# Patient Record
Sex: Female | Born: 1945
Health system: Southern US, Community
[De-identification: ages and names within clinical notes are randomized; demographics above are authoritative.]

## PROBLEM LIST (undated history)

## (undated) ENCOUNTER — Emergency Department (HOSPITAL_COMMUNITY): Admission: EM | Payer: Medicare Other

## (undated) DIAGNOSIS — C50919 Malignant neoplasm of unspecified site of unspecified female breast: Secondary | ICD-10-CM

## (undated) DIAGNOSIS — K589 Irritable bowel syndrome without diarrhea: Secondary | ICD-10-CM

## (undated) DIAGNOSIS — K219 Gastro-esophageal reflux disease without esophagitis: Secondary | ICD-10-CM

## (undated) DIAGNOSIS — R251 Tremor, unspecified: Secondary | ICD-10-CM

## (undated) DIAGNOSIS — E669 Obesity, unspecified: Secondary | ICD-10-CM

## (undated) DIAGNOSIS — C801 Malignant (primary) neoplasm, unspecified: Secondary | ICD-10-CM

## (undated) DIAGNOSIS — I10 Essential (primary) hypertension: Secondary | ICD-10-CM

## (undated) DIAGNOSIS — F419 Anxiety disorder, unspecified: Secondary | ICD-10-CM

## (undated) DIAGNOSIS — C8590 Non-Hodgkin lymphoma, unspecified, unspecified site: Secondary | ICD-10-CM

## (undated) DIAGNOSIS — I700 Atherosclerosis of aorta: Secondary | ICD-10-CM

## (undated) DIAGNOSIS — I428 Other cardiomyopathies: Secondary | ICD-10-CM

## (undated) DIAGNOSIS — E7800 Pure hypercholesterolemia, unspecified: Secondary | ICD-10-CM

## (undated) DIAGNOSIS — E785 Hyperlipidemia, unspecified: Secondary | ICD-10-CM

## (undated) HISTORY — DX: Hyperlipidemia, unspecified: E78.5

## (undated) HISTORY — DX: Pure hypercholesterolemia, unspecified: E78.00

## (undated) HISTORY — DX: Anxiety disorder, unspecified: F41.9

## (undated) HISTORY — DX: Obesity, unspecified: E66.9

## (undated) HISTORY — DX: Atherosclerosis of aorta: I70.0

## (undated) HISTORY — PX: COLONOSCOPY: SHX174

## (undated) HISTORY — DX: Morbid (severe) obesity due to excess calories: E66.01

## (undated) HISTORY — PX: ANKLE FRACTURE SURGERY: SHX122

## (undated) HISTORY — DX: Irritable bowel syndrome, unspecified: K58.9

## (undated) HISTORY — DX: Essential (primary) hypertension: I10

## (undated) HISTORY — DX: Gastro-esophageal reflux disease without esophagitis: K21.9

## (undated) HISTORY — DX: Malignant (primary) neoplasm, unspecified: C80.1

## (undated) HISTORY — PX: BREAST EXCISIONAL BIOPSY: SUR124

## (undated) HISTORY — PX: ABDOMINAL HYSTERECTOMY: SHX81

## (undated) HISTORY — DX: Tremor, unspecified: R25.1

## (undated) HISTORY — DX: Other cardiomyopathies: I42.8

---

## 1996-10-23 DIAGNOSIS — C50919 Malignant neoplasm of unspecified site of unspecified female breast: Secondary | ICD-10-CM

## 1996-10-23 HISTORY — PX: BREAST LUMPECTOMY: SHX2

## 1996-10-23 HISTORY — DX: Malignant neoplasm of unspecified site of unspecified female breast: C50.919

## 1998-01-22 ENCOUNTER — Encounter: Admission: RE | Admit: 1998-01-22 | Discharge: 1998-04-22 | Payer: Self-pay | Admitting: Radiation Oncology

## 1998-04-19 ENCOUNTER — Inpatient Hospital Stay (HOSPITAL_COMMUNITY): Admission: EM | Admit: 1998-04-19 | Discharge: 1998-04-21 | Payer: Self-pay | Admitting: Oncology

## 1998-05-19 ENCOUNTER — Inpatient Hospital Stay (HOSPITAL_COMMUNITY): Admission: EM | Admit: 1998-05-19 | Discharge: 1998-05-21 | Payer: Self-pay | Admitting: Oncology

## 1998-06-04 ENCOUNTER — Encounter: Admission: RE | Admit: 1998-06-04 | Discharge: 1998-09-02 | Payer: Self-pay | Admitting: Oncology

## 1999-08-18 ENCOUNTER — Ambulatory Visit (HOSPITAL_COMMUNITY): Admission: RE | Admit: 1999-08-18 | Discharge: 1999-08-18 | Payer: Self-pay | Admitting: Oncology

## 1999-08-18 ENCOUNTER — Encounter: Payer: Self-pay | Admitting: Oncology

## 2000-01-25 ENCOUNTER — Encounter: Payer: Self-pay | Admitting: Oncology

## 2000-01-25 ENCOUNTER — Encounter: Admission: RE | Admit: 2000-01-25 | Discharge: 2000-01-25 | Payer: Self-pay | Admitting: Oncology

## 2000-02-10 ENCOUNTER — Encounter: Admission: RE | Admit: 2000-02-10 | Discharge: 2000-02-10 | Payer: Self-pay | Admitting: Oncology

## 2000-02-10 ENCOUNTER — Encounter: Payer: Self-pay | Admitting: Oncology

## 2000-02-20 ENCOUNTER — Encounter: Admission: RE | Admit: 2000-02-20 | Discharge: 2000-02-20 | Payer: Self-pay | Admitting: Oncology

## 2000-03-08 ENCOUNTER — Encounter: Payer: Self-pay | Admitting: Gastroenterology

## 2000-03-08 ENCOUNTER — Encounter: Admission: RE | Admit: 2000-03-08 | Discharge: 2000-03-08 | Payer: Self-pay | Admitting: Gastroenterology

## 2000-08-14 ENCOUNTER — Other Ambulatory Visit: Admission: RE | Admit: 2000-08-14 | Discharge: 2000-08-14 | Payer: Self-pay | Admitting: Obstetrics and Gynecology

## 2000-11-20 ENCOUNTER — Ambulatory Visit (HOSPITAL_COMMUNITY): Admission: RE | Admit: 2000-11-20 | Discharge: 2000-11-20 | Payer: Self-pay | Admitting: Family Medicine

## 2000-11-20 ENCOUNTER — Encounter: Payer: Self-pay | Admitting: Family Medicine

## 2000-12-22 ENCOUNTER — Emergency Department (HOSPITAL_COMMUNITY): Admission: EM | Admit: 2000-12-22 | Discharge: 2000-12-22 | Payer: Self-pay | Admitting: Emergency Medicine

## 2000-12-23 ENCOUNTER — Emergency Department (HOSPITAL_COMMUNITY): Admission: EM | Admit: 2000-12-23 | Discharge: 2000-12-23 | Payer: Self-pay | Admitting: Emergency Medicine

## 2001-01-29 ENCOUNTER — Encounter: Admission: RE | Admit: 2001-01-29 | Discharge: 2001-01-29 | Payer: Self-pay | Admitting: Oncology

## 2001-01-29 ENCOUNTER — Encounter: Payer: Self-pay | Admitting: Oncology

## 2001-12-05 ENCOUNTER — Encounter: Payer: Self-pay | Admitting: Orthopedic Surgery

## 2001-12-05 ENCOUNTER — Ambulatory Visit (HOSPITAL_COMMUNITY): Admission: RE | Admit: 2001-12-05 | Discharge: 2001-12-05 | Payer: Self-pay | Admitting: Orthopedic Surgery

## 2002-01-30 ENCOUNTER — Encounter: Payer: Self-pay | Admitting: Oncology

## 2002-01-30 ENCOUNTER — Encounter: Admission: RE | Admit: 2002-01-30 | Discharge: 2002-01-30 | Payer: Self-pay | Admitting: Oncology

## 2002-06-26 ENCOUNTER — Emergency Department (HOSPITAL_COMMUNITY): Admission: EM | Admit: 2002-06-26 | Discharge: 2002-06-26 | Payer: Self-pay | Admitting: Emergency Medicine

## 2002-06-26 ENCOUNTER — Encounter: Payer: Self-pay | Admitting: Emergency Medicine

## 2003-02-04 ENCOUNTER — Encounter: Payer: Self-pay | Admitting: Oncology

## 2003-02-04 ENCOUNTER — Encounter: Admission: RE | Admit: 2003-02-04 | Discharge: 2003-02-04 | Payer: Self-pay | Admitting: Oncology

## 2003-05-05 ENCOUNTER — Ambulatory Visit (HOSPITAL_COMMUNITY): Admission: RE | Admit: 2003-05-05 | Discharge: 2003-05-05 | Payer: Self-pay | Admitting: Family Medicine

## 2003-05-05 ENCOUNTER — Encounter: Payer: Self-pay | Admitting: Family Medicine

## 2004-02-05 ENCOUNTER — Encounter: Admission: RE | Admit: 2004-02-05 | Discharge: 2004-02-05 | Payer: Self-pay | Admitting: Oncology

## 2004-11-21 ENCOUNTER — Ambulatory Visit (HOSPITAL_COMMUNITY): Admission: RE | Admit: 2004-11-21 | Discharge: 2004-11-21 | Payer: Self-pay | Admitting: Family Medicine

## 2005-02-08 ENCOUNTER — Encounter: Admission: RE | Admit: 2005-02-08 | Discharge: 2005-02-08 | Payer: Self-pay | Admitting: Family Medicine

## 2005-03-30 ENCOUNTER — Ambulatory Visit: Payer: Self-pay | Admitting: Oncology

## 2005-08-20 ENCOUNTER — Emergency Department (HOSPITAL_COMMUNITY): Admission: EM | Admit: 2005-08-20 | Discharge: 2005-08-20 | Payer: Self-pay | Admitting: Emergency Medicine

## 2005-10-11 ENCOUNTER — Ambulatory Visit (HOSPITAL_COMMUNITY): Admission: RE | Admit: 2005-10-11 | Discharge: 2005-10-11 | Payer: Self-pay | Admitting: Gastroenterology

## 2006-09-18 ENCOUNTER — Encounter: Admission: RE | Admit: 2006-09-18 | Discharge: 2006-09-18 | Payer: Self-pay | Admitting: General Surgery

## 2006-10-23 DIAGNOSIS — I428 Other cardiomyopathies: Secondary | ICD-10-CM

## 2006-10-23 HISTORY — DX: Other cardiomyopathies: I42.8

## 2007-03-29 HISTORY — PX: NM MYOVIEW LTD: HXRAD82

## 2007-09-25 ENCOUNTER — Ambulatory Visit (HOSPITAL_COMMUNITY): Admission: RE | Admit: 2007-09-25 | Discharge: 2007-09-25 | Payer: Self-pay | Admitting: Family Medicine

## 2007-12-22 HISTORY — PX: LEFT HEART CATH AND CORONARY ANGIOGRAPHY: CATH118249

## 2007-12-22 HISTORY — PX: CARDIAC CATHETERIZATION: SHX172

## 2008-01-16 ENCOUNTER — Ambulatory Visit (HOSPITAL_COMMUNITY): Admission: RE | Admit: 2008-01-16 | Discharge: 2008-01-16 | Payer: Self-pay | Admitting: Cardiology

## 2008-10-06 ENCOUNTER — Ambulatory Visit (HOSPITAL_COMMUNITY): Admission: RE | Admit: 2008-10-06 | Discharge: 2008-10-06 | Payer: Self-pay | Admitting: Family Medicine

## 2009-10-08 ENCOUNTER — Ambulatory Visit (HOSPITAL_COMMUNITY): Admission: RE | Admit: 2009-10-08 | Discharge: 2009-10-08 | Payer: Self-pay | Admitting: Family Medicine

## 2009-11-21 ENCOUNTER — Emergency Department (HOSPITAL_COMMUNITY): Admission: EM | Admit: 2009-11-21 | Discharge: 2009-11-21 | Payer: Self-pay | Admitting: Family Medicine

## 2010-06-23 ENCOUNTER — Emergency Department (HOSPITAL_COMMUNITY): Admission: EM | Admit: 2010-06-23 | Discharge: 2010-06-23 | Payer: Self-pay | Admitting: Emergency Medicine

## 2010-10-10 ENCOUNTER — Ambulatory Visit (HOSPITAL_COMMUNITY)
Admission: RE | Admit: 2010-10-10 | Discharge: 2010-10-10 | Payer: Self-pay | Source: Home / Self Care | Attending: Family Medicine | Admitting: Family Medicine

## 2010-11-13 ENCOUNTER — Encounter: Payer: Self-pay | Admitting: Family Medicine

## 2010-11-24 ENCOUNTER — Other Ambulatory Visit: Payer: Self-pay | Admitting: Otolaryngology

## 2010-11-24 ENCOUNTER — Ambulatory Visit
Admission: RE | Admit: 2010-11-24 | Discharge: 2010-11-24 | Disposition: A | Discharge status: Home / Self Care | Payer: 59 | Source: Ambulatory Visit | Attending: Otolaryngology | Admitting: Otolaryngology

## 2010-11-24 ENCOUNTER — Encounter (HOSPITAL_BASED_OUTPATIENT_CLINIC_OR_DEPARTMENT_OTHER)
Admission: RE | Admit: 2010-11-24 | Discharge: 2010-11-24 | Disposition: A | Discharge status: Home / Self Care | Payer: 59 | Source: Ambulatory Visit | Attending: Otolaryngology | Admitting: Otolaryngology

## 2010-11-24 DIAGNOSIS — Z01818 Encounter for other preprocedural examination: Secondary | ICD-10-CM

## 2010-11-24 DIAGNOSIS — Z01812 Encounter for preprocedural laboratory examination: Secondary | ICD-10-CM | POA: Insufficient documentation

## 2010-11-24 LAB — BASIC METABOLIC PANEL
BUN: 17 mg/dL (ref 6–23)
CO2: 29 mEq/L (ref 19–32)
Calcium: 8.9 mg/dL (ref 8.4–10.5)
Chloride: 101 mEq/L (ref 96–112)
Creatinine, Ser: 0.98 mg/dL (ref 0.4–1.2)
GFR calc Af Amer: >60 mL/min (ref >60)
Glucose, Bld: 93 mg/dL (ref 70–99)

## 2010-11-28 ENCOUNTER — Ambulatory Visit (HOSPITAL_BASED_OUTPATIENT_CLINIC_OR_DEPARTMENT_OTHER)
Admission: RE | Admit: 2010-11-28 | Discharge: 2010-11-29 | Disposition: A | Discharge status: Home / Self Care | Payer: 59 | Source: Ambulatory Visit | Attending: Otolaryngology | Admitting: Otolaryngology

## 2010-11-28 ENCOUNTER — Other Ambulatory Visit: Payer: Self-pay | Admitting: Otolaryngology

## 2010-11-28 DIAGNOSIS — F3289 Other specified depressive episodes: Secondary | ICD-10-CM | POA: Insufficient documentation

## 2010-11-28 DIAGNOSIS — C8291 Follicular lymphoma, unspecified, lymph nodes of head, face, and neck: Secondary | ICD-10-CM | POA: Insufficient documentation

## 2010-11-28 DIAGNOSIS — E669 Obesity, unspecified: Secondary | ICD-10-CM | POA: Insufficient documentation

## 2010-11-28 DIAGNOSIS — F329 Major depressive disorder, single episode, unspecified: Secondary | ICD-10-CM | POA: Insufficient documentation

## 2010-11-28 DIAGNOSIS — K219 Gastro-esophageal reflux disease without esophagitis: Secondary | ICD-10-CM | POA: Insufficient documentation

## 2010-11-28 DIAGNOSIS — I10 Essential (primary) hypertension: Secondary | ICD-10-CM | POA: Insufficient documentation

## 2010-12-05 NOTE — Op Note (Signed)
NAMEJADAE, Crystal [name]               ACCOUNT NO.:  [id number]  MEDICAL RECORD NO.:  [id number]            PATIENT TYPE:  LOCATION:                                 FACILITY:  PHYSICIAN:  Charlesa Ehle H. Pollyann Kennedy, MD          DATE OF BIRTH:  DATE OF PROCEDURE:  11/28/2010 DATE OF DISCHARGE:                              OPERATIVE REPORT   PREOPERATIVE DIAGNOSIS:  Right parotid mass.  POSTOPERATIVE DIAGNOSIS:  Right parotid mass.  PROCEDURE:  Right superficial parotidectomy with facial nerve dissection.  SURGEON:  Zykerria Tanton H. Pollyann Kennedy, MD  ASSISTANT:  Aquilla Hacker, [facility]  REFERRING PHYSICIAN:  L. Lupe Carney, MD  General endotracheal anesthesia was used.  No complications.  BLOOD LOSS:  Minimal.  FINDINGS:  Small firm mass in the midportion of the right parotid gland superficial lobe with surrounding parotid tissue and slight inflammatory reaction.  No complications.  HISTORY:  This is a 65 year old with a recently discovered mass in the right parotid that was getting larger.  Risks, benefits, alternatives, complications to the procedure were explained to the patient, seemed to understand, and agreed to surgery.  PROCEDURE:  The patient was taken to the operating room, placed on the operating table in supine position.  Following induction of general endotracheal anesthesia, the right face was prepped and draped in a standard fashion.  A preauricular incision was outlined with marking pen with extension down around the lobule and around the mastoid tip.  The electrocautery was used to incise the skin and subcutaneous tissue.  As the flap was brought forward, the lateral branch of the greater auricular nerve was identified and preserved and reflected laterally. Superficial flap was developed anteriorly and posteriorly, the earlobe was secured back to the drapes using a silk suture.  The parotid tissue was dissected anteriorly off the upper sternocleidomastoid muscle. Dissection now  anterior to the ear canal was then accomplished.  The main trunk of the facial nerve was identified in its standard position once the digastric muscle was exposed.  The nerve was then dissected out peripherally starting with the superior division and then working towards the inferior division.  The harmonic scalpel was used to divide the parotid tissue after exposing the each individual facial nerve branch.  A complete dissection was accomplished keeping the tumor within the specimen.  The specimen was delivered and sent for pathologic evaluation.  The wound was irrigated with saline.  Hemostasis was completed with 4-0 silk ties as needed and bipolar cautery.  A battery- operated nerve stimulator was used on the main trunk in the upper and lower divisions and there was excellent mobility at a small setting. The wound was then closed in layers using 4-0 chromic on the subcuticular layer and Dermabond on the skin.  A 7-French round JP drain was left in the wound, exited through the inferior aspect of the incision and secured in place with a nylon suture.  The patient was awakened, extubated, and transferred to recovery in stable condition.     Rosealee Recinos H. Pollyann Kennedy, MD     JHR/MEDQ  D:  11/28/2010  T:  11/29/2010  Job:  [id number]  cc:   L. Lupe Carney, M.D.  Electronically Signed by Serena Colonel MD on 12/05/2010 09:36:25 PM

## 2010-12-09 ENCOUNTER — Ambulatory Visit (HOSPITAL_COMMUNITY): Admission: RE | Admit: 2010-12-09 | Payer: 59 | Source: Ambulatory Visit | Admitting: Otolaryngology

## 2010-12-22 ENCOUNTER — Other Ambulatory Visit: Payer: Self-pay | Admitting: Oncology

## 2010-12-22 ENCOUNTER — Encounter (HOSPITAL_BASED_OUTPATIENT_CLINIC_OR_DEPARTMENT_OTHER): Payer: 59 | Admitting: Oncology

## 2010-12-22 DIAGNOSIS — C8291 Follicular lymphoma, unspecified, lymph nodes of head, face, and neck: Secondary | ICD-10-CM

## 2010-12-22 DIAGNOSIS — C8581 Other specified types of non-Hodgkin lymphoma, lymph nodes of head, face, and neck: Secondary | ICD-10-CM

## 2010-12-22 DIAGNOSIS — C50919 Malignant neoplasm of unspecified site of unspecified female breast: Secondary | ICD-10-CM

## 2010-12-22 DIAGNOSIS — C801 Malignant (primary) neoplasm, unspecified: Secondary | ICD-10-CM

## 2010-12-22 DIAGNOSIS — Z170 Estrogen receptor positive status [ER+]: Secondary | ICD-10-CM

## 2010-12-22 DIAGNOSIS — C8290 Follicular lymphoma, unspecified, unspecified site: Secondary | ICD-10-CM

## 2010-12-22 HISTORY — DX: Malignant (primary) neoplasm, unspecified: C80.1

## 2010-12-22 LAB — COMPREHENSIVE METABOLIC PANEL
ALT: 15 U/L (ref 0–35)
AST: 23 U/L (ref 0–37)
BUN: 15 mg/dL (ref 6–23)
Calcium: 9 mg/dL (ref 8.4–10.5)
Chloride: 105 mEq/L (ref 96–112)
Creatinine, Ser: 1.09 mg/dL (ref 0.40–1.20)
Total Bilirubin: 0.4 mg/dL (ref 0.3–1.2)

## 2010-12-22 LAB — CBC WITH DIFFERENTIAL/PLATELET
BASO%: 0.5 % (ref 0.0–2.0)
EOS%: 2.4 % (ref 0.0–7.0)
HCT: 40.9 % (ref 34.8–46.6)
LYMPH%: 36.2 % (ref 14.0–49.7)
MCH: 27 pg (ref 25.1–34.0)
MCHC: 32.3 g/dL (ref 31.5–36.0)
MCV: 83.6 fL (ref 79.5–101.0)
MONO%: 6.8 % (ref 0.0–14.0)
NEUT%: 54.1 % (ref 38.4–76.8)
Platelets: ADEQUATE 10*3/uL (ref 145–400)
RBC: 4.89 10*6/uL (ref 3.70–5.45)
WBC: 6.2 10*3/uL (ref 3.9–10.3)
nRBC: 0 % (ref 0–0)

## 2010-12-23 LAB — BETA 2 MICROGLOBULIN, SERUM: Beta-2 Microglobulin: 1.94 mg/L — ABNORMAL HIGH (ref 1.01–1.73)

## 2011-01-03 ENCOUNTER — Encounter (HOSPITAL_COMMUNITY): Payer: Self-pay

## 2011-01-03 ENCOUNTER — Ambulatory Visit (HOSPITAL_COMMUNITY)
Admission: RE | Admit: 2011-01-03 | Discharge: 2011-01-03 | Disposition: A | Discharge status: Home / Self Care | Payer: 59 | Source: Ambulatory Visit | Attending: Oncology | Admitting: Oncology

## 2011-01-03 DIAGNOSIS — C8290 Follicular lymphoma, unspecified, unspecified site: Secondary | ICD-10-CM

## 2011-01-03 DIAGNOSIS — R220 Localized swelling, mass and lump, head: Secondary | ICD-10-CM | POA: Insufficient documentation

## 2011-01-03 DIAGNOSIS — R221 Localized swelling, mass and lump, neck: Secondary | ICD-10-CM | POA: Insufficient documentation

## 2011-01-03 DIAGNOSIS — C8588 Other specified types of non-Hodgkin lymphoma, lymph nodes of multiple sites: Secondary | ICD-10-CM | POA: Insufficient documentation

## 2011-01-03 DIAGNOSIS — Z853 Personal history of malignant neoplasm of breast: Secondary | ICD-10-CM | POA: Insufficient documentation

## 2011-01-03 HISTORY — DX: Malignant neoplasm of unspecified site of unspecified female breast: C50.919

## 2011-01-03 HISTORY — DX: Non-Hodgkin lymphoma, unspecified, unspecified site: C85.90

## 2011-01-03 MED ORDER — IOHEXOL 300 MG/ML  SOLN
125.0000 mL | Freq: Once | INTRAMUSCULAR | Status: AC | PRN
  Administered 2011-01-03: 125 mL via INTRAVENOUS

## 2011-01-05 LAB — BASIC METABOLIC PANEL
CO2: 28 mEq/L (ref 19–32)
Calcium: 9.4 mg/dL (ref 8.4–10.5)
GFR calc Af Amer: 50 mL/min — ABNORMAL LOW (ref >60)
GFR calc non Af Amer: 41 mL/min — ABNORMAL LOW (ref >60)
Glucose, Bld: 103 mg/dL — ABNORMAL HIGH (ref 70–99)
Potassium: 3.3 mEq/L — ABNORMAL LOW (ref 3.5–5.1)
Sodium: 144 mEq/L (ref 135–145)

## 2011-01-05 LAB — DIFFERENTIAL
Lymphs Abs: 2.5 10*3/uL (ref 0.7–4.0)
Monocytes Absolute: 0.5 10*3/uL (ref 0.1–1.0)
Monocytes Relative: 7 % (ref 3–12)
Neutro Abs: 3.5 10*3/uL (ref 1.7–7.7)
Neutrophils Relative %: 54 % (ref 43–77)

## 2011-01-05 LAB — CBC
HCT: 38.1 % (ref 36.0–46.0)
Hemoglobin: 12.8 g/dL (ref 12.0–15.0)
RBC: 4.46 MIL/uL (ref 3.87–5.11)

## 2011-01-05 LAB — URINALYSIS, ROUTINE W REFLEX MICROSCOPIC
Bilirubin Urine: NEGATIVE
Glucose, UA: NEGATIVE mg/dL
Hgb urine dipstick: NEGATIVE
Specific Gravity, Urine: 1.012 (ref 1.005–1.030)
pH: 7 (ref 5.0–8.0)

## 2011-01-06 ENCOUNTER — Other Ambulatory Visit: Payer: Self-pay | Admitting: Oncology

## 2011-01-06 ENCOUNTER — Encounter (HOSPITAL_BASED_OUTPATIENT_CLINIC_OR_DEPARTMENT_OTHER): Payer: 59 | Admitting: Oncology

## 2011-01-06 DIAGNOSIS — C8581 Other specified types of non-Hodgkin lymphoma, lymph nodes of head, face, and neck: Secondary | ICD-10-CM

## 2011-01-06 DIAGNOSIS — C50919 Malignant neoplasm of unspecified site of unspecified female breast: Secondary | ICD-10-CM

## 2011-01-06 DIAGNOSIS — C8590 Non-Hodgkin lymphoma, unspecified, unspecified site: Secondary | ICD-10-CM

## 2011-01-06 DIAGNOSIS — Z170 Estrogen receptor positive status [ER+]: Secondary | ICD-10-CM

## 2011-01-06 DIAGNOSIS — C8589 Other specified types of non-Hodgkin lymphoma, extranodal and solid organ sites: Secondary | ICD-10-CM

## 2011-01-11 ENCOUNTER — Encounter (HOSPITAL_COMMUNITY)
Admission: RE | Admit: 2011-01-11 | Discharge: 2011-01-11 | Disposition: A | Discharge status: Home / Self Care | Payer: 59 | Source: Ambulatory Visit | Attending: Oncology | Admitting: Oncology

## 2011-01-11 ENCOUNTER — Encounter (HOSPITAL_COMMUNITY): Payer: Self-pay

## 2011-01-11 DIAGNOSIS — C8589 Other specified types of non-Hodgkin lymphoma, extranodal and solid organ sites: Secondary | ICD-10-CM | POA: Insufficient documentation

## 2011-01-11 DIAGNOSIS — C8590 Non-Hodgkin lymphoma, unspecified, unspecified site: Secondary | ICD-10-CM

## 2011-01-11 LAB — GLUCOSE, CAPILLARY: Glucose-Capillary: 93 mg/dL (ref 70–99)

## 2011-01-11 MED ORDER — FLUDEOXYGLUCOSE F - 18 (FDG) INJECTION
17.7000 | Freq: Once | INTRAVENOUS | Status: AC | PRN
  Administered 2011-01-11: 17.7 via INTRAVENOUS

## 2011-01-20 ENCOUNTER — Encounter (HOSPITAL_BASED_OUTPATIENT_CLINIC_OR_DEPARTMENT_OTHER): Payer: 59 | Admitting: Oncology

## 2011-01-20 DIAGNOSIS — Z170 Estrogen receptor positive status [ER+]: Secondary | ICD-10-CM

## 2011-01-20 DIAGNOSIS — C8581 Other specified types of non-Hodgkin lymphoma, lymph nodes of head, face, and neck: Secondary | ICD-10-CM

## 2011-01-20 DIAGNOSIS — C50919 Malignant neoplasm of unspecified site of unspecified female breast: Secondary | ICD-10-CM

## 2011-01-20 DIAGNOSIS — C8589 Other specified types of non-Hodgkin lymphoma, extranodal and solid organ sites: Secondary | ICD-10-CM

## 2011-03-07 NOTE — Cardiovascular Report (Signed)
[name], [name]               ACCOUNT NO.:  [id number]   MEDICAL RECORD NO.:  [id number]          PATIENT TYPE:  OIB   LOCATION:  2899                         FACILITY:  [facility]   PHYSICIAN:  Thereasa Solo. Little, M.D. DATE OF BIRTH:  04-14-1946   DATE OF PROCEDURE:  01/16/2008  DATE OF DISCHARGE:                            CARDIAC CATHETERIZATION   INDICATION FOR TEST:  This is a 65 year old female who has a  cardiomyopathy with an ejection fraction of 33% by nuclear study in July  2008.  She had a history of breast cancer, treated with Adriamycin and  the assumption has been made that her EF is Adriamycin-induced.   She has been having episodes of chest pressure that has gotten  progressively worse over the last 3 days.  It occurs with any type of  mild activity and if she stops it will gradually dissipate.  She has had  breathlessness with this, has had no pain in her calves and no lower  extremity edema.   After obtaining informed consent, the patient was prepped and draped in  the usual sterile fashion, exposing the right groin.  Applying local  anesthetic with 1% Xylocaine, the Seldinger technique was employed and a  5-French introducer sheath was placed into the right femoral artery.  Left and right coronary arteriography and ventriculography in the RAO  projection and an aortic root was performed.   COMPLICATIONS:  None.   EQUIPMENT:  A 5-French __________ configuration catheters.  A guide wire  had to be used in order to position the 4-cm curve left Judkins catheter  into the ostium of the root.  The aortic root appeared to be dilated and  because of this an aortic root injection was performed.   TOTAL CONTRAST:  100 mL.   RESULTS:   HEMODYNAMIC MONITORING:  1. Central aortic pressure was 139/85.  2. Left ventricular pressure was 135/6 and there was no aortic valve      gradient at the time of pullback.   VENTRICULOGRAPHY:  Ventriculography in the RAO projection  using 25 mL of  contrast at 12 cc/sec revealed good opacification of the left ventricle.  Ventricular ectopy was noted during the ventriculogram.  The apex and  the inferior wall was mildly hypokinetic.  There was no mitral  regurgitation.  The ejection fraction was greater than 45%.   AORTIC ROOT:  Aortic root injection in the LAO cranial position showed  that aortic root to be uniformly dilated.  It was about 3.5-to-4-cm in  diameter with no evidence of a dissection flap.   CORONARY ARTERIOGRAPHY:  1. Left main.  Normal it bifurcated.  2. Circumflex.  The circumflex gave rise to two OM vessels both of      which were free of disease.  3. LAD.  The LAD extended down to the apex of the heart and as it      approached the apex of the heart it bifurcated.  There was a large      first diagonal branch.  This entire system was free of disease.  4. Right coronary artery.  The right coronary artery was a large      dominant vessel at about 4.5-mm.  It gave rise to a PDA and four      large posterolateral branches, all of which were free of disease.      The PDA actually crossed the apex of the heart.   CONCLUSION:  1. No evidence of coronary disease.  2. Mild dilatation of the aortic root.  3. Mild left ventricular dysfunction with an ejection fraction of      approximately 45-50% with mild hypokinesis of the apex and inferior      wall.   I cannot explain her symptoms based on her cardiac cath.  In addition to  this, her LV function has improved considerably probably as a result of  being on ARBs.   The aortic root dilatation is somewhat worrisome.  A CT scan of her  chest as an outpatient will be indicated, but I do not think that the  dilatation of aortic root is an acute event and I do not think it is  responsible for her chest pain.           ______________________________  Thereasa Solo. Little, M.D.     ABL/MEDQ  D:  01/16/2008  T:  01/16/2008  Job:  [id number]   cc:   Bryan Lemma. Manus Gunning, M.D.  Cath Lab

## 2011-03-10 NOTE — Op Note (Signed)
NAMEFONDA, ROCHON               ACCOUNT NO.:  [id number]   MEDICAL RECORD NO.:  [id number]          PATIENT TYPE:  AMB   LOCATION:  ENDO                         FACILITY:  [facility]   PHYSICIAN:  James L. Malon Kindle., M.D.DATE OF BIRTH:  [birth year]   DATE OF PROCEDURE:  10/11/2005  DATE OF DISCHARGE:  10/11/2005                                 OPERATIVE REPORT   PROCEDURE:  Colonoscopy.   ENDOSCOPIST:  Llana Aliment. Edwards, M.D.   MEDICATIONS:  This patient received a total of fentanyl 100 mcg and Versed  12 mg for both procedures.   SCOPE:  Pediatric adjustable colonoscope.   INDICATION:  The patient had recent mesenteric adenitis on CT scan, treated  with antibiotics; a cause was never clear.  She felt much better with the  antibiotics.  This is done to rule out any lesion of the colon such as  diverticulitis, cancer, colitis, etc, that could be contributing to this.   DESCRIPTION OF PROCEDURE:  The procedure had been explained to the patient  and consent obtained.  With the patient in the left lateral decubitus  position, the Olympus pediatric adjustable scope was inserted following a  digital rectal exam and advanced under direct visualization.  The prep was  quite good.  The patient had an extremely long tortuous colon and multiple  maneuvers including position changes in the supine and right lateral  decubitus position were required and eventually we were able to reach the  level of the ileocecal valve; the cecum could be seen in the distance; all  was normal.  The scope was withdrawn and the ascending colon, transverse,  descending and sigmoid colon were seen well and no colitis involving the  mucosal pattern, no masses, polyps, etc, no diverticular disease.  The scope  was withdrawn into the rectum and the rectum was normal.  Scope was  withdrawn.  The patient tolerated the procedure well.   ASSESSMENT:  1.  Abnormal CT scan with negative colonoscopy at this time, with no  obvious      cause for her mesenteric adenitis.  2.  No colonic neoplasia.   PLAN:  We will see her back in the office in 3 or 4 months and see how she  is doing.  I would recommend routine colon screening followup with yearly  Hemoccults and possibly a colonoscopy in 10 years.           ______________________________  Llana Aliment Malon Kindle., M.D.     Waldron Session  D:  10/11/2005  T:  10/13/2005  Job:  [id number]   cc:   Bryan Lemma. Manus Gunning, M.D.  Fax: [phone]

## 2011-03-10 NOTE — Op Note (Signed)
NAMEJESSIKAH, DICKER               ACCOUNT NO.:  [id number]   MEDICAL RECORD NO.:  [id number]          PATIENT TYPE:  AMB   LOCATION:  ENDO                         FACILITY:  [facility]   PHYSICIAN:  James L. Malon Kindle., M.D.DATE OF BIRTH:  [birth year]   DATE OF PROCEDURE:  10/11/2005  DATE OF DISCHARGE:  10/11/2005                                 OPERATIVE REPORT   PROCEDURE:  Esophagogastroduodenoscopy.   MEDICATIONS:  Cetacaine spray, fentanyl 70 mcg, Versed 7.5 mg IV.   INDICATIONS:  Esophageal reflux, persistent.   PROCEDURE:  The procedure was explained to the patient and consent was  obtained.  In the left lateral decubitus position, the Olympus endoscope was  inserted blindly in the esophagus, advanced to the stomach.  The pyloric  channel was identified and passed. The duodenum including the bulb and  second portion were seen well and were unremarkable.  The antrum and body of  the stomach were seen well and were normal.  The fundus and cardia were seen  well on retroflexed view and were normal.  The distal and proximal esophagus  were endoscopically normal.  There was free reflux, a widely patent GE  junction.  The proximal esophagus was normal.  The scope was withdrawn.  The  patient tolerated the procedure well.   ASSESSMENT:  Gastroesophageal reflux disease, 530.81.   PLAN:  Will give a reflux instruction sheet and treat with PPI of choice.  Will proceed with colonoscopy at this time.           ______________________________  Llana Aliment Malon Kindle., M.D.     Waldron Session  D:  10/11/2005  T:  10/13/2005  Job:  [id number]   cc:   Silvestre Moment  Fax: [phone]

## 2011-04-14 ENCOUNTER — Encounter (HOSPITAL_BASED_OUTPATIENT_CLINIC_OR_DEPARTMENT_OTHER): Payer: 59 | Admitting: Oncology

## 2011-04-14 DIAGNOSIS — C8581 Other specified types of non-Hodgkin lymphoma, lymph nodes of head, face, and neck: Secondary | ICD-10-CM

## 2011-04-14 DIAGNOSIS — Z853 Personal history of malignant neoplasm of breast: Secondary | ICD-10-CM

## 2011-07-17 LAB — CBC
MCHC: 33.9
MCV: 87.7
Platelets: 207
WBC: 4.2

## 2011-07-17 LAB — PROTIME-INR: Prothrombin Time: 12.8

## 2011-07-17 LAB — BASIC METABOLIC PANEL
BUN: 10
CO2: 24
Chloride: 108
Creatinine, Ser: 0.81

## 2011-08-31 ENCOUNTER — Telehealth: Payer: Self-pay | Admitting: *Deleted

## 2011-08-31 NOTE — Telephone Encounter (Signed)
Dentist says she needs cap/crown work. Does she need to see Dr. Truett Perna prior? Called back and left her VM that she is not under active treatment and last counts were OK--does not need to see Dr. Truett Perna for dental work.

## 2011-09-05 ENCOUNTER — Other Ambulatory Visit (HOSPITAL_COMMUNITY): Payer: Self-pay | Admitting: Family Medicine

## 2011-09-05 DIAGNOSIS — Z1231 Encounter for screening mammogram for malignant neoplasm of breast: Secondary | ICD-10-CM

## 2011-09-21 ENCOUNTER — Telehealth: Payer: Self-pay | Admitting: Oncology

## 2011-09-21 NOTE — Telephone Encounter (Signed)
Pt came in today to r/s 10/29 appt. Pt given new appt for 1/17.

## 2011-10-12 ENCOUNTER — Ambulatory Visit (HOSPITAL_COMMUNITY)
Admission: RE | Admit: 2011-10-12 | Discharge: 2011-10-12 | Disposition: A | Discharge status: Home / Self Care | Payer: 59 | Source: Ambulatory Visit | Attending: Family Medicine | Admitting: Family Medicine

## 2011-10-12 DIAGNOSIS — Z1231 Encounter for screening mammogram for malignant neoplasm of breast: Secondary | ICD-10-CM

## 2011-11-08 ENCOUNTER — Encounter: Payer: Self-pay | Admitting: *Deleted

## 2011-11-09 ENCOUNTER — Ambulatory Visit (HOSPITAL_BASED_OUTPATIENT_CLINIC_OR_DEPARTMENT_OTHER): Payer: 59 | Admitting: Oncology

## 2011-11-09 VITALS — BP 114/75 | HR 98 | Temp 97.4°F | Ht 63.0 in | Wt 236.2 lb

## 2011-11-09 DIAGNOSIS — C8589 Other specified types of non-Hodgkin lymphoma, extranodal and solid organ sites: Secondary | ICD-10-CM

## 2011-11-09 DIAGNOSIS — Z853 Personal history of malignant neoplasm of breast: Secondary | ICD-10-CM

## 2011-11-09 NOTE — Progress Notes (Signed)
OFFICE PROGRESS NOTE   INTERVAL HISTORY:   She missed a scheduled appointment in 2023-09-07. Her husband passed away several months ago. She reports feeling well. She continues to work. She denies fever, night sweats, and anorexia. The only palpable lymph node is a small left posterior cervical node.  Objective:  Vital signs in last 24 hours:  Blood pressure 114/75, pulse 98, temperature 97.4 F (36.3 C), temperature source Oral, height 5\' 3"  (1.6 m), weight 236 lb 3.2 oz (107.14 kg).    HEENT: Neck without mass Lymphatics: Soft mobile 1/2-1 cm left posterior cervical node. No other palpable cervical, supraclavicular, axillary, or inguinal nodes Resp: Lungs with a few end inspiratory rhonchi at the right posterior base. No respiratory distress. Cardio: Regular rate and rhythm GI: No hepatosplenomegaly Vascular: No leg edema      Medications: I have reviewed the patient's current medications.  Assessment/Plan: 1. Low-grade follicular lymphoma involving a right parotid mass, status post an excisional biopsy on 11/28/2010. a. Staging CT scans 01/03/2011 confirmed an increased number of small nodes in the neck, left axilla and pelvis without clear evidence of pathologic lymphadenopathy. b. PET scan 01/11/2011 confirmed hypermetabolic lymph nodes in the right cervical chain, left axillary nodes, periaortic, common iliac, external iliac and inguinal nodes.  There was also a possible area of involvement at the right tonsillar region. 2. Stage I right-sided breast cancer diagnosed in 1998. 3. History of congestive heart failure. 4.     Hypertension   Disposition:  She appears asymptomatic from the non-Hodgkin's lymphoma. There is no clinical evidence of disease progression. She would like to continue an observation approach. She will return for an office visit in 9 months. She will contact us in the interim for enlarging lymph nodes or new symptoms.   Lucile Shutters,  MD  11/09/2011  6:05 PM

## 2011-12-08 ENCOUNTER — Ambulatory Visit
Admission: RE | Admit: 2011-12-08 | Discharge: 2011-12-08 | Disposition: A | Discharge status: Home / Self Care | Payer: 59 | Source: Ambulatory Visit | Attending: Family Medicine | Admitting: Family Medicine

## 2011-12-08 ENCOUNTER — Other Ambulatory Visit: Payer: Self-pay | Admitting: Family Medicine

## 2011-12-08 DIAGNOSIS — R0602 Shortness of breath: Secondary | ICD-10-CM

## 2011-12-21 ENCOUNTER — Encounter: Payer: Self-pay | Admitting: Oncology

## 2011-12-21 NOTE — Progress Notes (Signed)
Put fmla forms on nurse's desk. °

## 2012-01-01 ENCOUNTER — Encounter: Payer: Self-pay | Admitting: Oncology

## 2012-01-01 NOTE — Progress Notes (Signed)
Put fmla forms in registration desk. °

## 2012-02-13 ENCOUNTER — Telehealth: Payer: Self-pay | Admitting: *Deleted

## 2012-02-13 NOTE — Telephone Encounter (Signed)
Will be losing her health insurance with [facility] in June or July. Asking if MD would see her before then instead of her October follow up?

## 2012-02-14 ENCOUNTER — Other Ambulatory Visit: Payer: Self-pay | Admitting: *Deleted

## 2012-02-15 ENCOUNTER — Telehealth: Payer: Self-pay | Admitting: Oncology

## 2012-02-15 NOTE — Telephone Encounter (Signed)
Called pt lmovm to rtn call to r/s appt in oct to [id number]

## 2012-02-15 NOTE — Telephone Encounter (Signed)
pt rtn call and r/s oct 2013 appt to 5-14

## 2012-03-05 ENCOUNTER — Ambulatory Visit (HOSPITAL_BASED_OUTPATIENT_CLINIC_OR_DEPARTMENT_OTHER): Payer: 59 | Admitting: Oncology

## 2012-03-05 ENCOUNTER — Other Ambulatory Visit: Payer: Self-pay | Admitting: *Deleted

## 2012-03-05 ENCOUNTER — Telehealth: Payer: Self-pay | Admitting: Oncology

## 2012-03-05 VITALS — BP 114/83 | HR 113 | Temp 97.1°F | Ht 63.0 in | Wt 234.5 lb

## 2012-03-05 DIAGNOSIS — C8589 Other specified types of non-Hodgkin lymphoma, extranodal and solid organ sites: Secondary | ICD-10-CM

## 2012-03-05 DIAGNOSIS — Z853 Personal history of malignant neoplasm of breast: Secondary | ICD-10-CM

## 2012-03-05 DIAGNOSIS — I10 Essential (primary) hypertension: Secondary | ICD-10-CM

## 2012-03-05 DIAGNOSIS — C8291 Follicular lymphoma, unspecified, lymph nodes of head, face, and neck: Secondary | ICD-10-CM

## 2012-03-05 NOTE — Progress Notes (Signed)
   [facility]    OFFICE PROGRESS NOTE   INTERVAL HISTORY:   She returns prior to a scheduled visit. She will be losing her insurance and wanted to come earlier. She denies fever, night sweats, and anorexia. The small lymph node at the left upper neck is unchanged. She has intermittent hot flashes. Her sister was recently diagnosed with breast cancer. She reports her sister will be seen at the genetics clinic.  Objective:  Vital signs in last 24 hours:  Blood pressure 114/83, pulse 113, temperature 97.1 F (36.2 C), temperature source Oral, height 5\' 3"  (1.6 m), weight 234 lb 8 oz (106.369 kg).    HEENT:  Neck without mass Lymphatics: No discreet cervical, supraclavicular, axillary, or inguinal nodes. Slight fullness at the left upper cervical region posterior to the submandibular gland and near the carotid pulse. I cannot palpate a discrete lymph node. Resp: Inspiratory rhonchi at the posterior base bilaterally, no respiratory distress Cardio: Regular rate and rhythm GI: No hepatosplenomegaly Vascular: No leg edema    Medications: I have reviewed the patient's current medications.  Assessment/Plan: 1. Low-grade follicular lymphoma involving a right parotid mass, status post an excisional biopsy on 11/28/2010. a. Staging CT scans 01/03/2011 confirmed an increased number of small nodes in the neck, left axilla and pelvis without clear evidence of pathologic lymphadenopathy. b. PET scan 01/11/2011 confirmed hypermetabolic lymph nodes in the right cervical chain, left axillary nodes, periaortic, common iliac, external iliac and inguinal nodes. There was also a possible area of involvement at the right tonsillar region. 2. Stage I right-sided breast cancer diagnosed in 1998. 3. History of congestive heart failure. 4. Hypertension    Disposition:  She remains asymptomatic from the non-Hodgkin's lymphoma. She will return for an office visit in 8 months. Ms. Duman  will contact us in the interim for new symptoms or palpable lymphadenopathy.   Thornton Papas, MD  03/05/2012  3:41 PM

## 2012-03-05 NOTE — Telephone Encounter (Signed)
appts made and printed for pt aom °

## 2012-07-18 DIAGNOSIS — Z23 Encounter for immunization: Secondary | ICD-10-CM | POA: Diagnosis not present

## 2012-08-08 ENCOUNTER — Ambulatory Visit: Payer: 59 | Admitting: Oncology

## 2012-08-20 ENCOUNTER — Other Ambulatory Visit: Payer: Self-pay | Admitting: Family Medicine

## 2012-08-20 DIAGNOSIS — Z1231 Encounter for screening mammogram for malignant neoplasm of breast: Secondary | ICD-10-CM

## 2012-09-05 DIAGNOSIS — Z79899 Other long term (current) drug therapy: Secondary | ICD-10-CM | POA: Diagnosis not present

## 2012-09-05 DIAGNOSIS — E7800 Pure hypercholesterolemia, unspecified: Secondary | ICD-10-CM | POA: Diagnosis not present

## 2012-09-05 DIAGNOSIS — N183 Chronic kidney disease, stage 3 unspecified: Secondary | ICD-10-CM | POA: Diagnosis not present

## 2012-09-05 DIAGNOSIS — I10 Essential (primary) hypertension: Secondary | ICD-10-CM | POA: Diagnosis not present

## 2012-09-05 DIAGNOSIS — G479 Sleep disorder, unspecified: Secondary | ICD-10-CM | POA: Diagnosis not present

## 2012-09-05 DIAGNOSIS — F411 Generalized anxiety disorder: Secondary | ICD-10-CM | POA: Diagnosis not present

## 2012-09-05 DIAGNOSIS — K219 Gastro-esophageal reflux disease without esophagitis: Secondary | ICD-10-CM | POA: Diagnosis not present

## 2012-10-21 ENCOUNTER — Ambulatory Visit
Admission: RE | Admit: 2012-10-21 | Discharge: 2012-10-21 | Disposition: A | Discharge status: Home / Self Care | Payer: Medicare Other | Source: Ambulatory Visit | Attending: Family Medicine | Admitting: Family Medicine

## 2012-10-21 DIAGNOSIS — Z1231 Encounter for screening mammogram for malignant neoplasm of breast: Secondary | ICD-10-CM | POA: Diagnosis not present

## 2012-10-23 HISTORY — PX: DOPPLER ECHOCARDIOGRAPHY: SHX263

## 2012-10-24 ENCOUNTER — Telehealth: Payer: Self-pay | Admitting: Oncology

## 2012-10-24 DIAGNOSIS — I428 Other cardiomyopathies: Secondary | ICD-10-CM | POA: Diagnosis not present

## 2012-10-24 DIAGNOSIS — I10 Essential (primary) hypertension: Secondary | ICD-10-CM | POA: Diagnosis not present

## 2012-10-24 NOTE — Telephone Encounter (Signed)
Pt came in and needed to change appt due to helping sister.Crystal KitchenMarland KitchenMarland KitchenDone

## 2012-10-25 ENCOUNTER — Other Ambulatory Visit (HOSPITAL_COMMUNITY): Payer: Self-pay | Admitting: Cardiology

## 2012-10-25 DIAGNOSIS — R0602 Shortness of breath: Secondary | ICD-10-CM

## 2012-10-25 DIAGNOSIS — R0600 Dyspnea, unspecified: Secondary | ICD-10-CM

## 2012-11-04 ENCOUNTER — Ambulatory Visit: Payer: 59 | Admitting: Oncology

## 2012-11-15 ENCOUNTER — Ambulatory Visit (HOSPITAL_BASED_OUTPATIENT_CLINIC_OR_DEPARTMENT_OTHER): Payer: Medicare Other | Admitting: Oncology

## 2012-11-15 ENCOUNTER — Telehealth: Payer: Self-pay | Admitting: Oncology

## 2012-11-15 ENCOUNTER — Ambulatory Visit (HOSPITAL_COMMUNITY)
Admission: RE | Admit: 2012-11-15 | Discharge: 2012-11-15 | Disposition: A | Discharge status: Home / Self Care | Payer: Medicare Other | Source: Ambulatory Visit | Attending: Cardiology | Admitting: Cardiology

## 2012-11-15 VITALS — BP 131/87 | HR 73 | Temp 97.5°F | Resp 20 | Ht 63.0 in | Wt 241.8 lb

## 2012-11-15 DIAGNOSIS — R0602 Shortness of breath: Secondary | ICD-10-CM | POA: Diagnosis not present

## 2012-11-15 DIAGNOSIS — C8299 Follicular lymphoma, unspecified, extranodal and solid organ sites: Secondary | ICD-10-CM | POA: Diagnosis not present

## 2012-11-15 DIAGNOSIS — I369 Nonrheumatic tricuspid valve disorder, unspecified: Secondary | ICD-10-CM | POA: Insufficient documentation

## 2012-11-15 DIAGNOSIS — I379 Nonrheumatic pulmonary valve disorder, unspecified: Secondary | ICD-10-CM | POA: Insufficient documentation

## 2012-11-15 DIAGNOSIS — R0600 Dyspnea, unspecified: Secondary | ICD-10-CM

## 2012-11-15 DIAGNOSIS — I10 Essential (primary) hypertension: Secondary | ICD-10-CM | POA: Insufficient documentation

## 2012-11-15 DIAGNOSIS — C8589 Other specified types of non-Hodgkin lymphoma, extranodal and solid organ sites: Secondary | ICD-10-CM

## 2012-11-15 NOTE — Progress Notes (Signed)
[facility]   2D echo completed 11/15/2012.   Cindy Shaneen Reeser, RDCS   

## 2012-11-15 NOTE — Progress Notes (Signed)
   [facility]    OFFICE PROGRESS NOTE   INTERVAL HISTORY:   She returns as scheduled. No palpable lymph nodes. Good appetite. She reports exertional dyspnea. She has been evaluated by cardiology and is scheduled for an echocardiogram later today. She complains of fecal urgency and intermittent right abdomen discomfort. This has been present for months. No fever. No change over the breast. A bilateral mammogram on 10/21/2012 was negative.  Objective:  Vital signs in last 24 hours:  Blood pressure 131/87, pulse 73, temperature 97.5 F (36.4 C), temperature source Oral, resp. rate 20, height 5\' 3"  (1.6 m), weight 241 lb 12.8 oz (109.68 kg).    HEENT: Oropharynx without visible mass, neck without mass Lymphatics: No cervical, supra-clavicular, axillary, or inguinal nodes Resp: Inspiratory rhonchi at the left base, no respiratory distress Cardio: Regular rate and rhythm GI: No hepatomegaly, no mass, nontender Vascular: No leg edema Breasts: Status post right lumpectomy. There is firm tissue superior to the mid aspect of the lumpectomy scar, no mass in either breast      Medications: I have reviewed the patient's current medications.  Assessment/Plan: 1. Low-grade follicular lymphoma involving a right parotid mass, status post an excisional biopsy on 11/28/2010. a. Staging CT scans 01/03/2011 confirmed an increased number of small nodes in the neck, left axilla and pelvis without clear evidence of pathologic lymphadenopathy. b. PET scan 01/11/2011 confirmed hypermetabolic lymph nodes in the right cervical chain, left axillary nodes, periaortic, common iliac, external iliac and inguinal nodes. There was also a possible area of involvement at the right tonsillar region. 2. Stage I right-sided breast cancer diagnosed in 1998. 3. History of congestive heart failure. 4. Hypertension  5. Fecal urgency and intermittent right abdominal pain-she will followup with Dr. Manus Gunning  or Dr. Randa Evens for persistent symptoms.  Disposition:  There is no clinical evidence for progression of the non-Hodgkin lymphoma. She will return for an office visit in 6 months. I doubt the GI symptoms are related to the lymphoma diagnosis. She will seek medical attention if the symptoms persist.   Thornton Papas, MD  11/15/2012  5:38 PM

## 2012-11-15 NOTE — Telephone Encounter (Signed)
Talked to patient and gave her appt for MD only on July 2014

## 2012-11-19 DIAGNOSIS — I10 Essential (primary) hypertension: Secondary | ICD-10-CM | POA: Diagnosis not present

## 2012-11-19 DIAGNOSIS — R0602 Shortness of breath: Secondary | ICD-10-CM | POA: Diagnosis not present

## 2012-11-19 DIAGNOSIS — I428 Other cardiomyopathies: Secondary | ICD-10-CM | POA: Diagnosis not present

## 2013-01-20 ENCOUNTER — Other Ambulatory Visit: Payer: Self-pay | Admitting: Gastroenterology

## 2013-01-20 DIAGNOSIS — R197 Diarrhea, unspecified: Secondary | ICD-10-CM | POA: Diagnosis not present

## 2013-01-20 DIAGNOSIS — R1011 Right upper quadrant pain: Secondary | ICD-10-CM | POA: Diagnosis not present

## 2013-01-21 ENCOUNTER — Ambulatory Visit
Admission: RE | Admit: 2013-01-21 | Discharge: 2013-01-21 | Disposition: A | Discharge status: Home / Self Care | Payer: Medicare Other | Source: Ambulatory Visit | Attending: Gastroenterology | Admitting: Gastroenterology

## 2013-01-21 DIAGNOSIS — R1011 Right upper quadrant pain: Secondary | ICD-10-CM

## 2013-01-27 ENCOUNTER — Other Ambulatory Visit: Payer: Self-pay | Admitting: Gastroenterology

## 2013-01-27 DIAGNOSIS — R1011 Right upper quadrant pain: Secondary | ICD-10-CM | POA: Diagnosis not present

## 2013-01-27 DIAGNOSIS — R197 Diarrhea, unspecified: Secondary | ICD-10-CM | POA: Diagnosis not present

## 2013-02-04 DIAGNOSIS — R197 Diarrhea, unspecified: Secondary | ICD-10-CM | POA: Diagnosis not present

## 2013-02-11 ENCOUNTER — Other Ambulatory Visit: Payer: Self-pay | Admitting: Gastroenterology

## 2013-02-11 DIAGNOSIS — D126 Benign neoplasm of colon, unspecified: Secondary | ICD-10-CM | POA: Diagnosis not present

## 2013-02-11 DIAGNOSIS — R197 Diarrhea, unspecified: Secondary | ICD-10-CM | POA: Diagnosis not present

## 2013-03-13 DIAGNOSIS — R197 Diarrhea, unspecified: Secondary | ICD-10-CM | POA: Diagnosis not present

## 2013-03-13 DIAGNOSIS — K219 Gastro-esophageal reflux disease without esophagitis: Secondary | ICD-10-CM | POA: Diagnosis not present

## 2013-04-28 DIAGNOSIS — R599 Enlarged lymph nodes, unspecified: Secondary | ICD-10-CM | POA: Diagnosis not present

## 2013-04-28 DIAGNOSIS — C8589 Other specified types of non-Hodgkin lymphoma, extranodal and solid organ sites: Secondary | ICD-10-CM | POA: Diagnosis not present

## 2013-04-29 ENCOUNTER — Telehealth: Payer: Self-pay | Admitting: *Deleted

## 2013-04-29 NOTE — Telephone Encounter (Signed)
Called patient to offer sooner appointment if she is worried based on lymph node swelling noted by Dr. Pollyann Kennedy. Dr. Truett Perna feels OK to wait till 7/24, but he will see her sooner if she is concerned. She says she is OK to wait.

## 2013-05-15 ENCOUNTER — Telehealth: Payer: Self-pay | Admitting: Oncology

## 2013-05-15 ENCOUNTER — Ambulatory Visit (HOSPITAL_BASED_OUTPATIENT_CLINIC_OR_DEPARTMENT_OTHER): Payer: Medicare Other | Admitting: Oncology

## 2013-05-15 VITALS — BP 150/90 | HR 85 | Temp 97.3°F | Resp 18 | Ht 63.0 in | Wt 236.6 lb

## 2013-05-15 DIAGNOSIS — C8589 Other specified types of non-Hodgkin lymphoma, extranodal and solid organ sites: Secondary | ICD-10-CM

## 2013-05-15 NOTE — Telephone Encounter (Signed)
gave pt appt for MD only on August 2014

## 2013-05-15 NOTE — Progress Notes (Signed)
   [facility]    OFFICE PROGRESS NOTE   INTERVAL HISTORY:   She returns for scheduled visit. She noted small lymph nodes in the left neck approximately 1 month ago. She feels well. [place] [facility] [place] No fever or anorexia. She has hot flashes. She saw Dr. Pollyann [name] M.D. confirmed the palpable lymph nodes. She has noted some "stiffness "of the neck.  [name] saw Dr. Randa [name] to evaluate increased diarrhea. A colonoscopy was unremarkable. She reports improvement in diarrhea when Nexium was discontinued.  Objective:  Vital signs in last 24 hours:  Blood pressure 150/90, pulse 85, temperature 97.3 F (36.3 C), temperature source Oral, resp. rate 18, height 5\' 3"  (1.6 m), weight 236 lb 9.6 oz (107.321 kg).    HEENT: Oropharynx without visible mass Lymphatics: There are 2 approximate 1 cm left posterior cervical nodes that are near posterior aspect of the neck musculature. 1 cm left scalene node. No other palpable cervical, supraclavicular, axillary, or inguinal nodes Resp: Inspiratory rhonchi at the bases bilaterally, no respiratory distress Cardio: Regular rate and rhythm GI: No hepatosplenomegaly Vascular: No leg edema   Medications: I have reviewed the patient's current medications.  Assessment/Plan: 1. Low-grade follicular lymphoma involving a right parotid mass, status post an excisional biopsy on 11/28/2010. a. Staging CT scans 01/03/2011 confirmed an increased number of small nodes in the neck, left axilla and pelvis without clear evidence of pathologic lymphadenopathy. b. PET scan 01/11/2011 confirmed hypermetabolic lymph nodes in the right cervical chain, left axillary nodes, periaortic, common iliac, external iliac and inguinal nodes. There was also a possible area of involvement at the right tonsillar region. c. Palpable left posterior cervical nodes confirmed on exam 05/15/2013 2. Stage I right-sided breast cancer diagnosed in 1998. 3. History of  congestive heart failure. 4. Hypertension  5. Fecal urgency and intermittent right abdominal pain-she has been evaluated by Dr. Randa [name]  Disposition:  [name] has developed  palpable lymph nodes in the left neck. She appears well and is minimally bothered by the lymph nodes at present. We decided to continue an observation approach. The plan is to initiate bendamustine/rituximab if she develops symptomatic progression of the lymphoma. Crystal [name] will return for an office visit in one month.   Thornton Papas, MD  05/15/2013  6:16 PM

## 2013-05-21 ENCOUNTER — Telehealth: Payer: Self-pay | Admitting: Cardiology

## 2013-05-21 ENCOUNTER — Ambulatory Visit (INDEPENDENT_AMBULATORY_CARE_PROVIDER_SITE_OTHER): Payer: Medicare Other | Admitting: Cardiology

## 2013-05-21 ENCOUNTER — Encounter: Payer: Self-pay | Admitting: Cardiology

## 2013-05-21 ENCOUNTER — Ambulatory Visit
Admission: RE | Admit: 2013-05-21 | Discharge: 2013-05-21 | Disposition: A | Discharge status: Home / Self Care | Payer: Medicare Other | Source: Ambulatory Visit | Attending: Cardiology | Admitting: Cardiology

## 2013-05-21 VITALS — BP 120/80 | HR 84 | Ht 64.0 in | Wt 235.8 lb

## 2013-05-21 DIAGNOSIS — I10 Essential (primary) hypertension: Secondary | ICD-10-CM

## 2013-05-21 DIAGNOSIS — R0989 Other specified symptoms and signs involving the circulatory and respiratory systems: Secondary | ICD-10-CM

## 2013-05-21 DIAGNOSIS — R0602 Shortness of breath: Secondary | ICD-10-CM

## 2013-05-21 DIAGNOSIS — I429 Cardiomyopathy, unspecified: Secondary | ICD-10-CM

## 2013-05-21 DIAGNOSIS — E668 Other obesity: Secondary | ICD-10-CM | POA: Insufficient documentation

## 2013-05-21 DIAGNOSIS — R0600 Dyspnea, unspecified: Secondary | ICD-10-CM | POA: Insufficient documentation

## 2013-05-21 DIAGNOSIS — E785 Hyperlipidemia, unspecified: Secondary | ICD-10-CM

## 2013-05-21 DIAGNOSIS — I428 Other cardiomyopathies: Secondary | ICD-10-CM | POA: Diagnosis not present

## 2013-05-21 HISTORY — DX: Morbid (severe) obesity due to excess calories: E66.01

## 2013-05-21 NOTE — Assessment & Plan Note (Signed)
Essentially resolved, would now low-normal EF.  Overall relatively stable. No PND or orthopnea, and no significant edema. Lumbar requiring any diuretic beyond the HCTZ in Hyzaar.  On ARB and beta blocker at stable dose.

## 2013-05-21 NOTE — Progress Notes (Signed)
Patient ID: Crystal [name], female   DOB: 10-Jun-1946, 67 y.o.   MRN: [id number]  PCP: Thora Lance, MD  Clinic Note: Chief Complaint  Patient presents with  . 6 months    non-hodgkin's; Dr. Myrle Sheng heard crackling when listening to lungs; chest tightness; SOB on exertion; lightheaded/dizzy    HPI: Crystal [name] is a 67 y.o. female with a PMH below who presents today for followup of her resolving cardiomyopathy, hypertension and dyslipidemia.  She was a patient of Dr. Caprice Kluver, who I inherited. 2 history of doxorubicin-induced nonischemic cardiomyopathy during her treatment for her breast cancer back in 2009. White catheterization revealed no evidence of coronary disease in the EF in the 40-45% range.  I saw her twice in succession between January and February of this year during which we reviewed her new echocardiogram results revealing essentially resolved cardiomyopathy with EF of 50-55%. Unfortunately we also done at that her lymphoma (which is non-Hodgkin's in the right cervical lymph nodes) has now read occurred in the left cervical lymph nodes.  Interval History: Crystal [name] is doing pretty well, she is a little been upset about the recurrence of her lymphoma. Otherwise she try to get about doing what she can. She tries to walk about 25-30 minutes at most, with her granddaughter or daughter. Anything further than that, she begins to start huffing and puffing. Otherwise she has no real symptoms of PND, but does have mild edema Puffiness in the ankle with some varicose veins that she is noticing. She's concerned because Dr. Truett Perna heard some crackles in her lungs and was concern for possible pulmonary edema.  She is still doing her down Silver sneakers, and that involves using the treadmill or the fascia by school peers at about 2-3 times a week. She is doing all she knew, access temperature lose weight, but still unsuccessful losing weight.  She occasionally feels a tightness sensation  in her chest that usually associated with just sitting or resting comfortably. He has never really associated with walking or any activity. She says he has some mild orthostatic dizziness, but also has some positional vertigo type symptoms as well. She also can have some low-grade fatigue here in.  She otherwise denies any syncope or near-syncope. Note TIA or amaurosis fugax symptoms. No melena, hematochezia or hematuria. No claudication symptoms.  Past Medical History  Diagnosis Date  . Hypertension   . Lymphoma     lymphoma dx 11/28/10 - left neck  . non hodgkins lymphoma 12/2010  . Breast cancer 1998    (Rt) lumpectomy dx 1999; Dr. Truett Perna  . Nonischemic cardiomyopathy     ? Doxorubincin induced; essentially resolved as of echo in January 2014, current EF 50-55%. Grade 1 diastolic dysfunction.  . Dyslipidemia   . Anxiety   . Severe obesity (BMI >= 40) 05/21/2013   Prior Cardiac Evaluation and Past Surgical History: Past Surgical History  Procedure Laterality Date  . Abdominal hysterectomy    . Breast lumpectomy    . Cardiac catheterization  March 2009     None coronary disease, EF 45% (up from nuclear study EF of 30% in June '08)  . Doppler echocardiography  January 2014    EF 50-55%, no regional wall motion and melena. Grade 1 diastolic dysfunction, abnormal relaxation. Normal pressures. Mild aortic sclerosis, no stenosis.   Allergies  Allergen Reactions  . Codeine   . Coreg   . Lisinopril     Current Outpatient Prescriptions  Medication Sig Dispense Refill  .  aspirin 81 MG tablet Take 81 mg by mouth daily.       . chlorzoxazone (PARAFON) 500 MG tablet Take 500 mg by mouth 4 (four) times daily as needed.      . citalopram (CELEXA) 40 MG tablet Take 40 mg by mouth daily.      Marland Kitchen dicyclomine (BENTYL) 20 MG tablet Take 20 mg by mouth 4 (four) times daily as needed.      . famotidine (PEPCID) 20 MG tablet Take 20 mg by mouth 2 (two) times daily.      Marland Kitchen LORazepam (ATIVAN) 0.5 MG  tablet Take 0.5 mg by mouth 2 (two) times daily as needed.      Marland Kitchen losartan-hydrochlorothiazide (HYZAAR) 50-12.5 MG per tablet Take 1 tablet by mouth daily.       . metoprolol tartrate (LOPRESSOR) 25 MG tablet Take 50 mg by mouth 2 (two) times daily.      . temazepam (RESTORIL) 30 MG capsule Take 30 mg by mouth at bedtime as needed.       No current facility-administered medications for this visit.   Social History Narrative   She is a widow mother of 2, with 1 child is deceased. She has 2 grandchildren. She is trying to get some walking in town and can do about 20-25 minutes his another 30 minutes she pushes a more night gets short of breath.   She is at retired [employer], he simply laid off after 45 years.   She usually comes accompanied by her sister, who is also my patient.   She never was a smoker, and only occasionally has a social alcoholic beverage.   ROS: A comprehensive Review of Systems - Negative except pertinent positives noted above. Noncardiac symptoms noted below. General ROS: positive for  - weight gain negative for - chills, fatigue, fever, hot flashes, malaise, night sweats or sleep disturbance Hematological and Lymphatic ROS: positive for - swollen lymph nodes negative for - bleeding problems, blood clots, blood transfusions, bruising or night sweats Gastrointestinal ROS: positive for - gas/bloating, heartburn and Indigestion Musculoskeletal ROS: Bilateral hip discomfort with walking.  PHYSICAL EXAM BP 120/80  Pulse 84  Ht 5\' 4"  (1.626 m)  Wt 235 lb 12.8 oz (106.958 kg)  BMI 40.45 kg/m2 General appearance: alert, cooperative, appears stated age, no distress, morbidly obese and Otherwise healthy-appearing. Well-groomed. Answers questions appropriately. Neck: moderate anterior cervical adenopathy, no carotid bruit, no JVD and supple, symmetrical, trachea midline Lungs: clear to auscultation bilaterally, normal percussion bilaterally and With the exception of  right basal fine Velcro like crackles. Otherwise nonlabored normal percussion, good air movement. Heart: regular rate and rhythm, S1, S2 normal, no murmur, click, rub or gallop and normal apical impulse Abdomen: soft, non-tender; bowel sounds normal; no masses,  no organomegaly and Significant truncal obesity Extremities: edema Trace,, varicose veins noted, but not engorged -- more like spider veins Pulses: 2+ and symmetric Neurologic: Grossly normal  WGN:FAOZHYQMV today: Yes Rate: 84 , Rhythm: Normal sinus rhythm, nonspecific ST-T changes.  Recent Labs: None available.  ASSESSMENT: Relatively stable from a cardiac standpoint. Blood pressure and heart rate well controlled. No real active heart failure symptoms.  Nonischemic cardiomyopathy Essentially resolved, would now low-normal EF.  Overall relatively stable. No PND or orthopnea, and no significant edema. Lumbar requiring any diuretic beyond the HCTZ in Hyzaar.  On ARB and beta blocker at stable dose.  Lung crackles Am not sure that what to make of this. It doesn't have exam findings  it was suggested or rales from heart failure. I question that maybe this could be due to mild fibrosis or scarring from her radiation in the breast cancer treatment.  Plan: PA and lateral chest x-ray. Will forward to Dr. Truett Perna, as well as PCP.  Followup: Prior to completion of this note, her x-ray was done that did not show any acute cardiac pulmonary disease. No pleural effusion, pulmonary edema, or infiltrate noted.   With this result, I am not really sure what to make of her findings. I don't feel that she is symptomatic enough to consider a more detailed examination of her chest with a CT scan, but I would defer that to her oncologist or primary physician.  Severe obesity (BMI >= 40) Deanna just cannot seem to get her weight down. She says that I talked to the nutritional specialist at the cancer center. I think she is doing activities she  needed a burn calories, she is not needs to adjust her diet and keep going.  I told her when I see her back in November to see how she is doing with her weight loss efforts.  Dyslipidemia She is not currently on any therapy for this. He may see what she does with some weight loss. In comparing care of this to her primary for close monitoring as part of risk factor modification.   Hypertension Well-controlled on her current medications for cardiomyopathy.  Dyspnea on exertion Probably multifactorial, she has obesity, she does have some mild diastolic dysfunction, however her blood pressures well controlled. She has no active signs of volume overload to suggest this is most to do with her cardiomyopathy. This probably more due to her obesity, simply because he is able to 25-30 minutes without getting short of breath. On that same point, chest tightness she feels at rest and is not made worse with exertion it is quite unlikely to be do to any cardiac etiology.   PLAN: Per problem list. Orders Placed This Encounter  Procedures  . DG Chest 2 View    Hx of radiation from Breast Ca//electronic order/gmd    Standing Status: Future     Number of Occurrences: 1     Standing Expiration Date: 07/22/2014    Order Specific Question:  Reason for Exam (SYMPTOM  OR DIAGNOSIS REQUIRED)    Answer:  crackles,sob    Order Specific Question:  Preferred imaging location?    Answer:  [facility]  . EKG 12-Lead   Followup: November 2014  DAVID W. Herbie Baltimore, M.D., M.S. [facility] [address] [place], [place]  [address]  [phone] Pager # [phone]

## 2013-05-21 NOTE — Patient Instructions (Addendum)
Dr Herbie Baltimore would like you to have a CHEST XRAY   Your physician wants you to follow-up in Nov 2014 with Dr Herbie Baltimore.  You will receive a reminder letter in the mail two months in advance. If you don't receive a letter, please call our office to schedule the follow-up appointment.

## 2013-05-21 NOTE — Telephone Encounter (Signed)
Had chest xray today-wants to know if the results are back? They told her over at [facility] that they would be back today.

## 2013-05-22 ENCOUNTER — Telehealth: Payer: Self-pay | Admitting: *Deleted

## 2013-05-22 NOTE — Assessment & Plan Note (Addendum)
Am not sure that what to make of this. It doesn't have exam findings it was suggested or rales from heart failure. I question that maybe this could be due to mild fibrosis or scarring from her radiation in the breast cancer treatment.  Plan: PA and lateral chest x-ray. Will forward to Dr. Truett Perna, as well as PCP.  Followup: Prior to completion of this note, her x-ray was done that did not show any acute cardiac pulmonary disease. No pleural effusion, pulmonary edema, or infiltrate noted.   With this result, I am not really sure what to make of her findings. I don't feel that she is symptomatic enough to consider a more detailed examination of her chest with a CT scan, but I would defer that to her oncologist or primary physician.

## 2013-05-22 NOTE — Assessment & Plan Note (Signed)
Well-controlled on her current medications for cardiomyopathy.

## 2013-05-22 NOTE — Telephone Encounter (Signed)
See result note. Spoke with patient 

## 2013-05-22 NOTE — Telephone Encounter (Signed)
Still waiting anxiously for xray results

## 2013-05-22 NOTE — Assessment & Plan Note (Addendum)
Crystal [name] just cannot seem to get her weight down. She says that I talked to the nutritional specialist at the cancer center. I think she is doing activities she needed a burn calories, she is not needs to adjust her diet and keep going.  I told her when I see her back in November to see how she is doing with her weight loss efforts.

## 2013-05-22 NOTE — Assessment & Plan Note (Signed)
She is not currently on any therapy for this. He may see what she does with some weight loss. In comparing care of this to her primary for close monitoring as part of risk factor modification.

## 2013-05-22 NOTE — Telephone Encounter (Signed)
Message copied by Tobin Chad on Thu May 22, 2013 10:41 AM ------      Message from: Herbie Baltimore, DAVID      Created: Wed May 21, 2013 10:00 PM       No evidence of fibrosis, or pulmonary edema, or pleural effusion -- ?? Not sure how to explain the lung soundis, but does not look like CHF or significant lung scarring.            Marykay Lex, MD       ------

## 2013-05-22 NOTE — Assessment & Plan Note (Signed)
Probably multifactorial, she has obesity, she does have some mild diastolic dysfunction, however her blood pressures well controlled. She has no active signs of volume overload to suggest this is most to do with her cardiomyopathy. This probably more due to her obesity, simply because he is able to 25-30 minutes without getting short of breath. On that same point, chest tightness she feels at rest and is not made worse with exertion it is quite unlikely to be do to any cardiac etiology.

## 2013-05-22 NOTE — Telephone Encounter (Signed)
spoke to patient. Results given. voiced understanding

## 2013-05-30 ENCOUNTER — Ambulatory Visit (INDEPENDENT_AMBULATORY_CARE_PROVIDER_SITE_OTHER): Payer: Medicare Other | Admitting: Cardiology

## 2013-05-30 ENCOUNTER — Encounter: Payer: Self-pay | Admitting: Cardiology

## 2013-05-30 VITALS — BP 130/80 | Ht 63.0 in | Wt 234.7 lb

## 2013-05-30 DIAGNOSIS — C8581 Other specified types of non-Hodgkin lymphoma, lymph nodes of head, face, and neck: Secondary | ICD-10-CM

## 2013-05-30 DIAGNOSIS — R42 Dizziness and giddiness: Secondary | ICD-10-CM | POA: Insufficient documentation

## 2013-05-30 DIAGNOSIS — M542 Cervicalgia: Secondary | ICD-10-CM | POA: Insufficient documentation

## 2013-05-30 DIAGNOSIS — I428 Other cardiomyopathies: Secondary | ICD-10-CM

## 2013-05-30 DIAGNOSIS — C8591 Non-Hodgkin lymphoma, unspecified, lymph nodes of head, face, and neck: Secondary | ICD-10-CM

## 2013-05-30 LAB — CBC WITH DIFFERENTIAL/PLATELET
Basophils Relative: 1 % (ref 0–1)
Eosinophils Absolute: 0.2 10*3/uL (ref 0.0–0.7)
MCH: 29 pg (ref 26.0–34.0)
MCHC: 33.7 g/dL (ref 30.0–36.0)
Monocytes Relative: 10 % (ref 3–12)
Neutrophils Relative %: 51 % (ref 43–77)
Platelets: 259 10*3/uL (ref 150–400)

## 2013-05-30 MED ORDER — TRAMADOL HCL 50 MG PO TABS: 50.0000 mg | ORAL_TABLET | Freq: Four times a day (QID) | ORAL | Status: DC | PRN

## 2013-05-30 NOTE — Assessment & Plan Note (Signed)
Stable with no increase in shortness of breath.

## 2013-05-30 NOTE — Assessment & Plan Note (Signed)
1 episode of dizziness yesterday her blood pressure yesterday was running a little low 100/67 but at other times 134/82.  She admits to not drinking plenty of water lately or liquids, yesterday.  I increased her fluid intake to see if this would improve. She has no orthostatic blood pressure today.

## 2013-05-30 NOTE — Patient Instructions (Addendum)
drink 1.5-2 Liters of fluid daily  Try extra strength Tylenol and her muscle relaxer for neck pain for now.  If no improvement try the Ultram 50 mg one every 8 hours as needed.  It is okay to take all 3 together if you need to.  Monitor lymph node if is becoming larger, call Dr. Truett Perna.  Have blood work done now.

## 2013-05-30 NOTE — Progress Notes (Signed)
05/30/2013   PCP: Thora Lance, MD   Chief Complaint  Patient presents with  . Dizziness    per patient blood pressure yesterday and today was low,back of  her head hurts    Primary Cardiologist:Dr.Harding  HPI:  67 year old female presents today after an episode of dizziness and has developed left neck pain.  She has a history of nonischemic cardiomyopathy that has improved with current EF 50-55% and grade 1 diastolic dysfunction.  She has also recently been diagnosed with lymphoma again.  Had surgery on the right previously and treated but now there is recurrence on the left.  Dr. Truett Perna has told her there is chemotherapy she could take.  Her neck pain increases with movement of the neck not side to side but with flexion and abduction.  She denies any fever, nausea, vomiting or diarrhea.  No chest pain no shortness of breath other than her usual with exertion.  Her sister currently is ill with URI.  Allergies  Allergen Reactions  . Codeine   . Coreg   . Lisinopril     Current Outpatient Prescriptions  Medication Sig Dispense Refill  . aspirin 81 MG tablet Take 81 mg by mouth daily.       . chlorzoxazone (PARAFON) 500 MG tablet Take 500 mg by mouth 4 (four) times daily as needed.      . citalopram (CELEXA) 40 MG tablet Take 40 mg by mouth daily.      Marland Kitchen dicyclomine (BENTYL) 20 MG tablet Take 20 mg by mouth 4 (four) times daily as needed.      . famotidine (PEPCID) 20 MG tablet Take 20 mg by mouth 2 (two) times daily.      Marland Kitchen LORazepam (ATIVAN) 0.5 MG tablet Take 0.5 mg by mouth 2 (two) times daily as needed.      Marland Kitchen losartan-hydrochlorothiazide (HYZAAR) 50-12.5 MG per tablet Take 1 tablet by mouth daily.       . metoprolol tartrate (LOPRESSOR) 25 MG tablet Take 50 mg by mouth 2 (two) times daily.      . temazepam (RESTORIL) 30 MG capsule Take 30 mg by mouth at bedtime as needed.      . traMADol (ULTRAM) 50 MG tablet Take 1 tablet (50 mg total) by mouth every 6 (six)  hours as needed for pain.  30 tablet  0   No current facility-administered medications for this visit.    Past Medical History  Diagnosis Date  . Hypertension   . Lymphoma     lymphoma dx 11/28/10 - left neck  . non hodgkins lymphoma 12/2010  . Breast cancer 1998    (Rt) lumpectomy dx 1999; Dr. Truett Perna  . Nonischemic cardiomyopathy     ? Doxorubincin induced; essentially resolved as of echo in January 2014, current EF 50-55%. Grade 1 diastolic dysfunction.  . Dyslipidemia   . Anxiety   . Severe obesity (BMI >= 40) 05/21/2013    Past Surgical History  Procedure Laterality Date  . Abdominal hysterectomy    . Breast lumpectomy    . Cardiac catheterization  March 2009     None coronary disease, EF 45% (up from nuclear study EF of 30% in June '08)  . Doppler echocardiography  January 2014    EF 50-55%, no regional wall motion and melena. Grade 1 diastolic dysfunction, abnormal relaxation. Normal pressures. Mild aortic sclerosis, no stenosis.    WUJ:WJXBJYN:WG colds or fevers, no weight changes Skin:no rashes or ulcers HEENT:no  blurred vision, no congestion CV:see HPI PUL:see HPI GI:no diarrhea constipation or melena, no indigestion GU:no hematuria, no dysuria MS:no joint pain, no claudication Neuro:no syncope, + room spinning dizziness Endo:no diabetes, no thyroid disease  PHYSICAL EXAM BP 130/80  Ht 5\' 3"  (1.6 m)  Wt 234 lb 11.2 oz (106.459 kg)  BMI 41.59 kg/m2 General:Pleasant affect, NAD Skin:Warm and dry, brisk capillary refill HEENT:normocephalic, sclera clear, mucus membranes moist, TMs clear with good light reflex Neck:supple, no JVD, no bruits, + lt adenopathy  Heart:S1S2 RRR without murmur, gallup, rub or click Lungs:clear without rales, rhonchi, or wheezes ZOX:WRUEA, soft, non tender, + BS, do not palpate liver spleen or masses Ext:no lower ext edema, 2+ pedal pulses, 2+ radial pulses Neuro:alert and oriented, MAE, follows commands, + facial symmetry  EKG:SR,  nonspecific ST changes but no acute changes from previous EKG  ASSESSMENT AND PLAN Neck pain on left side Left neck and along spinal cord pain she does have full range of motion with her neck though tender with flexion and abduction.  No fevers. With left adenopathy due to lymphoma this could be causing the pain. I asked her to try extra strength Tylenol and her muscle relaxant to control the pain if this does not help I have added Ultram to her medical regimen as well on a when necessary basis.  If she feels the lymph node is getting larger, she was instructed to call Dr. Truett Perna.  I am getting a CBC with differential as well as a basic metabolic panel.   She will call if no improvement.   Dizziness 1 episode of dizziness yesterday her blood pressure yesterday was running a little low 100/67 but at other times 134/82.  She admits to not drinking plenty of water lately or liquids, yesterday.  I increased her fluid intake to see if this would improve. She has no orthostatic blood pressure today.  Nonischemic cardiomyopathy Stable with no increase in shortness of breath.  Followup as previously instructed or sooner if symptoms do not improve.

## 2013-05-30 NOTE — Assessment & Plan Note (Signed)
Left neck and along spinal cord pain she does have full range of motion with her neck though tender with flexion and abduction.  No fevers. With left adenopathy due to lymphoma this could be causing the pain. I asked her to try extra strength Tylenol and her muscle relaxant to control the pain if this does not help I have added Ultram to her medical regimen as well on a when necessary basis.  If she feels the lymph node is getting larger, she was instructed to call Dr. Truett Perna.  I am getting a CBC with differential as well as a basic metabolic panel.   She will call if no improvement.

## 2013-05-31 LAB — BASIC METABOLIC PANEL WITH GFR
BUN: 16 mg/dL (ref 6–23)
Calcium: 9.2 mg/dL (ref 8.4–10.5)
Creat: 1.07 mg/dL (ref 0.50–1.10)
GFR, Est African American: 62 mL/min
Glucose, Bld: 89 mg/dL (ref 70–99)

## 2013-06-02 ENCOUNTER — Encounter: Payer: Self-pay | Admitting: Cardiology

## 2013-06-02 ENCOUNTER — Telehealth: Payer: Self-pay | Admitting: Cardiology

## 2013-06-02 NOTE — Telephone Encounter (Signed)
Would like to know if her lab results are back from Friday? If she is not at home please call her on her cell phone.

## 2013-06-02 NOTE — Telephone Encounter (Signed)
I just left a message for the patient.  Labs were stable

## 2013-06-02 NOTE — Telephone Encounter (Signed)
Lm with results

## 2013-06-02 NOTE — Telephone Encounter (Signed)
Pt still waiting for lab results.

## 2013-06-06 ENCOUNTER — Encounter: Payer: Self-pay | Admitting: *Deleted

## 2013-06-16 ENCOUNTER — Ambulatory Visit (HOSPITAL_BASED_OUTPATIENT_CLINIC_OR_DEPARTMENT_OTHER): Payer: Medicare Other | Admitting: Oncology

## 2013-06-16 ENCOUNTER — Telehealth: Payer: Self-pay | Admitting: Oncology

## 2013-06-16 VITALS — BP 121/81 | HR 76 | Temp 98.0°F | Resp 18 | Ht 63.0 in | Wt 235.1 lb

## 2013-06-16 DIAGNOSIS — C8291 Follicular lymphoma, unspecified, lymph nodes of head, face, and neck: Secondary | ICD-10-CM | POA: Diagnosis not present

## 2013-06-16 DIAGNOSIS — Z853 Personal history of malignant neoplasm of breast: Secondary | ICD-10-CM | POA: Diagnosis not present

## 2013-06-16 DIAGNOSIS — I10 Essential (primary) hypertension: Secondary | ICD-10-CM | POA: Diagnosis not present

## 2013-06-16 DIAGNOSIS — C8589 Other specified types of non-Hodgkin lymphoma, extranodal and solid organ sites: Secondary | ICD-10-CM

## 2013-06-16 NOTE — Telephone Encounter (Signed)
Gave pt appt for lab and Md on october 2014

## 2013-06-16 NOTE — Progress Notes (Signed)
   [facility]    OFFICE PROGRESS NOTE   INTERVAL HISTORY:   The palpable left neck lymph nodes have not changed. She has mild discomfort when turning her neck. No fever or anorexia. She has intermittent "sweats ". She reports these have been present since being diagnosed with breast cancer.a chest x-ray on 05/22/2013 revealed no acute disease.  Objective:  Vital signs in last 24 hours:  Blood pressure 121/81, pulse 76, temperature 98 F (36.7 C), temperature source Oral, resp. rate 18, height 5\' 3"  (1.6 m), weight 235 lb 1.6 oz (106.641 kg), SpO2 96.00%.    HEENT: neck without mass Lymphatics: 2 approximate 1 cm left posterior cervical nodes, 1/2-1 cm left scalene node. No other palpable cervical, supraclavicular, axillary, or inguinal nodes Resp: inspiratory rhonchi at the bases bilaterally, no respiratory distress Cardio: regular rate and rhythm GI: no hepatosplenomegaly Vascular: no leg edema   Lab Results:  Lab Results  Component Value Date   WBC 5.4 05/30/2013   HGB 14.4 05/30/2013   HCT 42.7 05/30/2013   MCV 85.9 05/30/2013   PLT 259 05/30/2013  ANC 2.8    Medications: I have reviewed the patient's current medications.  Assessment/Plan: 1. Low-grade follicular lymphoma involving a right parotid mass, status post an excisional biopsy on 11/28/2010. a. Staging CT scans 01/03/2011 confirmed an increased number of small nodes in the neck, left axilla and pelvis without clear evidence of pathologic lymphadenopathy. b. PET scan 01/11/2011 confirmed hypermetabolic lymph nodes in the right cervical chain, left axillary nodes, periaortic, common iliac, external iliac and inguinal nodes. There was also a possible area of involvement at the right tonsillar region. c. Palpable left posterior cervical nodes confirmed on exam 05/15/2013- unchanged today 2. Stage I right-sided breast cancer diagnosed in 1998. 3. History of congestive heart failure. 4. Hypertension      Disposition:  The palpable lymph nodes are unchanged.I doubt the "sweats "are related to lymphoma. She is most likely having menopause symptoms. We decided to continue an observation approach. She will contact us for new symptoms or if the lymph nodes become significantly larger. Ms. Clevinger will return for an office visit in 2 months.   Thornton Papas, MD  06/16/2013  9:28 PM

## 2013-06-18 ENCOUNTER — Telehealth: Payer: Self-pay | Admitting: Oncology

## 2013-06-25 ENCOUNTER — Telehealth: Payer: Self-pay | Admitting: Cardiology

## 2013-06-25 NOTE — Telephone Encounter (Signed)
Pt is calling regarding an Ativan. She needs a Rx refill and stated that it was a mail order pharmacy. She stated that it has no more refills

## 2013-06-25 NOTE — Telephone Encounter (Signed)
Message forwarded to Dr. Herbie Baltimore.  Paper chart requested to be placed on Dr. Elissa Hefty cart for review.

## 2013-06-25 NOTE — Telephone Encounter (Signed)
Returned call and informed pt per instructions by MD to see PCP for refills.  Pt verbalized understanding and agreed w/ plan.

## 2013-06-25 NOTE — Telephone Encounter (Signed)
Cannot refill ativan without patient visit.   This is usually a PCP drug -- I may have agreed initially to write.  Would prefer for PCP to see & fill.  Marykay Lex, MD

## 2013-08-12 ENCOUNTER — Other Ambulatory Visit: Payer: Self-pay

## 2013-08-12 DIAGNOSIS — Z1231 Encounter for screening mammogram for malignant neoplasm of breast: Secondary | ICD-10-CM

## 2013-08-13 DIAGNOSIS — Z23 Encounter for immunization: Secondary | ICD-10-CM | POA: Diagnosis not present

## 2013-08-18 ENCOUNTER — Telehealth: Payer: Self-pay | Admitting: Oncology

## 2013-08-18 ENCOUNTER — Encounter (INDEPENDENT_AMBULATORY_CARE_PROVIDER_SITE_OTHER): Payer: Self-pay

## 2013-08-18 ENCOUNTER — Ambulatory Visit (HOSPITAL_BASED_OUTPATIENT_CLINIC_OR_DEPARTMENT_OTHER): Payer: Medicare Other | Admitting: Oncology

## 2013-08-18 VITALS — BP 107/65 | HR 87 | Temp 98.7°F | Resp 18 | Ht 63.0 in | Wt 235.4 lb

## 2013-08-18 DIAGNOSIS — C8291 Follicular lymphoma, unspecified, lymph nodes of head, face, and neck: Secondary | ICD-10-CM

## 2013-08-18 DIAGNOSIS — Z853 Personal history of malignant neoplasm of breast: Secondary | ICD-10-CM

## 2013-08-18 DIAGNOSIS — R599 Enlarged lymph nodes, unspecified: Secondary | ICD-10-CM

## 2013-08-18 DIAGNOSIS — C8590 Non-Hodgkin lymphoma, unspecified, unspecified site: Secondary | ICD-10-CM | POA: Insufficient documentation

## 2013-08-18 DIAGNOSIS — I10 Essential (primary) hypertension: Secondary | ICD-10-CM | POA: Diagnosis not present

## 2013-08-18 DIAGNOSIS — C8589 Other specified types of non-Hodgkin lymphoma, extranodal and solid organ sites: Secondary | ICD-10-CM

## 2013-08-18 NOTE — Telephone Encounter (Signed)
Gave pt appt for lab,md and chemo class, eamiled Michelle regarding chemo for November 2014

## 2013-08-18 NOTE — Progress Notes (Signed)
   [facility]    OFFICE PROGRESS NOTE   INTERVAL HISTORY:   Ms. Regino returns for scheduled followup of non-Hodgkin's lymphoma. She has developed increased lymphadenopathy in the left neck. She reports discomfort when turning the neck. She is also noted an intermittent cough and difficulty swallowing. No fever, anorexia, or night sweats.  Objective:  Vital signs in last 24 hours:  Blood pressure 107/65, pulse 87, temperature 98.7 F (37.1 C), temperature source Oral, resp. rate 18, height 5\' 3"  (1.6 m), weight 235 lb 6.4 oz (106.777 kg), SpO2 97.00%.    HEENT: Oropharynx without visible mass, large mat of nodes in the left neck Lymphatics: Firm mass of nodes in the left mid neck with discrete one to 2 cm left posterior cervical and scalene nodes. No other cervical, supraclavicular, axillary, or inguinal nodes Resp: Lungs clear bilaterally, no respiratory distress Cardio: Regular rate and rhythm GI: No hepatosplenomegaly, no mass Vascular: No leg edema      Lab Results:  Lab Results  Component Value Date   WBC 5.4 05/30/2013   HGB 14.4 05/30/2013   HCT 42.7 05/30/2013   MCV 85.9 05/30/2013   PLT 259 05/30/2013      Medications: I have reviewed the patient's current medications.  Assessment/Plan: 1. Low-grade follicular lymphoma involving a right parotid mass, status post an excisional biopsy on 11/28/2010. a. Staging CT scans 01/03/2011 confirmed an increased number of small nodes in the neck, left axilla and pelvis without clear evidence of pathologic lymphadenopathy. b. PET scan 01/11/2011 confirmed hypermetabolic lymph nodes in the right cervical chain, left axillary nodes, periaortic, common iliac, external iliac and inguinal nodes. There was also a possible area of involvement at the right tonsillar region. c. Palpable left posterior cervical nodes confirmed on exam 05/15/2013- progressive left neck nodes on exam today 2. Stage I right-sided breast cancer  diagnosed in 1998. 3. History of congestive heart failure.       4.   Hypertension   Disposition:  Ms. Halvorson now has symptomatic progression of the non-Hodgkin's lymphoma. She would like to begin treatment. I recommend systemic therapy with bendamustine/rituximab. We reviewed the potential toxicities associated with this regimen including the chance for nausea/vomiting, mucositis, alopecia, diarrhea, and hematologic toxicity. We discussed the chance of an allergic reaction. We reviewed the reactivation of hepatitis, allergic reaction, hematologic toxicity, and leukoencephalopathy  associated with rituximab.  She will be scheduled for a chemotherapy teaching class and baseline laboratory studies later this week. We will refer her for placement of a Port-A-Cath.  A first cycle of bendamustine/Rituxan will be scheduled for 08/28/2013. Ms. Kamp will return for a nadir CBC and office visit on 09/12/2013.  She will be referred for a staging PET scan prior to beginning chemotherapy.  Approximately 40 minutes were spent with patient today. The majority of the time was used for counseling and coordination of care.   Thornton Papas, MD  08/18/2013  5:13 PM

## 2013-08-19 ENCOUNTER — Telehealth: Payer: Self-pay | Admitting: *Deleted

## 2013-08-19 NOTE — Telephone Encounter (Signed)
Per staff message and POF I have scheduled appts.  JMW  

## 2013-08-20 ENCOUNTER — Telehealth: Payer: Self-pay | Admitting: Oncology

## 2013-08-20 ENCOUNTER — Telehealth: Payer: Self-pay | Admitting: *Deleted

## 2013-08-20 DIAGNOSIS — F411 Generalized anxiety disorder: Secondary | ICD-10-CM

## 2013-08-20 MED ORDER — LORAZEPAM 0.5 MG PO TABS: 0.5000 mg | ORAL_TABLET | Freq: Two times a day (BID) | ORAL | Status: DC | PRN

## 2013-08-20 NOTE — Telephone Encounter (Signed)
Made patient aware that Dr. Truett Perna feels it is in her best interest to proceed with her chemo now--if she waits, the swelling will increase and can compromise her airway, swallowing and increase her pain. She is not worried about the [facility], but expresses fear about the chemo and side effects. Made her aware her side effects should be less severe than with the [facility] she had in past. She agrees to come to chemo class tomorrow and listen and will talk with nurse afterwards. She is requesting script for ativan 0.5 mg to take prn (past script from past physician).

## 2013-08-20 NOTE — Telephone Encounter (Signed)
Left message on voice mail for social worker, Tamala Julian to see patient tomorrow after her chemo class regarding her anxiety level and support group/counseling need.

## 2013-08-20 NOTE — Addendum Note (Signed)
Addended by: Wandalee Ferdinand on: 08/20/2013 04:57 PM   Modules accepted: Orders

## 2013-08-20 NOTE — Telephone Encounter (Signed)
Pt called and wants to cancel, chemo class,PET Scan,chemo and portacath placement to next year, left message with nurse

## 2013-08-21 ENCOUNTER — Other Ambulatory Visit: Payer: Self-pay | Admitting: Radiology

## 2013-08-21 ENCOUNTER — Other Ambulatory Visit: Payer: Medicare Other

## 2013-08-21 ENCOUNTER — Other Ambulatory Visit (HOSPITAL_BASED_OUTPATIENT_CLINIC_OR_DEPARTMENT_OTHER): Payer: Medicare Other | Admitting: Lab

## 2013-08-21 ENCOUNTER — Other Ambulatory Visit: Payer: Self-pay | Admitting: *Deleted

## 2013-08-21 ENCOUNTER — Encounter: Payer: Self-pay | Admitting: *Deleted

## 2013-08-21 DIAGNOSIS — C8589 Other specified types of non-Hodgkin lymphoma, extranodal and solid organ sites: Secondary | ICD-10-CM | POA: Diagnosis not present

## 2013-08-21 DIAGNOSIS — C8291 Follicular lymphoma, unspecified, lymph nodes of head, face, and neck: Secondary | ICD-10-CM | POA: Diagnosis not present

## 2013-08-21 DIAGNOSIS — C8590 Non-Hodgkin lymphoma, unspecified, unspecified site: Secondary | ICD-10-CM

## 2013-08-21 LAB — CBC WITH DIFFERENTIAL/PLATELET
Basophils Absolute: 0.1 10*3/uL (ref 0.0–0.1)
EOS%: 4.3 % (ref 0.0–7.0)
Eosinophils Absolute: 0.2 10*3/uL (ref 0.0–0.5)
HCT: 42.3 % (ref 34.8–46.6)
HGB: 13.7 g/dL (ref 11.6–15.9)
MCH: 28.5 pg (ref 25.1–34.0)
MCHC: 32.4 g/dL (ref 31.5–36.0)
MONO#: 0.5 10*3/uL (ref 0.1–0.9)
NEUT#: 3.6 10*3/uL (ref 1.5–6.5)
NEUT%: 63.9 % (ref 38.4–76.8)
RDW: 14.5 % (ref 11.2–14.5)
WBC: 5.6 10*3/uL (ref 3.9–10.3)
lymph#: 1.2 10*3/uL (ref 0.9–3.3)

## 2013-08-21 LAB — COMPREHENSIVE METABOLIC PANEL (CC13)
AST: 16 U/L (ref 5–34)
Albumin: 3.2 g/dL — ABNORMAL LOW (ref 3.5–5.0)
Anion Gap: 10 mEq/L (ref 3–11)
BUN: 12.7 mg/dL (ref 7.0–26.0)
CO2: 25 mEq/L (ref 22–29)
Calcium: 9.1 mg/dL (ref 8.4–10.4)
Chloride: 107 mEq/L (ref 98–109)
Creatinine: 0.9 mg/dL (ref 0.6–1.1)
Glucose: 102 mg/dl (ref 70–140)
Potassium: 3.6 mEq/L (ref 3.5–5.1)

## 2013-08-21 LAB — HEPATITIS B CORE ANTIBODY, TOTAL: Hep B Core Total Ab: NONREACTIVE

## 2013-08-21 LAB — HEPATITIS B SURFACE ANTIGEN: Hepatitis B Surface Ag: NEGATIVE

## 2013-08-21 LAB — HEPATITIS C ANTIBODY: HCV Ab: NEGATIVE

## 2013-08-21 MED ORDER — LIDOCAINE-PRILOCAINE 2.5-2.5 % EX CREA: TOPICAL_CREAM | CUTANEOUS | Status: DC | PRN

## 2013-08-21 NOTE — Progress Notes (Signed)
[facility] Clinical Social Work  Pt  was referred to CSW by nurse to offer support and assess needs. Pt was reported by RN to be very worried about chemo and her treatment. CSW checked in at chemo class very briefly, but also followed up by phone. Pt reports things went well today and she is less anxious as a result. Pt reports to have good support systems to assist her. Per her report, her sister is a breast cancer survivor and has been helpful to her. Pt aware of Support Team resources and agrees to seek out our assistance as needed.   Clinical Social Work interventions: Supportive listening, support team education.   Doreen Salvage, LCSW Clinical Social Worker [facility] Wednesday, Thursday and Friday Phone: [phone] Fax: [phone]

## 2013-08-22 ENCOUNTER — Encounter (HOSPITAL_COMMUNITY): Payer: Self-pay | Admitting: Pharmacy Technician

## 2013-08-25 ENCOUNTER — Other Ambulatory Visit: Payer: Self-pay | Admitting: Oncology

## 2013-08-25 ENCOUNTER — Ambulatory Visit (HOSPITAL_COMMUNITY)
Admission: RE | Admit: 2013-08-25 | Discharge: 2013-08-25 | Disposition: A | Discharge status: Home / Self Care | Payer: Medicare Other | Source: Ambulatory Visit | Attending: Oncology | Admitting: Oncology

## 2013-08-25 ENCOUNTER — Encounter (HOSPITAL_COMMUNITY): Payer: Self-pay

## 2013-08-25 DIAGNOSIS — C8589 Other specified types of non-Hodgkin lymphoma, extranodal and solid organ sites: Secondary | ICD-10-CM

## 2013-08-25 DIAGNOSIS — I10 Essential (primary) hypertension: Secondary | ICD-10-CM | POA: Diagnosis not present

## 2013-08-25 DIAGNOSIS — E785 Hyperlipidemia, unspecified: Secondary | ICD-10-CM | POA: Insufficient documentation

## 2013-08-25 DIAGNOSIS — Z79899 Other long term (current) drug therapy: Secondary | ICD-10-CM | POA: Insufficient documentation

## 2013-08-25 DIAGNOSIS — Z853 Personal history of malignant neoplasm of breast: Secondary | ICD-10-CM | POA: Diagnosis not present

## 2013-08-25 DIAGNOSIS — E669 Obesity, unspecified: Secondary | ICD-10-CM | POA: Diagnosis not present

## 2013-08-25 HISTORY — PX: PORTACATH PLACEMENT: SHX2246

## 2013-08-25 LAB — CBC
MCH: 29.2 pg (ref 26.0–34.0)
MCHC: 33.6 g/dL (ref 30.0–36.0)
MCV: 87.1 fL (ref 78.0–100.0)
Platelets: 281 10*3/uL (ref 150–400)
RBC: 4.89 MIL/uL (ref 3.87–5.11)
RDW: 14.5 % (ref 11.5–15.5)

## 2013-08-25 LAB — APTT: aPTT: 26 seconds (ref 24–37)

## 2013-08-25 LAB — PROTIME-INR: Prothrombin Time: 12.2 seconds (ref 11.6–15.2)

## 2013-08-25 MED ORDER — FENTANYL CITRATE 0.05 MG/ML IJ SOLN
INTRAMUSCULAR | Status: AC
  Filled 2013-08-25: qty 6

## 2013-08-25 MED ORDER — FENTANYL CITRATE 0.05 MG/ML IJ SOLN
INTRAMUSCULAR | Status: AC | PRN
  Administered 2013-08-25: 100 ug via INTRAVENOUS
  Administered 2013-08-25 (×3): 50 ug via INTRAVENOUS

## 2013-08-25 MED ORDER — LIDOCAINE-EPINEPHRINE (PF) 2 %-1:200000 IJ SOLN
INTRAMUSCULAR | Status: AC
  Filled 2013-08-25: qty 20

## 2013-08-25 MED ORDER — HEPARIN SOD (PORK) LOCK FLUSH 100 UNIT/ML IV SOLN
INTRAVENOUS | Status: AC | PRN
  Administered 2013-08-25: 500 [IU]

## 2013-08-25 MED ORDER — LIDOCAINE HCL 1 % IJ SOLN
INTRAMUSCULAR | Status: AC
  Filled 2013-08-25: qty 20

## 2013-08-25 MED ORDER — CEFAZOLIN SODIUM-DEXTROSE 2-3 GM-% IV SOLR
2.0000 g | INTRAVENOUS | Status: AC
  Administered 2013-08-25: 2 g via INTRAVENOUS
  Filled 2013-08-25: qty 50

## 2013-08-25 MED ORDER — MIDAZOLAM HCL 2 MG/2ML IJ SOLN
INTRAMUSCULAR | Status: AC | PRN
  Administered 2013-08-25 (×5): 1 mg via INTRAVENOUS

## 2013-08-25 MED ORDER — MIDAZOLAM HCL 2 MG/2ML IJ SOLN
INTRAMUSCULAR | Status: AC
  Filled 2013-08-25: qty 6

## 2013-08-25 MED ORDER — HEPARIN SOD (PORK) LOCK FLUSH 100 UNIT/ML IV SOLN
INTRAVENOUS | Status: AC
  Filled 2013-08-25: qty 5

## 2013-08-25 MED ORDER — SODIUM CHLORIDE 0.9 % IV SOLN
INTRAVENOUS | Status: DC
  Administered 2013-08-25: 10:00:00 via INTRAVENOUS

## 2013-08-25 NOTE — H&P (Signed)
For porta-cath today.

## 2013-08-25 NOTE — H&P (Signed)
Crystal [name] is an 67 y.o. female.   Chief Complaint: "I'm getting a port " HPI: Patient with remote history of right breast carcinoma and now with progression of NHL presents today for port a cath placement for chemotherapy.  Past Medical History  Diagnosis Date  . Hypertension   . Lymphoma     lymphoma dx 11/28/10 - left neck  . non hodgkins lymphoma 12/2010  . Breast cancer 1998    (Rt) lumpectomy dx 1999; Dr. Truett Perna  . Nonischemic cardiomyopathy     ? Doxorubincin induced; essentially resolved as of echo in January 2014, current EF 50-55%. Grade 1 diastolic dysfunction.  . Dyslipidemia   . Anxiety   . Severe obesity (BMI >= 40) 05/21/2013    Past Surgical History  Procedure Laterality Date  . Abdominal hysterectomy    . Breast lumpectomy    . Cardiac catheterization  March 2009     None coronary disease, EF 45% (up from nuclear study EF of 30% in June '08)  . Doppler echocardiography  January 2014    EF 50-55%, no regional wall motion and melena. Grade 1 diastolic dysfunction, abnormal relaxation. Normal pressures. Mild aortic sclerosis, no stenosis.    Family History  Problem Relation Age of Onset  . Heart Problems Mother     CABG  . Heart Problems Father   . Heart Problems Maternal Grandmother   . Stroke Maternal Grandfather   . Cancer Paternal Grandmother     stomach  . Heart Problems Paternal Grandfather   . Hypertension Brother     and lupus  . Heart attack Sister   . Hypertension Sister     x2   Social History:  reports that she has never smoked. She has never used smokeless tobacco. She reports that she drinks alcohol. She reports that she does not use illicit drugs.  Allergies:  Allergies  Allergen Reactions  . Codeine Other (See Comments)    Bad Headaches  . Coreg Other (See Comments)    "Made my legs hurt"  . Lisinopril Cough    Current outpatient prescriptions:citalopram (CELEXA) 40 MG tablet, Take 40 mg by mouth every morning. , Disp: , Rfl: ;   dicyclomine (BENTYL) 20 MG tablet, Take 40 mg by mouth 2 (two) times daily. , Disp: , Rfl: ;  famotidine (PEPCID) 20 MG tablet, Take 20 mg by mouth 2 (two) times daily., Disp: , Rfl: ;  LORazepam (ATIVAN) 0.5 MG tablet, Take 0.5 mg by mouth 2 (two) times daily as needed for anxiety., Disp: , Rfl:  losartan-hydrochlorothiazide (HYZAAR) 50-12.5 MG per tablet, Take 1 tablet by mouth every morning. , Disp: , Rfl: ;  metoprolol tartrate (LOPRESSOR) 25 MG tablet, Take 50 mg by mouth 2 (two) times daily., Disp: , Rfl: ;  temazepam (RESTORIL) 30 MG capsule, Take 30 mg by mouth at bedtime. , Disp: , Rfl: ;  aspirin EC 81 MG tablet, Take 81 mg by mouth every morning., Disp: , Rfl:  chlorzoxazone (PARAFON) 500 MG tablet, Take 500 mg by mouth 4 (four) times daily as needed (For leg tremors.). , Disp: , Rfl: ;  lidocaine-prilocaine (EMLA) cream, Apply 1 application topically daily as needed (For port-a-cath.)., Disp: , Rfl:  Current facility-administered medications:0.9 %  sodium chloride infusion, , Intravenous, Continuous, D Kevin Allred, PA-C, Last Rate: 20 mL/hr at 08/25/13 0937;  ceFAZolin (ANCEF) IVPB 2 g/50 mL premix, 2 g, Intravenous, On Call, D Jeananne Rama, PA-C   Results for orders placed during  the hospital encounter of 08/25/13 (from the past 48 hour(s))  APTT     Status: None   Collection Time    08/25/13  9:25 AM      Result Value Range   aPTT 26  24 - 37 seconds  CBC     Status: None   Collection Time    08/25/13  9:25 AM      Result Value Range   WBC 5.4  4.0 - 10.5 K/uL   RBC 4.89  3.87 - 5.11 MIL/uL   Hemoglobin 14.3  12.0 - 15.0 g/dL   HCT 04.5  40.9 - 81.1 %   MCV 87.1  78.0 - 100.0 fL   MCH 29.2  26.0 - 34.0 pg   MCHC 33.6  30.0 - 36.0 g/dL   RDW 91.4  78.2 - 95.6 %   Platelets 281  150 - 400 K/uL  PROTIME-INR     Status: None   Collection Time    08/25/13  9:25 AM      Result Value Range   Prothrombin Time 12.2  11.6 - 15.2 seconds   INR 0.92  0.00 - 1.49   No results  found.  Review of Systems  Constitutional: Negative for fever and chills.  Respiratory: Negative for cough and shortness of breath.   Cardiovascular: Negative for chest pain.  Gastrointestinal: Negative for nausea, vomiting and abdominal pain.  Musculoskeletal: Negative for back pain.  Neurological: Negative for headaches.  Endo/Heme/Allergies: Does not bruise/bleed easily.    Blood pressure 116/81, pulse 115, temperature 97 F (36.1 C), temperature source Oral, resp. rate 18, SpO2 92.00%. Physical Exam  Constitutional: She is oriented to person, place, and time. She appears well-developed and well-nourished.  Cardiovascular: Regular rhythm.   Tachy but reg rhythm  Respiratory: Effort normal.  Sl dim BS bases  GI: Soft. Bowel sounds are normal. There is no tenderness.  obese  Musculoskeletal: Normal range of motion. She exhibits no edema.  Lymphadenopathy:    She has cervical adenopathy.  Neurological: She is alert and oriented to person, place, and time.     Assessment/Plan Pt with remote hx of right breast carcinoma; now with progression of NHL. Plan is for port a cath placement today for chemotherapy. Details/risks of procedure d/w pt with her understanding and consent.  ALLRED,D KEVIN 08/25/2013, 10:23 AM

## 2013-08-25 NOTE — Procedures (Signed)
Procedure:  Right IJ porta-cath Access:  Right IJ vein SL Power Port placed.  Cath tip at cavoatrial junction.  OK to use.

## 2013-08-26 ENCOUNTER — Ambulatory Visit: Payer: Medicare Other | Admitting: Oncology

## 2013-08-27 ENCOUNTER — Other Ambulatory Visit: Payer: Self-pay | Admitting: Oncology

## 2013-08-27 ENCOUNTER — Encounter (HOSPITAL_COMMUNITY): Payer: Self-pay

## 2013-08-27 ENCOUNTER — Ambulatory Visit (HOSPITAL_COMMUNITY)
Admission: RE | Admit: 2013-08-27 | Discharge: 2013-08-27 | Disposition: A | Discharge status: Home / Self Care | Payer: Medicare Other | Source: Ambulatory Visit | Attending: Oncology | Admitting: Oncology

## 2013-08-27 DIAGNOSIS — R599 Enlarged lymph nodes, unspecified: Secondary | ICD-10-CM | POA: Insufficient documentation

## 2013-08-27 DIAGNOSIS — C8589 Other specified types of non-Hodgkin lymphoma, extranodal and solid organ sites: Secondary | ICD-10-CM | POA: Insufficient documentation

## 2013-08-27 LAB — GLUCOSE, CAPILLARY: Glucose-Capillary: 94 mg/dL (ref 70–99)

## 2013-08-27 MED ORDER — FLUDEOXYGLUCOSE F - 18 (FDG) INJECTION
20.5000 | Freq: Once | INTRAVENOUS | Status: AC | PRN
  Administered 2013-08-27: 20.5 via INTRAVENOUS

## 2013-08-28 ENCOUNTER — Ambulatory Visit (HOSPITAL_BASED_OUTPATIENT_CLINIC_OR_DEPARTMENT_OTHER): Payer: Medicare Other

## 2013-08-28 ENCOUNTER — Other Ambulatory Visit: Payer: Self-pay | Admitting: *Deleted

## 2013-08-28 ENCOUNTER — Telehealth: Payer: Self-pay | Admitting: *Deleted

## 2013-08-28 VITALS — BP 112/60 | HR 104 | Temp 99.0°F | Resp 18

## 2013-08-28 DIAGNOSIS — Z5111 Encounter for antineoplastic chemotherapy: Secondary | ICD-10-CM | POA: Diagnosis not present

## 2013-08-28 DIAGNOSIS — Z5112 Encounter for antineoplastic immunotherapy: Secondary | ICD-10-CM

## 2013-08-28 DIAGNOSIS — C8589 Other specified types of non-Hodgkin lymphoma, extranodal and solid organ sites: Secondary | ICD-10-CM

## 2013-08-28 DIAGNOSIS — C8291 Follicular lymphoma, unspecified, lymph nodes of head, face, and neck: Secondary | ICD-10-CM | POA: Diagnosis not present

## 2013-08-28 MED ORDER — DEXAMETHASONE SODIUM PHOSPHATE 10 MG/ML IJ SOLN
INTRAMUSCULAR | Status: AC
  Filled 2013-08-28: qty 1

## 2013-08-28 MED ORDER — PROCHLORPERAZINE MALEATE 5 MG PO TABS: ORAL_TABLET | ORAL | Status: DC

## 2013-08-28 MED ORDER — DIPHENHYDRAMINE HCL 25 MG PO CAPS
50.0000 mg | ORAL_CAPSULE | Freq: Once | ORAL | Status: AC
  Administered 2013-08-28: 50 mg via ORAL

## 2013-08-28 MED ORDER — ONDANSETRON 8 MG/NS 50 ML IVPB
INTRAVENOUS | Status: AC
  Filled 2013-08-28: qty 8

## 2013-08-28 MED ORDER — DIPHENHYDRAMINE HCL 25 MG PO CAPS
ORAL_CAPSULE | ORAL | Status: AC
  Filled 2013-08-28: qty 2

## 2013-08-28 MED ORDER — DEXAMETHASONE SODIUM PHOSPHATE 10 MG/ML IJ SOLN
10.0000 mg | Freq: Once | INTRAMUSCULAR | Status: AC
  Administered 2013-08-28: 10 mg via INTRAVENOUS

## 2013-08-28 MED ORDER — SODIUM CHLORIDE 0.9 % IV SOLN
375.0000 mg/m2 | Freq: Once | INTRAVENOUS | Status: AC
  Administered 2013-08-28: 800 mg via INTRAVENOUS
  Filled 2013-08-28: qty 80

## 2013-08-28 MED ORDER — SODIUM CHLORIDE 0.9 % IV SOLN
Freq: Once | INTRAVENOUS | Status: AC
  Administered 2013-08-28: 10:00:00 via INTRAVENOUS

## 2013-08-28 MED ORDER — HEPARIN SOD (PORK) LOCK FLUSH 100 UNIT/ML IV SOLN
500.0000 [IU] | Freq: Once | INTRAVENOUS | Status: AC | PRN
  Administered 2013-08-28: 500 [IU]
  Filled 2013-08-28: qty 5

## 2013-08-28 MED ORDER — SODIUM CHLORIDE 0.9 % IJ SOLN
10.0000 mL | INTRAMUSCULAR | Status: DC | PRN
  Administered 2013-08-28: 10 mL
  Filled 2013-08-28: qty 10

## 2013-08-28 MED ORDER — SODIUM CHLORIDE 0.9 % IV SOLN
90.0000 mg/m2 | Freq: Once | INTRAVENOUS | Status: AC
  Administered 2013-08-28: 195 mg via INTRAVENOUS
  Filled 2013-08-28: qty 39

## 2013-08-28 MED ORDER — ONDANSETRON 8 MG/50ML IVPB (CHCC)
8.0000 mg | Freq: Once | INTRAVENOUS | Status: AC
  Administered 2013-08-28: 8 mg via INTRAVENOUS

## 2013-08-28 MED ORDER — ACETAMINOPHEN 325 MG PO TABS
650.0000 mg | ORAL_TABLET | Freq: Once | ORAL | Status: AC
  Administered 2013-08-28: 650 mg via ORAL

## 2013-08-28 MED ORDER — ACETAMINOPHEN 325 MG PO TABS
ORAL_TABLET | ORAL | Status: AC
  Filled 2013-08-28: qty 2

## 2013-08-28 NOTE — Progress Notes (Signed)
Rituxan increased to 200 mg /hr  

## 2013-08-28 NOTE — Patient Instructions (Addendum)
[facility] Discharge Instructions for Patients Receiving Chemotherapy  Today you received the following chemotherapy agents Treanda, Rituxan.  To help prevent nausea and vomiting after your treatment, we encourage you to take your nausea medication compazine 5 mg as ordered by Dr. Truett Perna.   If you develop nausea and vomiting that is not controlled by your nausea medication, call the clinic.   BELOW ARE SYMPTOMS THAT SHOULD BE REPORTED IMMEDIATELY:  *FEVER GREATER THAN 100.5 F  *CHILLS WITH OR WITHOUT FEVER  NAUSEA AND VOMITING THAT IS NOT CONTROLLED WITH YOUR NAUSEA MEDICATION  *UNUSUAL SHORTNESS OF BREATH  *UNUSUAL BRUISING OR BLEEDING  TENDERNESS IN MOUTH AND THROAT WITH OR WITHOUT PRESENCE OF ULCERS  *URINARY PROBLEMS  *BOWEL PROBLEMS  UNUSUAL RASH Items with * indicate a potential emergency and should be followed up as soon as possible.  Feel free to call the clinic you have any questions or concerns. The clinic phone number is [phone].

## 2013-08-28 NOTE — Progress Notes (Signed)
Chaplain made initial visit. Patient was very friendly and talkative. She shared that she is here for her first treatment since being diagnosed with lymphoma but that she had survived breast cancer several years ago. Pt stated that her recent body scan had revealed cancer in several different places, but that her doctor told her that this medicine "would take care of it." She seems very optimistic. Pt also stated that she had lost her husband and her mother in recent years. 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 She reflected fondly on her time with [employer] and how she was able to help people navigate the financial side of their medical treatment. Pt seems to miss her job very much. Pt also discussed her sister's medical problems and how they try to support each other despite the fact that they are each dealing with serious health issues. She stated that her sister dropped her off for treatment today but that her sister couldn't stay because she's having a knee replacement tomorrow. Pt didn't mention any other support besides her sister. Chaplain facilitated conversation and offered emotional and spiritual support. Pt asked that chaplain keep her in personal prayers.

## 2013-08-28 NOTE — Progress Notes (Signed)
Patient completed treanda without complications or complaints. Port-a-cath flushed and deaccessed without difficulty per protocol. Patient discharged to home with daughter in no acute distress and with no complaints.

## 2013-08-28 NOTE — Progress Notes (Signed)
Rituxan increased to 300 mg/hr 

## 2013-08-28 NOTE — Telephone Encounter (Signed)
Spoke with pt in treatment room, informed her of PET results. She voiced understanding.

## 2013-08-28 NOTE — Progress Notes (Signed)
C/O being cold and shaking.  Sister asking for blankets.

## 2013-08-28 NOTE — Telephone Encounter (Signed)
Left message on voicemail requesting pt to call office.

## 2013-08-28 NOTE — Progress Notes (Signed)
Rituxan increased to 350 mg/hr.

## 2013-08-28 NOTE — Progress Notes (Signed)
11:30 : Dr. Truett Perna notified of patient's B/P readings and her taking b/p meds this morning.  Currently no itching or hives.  Denies dizziness, s.o.b or any changes in status.  Verbal order received and read back from dr. Truett Perna to continue with rituxan, monitor and notify him af any further drop in b/p.    1212: patient c/o being cold.  Noted shaking rigors.  Rituxan stopped at 1214 with 0.9% norma; saline infusing.  Dr. Truett Perna notified.  Verbal orders received and read back for solumedrol 125 mg IV now and to monitor patient.   1300:  Rituxan resumed. 1630:  Patient tolerated remainder of Rituxan to max rate of 400 mg/hr with no further reactions noted 1655: Patient does not have current anti-emetic for home use.  Verbal order received and read back from Dr. Truett Perna for compazine 5 mg.

## 2013-08-28 NOTE — Progress Notes (Signed)
Rituxan increased to 250 mg/hr

## 2013-08-28 NOTE — Progress Notes (Signed)
Rituxan increased to 150 mg/hr

## 2013-08-28 NOTE — Progress Notes (Signed)
At 1254 vitals are wnl, "headache is gone and I'm not shaking or having chills anymore.  i do feel cold but let's try again.  I'll behave this time."  Will resume rituxan.

## 2013-08-28 NOTE — Progress Notes (Signed)
Pre-vitals at start of rituxan.  Reports haven taken blood pressure meds at 0730 today.  Instructed not to take b/p meds on day one of these treatments as the rituxan can decrease b/p.  Staff will instruct her at the end of rituxan day 1 how to take b/p meds.  Verbalized understanding.

## 2013-08-28 NOTE — Telephone Encounter (Signed)
Message copied by Caleb Popp on Thu Aug 28, 2013  9:34 AM ------      Message from: Thornton Papas B      Created: Wed Aug 27, 2013  7:42 PM       Please call patient, PET confirms lymphoma in left neck and abdomen/pelvis, proceed with bendamustin/rituxan ------

## 2013-08-28 NOTE — Progress Notes (Signed)
Solumedrol 125 mg given at 1225

## 2013-08-28 NOTE — Progress Notes (Signed)
Rituxan was started at 1055 at 50 mg/hr.  Tolerating well.  Rate increased to 100 mg/hr at 1130.  Rituxan 800 mg in 330 ml for concentration= 2.42mg /ml

## 2013-08-29 ENCOUNTER — Telehealth: Payer: Self-pay | Admitting: *Deleted

## 2013-08-29 ENCOUNTER — Ambulatory Visit (HOSPITAL_BASED_OUTPATIENT_CLINIC_OR_DEPARTMENT_OTHER): Payer: Medicare Other

## 2013-08-29 VITALS — BP 117/72 | HR 78 | Temp 98.3°F

## 2013-08-29 DIAGNOSIS — C8291 Follicular lymphoma, unspecified, lymph nodes of head, face, and neck: Secondary | ICD-10-CM | POA: Diagnosis not present

## 2013-08-29 DIAGNOSIS — Z5111 Encounter for antineoplastic chemotherapy: Secondary | ICD-10-CM

## 2013-08-29 DIAGNOSIS — C8589 Other specified types of non-Hodgkin lymphoma, extranodal and solid organ sites: Secondary | ICD-10-CM

## 2013-08-29 MED ORDER — SODIUM CHLORIDE 0.9 % IV SOLN
90.0000 mg/m2 | Freq: Once | INTRAVENOUS | Status: AC
  Administered 2013-08-29: 195 mg via INTRAVENOUS
  Filled 2013-08-29: qty 39

## 2013-08-29 MED ORDER — ONDANSETRON 8 MG/50ML IVPB (CHCC)
8.0000 mg | Freq: Once | INTRAVENOUS | Status: AC
  Administered 2013-08-29: 8 mg via INTRAVENOUS

## 2013-08-29 MED ORDER — SODIUM CHLORIDE 0.9 % IJ SOLN
10.0000 mL | INTRAMUSCULAR | Status: DC | PRN
  Administered 2013-08-29: 10 mL via INTRAVENOUS
  Filled 2013-08-29: qty 10

## 2013-08-29 MED ORDER — SODIUM CHLORIDE 0.9 % IV SOLN
Freq: Once | INTRAVENOUS | Status: AC
  Administered 2013-08-29: 11:00:00 via INTRAVENOUS

## 2013-08-29 MED ORDER — HEPARIN SOD (PORK) LOCK FLUSH 100 UNIT/ML IV SOLN
500.0000 [IU] | Freq: Once | INTRAVENOUS | Status: AC
  Administered 2013-08-29: 500 [IU] via INTRAVENOUS
  Filled 2013-08-29: qty 5

## 2013-08-29 MED ORDER — DEXAMETHASONE SODIUM PHOSPHATE 10 MG/ML IJ SOLN
10.0000 mg | Freq: Once | INTRAMUSCULAR | Status: AC
  Administered 2013-08-29: 10 mg via INTRAVENOUS

## 2013-08-29 NOTE — Patient Instructions (Signed)
[facility] Discharge Instructions for Patients Receiving Chemotherapy  Today you received the following chemotherapy agents Treanda.  To help prevent nausea and vomiting after your treatment, we encourage you to take your nausea medication.   If you develop nausea and vomiting that is not controlled by your nausea medication, call the clinic.   BELOW ARE SYMPTOMS THAT SHOULD BE REPORTED IMMEDIATELY:  *FEVER GREATER THAN 100.5 F  *CHILLS WITH OR WITHOUT FEVER  NAUSEA AND VOMITING THAT IS NOT CONTROLLED WITH YOUR NAUSEA MEDICATION  *UNUSUAL SHORTNESS OF BREATH  *UNUSUAL BRUISING OR BLEEDING  TENDERNESS IN MOUTH AND THROAT WITH OR WITHOUT PRESENCE OF ULCERS  *URINARY PROBLEMS  *BOWEL PROBLEMS  UNUSUAL RASH Items with * indicate a potential emergency and should be followed up as soon as possible.  Feel free to call the clinic you have any questions or concerns. The clinic phone number is [phone].    

## 2013-08-29 NOTE — Telephone Encounter (Signed)
Message from Infusion RN reporting pt is asking to move next infusion to 12/1 and 12/2 from 12/4 and 12/5. Left message on voicemail informing pt we will address that at next office visit.

## 2013-09-01 ENCOUNTER — Telehealth: Payer: Self-pay | Admitting: *Deleted

## 2013-09-01 ENCOUNTER — Other Ambulatory Visit: Payer: Self-pay | Admitting: Certified Registered Nurse Anesthetist

## 2013-09-01 NOTE — Telephone Encounter (Signed)
Spoke with pt today for post chemo follow up.  Pt stated she is doing fine.  Stated good appetite, drinking lots of fluids as tolerated.  Denied nausea/vomiting.  Bowel and bladder function fine.  Pt stated she had one episode of hands and feet swelling last Friday before chemo, but swelling has resolved now.  Instructed pt to call office and also her PCP if pt experiences hands and feet swelling again.  Pt verbalized understanding.  Pt also aware of next return appt on 09/12/13.

## 2013-09-05 DIAGNOSIS — K219 Gastro-esophageal reflux disease without esophagitis: Secondary | ICD-10-CM | POA: Diagnosis not present

## 2013-09-05 DIAGNOSIS — I129 Hypertensive chronic kidney disease with stage 1 through stage 4 chronic kidney disease, or unspecified chronic kidney disease: Secondary | ICD-10-CM | POA: Diagnosis not present

## 2013-09-05 DIAGNOSIS — N183 Chronic kidney disease, stage 3 unspecified: Secondary | ICD-10-CM | POA: Diagnosis not present

## 2013-09-05 DIAGNOSIS — G479 Sleep disorder, unspecified: Secondary | ICD-10-CM | POA: Diagnosis not present

## 2013-09-05 DIAGNOSIS — C8589 Other specified types of non-Hodgkin lymphoma, extranodal and solid organ sites: Secondary | ICD-10-CM | POA: Diagnosis not present

## 2013-09-05 DIAGNOSIS — E7800 Pure hypercholesterolemia, unspecified: Secondary | ICD-10-CM | POA: Diagnosis not present

## 2013-09-05 DIAGNOSIS — F411 Generalized anxiety disorder: Secondary | ICD-10-CM | POA: Diagnosis not present

## 2013-09-12 ENCOUNTER — Telehealth: Payer: Self-pay | Admitting: *Deleted

## 2013-09-12 ENCOUNTER — Telehealth: Payer: Self-pay | Admitting: Oncology

## 2013-09-12 ENCOUNTER — Ambulatory Visit (HOSPITAL_BASED_OUTPATIENT_CLINIC_OR_DEPARTMENT_OTHER): Payer: Medicare Other | Admitting: Nurse Practitioner

## 2013-09-12 ENCOUNTER — Other Ambulatory Visit (HOSPITAL_BASED_OUTPATIENT_CLINIC_OR_DEPARTMENT_OTHER): Payer: Medicare Other | Admitting: Lab

## 2013-09-12 VITALS — BP 121/79 | HR 104 | Temp 97.9°F | Resp 18 | Ht 63.0 in | Wt 234.3 lb

## 2013-09-12 DIAGNOSIS — I10 Essential (primary) hypertension: Secondary | ICD-10-CM

## 2013-09-12 DIAGNOSIS — Z853 Personal history of malignant neoplasm of breast: Secondary | ICD-10-CM

## 2013-09-12 DIAGNOSIS — C8581 Other specified types of non-Hodgkin lymphoma, lymph nodes of head, face, and neck: Secondary | ICD-10-CM

## 2013-09-12 DIAGNOSIS — C8589 Other specified types of non-Hodgkin lymphoma, extranodal and solid organ sites: Secondary | ICD-10-CM

## 2013-09-12 LAB — CBC WITH DIFFERENTIAL/PLATELET
BASO%: 1 % (ref 0.0–2.0)
HCT: 43.6 % (ref 34.8–46.6)
MCH: 29.3 pg (ref 25.1–34.0)
MCHC: 33 g/dL (ref 31.5–36.0)
MONO#: 0.4 10*3/uL (ref 0.1–0.9)
RBC: 4.91 10*6/uL (ref 3.70–5.45)
RDW: 15 % — ABNORMAL HIGH (ref 11.2–14.5)
WBC: 4.1 10*3/uL (ref 3.9–10.3)
lymph#: 0.6 10*3/uL — ABNORMAL LOW (ref 0.9–3.3)

## 2013-09-12 NOTE — Telephone Encounter (Signed)
Per staff message and POF I have scheduled appts.  JMW  

## 2013-09-12 NOTE — Progress Notes (Signed)
OFFICE PROGRESS NOTE  Interval history:  Crystal [name] returns for followup of non-Hodgkin's lymphoma. She completed cycle 1 bendamustine/Rituxan beginning 08/28/2013. She denies nausea/vomiting. No mouth sores. No diarrhea or constipation. No numbness or tingling in her hands or feet. She felt "weak" for one week after the treatment. She reports developing chills during the Rituxan infusion. She was given Solu-Medrol. The chills resolved and she was able to complete the infusion. She has periodic "hot flashes" at night. Within one week of the treatment the lymph nodes at the left neck were "gone". No difficulty swallowing. No neck pain.   Objective: Blood pressure 121/79, pulse 104, temperature 97.9 F (36.6 C), temperature source Oral, resp. rate 18, height 5\' 3"  (1.6 m), weight 234 lb 4.8 oz (106.278 kg).  Oropharynx is without thrush or ulceration. Small palpable lymph node at the left lower neck. No other palpable cervical, supraclavicular or axillary lymph nodes. Lungs are clear. No wheezes or rales. Regular cardiac rhythm. Port-A-Cath site is without erythema. Abdomen soft and nontender. Obese. No organomegaly. Extremities without edema.  Lab Results: Lab Results  Component Value Date   WBC 4.1 09/12/2013   HGB 14.4 09/12/2013   HCT 43.6 09/12/2013   MCV 88.8 09/12/2013   PLT 223 09/12/2013    Chemistry:    Chemistry      Component Value Date/Time   NA 142 08/21/2013 0931   NA 142 05/30/2013 1523   K 3.6 08/21/2013 0931   K 4.6 05/30/2013 1523   CL 106 05/30/2013 1523   CO2 25 08/21/2013 0931   CO2 31 05/30/2013 1523   BUN 12.7 08/21/2013 0931   BUN 16 05/30/2013 1523   CREATININE 0.9 08/21/2013 0931   CREATININE 1.07 05/30/2013 1523   CREATININE 1.09 12/22/2010 1548      Component Value Date/Time   CALCIUM 9.1 08/21/2013 0931   CALCIUM 9.2 05/30/2013 1523   ALKPHOS 108 08/21/2013 0931   ALKPHOS 75 12/22/2010 1548   AST 16 08/21/2013 0931   AST 23 12/22/2010 1548   ALT 14 08/21/2013 0931    ALT 15 12/22/2010 1548   BILITOT 0.32 08/21/2013 0931   BILITOT 0.4 12/22/2010 1548       Studies/Results: Nm Pet Image Restag (ps) Skull Base To Thigh  08/27/2013   CLINICAL DATA:  Subsequent treatment strategy for non-Hodgkin's lymphoma.  EXAM: NUCLEAR MEDICINE PET SKULL BASE TO THIGH  FASTING BLOOD GLUCOSE:  Value:  94 mg/dl  TECHNIQUE: 56.2 mCi Z-30 FDG was injected intravenously. CT data was obtained and used for attenuation correction and anatomic localization only. (This was not acquired as a diagnostic CT examination.) Additional exam technical data entered on technologist worksheet.  COMPARISON:  01/10/2013  FINDINGS: NECK  Numerous lymph nodes in the left neck extending to the left supraclavicular region, measuring up to 23 mm short axis (series 2/ image 32), max SUV 27.7.  CHEST  No suspicious pulmonary nodules on the CT scan.  10 mm short axis left IMA node (series 2/ image 88), max SUV 11.4.  Small right retrocrural nodes measuring up to 5 mm short axis (series 2/ image 130), max SUV 7.1.  ABDOMEN/PELVIS  No abnormal hypermetabolic activity within the liver, pancreas, adrenal glands, or spleen.  Multiple prominent retroperitoneal nodes measuring up to 14 mm short axis (series 2/image 163), max SUV 13.5.  Left external iliac nodal mass measuring 3.7 cm short axis (series 2/ image 203), max SUV 24.3. Right external iliac nodes measuring up to 17 mm short  axis (series 2/image 203), max SUV 24.6.  6 mm short axis right inguinal node with preservation of the normal fatty hilum (series 2/image 235), although mildly hypermetabolic, max SUV 4.0.  SKELETON  No focal hypermetabolic activity to suggest skeletal metastasis.  IMPRESSION: Findings compatible with lymphomatous recurrence.  Extensive left cervical/supraclavicular lymphadenopathy, max SUV 27.7.  Small left IMA and right retrocrural nodes, max SUV 11.4.  Extensive retroperitoneal/pelvic lymphadenopathy, max SUV 24.6.   Electronically Signed   By:  Charline Bills M.D.   On: 08/27/2013 17:10   Ir Fluoro Guide Cv Line Right  08/25/2013   CLINICAL DATA:  Lymphoma. The patient requires a porta cath to begin chemotherapy.  EXAM: IMPLANTED PORT A CATH PLACEMENT WITH ULTRASOUND AND FLUOROSCOPIC GUIDANCE  ANESTHESIA/SEDATION: 5.0 Mg IV Versed; 250 mcg IV Fentanyl  Total Moderate Sedation Time:  35 minutes  Additional Medications: 2 g IV Ancef. As antibiotic prophylaxis, Ancef was ordered pre-procedure and administered intravenously within one hour of incision.  FLUOROSCOPY TIME:  18 seconds.  PROCEDURE: The procedure, risks, benefits, and alternatives were explained to the patient. Questions regarding the procedure were encouraged and answered. The patient understands and consents to the procedure.  The right neck and chest were prepped with chlorhexidine in a sterile fashion, and a sterile drape was applied covering the operative field. Maximum barrier sterile technique with sterile gowns and gloves were used for the procedure. Local anesthesia was provided with 1% lidocaine and lidocaine with epinephrine.  Ultrasound was used to confirm patency of the right internal jugular vein. After creating a small venotomy incision, a 21 gauge needle was advanced into the right internal jugular vein under direct, real-time ultrasound guidance. Ultrasound image documentation was performed. After securing guidewire access, an 8 Fr dilator was placed. A J-wire was kinked to measure appropriate catheter length.  A subcutaneous port pocket was then created along the upper chest wall utilizing sharp and blunt dissection. Portable cautery was utilized. The pocket was irrigated with sterile saline.  A single lumen power injectable port was chosen for placement. The 8 Fr catheter was tunneled from the port pocket site to the venotomy incision. The port was placed in the pocket. External catheter was trimmed to appropriate length based on guidewire measurement.  At the venotomy, an  8 Fr peel-away sheath was placed over a guidewire. The catheter was then placed through the sheath and the sheath removed. Final catheter positioning was confirmed and documented with a fluoroscopic spot image. The port was accessed with a needle and aspirated and flushed with heparinized saline. The needle was removed.  The venotomy and port pocket incisions were closed with subcutaneous 3-0 Monocryl and subcuticular 4-0 Vicryl. Dermabond was applied to both incisions.  Complications: None. No pneumothorax.  FINDINGS: After catheter placement, the tip lies at the cavoatrial junction. The catheter aspirates normally and is ready for immediate use.  IMPRESSION: Placement of single lumen port a cath via right internal jugular vein. The catheter tip lies at the cavoatrial junction. A power injectable port a cath was placed and is ready for immediate use.   Electronically Signed   By: Irish Lack M.D.   On: 08/25/2013 14:31   Ir US Guide Vasc Access Right  08/25/2013   CLINICAL DATA:  Lymphoma. The patient requires a porta cath to begin chemotherapy.  EXAM: IMPLANTED PORT A CATH PLACEMENT WITH ULTRASOUND AND FLUOROSCOPIC GUIDANCE  ANESTHESIA/SEDATION: 5.0 Mg IV Versed; 250 mcg IV Fentanyl  Total Moderate Sedation Time:  35 minutes  Additional Medications: 2 g IV Ancef. As antibiotic prophylaxis, Ancef was ordered pre-procedure and administered intravenously within one hour of incision.  FLUOROSCOPY TIME:  18 seconds.  PROCEDURE: The procedure, risks, benefits, and alternatives were explained to the patient. Questions regarding the procedure were encouraged and answered. The patient understands and consents to the procedure.  The right neck and chest were prepped with chlorhexidine in a sterile fashion, and a sterile drape was applied covering the operative field. Maximum barrier sterile technique with sterile gowns and gloves were used for the procedure. Local anesthesia was provided with 1% lidocaine and  lidocaine with epinephrine.  Ultrasound was used to confirm patency of the right internal jugular vein. After creating a small venotomy incision, a 21 gauge needle was advanced into the right internal jugular vein under direct, real-time ultrasound guidance. Ultrasound image documentation was performed. After securing guidewire access, an 8 Fr dilator was placed. A J-wire was kinked to measure appropriate catheter length.  A subcutaneous port pocket was then created along the upper chest wall utilizing sharp and blunt dissection. Portable cautery was utilized. The pocket was irrigated with sterile saline.  A single lumen power injectable port was chosen for placement. The 8 Fr catheter was tunneled from the port pocket site to the venotomy incision. The port was placed in the pocket. External catheter was trimmed to appropriate length based on guidewire measurement.  At the venotomy, an 8 Fr peel-away sheath was placed over a guidewire. The catheter was then placed through the sheath and the sheath removed. Final catheter positioning was confirmed and documented with a fluoroscopic spot image. The port was accessed with a needle and aspirated and flushed with heparinized saline. The needle was removed.  The venotomy and port pocket incisions were closed with subcutaneous 3-0 Monocryl and subcuticular 4-0 Vicryl. Dermabond was applied to both incisions.  Complications: None. No pneumothorax.  FINDINGS: After catheter placement, the tip lies at the cavoatrial junction. The catheter aspirates normally and is ready for immediate use.  IMPRESSION: Placement of single lumen port a cath via right internal jugular vein. The catheter tip lies at the cavoatrial junction. A power injectable port a cath was placed and is ready for immediate use.   Electronically Signed   By: Irish Lack M.D.   On: 08/25/2013 14:31    Medications: I have reviewed the patient's current medications.  Assessment/Plan:  1. Low-grade  follicular lymphoma involving a right parotid mass, status post an excisional biopsy on 11/28/2010. a. Staging CT scans 01/03/2011 confirmed an increased number of small nodes in the neck, left axilla and pelvis without clear evidence of pathologic lymphadenopathy. b. PET scan 01/11/2011 confirmed hypermetabolic lymph nodes in the right cervical chain, left axillary nodes, periaortic, common iliac, external iliac and inguinal nodes. There was also a possible area of involvement at the right tonsillar region. c. Palpable left posterior cervical nodes confirmed on exam 05/15/2013- progressive left neck nodes on exam 08/18/2013. d. Status post cycle 1 bendamustine/Rituxan beginning 08/28/2013. e. Near-complete resolution of left neck adenopathy on exam 09/12/2013. 2. Stage I right-sided breast cancer diagnosed in 1998. 3. History of congestive heart failure. 4. Hypertension. 5. Port-A-Cath placement 08/25/2013 in interventional radiology. 6. Chills during the Rituxan infusion 08/28/2013. She was given Solu-Medrol. Rituxan was resumed and completed.  Disposition-she appears stable. There has been near-complete resolution of left neck adenopathy following cycle 1 bendamustine/Rituxan. She is scheduled to return to proceed with cycle 2 on 09/25/2013. We will see her prior to  cycle 3 on 10/28/2013. We will refer her for a restaging CT evaluation after completion of 4 cycles. She will contact the office prior to her next visit with any problems.  Plan reviewed with Dr. Truett Perna.    Crystal [name] ANP/GNP-BC

## 2013-09-12 NOTE — Telephone Encounter (Signed)
11/21 POF worked Appts made Email MW to add Jan Tx Dec and Jan cal given to pt shh

## 2013-09-15 ENCOUNTER — Encounter: Payer: Self-pay | Admitting: Cardiology

## 2013-09-15 ENCOUNTER — Ambulatory Visit (INDEPENDENT_AMBULATORY_CARE_PROVIDER_SITE_OTHER): Payer: Medicare Other | Admitting: Cardiology

## 2013-09-15 VITALS — BP 130/90 | HR 111 | Ht 65.0 in | Wt 237.2 lb

## 2013-09-15 DIAGNOSIS — I428 Other cardiomyopathies: Secondary | ICD-10-CM

## 2013-09-15 DIAGNOSIS — R0609 Other forms of dyspnea: Secondary | ICD-10-CM | POA: Diagnosis not present

## 2013-09-15 DIAGNOSIS — R0989 Other specified symptoms and signs involving the circulatory and respiratory systems: Secondary | ICD-10-CM

## 2013-09-15 DIAGNOSIS — R0600 Dyspnea, unspecified: Secondary | ICD-10-CM

## 2013-09-15 DIAGNOSIS — I10 Essential (primary) hypertension: Secondary | ICD-10-CM | POA: Diagnosis not present

## 2013-09-15 NOTE — Progress Notes (Signed)
Patient ID: Crystal [name], female   DOB: 1946/06/01, 67 y.o.   MRN: [id number]  PCP: Thora Lance, MD  Clinic Note: Chief Complaint  Patient presents with  . Follow-up    4 months:  No chest pain, edema.  Occas. shortness of breath w/activity.  Occas. dizziness.  Started Chemo early November for lymphoma.    HPI: Crystal [name] is a 67 y.o. female with a PMH notable for doxorubicin-induced nonischemic cardiomyopathy while being treated for breast cancer in 2009. She was a patient of Dr. Caprice Kluver, whom I taken over as the primary cardiologist. Her initial EF is roughly 40-45%, with no coronary disease on catheterization. Followup echocardiography revealed essentially resolved cardiomyopathy with EF of 50-55%. I last saw her in July, at that time he learned about her diagnosis of non-Hodgkin's lymphoma and started chemotherapy early November. Her first episode was last week. Up till that time she has been trying to walk roughly 25-30 minutes a day. Now with her treatment for lymphoma, she is not as active as she had been. She still tries to the [employer].  Interval History: Crystal [name] is doing pretty well, she initially had some trouble with her first treatment of the chemotherapy but after some adjustments were made and pre-treatments given, she did okay. Otherwise she is trying to get about doing what she can.   She does note occasional shortness of breath if she pushes up with activity. Nothing overtly abnormal. She denies any PND, orthopnea with maybe some mild lower extremity edema. She does have a lot of fatigue after chemotherapy. She denies any chest tightness or pressure with exertion but still has intermittent sensation of tightness in her chest at rest. She denies any palpitations or rapid heart beats. No syncope or near-syncope. No TIA or RCA symptoms. No melena, hematochezia or hematuria. No claudication symptoms.  Past Medical History  Diagnosis Date  . Hypertension   .  Lymphoma     lymphoma dx 11/28/10 - left neck  . non hodgkins lymphoma 12/2010  . Breast cancer 1998    (Rt) lumpectomy dx 1999; Dr. Truett Perna  . Nonischemic cardiomyopathy     ? Doxorubincin induced; essentially resolved as of echo in January 2014, current EF 50-55%. Grade 1 diastolic dysfunction.  . Dyslipidemia   . Anxiety   . Severe obesity (BMI >= 40) 05/21/2013   Prior Cardiac Evaluation and Past Surgical History: Past Surgical History  Procedure Laterality Date  . Abdominal hysterectomy    . Breast lumpectomy    . Cardiac catheterization  March 2009     None coronary disease, EF 45% (up from nuclear study EF of 30% in June '08)  . Doppler echocardiography  January 2014    EF 50-55%, no regional wall motion and melena. Grade 1 diastolic dysfunction, abnormal relaxation. Normal pressures. Mild aortic sclerosis, no stenosis.  . Portacath placement  August 25, 2013    rt. with tip in cavoatrial junction, Dr.Yamagata    Allergies  Allergen Reactions  . Codeine Other (See Comments)    Bad Headaches  . Coreg Other (See Comments)    "Made my legs hurt"  . Lisinopril Cough    Current Outpatient Prescriptions  Medication Sig Dispense Refill  . aspirin EC 81 MG tablet Take 81 mg by mouth every morning.      . chlorzoxazone (PARAFON) 500 MG tablet Take 500 mg by mouth 4 (four) times daily as needed (For leg tremors.).       Marland Kitchen  citalopram (CELEXA) 40 MG tablet Take 40 mg by mouth every morning.       . dicyclomine (BENTYL) 20 MG tablet Take 40 mg by mouth 2 (two) times daily.       . famotidine (PEPCID) 20 MG tablet Take 20 mg by mouth 2 (two) times daily.      Marland Kitchen lidocaine-prilocaine (EMLA) cream Apply 1 application topically daily as needed (For port-a-cath.).      Marland Kitchen LORazepam (ATIVAN) 0.5 MG tablet Take 0.5 mg by mouth 2 (two) times daily as needed for anxiety.      Marland Kitchen losartan-hydrochlorothiazide (HYZAAR) 50-12.5 MG per tablet Take 1 tablet by mouth every morning.       . metoprolol  tartrate (LOPRESSOR) 25 MG tablet Take 50 mg by mouth 2 (two) times daily.      . prochlorperazine (COMPAZINE) 5 MG tablet Take 1 to 2 tablets every 6 hours as needed foe nausea and vomiting  60 tablet  2  . temazepam (RESTORIL) 30 MG capsule Take 30 mg by mouth at bedtime.        No current facility-administered medications for this visit.   Social History Narrative   She is a widow mother of 2, with 1 child is deceased. She has 2 grandchildren. She is trying to get some walking in town and can do about 20-25 minutes his another 30 minutes she pushes a more night gets short of breath.   She is at retired [employer], he simply laid off after 45 years.   She usually comes accompanied by her sister, who is also my patient.   She never was a smoker, and only occasionally has a social alcoholic beverage.   ROS: A comprehensive Review of Systems - Negative except pertinent positives noted above. Noncardiac symptoms noted below.she just notes generalized fatigue and body aches as well as mild dizziness and wooziness on her chemotherapy and day 1 postoperative days. Hematological and Lymphatic ROS: positive for - swollen lymph nodes --already down since initial round of chemotherapy negative for - bleeding problems, blood clots, blood transfusions, bruising or night sweats Gastrointestinal ROS: positive for - gas/bloating, heartburn and Indigestion Musculoskeletal ROS: Bilateral hip discomfort with walking.  PHYSICAL EXAM BP 130/90  Pulse 111  Ht 5\' 5"  (1.651 m)  Wt 237 lb 3.2 oz (107.593 kg)  BMI 39.47 kg/m2 heart rate on and the exam was 94  General appearance: alert, cooperative, appears stated age, no distress, morbidly obese and Otherwise healthy-appearing. Well-groomed. Answers questions appropriately. Neck: moderate anterior cervical adenopathy, no carotid bruit, no JVD and supple, symmetrical, trachea midline Lungs: Essentially CTAB, mild basal crackles. Otherwise nonlabored  normal percussion, good air movement. Heart: RRR, S1, S2 normal, no murmur, click, rub or gallop and normal apical impulse Abdomen: soft, non-tender; bowel sounds normal; no masses,  no organomegaly and Significant truncal obesity Extremities: edema Trace,, varicose veins noted, but not engorged -- more like spider veins Pulses: 2+ and symmetric Neurologic: Grossly normal  WUJ:[id number] today: Yes Rate: 111 , Rhythm: Sinus tachycardia, nonspecific ST-T changes.  Recent Labs: None available.  ASSESSMENT: Relatively stable from a cardiac standpoint. Blood pressure and heart rate well controlled. No real active heart failure symptoms.  Nonischemic cardiomyopathy Essentially resolved. I do not think that her lymphoma chemotherapy is likely to cause a cardiomyopathy as compared to her breast cancer chemotherapy. She is not having any heart failure symptoms and her EF was notably improved on recent echo.  Hypertension Adequately controlled on current  meds.  It is recommended that she may want to hold her ARB on her chemotherapy days to avoid hypotension.  Dyspnea on exertion Again probably multifactorial. No signs that there is any heart failure concerns. I did not go into detail about her obesity this time but to briefly mention it and the need to potentially get back into exercising once her therapy is over.   PLAN: Per problem list. Orders Placed This Encounter  Procedures  . EKG 12-Lead   Followup: November 2014  Earon Rivest W. Herbie Baltimore, M.D., M.S. [facility] [address] [place], [place]  [address]  [phone] Pager # [phone]

## 2013-09-15 NOTE — Patient Instructions (Signed)
You seem to be doing OK from a heart perspective.  BP OK.Marland Kitchen Heart rate is up a bit, but better after resting.  I don't think this Chemotherapy effects the heart.    I would hold the Losartan-HCTZ on your chemotherapy days.  Lets follow-up in 6 months -- or sooner if needed.  Marykay Lex, MD

## 2013-09-17 NOTE — Assessment & Plan Note (Signed)
Essentially resolved. I do not think that her lymphoma chemotherapy is likely to cause a cardiomyopathy as compared to her breast cancer chemotherapy. She is not having any heart failure symptoms and her EF was notably improved on recent echo.

## 2013-09-17 NOTE — Assessment & Plan Note (Signed)
Again probably multifactorial. No signs that there is any heart failure concerns. I did not go into detail about her obesity this time but to briefly mention it and the need to potentially get back into exercising once her therapy is over.

## 2013-09-17 NOTE — Assessment & Plan Note (Signed)
Adequately controlled on current meds.  It is recommended that she may want to hold her ARB on her chemotherapy days to avoid hypotension.

## 2013-09-21 ENCOUNTER — Other Ambulatory Visit: Payer: Self-pay | Admitting: Oncology

## 2013-09-25 ENCOUNTER — Ambulatory Visit (HOSPITAL_BASED_OUTPATIENT_CLINIC_OR_DEPARTMENT_OTHER): Payer: Medicare Other | Admitting: Lab

## 2013-09-25 ENCOUNTER — Ambulatory Visit (HOSPITAL_BASED_OUTPATIENT_CLINIC_OR_DEPARTMENT_OTHER): Payer: Medicare Other

## 2013-09-25 ENCOUNTER — Encounter: Payer: Self-pay | Admitting: *Deleted

## 2013-09-25 ENCOUNTER — Ambulatory Visit: Payer: Medicare Other

## 2013-09-25 VITALS — BP 98/60 | HR 81 | Temp 98.0°F | Resp 18

## 2013-09-25 DIAGNOSIS — C8589 Other specified types of non-Hodgkin lymphoma, extranodal and solid organ sites: Secondary | ICD-10-CM

## 2013-09-25 DIAGNOSIS — C8291 Follicular lymphoma, unspecified, lymph nodes of head, face, and neck: Secondary | ICD-10-CM

## 2013-09-25 DIAGNOSIS — Z5112 Encounter for antineoplastic immunotherapy: Secondary | ICD-10-CM

## 2013-09-25 DIAGNOSIS — Z5111 Encounter for antineoplastic chemotherapy: Secondary | ICD-10-CM

## 2013-09-25 LAB — CBC WITH DIFFERENTIAL/PLATELET
Basophils Absolute: 0.1 10*3/uL (ref 0.0–0.1)
Eosinophils Absolute: 0.1 10*3/uL (ref 0.0–0.5)
HCT: 40.3 % (ref 34.8–46.6)
HGB: 13.1 g/dL (ref 11.6–15.9)
MONO#: 0.5 10*3/uL (ref 0.1–0.9)
NEUT#: 2.2 10*3/uL (ref 1.5–6.5)
NEUT%: 65.8 % (ref 38.4–76.8)
WBC: 3.4 10*3/uL — ABNORMAL LOW (ref 3.9–10.3)
lymph#: 0.5 10*3/uL — ABNORMAL LOW (ref 0.9–3.3)

## 2013-09-25 MED ORDER — HEPARIN SOD (PORK) LOCK FLUSH 100 UNIT/ML IV SOLN
500.0000 [IU] | Freq: Once | INTRAVENOUS | Status: AC | PRN
  Administered 2013-09-25: 500 [IU]
  Filled 2013-09-25: qty 5

## 2013-09-25 MED ORDER — DEXAMETHASONE SODIUM PHOSPHATE 10 MG/ML IJ SOLN
INTRAMUSCULAR | Status: AC
  Filled 2013-09-25: qty 1

## 2013-09-25 MED ORDER — ONDANSETRON 8 MG/50ML IVPB (CHCC)
8.0000 mg | Freq: Once | INTRAVENOUS | Status: AC
  Administered 2013-09-25: 8 mg via INTRAVENOUS

## 2013-09-25 MED ORDER — SODIUM CHLORIDE 0.9 % IV SOLN
Freq: Once | INTRAVENOUS | Status: AC
  Administered 2013-09-25: 10:00:00 via INTRAVENOUS

## 2013-09-25 MED ORDER — SODIUM CHLORIDE 0.9 % IV SOLN
90.0000 mg/m2 | Freq: Once | INTRAVENOUS | Status: AC
  Administered 2013-09-25: 195 mg via INTRAVENOUS
  Filled 2013-09-25: qty 39

## 2013-09-25 MED ORDER — ONDANSETRON 8 MG/NS 50 ML IVPB
INTRAVENOUS | Status: AC
  Filled 2013-09-25: qty 8

## 2013-09-25 MED ORDER — DEXAMETHASONE SODIUM PHOSPHATE 10 MG/ML IJ SOLN
10.0000 mg | Freq: Once | INTRAMUSCULAR | Status: AC
  Administered 2013-09-25: 10 mg via INTRAVENOUS

## 2013-09-25 MED ORDER — ACETAMINOPHEN 325 MG PO TABS
650.0000 mg | ORAL_TABLET | Freq: Once | ORAL | Status: AC
  Administered 2013-09-25: 650 mg via ORAL

## 2013-09-25 MED ORDER — SODIUM CHLORIDE 0.9 % IV SOLN
375.0000 mg/m2 | Freq: Once | INTRAVENOUS | Status: DC
  Filled 2013-09-25: qty 80

## 2013-09-25 MED ORDER — SODIUM CHLORIDE 0.9 % IJ SOLN
10.0000 mL | INTRAMUSCULAR | Status: DC | PRN
  Administered 2013-09-25: 10 mL
  Filled 2013-09-25: qty 10

## 2013-09-25 MED ORDER — DIPHENHYDRAMINE HCL 25 MG PO CAPS
50.0000 mg | ORAL_CAPSULE | Freq: Once | ORAL | Status: AC
  Administered 2013-09-25: 50 mg via ORAL

## 2013-09-25 MED ORDER — SODIUM CHLORIDE 0.9 % IV SOLN
375.0000 mg/m2 | Freq: Once | INTRAVENOUS | Status: AC
  Administered 2013-09-25: 800 mg via INTRAVENOUS
  Filled 2013-09-25: qty 80

## 2013-09-25 NOTE — Progress Notes (Signed)
Patient c/o 8/10 lower left-mid ABD pain, aching in nature, and occurs all of a sudden. She denies gas, diarrhea, constipation, or n/v. No associated fever or feeling ill. Patient wonders if her recent PET showed anything of note. Dr. Truett Perna notified. MD does not suspect her lymphoma is causing this pain. Per MD, have patient monitor pain and report worsening or persistent pain to RN. Patient informed and she verbalizes understanding. Will ask MD to assess patient while in infusion room if pain persists during her chair appointment.   Pre-Rituxan vs noted BP 81/50 via machine then 98/60 manual. Patient AAO, other vs stable. MD aware that patient did not take BP med today prior to arrival to infusion room. Per MD OK to proceed with Rutixan, monitor closely, and report any abnormal findings to MD. Patient understands importance of voicing any complaints during infusion.   Patient tolerated complete dose of rituxan, beginning at 50 mg/hr, then increasing to 100 mg/hr, 200 mg/hr, 300 mg/hr, and 400 mg/hr at 30 minute increments. VS remained stable, no distress noted and no complaints. Infusion completed without pause. Treanda infused without complaints. Patient discharged to home in stable condition, AAO.

## 2013-09-25 NOTE — Patient Instructions (Signed)
[facility] Discharge Instructions for Patients Receiving Chemotherapy  Today you received the following chemotherapy agents Rituxan and Treanda. To help prevent nausea and vomiting after your treatment, we encourage you to take your nausea medication as prescribed.   If you develop nausea and vomiting that is not controlled by your nausea medication, call the clinic.   BELOW ARE SYMPTOMS THAT SHOULD BE REPORTED IMMEDIATELY:  *FEVER GREATER THAN 100.5 F  *CHILLS WITH OR WITHOUT FEVER  NAUSEA AND VOMITING THAT IS NOT CONTROLLED WITH YOUR NAUSEA MEDICATION  *UNUSUAL SHORTNESS OF BREATH  *UNUSUAL BRUISING OR BLEEDING  TENDERNESS IN MOUTH AND THROAT WITH OR WITHOUT PRESENCE OF ULCERS  *URINARY PROBLEMS  *BOWEL PROBLEMS  UNUSUAL RASH Items with * indicate a potential emergency and should be followed up as soon as possible.  Feel free to call the clinic you have any questions or concerns. The clinic phone number is [phone].    

## 2013-09-26 ENCOUNTER — Ambulatory Visit (HOSPITAL_BASED_OUTPATIENT_CLINIC_OR_DEPARTMENT_OTHER): Payer: Medicare Other

## 2013-09-26 VITALS — BP 114/73 | HR 92 | Temp 98.2°F | Resp 21

## 2013-09-26 DIAGNOSIS — C8589 Other specified types of non-Hodgkin lymphoma, extranodal and solid organ sites: Secondary | ICD-10-CM

## 2013-09-26 DIAGNOSIS — C8291 Follicular lymphoma, unspecified, lymph nodes of head, face, and neck: Secondary | ICD-10-CM | POA: Diagnosis not present

## 2013-09-26 DIAGNOSIS — Z5111 Encounter for antineoplastic chemotherapy: Secondary | ICD-10-CM | POA: Diagnosis not present

## 2013-09-26 MED ORDER — ONDANSETRON 8 MG/50ML IVPB (CHCC)
8.0000 mg | Freq: Once | INTRAVENOUS | Status: AC
  Administered 2013-09-26: 8 mg via INTRAVENOUS

## 2013-09-26 MED ORDER — DEXAMETHASONE SODIUM PHOSPHATE 10 MG/ML IJ SOLN
INTRAMUSCULAR | Status: AC
  Filled 2013-09-26: qty 1

## 2013-09-26 MED ORDER — ONDANSETRON 8 MG/NS 50 ML IVPB
INTRAVENOUS | Status: AC
  Filled 2013-09-26: qty 8

## 2013-09-26 MED ORDER — SODIUM CHLORIDE 0.9 % IJ SOLN
10.0000 mL | INTRAMUSCULAR | Status: DC | PRN
  Administered 2013-09-26: 10 mL
  Filled 2013-09-26: qty 10

## 2013-09-26 MED ORDER — HEPARIN SOD (PORK) LOCK FLUSH 100 UNIT/ML IV SOLN
500.0000 [IU] | Freq: Once | INTRAVENOUS | Status: AC | PRN
  Administered 2013-09-26: 500 [IU]
  Filled 2013-09-26: qty 5

## 2013-09-26 MED ORDER — SODIUM CHLORIDE 0.9 % IV SOLN
Freq: Once | INTRAVENOUS | Status: AC
  Administered 2013-09-26: 09:00:00 via INTRAVENOUS

## 2013-09-26 MED ORDER — SODIUM CHLORIDE 0.9 % IV SOLN
90.0000 mg/m2 | Freq: Once | INTRAVENOUS | Status: AC
  Administered 2013-09-26: 195 mg via INTRAVENOUS
  Filled 2013-09-26: qty 39

## 2013-09-26 MED ORDER — DEXAMETHASONE SODIUM PHOSPHATE 10 MG/ML IJ SOLN
10.0000 mg | Freq: Once | INTRAMUSCULAR | Status: AC
  Administered 2013-09-26: 10 mg via INTRAVENOUS

## 2013-09-26 NOTE — Patient Instructions (Signed)
Bendamustine Injection What is this medicine? BENDAMUSTINE (BEN da MUS teen) is a chemotherapy drug. It is used to treat chronic lymphocytic leukemia and non-Hodgkin lymphoma. This medicine may be used for other purposes; ask your health care provider or pharmacist if you have questions. COMMON BRAND NAME(S): Treanda What should I tell my health care provider before I take this medicine? They need to know if you have any of these conditions: -kidney disease -liver disease -an unusual or allergic reaction to bendamustine, mannitol, other medicines, foods, dyes, or preservatives -pregnant or trying to get pregnant -breast-feeding How should I use this medicine? This medicine is for infusion into a vein. It is given by a health care professional in a hospital or clinic setting. Talk to your pediatrician regarding the use of this medicine in children. Special care may be needed. Overdosage: If you think you have taken too much of this medicine contact a poison control center or emergency room at once. NOTE: This medicine is only for you. Do not share this medicine with others. What if I miss a dose? It is important not to miss your dose. Call your doctor or health care professional if you are unable to keep an appointment. What may interact with this medicine? Do not take this medicine with any of the following medications: -clozapine This medicine may also interact with the following medications: -atazanavir -cimetidine -ciprofloxacin -enoxacin -fluvoxamine -medicines for seizures like carbamazepine and phenobarbital -mexiletine -rifampin -tacrine -thiabendazole -zileuton This list may not describe all possible interactions. Give your health care provider a list of all the medicines, herbs, non-prescription drugs, or dietary supplements you use. Also tell them if you smoke, drink alcohol, or use illegal drugs. Some items may interact with your medicine. What should I watch for while  using this medicine? Your condition will be monitored carefully while you are receiving this medicine. This drug may make you feel generally unwell. This is not uncommon, as chemotherapy can affect healthy cells as well as cancer cells. Report any side effects. Continue your course of treatment even though you feel ill unless your doctor tells you to stop. Call your doctor or health care professional for advice if you get a fever, chills or sore throat, or other symptoms of a cold or flu. Do not treat yourself. This drug decreases your body's ability to fight infections. Try to avoid being around people who are sick. This medicine may increase your risk to bruise or bleed. Call your doctor or health care professional if you notice any unusual bleeding. Be careful brushing and flossing your teeth or using a toothpick because you may get an infection or bleed more easily. If you have any dental work done, tell your dentist you are receiving this medicine. Avoid taking products that contain aspirin, acetaminophen, ibuprofen, naproxen, or ketoprofen unless instructed by your doctor. These medicines may hide a fever. Do not become pregnant while taking this medicine. Women should inform their doctor if they wish to become pregnant or think they might be pregnant. There is a potential for serious side effects to an unborn child. Men should inform their doctors if they wish to father a child. This medicine may lower sperm counts. Talk to your health care professional or pharmacist for more information. Do not breast-feed an infant while taking this medicine. What side effects may I notice from receiving this medicine? Side effects that you should report to your doctor or health care professional as soon as possible: -allergic reactions like skin rash,   itching or hives, swelling of the face, lips, or tongue -low blood counts - this medicine may decrease the number of white blood cells, red blood cells and  platelets. You may be at increased risk for infections and bleeding. -signs of infection - fever or chills, cough, sore throat, pain or difficulty passing urine -signs of decreased platelets or bleeding - bruising, pinpoint red spots on the skin, black, tarry stools, blood in the urine -signs of decreased red blood cells - unusually weak or tired, fainting spells, lightheadedness -trouble passing urine or change in the amount of urine Side effects that usually do not require medical attention (report to your doctor or health care professional if they continue or are bothersome): -diarrhea This list may not describe all possible side effects. Call your doctor for medical advice about side effects. You may report side effects to FDA at 1-800-FDA-1088. Where should I keep my medicine? This drug is given in a hospital or clinic and will not be stored at home. NOTE: This sheet is a summary. It may not cover all possible information. If you have questions about this medicine, talk to your doctor, pharmacist, or health care provider.  2014, [employer]vier/[employer] Standard. (2012-01-05 14:15:47)  

## 2013-09-30 ENCOUNTER — Other Ambulatory Visit: Payer: Self-pay | Admitting: *Deleted

## 2013-09-30 ENCOUNTER — Telehealth: Payer: Self-pay | Admitting: *Deleted

## 2013-09-30 MED ORDER — TRAMADOL HCL 50 MG PO TABS: 50.0000 mg | ORAL_TABLET | Freq: Four times a day (QID) | ORAL | Status: DC | PRN

## 2013-09-30 NOTE — Telephone Encounter (Signed)
Per MD, notified pt that Dr. Truett Perna recommends to come in to be evaluated; appt for 10/01/13 @ 9:45.  Pt verbalized understanding and stated she can be here tomorrow.  Pt also states that she ate lunch with grand-daughter--not nauseous but still has stomach pain.  Notified pt pain med Tramadol 50 mg take 1 tablet every 6 hours as needed for pain will be called in to her pharmacy but if worse throughout the night- go to ED. Pt states pain is "5" on pain scale 0-10 "been around that most of the time".   Pt verbalized understanding of instructions.  POF complete

## 2013-09-30 NOTE — Telephone Encounter (Signed)
Pt called reports "my stomach is bothering me and I'm real weak"  Pt had treatment 12/4, 12/5; denies fever, N/V; reports having normal BM this am; drinking lots of fluids; has ate granola bar for breakfast and getting ready to eat lunch.  States she took compazine but "that didn't help and its more pain in my stomach than nausea"  Has taken her Pepcid as well.  "what can i do for my stomach"  Note to Dr. Truett Perna.

## 2013-10-01 ENCOUNTER — Ambulatory Visit (HOSPITAL_BASED_OUTPATIENT_CLINIC_OR_DEPARTMENT_OTHER): Payer: Medicare Other | Admitting: Nurse Practitioner

## 2013-10-01 ENCOUNTER — Ambulatory Visit (HOSPITAL_COMMUNITY)
Admission: RE | Admit: 2013-10-01 | Discharge: 2013-10-01 | Disposition: A | Discharge status: Home / Self Care | Payer: Medicare Other | Source: Ambulatory Visit | Attending: Nurse Practitioner | Admitting: Nurse Practitioner

## 2013-10-01 ENCOUNTER — Encounter (HOSPITAL_COMMUNITY): Payer: Self-pay

## 2013-10-01 ENCOUNTER — Telehealth: Payer: Self-pay | Admitting: Oncology

## 2013-10-01 ENCOUNTER — Ambulatory Visit (HOSPITAL_BASED_OUTPATIENT_CLINIC_OR_DEPARTMENT_OTHER): Payer: Medicare Other

## 2013-10-01 VITALS — BP 130/79 | HR 104 | Temp 96.9°F | Resp 20 | Ht 65.0 in | Wt 235.5 lb

## 2013-10-01 DIAGNOSIS — C8589 Other specified types of non-Hodgkin lymphoma, extranodal and solid organ sites: Secondary | ICD-10-CM

## 2013-10-01 DIAGNOSIS — R109 Unspecified abdominal pain: Secondary | ICD-10-CM

## 2013-10-01 DIAGNOSIS — M51379 Other intervertebral disc degeneration, lumbosacral region without mention of lumbar back pain or lower extremity pain: Secondary | ICD-10-CM | POA: Insufficient documentation

## 2013-10-01 DIAGNOSIS — I700 Atherosclerosis of aorta: Secondary | ICD-10-CM | POA: Insufficient documentation

## 2013-10-01 DIAGNOSIS — R599 Enlarged lymph nodes, unspecified: Secondary | ICD-10-CM | POA: Diagnosis not present

## 2013-10-01 DIAGNOSIS — Z79899 Other long term (current) drug therapy: Secondary | ICD-10-CM | POA: Diagnosis not present

## 2013-10-01 DIAGNOSIS — N289 Disorder of kidney and ureter, unspecified: Secondary | ICD-10-CM | POA: Diagnosis not present

## 2013-10-01 DIAGNOSIS — R1031 Right lower quadrant pain: Secondary | ICD-10-CM | POA: Insufficient documentation

## 2013-10-01 DIAGNOSIS — K7689 Other specified diseases of liver: Secondary | ICD-10-CM | POA: Diagnosis not present

## 2013-10-01 DIAGNOSIS — M5137 Other intervertebral disc degeneration, lumbosacral region: Secondary | ICD-10-CM | POA: Insufficient documentation

## 2013-10-01 DIAGNOSIS — R110 Nausea: Secondary | ICD-10-CM | POA: Diagnosis not present

## 2013-10-01 DIAGNOSIS — K449 Diaphragmatic hernia without obstruction or gangrene: Secondary | ICD-10-CM | POA: Insufficient documentation

## 2013-10-01 DIAGNOSIS — K769 Liver disease, unspecified: Secondary | ICD-10-CM | POA: Diagnosis not present

## 2013-10-01 LAB — COMPREHENSIVE METABOLIC PANEL (CC13)
ALT: 30 U/L (ref 0–55)
AST: 18 U/L (ref 5–34)
Anion Gap: 13 mEq/L — ABNORMAL HIGH (ref 3–11)
BUN: 20.5 mg/dL (ref 7.0–26.0)
CO2: 25 mEq/L (ref 22–29)
Calcium: 9 mg/dL (ref 8.4–10.4)
Chloride: 104 mEq/L (ref 98–109)
Creatinine: 1 mg/dL (ref 0.6–1.1)
Glucose: 112 mg/dl (ref 70–140)
Total Bilirubin: 0.41 mg/dL (ref 0.20–1.20)

## 2013-10-01 LAB — CBC WITH DIFFERENTIAL/PLATELET
BASO%: 0.6 % (ref 0.0–2.0)
Basophils Absolute: 0 10*3/uL (ref 0.0–0.1)
Eosinophils Absolute: 0 10*3/uL (ref 0.0–0.5)
HCT: 46.7 % — ABNORMAL HIGH (ref 34.8–46.6)
HGB: 15.5 g/dL (ref 11.6–15.9)
LYMPH%: 16.6 % (ref 14.0–49.7)
MCHC: 33.1 g/dL (ref 31.5–36.0)
MONO#: 0.4 10*3/uL (ref 0.1–0.9)
NEUT#: 2.6 10*3/uL (ref 1.5–6.5)
NEUT%: 71.4 % (ref 38.4–76.8)
Platelets: 165 10*3/uL (ref 145–400)
WBC: 3.7 10*3/uL — ABNORMAL LOW (ref 3.9–10.3)
lymph#: 0.6 10*3/uL — ABNORMAL LOW (ref 0.9–3.3)

## 2013-10-01 MED ORDER — IOHEXOL 300 MG/ML  SOLN
50.0000 mL | Freq: Once | INTRAMUSCULAR | Status: AC | PRN
  Administered 2013-10-01: 50 mL via ORAL

## 2013-10-01 MED ORDER — IOHEXOL 300 MG/ML  SOLN
100.0000 mL | Freq: Once | INTRAMUSCULAR | Status: AC | PRN
  Administered 2013-10-01: 100 mL via INTRAVENOUS

## 2013-10-01 MED ORDER — PANTOPRAZOLE SODIUM 40 MG PO TBEC: 40.0000 mg | DELAYED_RELEASE_TABLET | Freq: Every day | ORAL | Status: DC

## 2013-10-01 NOTE — Progress Notes (Addendum)
OFFICE PROGRESS NOTE  Interval history:  Crystal [name] is a 67 year old woman with non-Hodgkin's lymphoma currently on active treatment with bendamustine/Rituxan. She completed cycle 2 beginning 09/25/2013.  She is seen today in an unscheduled visit for evaluation of abdominal pain. She reports the abdominal pain began during the first or second day of cycle 2 while in the office. She describes the pain as "aching". The pain is present constantly. There are no exacerbating or relieving factors. She denies constipation. No diarrhea. Bowels moving regularly. She denies nausea/vomiting. She tried some nausea medication with no improvement in the pain. She also tried tramadol without improvement. The pain is not affected by oral intake. She denies fever. She has occasional hot flashes. She has stable mild dyspnea on exertion.    Objective: Blood pressure 130/79, pulse 104, temperature 96.9 F (36.1 C), temperature source Oral, resp. rate 20, height 5\' 5"  (1.651 m), weight 235 lb 8 oz (106.822 kg).  No thrush or ulcerations. No palpable cervical, supraclavicular or axillary lymph nodes. Lungs are clear. Heart is regular, tachycardic. Port-A-Cath site without erythema. Abdomen is soft, obese. Bowel sounds are active. No obvious mass organomegaly. Tender at the right mid abdomen. No leg edema  Lab Results: Lab Results  Component Value Date   WBC 3.7* 10/01/2013   HGB 15.5 10/01/2013   HCT 46.7* 10/01/2013   MCV 88.8 10/01/2013   PLT 165 10/01/2013    Chemistry:    Chemistry      Component Value Date/Time   NA 142 08/21/2013 0931   NA 142 05/30/2013 1523   K 3.6 08/21/2013 0931   K 4.6 05/30/2013 1523   CL 106 05/30/2013 1523   CO2 25 08/21/2013 0931   CO2 31 05/30/2013 1523   BUN 12.7 08/21/2013 0931   BUN 16 05/30/2013 1523   CREATININE 0.9 08/21/2013 0931   CREATININE 1.07 05/30/2013 1523   CREATININE 1.09 12/22/2010 1548      Component Value Date/Time   CALCIUM 9.1 08/21/2013 0931   CALCIUM 9.2  05/30/2013 1523   ALKPHOS 108 08/21/2013 0931   ALKPHOS 75 12/22/2010 1548   AST 16 08/21/2013 0931   AST 23 12/22/2010 1548   ALT 14 08/21/2013 0931   ALT 15 12/22/2010 1548   BILITOT 0.32 08/21/2013 0931   BILITOT 0.4 12/22/2010 1548       Studies/Results: No results found.  Medications: I have reviewed the patient's current medications.  Assessment/Plan:  1. Low-grade follicular lymphoma involving a right parotid mass, status post an excisional biopsy on 11/28/2010. a. Staging CT scans 01/03/2011 confirmed an increased number of small nodes in the neck, left axilla and pelvis without clear evidence of pathologic lymphadenopathy. b. PET scan 01/11/2011 confirmed hypermetabolic lymph nodes in the right cervical chain, left axillary nodes, periaortic, common iliac, external iliac and inguinal nodes. There was also a possible area of involvement at the right tonsillar region. c. Palpable left posterior cervical nodes confirmed on exam 05/15/2013- progressive left neck nodes on exam 08/18/2013. d. Status post cycle 1 bendamustine/Rituxan beginning 08/28/2013. e. Near-complete resolution of left neck adenopathy on exam 09/12/2013. f. Status post cycle 2 bendamustine/Rituxan beginning 09/25/2013. 2. Stage I right-sided breast cancer diagnosed in 1998. 3. History of congestive heart failure. 4. Hypertension. 5. Port-A-Cath placement 08/25/2013 in interventional radiology. 6. Chills during the Rituxan infusion 08/28/2013. She was given Solu-Medrol. Rituxan was resumed and completed. 7. Approximate 1 week history of abdominal pain.  Disposition-the etiology of the abdominal pain is unclear. We are  referring her for CT scan of the abdomen/pelvis to evaluate further. She will return to the office following the scan.  Crystal [name]    Addendum-the CT scan shows no explanation for the pain. She was noted to have a near complete response to treatment with resolution of previously  identified retroperitoneal and right-sided pelvic adenopathy and improvement in the size of an enlarged left iliac chain lymph node. We will change her antacid from Pepcid to Protonix 40 mg daily. If the pain persists we will refer her to gastroenterology.   Patient seen with Dr. Truett Perna.  Crystal [name]  This was a shared visit with Crystal [name]. Crystal [name] was interviewed and examined. No explanation for her pain. I doubt the pain is related to non-Hodgkin's lymphoma or the recent systemic therapy. We decided to try antacid therapy. If this does not help she will be referred to gastroenterology.  Crystal [name], M.D.

## 2013-10-01 NOTE — Telephone Encounter (Signed)
pt has already had scan and lb as indicated per 12/10 pof - appts done today. no other orders.

## 2013-10-02 ENCOUNTER — Telehealth: Payer: Self-pay | Admitting: *Deleted

## 2013-10-02 NOTE — Telephone Encounter (Signed)
Called patient to check on pain status.  Patient stated pain is getting better in her abdomen and she thinks her pain is due to eating greasy and spicy foods.  Informed her to call if pain worsens.  Patient verbalized understanding.

## 2013-10-17 ENCOUNTER — Telehealth: Payer: Self-pay | Admitting: Oncology

## 2013-10-17 ENCOUNTER — Other Ambulatory Visit: Payer: Self-pay | Admitting: Nurse Practitioner

## 2013-10-17 DIAGNOSIS — C8589 Other specified types of non-Hodgkin lymphoma, extranodal and solid organ sites: Secondary | ICD-10-CM

## 2013-10-17 NOTE — Telephone Encounter (Signed)
Talked to pt and gave her appt for lab ,md and chemo for 10/28/13

## 2013-10-19 ENCOUNTER — Other Ambulatory Visit: Payer: Self-pay | Admitting: Oncology

## 2013-10-21 ENCOUNTER — Other Ambulatory Visit: Payer: Self-pay | Admitting: Cardiology

## 2013-10-21 NOTE — Telephone Encounter (Signed)
Rx was sent to pharmacy electronically. 

## 2013-10-22 ENCOUNTER — Ambulatory Visit
Admission: RE | Admit: 2013-10-22 | Discharge: 2013-10-22 | Disposition: A | Discharge status: Home / Self Care | Payer: Medicare Other | Source: Ambulatory Visit

## 2013-10-22 DIAGNOSIS — Z1231 Encounter for screening mammogram for malignant neoplasm of breast: Secondary | ICD-10-CM

## 2013-10-27 ENCOUNTER — Other Ambulatory Visit: Payer: Self-pay | Admitting: Oncology

## 2013-10-27 DIAGNOSIS — C8589 Other specified types of non-Hodgkin lymphoma, extranodal and solid organ sites: Secondary | ICD-10-CM

## 2013-10-28 ENCOUNTER — Telehealth: Payer: Self-pay | Admitting: Oncology

## 2013-10-28 ENCOUNTER — Ambulatory Visit (HOSPITAL_BASED_OUTPATIENT_CLINIC_OR_DEPARTMENT_OTHER): Payer: Medicare Other | Admitting: Oncology

## 2013-10-28 ENCOUNTER — Telehealth: Payer: Self-pay | Admitting: *Deleted

## 2013-10-28 ENCOUNTER — Other Ambulatory Visit (HOSPITAL_BASED_OUTPATIENT_CLINIC_OR_DEPARTMENT_OTHER): Payer: Medicare Other

## 2013-10-28 ENCOUNTER — Ambulatory Visit (HOSPITAL_BASED_OUTPATIENT_CLINIC_OR_DEPARTMENT_OTHER): Payer: Medicare Other

## 2013-10-28 VITALS — BP 102/65 | HR 81 | Temp 98.1°F | Resp 18

## 2013-10-28 VITALS — BP 124/69 | HR 79 | Temp 96.8°F | Resp 18 | Ht 65.0 in | Wt 238.3 lb

## 2013-10-28 DIAGNOSIS — C8581 Other specified types of non-Hodgkin lymphoma, lymph nodes of head, face, and neck: Secondary | ICD-10-CM

## 2013-10-28 DIAGNOSIS — Z853 Personal history of malignant neoplasm of breast: Secondary | ICD-10-CM

## 2013-10-28 DIAGNOSIS — C8589 Other specified types of non-Hodgkin lymphoma, extranodal and solid organ sites: Secondary | ICD-10-CM

## 2013-10-28 DIAGNOSIS — Z5111 Encounter for antineoplastic chemotherapy: Secondary | ICD-10-CM

## 2013-10-28 DIAGNOSIS — I10 Essential (primary) hypertension: Secondary | ICD-10-CM | POA: Diagnosis not present

## 2013-10-28 LAB — CBC WITH DIFFERENTIAL/PLATELET
BASO%: 0.8 % (ref 0.0–2.0)
Basophils Absolute: 0 10*3/uL (ref 0.0–0.1)
EOS ABS: 0.1 10*3/uL (ref 0.0–0.5)
EOS%: 1.4 % (ref 0.0–7.0)
HCT: 41.1 % (ref 34.8–46.6)
HGB: 13.7 g/dL (ref 11.6–15.9)
LYMPH%: 48.6 % (ref 14.0–49.7)
MCH: 30.1 pg (ref 25.1–34.0)
MCHC: 33.4 g/dL (ref 31.5–36.0)
MCV: 90.1 fL (ref 79.5–101.0)
MONO#: 0.6 10*3/uL (ref 0.1–0.9)
MONO%: 11.5 % (ref 0.0–14.0)
NEUT%: 37.7 % — ABNORMAL LOW (ref 38.4–76.8)
NEUTROS ABS: 2 10*3/uL (ref 1.5–6.5)
PLATELETS: 228 10*3/uL (ref 145–400)
RBC: 4.57 10*6/uL (ref 3.70–5.45)
RDW: 15.7 % — ABNORMAL HIGH (ref 11.2–14.5)
WBC: 5.4 10*3/uL (ref 3.9–10.3)
lymph#: 2.6 10*3/uL (ref 0.9–3.3)

## 2013-10-28 LAB — COMPREHENSIVE METABOLIC PANEL (CC13)
ALK PHOS: 111 U/L (ref 40–150)
ALT: 26 U/L (ref 0–55)
AST: 25 U/L (ref 5–34)
Albumin: 3.2 g/dL — ABNORMAL LOW (ref 3.5–5.0)
Anion Gap: 8 mEq/L (ref 3–11)
BUN: 13.8 mg/dL (ref 7.0–26.0)
CO2: 28 mEq/L (ref 22–29)
CREATININE: 1 mg/dL (ref 0.6–1.1)
Calcium: 8.9 mg/dL (ref 8.4–10.4)
Chloride: 105 mEq/L (ref 98–109)
GLUCOSE: 99 mg/dL (ref 70–140)
Potassium: 4 mEq/L (ref 3.5–5.1)
Sodium: 142 mEq/L (ref 136–145)
Total Bilirubin: 0.34 mg/dL (ref 0.20–1.20)
Total Protein: 6.6 g/dL (ref 6.4–8.3)

## 2013-10-28 MED ORDER — SODIUM CHLORIDE 0.9 % IV SOLN
375.0000 mg/m2 | Freq: Once | INTRAVENOUS | Status: AC
  Administered 2013-10-28: 800 mg via INTRAVENOUS
  Filled 2013-10-28: qty 80

## 2013-10-28 MED ORDER — ACETAMINOPHEN 325 MG PO TABS
ORAL_TABLET | ORAL | Status: AC
  Filled 2013-10-28: qty 2

## 2013-10-28 MED ORDER — DIPHENHYDRAMINE HCL 25 MG PO CAPS
ORAL_CAPSULE | ORAL | Status: AC
  Filled 2013-10-28: qty 2

## 2013-10-28 MED ORDER — HEPARIN SOD (PORK) LOCK FLUSH 100 UNIT/ML IV SOLN
500.0000 [IU] | Freq: Once | INTRAVENOUS | Status: AC | PRN
  Administered 2013-10-28: 500 [IU]
  Filled 2013-10-28: qty 5

## 2013-10-28 MED ORDER — SODIUM CHLORIDE 0.9 % IJ SOLN
10.0000 mL | INTRAMUSCULAR | Status: DC | PRN
  Administered 2013-10-28: 10 mL
  Filled 2013-10-28: qty 10

## 2013-10-28 MED ORDER — DEXAMETHASONE SODIUM PHOSPHATE 10 MG/ML IJ SOLN
INTRAMUSCULAR | Status: AC
  Filled 2013-10-28: qty 1

## 2013-10-28 MED ORDER — ONDANSETRON 8 MG/NS 50 ML IVPB
INTRAVENOUS | Status: AC
  Filled 2013-10-28: qty 8

## 2013-10-28 MED ORDER — DEXAMETHASONE SODIUM PHOSPHATE 10 MG/ML IJ SOLN
10.0000 mg | Freq: Once | INTRAMUSCULAR | Status: AC
  Administered 2013-10-28: 10 mg via INTRAVENOUS

## 2013-10-28 MED ORDER — SODIUM CHLORIDE 0.9 % IV SOLN
90.0000 mg/m2 | Freq: Once | INTRAVENOUS | Status: AC
  Administered 2013-10-28: 195 mg via INTRAVENOUS
  Filled 2013-10-28: qty 39

## 2013-10-28 MED ORDER — DIPHENHYDRAMINE HCL 25 MG PO CAPS
50.0000 mg | ORAL_CAPSULE | Freq: Once | ORAL | Status: AC
  Administered 2013-10-28: 50 mg via ORAL

## 2013-10-28 MED ORDER — ONDANSETRON 8 MG/50ML IVPB (CHCC)
8.0000 mg | Freq: Once | INTRAVENOUS | Status: AC
  Administered 2013-10-28: 8 mg via INTRAVENOUS

## 2013-10-28 MED ORDER — ACETAMINOPHEN 325 MG PO TABS
650.0000 mg | ORAL_TABLET | Freq: Once | ORAL | Status: AC
  Administered 2013-10-28: 650 mg via ORAL

## 2013-10-28 MED ORDER — SODIUM CHLORIDE 0.9 % IV SOLN
Freq: Once | INTRAVENOUS | Status: AC
  Administered 2013-10-28: 10:00:00 via INTRAVENOUS

## 2013-10-28 NOTE — Telephone Encounter (Signed)
appts made per 1/6 POF Email to MW to add tx AVS and CAL given shh

## 2013-10-28 NOTE — Telephone Encounter (Signed)
Per staff message and POF I have scheduled appts.  JMW  

## 2013-10-28 NOTE — Patient Instructions (Signed)
[facility] Discharge Instructions for Patients Receiving Chemotherapy  Today you received the following chemotherapy agents Rituxan/Treanda To help prevent nausea and vomiting after your treatment, we encourage you to take your nausea medication as prescribed.  If you develop nausea and vomiting that is not controlled by your nausea medication, call the clinic.   BELOW ARE SYMPTOMS THAT SHOULD BE REPORTED IMMEDIATELY:  *FEVER GREATER THAN 100.5 F  *CHILLS WITH OR WITHOUT FEVER  NAUSEA AND VOMITING THAT IS NOT CONTROLLED WITH YOUR NAUSEA MEDICATION  *UNUSUAL SHORTNESS OF BREATH  *UNUSUAL BRUISING OR BLEEDING  TENDERNESS IN MOUTH AND THROAT WITH OR WITHOUT PRESENCE OF ULCERS  *URINARY PROBLEMS  *BOWEL PROBLEMS  UNUSUAL RASH Items with * indicate a potential emergency and should be followed up as soon as possible.  Feel free to call the clinic you have any questions or concerns. The clinic phone number is [phone].

## 2013-10-28 NOTE — Progress Notes (Signed)
      OFFICE PROGRESS NOTE   INTERVAL HISTORY:   Returns for scheduled followup of lymphoma. She reports resolution of the abdominal pain since starting Protonix. She feels well. Good appetite. No fever. She has hot flashes. Mild intermittent cough.  Objective:  Vital signs in last 24 hours:  Blood pressure 124/69, pulse 79, temperature 96.8 F (36 C), temperature source Oral, resp. rate 18, height 5\' 5"  (1.651 m), weight 238 lb 4.8 oz (108.092 kg).    HEENT: No thrush or ulcers Lymphatics: No cervical or supraclavicular nodes Resp: End inspiratory rhonchi at the lower posterior chest bilaterally, no respiratory distress Cardio: Regular rate and rhythm GI: No hepatosplenomegaly, nontender, no mass Vascular: No leg edema  Portacath/PICC-without erythema  Lab Results:  Lab Results  Component Value Date   WBC 5.4 10/28/2013   HGB 13.7 10/28/2013   HCT 41.1 10/28/2013   MCV 90.1 10/28/2013   PLT 228 10/28/2013   NEUTROABS 2.0 10/28/2013      Medications: I have reviewed the patient's current medications.  Assessment/Plan: 1. Low-grade follicular lymphoma involving a right parotid mass, status post an excisional biopsy on 11/28/2010. a. Staging CT scans 01/03/2011 confirmed an increased number of small nodes in the neck, left axilla and pelvis without clear evidence of pathologic lymphadenopathy. b. PET scan 01/11/2011 confirmed hypermetabolic lymph nodes in the right cervical chain, left axillary nodes, periaortic, common iliac, external iliac and inguinal nodes. There was also a possible area of involvement at the right tonsillar region. c. Palpable left posterior cervical nodes confirmed on exam 05/15/2013- progressive left neck nodes on exam 08/18/2013. d. Status post cycle 1 bendamustine/Rituxan beginning 08/28/2013. e. Near-complete resolution of left neck adenopathy on exam 09/12/2013. f. Status post cycle 2 bendamustine/Rituxan beginning 09/25/2013. g. CT abdomen/pelvis  10/01/2013-near-complete response to therapy with isolated borderline enlarged left iliac node measuring 1.37 m. Previously identified right peritoneal right pelvic sidewall adenopathy is resolved. 2. Stage I right-sided breast cancer diagnosed in 1998. 3. History of congestive heart failure. 4. Hypertension. 5. Port-A-Cath placement 08/25/2013 in interventional radiology. 6. Chills during the Rituxan infusion 08/28/2013. She was given Solu-Medrol. Rituxan was resumed and completed. 7. Abdominal pain following cycle 2 bendamustine/Rituxan-no explanation for the pain on a CT 10/01/2013, resolved after starting Protonix   Disposition:  Crystal [name] appears well today. The abdominal pain she experienced after cycle 2 bendamustine/rituximab has resolved. The plan is to proceed with cycle 3 chemotherapy today. She will return for an office visit and cycle 4 in one month.   [name], MD  10/28/2013  3:27 PM

## 2013-10-29 ENCOUNTER — Ambulatory Visit (HOSPITAL_BASED_OUTPATIENT_CLINIC_OR_DEPARTMENT_OTHER): Payer: Medicare Other

## 2013-10-29 VITALS — BP 111/75 | HR 95 | Temp 98.0°F | Resp 18

## 2013-10-29 DIAGNOSIS — C8589 Other specified types of non-Hodgkin lymphoma, extranodal and solid organ sites: Secondary | ICD-10-CM

## 2013-10-29 DIAGNOSIS — Z5112 Encounter for antineoplastic immunotherapy: Secondary | ICD-10-CM | POA: Diagnosis not present

## 2013-10-29 DIAGNOSIS — C8291 Follicular lymphoma, unspecified, lymph nodes of head, face, and neck: Secondary | ICD-10-CM | POA: Diagnosis not present

## 2013-10-29 MED ORDER — DEXAMETHASONE SODIUM PHOSPHATE 10 MG/ML IJ SOLN
INTRAMUSCULAR | Status: AC
  Filled 2013-10-29: qty 1

## 2013-10-29 MED ORDER — SODIUM CHLORIDE 0.9 % IV SOLN
90.0000 mg/m2 | Freq: Once | INTRAVENOUS | Status: DC
  Filled 2013-10-29: qty 39

## 2013-10-29 MED ORDER — ONDANSETRON 8 MG/NS 50 ML IVPB
INTRAVENOUS | Status: AC
  Filled 2013-10-29: qty 8

## 2013-10-29 MED ORDER — HEPARIN SOD (PORK) LOCK FLUSH 100 UNIT/ML IV SOLN
500.0000 [IU] | Freq: Once | INTRAVENOUS | Status: AC | PRN
  Administered 2013-10-29: 500 [IU]
  Filled 2013-10-29: qty 5

## 2013-10-29 MED ORDER — SODIUM CHLORIDE 0.9 % IJ SOLN
10.0000 mL | INTRAMUSCULAR | Status: DC | PRN
  Administered 2013-10-29: 10 mL
  Filled 2013-10-29: qty 10

## 2013-10-29 MED ORDER — DEXAMETHASONE SODIUM PHOSPHATE 10 MG/ML IJ SOLN
10.0000 mg | Freq: Once | INTRAMUSCULAR | Status: AC
  Administered 2013-10-29: 10 mg via INTRAVENOUS

## 2013-10-29 MED ORDER — ONDANSETRON 8 MG/50ML IVPB (CHCC)
8.0000 mg | Freq: Once | INTRAVENOUS | Status: AC
  Administered 2013-10-29: 8 mg via INTRAVENOUS

## 2013-10-29 MED ORDER — SODIUM CHLORIDE 0.9 % IV SOLN
90.0000 mg/m2 | Freq: Once | INTRAVENOUS | Status: AC
  Administered 2013-10-29: 198 mg via INTRAVENOUS
  Filled 2013-10-29: qty 2.2

## 2013-10-29 MED ORDER — SODIUM CHLORIDE 0.9 % IV SOLN
Freq: Once | INTRAVENOUS | Status: AC
  Administered 2013-10-29: 10:00:00 via INTRAVENOUS

## 2013-10-29 NOTE — Patient Instructions (Signed)
[facility] Discharge Instructions for Patients Receiving Chemotherapy  Today you received the following chemotherapy agent Treanda.  To help prevent nausea and vomiting after your treatment, we encourage you to take your nausea medication.   If you develop nausea and vomiting that is not controlled by your nausea medication, call the clinic.   BELOW ARE SYMPTOMS THAT SHOULD BE REPORTED IMMEDIATELY:  *FEVER GREATER THAN 100.5 F  *CHILLS WITH OR WITHOUT FEVER  NAUSEA AND VOMITING THAT IS NOT CONTROLLED WITH YOUR NAUSEA MEDICATION  *UNUSUAL SHORTNESS OF BREATH  *UNUSUAL BRUISING OR BLEEDING  TENDERNESS IN MOUTH AND THROAT WITH OR WITHOUT PRESENCE OF ULCERS  *URINARY PROBLEMS  *BOWEL PROBLEMS  UNUSUAL RASH Items with * indicate a potential emergency and should be followed up as soon as possible.  Feel free to call the clinic you have any questions or concerns. The clinic phone number is [phone].

## 2013-11-23 ENCOUNTER — Other Ambulatory Visit: Payer: Self-pay | Admitting: Oncology

## 2013-11-25 ENCOUNTER — Ambulatory Visit (HOSPITAL_BASED_OUTPATIENT_CLINIC_OR_DEPARTMENT_OTHER): Payer: Medicare Other

## 2013-11-25 ENCOUNTER — Ambulatory Visit (HOSPITAL_BASED_OUTPATIENT_CLINIC_OR_DEPARTMENT_OTHER): Payer: Medicare Other | Admitting: Nurse Practitioner

## 2013-11-25 ENCOUNTER — Encounter: Payer: Self-pay | Admitting: Specialist

## 2013-11-25 ENCOUNTER — Other Ambulatory Visit (HOSPITAL_BASED_OUTPATIENT_CLINIC_OR_DEPARTMENT_OTHER): Payer: Medicare Other

## 2013-11-25 ENCOUNTER — Telehealth: Payer: Self-pay | Admitting: Oncology

## 2013-11-25 VITALS — BP 138/89 | HR 83 | Temp 97.6°F | Resp 18 | Ht 65.0 in | Wt 237.1 lb

## 2013-11-25 VITALS — BP 102/68 | HR 93 | Temp 98.4°F | Resp 18

## 2013-11-25 DIAGNOSIS — I509 Heart failure, unspecified: Secondary | ICD-10-CM

## 2013-11-25 DIAGNOSIS — Z5111 Encounter for antineoplastic chemotherapy: Secondary | ICD-10-CM

## 2013-11-25 DIAGNOSIS — C8589 Other specified types of non-Hodgkin lymphoma, extranodal and solid organ sites: Secondary | ICD-10-CM

## 2013-11-25 DIAGNOSIS — Z5112 Encounter for antineoplastic immunotherapy: Secondary | ICD-10-CM

## 2013-11-25 DIAGNOSIS — Z853 Personal history of malignant neoplasm of breast: Secondary | ICD-10-CM

## 2013-11-25 DIAGNOSIS — C8298 Follicular lymphoma, unspecified, lymph nodes of multiple sites: Secondary | ICD-10-CM

## 2013-11-25 DIAGNOSIS — I10 Essential (primary) hypertension: Secondary | ICD-10-CM

## 2013-11-25 LAB — COMPREHENSIVE METABOLIC PANEL (CC13)
ALK PHOS: 118 U/L (ref 40–150)
ALT: 38 U/L (ref 0–55)
ANION GAP: 8 meq/L (ref 3–11)
AST: 31 U/L (ref 5–34)
Albumin: 3.2 g/dL — ABNORMAL LOW (ref 3.5–5.0)
BILIRUBIN TOTAL: 0.3 mg/dL (ref 0.20–1.20)
BUN: 18.5 mg/dL (ref 7.0–26.0)
CO2: 28 meq/L (ref 22–29)
Calcium: 9.1 mg/dL (ref 8.4–10.4)
Chloride: 108 mEq/L (ref 98–109)
Creatinine: 1 mg/dL (ref 0.6–1.1)
Glucose: 99 mg/dl (ref 70–140)
Potassium: 3.6 mEq/L (ref 3.5–5.1)
Sodium: 144 mEq/L (ref 136–145)
Total Protein: 6.3 g/dL — ABNORMAL LOW (ref 6.4–8.3)

## 2013-11-25 LAB — CBC WITH DIFFERENTIAL/PLATELET
BASO%: 1.5 % (ref 0.0–2.0)
Basophils Absolute: 0 10*3/uL (ref 0.0–0.1)
EOS%: 3.3 % (ref 0.0–7.0)
Eosinophils Absolute: 0.1 10*3/uL (ref 0.0–0.5)
HCT: 41.3 % (ref 34.8–46.6)
HGB: 13.7 g/dL (ref 11.6–15.9)
LYMPH%: 20.1 % (ref 14.0–49.7)
MCH: 29.7 pg (ref 25.1–34.0)
MCHC: 33.2 g/dL (ref 31.5–36.0)
MCV: 89.6 fL (ref 79.5–101.0)
MONO#: 0.5 10*3/uL (ref 0.1–0.9)
MONO%: 16.7 % — AB (ref 0.0–14.0)
NEUT#: 1.6 10*3/uL (ref 1.5–6.5)
NEUT%: 58.4 % (ref 38.4–76.8)
NRBC: 0 % (ref 0–0)
PLATELETS: 158 10*3/uL (ref 145–400)
RBC: 4.61 10*6/uL (ref 3.70–5.45)
RDW: 15.4 % — AB (ref 11.2–14.5)
WBC: 2.7 10*3/uL — ABNORMAL LOW (ref 3.9–10.3)
lymph#: 0.5 10*3/uL — ABNORMAL LOW (ref 0.9–3.3)

## 2013-11-25 MED ORDER — ACETAMINOPHEN 325 MG PO TABS
650.0000 mg | ORAL_TABLET | Freq: Once | ORAL | Status: AC
  Administered 2013-11-25: 650 mg via ORAL

## 2013-11-25 MED ORDER — DIPHENHYDRAMINE HCL 25 MG PO CAPS
ORAL_CAPSULE | ORAL | Status: AC
Start: 2013-11-25 — End: 2013-11-25
  Filled 2013-11-25: qty 2

## 2013-11-25 MED ORDER — LORAZEPAM 0.5 MG PO TABS: ORAL_TABLET | ORAL | Status: DC

## 2013-11-25 MED ORDER — ACETAMINOPHEN 325 MG PO TABS
ORAL_TABLET | ORAL | Status: AC
Start: 2013-11-25 — End: 2013-11-25
  Filled 2013-11-25: qty 2

## 2013-11-25 MED ORDER — ONDANSETRON 8 MG/NS 50 ML IVPB
INTRAVENOUS | Status: AC
Start: 2013-11-25 — End: 2013-11-25
  Filled 2013-11-25: qty 8

## 2013-11-25 MED ORDER — DEXAMETHASONE SODIUM PHOSPHATE 10 MG/ML IJ SOLN
INTRAMUSCULAR | Status: AC
Start: 2013-11-25 — End: 2013-11-25
  Filled 2013-11-25: qty 1

## 2013-11-25 MED ORDER — DEXAMETHASONE SODIUM PHOSPHATE 10 MG/ML IJ SOLN
10.0000 mg | Freq: Once | INTRAMUSCULAR | Status: AC
  Administered 2013-11-25: 10 mg via INTRAVENOUS

## 2013-11-25 MED ORDER — SODIUM CHLORIDE 0.9 % IV SOLN
Freq: Once | INTRAVENOUS | Status: AC
  Administered 2013-11-25: 10:00:00 via INTRAVENOUS

## 2013-11-25 MED ORDER — SODIUM CHLORIDE 0.9 % IV SOLN
375.0000 mg/m2 | Freq: Once | INTRAVENOUS | Status: AC
  Administered 2013-11-25: 800 mg via INTRAVENOUS
  Filled 2013-11-25: qty 80

## 2013-11-25 MED ORDER — SODIUM CHLORIDE 0.9 % IV SOLN
90.0000 mg/m2 | Freq: Once | INTRAVENOUS | Status: AC
  Administered 2013-11-25: 198 mg via INTRAVENOUS
  Filled 2013-11-25: qty 2.2

## 2013-11-25 MED ORDER — ONDANSETRON 8 MG/50ML IVPB (CHCC)
8.0000 mg | Freq: Once | INTRAVENOUS | Status: AC
  Administered 2013-11-25: 8 mg via INTRAVENOUS

## 2013-11-25 MED ORDER — DIPHENHYDRAMINE HCL 25 MG PO CAPS
50.0000 mg | ORAL_CAPSULE | Freq: Once | ORAL | Status: AC
  Administered 2013-11-25: 50 mg via ORAL

## 2013-11-25 MED ORDER — SODIUM CHLORIDE 0.9 % IJ SOLN
10.0000 mL | INTRAMUSCULAR | Status: DC | PRN
  Administered 2013-11-25: 10 mL
  Filled 2013-11-25: qty 10

## 2013-11-25 MED ORDER — HEPARIN SOD (PORK) LOCK FLUSH 100 UNIT/ML IV SOLN
500.0000 [IU] | Freq: Once | INTRAVENOUS | Status: AC | PRN
  Administered 2013-11-25: 500 [IU]
  Filled 2013-11-25: qty 5

## 2013-11-25 NOTE — Telephone Encounter (Signed)
gv an printed appt sched and avs forpt for Feb and March...sed added tx.

## 2013-11-25 NOTE — Patient Instructions (Signed)
[facility] Discharge Instructions for Patients Receiving Chemotherapy  Today you received the following chemotherapy agents RITUXAN and TREANDA  To help prevent nausea and vomiting after your treatment, we encourage you to take your nausea medication as prescribed.   If you develop nausea and vomiting that is not controlled by your nausea medication, call the clinic.   BELOW ARE SYMPTOMS THAT SHOULD BE REPORTED IMMEDIATELY:  *FEVER GREATER THAN 100.5 F  *CHILLS WITH OR WITHOUT FEVER  NAUSEA AND VOMITING THAT IS NOT CONTROLLED WITH YOUR NAUSEA MEDICATION  *UNUSUAL SHORTNESS OF BREATH  *UNUSUAL BRUISING OR BLEEDING  TENDERNESS IN MOUTH AND THROAT WITH OR WITHOUT PRESENCE OF ULCERS  *URINARY PROBLEMS  *BOWEL PROBLEMS  UNUSUAL RASH Items with * indicate a potential emergency and should be followed up as soon as possible.  Feel free to call the clinic you have any questions or concerns. The clinic phone number is [phone].

## 2013-11-25 NOTE — Progress Notes (Signed)
Met patient in lobby.  Provided information on support programs available including chaplaincy. Patient talked about her medical history and its impact on her life. Crystal [name], [facility], PhD

## 2013-11-25 NOTE — Progress Notes (Signed)
OFFICE PROGRESS NOTE  Interval history:  Crystal [name] returns for followup of lymphoma. She completed cycle 3 bendamustine/Rituxan beginning 10/28/2013. She denies nausea/vomiting. No mouth sores. No diarrhea or constipation. No further abdominal pain. She denies any fevers or sweats. She continues to have a good appetite.   Objective: Filed Vitals:   11/25/13 0906  BP: 138/89  Pulse: 83  Temp: 97.6 F (36.4 C)  Resp: 18   Oropharynx is without thrush or ulceration. No palpable cervical or supraclavicular lymph nodes. Faint rales right lung base. Lungs otherwise clear. Regular cardiac rhythm. Port-A-Cath site without erythema. Abdomen soft, obese. No obvious organomegaly. No leg edema.   Lab Results: Lab Results  Component Value Date   WBC 2.7* 11/25/2013   HGB 13.7 11/25/2013   HCT 41.3 11/25/2013   MCV 89.6 11/25/2013   PLT 158 11/25/2013   NEUTROABS 1.6 11/25/2013    Chemistry:    Chemistry      Component Value Date/Time   NA 142 10/28/2013 0814   NA 142 05/30/2013 1523   K 4.0 10/28/2013 0814   K 4.6 05/30/2013 1523   CL 106 05/30/2013 1523   CO2 28 10/28/2013 0814   CO2 31 05/30/2013 1523   BUN 13.8 10/28/2013 0814   BUN 16 05/30/2013 1523   CREATININE 1.0 10/28/2013 0814   CREATININE 1.07 05/30/2013 1523   CREATININE 1.09 12/22/2010 1548      Component Value Date/Time   CALCIUM 8.9 10/28/2013 0814   CALCIUM 9.2 05/30/2013 1523   ALKPHOS 111 10/28/2013 0814   ALKPHOS 75 12/22/2010 1548   AST 25 10/28/2013 0814   AST 23 12/22/2010 1548   ALT 26 10/28/2013 0814   ALT 15 12/22/2010 1548   BILITOT 0.34 10/28/2013 0814   BILITOT 0.4 12/22/2010 1548       Studies/Results: No results found.  Medications: I have reviewed the patient's current medications.  Assessment/Plan: 1. Low-grade follicular lymphoma involving a right parotid mass, status post an excisional biopsy on 11/28/2010. a. Staging CT scans 01/03/2011 confirmed an increased number of small nodes in the neck, left axilla and pelvis without clear  evidence of pathologic lymphadenopathy. b. PET scan 01/11/2011 confirmed hypermetabolic lymph nodes in the right cervical chain, left axillary nodes, periaortic, common iliac, external iliac and inguinal nodes. There was also a possible area of involvement at the right tonsillar region. c. Palpable left posterior cervical nodes confirmed on exam 05/15/2013- progressive left neck nodes on exam 08/18/2013. d. Status post cycle 1 bendamustine/Rituxan beginning 08/28/2013. e. Near-complete resolution of left neck adenopathy on exam 09/12/2013. f. Status post cycle 2 bendamustine/Rituxan beginning 09/25/2013. g. CT abdomen/pelvis 10/01/2013-near-complete response to therapy with isolated borderline enlarged left iliac node measuring 1.37 m. Previously identified right peritoneal right pelvic sidewall adenopathy is resolved. h. Cycle 3 bendamustine/Rituxan beginning 10/28/2013. 2. Stage I right-sided breast cancer diagnosed in 1998. 3. History of congestive heart failure. 4. Hypertension. 5. Port-A-Cath placement 08/25/2013 in interventional radiology. 6. Chills during the Rituxan infusion 08/28/2013. She was given Solu-Medrol. Rituxan was resumed and completed. 7. Abdominal pain following cycle 2 bendamustine/Rituxan-no explanation for the pain on a CT 10/01/2013, resolved after starting Protonix.  Dispositon-she appears stable. She has completed 3 cycles of bendamustine/Rituxan. She is tolerating treatment well. Plan to proceed with cycle 4 today as scheduled. We discussed with her that her white count is mildly low. She understands to contact the office with any signs of infection.  She will return for a followup visit and cycle 5  of bendamustine/Rituxan in 4 weeks.  Plan reviewed with Dr. Benay Spice.   Ned Card ANP/GNP-BC

## 2013-11-26 ENCOUNTER — Other Ambulatory Visit: Payer: Self-pay | Admitting: Nurse Practitioner

## 2013-11-26 ENCOUNTER — Ambulatory Visit (HOSPITAL_BASED_OUTPATIENT_CLINIC_OR_DEPARTMENT_OTHER): Payer: Medicare Other

## 2013-11-26 VITALS — BP 104/65 | HR 88 | Temp 97.8°F | Resp 20

## 2013-11-26 DIAGNOSIS — Z5111 Encounter for antineoplastic chemotherapy: Secondary | ICD-10-CM | POA: Diagnosis not present

## 2013-11-26 DIAGNOSIS — C8298 Follicular lymphoma, unspecified, lymph nodes of multiple sites: Secondary | ICD-10-CM | POA: Diagnosis not present

## 2013-11-26 DIAGNOSIS — C8589 Other specified types of non-Hodgkin lymphoma, extranodal and solid organ sites: Secondary | ICD-10-CM

## 2013-11-26 MED ORDER — SODIUM CHLORIDE 0.9 % IJ SOLN
10.0000 mL | INTRAMUSCULAR | Status: DC | PRN
  Administered 2013-11-26: 10 mL
  Filled 2013-11-26: qty 10

## 2013-11-26 MED ORDER — HEPARIN SOD (PORK) LOCK FLUSH 100 UNIT/ML IV SOLN
500.0000 [IU] | Freq: Once | INTRAVENOUS | Status: AC | PRN
  Administered 2013-11-26: 500 [IU]
  Filled 2013-11-26: qty 5

## 2013-11-26 MED ORDER — DEXAMETHASONE SODIUM PHOSPHATE 10 MG/ML IJ SOLN
INTRAMUSCULAR | Status: AC
  Filled 2013-11-26: qty 1

## 2013-11-26 MED ORDER — SODIUM CHLORIDE 0.9 % IV SOLN: 90.0000 mg/m2 | Freq: Once | INTRAVENOUS | Status: DC

## 2013-11-26 MED ORDER — DEXAMETHASONE SODIUM PHOSPHATE 10 MG/ML IJ SOLN
10.0000 mg | Freq: Once | INTRAMUSCULAR | Status: AC
  Administered 2013-11-26: 10 mg via INTRAVENOUS

## 2013-11-26 MED ORDER — SODIUM CHLORIDE 0.9 % IV SOLN
90.0000 mg/m2 | Freq: Once | INTRAVENOUS | Status: AC
  Administered 2013-11-26: 198 mg via INTRAVENOUS
  Filled 2013-11-26: qty 2.2

## 2013-11-26 MED ORDER — ONDANSETRON 8 MG/50ML IVPB (CHCC)
8.0000 mg | Freq: Once | INTRAVENOUS | Status: AC
  Administered 2013-11-26: 8 mg via INTRAVENOUS

## 2013-11-26 MED ORDER — ONDANSETRON 8 MG/NS 50 ML IVPB
INTRAVENOUS | Status: AC
  Filled 2013-11-26: qty 8

## 2013-11-26 MED ORDER — SODIUM CHLORIDE 0.9 % IV SOLN
Freq: Once | INTRAVENOUS | Status: AC
  Administered 2013-11-26: 13:00:00 via INTRAVENOUS

## 2013-11-26 NOTE — Patient Instructions (Signed)
[facility] Discharge Instructions for Patients Receiving Chemotherapy  Today you received the following chemotherapy agents Treanda.  To help prevent nausea and vomiting after your treatment, we encourage you to take your nausea medication.   If you develop nausea and vomiting that is not controlled by your nausea medication, call the clinic.   BELOW ARE SYMPTOMS THAT SHOULD BE REPORTED IMMEDIATELY:  *FEVER GREATER THAN 100.5 F  *CHILLS WITH OR WITHOUT FEVER  NAUSEA AND VOMITING THAT IS NOT CONTROLLED WITH YOUR NAUSEA MEDICATION  *UNUSUAL SHORTNESS OF BREATH  *UNUSUAL BRUISING OR BLEEDING  TENDERNESS IN MOUTH AND THROAT WITH OR WITHOUT PRESENCE OF ULCERS  *URINARY PROBLEMS  *BOWEL PROBLEMS  UNUSUAL RASH Items with * indicate a potential emergency and should be followed up as soon as possible.  Feel free to call the clinic you have any questions or concerns. The clinic phone number is [phone].

## 2013-11-28 ENCOUNTER — Encounter: Payer: Self-pay | Admitting: Neurology

## 2013-12-02 ENCOUNTER — Ambulatory Visit: Payer: Medicare Other | Admitting: Neurology

## 2013-12-23 ENCOUNTER — Other Ambulatory Visit: Payer: Self-pay | Admitting: *Deleted

## 2013-12-23 ENCOUNTER — Other Ambulatory Visit: Payer: Self-pay | Admitting: Oncology

## 2013-12-23 ENCOUNTER — Ambulatory Visit (HOSPITAL_BASED_OUTPATIENT_CLINIC_OR_DEPARTMENT_OTHER): Payer: Medicare Other

## 2013-12-23 ENCOUNTER — Ambulatory Visit (HOSPITAL_COMMUNITY)
Admission: RE | Admit: 2013-12-23 | Discharge: 2013-12-23 | Disposition: A | Discharge status: Home / Self Care | Payer: Medicare Other | Source: Ambulatory Visit | Attending: Oncology | Admitting: Oncology

## 2013-12-23 ENCOUNTER — Other Ambulatory Visit (HOSPITAL_BASED_OUTPATIENT_CLINIC_OR_DEPARTMENT_OTHER): Payer: Medicare Other

## 2013-12-23 ENCOUNTER — Encounter (INDEPENDENT_AMBULATORY_CARE_PROVIDER_SITE_OTHER): Payer: Self-pay

## 2013-12-23 ENCOUNTER — Encounter (HOSPITAL_COMMUNITY): Payer: Self-pay

## 2013-12-23 ENCOUNTER — Telehealth: Payer: Self-pay | Admitting: Oncology

## 2013-12-23 ENCOUNTER — Other Ambulatory Visit: Payer: Self-pay

## 2013-12-23 ENCOUNTER — Ambulatory Visit (HOSPITAL_BASED_OUTPATIENT_CLINIC_OR_DEPARTMENT_OTHER): Payer: Medicare Other | Admitting: Oncology

## 2013-12-23 ENCOUNTER — Telehealth: Payer: Self-pay | Admitting: Cardiology

## 2013-12-23 VITALS — BP 128/82 | HR 120 | Resp 20

## 2013-12-23 VITALS — BP 130/84 | HR 100 | Temp 97.3°F | Resp 19 | Ht 65.0 in | Wt 237.9 lb

## 2013-12-23 DIAGNOSIS — C8588 Other specified types of non-Hodgkin lymphoma, lymph nodes of multiple sites: Secondary | ICD-10-CM

## 2013-12-23 DIAGNOSIS — R000 Tachycardia, unspecified: Secondary | ICD-10-CM

## 2013-12-23 DIAGNOSIS — J984 Other disorders of lung: Secondary | ICD-10-CM | POA: Diagnosis not present

## 2013-12-23 DIAGNOSIS — Z853 Personal history of malignant neoplasm of breast: Secondary | ICD-10-CM | POA: Insufficient documentation

## 2013-12-23 DIAGNOSIS — I2699 Other pulmonary embolism without acute cor pulmonale: Secondary | ICD-10-CM | POA: Insufficient documentation

## 2013-12-23 DIAGNOSIS — C8589 Other specified types of non-Hodgkin lymphoma, extranodal and solid organ sites: Secondary | ICD-10-CM | POA: Diagnosis not present

## 2013-12-23 DIAGNOSIS — M47814 Spondylosis without myelopathy or radiculopathy, thoracic region: Secondary | ICD-10-CM | POA: Diagnosis not present

## 2013-12-23 DIAGNOSIS — J9819 Other pulmonary collapse: Secondary | ICD-10-CM | POA: Insufficient documentation

## 2013-12-23 DIAGNOSIS — I509 Heart failure, unspecified: Secondary | ICD-10-CM

## 2013-12-23 LAB — CBC WITH DIFFERENTIAL/PLATELET
BASO%: 1.5 % (ref 0.0–2.0)
Basophils Absolute: 0.1 10*3/uL (ref 0.0–0.1)
EOS ABS: 0.1 10*3/uL (ref 0.0–0.5)
EOS%: 2.3 % (ref 0.0–7.0)
HEMATOCRIT: 41.7 % (ref 34.8–46.6)
HGB: 13.9 g/dL (ref 11.6–15.9)
LYMPH%: 41.5 % (ref 14.0–49.7)
MCH: 30 pg (ref 25.1–34.0)
MCHC: 33.3 g/dL (ref 31.5–36.0)
MCV: 90.1 fL (ref 79.5–101.0)
MONO#: 0.5 10*3/uL (ref 0.1–0.9)
MONO%: 14.6 % — AB (ref 0.0–14.0)
NEUT%: 40.1 % (ref 38.4–76.8)
NEUTROS ABS: 1.4 10*3/uL — AB (ref 1.5–6.5)
Platelets: 178 10*3/uL (ref 145–400)
RBC: 4.63 10*6/uL (ref 3.70–5.45)
RDW: 15.3 % — ABNORMAL HIGH (ref 11.2–14.5)
WBC: 3.4 10*3/uL — AB (ref 3.9–10.3)
lymph#: 1.4 10*3/uL (ref 0.9–3.3)

## 2013-12-23 LAB — COMPREHENSIVE METABOLIC PANEL (CC13)
ALK PHOS: 136 U/L (ref 40–150)
ALT: 33 U/L (ref 0–55)
ANION GAP: 10 meq/L (ref 3–11)
AST: 30 U/L (ref 5–34)
Albumin: 3.3 g/dL — ABNORMAL LOW (ref 3.5–5.0)
BILIRUBIN TOTAL: 0.37 mg/dL (ref 0.20–1.20)
BUN: 18.3 mg/dL (ref 7.0–26.0)
CO2: 28 mEq/L (ref 22–29)
Calcium: 9.5 mg/dL (ref 8.4–10.4)
Chloride: 108 mEq/L (ref 98–109)
Creatinine: 1 mg/dL (ref 0.6–1.1)
Glucose: 103 mg/dl (ref 70–140)
Potassium: 3.3 mEq/L — ABNORMAL LOW (ref 3.5–5.1)
SODIUM: 146 meq/L — AB (ref 136–145)
Total Protein: 6.8 g/dL (ref 6.4–8.3)

## 2013-12-23 MED ORDER — SODIUM CHLORIDE 0.9 % IV SOLN
Freq: Once | INTRAVENOUS | Status: AC
  Administered 2013-12-23: 11:00:00 via INTRAVENOUS

## 2013-12-23 MED ORDER — SODIUM CHLORIDE 0.9 % IJ SOLN
10.0000 mL | Freq: Once | INTRAMUSCULAR | Status: AC
  Administered 2013-12-23: 10 mL via INTRAVENOUS
  Filled 2013-12-23: qty 10

## 2013-12-23 MED ORDER — RIVAROXABAN (XARELTO) VTE STARTER PACK (15 & 20 MG): ORAL_TABLET | ORAL | Status: DC

## 2013-12-23 MED ORDER — ENOXAPARIN SODIUM 100 MG/ML ~~LOC~~ SOLN
100.0000 mg | Freq: Once | SUBCUTANEOUS | Status: AC
  Administered 2013-12-23: 100 mg via SUBCUTANEOUS
  Filled 2013-12-23: qty 1

## 2013-12-23 MED ORDER — METOPROLOL TARTRATE 50 MG PO TABS
50.0000 mg | ORAL_TABLET | Freq: Once | ORAL | Status: AC
  Administered 2013-12-23: 50 mg via ORAL
  Filled 2013-12-23: qty 1

## 2013-12-23 MED ORDER — HEPARIN SOD (PORK) LOCK FLUSH 100 UNIT/ML IV SOLN
500.0000 [IU] | Freq: Once | INTRAVENOUS | Status: AC
  Administered 2013-12-23: 500 [IU] via INTRAVENOUS
  Filled 2013-12-23: qty 5

## 2013-12-23 MED ORDER — IOHEXOL 350 MG/ML SOLN
100.0000 mL | Freq: Once | INTRAVENOUS | Status: AC | PRN
  Administered 2013-12-23: 100 mL via INTRAVENOUS

## 2013-12-23 NOTE — Progress Notes (Signed)
Dr. Benay Spice discussed case with Dr. Ellyn Hack, cardiologist. Orders received. 1300: Metoprolol given. Pt worked in for [place]. 1320: Heart rate remains at 120. Pt reports feeling of "fluttering" has resolved. Sent to radiology accompanied by family member.

## 2013-12-23 NOTE — Patient Instructions (Signed)
Enoxaparin injection What is this medicine? ENOXAPARIN (ee nox a PA rin) is used after knee, hip, or abdominal surgeries to prevent blood clotting. It is also used to treat existing blood clots in the lungs or in the veins. This medicine may be used for other purposes; ask your health care provider or pharmacist if you have questions. COMMON BRAND NAME(S): Lovenox What should I tell my health care provider before I take this medicine? They need to know if you have any of these conditions: -bleeding disorders, hemorrhage, or hemophilia -infection of the heart or heart valves -kidney or liver disease -previous stroke -prosthetic heart valve -recent surgery or delivery of a baby -ulcer in the stomach or intestine, diverticulitis, or other bowel disease -an unusual or allergic reaction to enoxaparin, heparin, pork or pork products, other medicines, foods, dyes, or preservatives -pregnant or trying to get pregnant -breast-feeding How should I use this medicine? This medicine is for injection under the skin. It is usually given by a health-care professional. You or a family member may be trained on how to give the injections. If you are to give yourself injections, make sure you understand how to use the syringe, measure the dose if necessary, and give the injection. To avoid bruising, do not rub the site where this medicine has been injected. Do not take your medicine more often than directed. Do not stop taking except on the advice of your doctor or health care professional. Make sure you receive a puncture-resistant container to dispose of the needles and syringes once you have finished with them. Do not reuse these items. Return the container to your doctor or health care professional for proper disposal. Talk to your pediatrician regarding the use of this medicine in children. Special care may be needed. Overdosage: If you think you have taken too much of this medicine contact a poison control  center or emergency room at once. NOTE: This medicine is only for you. Do not share this medicine with others. What if I miss a dose? If you miss a dose, take it as soon as you can. If it is almost time for your next dose, take only that dose. Do not take double or extra doses. What may interact with this medicine? Do not take this medicine with any of the following medications: -aspirin and aspirin-like medicines -heparin -mifepristone -palifermin -warfarin  This medicine may also interact with the following medications: -cilostazol -clopidogrel -dipyridamole -NSAIDs, medicines for pain and inflammation, like ibuprofen or naproxen -sulfinpyrazone -ticlopidine This list may not describe all possible interactions. Give your health care provider a list of all the medicines, herbs, non-prescription drugs, or dietary supplements you use. Also tell them if you smoke, drink alcohol, or use illegal drugs. Some items may interact with your medicine. What should I watch for while using this medicine? Visit your doctor or health care professional for regular checks on your progress. Your condition will be monitored carefully while you are receiving this medicine. Notify your doctor or health care professional and seek emergency treatment if you develop breathing problems; changes in vision; chest pain; severe, sudden headache; pain, swelling, warmth in the leg; trouble speaking; sudden numbness or weakness of the face, arm, or leg. These can be signs that your condition has gotten worse. If you are going to have surgery, tell your doctor or health care professional that you are taking this medicine. Do not stop taking this medicine without first talking to your doctor. Be sure to refill your prescription  before you run out of medicine. Avoid sports and activities that might cause injury while you are using this medicine. Severe falls or injuries can cause unseen bleeding. Be careful when using sharp  tools or knives. Consider using an Copy. Take special care brushing or flossing your teeth. Report any injuries, bruising, or red spots on the skin to your doctor or health care professional. What side effects may I notice from receiving this medicine? Side effects that you should report to your doctor or health care professional as soon as possible: -allergic reactions like skin rash, itching or hives, swelling of the face, lips, or tongue -feeling faint or lightheaded, falls -signs and symptoms of bleeding such as bloody or black, tarry stools; red or dark-brown urine; spitting up blood or brown material that looks like coffee grounds; red spots on the skin; unusual bruising or bleeding from the eye, gums, or nose  Side effects that usually do not require medical attention (report to your doctor or health care professional if they continue or are bothersome): -pain, redness, or irritation at site where injected This list may not describe all possible side effects. Call your doctor for medical advice about side effects. You may report side effects to FDA at 1-800-FDA-1088. Where should I keep my medicine? Keep out of the reach of children. Store at room temperature between 15 and 30 degrees C (59 and 86 degrees F). Do not freeze. If your injections have been specially prepared, you may need to store them in the refrigerator. Ask your pharmacist. Throw away any unused medicine after the expiration date. NOTE: This sheet is a summary. It may not cover all possible information. If you have questions about this medicine, talk to your doctor, pharmacist, or health care provider.  2014, Elsevier/Gold Standard. (2013-02-04 16:13:24) Pulmonary Embolus A pulmonary (lung) embolus (PE) is a blood clot that has traveled from another place in the body to the lung. Most clots come from deep veins in the legs or pelvis. PE is a dangerous and potentially life-threatening condition that can be treated if  identified. CAUSES Blood clots form in a vein for different reasons. Usually several things cause blood clots. They include:  The flow of blood slows down.  The inside of the vein is damaged in some way.  The person has a condition that makes the blood clot more easily. These conditions may include:  Older age (especially over 29 years old).  Having a history of blood clots.  Having major or lengthy surgery. Hip surgery is particularly high-risk.  Breaking a hip or leg.  Sitting or lying still for a long time.  Cancer or cancer treatment.  Having a long, thin tube (catheter) placed inside a vein during a medical procedure.  Being overweight (obese).  Pregnancy and childbirth.  Medicines with estrogen.  Smoking.  Other circulation or heart problems. SYMPTOMS  The symptoms of a PE usually start suddenly and include:  Shortness of breath.  Coughing.  Coughing up blood or blood-tinged mucus (phlegm).  Chest pain. Pain is often worse with deep breaths.  Rapid heartbeat. DIAGNOSIS  If a PE is suspected, your caregiver will take a medical history and carry out a physical exam. Your caregiver will check for the risk factors listed above. Tests that also may be required include:  Blood tests, including studies of the clotting properties of your blood.  Imaging tests. Ultrasound, CT, MRI, and other tests can all be used to see if you have clots in  your legs or lungs. If you have a clot in your legs and have breathing or chest problems, your caregiver may conclude that you have a clot in your lungs. Further lung tests may not be needed.  Electrocardiography can look for heart strain from blood clots in the lungs. PREVENTION   Exercise the legs regularly. Take a brisk 30 minute walk every day.  Maintain a weight that is appropriate for your height.  Avoid sitting or lying in bed for long periods of time without moving your legs.  Women, particularly those over the age  of 67, should consider the risks and benefits of taking estrogen medicines, including birth control pills.  Do not smoke, especially if you take estrogen medicines.  Long-distance travel can increase your risk. You should exercise your legs by walking or pumping the muscles every hour.  In hospital prevention:  Your caregiver will assess your need for preventive PE care (prophylaxis) when you are admitted to the hospital. If you are having surgery, your surgeon will assess you the day of or day after surgery.  Prevention may include medical and nonmedical measures. TREATMENT   The most common treatment for a PE is blood thinning (anticoagulant) medicine, which reduces the blood's tendency to clot. Anticoagulants can stop new blood clots from forming and old ones from growing. They cannot dissolve existing clots. Your body does this by itself over time. Anticoagulants can be given by mouth, by intravenous (IV) access, or by injection. Your caregiver will determine the best program for you.  Less commonly, clot-dissolving drugs (thrombolytics) are used to dissolve a PE. They carry a high risk of bleeding, so they are used mainly in severe cases.  Very rarely, a blood clot in the leg needs to be removed surgically.  If you are unable to take anticoagulants, your caregiver may arrange for you to have a filter placed in a main vein in your abdomen. This filter prevents clots from traveling to your lungs. HOME CARE INSTRUCTIONS   Take all medicines prescribed by your caregiver. Follow the directions carefully.  Warfarin. Most people will continue taking warfarin after hospital discharge. Your caregiver will advise you on the length of treatment (usually 3 6 months, sometimes lifelong).  Too much and too little warfarin are both dangerous. Too much warfarin increases the risk of bleeding. Too little warfarin continues to allow the risk for blood clots. While taking warfarin, you will need to have  regular blood tests to measure your blood clotting time. These blood tests usually include both the prothrombin time (PT) and International Normalized Ratio (INR) tests. The PT and INR results allow your caregiver to adjust your dose of warfarin. The dose can change for many reasons. It is critically important that you take warfarin exactly as prescribed, and that you have your PT and INR levels drawn exactly as directed.  Many foods, especially foods high in vitamin K can interfere with warfarin and affect the PT and INR results. Foods high in vitamin K include spinach, kale, broccoli, cabbage, collard and turnip greens, brussels sprouts, peas, cauliflower, seaweed, and parsley as well as beef and pork liver, green tea, and soybean oil. You should eat a consistent amount of foods high in vitamin K. Avoid major changes in your diet, or notify your caregiver before changing your diet. Arrange a visit with a dietitian to answer your questions.  Many medicines can interfere with warfarin and affect the PT and INR results. You must tell your caregiver about any  and all medicines you take, this includes all vitamins and supplements. Be especially cautious with aspirin and anti-inflammatory medicines. Ask your caregiver before taking these. Do not take or discontinue any prescribed or over-the-counter medicine except on the advice of your caregiver or pharmacist.  Warfarin can have side effects, such as excessive bruising or bleeding. You will need to hold pressure over cuts for longer than usual.  Alcohol can change the body's ability to handle warfarin. It is best to avoid alcoholic drinks or consume only very small amounts while taking warfarin. Notify your caregiver if you change your alcohol intake.  Notify your dentist or other caregivers before procedures.  Avoid contact sports.  Wear a medical alert bracelet or carry a medical alert card.  Ask your caregiver how soon you can go back to normal  activities. Not being active can lead to new clots. Ask for a list of what you should and should not do.  Compression stockings. These are tight elastic stockings that apply pressure to the lower legs. This can help keep the blood in the legs from clotting. You may need to wear compressions stockings at home to help prevent clots.  Smoking. If you smoke, quit. Ask your caregiver for help with quitting smoking.  Learn as much as you can about PE. Educating yourself can help prevent PE from reoccurring. SEEK MEDICAL CARE IF:   You notice a rapid heartbeat.  You feel weaker or more tired than usual.  You feel faint.  You notice increased bruising.  Your symptoms are not getting better in the time expected.  You are having side effects of medicine. SEEK IMMEDIATE MEDICAL CARE IF:   You have chest pain.  You have trouble breathing.  You have new or increased swelling or pain in one leg.  You cough up blood.  You notice blood in vomit, in a bowel movement, or in urine.  You have an oral temperature above 102 F (38.9 C), not controlled by medicine. You may have another PE. A blood clot in the lungs is a medical emergency. Call your local emergency services (911 in [place] to get to the nearest hospital or clinic. Do not drive yourself. MAKE SURE YOU:   Understand these instructions.  Will watch your condition.  Will get help right away if you are not doing well or get worse. Document Released: 10/06/2000 Document Revised: 04/09/2012 Document Reviewed: 04/12/2009 [facility] Patient Information 2014 [place], [place].

## 2013-12-23 NOTE — Addendum Note (Signed)
Addended by: [name] B on: 12/23/2013 04:03 PM   Modules accepted: Orders

## 2013-12-23 NOTE — Addendum Note (Signed)
Addended by: Brien Few on: 12/23/2013 04:18 PM   Modules accepted: Orders

## 2013-12-23 NOTE — Progress Notes (Addendum)
[place]    OFFICE PROGRESS NOTE   INTERVAL HISTORY:   Crystal [name] returns for scheduled followup of non-Hodgkin's lymphoma. She completed cycle 4 of bendamustine/rituximab on 11/25/2013. She tolerated the chemotherapy well. No palpable lymph nodes in the neck. No specific complaint. She has chronic exertional dyspnea.  Objective:  Vital signs in last 24 hours:  Blood pressure 130/84, pulse 100, temperature 97.3 F (36.3 C), temperature source Oral, resp. rate 19, height 5\' 5"  (1.651 m), weight 237 lb 14.4 oz (107.911 kg), SpO2 100.00%.    HEENT: No thrush or ulcers, the mucous membranes are dry Lymphatics: No cervical, supraclavicular, or axillary nodes Resp: Lungs clear bilaterally Cardio: Regular rate and rhythm, tachycardia GI: No hepatosplenomegaly, nontender Vascular: No leg edema  Portacath/PICC-without erythema  Lab Results:  Lab Results  Component Value Date   WBC 3.4* 12/23/2013   HGB 13.9 12/23/2013   HCT 41.7 12/23/2013   MCV 90.1 12/23/2013   PLT 178 12/23/2013   NEUTROABS 1.4* 12/23/2013      Medications: I have reviewed the patient's current medications.  Assessment/Plan: 1. Low-grade follicular lymphoma involving a right parotid mass, status post an excisional biopsy on 11/28/2010. a. Staging CT scans 01/03/2011 confirmed an increased number of small nodes in the neck, left axilla and pelvis without clear evidence of pathologic lymphadenopathy. b. PET scan 01/11/2011 confirmed hypermetabolic lymph nodes in the right cervical chain, left axillary nodes, periaortic, common iliac, external iliac and inguinal nodes. There was also a possible area of involvement at the right tonsillar region. c. Palpable left posterior cervical nodes confirmed on exam 05/15/2013- progressive left neck nodes on exam 08/18/2013. d. Status post cycle 1 bendamustine/Rituxan beginning 08/28/2013. e. Near-complete resolution of left neck adenopathy on exam  09/12/2013. f. Status post cycle 2 bendamustine/Rituxan beginning 09/25/2013. g. CT abdomen/pelvis 10/01/2013-near-complete response to therapy with isolated borderline enlarged left iliac node measuring 1.37 m. Previously identified right peritoneal right pelvic sidewall adenopathy is resolved. h. Cycle 3 bendamustine/Rituxan beginning 10/28/2013. i. Cycle 4 bendamustine/Rituxan beginning to 3 2015 2. Stage I right-sided breast cancer diagnosed in 1998. 3. History of congestive heart failure. 4. Hypertension. 5. Port-A-Cath placement 08/25/2013 in interventional radiology. 6. Chills during the Rituxan infusion 08/28/2013. She was given Solu-Medrol. Rituxan was resumed and completed. 7. Abdominal pain following cycle 2 bendamustine/Rituxan-no explanation for the pain on a CT 10/01/2013, resolved after starting Protonix. 8. Tachycardia-etiology unclear, we will check an EKG today 9. Mild neutropenia-she will return for a nadir CBC. She knows to contact us for a fever or signs of infection   Disposition:  Crystal [name] remains in clinical remission from non-Hodgkin's lymphoma. The plan is to proceed with cycle 5 bendamustine/rituximab today. We will check an EKG and repeat her vital signs in the chemotherapy infusion room prior to administering treatment. We will consider a cardiology referral if she has persistent tachycardia.  She will be scheduled for an office visit and cycle 6 bendamustine/rituximab in 4 weeks.   [name], MD  12/23/2013  9:20 AM   She was referred for a CT of the chest this afternoon. This confirmed a right sided pulmonary embolism. She will begin xarelto anticoagulation. The tachycardia has improved and her clinical status appears stable for management of the pulmonary embolism as an outpatient. Crystal [name] will return as scheduled for cycle 5 bendamustine/rituximab on 12/24/2013. We will check Dopplers of the legs and the right upper extremity. There is no apparent  source for venous thromboembolism on her  exam today.  Julieanne Manson, M.D.

## 2013-12-23 NOTE — Addendum Note (Signed)
Addended by: Carolynne Edouard B on: 12/23/2013 12:57 PM   Modules accepted: Orders

## 2013-12-23 NOTE — Telephone Encounter (Signed)
Please page Dr Benay Spice when you have time in regards to Crystal [name]. [phone]

## 2013-12-23 NOTE — Progress Notes (Signed)
0957- Patient's PAC accessed, EKG performed. HR-120 radially. No complaints of chest pain at this time.  1147- Patient's IVFs completed, patient was found crying and noticeably upset, stated "I don't know why I'm crying". Patient instructed to take a few mins to relax and see if HR will decrease. HR still ranging from 120-125.  1222- per Dr. Benay Spice, no treatment today

## 2013-12-24 ENCOUNTER — Other Ambulatory Visit: Payer: Self-pay | Admitting: Oncology

## 2013-12-24 ENCOUNTER — Ambulatory Visit (HOSPITAL_BASED_OUTPATIENT_CLINIC_OR_DEPARTMENT_OTHER): Payer: Medicare Other

## 2013-12-24 ENCOUNTER — Telehealth: Payer: Self-pay | Admitting: *Deleted

## 2013-12-24 ENCOUNTER — Telehealth: Payer: Self-pay | Admitting: Oncology

## 2013-12-24 VITALS — BP 105/68 | HR 96 | Temp 98.2°F | Resp 16

## 2013-12-24 DIAGNOSIS — C8589 Other specified types of non-Hodgkin lymphoma, extranodal and solid organ sites: Secondary | ICD-10-CM

## 2013-12-24 DIAGNOSIS — Z5111 Encounter for antineoplastic chemotherapy: Secondary | ICD-10-CM | POA: Diagnosis not present

## 2013-12-24 DIAGNOSIS — Z5112 Encounter for antineoplastic immunotherapy: Secondary | ICD-10-CM

## 2013-12-24 MED ORDER — ACETAMINOPHEN 325 MG PO TABS
650.0000 mg | ORAL_TABLET | Freq: Once | ORAL | Status: AC
  Administered 2013-12-24: 650 mg via ORAL

## 2013-12-24 MED ORDER — SODIUM CHLORIDE 0.9 % IV SOLN
Freq: Once | INTRAVENOUS | Status: AC
  Administered 2013-12-24: 11:00:00 via INTRAVENOUS

## 2013-12-24 MED ORDER — BENDAMUSTINE HCL CHEMO INJECTION 180 MG/2ML
90.0000 mg/m2 | Freq: Once | INTRAVENOUS | Status: AC
  Administered 2013-12-24: 198 mg via INTRAVENOUS
  Filled 2013-12-24: qty 2.2

## 2013-12-24 MED ORDER — ACETAMINOPHEN 325 MG PO TABS
ORAL_TABLET | ORAL | Status: AC
  Filled 2013-12-24: qty 2

## 2013-12-24 MED ORDER — DEXAMETHASONE SODIUM PHOSPHATE 10 MG/ML IJ SOLN
10.0000 mg | Freq: Once | INTRAMUSCULAR | Status: AC
  Administered 2013-12-24: 10 mg via INTRAVENOUS

## 2013-12-24 MED ORDER — SODIUM CHLORIDE 0.9 % IV SOLN
375.0000 mg/m2 | Freq: Once | INTRAVENOUS | Status: AC
  Administered 2013-12-24: 800 mg via INTRAVENOUS
  Filled 2013-12-24: qty 80

## 2013-12-24 MED ORDER — ONDANSETRON 8 MG/50ML IVPB (CHCC)
8.0000 mg | Freq: Once | INTRAVENOUS | Status: AC
  Administered 2013-12-24: 8 mg via INTRAVENOUS

## 2013-12-24 MED ORDER — DIPHENHYDRAMINE HCL 25 MG PO CAPS
50.0000 mg | ORAL_CAPSULE | Freq: Once | ORAL | Status: AC
  Administered 2013-12-24: 50 mg via ORAL
  Filled 2013-12-24: qty 2

## 2013-12-24 NOTE — Telephone Encounter (Signed)
Per staff message and POF I have scheduled appts.  JMW  

## 2013-12-24 NOTE — Patient Instructions (Signed)
[facility] Discharge Instructions for Patients Receiving Chemotherapy  Today you received the following chemotherapy agents: Rituxan and Treanda.  To help prevent nausea and vomiting after your treatment, we encourage you to take your nausea medication.   If you develop nausea and vomiting that is not controlled by your nausea medication, call the clinic.   BELOW ARE SYMPTOMS THAT SHOULD BE REPORTED IMMEDIATELY:  *FEVER GREATER THAN 100.5 F  *CHILLS WITH OR WITHOUT FEVER  NAUSEA AND VOMITING THAT IS NOT CONTROLLED WITH YOUR NAUSEA MEDICATION  *UNUSUAL SHORTNESS OF BREATH  *UNUSUAL BRUISING OR BLEEDING  TENDERNESS IN MOUTH AND THROAT WITH OR WITHOUT PRESENCE OF ULCERS  *URINARY PROBLEMS  *BOWEL PROBLEMS  UNUSUAL RASH Items with * indicate a potential emergency and should be followed up as soon as possible.  Feel free to call the clinic you have any questions or concerns. The clinic phone number is [phone].

## 2013-12-25 ENCOUNTER — Ambulatory Visit (HOSPITAL_COMMUNITY)
Admission: RE | Admit: 2013-12-25 | Discharge: 2013-12-25 | Disposition: A | Discharge status: Home / Self Care | Payer: Medicare Other | Source: Ambulatory Visit | Attending: Oncology | Admitting: Oncology

## 2013-12-25 ENCOUNTER — Ambulatory Visit (HOSPITAL_BASED_OUTPATIENT_CLINIC_OR_DEPARTMENT_OTHER): Payer: Medicare Other

## 2013-12-25 ENCOUNTER — Telehealth: Payer: Self-pay | Admitting: *Deleted

## 2013-12-25 VITALS — BP 113/67 | HR 92 | Temp 97.6°F | Resp 20

## 2013-12-25 DIAGNOSIS — C8589 Other specified types of non-Hodgkin lymphoma, extranodal and solid organ sites: Secondary | ICD-10-CM

## 2013-12-25 DIAGNOSIS — I2699 Other pulmonary embolism without acute cor pulmonale: Secondary | ICD-10-CM

## 2013-12-25 DIAGNOSIS — Z5111 Encounter for antineoplastic chemotherapy: Secondary | ICD-10-CM | POA: Diagnosis not present

## 2013-12-25 DIAGNOSIS — M79609 Pain in unspecified limb: Secondary | ICD-10-CM

## 2013-12-25 MED ORDER — SODIUM CHLORIDE 0.9 % IJ SOLN
10.0000 mL | INTRAMUSCULAR | Status: DC | PRN
  Administered 2013-12-25: 10 mL
  Filled 2013-12-25: qty 10

## 2013-12-25 MED ORDER — DEXAMETHASONE SODIUM PHOSPHATE 10 MG/ML IJ SOLN
INTRAMUSCULAR | Status: AC
  Filled 2013-12-25: qty 1

## 2013-12-25 MED ORDER — ONDANSETRON 8 MG/50ML IVPB (CHCC)
8.0000 mg | Freq: Once | INTRAVENOUS | Status: AC
  Administered 2013-12-25: 8 mg via INTRAVENOUS

## 2013-12-25 MED ORDER — SODIUM CHLORIDE 0.9 % IV SOLN
Freq: Once | INTRAVENOUS | Status: AC
  Administered 2013-12-25: 10:00:00 via INTRAVENOUS

## 2013-12-25 MED ORDER — HEPARIN SOD (PORK) LOCK FLUSH 100 UNIT/ML IV SOLN
500.0000 [IU] | Freq: Once | INTRAVENOUS | Status: AC | PRN
  Administered 2013-12-25: 500 [IU]
  Filled 2013-12-25: qty 5

## 2013-12-25 MED ORDER — ONDANSETRON 8 MG/NS 50 ML IVPB
INTRAVENOUS | Status: AC
  Filled 2013-12-25: qty 8

## 2013-12-25 MED ORDER — SODIUM CHLORIDE 0.9 % IV SOLN
90.0000 mg/m2 | Freq: Once | INTRAVENOUS | Status: AC
  Administered 2013-12-25: 198 mg via INTRAVENOUS
  Filled 2013-12-25: qty 2.2

## 2013-12-25 MED ORDER — DEXAMETHASONE SODIUM PHOSPHATE 10 MG/ML IJ SOLN
10.0000 mg | Freq: Once | INTRAMUSCULAR | Status: AC
  Administered 2013-12-25: 10 mg via INTRAVENOUS

## 2013-12-25 NOTE — Progress Notes (Signed)
Upon assessment, patient c/o bilateral calf and leg soreness. She attributes this to increased ambulating (i.e. Parking farther away in parking lot), but patient does have known clot in other location. Ned Card, NP notified. Pt has scheduled venous doppler this afternoon and Dr. Benay Spice is aware. Pt has started medication for this. OK to proceed with chemotherapy.

## 2013-12-25 NOTE — Telephone Encounter (Signed)
Call from [facility] in vascular lab reporting pt is negative for DVT in BLE and R arm. Dr. Benay Spice made aware.

## 2013-12-25 NOTE — Progress Notes (Signed)
*  Preliminary Results* Bilateral lower extremity venous duplex completed. Bilateral lower extremities are negative for deep vein thrombosis. There is no evidence of Baker's cyst bilaterally.   *Preliminary Results* Right upper extremity venous duplex completed. Right upper extremity is negative for deep and superficial vein thrombosis.  12/25/2013 12:28 PM  Maudry Mayhew, RVT, RDCS, RDMS

## 2013-12-25 NOTE — Patient Instructions (Signed)
[facility] Discharge Instructions for Patients Receiving Chemotherapy  Today you received the following chemotherapy agent: Treanda   To help prevent nausea and vomiting after your treatment, we encourage you to take your nausea medication as prescribed.    If you develop nausea and vomiting that is not controlled by your nausea medication, call the clinic.   BELOW ARE SYMPTOMS THAT SHOULD BE REPORTED IMMEDIATELY:  *FEVER GREATER THAN 100.5 F  *CHILLS WITH OR WITHOUT FEVER  NAUSEA AND VOMITING THAT IS NOT CONTROLLED WITH YOUR NAUSEA MEDICATION  *UNUSUAL SHORTNESS OF BREATH  *UNUSUAL BRUISING OR BLEEDING  TENDERNESS IN MOUTH AND THROAT WITH OR WITHOUT PRESENCE OF ULCERS  *URINARY PROBLEMS  *BOWEL PROBLEMS  UNUSUAL RASH Items with * indicate a potential emergency and should be followed up as soon as possible.  Feel free to call the clinic you have any questions or concerns. The clinic phone number is [phone].

## 2014-01-05 ENCOUNTER — Telehealth: Payer: Self-pay | Admitting: Oncology

## 2014-01-05 ENCOUNTER — Other Ambulatory Visit (HOSPITAL_BASED_OUTPATIENT_CLINIC_OR_DEPARTMENT_OTHER): Payer: Medicare Other

## 2014-01-05 DIAGNOSIS — C8589 Other specified types of non-Hodgkin lymphoma, extranodal and solid organ sites: Secondary | ICD-10-CM

## 2014-01-05 DIAGNOSIS — C8588 Other specified types of non-Hodgkin lymphoma, lymph nodes of multiple sites: Secondary | ICD-10-CM

## 2014-01-05 DIAGNOSIS — I2699 Other pulmonary embolism without acute cor pulmonale: Secondary | ICD-10-CM

## 2014-01-05 LAB — CBC WITH DIFFERENTIAL/PLATELET
BASO%: 1 % (ref 0.0–2.0)
BASOS ABS: 0 10*3/uL (ref 0.0–0.1)
EOS%: 3.3 % (ref 0.0–7.0)
Eosinophils Absolute: 0.1 10*3/uL (ref 0.0–0.5)
HCT: 39.4 % (ref 34.8–46.6)
HEMOGLOBIN: 13.2 g/dL (ref 11.6–15.9)
LYMPH#: 0.3 10*3/uL — AB (ref 0.9–3.3)
LYMPH%: 13.9 % — ABNORMAL LOW (ref 14.0–49.7)
MCH: 30.2 pg (ref 25.1–34.0)
MCHC: 33.5 g/dL (ref 31.5–36.0)
MCV: 90.2 fL (ref 79.5–101.0)
MONO#: 0.4 10*3/uL (ref 0.1–0.9)
MONO%: 19.1 % — ABNORMAL HIGH (ref 0.0–14.0)
NEUT#: 1.3 10*3/uL — ABNORMAL LOW (ref 1.5–6.5)
NEUT%: 62.7 % (ref 38.4–76.8)
Platelets: 204 10*3/uL (ref 145–400)
RBC: 4.37 10*6/uL (ref 3.70–5.45)
RDW: 15.3 % — ABNORMAL HIGH (ref 11.2–14.5)
WBC: 2.1 10*3/uL — AB (ref 3.9–10.3)
nRBC: 0 % (ref 0–0)

## 2014-01-05 NOTE — Telephone Encounter (Signed)
Gave pt appt for nutrition appt for tomorrow

## 2014-01-06 ENCOUNTER — Ambulatory Visit: Payer: Medicare Other | Admitting: Nutrition

## 2014-01-06 NOTE — Progress Notes (Signed)
68 year old female diagnosed with non-Hodgkin's lymphoma.  She is a patient of Dr. Benay Spice.  She will complete treatment.  The end of this month.  Past medical history includes CHF, rest, cancer, hypertension, cardiomyopathy, and dyslipidemia.  Medications include Protonix, Compazine.  Labs include sodium 146, potassium 3.3, albumin 3.3 on March 3.  Height: 65 inches. Weight: 237.9 pounds. Usual body weight: 235 pounds. BMI: 39.59.   Patient requesting information on healthy diet.  She would like to lose weight.  She is exercising 5 days a week about 30 minutes at a time.  Her granddaughter is her "gym buddy".  Patient reports she generally consume something sweet for breakfast such as a donut or cinnamon bun.  She sometimes will consume oatmeal.  She is snacking on fruit midmorning.  She typically eats a salad or a sandwich at lunch time.  She often skips dinner and snacks throughout the evening.  Nutrition diagnosis: Obesity related to excessive energy intake as evidenced by grade 2 Obesity, with BMI 39.59.  Intervention: Patient was educated on a healthy, plant-based diet to include lean proteins.  Patient was encouraged to consume 3 meals daily with an appropriate mixture of macronutrients.  Provided specific examples of healthy breakfast patient can substitute for donuts or cinnamon bun.  Encouraged patient to plan meals and shop for groceries according to her menu.  Educated patient on the importance of documenting food intake in a Journal.  Provided patient with fact sheets.   Questions were answered.  Teach back method used.  Monitoring, evaluation, goals: Patient will modify diet to include regular meals and snacks.  Following a plant-based diet to result in slow, safe weight loss.  Next visit: Tuesday, March 31, during chemotherapy.

## 2014-01-18 ENCOUNTER — Other Ambulatory Visit: Payer: Self-pay | Admitting: Oncology

## 2014-01-20 ENCOUNTER — Ambulatory Visit (HOSPITAL_BASED_OUTPATIENT_CLINIC_OR_DEPARTMENT_OTHER): Payer: Medicare Other | Admitting: Nurse Practitioner

## 2014-01-20 ENCOUNTER — Telehealth: Payer: Self-pay | Admitting: *Deleted

## 2014-01-20 ENCOUNTER — Telehealth: Payer: Self-pay | Admitting: Oncology

## 2014-01-20 ENCOUNTER — Ambulatory Visit: Payer: Medicare Other

## 2014-01-20 ENCOUNTER — Other Ambulatory Visit (HOSPITAL_BASED_OUTPATIENT_CLINIC_OR_DEPARTMENT_OTHER): Payer: Medicare Other

## 2014-01-20 ENCOUNTER — Encounter: Payer: Medicare Other | Admitting: Nutrition

## 2014-01-20 VITALS — BP 132/74 | HR 96 | Temp 97.9°F | Resp 19 | Ht 65.0 in | Wt 238.1 lb

## 2014-01-20 DIAGNOSIS — I509 Heart failure, unspecified: Secondary | ICD-10-CM | POA: Diagnosis not present

## 2014-01-20 DIAGNOSIS — R000 Tachycardia, unspecified: Secondary | ICD-10-CM

## 2014-01-20 DIAGNOSIS — I2699 Other pulmonary embolism without acute cor pulmonale: Secondary | ICD-10-CM

## 2014-01-20 DIAGNOSIS — I10 Essential (primary) hypertension: Secondary | ICD-10-CM

## 2014-01-20 DIAGNOSIS — C8298 Follicular lymphoma, unspecified, lymph nodes of multiple sites: Secondary | ICD-10-CM

## 2014-01-20 DIAGNOSIS — D709 Neutropenia, unspecified: Secondary | ICD-10-CM | POA: Diagnosis not present

## 2014-01-20 DIAGNOSIS — C8589 Other specified types of non-Hodgkin lymphoma, extranodal and solid organ sites: Secondary | ICD-10-CM

## 2014-01-20 LAB — CBC WITH DIFFERENTIAL/PLATELET
BASO%: 1 % (ref 0.0–2.0)
BASOS ABS: 0 10*3/uL (ref 0.0–0.1)
EOS ABS: 0.1 10*3/uL (ref 0.0–0.5)
EOS%: 3.8 % (ref 0.0–7.0)
HCT: 39.5 % (ref 34.8–46.6)
HGB: 13.3 g/dL (ref 11.6–15.9)
LYMPH%: 29.2 % (ref 14.0–49.7)
MCH: 30.3 pg (ref 25.1–34.0)
MCHC: 33.7 g/dL (ref 31.5–36.0)
MCV: 90 fL (ref 79.5–101.0)
MONO#: 0.3 10*3/uL (ref 0.1–0.9)
MONO%: 12.9 % (ref 0.0–14.0)
NEUT%: 53.1 % (ref 38.4–76.8)
NEUTROS ABS: 1.1 10*3/uL — AB (ref 1.5–6.5)
PLATELETS: 155 10*3/uL (ref 145–400)
RBC: 4.39 10*6/uL (ref 3.70–5.45)
RDW: 15.4 % — ABNORMAL HIGH (ref 11.2–14.5)
WBC: 2.1 10*3/uL — ABNORMAL LOW (ref 3.9–10.3)
lymph#: 0.6 10*3/uL — ABNORMAL LOW (ref 0.9–3.3)

## 2014-01-20 MED ORDER — RIVAROXABAN 20 MG PO TABS: 20.0000 mg | ORAL_TABLET | Freq: Every day | ORAL | Status: DC

## 2014-01-20 NOTE — Telephone Encounter (Signed)
gv and printed appts ched and avs for pt for April adn May.....MW added tx.

## 2014-01-20 NOTE — Telephone Encounter (Signed)
Per staff message and POF I have scheduled appts.  JMW  

## 2014-01-20 NOTE — Progress Notes (Addendum)
[place] OFFICE PROGRESS NOTE   Diagnosis:  Non-Hodgkin's lymphoma.  INTERVAL HISTORY:   Crystal [name] returns as scheduled. She completed cycle 5 of bendamustine/Rituxan beginning 12/24/2013. She denies nausea/vomiting. No mouth sores. No diarrhea. No numbness or tingling in her hands or feet. She has stable mild dyspnea on exertion. Recent mild cough. No fever. She continues Xarelto. No bleeding.  Objective:  Vital signs in last 24 hours:  Blood pressure 132/74, pulse 96, temperature 97.9 F (36.6 C), temperature source Oral, resp. rate 19, height 5\' 5"  (1.651 m), weight 238 lb 1.6 oz (108.001 kg), SpO2 98.00%.    HEENT: No thrush or ulcerations. Mucous membranes appear dry. Lymphatics: No palpable cervical or supraclavicular lymph nodes. Resp: Faint rales right lung base. No respiratory distress. Cardio: Regular cardiac rhythm. Rate within normal limits. GI: Abdomen soft and nontender. No organomegaly. Vascular: No leg edema.   Portacath/PICC-without erythema.  Lab Results:  Lab Results  Component Value Date   WBC 2.1* 01/20/2014   HGB 13.3 01/20/2014   HCT 39.5 01/20/2014   MCV 90.0 01/20/2014   PLT 155 01/20/2014   NEUTROABS 1.1* 01/20/2014      Imaging:  No results found.  Medications: I have reviewed the patient's current medications.  Assessment/Plan: 1. Low-grade follicular lymphoma involving a right parotid mass, status post an excisional biopsy on 11/28/2010. a. Staging CT scans 01/03/2011 confirmed an increased number of small nodes in the neck, left axilla and pelvis without clear evidence of pathologic lymphadenopathy. b. PET scan 01/11/2011 confirmed hypermetabolic lymph nodes in the right cervical chain, left axillary nodes, periaortic, common iliac, external iliac and inguinal nodes. There was also a possible area of involvement at the right tonsillar region. c. Palpable left posterior cervical nodes confirmed on exam 05/15/2013-  progressive left neck nodes on exam 08/18/2013. d. Status post cycle 1 bendamustine/Rituxan beginning 08/28/2013. e. Near-complete resolution of left neck adenopathy on exam 09/12/2013. f. Status post cycle 2 bendamustine/Rituxan beginning 09/25/2013. g. CT abdomen/pelvis 10/01/2013-near-complete response to therapy with isolated borderline enlarged left iliac node measuring 1.37 m. Previously identified right peritoneal right pelvic sidewall adenopathy is resolved. h. Cycle 3 bendamustine/Rituxan beginning 10/28/2013. i. Cycle 4 bendamustine/Rituxan beginning 11/25/2013. j. Cycle 5 of bendamustine/Rituxan beginning 12/24/2013. 2. Stage I right-sided breast cancer diagnosed in 1998. 3. History of congestive heart failure. 4. Hypertension. 5. Port-A-Cath placement 08/25/2013 in interventional radiology. 6. Chills during the Rituxan infusion 08/28/2013. She was given Solu-Medrol. Rituxan was resumed and completed. 7. Abdominal pain following cycle 2 bendamustine/Rituxan-no explanation for the pain on a CT 10/01/2013, resolved after starting Protonix. 8. Tachycardia 12/23/2013. Chest CT showed a pulmonary embolus. 9. Chest CT 12/23/2013. Small nonocclusive right lower lobe pulmonary embolus. Minimal thrombus burden. No other emboli demonstrated. Xarelto initiated.right upper extremity and bilateral lower extremity Dopplers negative on 12/25/2013.   Disposition: She appears stable. She has completed 5 cycles of bendamustine/Rituxan.   She is mildly neutropenic on labs today. We will hold cycle 6 and reschedule for one week (01/27/2014).   She will return for a followup visit on 03/03/2014. She will contact the office in the interim with any problems.   Patient seen with Dr. Benay Spice.    Crystal [name] ANP/GNP-BC   01/20/2014  9:51 AM  This was a shared visit with Crystal [name].  The final cycle of bendamustine/rituximab will be held for one week secondary to neutropenia. She continues  xarelto for treatment of the pulmonary embolism. I recommend 3 months of anticoagulation therapy. Review  of the 12/23/2013 CT and previous CTs reveals there was a questionable abnormality in the right pulmonary artery embolism site on a previous study.  Crystal [name], M.D.

## 2014-01-21 ENCOUNTER — Ambulatory Visit: Payer: Medicare Other

## 2014-01-26 ENCOUNTER — Other Ambulatory Visit: Payer: Self-pay | Admitting: Oncology

## 2014-01-26 ENCOUNTER — Other Ambulatory Visit: Payer: Self-pay

## 2014-01-26 DIAGNOSIS — C8589 Other specified types of non-Hodgkin lymphoma, extranodal and solid organ sites: Secondary | ICD-10-CM

## 2014-01-27 ENCOUNTER — Other Ambulatory Visit (HOSPITAL_BASED_OUTPATIENT_CLINIC_OR_DEPARTMENT_OTHER): Payer: Medicare Other

## 2014-01-27 ENCOUNTER — Ambulatory Visit (HOSPITAL_BASED_OUTPATIENT_CLINIC_OR_DEPARTMENT_OTHER): Payer: Medicare Other

## 2014-01-27 ENCOUNTER — Ambulatory Visit: Payer: Medicare Other | Admitting: Nutrition

## 2014-01-27 VITALS — BP 121/63 | HR 91 | Temp 97.4°F | Resp 18

## 2014-01-27 DIAGNOSIS — C8298 Follicular lymphoma, unspecified, lymph nodes of multiple sites: Secondary | ICD-10-CM | POA: Diagnosis not present

## 2014-01-27 DIAGNOSIS — C8589 Other specified types of non-Hodgkin lymphoma, extranodal and solid organ sites: Secondary | ICD-10-CM

## 2014-01-27 DIAGNOSIS — Z5111 Encounter for antineoplastic chemotherapy: Secondary | ICD-10-CM

## 2014-01-27 LAB — MAGNESIUM (CC13): MAGNESIUM: 1.9 mg/dL (ref 1.5–2.5)

## 2014-01-27 LAB — COMPREHENSIVE METABOLIC PANEL (CC13)
ALT: 24 U/L (ref 0–55)
ANION GAP: 10 meq/L (ref 3–11)
AST: 27 U/L (ref 5–34)
Albumin: 3.2 g/dL — ABNORMAL LOW (ref 3.5–5.0)
Alkaline Phosphatase: 111 U/L (ref 40–150)
BILIRUBIN TOTAL: 0.3 mg/dL (ref 0.20–1.20)
BUN: 18.4 mg/dL (ref 7.0–26.0)
CO2: 27 meq/L (ref 22–29)
CREATININE: 1 mg/dL (ref 0.6–1.1)
Calcium: 9.2 mg/dL (ref 8.4–10.4)
Chloride: 110 mEq/L — ABNORMAL HIGH (ref 98–109)
Glucose: 132 mg/dl (ref 70–140)
Potassium: 4.1 mEq/L (ref 3.5–5.1)
SODIUM: 147 meq/L — AB (ref 136–145)
TOTAL PROTEIN: 6.4 g/dL (ref 6.4–8.3)

## 2014-01-27 LAB — CBC WITH DIFFERENTIAL/PLATELET
BASO%: 0.8 % (ref 0.0–2.0)
Basophils Absolute: 0 10*3/uL (ref 0.0–0.1)
EOS ABS: 0.1 10*3/uL (ref 0.0–0.5)
EOS%: 3.3 % (ref 0.0–7.0)
HEMATOCRIT: 39.2 % (ref 34.8–46.6)
HGB: 13 g/dL (ref 11.6–15.9)
LYMPH%: 30.1 % (ref 14.0–49.7)
MCH: 30 pg (ref 25.1–34.0)
MCHC: 33.1 g/dL (ref 31.5–36.0)
MCV: 90.9 fL (ref 79.5–101.0)
MONO#: 0.3 10*3/uL (ref 0.1–0.9)
MONO%: 13.1 % (ref 0.0–14.0)
NEUT#: 1.4 10*3/uL — ABNORMAL LOW (ref 1.5–6.5)
NEUT%: 52.7 % (ref 38.4–76.8)
Platelets: 173 10*3/uL (ref 145–400)
RBC: 4.31 10*6/uL (ref 3.70–5.45)
RDW: 15.6 % — ABNORMAL HIGH (ref 11.2–14.5)
WBC: 2.6 10*3/uL — ABNORMAL LOW (ref 3.9–10.3)
lymph#: 0.8 10*3/uL — ABNORMAL LOW (ref 0.9–3.3)

## 2014-01-27 MED ORDER — SODIUM CHLORIDE 0.9 % IV SOLN
Freq: Once | INTRAVENOUS | Status: AC
  Administered 2014-01-27: 10:00:00 via INTRAVENOUS

## 2014-01-27 MED ORDER — ONDANSETRON 8 MG/NS 50 ML IVPB
INTRAVENOUS | Status: AC
  Filled 2014-01-27: qty 8

## 2014-01-27 MED ORDER — DIPHENHYDRAMINE HCL 25 MG PO CAPS
50.0000 mg | ORAL_CAPSULE | Freq: Once | ORAL | Status: AC
  Administered 2014-01-27: 50 mg via ORAL

## 2014-01-27 MED ORDER — ACETAMINOPHEN 325 MG PO TABS
ORAL_TABLET | ORAL | Status: AC
  Filled 2014-01-27: qty 2

## 2014-01-27 MED ORDER — DEXAMETHASONE SODIUM PHOSPHATE 10 MG/ML IJ SOLN
INTRAMUSCULAR | Status: AC
  Filled 2014-01-27: qty 1

## 2014-01-27 MED ORDER — SODIUM CHLORIDE 0.9 % IJ SOLN
10.0000 mL | INTRAMUSCULAR | Status: DC | PRN
  Administered 2014-01-27: 10 mL
  Filled 2014-01-27: qty 10

## 2014-01-27 MED ORDER — SODIUM CHLORIDE 0.9 % IV SOLN
375.0000 mg/m2 | Freq: Once | INTRAVENOUS | Status: AC
  Administered 2014-01-27: 800 mg via INTRAVENOUS
  Filled 2014-01-27: qty 80

## 2014-01-27 MED ORDER — SODIUM CHLORIDE 0.9 % IV SOLN
90.0000 mg/m2 | Freq: Once | INTRAVENOUS | Status: AC
  Administered 2014-01-27: 198 mg via INTRAVENOUS
  Filled 2014-01-27: qty 2.2

## 2014-01-27 MED ORDER — DEXAMETHASONE SODIUM PHOSPHATE 10 MG/ML IJ SOLN
10.0000 mg | Freq: Once | INTRAMUSCULAR | Status: AC
  Administered 2014-01-27: 10 mg via INTRAVENOUS

## 2014-01-27 MED ORDER — DIPHENHYDRAMINE HCL 25 MG PO CAPS
ORAL_CAPSULE | ORAL | Status: AC
  Filled 2014-01-27: qty 2

## 2014-01-27 MED ORDER — ACETAMINOPHEN 325 MG PO TABS
650.0000 mg | ORAL_TABLET | Freq: Once | ORAL | Status: AC
  Administered 2014-01-27: 650 mg via ORAL

## 2014-01-27 MED ORDER — ONDANSETRON 8 MG/50ML IVPB (CHCC)
8.0000 mg | Freq: Once | INTRAVENOUS | Status: AC
  Administered 2014-01-27: 8 mg via INTRAVENOUS

## 2014-01-27 MED ORDER — HEPARIN SOD (PORK) LOCK FLUSH 100 UNIT/ML IV SOLN
500.0000 [IU] | Freq: Once | INTRAVENOUS | Status: AC | PRN
  Administered 2014-01-27: 500 [IU]
  Filled 2014-01-27: qty 5

## 2014-01-27 NOTE — Progress Notes (Signed)
Brief followup completed with patient in the chemotherapy room.  Patient reports she is feeling well.  She has a good appetite.  Patient denies nutritional issues at this time.  Nutrition diagnosis: Obesity, continues.  Intervention: Patient educated to continue healthy diet with increased activity.  Monitoring, evaluation, goals: Patient will modify diet to include regular meals and snacks following a plant-based diet to promote slow, safe weight loss.  Next visit: No followup scheduled.

## 2014-01-27 NOTE — Progress Notes (Signed)
Ok to treat with Rituxan and Treanda with ANC 1.4 per Dr Benay Spice

## 2014-01-27 NOTE — Patient Instructions (Signed)
[facility] Discharge Instructions for Patients Receiving Chemotherapy  Today you received the following chemotherapy agents Rituxan and Treanda.  To help prevent nausea and vomiting after your treatment, we encourage you to take your nausea medication.   If you develop nausea and vomiting that is not controlled by your nausea medication, call the clinic.   BELOW ARE SYMPTOMS THAT SHOULD BE REPORTED IMMEDIATELY:  *FEVER GREATER THAN 100.5 F  *CHILLS WITH OR WITHOUT FEVER  NAUSEA AND VOMITING THAT IS NOT CONTROLLED WITH YOUR NAUSEA MEDICATION  *UNUSUAL SHORTNESS OF BREATH  *UNUSUAL BRUISING OR BLEEDING  TENDERNESS IN MOUTH AND THROAT WITH OR WITHOUT PRESENCE OF ULCERS  *URINARY PROBLEMS  *BOWEL PROBLEMS  UNUSUAL RASH Items with * indicate a potential emergency and should be followed up as soon as possible.  Feel free to call the clinic you have any questions or concerns. The clinic phone number is [phone].

## 2014-01-28 ENCOUNTER — Ambulatory Visit (HOSPITAL_BASED_OUTPATIENT_CLINIC_OR_DEPARTMENT_OTHER): Payer: Medicare Other

## 2014-01-28 ENCOUNTER — Other Ambulatory Visit: Payer: Self-pay

## 2014-01-28 VITALS — BP 144/85 | HR 109 | Temp 97.2°F

## 2014-01-28 DIAGNOSIS — C8589 Other specified types of non-Hodgkin lymphoma, extranodal and solid organ sites: Secondary | ICD-10-CM | POA: Diagnosis not present

## 2014-01-28 DIAGNOSIS — Z5111 Encounter for antineoplastic chemotherapy: Secondary | ICD-10-CM

## 2014-01-28 MED ORDER — DEXAMETHASONE SODIUM PHOSPHATE 10 MG/ML IJ SOLN
INTRAMUSCULAR | Status: AC
  Filled 2014-01-28: qty 1

## 2014-01-28 MED ORDER — SODIUM CHLORIDE 0.9 % IV SOLN
90.0000 mg/m2 | Freq: Once | INTRAVENOUS | Status: AC
  Administered 2014-01-28: 198 mg via INTRAVENOUS
  Filled 2014-01-28: qty 2.2

## 2014-01-28 MED ORDER — ONDANSETRON 8 MG/NS 50 ML IVPB
INTRAVENOUS | Status: AC
  Filled 2014-01-28: qty 8

## 2014-01-28 MED ORDER — ONDANSETRON 8 MG/50ML IVPB (CHCC)
8.0000 mg | Freq: Once | INTRAVENOUS | Status: AC
  Administered 2014-01-28: 8 mg via INTRAVENOUS

## 2014-01-28 MED ORDER — SODIUM CHLORIDE 0.9 % IV SOLN
Freq: Once | INTRAVENOUS | Status: AC
  Administered 2014-01-28: 14:00:00 via INTRAVENOUS

## 2014-01-28 MED ORDER — HEPARIN SOD (PORK) LOCK FLUSH 100 UNIT/ML IV SOLN
500.0000 [IU] | Freq: Once | INTRAVENOUS | Status: AC | PRN
  Administered 2014-01-28: 500 [IU]
  Filled 2014-01-28: qty 5

## 2014-01-28 MED ORDER — SODIUM CHLORIDE 0.9 % IJ SOLN
10.0000 mL | INTRAMUSCULAR | Status: DC | PRN
  Administered 2014-01-28: 10 mL
  Filled 2014-01-28: qty 10

## 2014-01-28 MED ORDER — DEXAMETHASONE SODIUM PHOSPHATE 10 MG/ML IJ SOLN
10.0000 mg | Freq: Once | INTRAMUSCULAR | Status: AC
  Administered 2014-01-28: 10 mg via INTRAVENOUS

## 2014-01-28 NOTE — Patient Instructions (Signed)
[facility] Discharge Instructions for Patients Receiving Chemotherapy  Today you received the following chemotherapy agents treanda.   To help prevent nausea and vomiting after your treatment, we encourage you to take your nausea medication as directed.    If you develop nausea and vomiting that is not controlled by your nausea medication, call the clinic.   BELOW ARE SYMPTOMS THAT SHOULD BE REPORTED IMMEDIATELY:  *FEVER GREATER THAN 100.5 F  *CHILLS WITH OR WITHOUT FEVER  NAUSEA AND VOMITING THAT IS NOT CONTROLLED WITH YOUR NAUSEA MEDICATION  *UNUSUAL SHORTNESS OF BREATH  *UNUSUAL BRUISING OR BLEEDING  TENDERNESS IN MOUTH AND THROAT WITH OR WITHOUT PRESENCE OF ULCERS  *URINARY PROBLEMS  *BOWEL PROBLEMS  UNUSUAL RASH Items with * indicate a potential emergency and should be followed up as soon as possible.  Feel free to call the clinic you have any questions or concerns. The clinic phone number is [phone].

## 2014-02-10 ENCOUNTER — Other Ambulatory Visit (HOSPITAL_BASED_OUTPATIENT_CLINIC_OR_DEPARTMENT_OTHER): Payer: Medicare Other

## 2014-02-10 DIAGNOSIS — C8298 Follicular lymphoma, unspecified, lymph nodes of multiple sites: Secondary | ICD-10-CM | POA: Diagnosis not present

## 2014-02-10 DIAGNOSIS — C8589 Other specified types of non-Hodgkin lymphoma, extranodal and solid organ sites: Secondary | ICD-10-CM

## 2014-02-10 LAB — CBC WITH DIFFERENTIAL/PLATELET
BASO%: 0.8 % (ref 0.0–2.0)
BASOS ABS: 0 10*3/uL (ref 0.0–0.1)
EOS ABS: 0 10*3/uL (ref 0.0–0.5)
EOS%: 1.6 % (ref 0.0–7.0)
HCT: 38.7 % (ref 34.8–46.6)
HGB: 12.8 g/dL (ref 11.6–15.9)
LYMPH#: 0.9 10*3/uL (ref 0.9–3.3)
LYMPH%: 36.7 % (ref 14.0–49.7)
MCH: 30 pg (ref 25.1–34.0)
MCHC: 33.1 g/dL (ref 31.5–36.0)
MCV: 90.6 fL (ref 79.5–101.0)
MONO#: 0.5 10*3/uL (ref 0.1–0.9)
MONO%: 20.6 % — ABNORMAL HIGH (ref 0.0–14.0)
NEUT%: 40.3 % (ref 38.4–76.8)
NEUTROS ABS: 1 10*3/uL — AB (ref 1.5–6.5)
Platelets: 161 10*3/uL (ref 145–400)
RBC: 4.27 10*6/uL (ref 3.70–5.45)
RDW: 15.7 % — AB (ref 11.2–14.5)
WBC: 2.5 10*3/uL — ABNORMAL LOW (ref 3.9–10.3)

## 2014-02-11 ENCOUNTER — Ambulatory Visit (INDEPENDENT_AMBULATORY_CARE_PROVIDER_SITE_OTHER): Payer: Medicare Other | Admitting: Neurology

## 2014-02-11 ENCOUNTER — Encounter: Payer: Self-pay | Admitting: Neurology

## 2014-02-11 VITALS — BP 104/68 | HR 70 | Ht 62.5 in | Wt 236.0 lb

## 2014-02-11 DIAGNOSIS — R251 Tremor, unspecified: Secondary | ICD-10-CM | POA: Insufficient documentation

## 2014-02-11 DIAGNOSIS — R259 Unspecified abnormal involuntary movements: Secondary | ICD-10-CM

## 2014-02-11 NOTE — Progress Notes (Addendum)
Reason for visit: Tremor  Crystal [name] is a 68 y.o. female  History of present illness:  Crystal [name] is a 68 year old white female with a history of problems with leg tremors that have been present since 1989. The patient indicates that over time, the issue has not significantly worsened. The patient reports an unusual history of problems with feeling tremor in the legs and throughout the body when she stands still. The patient cannot remain standing for very long. The patient indicates that she can prolong the period time which is able to remain standing back moving and walking. The patient however, cannot remain standing for indefinite periods of time. The patient will have to sit down frequently and rest. As soon as she sits down, she feels better. Associated with the tremulousness is profuse sweating, and the patient also reports onset of urinary urgency, and sensation of needing to have a bowel movement, occasionally with diarrhea. The patient indicates that this has been a problem for her since 1989. The patient had breast cancer in 1999, and then subsequently she developed lymphoma. The patient has undergone chemotherapy for both of these issues. The patient denies headache, and she denies any blackouts or falls. The patient reports no focal numbness or weakness of the extremities. At times, the patient indicates that she may appear flushed. The patient is sent to this office for further evaluation.  Past Medical History  Diagnosis Date  . Hypertension   . Nonischemic cardiomyopathy     ? Doxorubincin induced; essentially resolved as of echo in January 2014, current EF 50-55%. Grade 1 diastolic dysfunction.  . Dyslipidemia   . Anxiety   . Severe obesity (BMI >= 40) 05/21/2013  . Lymphoma     lymphoma dx 11/28/10 - left neck  . non hodgkins lymphoma 12/2010  . Breast cancer 1998    (Rt) lumpectomy dx 1999; Dr. Benay Spice  . Obesity     Past Surgical History  Procedure Laterality Date    . Abdominal hysterectomy    . Breast lumpectomy    . Cardiac catheterization  March 2009     None coronary disease, EF 45% (up from nuclear study EF of 30% in June '08)  . Doppler echocardiography  January 2014    EF 50-55%, no regional wall motion and melena. Grade 1 diastolic dysfunction, abnormal relaxation. Normal pressures. Mild aortic sclerosis, no stenosis.  . Portacath placement  August 25, 2013    rt. with tip in cavoatrial junction, Dr.Yamagata   . Colonoscopy      Family History  Problem Relation Age of Onset  . Heart Problems Mother     CABG  . Heart Problems Father   . Heart Problems Maternal Grandmother   . Stroke Maternal Grandfather   . Cancer Paternal Grandmother     stomach  . Heart Problems Paternal Grandfather   . Hypertension Brother     and lupus  . Heart attack Sister   . Hypertension Sister     x2    Social history:  reports that she has never smoked. She has never used smokeless tobacco. She reports that she drinks alcohol. She reports that she does not use illicit drugs.  Medications:  Current Outpatient Prescriptions on File Prior to Visit  Medication Sig Dispense Refill  . aspirin EC 81 MG tablet Take 81 mg by mouth every morning.      . dicyclomine (BENTYL) 20 MG tablet Take 40 mg by mouth 2 (two) times daily.       Marland Kitchen  diphenhydramine-acetaminophen (TYLENOL PM) 25-500 MG TABS Take 1 tablet by mouth at bedtime as needed.      . lidocaine-prilocaine (EMLA) cream Apply 1 application topically daily as needed (For port-a-cath.).      Marland Kitchen losartan-hydrochlorothiazide (HYZAAR) 50-12.5 MG per tablet Take 1 tablet by mouth every morning.       . metoprolol (LOPRESSOR) 50 MG tablet TAKE 1 TABLET TWICE DAILY  180 tablet  3  . pantoprazole (PROTONIX) 40 MG tablet TAKE 1 TABLET BY MOUTH ONCE DAILY  30 tablet  1  . prochlorperazine (COMPAZINE) 5 MG tablet Take 1 to 2 tablets every 6 hours as needed foe nausea and vomiting  60 tablet  2  . Rivaroxaban  (XARELTO) 20 MG TABS tablet Take 1 tablet (20 mg total) by mouth daily with supper.  30 tablet  2   No current facility-administered medications on file prior to visit.      Allergies  Allergen Reactions  . Codeine Other (See Comments)    Bad Headaches  . Coreg Other (See Comments)    "Made my legs hurt"  . Lisinopril Cough    ROS:  Out of a complete 14 system review of symptoms, the patient complains only of the following symptoms, and all other reviewed systems are negative.  Shortness of breath, cough, snoring Easy bruising Weakness  Blood pressure 104/68, pulse 70, height 5' 2.5" (1.588 m), weight 236 lb (107.049 kg).  Blood pressure, standing, left arm is 118/82. Heart rate is 116. With sitting, left arm, blood pressure is 128/82. Heart rate is 100.  Physical Exam  General: The patient is alert and cooperative at the time of the examination. The patient is moderately obese.  Eyes: Pupils are equal, round, and reactive to light. Discs are flat bilaterally.  Neck: The neck is supple, no carotid bruits are noted.  Respiratory: The respiratory examination is clear.  Cardiovascular: The cardiovascular examination reveals a regular rate and rhythm, no obvious murmurs or rubs are noted.  Skin: Extremities are without significant edema.  Neurologic Exam  Mental status: The patient is alert and oriented x 3 at the time of the examination. The patient has apparent normal recent and remote memory, with an apparently normal attention span and concentration ability.  Cranial nerves: Facial symmetry is present. There is good sensation of the face to pinprick and soft touch bilaterally. The strength of the facial muscles and the muscles to head turning and shoulder shrug are normal bilaterally. Speech is well enunciated, no aphasia or dysarthria is noted. Extraocular movements are full. Visual fields are full. The tongue is midline, and the patient has symmetric elevation of the  soft palate. No obvious hearing deficits are noted.  Motor: The motor testing reveals 5 over 5 strength of all 4 extremities. Good symmetric motor tone is noted throughout.  Sensory: Sensory testing is intact to pinprick, soft touch, vibration sensation, and position sense on all 4 extremities. No evidence of extinction is noted.  Coordination: Cerebellar testing reveals good finger-nose-finger and heel-to-shin bilaterally.  Gait and station: Gait is normal. Tandem gait is normal. Romberg is negative. No drift is seen. With standing, the patient developed an orthostatic tremor with the legs, tremors this with the arms, and diaphoresis.  Reflexes: Deep tendon reflexes are symmetric and normal bilaterally. Toes are downgoing bilaterally.   Assessment/Plan:  1. Leg tremors  2. History of lymphoma  3. History breast cancer  The patient appears to have an autonomic dysfunction syndrome associated with tachycardia,  orthostatic tremor, sweats, urinary and bowel urgency. The patient will be sent for MRI the brain to exclude the possibility of a hypothalamic hamartoma. The patient may have a autonomic ganglion neuropathy disorder, possibly autoimmune. The nicotinic acetylcholine receptor antibodies have been known to elicit an autonomic dysfunction syndrome. The patient will need to be evaluated for a connective tissue disorder such as Sjogren syndrome, lupus, celiac disease, or possible amyloidosis. If the MRI is unremarkable, further blood work will need to be done. The patient will followup in 4-6 months. Autonomic nervous system dysfunction may be a paraneoplastic syndrome as well, and anti-Hu and LEMS antibodies may produce this syndrome.  Addendum: Upon review of the literature, the patient may have a chronic form of POTS. Diaphoresis and tremor is reported with this disorder, and this would present with a very chronic and stable autonomic dysfunction syndrome. A pheochromocytoma or a carcinoid  tumor syndrome would be less likely.  Jill Alexanders MD 02/11/2014 7:10 PM  [facility] [address] [place], [place] [address]  Phone [phone] Fax [phone]

## 2014-02-15 ENCOUNTER — Ambulatory Visit
Admission: RE | Admit: 2014-02-15 | Discharge: 2014-02-15 | Disposition: A | Discharge status: Home / Self Care | Payer: Medicare Other | Source: Ambulatory Visit | Attending: Neurology | Admitting: Neurology

## 2014-02-15 DIAGNOSIS — R251 Tremor, unspecified: Secondary | ICD-10-CM

## 2014-02-15 DIAGNOSIS — R279 Unspecified lack of coordination: Secondary | ICD-10-CM

## 2014-02-15 DIAGNOSIS — R259 Unspecified abnormal involuntary movements: Secondary | ICD-10-CM | POA: Diagnosis not present

## 2014-02-15 MED ORDER — GADOBENATE DIMEGLUMINE 529 MG/ML IV SOLN
20.0000 mL | Freq: Once | INTRAVENOUS | Status: AC | PRN
  Administered 2014-02-15: 20 mL via INTRAVENOUS

## 2014-02-16 ENCOUNTER — Other Ambulatory Visit (INDEPENDENT_AMBULATORY_CARE_PROVIDER_SITE_OTHER): Payer: Self-pay

## 2014-02-16 ENCOUNTER — Telehealth: Payer: Self-pay | Admitting: *Deleted

## 2014-02-16 DIAGNOSIS — G909 Disorder of the autonomic nervous system, unspecified: Secondary | ICD-10-CM | POA: Diagnosis not present

## 2014-02-16 DIAGNOSIS — Z0289 Encounter for other administrative examinations: Secondary | ICD-10-CM

## 2014-02-16 DIAGNOSIS — G901 Familial dysautonomia [Riley-Day]: Secondary | ICD-10-CM

## 2014-02-16 NOTE — Telephone Encounter (Signed)
Pt calling requesting MRI results. Please advise °

## 2014-02-16 NOTE — Telephone Encounter (Signed)
I called the patient. By my reading, the MRI the brain shows very minimal white matter changes, primarily in the left brain. The patient does have a history of hypertension and dyslipidemia. I think that her primary diagnosis would be chronic POTS, but I will check further blood work looking for other issues that may result in dysautonomia. The patient will come in for blood work. The future, and other medication such as Florinef may be helpful.

## 2014-02-23 ENCOUNTER — Telehealth: Payer: Self-pay | Admitting: *Deleted

## 2014-02-23 NOTE — Telephone Encounter (Signed)
Patient calling to get blood work results.

## 2014-02-24 NOTE — Telephone Encounter (Signed)
I called patient. The blood work so far with was normal with exception that the chromogranulin a level was slightly elevated. This was in low titer. We may check a 24 urine study for 5HIAA levels and metanephrines. First, I would like to try a low dose of Florinef to see if this alleviates her symptoms. If she is amenable to this, she is to contact our office. I believe that the patient has a chronic form of POTS.

## 2014-03-03 ENCOUNTER — Telehealth: Payer: Self-pay | Admitting: Neurology

## 2014-03-03 ENCOUNTER — Other Ambulatory Visit (HOSPITAL_BASED_OUTPATIENT_CLINIC_OR_DEPARTMENT_OTHER): Payer: Medicare Other

## 2014-03-03 ENCOUNTER — Ambulatory Visit (HOSPITAL_BASED_OUTPATIENT_CLINIC_OR_DEPARTMENT_OTHER): Payer: Medicare Other | Admitting: Oncology

## 2014-03-03 VITALS — BP 126/80 | HR 115 | Temp 97.8°F | Resp 18 | Ht 62.5 in | Wt 234.9 lb

## 2014-03-03 DIAGNOSIS — I2699 Other pulmonary embolism without acute cor pulmonale: Secondary | ICD-10-CM

## 2014-03-03 DIAGNOSIS — C8298 Follicular lymphoma, unspecified, lymph nodes of multiple sites: Secondary | ICD-10-CM | POA: Diagnosis not present

## 2014-03-03 DIAGNOSIS — Z452 Encounter for adjustment and management of vascular access device: Secondary | ICD-10-CM

## 2014-03-03 DIAGNOSIS — C8589 Other specified types of non-Hodgkin lymphoma, extranodal and solid organ sites: Secondary | ICD-10-CM

## 2014-03-03 DIAGNOSIS — Z853 Personal history of malignant neoplasm of breast: Secondary | ICD-10-CM | POA: Diagnosis not present

## 2014-03-03 DIAGNOSIS — Z7901 Long term (current) use of anticoagulants: Secondary | ICD-10-CM | POA: Diagnosis not present

## 2014-03-03 LAB — IFE AND PE, SERUM
Albumin SerPl Elph-Mcnc: 3.3 g/dL (ref 3.2–5.6)
Albumin/Glob SerPl: 1.3 (ref 0.7–2.0)
Alpha 1: 0.2 g/dL (ref 0.1–0.4)
Alpha2 Glob SerPl Elph-Mcnc: 0.8 g/dL (ref 0.4–1.2)
B-Globulin SerPl Elph-Mcnc: 1 g/dL (ref 0.6–1.3)
Gamma Glob SerPl Elph-Mcnc: 0.6 g/dL (ref 0.5–1.6)
Globulin, Total: 2.7 g/dL (ref 2.0–4.5)
IGM (IMMUNOGLOBULIN M), SRM: 71 mg/dL (ref 40–230)
IgA/Immunoglobulin A, Serum: 127 mg/dL (ref 91–414)
IgG (Immunoglobin G), Serum: 856 mg/dL (ref 700–1600)
Total Protein: 6 g/dL (ref 6.0–8.5)

## 2014-03-03 LAB — CBC WITH DIFFERENTIAL/PLATELET
BASO%: 0.6 % (ref 0.0–2.0)
Basophils Absolute: 0 10*3/uL (ref 0.0–0.1)
EOS%: 0.7 % (ref 0.0–7.0)
Eosinophils Absolute: 0 10*3/uL (ref 0.0–0.5)
HCT: 37.9 % (ref 34.8–46.6)
HGB: 12.7 g/dL (ref 11.6–15.9)
LYMPH%: 47.9 % (ref 14.0–49.7)
MCH: 30.8 pg (ref 25.1–34.0)
MCHC: 33.5 g/dL (ref 31.5–36.0)
MCV: 91.9 fL (ref 79.5–101.0)
MONO#: 0.5 10*3/uL (ref 0.1–0.9)
MONO%: 16.2 % — AB (ref 0.0–14.0)
NEUT#: 1.2 10*3/uL — ABNORMAL LOW (ref 1.5–6.5)
NEUT%: 34.6 % — ABNORMAL LOW (ref 38.4–76.8)
PLATELETS: 181 10*3/uL (ref 145–400)
RBC: 4.12 10*6/uL (ref 3.70–5.45)
RDW: 16 % — ABNORMAL HIGH (ref 11.2–14.5)
WBC: 3.3 10*3/uL — AB (ref 3.9–10.3)
lymph#: 1.6 10*3/uL (ref 0.9–3.3)

## 2014-03-03 LAB — ANA W/REFLEX: Anti Nuclear Antibody(ANA): NEGATIVE

## 2014-03-03 LAB — SEDIMENTATION RATE: Sed Rate: 16 mm/hr (ref 0–40)

## 2014-03-03 LAB — RHEUMATOID FACTOR: Rhuematoid fact SerPl-aCnc: 11.9 IU/mL (ref 0.0–13.9)

## 2014-03-03 LAB — VGCC ANTIBODY: VGCC Antibody: NEGATIVE

## 2014-03-03 LAB — PARANEOPLASTIC PROFILE 1
Neuronal Nuclear (Hu) Antibody (IB): <1:10 {titer}
Purkinje Cell (Yo) Autoantobodies- IFA: <1:10 {titer}

## 2014-03-03 LAB — LYME, TOTAL AB TEST/REFLEX: Lyme IgG/IgM Ab: <0.91 {ISR} (ref 0.00–0.90)

## 2014-03-03 LAB — ANGIOTENSIN CONVERTING ENZYME: Angio Convert Enzyme: 61 U/L (ref 14–82)

## 2014-03-03 LAB — GLIADIN IGG/IGA AB PROF, EIA
ANTIGLIADIN ABS, IGA: 3 U (ref 0–19)
Gliadin IgG: 2 units (ref 0–19)

## 2014-03-03 LAB — SJOGREN'S SYNDROME ANTIBODS(SSA + SSB)
ENA SSA (RO) Ab: <0.2 AI (ref 0.0–0.9)
ENA SSB (LA) Ab: <0.2 AI (ref 0.0–0.9)

## 2014-03-03 LAB — CHROMOGRANIN A: Chromogranin A: 8 nmol/L — ABNORMAL HIGH (ref 0–5)

## 2014-03-03 MED ORDER — LORAZEPAM 0.5 MG PO TABS: 0.5000 mg | ORAL_TABLET | Freq: Two times a day (BID) | ORAL | Status: DC | PRN

## 2014-03-03 MED ORDER — SODIUM CHLORIDE 0.9 % IJ SOLN
10.0000 mL | INTRAMUSCULAR | Status: DC | PRN
  Administered 2014-03-03: 10 mL via INTRAVENOUS
  Filled 2014-03-03: qty 10

## 2014-03-03 MED ORDER — RIVAROXABAN 20 MG PO TABS: 20.0000 mg | ORAL_TABLET | Freq: Every day | ORAL | Status: DC

## 2014-03-03 MED ORDER — HEPARIN SOD (PORK) LOCK FLUSH 100 UNIT/ML IV SOLN
500.0000 [IU] | Freq: Once | INTRAVENOUS | Status: AC
  Administered 2014-03-03: 500 [IU] via INTRAVENOUS
  Filled 2014-03-03: qty 5

## 2014-03-03 NOTE — Patient Instructions (Signed)

## 2014-03-03 NOTE — Progress Notes (Signed)
  [place] OFFICE PROGRESS NOTE   Diagnosis: Non-Hodgkin's lymphoma  INTERVAL HISTORY:   Crystal [name] completed another cycle of bendamustine/Rituxan 01/27/2014. She feels well. Stable exertional dyspnea. No palpable lymph nodes. She continues anticoagulation.  Objective:  Vital signs in last 24 hours:  Blood pressure 126/80, pulse 115, temperature 97.8 F (36.6 C), temperature source Oral, resp. rate 18, height 5' 2.5" (1.588 m), weight 234 lb 14.4 oz (106.55 kg).    HEENT: No thrush or ulcers, neck without mass Lymphatics: No cervical, supra-clavicular, axillary, or inguinal nodes Resp: Lungs with inspiratory rhonchi at the right greater than left base, no respiratory distress Cardio: Tachycardia, regular rate and rhythm GI: No hepatosplenomegaly Vascular: No leg edema  Portacath/PICC-without erythema  Lab Results:  Lab Results  Component Value Date   WBC 3.3* 03/03/2014   HGB 12.7 03/03/2014   HCT 37.9 03/03/2014   MCV 91.9 03/03/2014   PLT 181 03/03/2014   NEUTROABS 1.2* 03/03/2014    Medications: I have reviewed the patient's current medications.  Assessment/Plan: 1. Low-grade follicular lymphoma involving a right parotid mass, status post an excisional biopsy on 11/28/2010. a. Staging CT scans 01/03/2011 confirmed an increased number of small nodes in the neck, left axilla and pelvis without clear evidence of pathologic lymphadenopathy. b. PET scan 01/11/2011 confirmed hypermetabolic lymph nodes in the right cervical chain, left axillary nodes, periaortic, common iliac, external iliac and inguinal nodes. There was also a possible area of involvement at the right tonsillar region. c. Palpable left posterior cervical nodes confirmed on exam 05/15/2013- progressive left neck nodes on exam 08/18/2013. d. Status post cycle 1 bendamustine/Rituxan beginning 08/28/2013. e. Near-complete resolution of left neck adenopathy on exam 09/12/2013. f. Status post cycle 2  bendamustine/Rituxan beginning 09/25/2013. g. CT abdomen/pelvis 10/01/2013-near-complete response to therapy with isolated borderline enlarged left iliac node measuring 1.37 m. Previously identified right peritoneal right pelvic sidewall adenopathy is resolved. h. Cycle 3 bendamustine/Rituxan beginning 10/28/2013. i. Cycle 4 bendamustine/Rituxan beginning 11/25/2013. j. Cycle 5 of bendamustine/Rituxan beginning 12/24/2013. k. Cycle 6 bendamustine/rituximab 01/27/2014 2. Stage I right-sided breast cancer diagnosed in 1998. 3. History of congestive heart failure. 4. Hypertension. 5. Port-A-Cath placement 08/25/2013 in interventional radiology. 6. Chills during the Rituxan infusion 08/28/2013. She was given Solu-Medrol. Rituxan was resumed and completed. 7. Abdominal pain following cycle 2 bendamustine/Rituxan-no explanation for the pain on a CT 10/01/2013, resolved after starting Protonix. 8. Tachycardia 12/23/2013. Chest CT showed a pulmonary embolus. 9. Chest CT 12/23/2013. Small nonocclusive right lower lobe pulmonary embolus. Minimal thrombus burden. No other emboli demonstrated. Xarelto initiated.right upper extremity and bilateral lower extremity Dopplers negative on 12/25/2013.    Disposition:  Crystal [name] has completed 6 cycles of bendamustine/Rituxan. She is in clinical remission from non-Hodgkin's lymphoma. The plan is to complete one more month of xarelto anticoagulation. We will then arrange for removal of the Port-A-Cath.  Crystal [name] will return for an office visit in one month.  Ladell Pier, MD  03/03/2014  4:12 PM

## 2014-03-03 NOTE — Telephone Encounter (Signed)
All of the blood work results are written, only abnormality is a very minimal elevation in the chromogranulin A level.

## 2014-03-12 ENCOUNTER — Encounter: Payer: Self-pay | Admitting: Cardiology

## 2014-03-12 ENCOUNTER — Ambulatory Visit (INDEPENDENT_AMBULATORY_CARE_PROVIDER_SITE_OTHER): Payer: Medicare Other | Admitting: Cardiology

## 2014-03-12 VITALS — BP 106/68 | HR 108 | Ht 63.0 in | Wt 230.9 lb

## 2014-03-12 DIAGNOSIS — E785 Hyperlipidemia, unspecified: Secondary | ICD-10-CM | POA: Diagnosis not present

## 2014-03-12 DIAGNOSIS — R0609 Other forms of dyspnea: Secondary | ICD-10-CM | POA: Diagnosis not present

## 2014-03-12 DIAGNOSIS — I10 Essential (primary) hypertension: Secondary | ICD-10-CM | POA: Diagnosis not present

## 2014-03-12 DIAGNOSIS — R0989 Other specified symptoms and signs involving the circulatory and respiratory systems: Secondary | ICD-10-CM

## 2014-03-12 DIAGNOSIS — I2699 Other pulmonary embolism without acute cor pulmonale: Secondary | ICD-10-CM

## 2014-03-12 DIAGNOSIS — R0600 Dyspnea, unspecified: Secondary | ICD-10-CM

## 2014-03-12 DIAGNOSIS — I428 Other cardiomyopathies: Secondary | ICD-10-CM | POA: Diagnosis not present

## 2014-03-12 NOTE — Patient Instructions (Signed)
Your physician has requested that you have an echocardiogram. Echocardiography is a painless test that uses sound waves to create images of your heart. It provides your doctor with information about the size and shape of your heart and how well your heart's chambers and valves are working. This procedure takes approximately one hour. There are no restrictions for this procedure.  Your physician has requested that you have a lexiscan myoview. For further information please visit [email or web address]. Please follow instruction sheet, as given.  Your physician wants you to follow-up in 6-8 weeks with Dr Ellyn Hack - go test results.  You will receive a reminder letter in the mail two months in advance. If you don't receive a letter, please call our office to schedule the follow-up appointment.

## 2014-03-14 DIAGNOSIS — R05 Cough: Secondary | ICD-10-CM | POA: Diagnosis not present

## 2014-03-14 DIAGNOSIS — R059 Cough, unspecified: Secondary | ICD-10-CM | POA: Diagnosis not present

## 2014-03-16 DIAGNOSIS — I2699 Other pulmonary embolism without acute cor pulmonale: Secondary | ICD-10-CM | POA: Insufficient documentation

## 2014-03-16 NOTE — Assessment & Plan Note (Signed)
Defer to PCP. Not currently on therapy.

## 2014-03-16 NOTE — Assessment & Plan Note (Signed)
When last checked her EF was essentially back to normal, she's been on chemotherapy again. A repeat echocardiogram will reassess to see if there's been any residual damage.

## 2014-03-16 NOTE — Assessment & Plan Note (Signed)
Decided that he must not be that significant digital admitted 3 months of Xarelto. Hopefully a 2-D echocardiogram will show [place] RV size and pressures.

## 2014-03-16 NOTE — Assessment & Plan Note (Addendum)
Probably multifactorial, ensure that the PE is playing a part. I am concerned that could be some possible cardiac etiology though because she's been having worsening symptoms of dyspnea. Plan: 2-D echocardiogram to reassess EF and pulmonary pressures; likely LexiScan Myoview to assess for coronary ischemia -- with her hip pain, I don't think she could walk on treadmill

## 2014-03-16 NOTE — Progress Notes (Signed)
Patient ID: Crystal [name], female   DOB: 05-Aug-1946, 68 y.o.   MRN: [id number]  PCP: Simona Huh, MD  Clinic Note: Chief Complaint  Patient presents with  . 6 MONTH VISIT    HAD COUGH FOR WEEK NO CONGESTION, NO CHEST PAIN , SOB ,NO EDEMA, completed chemo - develop PE IN April 2015- started  Gwenyth Bouillon, leg tremors   HPI: Crystal [name] is a 68 y.o. female with a PMH notable for doxorubicin-induced nonischemic cardiomyopathy while being treated for breast cancer in 2009. She was a patient of Dr. Aldona Bar, whom I taken over as the primary cardiologist. Her initial EF is roughly 40-45%, with no coronary disease on catheterization. Followup echocardiography revealed essentially resolved cardiomyopathy with EF of 50-55%. She has now completed chemotherapy for Non-Hodgkin's Lymphoma (began in November).    Interval History: Since I last saw her. Her chemotherapy only to develop PE in April. She is currently on Xarelto which the plan is for total of 3 months. Besides that, she has been sick for last week or 2 with a very bad cold and congestion. She been using [employer] but not all that helpful. Since her PE, she still notes that she gets a little short of breath walking around the house it has little swelling at the end of the day when sitting up a lot. Before she got sick to she's been doing pretty well.  When not feeling sick, she does note occasional shortness of breath if she pushes up with activity. Nothing overtly abnormal. She denies any PND, orthopnea with maybe some mild lower extremity edema. She seemed to be getting over the fatigue from her chemotherapy but is still not back to her normal self.  She denies any chest tightness or pressure with exertion but still has intermittent sensation of tightness in her chest at rest. She denies any palpitations or rapid heart beats. No syncope or near-syncope. No TIA/amaurosis fugax as symptoms. No melena, hematochezia or hematuria. No  claudication symptoms.  Past Medical History  Diagnosis Date  . Hypertension   . Nonischemic cardiomyopathy     ? Doxorubincin induced; essentially resolved as of echo in January 2014, current EF 50-55%. Grade 1 diastolic dysfunction.  . Dyslipidemia   . Anxiety   . Severe obesity (BMI >= 40) 05/21/2013  . Lymphoma     lymphoma dx 11/28/10 - left neck  . non hodgkins lymphoma 12/2010  . Breast cancer 1998    (Rt) lumpectomy dx 1999; Dr. Benay Spice  . Obesity    Prior Cardiac Evaluation and Past Surgical History: Past Surgical History  Procedure Laterality Date  . Abdominal hysterectomy    . Breast lumpectomy    . Cardiac catheterization  March 2009     None coronary disease, EF 45% (up from nuclear study EF of 30% in June '08)  . Doppler echocardiography  January 2014    EF 50-55%, no regional wall motion and melena. Grade 1 diastolic dysfunction, abnormal relaxation. Normal pressures. Mild aortic sclerosis, no stenosis.  . Portacath placement  August 25, 2013    rt. with tip in cavoatrial junction, Dr.Yamagata   . Colonoscopy     Allergies  Allergen Reactions  . Codeine Other (See Comments)    Bad Headaches  . Coreg Other (See Comments)    "Made my legs hurt"  . Lisinopril Cough    Current Outpatient Prescriptions  Medication Sig Dispense Refill  . aspirin EC 81 MG tablet Take 81 mg  by mouth every morning.      . benzonatate (TESSALON) 200 MG capsule       . chlorzoxazone (PARAFON) 500 MG tablet Take 500 mg by mouth 4 (four) times daily as needed.      Marland Kitchen Dextromethorphan-Guaifenesin 20-400 MG TABS Take by mouth as needed.      . dicyclomine (BENTYL) 20 MG tablet Take 40 mg by mouth 2 (two) times daily.       . diphenhydramine-acetaminophen (TYLENOL PM) 25-500 MG TABS Take 1 tablet by mouth at bedtime as needed.      . Ibuprofen-Diphenhydramine Cit (ADVIL PM) 200-38 MG TABS Take 2 tablets by mouth at bedtime.      . lidocaine-prilocaine (EMLA) cream Apply 1 application  topically daily as needed (For port-a-cath.).      Marland Kitchen LORazepam (ATIVAN) 0.5 MG tablet Take 1 tablet (0.5 mg total) by mouth 2 (two) times daily as needed.  60 tablet  0  . losartan-hydrochlorothiazide (HYZAAR) 50-12.5 MG per tablet Take 1 tablet by mouth every morning.       . metoprolol (LOPRESSOR) 50 MG tablet TAKE 1 TABLET TWICE DAILY  180 tablet  3  . pantoprazole (PROTONIX) 40 MG tablet TAKE 1 TABLET BY MOUTH ONCE DAILY  30 tablet  1  . rivaroxaban (XARELTO) 20 MG TABS tablet Take 1 tablet (20 mg total) by mouth daily with supper.  30 tablet  0   No current facility-administered medications for this visit.   Social History Narrative   She is a widow mother of 2, with 1 child is deceased. She has 2 grandchildren. She is trying to get some walking in town and can do about 20-25 minutes his another 30 minutes she pushes a more night gets short of breath.   She is at retired [employer], he simply laid off after 45 years.   She usually comes accompanied by her sister, who is also my patient.   She never was a smoker, and only occasionally has a social alcoholic beverage.   ROS: A comprehensive Review of Systems - Negative except pertinent positives noted above. Noncardiac symptoms noted below.she just notes generalized fatigue and body aches as well as mild dizziness and wooziness on her chemotherapy and day 1 postoperative days. Hematological and Lymphatic ROS: positive for - swollen lymph nodes --already down since initial round of chemotherapy negative for - bleeding problems, blood clots, blood transfusions, bruising or night sweats Gastrointestinal ROS: positive for - gas/bloating, heartburn and Indigestion Musculoskeletal ROS: Bilateral hip discomfort with walking.  PHYSICAL EXAM BP 106/68  Pulse 108  Ht 5\' 3"  (1.6 m)  Wt 230 lb 14.4 oz (104.736 kg)  BMI 40.91 kg/m2 heart rate on and the exam was 94  General appearance: alert, cooperative, appears stated age, no distress,  morbidly obese and Otherwise healthy-appearing. Well-groomed. Answers questions appropriately. Neck: moderate anterior cervical adenopathy, no carotid bruit, no JVD and supple, symmetrical, trachea midline Lungs: Essentially CTAB, mild basal crackles. Otherwise nonlabored normal percussion, good air movement. Heart: RRR, S1, S2 normal, no murmur, click, rub or gallop and normal apical impulse Abdomen: soft, non-tender; bowel sounds normal; no masses,  no organomegaly and Significant truncal obesity Extremities: edema Trace,, varicose veins noted, but not engorged -- more like spider veins Pulses: 2+ and symmetric Neurologic: Grossly normal  AYT:KZSWFUXNA today: Yes Rate: 108 , Rhythm: Sinus tachycardia, nonspecific ST-T changes.  Recent Labs: None available.  ASSESSMENT: I may concern with her exertional dyspnea and fatigue. This can  walk around the house is probably more than she normally had before. Says she has had chemotherapy and will check another echocardiogram but also check a stress test as well to confirm no active cardiac etiology.  Dyspnea on exertion Probably multifactorial, ensure that the PE is playing a part. I am concerned that could be some possible cardiac etiology though because she's been having worsening symptoms of dyspnea. Plan: 2-D echocardiogram to reassess EF and pulmonary pressures; likely LexiScan Myoview to assess for coronary ischemia -- with her hip pain, I don't think she could walk on treadmill  Pulmonary embolus Decided that he must not be that significant digital admitted 3 months of Xarelto. Hopefully a 2-D echocardiogram will show [place] RV size and pressures.  Nonischemic cardiomyopathy When last checked her EF was essentially back to normal, she's been on chemotherapy again. A repeat echocardiogram will reassess to see if there's been any residual damage.  Severe obesity (BMI >= 40) 11 Detrol getting her weight down. Hopefully now that her treatments  are over she can get back to exercising. Her hip really has been bothering her. Hopefully be noted to convince her that she is okay to exercise, a stress test will show his any sign of coronary ischemia. This should give her reassurance to get back to exercising.  Dyslipidemia Defer to PCP. Not currently on therapy.   PLAN: Per problem list. Orders Placed This Encounter  Procedures  . Myocardial Perfusion Imaging    Standing Status: Future     Number of Occurrences:      Standing Expiration Date: 03/12/2015    Order Specific Question:  Where should this test be performed    Answer:  [facility]    Order Specific Question:  Type of stress    Answer:  Lexiscan    Order Specific Question:  Patient weight in lbs    Answer:  230  . EKG 12-Lead  . 2D Echocardiogram without contrast    Standing Status: Future     Number of Occurrences:      Standing Expiration Date: 03/12/2015    Order Specific Question:  Type of Echo    Answer:  Complete    Order Specific Question:  Where should this test be performed    Answer:  [facility]    Order Specific Question:  Reason for exam-Echo    Answer:  Cardiomyopathy-Unspecified  425.9   Followup: 6-8 weeks  Leonie Man, M.D., M.S. Interventional Cardiologist   Pager # [phone] 03/16/2014

## 2014-03-16 NOTE — Assessment & Plan Note (Signed)
11 Detrol getting her weight down. Hopefully now that her treatments are over she can get back to exercising. Her hip really has been bothering her. Hopefully be noted to convince her that she is okay to exercise, a stress test will show his any sign of coronary ischemia. This should give her reassurance to get back to exercising.

## 2014-03-23 DIAGNOSIS — B9789 Other viral agents as the cause of diseases classified elsewhere: Secondary | ICD-10-CM | POA: Diagnosis not present

## 2014-03-23 DIAGNOSIS — R059 Cough, unspecified: Secondary | ICD-10-CM | POA: Diagnosis not present

## 2014-03-23 DIAGNOSIS — R05 Cough: Secondary | ICD-10-CM | POA: Diagnosis not present

## 2014-03-25 ENCOUNTER — Ambulatory Visit (HOSPITAL_BASED_OUTPATIENT_CLINIC_OR_DEPARTMENT_OTHER): Payer: Medicare Other | Admitting: Nurse Practitioner

## 2014-03-25 ENCOUNTER — Other Ambulatory Visit (HOSPITAL_BASED_OUTPATIENT_CLINIC_OR_DEPARTMENT_OTHER): Payer: Medicare Other

## 2014-03-25 ENCOUNTER — Telehealth: Payer: Self-pay | Admitting: Oncology

## 2014-03-25 ENCOUNTER — Telehealth (HOSPITAL_COMMUNITY): Payer: Self-pay

## 2014-03-25 VITALS — BP 120/73 | HR 93 | Temp 97.4°F | Resp 20 | Ht 63.0 in | Wt 229.6 lb

## 2014-03-25 DIAGNOSIS — C8589 Other specified types of non-Hodgkin lymphoma, extranodal and solid organ sites: Secondary | ICD-10-CM

## 2014-03-25 LAB — CBC WITH DIFFERENTIAL/PLATELET
BASO%: 0.2 % (ref 0.0–2.0)
Basophils Absolute: 0 10*3/uL (ref 0.0–0.1)
EOS%: 0 % (ref 0.0–7.0)
Eosinophils Absolute: 0 10*3/uL (ref 0.0–0.5)
HCT: 34.7 % — ABNORMAL LOW (ref 34.8–46.6)
HGB: 11.4 g/dL — ABNORMAL LOW (ref 11.6–15.9)
LYMPH%: 13 % — AB (ref 14.0–49.7)
MCH: 30.6 pg (ref 25.1–34.0)
MCHC: 32.9 g/dL (ref 31.5–36.0)
MCV: 93.3 fL (ref 79.5–101.0)
MONO#: 0.4 10*3/uL (ref 0.1–0.9)
MONO%: 7.2 % (ref 0.0–14.0)
NEUT#: 4.1 10*3/uL (ref 1.5–6.5)
NEUT%: 79.6 % — ABNORMAL HIGH (ref 38.4–76.8)
PLATELETS: 327 10*3/uL (ref 145–400)
RBC: 3.72 10*6/uL (ref 3.70–5.45)
RDW: 15.1 % — ABNORMAL HIGH (ref 11.2–14.5)
WBC: 5.2 10*3/uL (ref 3.9–10.3)
lymph#: 0.7 10*3/uL — ABNORMAL LOW (ref 0.9–3.3)
nRBC: 0 % (ref 0–0)

## 2014-03-25 MED ORDER — HYDROCODONE-HOMATROPINE 5-1.5 MG/5ML PO SYRP: 5.0000 mL | ORAL_SOLUTION | Freq: Four times a day (QID) | ORAL | Status: DC | PRN

## 2014-03-25 MED ORDER — PANTOPRAZOLE SODIUM 40 MG PO TBEC: 40.0000 mg | DELAYED_RELEASE_TABLET | Freq: Every day | ORAL | Status: DC

## 2014-03-25 NOTE — Telephone Encounter (Signed)
gv pt appt schedule for sept and app w/IR for port removal 6/16 @ 2:30pm to arrive @ 12:30pm. s/w Tiffany in IR. pt given appt d/t/location/instructions and is aware of arrival time.

## 2014-03-25 NOTE — Progress Notes (Signed)
Crystal [name] OFFICE PROGRESS NOTE   Diagnosis:  Non-Hodgkin's lymphoma.  INTERVAL HISTORY:   Crystal [name] returns as scheduled. She reports a recent diagnosis of "bronchitis". She has completed a Z-Pak and is currently taking prednisone. She has a persistent cough. She initially had a low-grade fever. She notes periodic sweats as well. She has stable dyspnea on exertion. She reports cardiac evaluation is in progress. She has a good appetite. She denies pain.  Objective:  Vital signs in last 24 hours:  Blood pressure 120/73, pulse 93, temperature 97.4 F (36.3 C), temperature source Oral, resp. rate 20, height 5\' 3"  (1.6 m), weight 229 lb 9.6 oz (104.146 kg), SpO2 100.00%.    HEENT: No thrush or ulcerations. Lymphatics: No palpable cervical, supraclavicular, axillary or inguinal lymph nodes. Resp: Rales at the right lung base. No respiratory distress. Cardio: Regular cardiac rhythm. GI: Soft and nontender. No organomegaly. No mass. Vascular: No leg edema.  Port-A-Cath site without erythema.   Lab Results:  Lab Results  Component Value Date   WBC 5.2 03/25/2014   HGB 11.4* 03/25/2014   HCT 34.7* 03/25/2014   MCV 93.3 03/25/2014   PLT 327 03/25/2014   NEUTROABS 4.1 03/25/2014    Imaging:  No results found.  Medications: I have reviewed the patient's current medications.  Assessment/Plan: 1. Low-grade follicular lymphoma involving a right parotid mass, status post an excisional biopsy on 11/28/2010. a. Staging CT scans 01/03/2011 confirmed an increased number of small nodes in the neck, left axilla and pelvis without clear evidence of pathologic lymphadenopathy. b. PET scan 01/11/2011 confirmed hypermetabolic lymph nodes in the right cervical chain, left axillary nodes, periaortic, common iliac, external iliac and inguinal nodes. There was also a possible area of involvement at the right tonsillar region. c. Palpable left posterior cervical nodes confirmed on exam  05/15/2013- progressive left neck nodes on exam 08/18/2013. d. Status post cycle 1 bendamustine/Rituxan beginning 08/28/2013. e. Near-complete resolution of left neck adenopathy on exam 09/12/2013. f. Status post cycle 2 bendamustine/Rituxan beginning 09/25/2013. g. CT abdomen/pelvis 10/01/2013-near-complete response to therapy with isolated borderline enlarged left iliac node measuring 1.37 m. Previously identified right peritoneal right pelvic sidewall adenopathy is resolved. h. Cycle 3 bendamustine/Rituxan beginning 10/28/2013. i. Cycle 4 bendamustine/Rituxan beginning 11/25/2013. j. Cycle 5 of bendamustine/Rituxan beginning 12/24/2013. k. Cycle 6 bendamustine/rituximab 01/27/2014. 2. Stage I right-sided breast cancer diagnosed in 1998. 3. History of congestive heart failure. 4. Hypertension. 5. Port-A-Cath placement 08/25/2013 in interventional radiology. 6. Chills during the Rituxan infusion 08/28/2013. She was given Solu-Medrol. Rituxan was resumed and completed. 7. Abdominal pain following cycle 2 bendamustine/Rituxan-no explanation for the pain on a CT 10/01/2013, resolved after starting Protonix. 8. Tachycardia 12/23/2013. Chest CT showed a pulmonary embolus. 9. Chest CT 12/23/2013. Small nonocclusive right lower lobe pulmonary embolus. Minimal thrombus burden. No other emboli demonstrated. Xarelto initiated. Right upper extremity and bilateral lower extremity Dopplers negative on 12/25/2013. 10. "Bronchitis". She is currently completing a course of prednisone.   Disposition: She remains in clinical remission from non-Hodgkin's lymphoma.  She will finish the current Xarelto prescription (6 days) to complete 3 months of anticoagulation and then discontinue.  We are referring her for Port-A-Cath removal the week of 04/06/2014.  She will return for a followup visit in 3 months. She will contact the office prior to that visit with any problems.    Plan reviewed with Dr.  Benay Spice.  Owens [place]ark ANP/GNP-BC   03/25/2014  9:39 AM

## 2014-03-26 ENCOUNTER — Telehealth (HOSPITAL_COMMUNITY): Payer: Self-pay

## 2014-03-31 ENCOUNTER — Encounter (HOSPITAL_COMMUNITY): Payer: Medicare Other

## 2014-03-31 ENCOUNTER — Ambulatory Visit (HOSPITAL_COMMUNITY): Payer: Medicare Other

## 2014-04-04 DIAGNOSIS — J069 Acute upper respiratory infection, unspecified: Secondary | ICD-10-CM | POA: Diagnosis not present

## 2014-04-07 ENCOUNTER — Other Ambulatory Visit (HOSPITAL_COMMUNITY): Payer: Medicare Other

## 2014-04-07 ENCOUNTER — Ambulatory Visit (HOSPITAL_COMMUNITY): Payer: Medicare Other

## 2014-04-10 ENCOUNTER — Telehealth: Payer: Self-pay | Admitting: Medical Oncology

## 2014-04-10 ENCOUNTER — Telehealth: Payer: Self-pay | Admitting: Oncology

## 2014-04-10 NOTE — Telephone Encounter (Signed)
per pof to call pt to give flush appt-cld & spoke w.pt and gave time & date-pt understood

## 2014-04-10 NOTE — Telephone Encounter (Signed)
Persistent coughing . She was seen in urgent clinic and by Dr Marisue Humble last week for same. During our phone conversation she coughed non stop even when she was trying to talk . She started crying and is worried that her lymphoma is back. She asked to see Dr Benay Spice next week.  I instructed her to contact Dr Marisue Humble or urgent clinic now to f/u her symptom and told her I will notify dr Benay Spice and his nurse of her concerns. I also told her to call back with an update after she sees Dr Marisue Humble.  Onc Tx request sent for port flush.

## 2014-04-13 DIAGNOSIS — J209 Acute bronchitis, unspecified: Secondary | ICD-10-CM | POA: Diagnosis not present

## 2014-04-14 ENCOUNTER — Encounter (HOSPITAL_COMMUNITY): Payer: Medicare Other

## 2014-04-14 ENCOUNTER — Telehealth: Payer: Self-pay | Admitting: *Deleted

## 2014-04-14 ENCOUNTER — Ambulatory Visit (HOSPITAL_COMMUNITY): Payer: Medicare Other

## 2014-04-14 NOTE — Telephone Encounter (Signed)
Patient update; spoke with pt--she saw Dr. Alroy Dust 04/13/14 and dx her with acute bronchitis; taking Doxycycline twice a day.  Pt states "this is the 3rd antibiotic I've been on since May"  Pt reports she is feeling better after starting med 04/13/14 and verbalized understanding to call office if problems.  Pt expressed appreciation for call.

## 2014-04-15 ENCOUNTER — Other Ambulatory Visit (HOSPITAL_COMMUNITY): Payer: Medicare Other

## 2014-04-15 ENCOUNTER — Ambulatory Visit (HOSPITAL_COMMUNITY): Payer: Medicare Other

## 2014-04-16 ENCOUNTER — Telehealth: Payer: Self-pay | Admitting: *Deleted

## 2014-04-16 NOTE — Telephone Encounter (Signed)
Close encounter 

## 2014-04-20 ENCOUNTER — Other Ambulatory Visit: Payer: Self-pay | Admitting: Radiology

## 2014-04-20 ENCOUNTER — Encounter (HOSPITAL_COMMUNITY): Payer: Self-pay | Admitting: Pharmacy Technician

## 2014-04-22 ENCOUNTER — Encounter (HOSPITAL_COMMUNITY): Payer: Self-pay

## 2014-04-22 ENCOUNTER — Ambulatory Visit (HOSPITAL_COMMUNITY)
Admission: RE | Admit: 2014-04-22 | Discharge: 2014-04-22 | Disposition: A | Discharge status: Home / Self Care | Payer: Medicare Other | Source: Ambulatory Visit | Attending: Nurse Practitioner | Admitting: Nurse Practitioner

## 2014-04-22 ENCOUNTER — Encounter: Payer: Self-pay | Admitting: *Deleted

## 2014-04-22 ENCOUNTER — Ambulatory Visit (HOSPITAL_COMMUNITY)
Admission: RE | Admit: 2014-04-22 | Discharge: 2014-04-22 | Disposition: A | Discharge status: Home / Self Care | Payer: Medicare Other | Source: Ambulatory Visit | Attending: Oncology | Admitting: Oncology

## 2014-04-22 DIAGNOSIS — Z853 Personal history of malignant neoplasm of breast: Secondary | ICD-10-CM | POA: Insufficient documentation

## 2014-04-22 DIAGNOSIS — I428 Other cardiomyopathies: Secondary | ICD-10-CM | POA: Diagnosis not present

## 2014-04-22 DIAGNOSIS — Z79899 Other long term (current) drug therapy: Secondary | ICD-10-CM | POA: Insufficient documentation

## 2014-04-22 DIAGNOSIS — F411 Generalized anxiety disorder: Secondary | ICD-10-CM | POA: Diagnosis not present

## 2014-04-22 DIAGNOSIS — E785 Hyperlipidemia, unspecified: Secondary | ICD-10-CM | POA: Diagnosis not present

## 2014-04-22 DIAGNOSIS — C8589 Other specified types of non-Hodgkin lymphoma, extranodal and solid organ sites: Secondary | ICD-10-CM | POA: Insufficient documentation

## 2014-04-22 DIAGNOSIS — I10 Essential (primary) hypertension: Secondary | ICD-10-CM | POA: Insufficient documentation

## 2014-04-22 DIAGNOSIS — Z7982 Long term (current) use of aspirin: Secondary | ICD-10-CM | POA: Insufficient documentation

## 2014-04-22 DIAGNOSIS — Z452 Encounter for adjustment and management of vascular access device: Secondary | ICD-10-CM | POA: Insufficient documentation

## 2014-04-22 LAB — PROTIME-INR
INR: 0.98 (ref 0.00–1.49)
Prothrombin Time: 13 seconds (ref 11.6–15.2)

## 2014-04-22 LAB — CBC
HEMATOCRIT: 37.9 % (ref 36.0–46.0)
HEMOGLOBIN: 12.7 g/dL (ref 12.0–15.0)
MCH: 30.4 pg (ref 26.0–34.0)
MCHC: 33.5 g/dL (ref 30.0–36.0)
MCV: 90.7 fL (ref 78.0–100.0)
Platelets: 228 10*3/uL (ref 150–400)
RBC: 4.18 MIL/uL (ref 3.87–5.11)
RDW: 14.9 % (ref 11.5–15.5)
WBC: 4.2 10*3/uL (ref 4.0–10.5)

## 2014-04-22 MED ORDER — SODIUM CHLORIDE 0.9 % IV SOLN
INTRAVENOUS | Status: DC
  Administered 2014-04-22: 12:00:00 via INTRAVENOUS

## 2014-04-22 MED ORDER — LIDOCAINE HCL 1 % IJ SOLN
INTRAMUSCULAR | Status: AC
  Filled 2014-04-22: qty 20

## 2014-04-22 MED ORDER — CEFAZOLIN SODIUM-DEXTROSE 2-3 GM-% IV SOLR
INTRAVENOUS | Status: AC
  Filled 2014-04-22: qty 50

## 2014-04-22 MED ORDER — MIDAZOLAM HCL 2 MG/2ML IJ SOLN
INTRAMUSCULAR | Status: AC | PRN
  Administered 2014-04-22 (×3): 1 mg via INTRAVENOUS

## 2014-04-22 MED ORDER — CEFAZOLIN SODIUM-DEXTROSE 2-3 GM-% IV SOLR
2.0000 g | Freq: Once | INTRAVENOUS | Status: AC
Start: 2014-04-22 — End: 2014-04-22
  Administered 2014-04-22: 2 g via INTRAVENOUS

## 2014-04-22 MED ORDER — FENTANYL CITRATE 0.05 MG/ML IJ SOLN
INTRAMUSCULAR | Status: AC
  Filled 2014-04-22: qty 6

## 2014-04-22 MED ORDER — FENTANYL CITRATE 0.05 MG/ML IJ SOLN
INTRAMUSCULAR | Status: AC | PRN
  Administered 2014-04-22 (×3): 50 ug via INTRAVENOUS

## 2014-04-22 MED ORDER — MIDAZOLAM HCL 2 MG/2ML IJ SOLN
INTRAMUSCULAR | Status: AC
  Filled 2014-04-22: qty 6

## 2014-04-22 NOTE — H&P (Signed)
Crystal [name] is an 68 y.o. female.   Chief Complaint: "I'm going to get my port a cath taken out" HPI: Patient with history of NHL, currently in clinical remission, presents today for port a cath removal.  Past Medical History  Diagnosis Date  . Hypertension   . Nonischemic cardiomyopathy     ? Doxorubincin induced; essentially resolved as of echo in January 2014, current EF 50-55%. Grade 1 diastolic dysfunction.  . Dyslipidemia   . Anxiety   . Severe obesity (BMI >= 40) 05/21/2013  . Lymphoma     lymphoma dx 11/28/10 - left neck  . non hodgkins lymphoma 12/2010  . Breast cancer 1998    (Rt) lumpectomy dx 1999; Dr. Benay Spice  . Obesity     Past Surgical History  Procedure Laterality Date  . Abdominal hysterectomy    . Breast lumpectomy    . Cardiac catheterization  March 2009     None coronary disease, EF 45% (up from nuclear study EF of 30% in June '08)  . Doppler echocardiography  January 2014    EF 50-55%, no regional wall motion and melena. Grade 1 diastolic dysfunction, abnormal relaxation. Normal pressures. Mild aortic sclerosis, no stenosis.  . Portacath placement  August 25, 2013    rt. with tip in cavoatrial junction, Dr.Yamagata   . Colonoscopy      Family History  Problem Relation Age of Onset  . Heart Problems Mother     CABG  . Heart Problems Father   . Heart Problems Maternal Grandmother   . Stroke Maternal Grandfather   . Cancer Paternal Grandmother     stomach  . Heart Problems Paternal Grandfather   . Hypertension Brother     and lupus  . Heart attack Sister   . Hypertension Sister     x2   Social History:  reports that she has never smoked. She has never used smokeless tobacco. She reports that she drinks alcohol. She reports that she does not use illicit drugs.  Allergies:  Allergies  Allergen Reactions  . Codeine Other (See Comments)    Bad Headaches  . Coreg Other (See Comments)    "Made my legs hurt"  . Lisinopril Cough    Current  outpatient prescriptions:aspirin EC 81 MG tablet, Take 81 mg by mouth every morning., Disp: , Rfl: ;  chlorzoxazone (PARAFON) 500 MG tablet, Take 500 mg by mouth 4 (four) times daily as needed for muscle spasms., Disp: , Rfl: ;  dicyclomine (BENTYL) 20 MG tablet, Take 40 mg by mouth 2 (two) times daily. , Disp: , Rfl: ;  Ibuprofen-Diphenhydramine Cit (ADVIL PM) 200-38 MG TABS, Take 2 tablets by mouth at bedtime., Disp: , Rfl:  LORazepam (ATIVAN) 0.5 MG tablet, Take 0.5 mg by mouth 2 (two) times daily as needed for anxiety., Disp: , Rfl: ;  losartan-hydrochlorothiazide (HYZAAR) 50-12.5 MG per tablet, Take 1 tablet by mouth every morning. , Disp: , Rfl: ;  metoprolol (LOPRESSOR) 50 MG tablet, Take 50 mg by mouth 2 (two) times daily., Disp: , Rfl: ;  pantoprazole (PROTONIX) 40 MG tablet, Take 40 mg by mouth daily., Disp: , Rfl:  HYDROcodone-homatropine (HYCODAN) 5-1.5 MG/5ML syrup, Take 5 mLs by mouth every 6 (six) hours as needed for cough., Disp: 120 mL, Rfl: 0 Current facility-administered medications:0.9 %  sodium chloride infusion, , Intravenous, Continuous, Hedy Jacob, PA-C, Last Rate: 50 mL/hr at 04/22/14 1200;  ceFAZolin (ANCEF) IVPB 2 g/50 mL premix, 2 g, Intravenous, Once, [employer]  Lorri Frederick, PA-C   Results for orders placed during the hospital encounter of 04/22/14 (from the past 48 hour(s))  CBC     Status: None   Collection Time    04/22/14 11:50 AM      Result Value Ref Range   WBC 4.2  4.0 - 10.5 K/uL   RBC 4.18  3.87 - 5.11 MIL/uL   Hemoglobin 12.7  12.0 - 15.0 g/dL   HCT 37.9  36.0 - 46.0 %   MCV 90.7  78.0 - 100.0 fL   MCH 30.4  26.0 - 34.0 pg   MCHC 33.5  30.0 - 36.0 g/dL   RDW 14.9  11.5 - 15.5 %   Platelets 228  150 - 400 K/uL  PROTIME-INR     Status: None   Collection Time    04/22/14 11:50 AM      Result Value Ref Range   Prothrombin Time 13.0  11.6 - 15.2 seconds   INR 0.98  0.00 - 1.49   No results found.  Review of Systems  Constitutional: Negative for fever and  chills.  Respiratory: Negative for shortness of breath.        Occ cough  Cardiovascular: Negative for chest pain.  Gastrointestinal: Negative for nausea, vomiting and abdominal pain.  Musculoskeletal: Negative for back pain.  Neurological: Negative for headaches.  Endo/Heme/Allergies: Does not bruise/bleed easily.  Psychiatric/Behavioral: The patient is nervous/anxious.     Blood pressure 113/72, pulse 98, temperature 98 F (36.7 C), temperature source Oral, resp. rate 18, SpO2 94.00%. Physical Exam  Constitutional: She is oriented to person, place, and time. She appears well-developed and well-nourished.  Cardiovascular: Normal rate and regular rhythm.   Respiratory: Effort normal.  Dim BS bases, few rales rt base  GI: Soft.  Obese; mild gen tenderness  Musculoskeletal: Normal range of motion. She exhibits no edema.  Neurological: She is alert and oriented to person, place, and time.     Assessment/Plan Patient with history of NHL, currently in clinical remission, presents today for port a cath removal. Details/risks of procedure d/w pt with her understanding and consent.   Crystal [name],D KEVIN 04/22/2014, 12:54 PM

## 2014-04-22 NOTE — Discharge Instructions (Signed)
Incision Care An incision is when a surgeon cuts into your body tissues. After surgery, the incision needs to be cared for properly to prevent infection.  HOME CARE INSTRUCTIONS   Take all medicine as directed by your caregiver. Only take over-the-counter or prescription medicines for pain, discomfort, or fever as directed by your caregiver.  Do not remove your bandage (dressing) or get your incision wet until your surgeon gives you permission. In the event that your dressing becomes wet, dirty, or starts to smell, change the dressing and call your surgeon for instructions as soon as possible.  Take showers. Do not take tub baths, swim, or do anything that may soak the wound until it is healed.  Resume your normal diet and activities as directed or allowed.  Avoid lifting any weight until you are instructed otherwise.  Use anti-itch antihistamine medicine as directed by your caregiver. The wound may itch when it is healing. Do not pick or scratch at the wound.  Follow up with your caregiver for stitch (suture) or staple removal as directed.  Drink enough fluids to keep your urine clear or pale yellow. SEEK MEDICAL CARE IF:   You have redness, swelling, or increasing pain in the wound that is not controlled with medicine.  You have drainage, blood, or pus coming from the wound that lasts longer than 1 day.  You develop muscle aches, chills, or a general ill feeling.  You notice a bad smell coming from the wound or dressing.  Your wound edges separate after the sutures, staples, or skin adhesive strips have been removed.  You develop persistent nausea or vomiting. SEEK IMMEDIATE MEDICAL CARE IF:   You have a fever.  You develop a rash.  You develop dizzy episodes or faint while standing.  You have difficulty breathing.  You develop any reaction or side effects to medicine given. MAKE SURE YOU:   Understand these instructions.  Will watch your condition.  Will get help  right away if you are not doing well or get worse. Document Released: 04/28/2005 Document Revised: 01/01/2012 Document Reviewed: 02/12/2011 [facility] Patient Information 2015 [place], [place]. This information is not intended to replace advice given to you by your health care provider. Make sure you discuss any questions you have with your health care provider. Conscious Sedation Sedation is the use of medicines to promote relaxation and relieve discomfort and anxiety. Conscious sedation is a type of sedation. Under conscious sedation you are less alert than normal but are still able to respond to instructions or stimulation. Conscious sedation is used during short medical and dental procedures. It is milder than deep sedation or general anesthesia and allows you to return to your regular activities sooner.  LET [facility] CARE PROVIDER KNOW ABOUT:   Any allergies you have.  All medicines you are taking, including vitamins, herbs, eye drops, creams, and over-the-counter medicines.  Use of steroids (by mouth or creams).  Previous problems you or members of your family have had with the use of anesthetics.  Any blood disorders you have.  Previous surgeries you have had.  Medical conditions you have.  Possibility of pregnancy, if this applies.  Use of cigarettes, alcohol, or illegal drugs. RISKS AND COMPLICATIONS Generally, this is a safe procedure. However, as with any procedure, problems can occur. Possible problems include:  Oversedation.  Trouble breathing on your own. You may need to have a breathing tube until you are awake and breathing on your own.  Allergic reaction to any of  the medicines used for the procedure. °BEFORE THE PROCEDURE °· You may have blood tests done. These tests can help show how well your kidneys and liver are working. They can also show how well your blood clots. °· A physical exam will be done.   °· Only take medicines as directed by your health care provider.  You may need to stop taking medicines (such as blood thinners, aspirin, or nonsteroidal anti-inflammatory drugs) before the procedure.   °· Do not eat or drink at least 6 hours before the procedure or as directed by your health care provider. °· Arrange for a responsible adult, family member, or friend to take you home after the procedure. He or she should stay with you for at least 24 hours after the procedure, until the medicine has worn off. °PROCEDURE  °· An intravenous (IV) catheter will be inserted into one of your veins. Medicine will be able to flow directly into your body through this catheter. You may be given medicine through this tube to help prevent pain and help you relax. °· The medical or dental procedure will be done. °AFTER THE PROCEDURE °· You will stay in a recovery area until the medicine has worn off. Your blood pressure and pulse will be checked.   °·  Depending on the procedure you had, you may be allowed to go home when you can tolerate liquids and your pain is under control. °Document Released: 07/04/2001 Document Revised: 10/14/2013 Document Reviewed: 06/16/2013 °[employer]® Patient Information ©2015 [employer]. This information is not intended to replace advice given to you by your health care provider. Make sure you discuss any questions you have with your health care provider. ° °

## 2014-04-22 NOTE — Telephone Encounter (Signed)
Encounter complete. 

## 2014-04-22 NOTE — Procedures (Signed)
Successful removal of the right chest port.  No immediate complication.

## 2014-04-27 ENCOUNTER — Other Ambulatory Visit: Payer: Self-pay | Admitting: Nurse Practitioner

## 2014-04-27 DIAGNOSIS — R3915 Urgency of urination: Secondary | ICD-10-CM | POA: Diagnosis not present

## 2014-04-30 ENCOUNTER — Ambulatory Visit (HOSPITAL_COMMUNITY): Payer: Medicare Other

## 2014-04-30 NOTE — Telephone Encounter (Signed)
Encounter complete. 

## 2014-05-01 ENCOUNTER — Ambulatory Visit (HOSPITAL_COMMUNITY)
Admission: RE | Admit: 2014-05-01 | Discharge: 2014-05-01 | Disposition: A | Discharge status: Home / Self Care | Payer: Medicare Other | Source: Ambulatory Visit | Attending: Cardiovascular Disease | Admitting: Cardiovascular Disease

## 2014-05-01 ENCOUNTER — Ambulatory Visit (HOSPITAL_BASED_OUTPATIENT_CLINIC_OR_DEPARTMENT_OTHER)
Admission: RE | Admit: 2014-05-01 | Discharge: 2014-05-01 | Disposition: A | Discharge status: Home / Self Care | Payer: Medicare Other | Source: Ambulatory Visit | Attending: Cardiovascular Disease | Admitting: Cardiovascular Disease

## 2014-05-01 DIAGNOSIS — R0600 Dyspnea, unspecified: Secondary | ICD-10-CM

## 2014-05-01 DIAGNOSIS — R0989 Other specified symptoms and signs involving the circulatory and respiratory systems: Secondary | ICD-10-CM | POA: Insufficient documentation

## 2014-05-01 DIAGNOSIS — I519 Heart disease, unspecified: Secondary | ICD-10-CM

## 2014-05-01 DIAGNOSIS — I10 Essential (primary) hypertension: Secondary | ICD-10-CM | POA: Diagnosis not present

## 2014-05-01 DIAGNOSIS — E785 Hyperlipidemia, unspecified: Secondary | ICD-10-CM

## 2014-05-01 DIAGNOSIS — R0609 Other forms of dyspnea: Secondary | ICD-10-CM

## 2014-05-01 DIAGNOSIS — I2699 Other pulmonary embolism without acute cor pulmonale: Secondary | ICD-10-CM | POA: Diagnosis not present

## 2014-05-01 DIAGNOSIS — I428 Other cardiomyopathies: Secondary | ICD-10-CM

## 2014-05-01 HISTORY — PX: NM MYOVIEW LTD: HXRAD82

## 2014-05-01 HISTORY — PX: TRANSTHORACIC ECHOCARDIOGRAM: SHX275

## 2014-05-01 MED ORDER — REGADENOSON 0.4 MG/5ML IV SOLN
0.4000 mg | Freq: Once | INTRAVENOUS | Status: AC
  Administered 2014-05-01: 0.4 mg via INTRAVENOUS

## 2014-05-01 MED ORDER — TECHNETIUM TC 99M SESTAMIBI GENERIC - CARDIOLITE
30.1000 | Freq: Once | INTRAVENOUS | Status: AC | PRN
  Administered 2014-05-01: 30.1 via INTRAVENOUS

## 2014-05-01 MED ORDER — AMINOPHYLLINE 25 MG/ML IV SOLN
150.0000 mg | Freq: Once | INTRAVENOUS | Status: AC
  Administered 2014-05-01: 150 mg via INTRAVENOUS

## 2014-05-01 MED ORDER — TECHNETIUM TC 99M SESTAMIBI GENERIC - CARDIOLITE
10.5000 | Freq: Once | INTRAVENOUS | Status: AC | PRN
  Administered 2014-05-01: 11 via INTRAVENOUS

## 2014-05-01 NOTE — Procedures (Addendum)
[facility] [address] [place] [address] [phone]  Cardiology Nuclear Med Study  Crystal [name] is a 68 y.o. female     MRN : [id number]     DOB: June 13, 1946  Procedure Date: 05/01/2014  Nuclear Med Background Indication for Stress Test:  Evaluation for Ischemia History:  MI-2005;Non ischemic cardiomyopathy;non-hodgkins lymphoma;Last NUC MPI on 03/29/2007-scar;-inferior, iferiolateral apical region periinfarct;EF=33%;ECHO 10/2012-EF=50-55% Cardiac Risk Factors: Family History - CAD, Hypertension, Lipids, Obesity and PE w/tachycardia  Symptoms:  Dizziness, DOE, Fatigue, Light-Headedness and Palpitations   Nuclear Pre-Procedure Caffeine/Decaff Intake:  7:00pm NPO After: 5:00am   IV Site: R Forearm  IV 0.9% NS with Angio Cath:  22g  Chest Size (in):  n/a IV Started by: Rolene Course, RN  Height: 5\' 3"  (1.6 m)  Cup Size: D;Pt is s/p right lumpectomy with lympectomy;no sticks right side.  BMI:  Body mass index is 40.75 kg/(m^2). Weight:  230 lb (104.327 kg)   Tech Comments:  NO sticks right arm.     Nuclear Med Study 1 or 2 day study: 1 day  Stress Test Type:  Anchor  Order Authorizing Provider:  Glenetta Hew, MD   Resting Radionuclide: Technetium 70m Sestamibi  Resting Radionuclide Dose: 10.5 mCi   Stress Radionuclide:  Technetium 40m Sestamibi  Stress Radionuclide Dose: 30.1 mCi           Stress Protocol Rest HR: 78 Stress HR: 95  Rest BP: 122/75 Stress BP:127/77  Exercise Time (min): n/a METS: n/a          Dose of Adenosine (mg):  n/a Dose of Lexiscan: 0.4 mg  Dose of Atropine (mg): n/a Dose of Dobutamine: n/a mcg/kg/min (at max HR)  Stress Test Technologist: Mellody Memos, CCT Nuclear Technologist: Imagene Riches, CNMT   Rest Procedure:  Myocardial perfusion imaging was performed at rest 45 minutes following the intravenous administration of Technetium 61m Sestamibi. Stress Procedure:  The patient  received IV Lexiscan 0.4 mg over 15-seconds.  Technetium 62m Sestamibi IV injected at 30-seconds.  Patient experienced, shortness of breath, drop in blood pressure, nausea and was administered 150 mg of Aminophylline IV. There were no significant changes with Lexiscan.  Quantitative spect images were obtained after a 45 minute delay.  Transient Ischemic Dilatation (Normal <1.22):  0.87  QGS EDV:  66 ml QGS ESV:  30 ml LV Ejection Fraction: 55%        Rest ECG: NSR - Normal EKG  Stress ECG: No significant change from baseline ECG  QPS Raw Data Images:  Normal; no motion artifact; normal heart/lung ratio. Stress Images:  Normal homogeneous uptake in all areas of the myocardium. Rest Images:  There is decreased uptake in the apex. Subtraction (SDS):  No evidence of ischemia.  Impression Exercise Capacity:  Lexiscan with no exercise. BP Response:  Hypotensive blood pressure response. Clinical Symptoms:  No significant symptoms noted. ECG Impression:  No significant ST segment change suggestive of ischemia. Comparison with Prior Nuclear Study: No significant change from previous study  Overall Impression:  Low risk stress nuclear study Apical thinning wothout ischemia.  LV Wall Motion:  NL LV Function; NL Wall Motion   Lorretta Harp, MD  05/01/2014 6:26 PM

## 2014-05-01 NOTE — Progress Notes (Signed)
2D Echocardiogram Complete.  05/01/2014   [place], [place]

## 2014-05-04 ENCOUNTER — Ambulatory Visit (INDEPENDENT_AMBULATORY_CARE_PROVIDER_SITE_OTHER): Payer: Medicare Other | Admitting: Cardiology

## 2014-05-04 ENCOUNTER — Encounter: Payer: Self-pay | Admitting: Cardiology

## 2014-05-04 VITALS — BP 118/68 | HR 85 | Ht 64.0 in | Wt 227.7 lb

## 2014-05-04 DIAGNOSIS — I428 Other cardiomyopathies: Secondary | ICD-10-CM

## 2014-05-04 DIAGNOSIS — R0989 Other specified symptoms and signs involving the circulatory and respiratory systems: Secondary | ICD-10-CM

## 2014-05-04 DIAGNOSIS — R0600 Dyspnea, unspecified: Secondary | ICD-10-CM

## 2014-05-04 DIAGNOSIS — E785 Hyperlipidemia, unspecified: Secondary | ICD-10-CM | POA: Diagnosis not present

## 2014-05-04 DIAGNOSIS — R0609 Other forms of dyspnea: Secondary | ICD-10-CM

## 2014-05-04 NOTE — Patient Instructions (Signed)
No change in current medication  Your physician wants you to follow-up in 12 month Dr Ellyn Hack.  You will receive a reminder letter in the mail two months in advance. If you don't receive a letter, please call our office to schedule the follow-up appointment.

## 2014-05-05 NOTE — Progress Notes (Signed)
Quick Note:  Stress Test looked good!! No sign of significant Heart Artery Disease. Pump function is normal.  Good news!!.  Leonie Man, MD  ______

## 2014-05-09 ENCOUNTER — Encounter: Payer: Self-pay | Admitting: Cardiology

## 2014-05-09 NOTE — Assessment & Plan Note (Signed)
Stable low normal EF with no significant valvular lesions.

## 2014-05-09 NOTE — Assessment & Plan Note (Addendum)
Again this was definitely multifactorial but probably at our last visit was mostly related to her operation for tract infection. Since her symptoms have improved, I am less impressed with this being a possible cardiac etiology, especially in the setting of a normal echocardiogram and Myoview.

## 2014-05-09 NOTE — Assessment & Plan Note (Signed)
She has started to lose weight very slowly. Her weight in May was 230 pounds. For now as she is had a relatively normal cardiac evaluation, she'll feel more comfortable giving back into exercise.

## 2014-05-09 NOTE — Progress Notes (Signed)
PCP: Simona Huh, MD  Clinic Note: Chief Complaint  Patient presents with  . Follow-up    test results    HPI: Crystal [name] is a 68 y.o. female with a Cardiovascular Problem List below who presents today for followup of recent studies. I saw her in May for six-month followup. She has a history of chemotherapy-induced nonischemic cardiomyopathy in the past with a reduced ejection fraction. She is now yet again undergoing treatment for cancer. Apparently she just finished her chemotherapy roughly the last visit she had with me.  When I saw her, she was noting exertional dyspnea and fatigue, although this is in the setting of what seemed like a peripheral vitrectomy infection. Just to clarify this he has not had any worsening of her EF we did a 2-D echocardiogram was relatively stable if not improved as noted below. We also checked a left skin Myoview to confirm that there was no evidence of ischemia. This did not show any ischemia or infarction.  Interval History: She presents today having cleared her upper respiratory tract illness. No more coughing or wheezing or low-grade fevers.  She was diagnosed by a neurologist with POTS syndrome that worse causing her leg tremors. She was put on a medication for that but never really started because she was not convinced it was in help. Her exertional dyspnea has definitely improved. No PND, orthopnea with mild intermittent edema.  Cardiovascular ROS: no chest pain or dyspnea on exertion positive for - Mild edema and shortness of breath she really pushes it. Still battling to get back to her level of activity from prior to cancer therapy. negative for - irregular heartbeat, loss of consciousness, murmur, orthopnea, palpitations, paroxysmal nocturnal dyspnea, rapid heart rate, shortness of breath or TIA/amaurosis fugax, syncope/near syncope, melena, hematochezia, hematuria, epistaxis; claudication:  Past Medical History  Diagnosis Date  .  Essential hypertension   . Nonischemic cardiomyopathy 2008    ? Doxorubincin induced; essentially resolved as of echo in January 2014, current EF 50-55%. Grade 1 diastolic dysfunction.  . Dyslipidemia, goal LDL below 130   . Anxiety   . Severe obesity (BMI >= 40) 05/21/2013    Improve to BMI of 39 by July 2015  . Lymphoma     lymphoma dx 11/28/10 - left neck  . non hodgkins lymphoma 12/2010  . Breast cancer 1998    (Rt) lumpectomy dx 1999; Dr. Benay Spice  . Obesity     Prior Cardiac Evaluation and Past Surgical History: Past Surgical History  Procedure Laterality Date  . Abdominal hysterectomy    . Breast lumpectomy    . Cardiac catheterization  March 2009     None coronary disease, EF 45% (up from nuclear study EF of 30% in June '08)  . Doppler echocardiography  January 2014    EF 50-55%, no regional wall motion and melena. Grade 1 diastolic dysfunction, abnormal relaxation. Normal pressures. Mild aortic sclerosis, no stenosis.  . Portacath placement  August 25, 2013    rt. with tip in cavoatrial junction, Dr.Yamagata   . Colonoscopy    . Nm myoview ltd  03/29/2007    EF 33%,NEGATIVE ISCHEMIA, prob need cath  . Transthoracic echocardiogram  05/01/2014    Normal LV size with low normal function. EF of 50-55% and Gr 1 DD; aortic sclerosis without stenosis. MAC and thickening/calcification of the anterior leaflet - no notable AI / AS or MR/MS  . Nm myoview ltd  05/01/2014    Lexiscan: EF 55%. Normal  wall motion; no ischemia or infarction; apical thinning    MEDICATIONS AND ALLERGIES REVIEWED IN EPIC No Change in Social and Family History  ROS: A comprehensive Review of Systems - Negative except symptoms noted in history of present illness  Wt Readings from Last 3 Encounters:  05/04/14 227 lb 11.2 oz (103.284 kg)  05/01/14 230 lb (104.327 kg)  03/25/14 229 lb 9.6 oz (104.146 kg)   PHYSICAL EXAM BP 118/68  Pulse 85  Ht 5\' 4"  (1.626 m)  Wt 227 lb 11.2 oz (103.284 kg)  BMI  39.07 kg/m2 General appearance: alert, cooperative, appears stated age, no distress, morbidly obese and Otherwise healthy-appearing. Well-groomed. Answers questions appropriately.  Neck: moderate anterior cervical adenopathy, no carotid bruit, no JVD and supple, symmetrical, trachea midline  Lungs: CTAB, non-labored normal percussion, good air movement.  Heart: RRR, S1, S2 normal, no murmur, click, rub or gallop and normal apical impulse  Abdomen: soft, non-tender; bowel sounds normal; no masses, no organomegaly and Significant truncal obesity  Extremities: edema Trace,, varicose veins noted, but not engorged -- more like spider veins  Pulses: 2+ and symmetric  Neurologic: Grossly normal   Adult ECG Report  Rate: 85 ;  Rhythm: normal sinus rhythm (borderline short PR interval), Non-specific ST-T wave changes -- stable EKG.  Recent Labs since May:   03/25/2014: CBC - W5 0.2, H./H. 11.4/34.7,Plt 327 -- 4.2; 12.7/37.9; 228  ASSESSMENT / PLAN: Notable improvement of symptoms after clearing her operation for tract infection. Echocardiogram looks relatively stable. There is suggestion of possibly mildly reduced EF compared to the last echo. I think this is simply in the eyes of the beholder as it is not very appreciable.  Nonischemic cardiomyopathy Stable low normal EF with no significant valvular lesions.  Dyspnea on exertion Again this was definitely multifactorial but probably at our last visit was mostly related to her operation for tract infection. Since her symptoms have improved, I am less impressed with this being a possible cardiac etiology, especially in the setting of a normal echocardiogram and Myoview.  Dyslipidemia, goal LDL below 130 Not currently on therapy. Monitored by PCP. Major risk factors and her weight, but nonischemic Myoview would mean that her target LDL is less than 130  Severe obesity (BMI >= 40) She has started to lose weight very slowly. Her weight in May was 230  pounds. For now as she is had a relatively normal cardiac evaluation, she'll feel more comfortable giving back into exercise.    Orders Placed This Encounter  Procedures  . EKG 12-Lead   No orders of the defined types were placed in this encounter.    Followup: 12 months    HARDING,DAVID W, M.D., M.S. Interventional Cardiologist   Pager # [phone]

## 2014-05-09 NOTE — Assessment & Plan Note (Signed)
Not currently on therapy. Monitored by PCP. Major risk factors and her weight, but nonischemic Myoview would mean that her target LDL is less than 130

## 2014-05-20 ENCOUNTER — Telehealth: Payer: Self-pay | Admitting: Cardiology

## 2014-05-20 NOTE — Telephone Encounter (Signed)
Spoke with pt, she has developed a dry cough. She has been on the losartan for quit sometime and is unsure if related to the med or of related to the previous problems she has had with her throat. Explained to pt it is unlikely related to the losartan. She was encouraged to give dr Benay Spice a call, she has a follow up with him in sept. Encouraged pt to call their office, if they feel not related to the chemo she has had in the past to let us know. Pt agreed with this plan.

## 2014-05-20 NOTE — Telephone Encounter (Signed)
Pt wants to know if she can change her blood pressure medicine? It is making her have a terrible cough.

## 2014-05-21 ENCOUNTER — Telehealth: Payer: Self-pay | Admitting: *Deleted

## 2014-05-21 NOTE — Telephone Encounter (Signed)
Received message from pt stating "I've developed a dry cough; is this to be expected?"  Attempted to call pt; no answer; left message to call office.

## 2014-05-22 ENCOUNTER — Telehealth: Payer: Self-pay | Admitting: *Deleted

## 2014-05-22 ENCOUNTER — Ambulatory Visit (HOSPITAL_COMMUNITY)
Admission: RE | Admit: 2014-05-22 | Discharge: 2014-05-22 | Disposition: A | Discharge status: Home / Self Care | Payer: Medicare Other | Source: Ambulatory Visit | Attending: Oncology | Admitting: Oncology

## 2014-05-22 DIAGNOSIS — R059 Cough, unspecified: Secondary | ICD-10-CM | POA: Insufficient documentation

## 2014-05-22 DIAGNOSIS — Z87898 Personal history of other specified conditions: Secondary | ICD-10-CM | POA: Insufficient documentation

## 2014-05-22 DIAGNOSIS — R05 Cough: Secondary | ICD-10-CM | POA: Insufficient documentation

## 2014-05-22 DIAGNOSIS — C8589 Other specified types of non-Hodgkin lymphoma, extranodal and solid organ sites: Secondary | ICD-10-CM

## 2014-05-22 NOTE — Telephone Encounter (Signed)
Reports development of dry, persistent cough and asking if it could be related to her Treanda or Rituxan? Denies any fever or dyspnea out of her baseline. Dr. Benay Spice is doubtful of this, but suggests CXR and if negative can refer her to pulmonary physician. She agrees to get CXR today.

## 2014-05-22 NOTE — Telephone Encounter (Signed)
Notified that CXR was normal. Suggested she try over the weekend taking an OTC allergy med like Claritin or Sudafed to see if this helps. Will make MD aware of report on Monday.

## 2014-05-25 ENCOUNTER — Telehealth: Payer: Self-pay | Admitting: *Deleted

## 2014-05-25 NOTE — Telephone Encounter (Signed)
Dr. Benay Spice reviewed chest XRay report. Recommends pt see PCP if cough persists. Pt voiced understanding. Stated she tried OTC cold and sinus tabs which seems to help. She understands to F/U here as scheduled.

## 2014-06-05 ENCOUNTER — Telehealth: Payer: Self-pay | Admitting: Cardiology

## 2014-06-05 MED ORDER — METOPROLOL TARTRATE 50 MG PO TABS: 50.0000 mg | ORAL_TABLET | Freq: Two times a day (BID) | ORAL | Status: DC

## 2014-06-05 NOTE — Telephone Encounter (Signed)
Pt called in stating that she is out of her Metoprolol 50mg  and like it refilled. She also stated that they are sent through mail order. Please call  Thanks

## 2014-06-05 NOTE — Telephone Encounter (Signed)
Patient Rx sent into [facility]. Patient states her PCP sent the Rx into mail order but it has not arrived yet, so she wanted [place] to send in 1 month to [facility]. Informed patient one month was sent in but if PCP is filling the 90 day supply he should also fill 30 day supply. Patient voiced understanding.

## 2014-06-30 ENCOUNTER — Telehealth: Payer: Self-pay | Admitting: Oncology

## 2014-06-30 ENCOUNTER — Other Ambulatory Visit (HOSPITAL_BASED_OUTPATIENT_CLINIC_OR_DEPARTMENT_OTHER): Payer: Medicare Other

## 2014-06-30 ENCOUNTER — Ambulatory Visit (HOSPITAL_BASED_OUTPATIENT_CLINIC_OR_DEPARTMENT_OTHER): Payer: Medicare Other | Admitting: Oncology

## 2014-06-30 ENCOUNTER — Other Ambulatory Visit: Payer: Self-pay | Admitting: Oncology

## 2014-06-30 VITALS — BP 115/87 | HR 110 | Temp 98.1°F | Resp 20 | Ht 64.0 in | Wt 230.3 lb

## 2014-06-30 DIAGNOSIS — I2699 Other pulmonary embolism without acute cor pulmonale: Secondary | ICD-10-CM

## 2014-06-30 DIAGNOSIS — C8588 Other specified types of non-Hodgkin lymphoma, lymph nodes of multiple sites: Secondary | ICD-10-CM

## 2014-06-30 DIAGNOSIS — Z23 Encounter for immunization: Secondary | ICD-10-CM

## 2014-06-30 DIAGNOSIS — C8589 Other specified types of non-Hodgkin lymphoma, extranodal and solid organ sites: Secondary | ICD-10-CM

## 2014-06-30 DIAGNOSIS — I10 Essential (primary) hypertension: Secondary | ICD-10-CM

## 2014-06-30 LAB — CBC WITH DIFFERENTIAL/PLATELET
BASO%: 0.6 % (ref 0.0–2.0)
BASOS ABS: 0 10*3/uL (ref 0.0–0.1)
EOS%: 0 % (ref 0.0–7.0)
Eosinophils Absolute: 0 10*3/uL (ref 0.0–0.5)
HEMATOCRIT: 40.3 % (ref 34.8–46.6)
HEMOGLOBIN: 13.5 g/dL (ref 11.6–15.9)
LYMPH#: 1.2 10*3/uL (ref 0.9–3.3)
LYMPH%: 35.2 % (ref 14.0–49.7)
MCH: 30.3 pg (ref 25.1–34.0)
MCHC: 33.5 g/dL (ref 31.5–36.0)
MCV: 90.6 fL (ref 79.5–101.0)
MONO#: 0.3 10*3/uL (ref 0.1–0.9)
MONO%: 9.6 % (ref 0.0–14.0)
NEUT#: 1.9 10*3/uL (ref 1.5–6.5)
NEUT%: 54.6 % (ref 38.4–76.8)
Platelets: 230 10*3/uL (ref 145–400)
RBC: 4.45 10*6/uL (ref 3.70–5.45)
RDW: 14.2 % (ref 11.2–14.5)
WBC: 3.4 10*3/uL — ABNORMAL LOW (ref 3.9–10.3)

## 2014-06-30 MED ORDER — INFLUENZA VAC SPLIT QUAD 0.5 ML IM SUSY
0.5000 mL | PREFILLED_SYRINGE | Freq: Once | INTRAMUSCULAR | Status: AC
  Administered 2014-06-30: 0.5 mL via INTRAMUSCULAR
  Filled 2014-06-30: qty 0.5

## 2014-06-30 MED ORDER — PANTOPRAZOLE SODIUM 40 MG PO TBEC: 40.0000 mg | DELAYED_RELEASE_TABLET | Freq: Every day | ORAL | Status: DC

## 2014-06-30 NOTE — Telephone Encounter (Signed)
gv pt appt schedule for jan 2016 °

## 2014-06-30 NOTE — Progress Notes (Signed)
  [facility] OFFICE PROGRESS NOTE   Diagnosis: Non-Hodgkin's lymphoma  INTERVAL HISTORY:   Crystal [name] returns as scheduled. She feels well. She had bronchitis a few months ago and this resolved. No palpable lymph nodes. No fever or anorexia. She has hot flashes.  Objective:  Vital signs in last 24 hours:  Blood pressure 115/87, pulse 110, temperature 98.1 F (36.7 C), temperature source Oral, resp. rate 20, height 5\' 4"  (1.626 m), weight 230 lb 4.8 oz (104.463 kg), SpO2 98.00%.    HEENT: Neck without mass Lymphatics: No cervical, supra-clavicular, axillary, or inguinal nodes Resp: Lungs with end inspiratory rhonchi at the bases bilaterally, no respiratory distress Cardio: Regular rate and rhythm, tachycardia GI: No hepatosplenomegaly, nontender, no mass Vascular: No leg edema Breast: 1/2 cm firm nodular area at the central aspect of the right lumpectomy scar (she reports this is a chronic finding)    Lab Results:  Lab Results  Component Value Date   WBC 3.4* 06/30/2014   HGB 13.5 06/30/2014   HCT 40.3 06/30/2014   MCV 90.6 06/30/2014   PLT 230 06/30/2014   NEUTROABS 1.9 06/30/2014      Medications: I have reviewed the patient's current medications.  Assessment/Plan: 1. Low-grade follicular lymphoma involving a right parotid mass, status post an excisional biopsy on 11/28/2010. a. Staging CT scans 01/03/2011 confirmed an increased number of small nodes in the neck, left axilla and pelvis without clear evidence of pathologic lymphadenopathy. b. PET scan 01/11/2011 confirmed hypermetabolic lymph nodes in the right cervical chain, left axillary nodes, periaortic, common iliac, external iliac and inguinal nodes. There was also a possible area of involvement at the right tonsillar region. c. Palpable left posterior cervical nodes confirmed on exam 05/15/2013- progressive left neck nodes on exam 08/18/2013. d. Status post cycle 1 bendamustine/Rituxan beginning  08/28/2013. e. Near-complete resolution of left neck adenopathy on exam 09/12/2013. f. Status post cycle 2 bendamustine/Rituxan beginning 09/25/2013. g. CT abdomen/pelvis 10/01/2013-near-complete response to therapy with isolated borderline enlarged left iliac node measuring 1.37 m. Previously identified right peritoneal right pelvic sidewall adenopathy is resolved. h. Cycle 3 bendamustine/Rituxan beginning 10/28/2013. i. Cycle 4 bendamustine/Rituxan beginning 11/25/2013. j. Cycle 5 of bendamustine/Rituxan beginning 12/24/2013. k. Cycle 6 bendamustine/rituximab 01/27/2014. 2. Stage I right-sided breast cancer diagnosed in 1998. 3. History of congestive heart failure. 4. Hypertension. 5. Port-A-Cath placement 08/25/2013 in interventional radiology. Removed 04/22/2014. 6. Chills during the Rituxan infusion 08/28/2013. She was given Solu-Medrol. Rituxan was resumed and completed. 7. Abdominal pain following cycle 2 bendamustine/Rituxan-no explanation for the pain on a CT 10/01/2013, resolved after starting Protonix. 8. Tachycardia 12/23/2013. Chest CT showed a pulmonary embolus. She completed 3 months of anticoagulation. 9. Chest CT 12/23/2013. Small nonocclusive right lower lobe pulmonary embolus. Minimal thrombus burden. No other emboli demonstrated. Xarelto initiated. Right upper extremity and bilateral lower extremity Dopplers negative on 12/25/2013.   Disposition:  Crystal [name] remains in clinical remission from non-Hodgkin's lymphoma. She will return for an office visit in 4 months. She received an influenza vaccine today. She will see Dr. Marisue Humble in October. She will be sure she is up-to-date on the pneumococcal vaccine.  [name], MD  06/30/2014  3:52 PM

## 2014-07-27 ENCOUNTER — Other Ambulatory Visit: Payer: Self-pay | Admitting: Oncology

## 2014-07-27 DIAGNOSIS — C8599 Non-Hodgkin lymphoma, unspecified, extranodal and solid organ sites: Secondary | ICD-10-CM

## 2014-07-28 ENCOUNTER — Other Ambulatory Visit: Payer: Self-pay | Admitting: Oncology

## 2014-07-29 ENCOUNTER — Telehealth: Payer: Self-pay | Admitting: Oncology

## 2014-07-29 NOTE — Telephone Encounter (Signed)
Lvm advising appt chg from 10/27/14 to 11/19/14 @ 3.45pm due to md on pal. also mailed revised appt calendar.

## 2014-08-07 ENCOUNTER — Other Ambulatory Visit: Payer: Self-pay

## 2014-08-18 ENCOUNTER — Ambulatory Visit: Payer: Medicare Other | Admitting: Neurology

## 2014-09-10 DIAGNOSIS — Z23 Encounter for immunization: Secondary | ICD-10-CM | POA: Diagnosis not present

## 2014-09-10 DIAGNOSIS — K589 Irritable bowel syndrome without diarrhea: Secondary | ICD-10-CM | POA: Diagnosis not present

## 2014-09-10 DIAGNOSIS — N183 Chronic kidney disease, stage 3 (moderate): Secondary | ICD-10-CM | POA: Diagnosis not present

## 2014-09-10 DIAGNOSIS — C8590 Non-Hodgkin lymphoma, unspecified, unspecified site: Secondary | ICD-10-CM | POA: Diagnosis not present

## 2014-09-10 DIAGNOSIS — I129 Hypertensive chronic kidney disease with stage 1 through stage 4 chronic kidney disease, or unspecified chronic kidney disease: Secondary | ICD-10-CM | POA: Diagnosis not present

## 2014-09-10 DIAGNOSIS — E780 Pure hypercholesterolemia: Secondary | ICD-10-CM | POA: Diagnosis not present

## 2014-09-10 DIAGNOSIS — G4700 Insomnia, unspecified: Secondary | ICD-10-CM | POA: Diagnosis not present

## 2014-09-10 DIAGNOSIS — K219 Gastro-esophageal reflux disease without esophagitis: Secondary | ICD-10-CM | POA: Diagnosis not present

## 2014-09-10 DIAGNOSIS — F411 Generalized anxiety disorder: Secondary | ICD-10-CM | POA: Diagnosis not present

## 2014-09-11 ENCOUNTER — Encounter: Payer: Self-pay | Admitting: Cardiology

## 2014-09-24 ENCOUNTER — Other Ambulatory Visit: Payer: Self-pay

## 2014-09-24 DIAGNOSIS — Z1231 Encounter for screening mammogram for malignant neoplasm of breast: Secondary | ICD-10-CM

## 2014-10-04 DIAGNOSIS — R05 Cough: Secondary | ICD-10-CM | POA: Diagnosis not present

## 2014-10-04 DIAGNOSIS — J988 Other specified respiratory disorders: Secondary | ICD-10-CM | POA: Diagnosis not present

## 2014-10-12 DIAGNOSIS — B349 Viral infection, unspecified: Secondary | ICD-10-CM | POA: Diagnosis not present

## 2014-10-22 ENCOUNTER — Other Ambulatory Visit: Payer: Self-pay | Admitting: Cardiology

## 2014-10-22 NOTE — Telephone Encounter (Signed)
E sent to [facility]

## 2014-10-26 ENCOUNTER — Ambulatory Visit
Admission: RE | Admit: 2014-10-26 | Discharge: 2014-10-26 | Disposition: A | Discharge status: Home / Self Care | Payer: Medicare Other | Source: Ambulatory Visit

## 2014-10-26 ENCOUNTER — Ambulatory Visit: Payer: Medicare Other

## 2014-10-26 ENCOUNTER — Other Ambulatory Visit: Payer: Self-pay | Admitting: Family Medicine

## 2014-10-26 DIAGNOSIS — N63 Unspecified lump in unspecified breast: Secondary | ICD-10-CM

## 2014-10-26 DIAGNOSIS — Z1231 Encounter for screening mammogram for malignant neoplasm of breast: Secondary | ICD-10-CM

## 2014-10-27 ENCOUNTER — Ambulatory Visit: Payer: Medicare Other | Admitting: Oncology

## 2014-10-28 ENCOUNTER — Ambulatory Visit
Admission: RE | Admit: 2014-10-28 | Discharge: 2014-10-28 | Disposition: A | Discharge status: Home / Self Care | Payer: Medicare Other | Source: Ambulatory Visit | Attending: Family Medicine | Admitting: Family Medicine

## 2014-10-28 DIAGNOSIS — N63 Unspecified lump in unspecified breast: Secondary | ICD-10-CM

## 2014-10-28 DIAGNOSIS — N6489 Other specified disorders of breast: Secondary | ICD-10-CM | POA: Diagnosis not present

## 2014-11-19 ENCOUNTER — Telehealth: Payer: Self-pay | Admitting: Oncology

## 2014-11-19 ENCOUNTER — Ambulatory Visit (HOSPITAL_BASED_OUTPATIENT_CLINIC_OR_DEPARTMENT_OTHER): Payer: Medicare Other | Admitting: Oncology

## 2014-11-19 VITALS — BP 100/60 | HR 124 | Temp 98.0°F | Resp 18 | Ht 64.0 in | Wt 226.2 lb

## 2014-11-19 DIAGNOSIS — C8298 Follicular lymphoma, unspecified, lymph nodes of multiple sites: Secondary | ICD-10-CM

## 2014-11-19 DIAGNOSIS — C8590 Non-Hodgkin lymphoma, unspecified, unspecified site: Secondary | ICD-10-CM

## 2014-11-19 DIAGNOSIS — I10 Essential (primary) hypertension: Secondary | ICD-10-CM | POA: Diagnosis not present

## 2014-11-19 DIAGNOSIS — I2699 Other pulmonary embolism without acute cor pulmonale: Secondary | ICD-10-CM

## 2014-11-19 NOTE — Telephone Encounter (Signed)
Gave avs & calendar for July °

## 2014-11-19 NOTE — Progress Notes (Signed)
  [facility] OFFICE PROGRESS NOTE   Diagnosis: Non-Hodgkin's lymphoma  INTERVAL HISTORY:   Crystal [name] returns as scheduled. She reports feeling well. No palpable lymph nodes. She has hot flashes. She has noted a firm area superior to the right lumpectomy scar. This was evaluated at the [facility] Center and was consistent with fat necrosis.  Objective:  Vital signs in last 24 hours:  Blood pressure 100/60, pulse 124, temperature 98 F (36.7 C), temperature source Oral, resp. rate 18, height 5\' 4"  (1.626 m), weight 226 lb 3.2 oz (102.604 kg).    HEENT: Neck without mass Lymphatics: No cervical, supra-clavicular, axillary, or inguinal nodes Resp: Lungs clear bilaterally Cardio: Regular rate and rhythm GI: No hepatosplenomegaly, nontender, no mass Vascular: No leg edema Breast: Superior to the right lumpectomy scar there is a linear area of firmness. This feels like a suture granuloma.     Medications: I have reviewed the patient's current medications.  Assessment/Plan: 1. Low-grade follicular lymphoma involving a right parotid mass, status post an excisional biopsy on 11/28/2010. 1. Staging CT scans 01/03/2011 confirmed an increased number of small nodes in the neck, left axilla and pelvis without clear evidence of pathologic lymphadenopathy. 2. PET scan 01/11/2011 confirmed hypermetabolic lymph nodes in the right cervical chain, left axillary nodes, periaortic, common iliac, external iliac and inguinal nodes. There was also a possible area of involvement at the right tonsillar region. 3. Palpable left posterior cervical nodes confirmed on exam 05/15/2013- progressive left neck nodes on exam 08/18/2013. 4. Status post cycle 1 bendamustine/Rituxan beginning 08/28/2013. 5. Near-complete resolution of left neck adenopathy on exam 09/12/2013. 6. Status post cycle 2 bendamustine/Rituxan beginning 09/25/2013. 7. CT abdomen/pelvis 10/01/2013-near-complete response to therapy  with isolated borderline enlarged left iliac node measuring 1.37 m. Previously identified right peritoneal right pelvic sidewall adenopathy is resolved. 8. Cycle 3 bendamustine/Rituxan beginning 10/28/2013. 9. Cycle 4 bendamustine/Rituxan beginning 11/25/2013. 10. Cycle 5 of bendamustine/Rituxan beginning 12/24/2013. 11. Cycle 6 bendamustine/rituximab 01/27/2014. 2. Stage I right-sided breast cancer diagnosed in 1998. 3. History of congestive heart failure. 4. Hypertension. 5. Port-A-Cath placement 08/25/2013 in interventional radiology. Removed 04/22/2014. 6. Chills during the Rituxan infusion 08/28/2013. She was given Solu-Medrol. Rituxan was resumed and completed. 7. Abdominal pain following cycle 2 bendamustine/Rituxan-no explanation for the pain on a CT 10/01/2013, resolved after starting Protonix. 8. Tachycardia 12/23/2013. Chest CT showed a pulmonary embolus. She completed 3 months of anticoagulation. 9. Chest CT 12/23/2013. Small nonocclusive right lower lobe pulmonary embolus. Minimal thrombus burden. No other emboli demonstrated. Xarelto initiated. Right upper extremity and bilateral lower extremity Dopplers negative on 12/25/2013.   Disposition:  Crystal [name] remains in clinical remission from non-Hodgkins lymphoma. She will return for an office visit in 6 months. The area of firmness at the right lumpectomy scar appears to be a benign finding.  [name], MD  11/19/2014  4:45 PM

## 2014-11-20 ENCOUNTER — Encounter: Payer: Self-pay | Admitting: Cardiology

## 2014-11-20 ENCOUNTER — Ambulatory Visit (INDEPENDENT_AMBULATORY_CARE_PROVIDER_SITE_OTHER): Payer: Medicare Other | Admitting: Cardiology

## 2014-11-20 VITALS — BP 92/64 | HR 106 | Ht 65.0 in | Wt 230.2 lb

## 2014-11-20 DIAGNOSIS — I10 Essential (primary) hypertension: Secondary | ICD-10-CM

## 2014-11-20 DIAGNOSIS — R000 Tachycardia, unspecified: Secondary | ICD-10-CM | POA: Diagnosis not present

## 2014-11-20 DIAGNOSIS — Z86711 Personal history of pulmonary embolism: Secondary | ICD-10-CM

## 2014-11-20 LAB — CBC
HCT: 37.5 % (ref 36.0–46.0)
Hemoglobin: 12.7 g/dL (ref 12.0–15.0)
MCH: 29.5 pg (ref 26.0–34.0)
MCHC: 33.9 g/dL (ref 30.0–36.0)
MCV: 87.2 fL (ref 78.0–100.0)
MPV: 9.7 fL (ref 8.6–12.4)
PLATELETS: 229 10*3/uL (ref 150–400)
RBC: 4.3 MIL/uL (ref 3.87–5.11)
RDW: 15 % (ref 11.5–15.5)
WBC: 3.7 10*3/uL — ABNORMAL LOW (ref 4.0–10.5)

## 2014-11-20 NOTE — Progress Notes (Signed)
11/20/2014 Crystal [name]   [birth year]  [id number]  Primary Physician Simona Huh, MD Primary Cardiologist: Dr. Ellyn Hack  HPI:  The patient is a 69 year old female, followed by Dr. Ellyn Hack, with a history of chemotherapy-induced nonischemic cardiomyopathy in the past with a reduced ejection fraction. She initially had breast cancer then later developed non-Hodgkins lymphoma. This is being treated by Dr. Benay Spice. Her cardiomyopathy was felt to be doxorubicin induced. Ejection fraction in 2009 was as low as 33%. She underwent a left heart catheterization by Dr. Rex Kras during that time which revealed normal coronary arteries. Since then, she has had normalization of LV function. Last 2-D echo was in July 2015 revealing an ejection fraction of 50-55%. Other past medical history is significant for dyslipidemia and obesity. In March 2015, she was also diagnosed with a small nonocclusive right lower lobe pulmonary embolism. There was felt to be minimal thrombus burden  and no other emboli was demonstrated. She was thus initiated on Xarelto. Right upper extremity and bilateral lower extremity Dopplers were both negative during that time. It should be noted that she was asymptomatic when this recurred. A CT of the chest was ordered because EKG demonstrated sinus tachycardia. She denies any chest pain or dyspnea when first diagnosed.  She no longer takes Xarelto. Her last office visit with Dr. Ellyn Hack was in July 2015 and she was felt to be stable from a cardiac standpoint.  She presents to clinic today for follow-up after being advised by Dr. Benay Spice to be seen for reassessment. She was seen in his office yesterday for routine evaluation and was noted to be tachycardic with a heart rate of 124 bpm. She also reports palpitations over the last several days. She denies weakness, no fatigue, no chest discomfort, fevers, chills, nausea, vomiting, diarrhea. She does report occasional mild dyspnea and dizziness  but denies syncope/near-syncope. She reports normal PO intake. She denies any melena/hematochezia. Her medications have remained the same. She denies any recent increases in meds or new medications.   Current Outpatient Prescriptions  Medication Sig Dispense Refill  . aspirin EC 81 MG tablet Take 81 mg by mouth every morning.    . dicyclomine (BENTYL) 20 MG tablet Take 40 mg by mouth 2 (two) times daily.     . Ibuprofen-Diphenhydramine Cit (ADVIL PM) 200-38 MG TABS Take 2 tablets by mouth at bedtime.    Marland Kitchen LORazepam (ATIVAN) 0.5 MG tablet TAKE 1 TABLET BY MOUTH TWICE DAILY AS NEEDED 60 tablet 2  . losartan-hydrochlorothiazide (HYZAAR) 50-12.5 MG per tablet Take 1 tablet by mouth every morning.     . metoprolol (LOPRESSOR) 50 MG tablet TAKE 1 TABLET TWICE DAILY 180 tablet 3  . pantoprazole (PROTONIX) 40 MG tablet Take 1 tablet (40 mg total) by mouth daily. 30 tablet 2  . PROAIR HFA 108 (90 BASE) MCG/ACT inhaler   0   No current facility-administered medications for this visit.    Allergies  Allergen Reactions  . Codeine Other (See Comments)    Bad Headaches  . Coreg Other (See Comments)    "Made my legs hurt"  . Lisinopril Cough    History   Social History  . Marital Status: Widowed    Spouse Name: N/A    Number of Children: 3  . Years of Education: hs   Occupational History  . Retired    Social History Main Topics  . Smoking status: Never Smoker   . Smokeless tobacco: Never Used  . Alcohol Use: Yes  Comment: social   . Drug Use: No  . Sexual Activity: Not on file   Other Topics Concern  . Not on file   Social History Narrative   She is a widow mother of 2, with 1 child is deceased. She has 2 grandchildren. She is trying to get some walking in town and can do about 20-25 minutes his another 30 minutes she pushes a more night gets short of breath.   She is at retired [employer], he simply laid off after 45 years.   She usually comes accompanied by her  sister, who is also my patient.   She never was a smoker, and only occasionally has a social alcoholic beverage.     Review of Systems: General: negative for chills, fever, night sweats or weight changes.  Cardiovascular: negative for chest pain, dyspnea on exertion, edema, orthopnea, palpitations, paroxysmal nocturnal dyspnea or shortness of breath Dermatological: negative for rash Respiratory: negative for cough or wheezing Urologic: negative for hematuria Abdominal: negative for nausea, vomiting, diarrhea, bright red blood per rectum, melena, or hematemesis Neurologic: negative for visual changes, syncope, or dizziness All other systems reviewed and are otherwise negative except as noted above.    Blood pressure 92/64, pulse 106, height 5\' 5"  (1.651 m), weight 230 lb 3.2 oz (104.418 kg).  General appearance: alert, cooperative and no distress Neck: no carotid bruit and no JVD Lungs: clear to auscultation bilaterally Heart: tachy rate, regular rhythm Extremities: no LEE Pulses: 2+ and symmetric Skin: warm and dry Neurologic: Grossly normal  EKG: has been ordered. This demonstrates sinus tachycardia with occasional premature ventricular complexes. Heart rate 106 bpm  ASSESSMENT AND PLAN:   1. Sinus tachycardia: Patient has been tachycardic over the last several days and was noted to have a heart rate of 124 yesterday. EKG today demonstrates sinus tachycardia with ventricular rate of 106 bpm. Blood pressure is borderline low at 104/70 sitting and 92/64 standing. She is mildly symptomatic with occasional intermittent dizziness and palpitations but denies syncope/near-syncope. Given she has a history of PE that was diagnosed after an EKG revealed sinus tachycardia in the absence of symptoms as well as the fact that she is no longer on Xarelto and has a history of malignancy, we will check a d-dimer for risk assessment. If elevated, we will proceed with a CT of the chest to rule out  recurrent PE. Also recommended that we check a CBC to rule out anemia as well as a CMP to assess electrolytes and renal function to ensure that she is not dehydrated. Will also check a TSH. Since her blood pressure is stable, I recommended that we continue with Metroprolol for rate control. We'll also continue with her Hyzaar for now. She has been advised to monitor her blood pressure closely at home. If she develops any hypotension she has been instructed to notify our office. If hypotension, we will discontinue her Hyzaar but will try to continue Metroprolol for rate control. She has been instructed to follow-up next week with our office pharmacist for repeat blood pressure check to see if blood pressure medications need to be reduced/ discontinued.    Saavi Mceachron, BRITTAINYPA-C 11/20/2014 3:27 PM

## 2014-11-20 NOTE — Patient Instructions (Signed)
Your physician recommends that you return for lab work in: [facility].   Your physician recommends that you schedule a follow-up appointment with [facility] for blood pressure check.

## 2014-11-21 LAB — COMPREHENSIVE METABOLIC PANEL
ALK PHOS: 90 U/L (ref 39–117)
ALT: 11 U/L (ref 0–35)
AST: 15 U/L (ref 0–37)
Albumin: 3.6 g/dL (ref 3.5–5.2)
BUN: 23 mg/dL (ref 6–23)
CHLORIDE: 107 meq/L (ref 96–112)
CO2: 25 mEq/L (ref 19–32)
CREATININE: 1.44 mg/dL — AB (ref 0.50–1.10)
Calcium: 8.4 mg/dL (ref 8.4–10.5)
GLUCOSE: 90 mg/dL (ref 70–99)
Potassium: 3.6 mEq/L (ref 3.5–5.3)
SODIUM: 143 meq/L (ref 135–145)
Total Bilirubin: 0.3 mg/dL (ref 0.2–1.2)
Total Protein: 5.9 g/dL — ABNORMAL LOW (ref 6.0–8.3)

## 2014-11-21 LAB — TSH: TSH: 2.473 u[IU]/mL (ref 0.350–4.500)

## 2014-11-21 LAB — D-DIMER, QUANTITATIVE (NOT AT ARMC): D-Dimer, Quant: 0.38 ug/mL-FEU (ref 0.00–0.48)

## 2014-11-23 ENCOUNTER — Telehealth: Payer: Self-pay | Admitting: Cardiology

## 2014-11-23 ENCOUNTER — Other Ambulatory Visit: Payer: Self-pay | Admitting: Oncology

## 2014-11-23 DIAGNOSIS — C8599 Non-Hodgkin lymphoma, unspecified, extranodal and solid organ sites: Secondary | ICD-10-CM

## 2014-11-23 DIAGNOSIS — Z79899 Other long term (current) drug therapy: Secondary | ICD-10-CM

## 2014-11-23 NOTE — Telephone Encounter (Signed)
t would like lab results from Friday please.

## 2014-11-23 NOTE — Telephone Encounter (Signed)
Spoke with patient and provided her with preliminary lab results. Will route to B. Rosita Fire, [place] for final result

## 2014-11-26 NOTE — Telephone Encounter (Signed)
D-dimer normal. No clot. TSH normal. Scr elevated at 1.44. She may be dehydrated which may also explain why she has been tachycardic. Advised to hold HCTZ for 3 days. Monitor weight, check for leg edema, and monitor BP. Recommend repeat BMP in 1 week.  Advise to stay well hydrated.

## 2014-11-26 NOTE — Telephone Encounter (Signed)
Patient is on Hyzaar.   Home BPs 127/78  HR 93 119/73  HR 105 120/78  HR 92 106/71  HR 93 112/77  HR 94 104/73  HR 94 112/77 *101/67 *100/65 HR 84 *111/68 HR 99  Last three from 11/26/14

## 2014-11-26 NOTE — Telephone Encounter (Signed)
Per B. Rosita Fire, [place] - patient to HOLD hyzaar for 3 days. Patient will have lab work on Monday Feb 15th (BMET) Communicated this to patient. She voiced understanding

## 2014-11-27 ENCOUNTER — Ambulatory Visit: Payer: Medicare Other | Admitting: Pharmacist Clinician (PhC)/ Clinical Pharmacy Specialist

## 2014-11-30 DIAGNOSIS — Z79899 Other long term (current) drug therapy: Secondary | ICD-10-CM | POA: Diagnosis not present

## 2014-11-30 LAB — BASIC METABOLIC PANEL
BUN: 17 mg/dL (ref 6–23)
CHLORIDE: 106 meq/L (ref 96–112)
CO2: 24 mEq/L (ref 19–32)
Calcium: 9.5 mg/dL (ref 8.4–10.5)
Creat: 0.91 mg/dL (ref 0.50–1.10)
Glucose, Bld: 107 mg/dL — ABNORMAL HIGH (ref 70–99)
POTASSIUM: 3.9 meq/L (ref 3.5–5.3)
SODIUM: 140 meq/L (ref 135–145)

## 2014-12-01 ENCOUNTER — Other Ambulatory Visit: Payer: Self-pay | Admitting: *Deleted

## 2014-12-01 DIAGNOSIS — Z79899 Other long term (current) drug therapy: Secondary | ICD-10-CM

## 2014-12-01 DIAGNOSIS — K091 Developmental (nonodontogenic) cysts of oral region: Secondary | ICD-10-CM

## 2014-12-08 DIAGNOSIS — Z79899 Other long term (current) drug therapy: Secondary | ICD-10-CM | POA: Diagnosis not present

## 2014-12-09 ENCOUNTER — Telehealth: Payer: Self-pay | Admitting: Cardiology

## 2014-12-09 LAB — BASIC METABOLIC PANEL
BUN: 17 mg/dL (ref 6–23)
CALCIUM: 8.4 mg/dL (ref 8.4–10.5)
CO2: 24 meq/L (ref 19–32)
Chloride: 105 mEq/L (ref 96–112)
Creat: 1.07 mg/dL (ref 0.50–1.10)
GLUCOSE: 102 mg/dL — AB (ref 70–99)
Potassium: 3.5 mEq/L (ref 3.5–5.3)
SODIUM: 142 meq/L (ref 135–145)

## 2014-12-09 NOTE — Telephone Encounter (Signed)
Crystal [name] is calling to say when calling her with her lab results please call her on her cell . Thanks

## 2014-12-09 NOTE — Telephone Encounter (Signed)
[place] , [place]

## 2014-12-10 ENCOUNTER — Telehealth: Payer: Self-pay | Admitting: Cardiology

## 2014-12-10 NOTE — Telephone Encounter (Signed)
Pt called in stating that she had some labs done on 2/16 and she would like to know if her results were in yet. Please f/u with pt  Thanks

## 2014-12-10 NOTE — Telephone Encounter (Signed)
Results reported to patient. She voiced understanding.

## 2014-12-16 ENCOUNTER — Telehealth: Payer: Self-pay | Admitting: *Deleted

## 2014-12-16 ENCOUNTER — Encounter: Payer: Self-pay | Admitting: Neurology

## 2014-12-16 NOTE — Telephone Encounter (Signed)
I will write a letter for her, okay to have the vaccination.

## 2014-12-18 ENCOUNTER — Telehealth: Payer: Self-pay

## 2014-12-18 NOTE — Telephone Encounter (Signed)
Pt walked into [facility] requesting a letter stating it was OK for her to receive MMR vaccine. S/w Dr Benay Spice and generated a letter that pt can receive MMR. Letter faxed to [employer]. Pt told it was OK to get her MMR vaccine.

## 2015-04-04 DIAGNOSIS — R21 Rash and other nonspecific skin eruption: Secondary | ICD-10-CM | POA: Diagnosis not present

## 2015-04-19 ENCOUNTER — Other Ambulatory Visit: Payer: Self-pay

## 2015-05-11 ENCOUNTER — Ambulatory Visit (HOSPITAL_BASED_OUTPATIENT_CLINIC_OR_DEPARTMENT_OTHER): Payer: Medicare Other | Admitting: Oncology

## 2015-05-11 ENCOUNTER — Telehealth: Payer: Self-pay | Admitting: Oncology

## 2015-05-11 VITALS — BP 127/87 | HR 104 | Temp 98.2°F | Resp 18 | Ht 65.0 in | Wt 229.9 lb

## 2015-05-11 DIAGNOSIS — C8298 Follicular lymphoma, unspecified, lymph nodes of multiple sites: Secondary | ICD-10-CM

## 2015-05-11 DIAGNOSIS — Z86711 Personal history of pulmonary embolism: Secondary | ICD-10-CM | POA: Diagnosis not present

## 2015-05-11 DIAGNOSIS — I2699 Other pulmonary embolism without acute cor pulmonale: Secondary | ICD-10-CM

## 2015-05-11 DIAGNOSIS — I10 Essential (primary) hypertension: Secondary | ICD-10-CM | POA: Diagnosis not present

## 2015-05-11 DIAGNOSIS — C8590 Non-Hodgkin lymphoma, unspecified, unspecified site: Secondary | ICD-10-CM

## 2015-05-11 DIAGNOSIS — I428 Other cardiomyopathies: Secondary | ICD-10-CM

## 2015-05-11 DIAGNOSIS — Z23 Encounter for immunization: Secondary | ICD-10-CM

## 2015-05-11 DIAGNOSIS — Z853 Personal history of malignant neoplasm of breast: Secondary | ICD-10-CM | POA: Diagnosis not present

## 2015-05-11 MED ORDER — PNEUMOCOCCAL VAC POLYVALENT 25 MCG/0.5ML IJ INJ
0.5000 mL | INJECTION | Freq: Once | INTRAMUSCULAR | Status: AC
  Administered 2015-05-11: 0.5 mL via INTRAMUSCULAR
  Filled 2015-05-11: qty 0.5

## 2015-05-11 MED ORDER — PNEUMOCOCCAL 13-VAL CONJ VACC IM SUSP: 0.5000 mL | Freq: Once | INTRAMUSCULAR | Status: DC

## 2015-05-11 NOTE — Progress Notes (Signed)
  [facility] OFFICE PROGRESS NOTE   Diagnosis: Non-Hodgkin's lymphoma  INTERVAL HISTORY:   Crystal [name] returns as scheduled. She feels well. No fever or anorexia. No palpable lymph nodes. She has returned to work.  Objective:  Vital signs in last 24 hours:  Blood pressure 127/87, pulse 104, temperature 98.2 F (36.8 C), temperature source Oral, resp. rate 18, height '5\' 5"'$  (1.651 m), weight 229 lb 14.4 oz (104.282 kg), SpO2 100 %.    HEENT: Neck without mass Lymphatics: No cervical, supra-clavicular, axillary, or inguinal nodes Resp: Lungs with coarse inspiratory rhonchi at the posterior bases on the right greater than left, no respiratory distress Cardio: Regular rate and rhythm GI: No hepatosplenomegaly, no mass Vascular: No leg edema   Lab Results:  Lab Results  Component Value Date   WBC 3.7* 11/20/2014   HGB 12.7 11/20/2014   HCT 37.5 11/20/2014   MCV 87.2 11/20/2014   PLT 229 11/20/2014   NEUTROABS 1.9 06/30/2014     Medications: I have reviewed the patient's current medications.  Assessment/Plan: 1. Low-grade follicular lymphoma involving a right parotid mass, status post an excisional biopsy on 11/28/2010. 1. Staging CT scans 01/03/2011 confirmed an increased number of small nodes in the neck, left axilla and pelvis without clear evidence of pathologic lymphadenopathy. 2. PET scan 01/11/2011 confirmed hypermetabolic lymph nodes in the right cervical chain, left axillary nodes, periaortic, common iliac, external iliac and inguinal nodes. There was also a possible area of involvement at the right tonsillar region. 3. Palpable left posterior cervical nodes confirmed on exam 05/15/2013- progressive left neck nodes on exam 08/18/2013. 4. Status post cycle 1 bendamustine/Rituxan beginning 08/28/2013. 5. Near-complete resolution of left neck adenopathy on exam 09/12/2013. 6. Status post cycle 2 bendamustine/Rituxan beginning 09/25/2013. 7. CT  abdomen/pelvis 10/01/2013-near-complete response to therapy with isolated borderline enlarged left iliac node measuring 1.37 m. Previously identified right peritoneal right pelvic sidewall adenopathy is resolved. 8. Cycle 3 bendamustine/Rituxan beginning 10/28/2013. 9. Cycle 4 bendamustine/Rituxan beginning 11/25/2013. 10. Cycle 5 of bendamustine/Rituxan beginning 12/24/2013. 11. Cycle 6 bendamustine/rituximab 01/27/2014. 2. Stage I right-sided breast cancer diagnosed in 1998. 3. History of congestive heart failure. 4. Hypertension. 5. Port-A-Cath placement 08/25/2013 in interventional radiology. Removed 04/22/2014. 6. Chills during the Rituxan infusion 08/28/2013. She was given Solu-Medrol. Rituxan was resumed and completed. 7. Abdominal pain following cycle 2 bendamustine/Rituxan-no explanation for the pain on a CT 10/01/2013, resolved after starting Protonix. 8. Tachycardia 12/23/2013. Chest CT showed a pulmonary embolus. She completed 3 months of anticoagulation. 9. Chest CT 12/23/2013. Small nonocclusive right lower lobe pulmonary embolus. Minimal thrombus burden. No other emboli demonstrated. Xarelto initiated. Right upper extremity and bilateral lower extremity Dopplers negative on 12/25/2013.   Disposition:  Crystal [name] remains in clinical remission from non-Hodgkin's lymphoma. She has received the 13 valent pneumococcal vaccine in the past. She will receive the 23 valent pneumococcal vaccine today.  Crystal [name] will return for an office visit in 6 months.  Crystal Coder, MD  05/11/2015  3:34 PM

## 2015-05-11 NOTE — Telephone Encounter (Signed)
per pof to sch appt-gave [pt copy of avs

## 2015-05-18 IMAGING — CT CT ABD-PELV W/ CM
2 of 5 series · 15 of 46 positions shown, 17 images · IV contrast (OMNIPAQUE)
Comparison: PET-CT-08/27/2013

CLINICAL DATA: History of non-Hodgkin's lymphoma, now with 1 week
of the right lower quadrant abdominal pain and nausea, ongoing chemo

EXAM:
CT ABDOMEN AND PELVIS WITH CONTRAST
TECHNIQUE: Multidetector CT imaging of the abdomen and pelvis was performed
using the standard protocol following bolus administration of
intravenous contrast.
CONTRAST:  100mL OMNIPAQUE IOHEXOL 300 MG/ML  SOLN

[Series 2: rtn a/p with · axial · 0.79mm/px · z∈[-434,-18]mm · 12 of 95 slices shown, 14 images]
[im 6/95  soft-tissue]
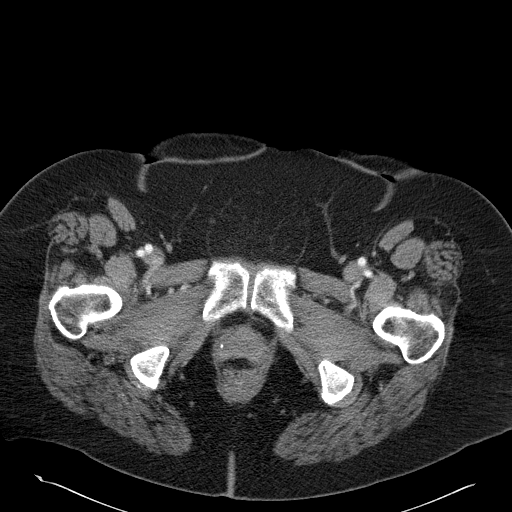
[im 6/95  bone]
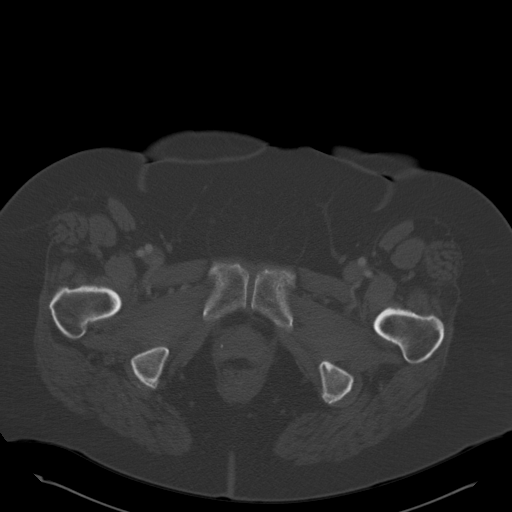
[im 16/95  soft-tissue]
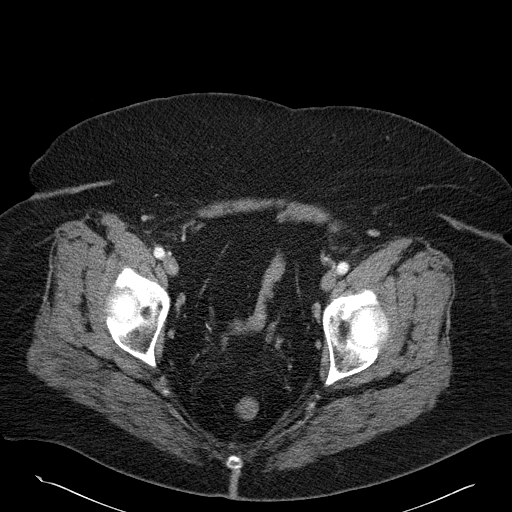
[im 21/95  soft-tissue]
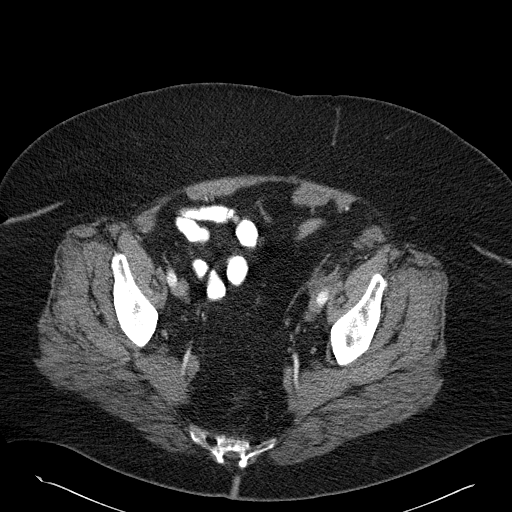
[im 27/95  soft-tissue]
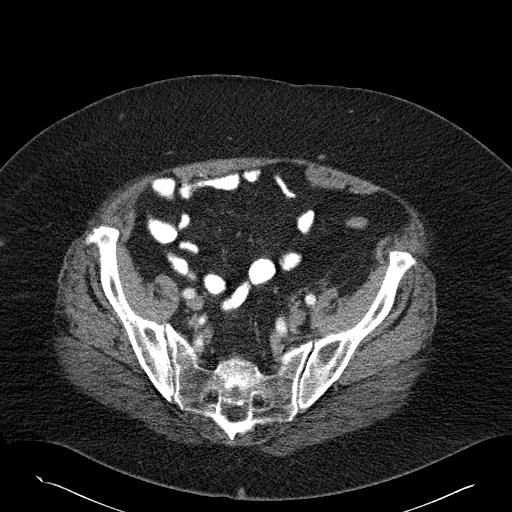
[im 37/95  soft-tissue]
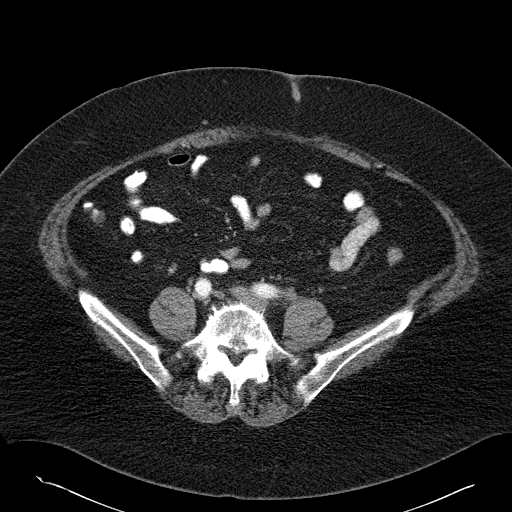
[im 42/95  soft-tissue]
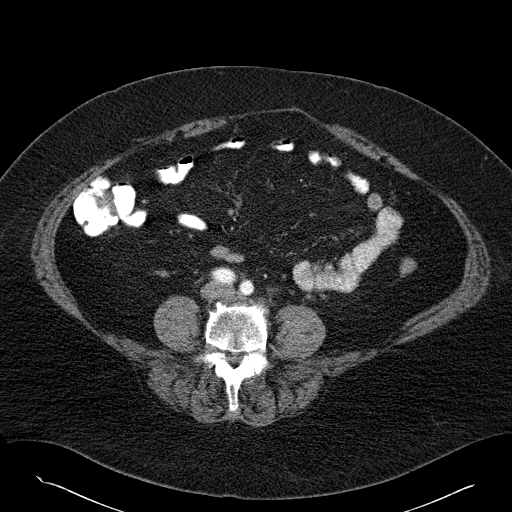
[im 53/95  soft-tissue]
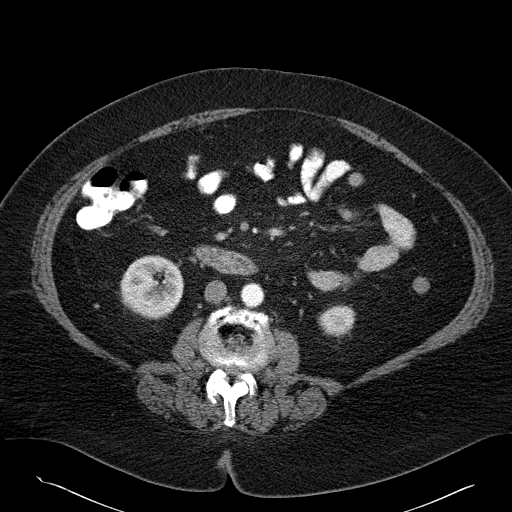
[im 58/95  soft-tissue]
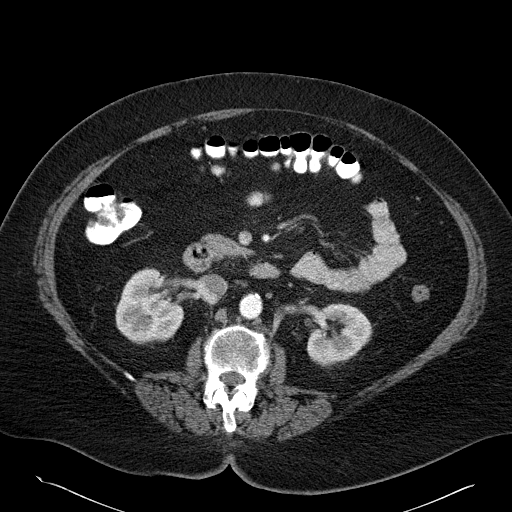
[im 68/95  soft-tissue]
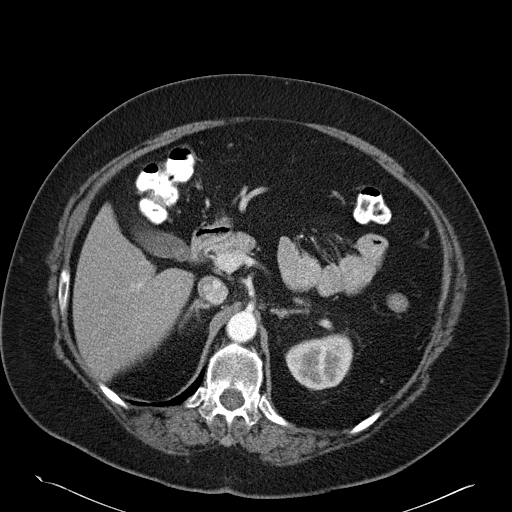
[im 68/95  bone]
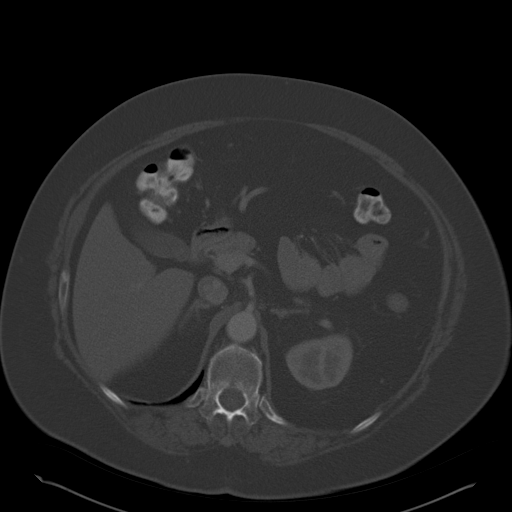
[im 74/95  soft-tissue]
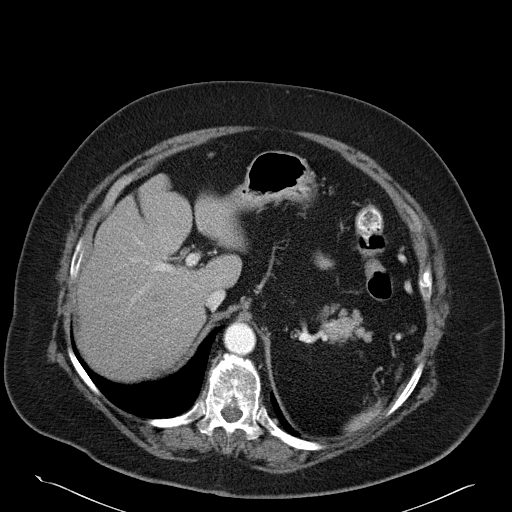
[im 79/95  soft-tissue]
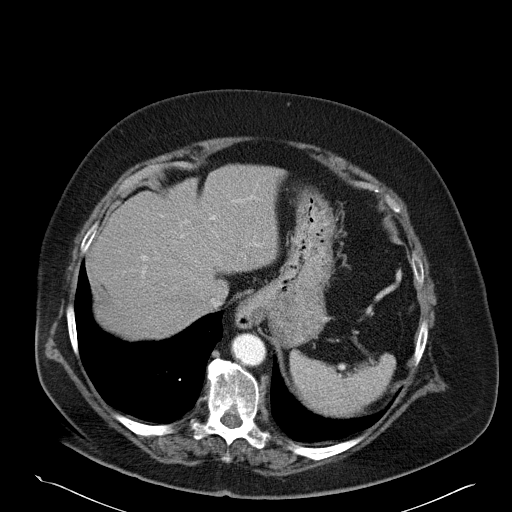
[im 89/95  soft-tissue]
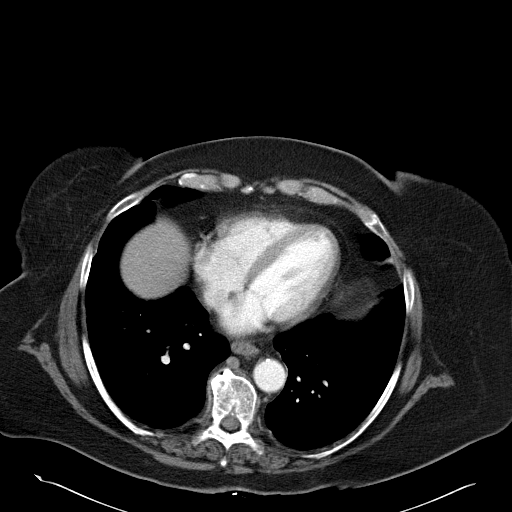

[Series 602: <mpr thick range> · coronal · 0.92mm/px · 3 of 107 slices shown]
[im 36/107  soft-tissue]
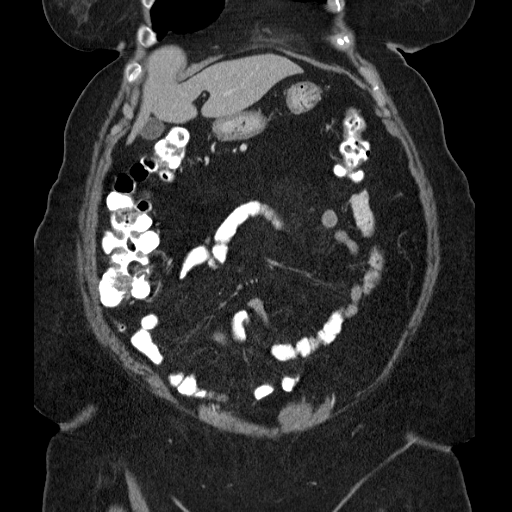
[im 48/107  soft-tissue]
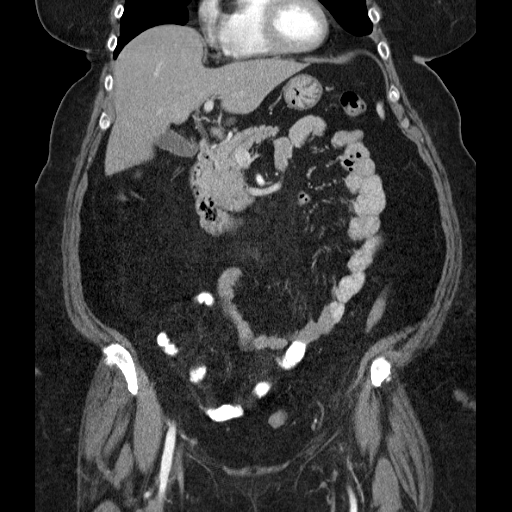
[im 59/107  soft-tissue]
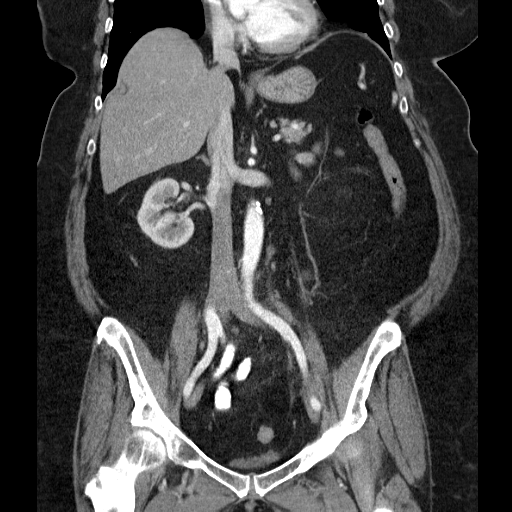

[15 of 46 positions shown; findings below may reference images not displayed]

FINDINGS: The previously noted retroperitoneal and pelvic lymphadenopathy has
nearly resolved in the interval.

Residual scattered shotty retroperitoneal lymph nodes are
individually not enlarged by size criteria with index left-sided
periaortic lymph node now measuring 0.5 cm in diameter (image 47,
series 2), previously, 1.4 cm.

- Index left common iliac chain lymph node now measures
approximately 0.6 cm in diameter (image 55, series 2), previously, 2
cm.

- Index right external iliac chain lymph node now measures
approximately 0.8 cm in diameter (image 73, series 2, previously,
1.7 cm.

- Index left external iliac chain lymph node remains borderline
enlarged measuring approximately 1.3 cm in diameter (image 73,
series 2, previously, 3.7 cm. Additionally, there is a very minimal
amount of stranding within in the left pelvic sidewall adjacent to
the this previously noted dominant nodal conglomeration of this does
appear improved since the prior examination.

---------------------------------------------------------------------------------

Normal hepatic contour. Unchanged calcification within the left lobe
of the liver (image 16, series 2), possibly the sequela of prior
granulomatous infection. No discrete hepatic lesions. Normal
appearance of the gallbladder given degree distention. No intra or
extrahepatic biliary duct dilatation. No ascites.

There is symmetric enhancement and excretion of the bilateral
kidneys. Left-sided hypo attenuating renal lesions the largest
lesion within the interpolar aspect of the left kidney measuring
approximately 0.9 cm in diameter (image 18, series [DATE] spot likely
characterize of favored to represent a renal cyst. No discrete
right-sided renal lesions. No definite renal stones on this
postcontrast examination. There is a very minimal amount of grossly
symmetric, likely body habitus related perinephric stranding. Normal
appearance of the bilateral adrenal glands, pancreas and spleen.

Hiatal hernia. Ingestion enteric contrast extends to the level of
the splenic flexure of the colon. The descending and sigmoid colon
are decompressed but otherwise normal. Normal appearance of the
terminal ileum. No evidence of enteric obstruction. Normal
appearance of the appendix. No pneumoperitoneum, pneumatosis or
portal venous gas.

Scattered minimal atherosclerotic plaque within a normal caliber
abdominal aorta. The major branch vessels of the abdominal aorta
appear patent on this non CTA examination.

Limited visualization the lower thorax demonstrates minimal
subsegmental atelectasis within the imaged caudal aspect of the
right middle lobe and lingula. No focal airspace opacities. No
pleural effusion. Normal heart size. No pericardial effusion.

No acute or aggressive osseus abnormalities. There is multilevel
mild to moderate lumbar spine DDD. Stigmata of DISH within the
caudal aspect of the thoracic spine. Regional soft tissues appear
normal.
IMPRESSION: 1. No explanation for patient's right lower quadrant abdominal pain.
Specifically, no evidence of enteric or urinary obstruction. Normal
appearance of the appendix. No evidence of typhlitis.
2. Overall findings compatible with near complete response to
therapy with isolated borderline enlarged left iliac chain lymph
node measuring 1.3 cm in diameter (previously, 3.7 cm). Additional
previously identified retroperitoneal and right-sided pelvic
adenopathy has resolved in the interval
3. Hiatal hernia.

## 2015-06-01 ENCOUNTER — Telehealth: Payer: Self-pay | Admitting: *Deleted

## 2015-06-01 NOTE — Telephone Encounter (Signed)
VM message received from patient @ 1028 am regarding a rash pt has on her face and arms and legs.  Returned call to patient. She states she developed a rash after using a new face cream from [employer].  She went to a walk-in clinic and was prescribed some prednisone with relief of itching.  She has completed the prednisone and is itching again. She takes benadryl @ night with some relief.  Advised pt to consult with her PCP or see a dermatologist. She is not receiving active chemo at this time and sees Dr. Benay Spice only every 6 months at this time.  She voiced understanding. She stated she had made an appointment with dermatologist but cancelled it. Advised her to re-schedule that appointment.

## 2015-07-02 ENCOUNTER — Ambulatory Visit: Payer: Medicare Other | Admitting: Cardiology

## 2015-07-02 ENCOUNTER — Encounter: Payer: Self-pay | Admitting: Cardiology

## 2015-07-02 ENCOUNTER — Ambulatory Visit (INDEPENDENT_AMBULATORY_CARE_PROVIDER_SITE_OTHER): Payer: Medicare Other | Admitting: Cardiology

## 2015-07-02 VITALS — BP 120/78 | HR 91 | Ht 64.0 in | Wt 229.8 lb

## 2015-07-02 DIAGNOSIS — I429 Cardiomyopathy, unspecified: Secondary | ICD-10-CM | POA: Diagnosis not present

## 2015-07-02 DIAGNOSIS — R42 Dizziness and giddiness: Secondary | ICD-10-CM

## 2015-07-02 DIAGNOSIS — I10 Essential (primary) hypertension: Secondary | ICD-10-CM | POA: Diagnosis not present

## 2015-07-02 DIAGNOSIS — I2699 Other pulmonary embolism without acute cor pulmonale: Secondary | ICD-10-CM

## 2015-07-02 DIAGNOSIS — R0609 Other forms of dyspnea: Secondary | ICD-10-CM | POA: Diagnosis not present

## 2015-07-02 DIAGNOSIS — R0600 Dyspnea, unspecified: Secondary | ICD-10-CM

## 2015-07-02 DIAGNOSIS — E785 Hyperlipidemia, unspecified: Secondary | ICD-10-CM | POA: Diagnosis not present

## 2015-07-02 DIAGNOSIS — I428 Other cardiomyopathies: Secondary | ICD-10-CM

## 2015-07-02 NOTE — Patient Instructions (Signed)
NO CHANGES  AT PRESENT TIME  TRY TO EXERCISE A LITTLE MORE.   Your physician wants you to follow-up in [place]. You will receive a reminder letter in the mail two months in advance. If you don't receive a letter, please call our office to schedule the follow-up appointment.

## 2015-07-02 NOTE — Progress Notes (Signed)
PCP: Simona Huh, MD  Clinic Note: Chief Complaint  Patient presents with  . Annual Exam    Occas. SOB, mild ankle edema at the end of the day, occas. lightheadedness.  No complaints of chest pain.  . Cardiomyopathy    HPI: Crystal [name] is a 69 y.o. female with a PMH below who presents today for delayed alternating MD/PA 6 month f/u for Cardiomyopathy (likely related to chemotherapy).  Crystal [name] was last seen by me on July 2015 & Lyda Jester, [place] in January  Recent Hospitalizations: none  Studies Reviewed:   Echo 04/2014:  Left ventricle: The cavity size was normal. Wall thickness was normal. Systolic function was low normal. The estimated ejection fraction was in the range of 50% to 55%. Doppler parameters are onsistent with abnormal left ventricular relaxation (grade 1diastolic dysfunction). - Aortic valve: Sclerosis without stenosis. There was no regurgitation. - Mitral valve: Thickening of the anterior leaflet - this could be consistent with chest beam radiation. There was trivial regurgitation. - Left atrium: LA Volume/BSA= 14.1 ml/m2. The atrium was normal insize. - Atrial septum: No defect or patent foramen ovale was identified. - Inferior vena cava: The vessel was normal in size. The respirophasic diameter changes were in the normal range (= 50%), consistent with normal central venous pressure.  Impressions:  - Compared to echo in 10/2012, there may be a mild decrease in global systolic function to 32-99%. Strain imaging would be helpful, but was not performed.  Interval History: Crystal [name] presents today doing relatively well overall from a Cardiac standpoint.  Her most notable complaint is that she still is short of breath when doing pretty much any activity. She is upset that she has not been able to lose weight -- mostly b/c inability to exercise. She notes mild swelling in the legs at the end of the day, but nothing significant. No PND or  orthopnea.  Occasional positional dizziness with occasional episodes where she feels her heart rate gets up fast her period but not sustain rapid rhythm were irregular heartbeat episode.   No chest pain or pressurewith rest or exertion. No weakness, syncope/near syncope, or TIA/amaurosis fugax symptoms. No claudication.   She has a membership to [employer], but this has not yet gotten herself motivated to go. She feels tired a lot because she just is unable to sleep. She has gone back to work since retirement. 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  Past Medical History  Diagnosis Date  . Essential hypertension   . Nonischemic cardiomyopathy 2008    ? Doxorubincin induced; essentially resolved as of echo in January 2014, current EF 50-55%. Grade 1 diastolic dysfunction.  . Dyslipidemia, goal LDL below 130   . Anxiety   . Severe obesity (BMI >= 40) 05/21/2013    Improve to BMI of 39 by July 2015  . Lymphoma     lymphoma dx 11/28/10 - left neck  . non hodgkins lymphoma 12/2010  . Breast cancer 1998    (Rt) lumpectomy dx 1999; Dr. Benay Spice  . Obesity     Past Surgical History  Procedure Laterality Date  . Abdominal hysterectomy    . Breast lumpectomy    . Cardiac catheterization  March 2009     None coronary disease, EF 45% (up from nuclear study EF of 30% in June '08)  . Doppler echocardiography  January 2014  EF 50-55%, no regional wall motion and melena. Grade 1 diastolic dysfunction, abnormal relaxation. Normal pressures. Mild aortic sclerosis, no stenosis.  . Portacath placement  August 25, 2013    rt. with tip in cavoatrial junction, Dr.Yamagata   . Colonoscopy    . Nm myoview ltd  03/29/2007    EF 33%,NEGATIVE ISCHEMIA, prob need cath  . Transthoracic echocardiogram  05/01/2014    Normal LV size with low normal function. EF of 50-55% and Gr 1 DD; aortic sclerosis without stenosis. MAC and  thickening/calcification of the anterior leaflet - no notable AI / AS or MR/MS  . Nm myoview ltd  05/01/2014    Lexiscan: EF 55%. Normal wall motion; no ischemia or infarction; apical thinning    ROS: A comprehensive was performed. Review of Systems  Constitutional: Positive for malaise/fatigue.  HENT: Negative for nosebleeds.   Cardiovascular: Positive for leg swelling. Negative for claudication.  Gastrointestinal: Negative for blood in stool and melena.  Genitourinary: Negative for hematuria.  Musculoskeletal: Positive for myalgias and joint pain (Knees give out on her sometimes.).  Neurological: Positive for dizziness (See history of present illness).  Endo/Heme/Allergies: Negative.   Psychiatric/Behavioral: The patient has insomnia.        She seems to be somewhat dysthymic  All other systems reviewed and are negative.   Prior to Admission medications   Medication Sig Start Date End Date Taking? Authorizing Provider  aspirin EC 81 MG tablet Take 81 mg by mouth every morning.   Yes Historical Provider, MD  LORazepam (ATIVAN) 0.5 MG tablet TAKE 1 TABLET BY MOUTH TWICE DAILY AS NEEDED 07/27/14  Yes Ladell Pier, MD  losartan-hydrochlorothiazide (HYZAAR) 50-12.5 MG per tablet Take 1 tablet by mouth every morning.    Yes Historical Provider, MD  metoprolol (LOPRESSOR) 50 MG tablet TAKE 1 TABLET TWICE DAILY 10/22/14  Yes Leonie Man, MD   Allergies  Allergen Reactions  . Codeine Other (See Comments)    Bad Headaches  . Coreg Other (See Comments)    "Made my legs hurt"  . Lisinopril Cough     Social History   Social History  . Marital Status: Widowed    Spouse Name: N/A  . Number of Children: 3  . Years of Education: hs   Occupational History  . Retired    Social History Main Topics  . Smoking status: Never Smoker   . Smokeless tobacco: Never Used  . Alcohol Use: Yes     Comment: social   . Drug Use: No  . Sexual Activity: Not Asked   Other Topics Concern    . None   Social History Narrative   She is a widow mother of 2, with 1 child is deceased. She has 2 grandchildren. She is trying to get some walking in town and can do about 20-25 minutes his another 30 minutes she pushes a more night gets short of breath.   She is at retired [employer], he simply laid off after 45 years.   She usually comes accompanied by her sister, who is also my patient.   She never was a smoker, and only occasionally has a social alcoholic beverage.   Family History  Problem Relation Age of Onset  . Heart Problems Mother     CABG  . Heart Problems Father   . Heart Problems Maternal Grandmother   . Stroke Maternal Grandfather   . Cancer Paternal Grandmother     stomach  . Heart Problems Paternal  Grandfather   . Hypertension Brother     and lupus  . Heart attack Sister   . Hypertension Sister     x2     Wt Readings from Last 3 Encounters:  07/02/15 229 lb 12.8 oz (104.237 kg)  05/11/15 229 lb 14.4 oz (104.282 kg)  11/20/14 230 lb 3.2 oz (104.418 kg)    PHYSICAL EXAM BP 120/78 mmHg  Pulse 91  Ht '5\' 4"'$  (1.626 m)  Wt 229 lb 12.8 oz (104.237 kg)  BMI 39.43 kg/m2 General appearance: alert, cooperative, appears stated age, no distress, moderate-severely obese and Otherwise healthy-appearing. Well-groomed. Answers questions appropriately.  HEENT: [place]/AT, EOMI, MMM, anicteric sclera Neck: moderate anterior cervical adenopathy, no carotid bruit, no JVD and supple, symmetrical, trachea midline  Lungs: CTAB, non-labored normal percussion, good air movement.  Heart: RRR, S1, S2 normal, no murmur, click, rub or gallop and normal apical impulse  Abdomen: soft, non-tender; bowel sounds normal; no masses, no organomegaly and Significant truncal obesity  Extremities: edema Trace,, varicose veins noted, but not engorged -- more like spider veins  Pulses: 2+ and symmetric  Neurologic: Grossly normal   Adult ECG Report  Rate: 91 ;  Rhythm: normal  sinus rhythm; normal axis, intervals, durations. Nonspecific ST-T wave abnormalities.  Narrative Interpretation: a notable change from last study was that rate was slower.   Other studies Reviewed: Additional studies/ records that were reviewed today include:  Recent Labs:  No recent labs     ASSESSMENT / PLAN: Problem List Items Addressed This Visit    Dizziness   Relevant Orders   EKG 12-Lead   Dyslipidemia, goal LDL below 130 - Primary (Chronic)    Monitored by PCP not currently on medications. No recent labs.      Relevant Orders   EKG 12-Lead   Dyspnea on exertion (Chronic)    Multifactorial but mostly related to deconditioning and weight. She's had negative Myoview and relative normal echocardiogram.      Relevant Orders   EKG 12-Lead   Essential hypertension (Chronic)    Well controlled.      Relevant Orders   EKG 12-Lead   Nonischemic cardiomyopathy (Chronic)    Essentially stabilized with low normal EF by echocardiogram. She probably has some diastolic dysfunction, denies any PND or orthopnea. I don't see any cardiac etiology for her dyspnea. I think most of this is related to deconditioning and lack of exercise. She remains on ARB and beta blocker.  With her heart rate in the 90s, I don't think that her fatigue her dyspnea is related to beta blocker effect.      Relevant Orders   EKG 12-Lead   Pulmonary embolus    This was evaluated, and treated by her hematology oncologist.      Severe obesity (BMI >= 40) (Chronic)    I think most of her exertional dyspnea is related to deconditioning and obesity. She needs to gradually picked up her exercise level she is thinking she cannot get out and start exercising. *I instructed her to begin slowly with a gradual increase in level of activity.         Current medicines are reviewed at length with the patient today. (+/- concerns) n/a The following changes have been made: n/a Studies Ordered:   Orders Placed  This Encounter  Procedures  . EKG 12-Lead    ROV - 1 yr  Leonie Man, M.D., M.S. Interventional Cardiologist   Pager # [phone]

## 2015-07-04 ENCOUNTER — Encounter: Payer: Self-pay | Admitting: Cardiology

## 2015-07-04 NOTE — Assessment & Plan Note (Signed)
Essentially stabilized with low normal EF by echocardiogram. She probably has some diastolic dysfunction, denies any PND or orthopnea. I don't see any cardiac etiology for her dyspnea. I think most of this is related to deconditioning and lack of exercise. She remains on ARB and beta blocker.  With her heart rate in the 90s, I don't think that her fatigue her dyspnea is related to beta blocker effect.

## 2015-07-04 NOTE — Assessment & Plan Note (Signed)
This was evaluated, and treated by her hematology oncologist.

## 2015-07-04 NOTE — Assessment & Plan Note (Signed)
Multifactorial but mostly related to deconditioning and weight. She's had negative Myoview and relative normal echocardiogram.

## 2015-07-04 NOTE — Assessment & Plan Note (Signed)
Monitored by PCP not currently on medications. No recent labs.

## 2015-07-04 NOTE — Assessment & Plan Note (Signed)
Well controlled 

## 2015-07-04 NOTE — Assessment & Plan Note (Signed)
I think most of her exertional dyspnea is related to deconditioning and obesity. She needs to gradually picked up her exercise level she is thinking she cannot get out and start exercising. *I instructed her to begin slowly with a gradual increase in level of activity.

## 2015-07-08 ENCOUNTER — Encounter: Payer: Self-pay | Admitting: Cardiology

## 2015-07-26 ENCOUNTER — Other Ambulatory Visit: Payer: Self-pay | Admitting: Cardiology

## 2015-07-26 NOTE — Telephone Encounter (Signed)
Rx(s) sent to pharmacy electronically.  

## 2015-07-28 ENCOUNTER — Telehealth: Payer: Self-pay | Admitting: Cardiology

## 2015-07-28 DIAGNOSIS — I872 Venous insufficiency (chronic) (peripheral): Secondary | ICD-10-CM | POA: Diagnosis not present

## 2015-07-28 DIAGNOSIS — D225 Melanocytic nevi of trunk: Secondary | ICD-10-CM | POA: Diagnosis not present

## 2015-07-28 DIAGNOSIS — L259 Unspecified contact dermatitis, unspecified cause: Secondary | ICD-10-CM | POA: Diagnosis not present

## 2015-07-28 NOTE — Telephone Encounter (Signed)
Pt called in stating that she dropped off a form for her to receive a handicapped placard and she wanted to know if Dr. Ellyn Hack had filled out the paperwork yet. Please f/u with her  Thanks

## 2015-07-28 NOTE — Telephone Encounter (Signed)
Spoke with pt, plaque will be mailed to pts confirmed home address.

## 2015-07-28 NOTE — Telephone Encounter (Signed)
Left message for pt to call, handicap plaque is complete. ? Pt picking up?

## 2015-09-27 ENCOUNTER — Other Ambulatory Visit: Payer: Self-pay

## 2015-09-27 DIAGNOSIS — Z1231 Encounter for screening mammogram for malignant neoplasm of breast: Secondary | ICD-10-CM

## 2015-10-05 DIAGNOSIS — C8590 Non-Hodgkin lymphoma, unspecified, unspecified site: Secondary | ICD-10-CM | POA: Diagnosis not present

## 2015-10-05 DIAGNOSIS — K219 Gastro-esophageal reflux disease without esophagitis: Secondary | ICD-10-CM | POA: Diagnosis not present

## 2015-10-05 DIAGNOSIS — F411 Generalized anxiety disorder: Secondary | ICD-10-CM | POA: Diagnosis not present

## 2015-10-05 DIAGNOSIS — G4700 Insomnia, unspecified: Secondary | ICD-10-CM | POA: Diagnosis not present

## 2015-10-05 DIAGNOSIS — Z6841 Body Mass Index (BMI) 40.0 and over, adult: Secondary | ICD-10-CM | POA: Diagnosis not present

## 2015-10-05 DIAGNOSIS — N183 Chronic kidney disease, stage 3 (moderate): Secondary | ICD-10-CM | POA: Diagnosis not present

## 2015-10-05 DIAGNOSIS — E7800 Pure hypercholesterolemia, unspecified: Secondary | ICD-10-CM | POA: Diagnosis not present

## 2015-10-05 DIAGNOSIS — I129 Hypertensive chronic kidney disease with stage 1 through stage 4 chronic kidney disease, or unspecified chronic kidney disease: Secondary | ICD-10-CM | POA: Diagnosis not present

## 2015-10-05 DIAGNOSIS — Z1389 Encounter for screening for other disorder: Secondary | ICD-10-CM | POA: Diagnosis not present

## 2015-10-05 DIAGNOSIS — K589 Irritable bowel syndrome without diarrhea: Secondary | ICD-10-CM | POA: Diagnosis not present

## 2015-10-31 ENCOUNTER — Telehealth: Payer: Self-pay | Admitting: Oncology

## 2015-10-31 NOTE — Telephone Encounter (Signed)
DUE TO CALL MOVED 1/19 F/U TO 1/31. LEFT MESSAGE FOR PATIENT.

## 2015-11-01 ENCOUNTER — Ambulatory Visit: Payer: Medicare Other

## 2015-11-09 ENCOUNTER — Ambulatory Visit: Payer: Medicare Other

## 2015-11-11 ENCOUNTER — Ambulatory Visit: Payer: Medicare Other | Admitting: Oncology

## 2015-11-22 ENCOUNTER — Ambulatory Visit
Admission: RE | Admit: 2015-11-22 | Discharge: 2015-11-22 | Disposition: A | Discharge status: Home / Self Care | Payer: Medicare Other | Source: Ambulatory Visit

## 2015-11-22 DIAGNOSIS — Z1231 Encounter for screening mammogram for malignant neoplasm of breast: Secondary | ICD-10-CM | POA: Diagnosis not present

## 2015-11-23 ENCOUNTER — Telehealth: Payer: Self-pay | Admitting: Oncology

## 2015-11-23 ENCOUNTER — Ambulatory Visit (HOSPITAL_BASED_OUTPATIENT_CLINIC_OR_DEPARTMENT_OTHER): Payer: Medicare Other | Admitting: Oncology

## 2015-11-23 VITALS — BP 121/76 | HR 86 | Temp 97.7°F | Resp 18 | Ht 64.0 in | Wt 226.2 lb

## 2015-11-23 DIAGNOSIS — I10 Essential (primary) hypertension: Secondary | ICD-10-CM

## 2015-11-23 DIAGNOSIS — C8599 Non-Hodgkin lymphoma, unspecified, extranodal and solid organ sites: Secondary | ICD-10-CM

## 2015-11-23 DIAGNOSIS — C8298 Follicular lymphoma, unspecified, lymph nodes of multiple sites: Secondary | ICD-10-CM

## 2015-11-23 MED ORDER — LORAZEPAM 0.5 MG PO TABS: 0.5000 mg | ORAL_TABLET | Freq: Two times a day (BID) | ORAL | Status: DC | PRN

## 2015-11-23 NOTE — Progress Notes (Signed)
  [facility] OFFICE PROGRESS NOTE   Diagnosis: Non-Hodgkin's lymphoma  INTERVAL HISTORY:   Crystal [name] returns as scheduled. She feels well. She has returned to work. No complaint. She had a bilateral mammogram 11/22/2015. This was a negative study.  Objective:  Vital signs in last 24 hours:  Blood pressure 121/76, pulse 86, temperature 97.7 F (36.5 C), temperature source Oral, resp. rate 18, height '5\' 4"'$  (1.626 m), weight 226 lb 3.2 oz (102.604 kg), SpO2 96 %.    HEENT: Neck without mass Lymphatics: No cervical, supraclavicular, axillary, or inguinal nodes Resp: Lungs clear bilaterally Cardio: Regular rate and rhythm GI: No hepatosplenomegaly, no mass Vascular: No leg edema Breasts: Status post right lumpectomy. Firm nodular scar tissue at the lateral superior aspect of the lumpectomy scar. No mass in either breast.     Imaging:  Mm Digital Screening Bilateral  11/22/2015  CLINICAL DATA:  Screening. EXAM: DIGITAL SCREENING BILATERAL MAMMOGRAM WITH CAD COMPARISON:  Previous exam(s). ACR Breast Density Category b: There are scattered areas of fibroglandular density. FINDINGS: There are no findings suspicious for malignancy. Images were processed with CAD. IMPRESSION: No mammographic evidence of malignancy. A result letter of this screening mammogram will be mailed directly to the patient. RECOMMENDATION: Screening mammogram in one year. (Code:SM-B-01Y) BI-RADS CATEGORY  1: Negative. Electronically Signed   By: Altamese Cabal M.D.   On: 11/22/2015 16:16    Medications: I have reviewed the patient's current medications.  Assessment/Plan: 1. Low-grade follicular lymphoma involving a right parotid mass, status post an excisional biopsy on 11/28/2010. 1. Staging CT scans 01/03/2011 confirmed an increased number of small nodes in the neck, left axilla and pelvis without clear evidence of pathologic lymphadenopathy. 2. PET scan 01/11/2011 confirmed hypermetabolic lymph  nodes in the right cervical chain, left axillary nodes, periaortic, common iliac, external iliac and inguinal nodes. There was also a possible area of involvement at the right tonsillar region. 3. Palpable left posterior cervical nodes confirmed on exam 05/15/2013- progressive left neck nodes on exam 08/18/2013. 4. Status post cycle 1 bendamustine/Rituxan beginning 08/28/2013. 5. Near-complete resolution of left neck adenopathy on exam 09/12/2013. 6. Status post cycle 2 bendamustine/Rituxan beginning 09/25/2013. 7. CT abdomen/pelvis 10/01/2013-near-complete response to therapy with isolated borderline enlarged left iliac node measuring 1.37 m. Previously identified right peritoneal right pelvic sidewall adenopathy is resolved. 8. Cycle 3 bendamustine/Rituxan beginning 10/28/2013. 9. Cycle 4 bendamustine/Rituxan beginning 11/25/2013. 10. Cycle 5 of bendamustine/Rituxan beginning 12/24/2013. 11. Cycle 6 bendamustine/rituximab 01/27/2014. 2. Stage I right-sided breast cancer diagnosed in 1998. 3. History of congestive heart failure. 4. Hypertension. 5. Port-A-Cath placement 08/25/2013 in interventional radiology. Removed 04/22/2014. 6. Chills during the Rituxan infusion 08/28/2013. She was given Solu-Medrol. Rituxan was resumed and completed. 7. Abdominal pain following cycle 2 bendamustine/Rituxan-no explanation for the pain on a CT 10/01/2013, resolved after starting Protonix. 8. Tachycardia 12/23/2013. Chest CT showed a pulmonary embolus. She completed 3 months of anticoagulation. 9. Chest CT 12/23/2013. Small nonocclusive right lower lobe pulmonary embolus. Minimal thrombus burden. No other emboli demonstrated. Xarelto initiated. Right upper extremity and bilateral lower extremity Dopplers negative on 12/25/2013.    Disposition: Ms. Bullis remains in clinical remission from non-Hodgkin's lymphoma. She will return for an office visit in 8 months.   [name], MD  11/23/2015  11:42  AM

## 2015-11-23 NOTE — Telephone Encounter (Signed)
Pt confirmed MD visit per 01/31 POF, gave pt AVS and Calendar... KJ

## 2015-11-25 MED FILL — LORazepam 0.5 MG TABS: 0.5 | 30 days supply | Qty: 60 | Fill #0

## 2016-01-17 MED FILL — LORazepam 0.5 MG TABS: 0.5 | 30 days supply | Qty: 60 | Fill #1

## 2016-02-09 DIAGNOSIS — T7840XA Allergy, unspecified, initial encounter: Secondary | ICD-10-CM | POA: Diagnosis not present

## 2016-02-15 MED FILL — CLOBETASOL 0.05% CREAM: 0.05 | 25 days supply | Qty: 60 | Fill #1

## 2016-02-21 ENCOUNTER — Telehealth: Payer: Self-pay | Admitting: *Deleted

## 2016-02-21 NOTE — Telephone Encounter (Signed)
Call from pt reporting a rash to BLE. Went to walk-in clinic at [facility] office and was given Prednisone. Rash is no longer itchy but has not gone away. Pt reports she saw dermatology prior to that and was given a cream. Pt wonders if rash is related to her lymphoma. Discussed with Dr. Benay Spice: Doubt it is related but we can see pt in the office to evaluate rash. Returned call to pt, she declined appt for now. Stated she will use cream given by dermatology for now. Will call if she wants to come in.

## 2016-03-06 MED FILL — CLOBETASOL 0.05% CREAM: 0.05 | 25 days supply | Qty: 60 | Fill #2

## 2016-07-03 ENCOUNTER — Ambulatory Visit: Payer: Medicare Other | Admitting: Cardiology

## 2016-07-17 ENCOUNTER — Telehealth: Payer: Self-pay | Admitting: Oncology

## 2016-07-17 ENCOUNTER — Ambulatory Visit (HOSPITAL_BASED_OUTPATIENT_CLINIC_OR_DEPARTMENT_OTHER): Payer: Medicare Other | Admitting: Oncology

## 2016-07-17 VITALS — BP 111/63 | HR 92 | Temp 98.0°F | Resp 17 | Ht 64.0 in | Wt 219.8 lb

## 2016-07-17 DIAGNOSIS — Z853 Personal history of malignant neoplasm of breast: Secondary | ICD-10-CM | POA: Diagnosis not present

## 2016-07-17 DIAGNOSIS — Z86711 Personal history of pulmonary embolism: Secondary | ICD-10-CM | POA: Diagnosis not present

## 2016-07-17 DIAGNOSIS — C8298 Follicular lymphoma, unspecified, lymph nodes of multiple sites: Secondary | ICD-10-CM

## 2016-07-17 DIAGNOSIS — N63 Unspecified lump in breast: Secondary | ICD-10-CM | POA: Diagnosis not present

## 2016-07-17 DIAGNOSIS — I10 Essential (primary) hypertension: Secondary | ICD-10-CM | POA: Diagnosis not present

## 2016-07-17 DIAGNOSIS — Z23 Encounter for immunization: Secondary | ICD-10-CM | POA: Diagnosis not present

## 2016-07-17 MED ORDER — INFLUENZA VAC SPLIT QUAD 0.5 ML IM SUSY
0.5000 mL | PREFILLED_SYRINGE | Freq: Once | INTRAMUSCULAR | Status: AC
Start: 2016-07-17 — End: 2016-07-17
  Administered 2016-07-17: 0.5 mL via INTRAMUSCULAR
  Filled 2016-07-17: qty 0.5

## 2016-07-17 NOTE — Telephone Encounter (Signed)
Gave patient avs report and appointments for September 2018.

## 2016-07-17 NOTE — Progress Notes (Signed)
  [facility] OFFICE PROGRESS NOTE   Diagnosis: Non-Hodgkin's lymphoma  INTERVAL HISTORY:   Ms. Urbanski returns as scheduled. She feels well. Good appetite. She has lost weight since returning to work. She has hot flashes. She continues to have a firm nodular area at the right lumpectomy scar. This has not changed.  Objective:  Vital signs in last 24 hours:  Blood pressure 111/63, pulse 92, temperature 98 F (36.7 C), temperature source Oral, resp. rate 17, height '5\' 4"'$  (1.626 m), weight 219 lb 12.8 oz (99.7 kg), SpO2 99 %.    HEENT: Neck without mass Lymphatics: No cervical, supraclavicular, axillary, or inguinal nodes Resp: Coarse rhonchi at the posterior base bilaterally, no respiratory distress Cardio: Regular rate and rhythm GI: No hepatosplenomegaly, no mass Vascular: No leg edema Breasts: Firm 1 cm area of circumscribed tissue superior to the lumpectomy scar. No mass in the right breast.   La Medications: I have reviewed the patient's current medications.  Assessment/Plan: 1. Low-grade follicular lymphoma involving a right parotid mass, status post an excisional biopsy on 11/28/2010. 1. Staging CT scans 01/03/2011 confirmed an increased number of small nodes in the neck, left axilla and pelvis without clear evidence of pathologic lymphadenopathy. 2. PET scan 01/11/2011 confirmed hypermetabolic lymph nodes in the right cervical chain, left axillary nodes, periaortic, common iliac, external iliac and inguinal nodes. There was also a possible area of involvement at the right tonsillar region. 3. Palpable left posterior cervical nodes confirmed on exam 05/15/2013- progressive left neck nodes on exam 08/18/2013. 4. Status post cycle 1 bendamustine/Rituxan beginning 08/28/2013. 5. Near-complete resolution of left neck adenopathy on exam 09/12/2013. 6. Status post cycle 2 bendamustine/Rituxan beginning 09/25/2013. 7. CT abdomen/pelvis 10/01/2013-near-complete  response to therapy with isolated borderline enlarged left iliac node measuring 1.37 m. Previously identified right peritoneal right pelvic sidewall adenopathy is resolved. 8. Cycle 3 bendamustine/Rituxan beginning 10/28/2013. 9. Cycle 4 bendamustine/Rituxan beginning 11/25/2013. 10. Cycle 5 of bendamustine/Rituxan beginning 12/24/2013. 11. Cycle 6 bendamustine/rituximab 01/27/2014. 2. Stage I right-sided breast cancer diagnosed in 1998. 3. History of congestive heart failure. 4. Hypertension. 5. Port-A-Cath placement 08/25/2013 in interventional radiology. Removed 04/22/2014. 6. Chills during the Rituxan infusion 08/28/2013. She was given Solu-Medrol. Rituxan was resumed and completed. 7. Abdominal pain following cycle 2 bendamustine/Rituxan-no explanation for the pain on a CT 10/01/2013, resolved after starting Protonix. 8. Tachycardia 12/23/2013. Chest CT showed a pulmonary embolus. She completed 3 months of anticoagulation. 9. Chest CT 12/23/2013. Small nonocclusive right lower lobe pulmonary embolus. Minimal thrombus burden. No other emboli demonstrated. Xarelto initiated. Right upper extremity and bilateral lower extremity Dopplers negative on 12/25/2013.    Disposition:  Ms. Wallis remains in clinical remission from non-Hodgkin's lymphoma. The firm nodular area at the right breast is likely related to scarring, suture granuloma?Marland Kitchen She will contact us if this area changes.  Ms. Pester will return for an office visit in one year. She received an influenza vaccine today.  [name], MD  07/17/2016  12:00 PM

## 2016-07-24 ENCOUNTER — Encounter: Payer: Self-pay | Admitting: Cardiology

## 2016-07-24 ENCOUNTER — Ambulatory Visit (INDEPENDENT_AMBULATORY_CARE_PROVIDER_SITE_OTHER): Payer: Medicare Other | Admitting: Cardiology

## 2016-07-24 VITALS — BP 102/64 | HR 87 | Ht 64.0 in | Wt 220.8 lb

## 2016-07-24 DIAGNOSIS — R0609 Other forms of dyspnea: Secondary | ICD-10-CM

## 2016-07-24 DIAGNOSIS — I428 Other cardiomyopathies: Secondary | ICD-10-CM

## 2016-07-24 DIAGNOSIS — I10 Essential (primary) hypertension: Secondary | ICD-10-CM | POA: Diagnosis not present

## 2016-07-24 DIAGNOSIS — E785 Hyperlipidemia, unspecified: Secondary | ICD-10-CM

## 2016-07-24 DIAGNOSIS — R0600 Dyspnea, unspecified: Secondary | ICD-10-CM

## 2016-07-24 DIAGNOSIS — E668 Other obesity: Secondary | ICD-10-CM | POA: Diagnosis not present

## 2016-07-24 NOTE — Patient Instructions (Signed)
IF YOU HAVE SOME DIZZINESS , YOU MAY TAKE LOSARTAN DOSE AT LUNCH TIME- AS DISCUSSED WITH DR HARDING.  NO OTHER CHANGES WITH CURRENT MEDICATION   Your physician wants you to follow-up in:12 MONTH WITH DR HARDING. You will receive a reminder letter in the mail two months in advance. If you don't receive a letter, please call our office to schedule the follow-up appointment.   If you need a refill on your cardiac medications before your next appointment, please call your pharmacy.

## 2016-07-24 NOTE — Assessment & Plan Note (Signed)
The factorial. Mostly related to deconditioning and obesity. Has had a negative cardiac evaluation was relatively normal EF by echo.

## 2016-07-24 NOTE — Assessment & Plan Note (Signed)
Again her exertional dyspnea is probably related more to obesity and deconditioning. She has lost a little bit of weight, but had gained weight back. We discussed importance of continuing to stay active.

## 2016-07-24 NOTE — Progress Notes (Signed)
PCP: Simona Huh, MD  Clinic Note: Chief Complaint  Patient presents with  . Follow-up    h/o Cardiomyopathy.    HPI: Crystal [name] is a 70 y.o. female with a PMH below who presents today for delayed alternating MD/PA 6 month f/u for Cardiomyopathy (likely related to chemotherapy).  Crystal [name] was last seen by me in Sept 2016.    Recent Hospitalizations: none  Studies Reviewed: none   Interval History: Crystal [name] presents today doing pretty well from a Cardiac standpoint.  Her most notable complaint is that she still is short of breath when doing pretty much any activity Beyond walking around a regular pace -- she'll note feeling a little short of breath if she has to walk a long way escorted patient.. She is upset that she has not been able to lose weight -- mostly b/c inability to exercise. No PND or orthopnea or even edema - WALKING more @ work.  Occasional positional dizziness with occasional episodes where she feels her heart rate gets up fast her period but not sustain rapid rhythm were irregular heartbeat episode.  No chest pain or pressure with rest or exertion. No weakness, syncope/near syncope, or TIA/amaurosis fugax symptoms. No claudication.   She has a membership to [employer], but this has not yet gotten herself motivated to go since being back to work. She feels tired a lot because she just is unable to sleep throughout the night - wakes up too early - but now able to fall back to sleep. She has gone back to work since retirement. 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  Cardiovascular ROS: positive for - dyspnea on exertion negative for - chest pain, edema, irregular heartbeat, loss of consciousness, murmur, orthopnea, palpitations, paroxysmal nocturnal dyspnea, rapid heart rate or TIA/Amaurosis Fugax   Past Medical History:  Diagnosis Date  . Anxiety   . Breast cancer ([place]) 1998   (Rt) lumpectomy dx  1999; Dr. Benay Spice  . Dyslipidemia, goal LDL below 130   . Essential hypertension   . Lymphoma ([place])    lymphoma dx 11/28/10 - left neck  . non hodgkins lymphoma 12/2010  . Nonischemic cardiomyopathy ([place]) 2008   ? Doxorubincin induced; essentially resolved as of echo in January 2014, current EF 50-55%. Grade 1 diastolic dysfunction.  . Obesity   . Severe obesity (BMI >= 40) ([place]) 05/21/2013   Improve to BMI of 39 by July 2015    Past Surgical History:  Procedure Laterality Date  . ABDOMINAL HYSTERECTOMY    . BREAST LUMPECTOMY    . CARDIAC CATHETERIZATION  March 2009    None coronary disease, EF 45% (up from nuclear study EF of 30% in June '08)  . COLONOSCOPY    . DOPPLER ECHOCARDIOGRAPHY  January 2014   EF 50-55%, no regional wall motion and melena. Grade 1 diastolic dysfunction, abnormal relaxation. Normal pressures. Mild aortic sclerosis, no stenosis.  Marland Kitchen NM MYOVIEW LTD  03/29/2007   EF 33%,NEGATIVE ISCHEMIA, prob need cath  . NM MYOVIEW LTD  05/01/2014   Lexiscan: EF 55%. Normal wall motion; no ischemia or infarction; apical thinning  . PORTACATH PLACEMENT  August 25, 2013   rt. with tip in cavoatrial junction, Dr.Yamagata   . TRANSTHORACIC ECHOCARDIOGRAM  05/01/2014   Normal LV size with low normal function. EF of 50-55% and Gr 1 DD; aortic sclerosis without stenosis. MAC and thickening/calcification of the anterior leaflet - no  notable AI / AS or MR/MS    ROS: A comprehensive was performed. Review of Systems  Constitutional: Negative for malaise/fatigue (doing better).  HENT: Negative for nosebleeds.   Cardiovascular: Positive for leg swelling. Negative for claudication.  Gastrointestinal: Negative for blood in stool and melena.  Genitourinary: Negative for hematuria.  Musculoskeletal: Negative for joint pain (Knees give out on her sometimes.) and myalgias.  Skin:       Rash on feet doing much better  Neurological: Positive for dizziness (See history of present illness --  Has ~ POTS ).  Endo/Heme/Allergies: Negative.   Psychiatric/Behavioral: The patient has insomnia (wakes up too early).        She seems to be somewhat dysthymic  All other systems reviewed and are negative.   Prior to Admission medications   Medication Sig Start Date End Date Taking? Authorizing Provider  aspirin EC 81 MG tablet Take 81 mg by mouth every morning.   Yes Historical Provider, MD  LORazepam (ATIVAN) 0.5 MG tablet Take 1 tablet (0.5 mg total) by mouth 2 (two) times daily as needed. 11/23/15  Yes Ladell Pier, MD  losartan-hydrochlorothiazide (HYZAAR) 50-12.5 MG per tablet Take 1 tablet by mouth every morning.    Yes Historical Provider, MD  metoprolol (LOPRESSOR) 50 MG tablet Take 1 tablet (50 mg total) by mouth 2 (two) times daily. 07/26/15  Yes Leonie Man, MD    Allergies  Allergen Reactions  . Codeine Other (See Comments)    Bad Headaches  . Coreg Other (See Comments)    "Made my legs hurt"  . Lisinopril Cough    Social History   Social History  . Marital status: Widowed    Spouse name: N/A  . Number of children: 3  . Years of education: hs   Occupational History  . Retired    Social History Main Topics  . Smoking status: Never Smoker  . Smokeless tobacco: Never Used  . Alcohol use Yes     Comment: social   . Drug use: No  . Sexual activity: Not Asked   Other Topics Concern  . None   Social History Narrative   She is a widow mother of 2, with 1 child is deceased. She has 2 grandchildren. She is trying to get some walking in town and can do about 20-25 minutes his another 30 minutes she pushes a more night gets short of breath.   She is at retired [employer], he simply laid off after 45 years.   She usually comes accompanied by her sister, who is also my patient.   She never was a smoker, and only occasionally has a social alcoholic beverage.    Family History  Problem Relation Age of Onset  . Heart Problems Mother     CABG  .  Heart Problems Father   . Heart Problems Maternal Grandmother   . Stroke Maternal Grandfather   . Cancer Paternal Grandmother     stomach  . Heart Problems Paternal Grandfather   . Hypertension Brother     and lupus  . Heart attack Sister   . Hypertension Sister     x2    Wt Readings from Last 3 Encounters:  07/24/16 100.2 kg (220 lb 12.8 oz)  07/17/16 99.7 kg (219 lb 12.8 oz)  11/23/15 102.6 kg (226 lb 3.2 oz)    PHYSICAL EXAM BP 102/64   Pulse 87   Ht '5\' 4"'$  (1.626 m)   Wt 100.2 kg (  220 lb 12.8 oz)   SpO2 94%   BMI 37.90 kg/m  General appearance: alert, cooperative, appears stated age, no distress, moderate-severely obese and Otherwise healthy-appearing. Well-groomed. Answers questions appropriately.  HEENT: [place]/AT, EOMI, MMM, anicteric sclera Neck: moderate anterior cervical adenopathy, no carotid bruit, no JVD and supple, symmetrical, trachea midline  Lungs: CTAB, non-labored normal percussion, good air movement.  Heart: RRR, S1, S2 normal, no murmur, click, rub or gallop and normal apical impulse  Abdomen: soft, non-tender; bowel sounds normal; no masses, no organomegaly and Significant truncal obesity  Extremities: edema Trace,, varicose veins noted, but not engorged -- more like spider veins  Pulses: 2+ and symmetric  Neurologic: Grossly normal   Adult ECG Report n/a  Other studies Reviewed: Additional studies/ records that were reviewed today include:  Recent Labs:  No recent labs - by pcp in Dec No results found for: CHOL, HDL, LDLCALC, LDLDIRECT, TRIG, CHOLHDL   ASSESSMENT / PLAN: Problem List Items Addressed This Visit    Nonischemic cardiomyopathy ([place]) (Chronic)    EF essentially back to low normal by echocardiogram. She may still have some diastolic dysfunction related dyspnea, but really overall relatively stable. She is on low-dose ARB and beta blocker. I don't have a lot of blood pressure room to titrate up either medication. Appears  euvolemic Not requiring any diuretic.      Moderate obesity (Chronic)    Again her exertional dyspnea is probably related more to obesity and deconditioning. She has lost a little bit of weight, but had gained weight back. We discussed importance of continuing to stay active.      Essential hypertension - Primary (Chronic)    Borderline hypotensive on current regimen. She has having some positional dizziness. I recommended that if this is occurring, she should take her ARB/HCTZ combination later on in the morning or in the afternoon after lunch.      Dyspnea on exertion (Chronic)    The factorial. Mostly related to deconditioning and obesity. Has had a negative cardiac evaluation was relatively normal EF by echo.      Dyslipidemia, goal LDL below 130 (Chronic)    Labs monitored by PCP. None available.       Other Visit Diagnoses   None.     Current medicines are reviewed at length with the patient today. (+/- concerns) n/a The following changes have been made: n/a IF YOU HAVE SOME DIZZINESS , YOU MAY TAKE LOSARTAN DOSE AT LUNCH TIME- AS DISCUSSED WITH DR Treyden Hakim.  NO OTHER CHANGES WITH CURRENT MEDICATION   Your physician wants you to follow-up in:12 MONTH WITH DR Yameli Delamater.   Studies Ordered:   No orders of the defined types were placed in this encounter.    Glenetta Hew, M.D., M.S. Interventional Cardiologist   Pager # [phone]

## 2016-07-24 NOTE — Assessment & Plan Note (Signed)
Borderline hypotensive on current regimen. She has having some positional dizziness. I recommended that if this is occurring, she should take her ARB/HCTZ combination later on in the morning or in the afternoon after lunch.

## 2016-07-24 NOTE — Assessment & Plan Note (Signed)
Labs monitored by PCP. None available.

## 2016-07-24 NOTE — Assessment & Plan Note (Signed)
EF essentially back to low normal by echocardiogram. She may still have some diastolic dysfunction related dyspnea, but really overall relatively stable. She is on low-dose ARB and beta blocker. I don't have a lot of blood pressure room to titrate up either medication. Appears euvolemic Not requiring any diuretic.

## 2016-08-01 DIAGNOSIS — B349 Viral infection, unspecified: Secondary | ICD-10-CM | POA: Diagnosis not present

## 2016-08-01 DIAGNOSIS — R05 Cough: Secondary | ICD-10-CM | POA: Diagnosis not present

## 2016-09-04 ENCOUNTER — Other Ambulatory Visit: Payer: Self-pay | Admitting: Cardiology

## 2016-10-03 DIAGNOSIS — F411 Generalized anxiety disorder: Secondary | ICD-10-CM | POA: Diagnosis not present

## 2016-10-03 DIAGNOSIS — K589 Irritable bowel syndrome without diarrhea: Secondary | ICD-10-CM | POA: Diagnosis not present

## 2016-10-03 DIAGNOSIS — I129 Hypertensive chronic kidney disease with stage 1 through stage 4 chronic kidney disease, or unspecified chronic kidney disease: Secondary | ICD-10-CM | POA: Diagnosis not present

## 2016-10-03 DIAGNOSIS — E7800 Pure hypercholesterolemia, unspecified: Secondary | ICD-10-CM | POA: Diagnosis not present

## 2016-10-03 DIAGNOSIS — I700 Atherosclerosis of aorta: Secondary | ICD-10-CM | POA: Diagnosis not present

## 2016-10-03 DIAGNOSIS — G4700 Insomnia, unspecified: Secondary | ICD-10-CM | POA: Diagnosis not present

## 2016-10-03 DIAGNOSIS — C8590 Non-Hodgkin lymphoma, unspecified, unspecified site: Secondary | ICD-10-CM | POA: Diagnosis not present

## 2016-10-03 DIAGNOSIS — Z1389 Encounter for screening for other disorder: Secondary | ICD-10-CM | POA: Diagnosis not present

## 2016-10-03 DIAGNOSIS — K219 Gastro-esophageal reflux disease without esophagitis: Secondary | ICD-10-CM | POA: Diagnosis not present

## 2016-10-03 DIAGNOSIS — N183 Chronic kidney disease, stage 3 (moderate): Secondary | ICD-10-CM | POA: Diagnosis not present

## 2016-10-18 ENCOUNTER — Other Ambulatory Visit: Payer: Self-pay | Admitting: Family Medicine

## 2016-10-18 DIAGNOSIS — Z1231 Encounter for screening mammogram for malignant neoplasm of breast: Secondary | ICD-10-CM

## 2016-10-19 ENCOUNTER — Telehealth: Payer: Self-pay | Admitting: *Deleted

## 2016-10-19 DIAGNOSIS — C8599 Non-Hodgkin lymphoma, unspecified, extranodal and solid organ sites: Secondary | ICD-10-CM

## 2016-10-19 MED ORDER — LORAZEPAM 0.5 MG PO TABS: 0.5000 mg | ORAL_TABLET | Freq: Two times a day (BID) | ORAL | 0 refills | Status: DC | PRN

## 2016-10-19 MED FILL — LORazepam 0.5 MG TABS: 0.5 | 30 days supply | Qty: 60 | Fill #0

## 2016-10-19 NOTE — Telephone Encounter (Signed)
Call from pt requesting refill on Lorazepam. Discussed with Drue Second, NP: Order received and called to pharmacy.

## 2016-11-19 DIAGNOSIS — J069 Acute upper respiratory infection, unspecified: Secondary | ICD-10-CM | POA: Diagnosis not present

## 2016-11-19 DIAGNOSIS — R05 Cough: Secondary | ICD-10-CM | POA: Diagnosis not present

## 2016-11-22 ENCOUNTER — Ambulatory Visit
Admission: RE | Admit: 2016-11-22 | Discharge: 2016-11-22 | Disposition: A | Discharge status: Home / Self Care | Payer: Medicare Other | Source: Ambulatory Visit | Attending: Family Medicine | Admitting: Family Medicine

## 2016-11-22 DIAGNOSIS — Z1231 Encounter for screening mammogram for malignant neoplasm of breast: Secondary | ICD-10-CM

## 2016-12-09 DIAGNOSIS — S6392XA Sprain of unspecified part of left wrist and hand, initial encounter: Secondary | ICD-10-CM | POA: Diagnosis not present

## 2017-01-09 DIAGNOSIS — H524 Presbyopia: Secondary | ICD-10-CM | POA: Diagnosis not present

## 2017-01-09 DIAGNOSIS — H52223 Regular astigmatism, bilateral: Secondary | ICD-10-CM | POA: Diagnosis not present

## 2017-01-09 DIAGNOSIS — H5203 Hypermetropia, bilateral: Secondary | ICD-10-CM | POA: Diagnosis not present

## 2017-06-29 ENCOUNTER — Other Ambulatory Visit: Payer: Self-pay | Admitting: Cardiology

## 2017-07-16 ENCOUNTER — Other Ambulatory Visit: Payer: Self-pay

## 2017-07-16 ENCOUNTER — Ambulatory Visit (HOSPITAL_BASED_OUTPATIENT_CLINIC_OR_DEPARTMENT_OTHER): Payer: 59

## 2017-07-16 ENCOUNTER — Ambulatory Visit: Payer: Medicare Other

## 2017-07-16 ENCOUNTER — Ambulatory Visit (HOSPITAL_BASED_OUTPATIENT_CLINIC_OR_DEPARTMENT_OTHER): Payer: 59 | Admitting: Oncology

## 2017-07-16 ENCOUNTER — Telehealth: Payer: Self-pay | Admitting: Oncology

## 2017-07-16 VITALS — BP 113/98 | HR 93 | Temp 97.7°F | Resp 17 | Ht 64.0 in | Wt 220.0 lb

## 2017-07-16 DIAGNOSIS — R5381 Other malaise: Secondary | ICD-10-CM | POA: Diagnosis not present

## 2017-07-16 DIAGNOSIS — I10 Essential (primary) hypertension: Secondary | ICD-10-CM | POA: Diagnosis not present

## 2017-07-16 DIAGNOSIS — Z853 Personal history of malignant neoplasm of breast: Secondary | ICD-10-CM

## 2017-07-16 DIAGNOSIS — R0609 Other forms of dyspnea: Principal | ICD-10-CM

## 2017-07-16 DIAGNOSIS — G4700 Insomnia, unspecified: Secondary | ICD-10-CM | POA: Diagnosis not present

## 2017-07-16 DIAGNOSIS — C8298 Follicular lymphoma, unspecified, lymph nodes of multiple sites: Secondary | ICD-10-CM | POA: Diagnosis not present

## 2017-07-16 DIAGNOSIS — N951 Menopausal and female climacteric states: Secondary | ICD-10-CM

## 2017-07-16 DIAGNOSIS — R300 Dysuria: Secondary | ICD-10-CM

## 2017-07-16 DIAGNOSIS — R0600 Dyspnea, unspecified: Secondary | ICD-10-CM

## 2017-07-16 LAB — URINALYSIS, MICROSCOPIC - CHCC
Bilirubin (Urine): NEGATIVE
Blood: NEGATIVE
GLUCOSE UR CHCC: NEGATIVE mg/dL
Ketones: NEGATIVE mg/dL
Leukocyte Esterase: NEGATIVE
NITRITE: NEGATIVE
RBC / HPF: NEGATIVE (ref 0–2)
Specific Gravity, Urine: 1.02 (ref 1.003–1.035)
UROBILINOGEN UR: 0.2 mg/dL (ref 0.2–1)
pH: 6 (ref 4.6–8.0)

## 2017-07-16 NOTE — Progress Notes (Signed)
  [facility] OFFICE PROGRESS NOTE   Diagnosis: Non-Hodgkin's lymphoma  INTERVAL HISTORY:   Crystal [name] returns as scheduled. She reports malaise. She feels that she may have a urinary tract infection at present. No fever. She has hot flashes in the evening. She reports insomnia. Good appetite. She is working. No change at the firm area near the right lumpectomy scar.  Objective:  Vital signs in last 24 hours:  Blood pressure (!) 113/98, pulse 93, temperature 97.7 F (36.5 C), temperature source Oral, resp. rate 17, height 5\' 4"  (1.626 m), weight 220 lb (99.8 kg), SpO2 100 %.    HEENT: Neck without mass Lymphatics: No cervical, supraclavicular, axillary, or inguinal nodes Resp: Lungs with rhonchi at the posterior bases bilaterally, no respiratory distress Cardio: Regular rate and rhythm GI: No hepatosplenomegaly, nontender Vascular: No leg edema Breasts: There is a superficial firm area superior to the right lumpectomy scar with surrounding firm induration. No mass in either breast. A bilateral mammogram was negative on 11/22/2016.  Medications: I have reviewed the patient's current medications.  Assessment/Plan: 1. Low-grade follicular lymphoma involving a right parotid mass, status post an excisional biopsy on 11/28/2010. 1. Staging CT scans 01/03/2011 confirmed an increased number of small nodes in the neck, left axilla and pelvis without clear evidence of pathologic lymphadenopathy. 2. PET scan 01/11/2011 confirmed hypermetabolic lymph nodes in the right cervical chain, left axillary nodes, periaortic, common iliac, external iliac and inguinal nodes. There was also a possible area of involvement at the right tonsillar region. 3. Palpable left posterior cervical nodes confirmed on exam 05/15/2013- progressive left neck nodes on exam 08/18/2013. 4. Status post cycle 1 bendamustine/Rituxan beginning 08/28/2013. 5. Near-complete resolution of left neck adenopathy on  exam 09/12/2013. 6. Status post cycle 2 bendamustine/Rituxan beginning 09/25/2013. 7. CT abdomen/pelvis 10/01/2013-near-complete response to therapy with isolated borderline enlarged left iliac node measuring 1.37 m. Previously identified right peritoneal right pelvic sidewall adenopathy is resolved. 8. Cycle 3 bendamustine/Rituxan beginning 10/28/2013. 9. Cycle 4 bendamustine/Rituxan beginning 11/25/2013. 10. Cycle 5 of bendamustine/Rituxan beginning 12/24/2013. 11. Cycle 6 bendamustine/rituximab 01/27/2014. 2. Stage I right-sided breast cancer diagnosed in 1998. 3. History of congestive heart failure. 4. Hypertension. 5. Port-A-Cath placement 08/25/2013 in interventional radiology. Removed 04/22/2014. 6. Chills during the Rituxan infusion 08/28/2013. She was given Solu-Medrol. Rituxan was resumed and completed. 7. Abdominal pain following cycle 2 bendamustine/Rituxan-no explanation for the pain on a CT 10/01/2013, resolved after starting Protonix. 8. Tachycardia 12/23/2013. Chest CT showed a pulmonary embolus. She completed 3 months of anticoagulation. 9. Chest CT 12/23/2013. Small nonocclusive right lower lobe pulmonary embolus. Minimal thrombus burden. No other emboli demonstrated. Xarelto initiated. Right upper extremity and bilateral lower extremity Dopplers negative on 12/25/2013.    Disposition:  [name] remains in clinical remission from non-Hodgkin's lymphoma. She will return for an office visit in one year. She will schedule a yearly mammogram. The firm area superior to the right lumpectomy scar likely reflects a suture granuloma and/or fat necrosis. She will have a urinalysis and urine culture today. 15 minutes were spent with the patient today. The majority of the time was used for counseling and coordination of care.  Donneta Romberg, MD  07/16/2017  1:54 PM

## 2017-07-16 NOTE — Telephone Encounter (Signed)
Gave avs and calendar for September 2019 °

## 2017-07-17 ENCOUNTER — Telehealth: Payer: Self-pay | Admitting: *Deleted

## 2017-07-17 LAB — URINE CULTURE

## 2017-07-17 NOTE — Telephone Encounter (Signed)
Call from pt requesting UA result. Reviewed with Dr. Benay Spice: Informed her it was negative for infection. She voiced understanding.

## 2017-08-03 ENCOUNTER — Ambulatory Visit: Payer: 59 | Admitting: Cardiology

## 2017-08-18 ENCOUNTER — Other Ambulatory Visit: Payer: Self-pay | Admitting: Nurse Practitioner

## 2017-09-03 ENCOUNTER — Other Ambulatory Visit: Payer: Self-pay | Admitting: Cardiology

## 2017-09-04 NOTE — Telephone Encounter (Signed)
Rx(s) sent to pharmacy electronically. MD OV 09/06/17

## 2017-09-06 ENCOUNTER — Encounter: Payer: Self-pay | Admitting: Cardiology

## 2017-09-06 ENCOUNTER — Ambulatory Visit (INDEPENDENT_AMBULATORY_CARE_PROVIDER_SITE_OTHER): Payer: 59 | Admitting: Cardiology

## 2017-09-06 VITALS — BP 108/78 | HR 89 | Ht 64.0 in | Wt 222.0 lb

## 2017-09-06 DIAGNOSIS — I428 Other cardiomyopathies: Secondary | ICD-10-CM

## 2017-09-06 DIAGNOSIS — I10 Essential (primary) hypertension: Secondary | ICD-10-CM | POA: Diagnosis not present

## 2017-09-06 DIAGNOSIS — E785 Hyperlipidemia, unspecified: Secondary | ICD-10-CM

## 2017-09-06 DIAGNOSIS — R0600 Dyspnea, unspecified: Secondary | ICD-10-CM

## 2017-09-06 DIAGNOSIS — E668 Other obesity: Secondary | ICD-10-CM | POA: Diagnosis not present

## 2017-09-06 DIAGNOSIS — R0609 Other forms of dyspnea: Secondary | ICD-10-CM

## 2017-09-06 NOTE — Progress Notes (Addendum)
PCP: Gaynelle Arabian, MD  Clinic Note: Chief Complaint  Patient presents with  . Follow-up    Annual follow-up history of cardiomyopathy -, resolved  . Hypertension    HPI: Crystal [name] is a 71 y.o. female with a PMH below who presents today for annual follow-up for cardiomyopathy thought to be related to chemotherapy.Marland Kitchen  Crystal [name] was last seen in October 2017 -overall doing well from cardiac standpoint.  Recent Hospitalizations: none  Studies Personally Reviewed - (if available, images/films reviewed: From Epic Chart or Care Everywhere)  none  Interval History: Crystal [name] presents today for annual follow-up feeling quite well without any major complaints.  She is still working at [employer] and walks quite a bit at work.  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 She never did fulfill-going to the [facility] when she went back to work.  She is trying to take advantage of having a reason to walk to get exercise.   No PND, orthopnea or edema.  No palpitations, lightheadedness, dizziness, weakness or syncope/near syncope. No TIA/amaurosis fugax symptoms. No claudication.  ROS: A comprehensive was performed. Review of Systems  Constitutional: Negative for malaise/fatigue.  HENT: Negative for nosebleeds.   Respiratory: Negative for cough and shortness of breath.   Gastrointestinal: Negative for abdominal pain, blood in stool, constipation and melena.  Genitourinary: Negative for hematuria.  Musculoskeletal: Positive for joint pain (Arthritis). Negative for falls.  Neurological: Positive for dizziness (Some postural dizziness).  Psychiatric/Behavioral: The patient has insomnia.   All other systems reviewed and are negative.   I have reviewed and (if needed) personally updated the patient's problem list, medications,  allergies, past medical and surgical history, social and family history.   Past Medical History:  Diagnosis Date  . Anxiety   . Breast cancer ([place]) 1998   (Rt) lumpectomy dx 1999; Dr. Benay Spice  . Dyslipidemia, goal LDL below 130   . Essential hypertension   . Lymphoma ([place])    lymphoma dx 11/28/10 - left neck  . non hodgkins lymphoma 12/2010  . Nonischemic cardiomyopathy ([place]) 2008   ? Doxorubincin induced; essentially resolved as of echo in January 2014, current EF 50-55%. Grade 1 diastolic dysfunction.  . Obesity   . Severe obesity (BMI >= 40) ([place]) 05/21/2013   Improve to BMI of 39 by July 2015    Past Surgical History:  Procedure Laterality Date  . ABDOMINAL HYSTERECTOMY    . BREAST LUMPECTOMY    . CARDIAC CATHETERIZATION  March 2009    None coronary disease, EF 45% (up from nuclear study EF of 30% in June '08)  . COLONOSCOPY    . DOPPLER ECHOCARDIOGRAPHY  January 2014   EF 50-55%, no regional wall motion and melena. Grade 1 diastolic dysfunction, abnormal relaxation. Normal pressures. Mild aortic sclerosis, no stenosis.  Marland Kitchen NM MYOVIEW LTD  03/29/2007   EF 33%,NEGATIVE ISCHEMIA, prob need cath  . NM MYOVIEW LTD  05/01/2014   Lexiscan: EF 55%. Normal wall motion; no ischemia or infarction; apical thinning  . PORTACATH PLACEMENT  August 25, 2013   rt. with tip in cavoatrial junction, Dr.Yamagata   . TRANSTHORACIC ECHOCARDIOGRAM  05/01/2014   Normal LV size with low normal function. EF of 50-55% and Gr 1 DD; aortic sclerosis without stenosis. MAC and thickening/calcification of the anterior leaflet - no  notable AI / AS or MR/MS    Current Meds  Medication Sig  . aspirin EC 81 MG tablet Take 81 mg by mouth every morning.  Marland Kitchen LORazepam (ATIVAN) 0.5 MG tablet Take 1 tablet (0.5 mg total) by mouth 2 (two) times daily as needed.  Marland Kitchen losartan-hydrochlorothiazide (HYZAAR) 50-12.5 MG per tablet Take 1 tablet by mouth every morning.   . metoprolol tartrate (LOPRESSOR) 50 MG tablet TAKE  1 TABLET TWICE DAILY    Allergies  Allergen Reactions  . Codeine Other (See Comments)    Bad Headaches  . Coreg Other (See Comments)    "Made my legs hurt"  . Lisinopril Cough    Social History   Socioeconomic History  . Marital status: Widowed    Spouse name: None  . Number of children: 3  . Years of education: hs  . Highest education level: None  Social Needs  . Financial resource strain: None  . Food insecurity - worry: None  . Food insecurity - inability: None  . Transportation needs - medical: None  . Transportation needs - non-medical: None  Occupational History  . Occupation: Retired  Tobacco Use  . Smoking status: Never Smoker  . Smokeless tobacco: Never Used  Substance and Sexual Activity  . Alcohol use: Yes    Comment: social   . Drug use: No  . Sexual activity: None  Other Topics Concern  . None  Social History Narrative   She is a widow mother of 2, with 1 child is deceased. She has 2 grandchildren. She is trying to get some walking in town and can do about 20-25 minutes his another 30 minutes she pushes a more night gets short of breath.   She is at retired [employer], he simply laid off after 45 years.   She usually comes accompanied by her sister, who is also my patient.   She never was a smoker, and only occasionally has a social alcoholic beverage.    family history includes Cancer in her paternal grandmother; Heart Problems in her father, maternal grandmother, mother, and paternal grandfather; Heart attack in her sister; Hypertension in her brother and sister; Stroke in her maternal grandfather.  Wt Readings from Last 3 Encounters:  09/06/17 222 lb (100.7 kg)  07/16/17 220 lb (99.8 kg)  07/24/16 220 lb 12.8 oz (100.2 kg)    PHYSICAL EXAM BP 108/78   Pulse 89   Ht _0  (1.626 m)   Wt 222 lb (100.7 kg)   BMI 38.11 kg/m  Physical Exam  Constitutional: She is oriented to person, place, and time. She appears well-developed and  well-nourished. No distress.  Moderate-borderline morbidly obese.  Well-groomed  Cardiovascular: Normal rate, regular rhythm, normal heart sounds and intact distal pulses.  Occasional extrasystoles are present. PMI is not displaced (Unable to palpate). Exam reveals no gallop and no friction rub.  No murmur heard. Pulmonary/Chest: Effort normal and breath sounds normal. No respiratory distress. She has no wheezes. She has no rales.  Abdominal: Soft. Bowel sounds are normal. She exhibits no distension. There is no tenderness. There is no rebound.  Musculoskeletal: Normal range of motion. She exhibits edema (trace).  Neurological: She is alert and oriented to person, place, and time.  Skin: Skin is warm and dry.  Varicose veins noted, but not engorged -- more like spider veins   Psychiatric: She has a normal mood and affect. Her behavior is normal. Judgment and thought content normal.  Nursing note and vitals  reviewed.   edema Trace,, varicose veins noted, but not engorged -- more like spider veins   Adult ECG Report  Rate: 89;  Rhythm: normal sinus rhythm and Left axis deviation (-38 degrees).  LVH.  Otherwise normal axis and durations and intervals.  ;   Narrative Interpretation: Stable EKG   Other studies Reviewed: Additional studies/ records that were reviewed today include:  Recent Labs:  n/a    ASSESSMENT / PLAN: Problem List Items Addressed This Visit    Dyslipidemia, goal LDL below 130 (Chronic)    Labs monitored by PCP.  She is not on any medications.Marland Kitchen      Dyspnea on exertion (Chronic)    Multifactorial.  Probably related to deconditioning and obesity.  Has had a negative cardiac evaluation, and is doing better now.  Most recent echo showed resolved cardiomyopathy      Relevant Orders   EKG 12-Lead (Completed)   Essential hypertension (Chronic)    Stable on losartan-HCTZ plus beta-blocker.      Moderate obesity (Chronic)    Likely playing a role in her dyspnea. The  patient understands the need to lose weight with diet and exercise. We have discussed specific strategies for this.      Nonischemic cardiomyopathy ([place]) - Primary (Chronic)    Essentially resolved.  Most recent echocardiogram showed EF is back to normal.  She still has some diastolic dysfunction, but I suspect her baseline dyspnea may be more related to deconditioning.  No longer on diuretic.  She is on stable dose of ARB-HCTZ as well as Lopressor.      Relevant Orders   EKG 12-Lead (Completed)      Current medicines are reviewed at length with the patient today. (+/- concerns) n/a The following changes have been made: n/a  Patient Instructions  NO CHANGES WITH CURRENT MEDICATIONS   Your physician wants you to follow-up in [place]. You will receive a reminder letter in the mail two months in advance. If you don't receive a letter, please call our office to schedule the follow-up appointment.   If you need a refill on your cardiac medications before your next appointment, please call your pharmacy.    Studies Ordered:   Orders Placed This Encounter  Procedures  . EKG 12-Lead      Glenetta Hew, M.D., M.S. Interventional Cardiologist   Pager # [phone] Phone # [phone] [address] [place], [place] [address]

## 2017-09-06 NOTE — Patient Instructions (Signed)
NO CHANGES WITH CURRENT MEDICATIONS   Your physician wants you to follow-up in [place]. You will receive a reminder letter in the mail two months in advance. If you don't receive a letter, please call our office to schedule the follow-up appointment.   If you need a refill on your cardiac medications before your next appointment, please call your pharmacy.

## 2017-09-07 ENCOUNTER — Ambulatory Visit: Payer: 59 | Admitting: Cardiology

## 2017-09-08 ENCOUNTER — Encounter: Payer: Self-pay | Admitting: Cardiology

## 2017-09-09 ENCOUNTER — Encounter: Payer: Self-pay | Admitting: Cardiology

## 2017-09-09 DIAGNOSIS — R600 Localized edema: Secondary | ICD-10-CM | POA: Insufficient documentation

## 2017-09-09 NOTE — Assessment & Plan Note (Addendum)
Essentially resolved.  Most recent echocardiogram showed EF is back to normal.  She still has some diastolic dysfunction, but I suspect her baseline dyspnea may be more related to deconditioning.  No longer on diuretic.  She is on stable dose of ARB-HCTZ as well as Lopressor.

## 2017-09-09 NOTE — Assessment & Plan Note (Signed)
Multifactorial.  Probably related to deconditioning and obesity.  Has had a negative cardiac evaluation, and is doing better now.  Most recent echo showed resolved cardiomyopathy

## 2017-09-09 NOTE — Assessment & Plan Note (Addendum)
Stable on losartan-HCTZ plus beta-blocker.

## 2017-09-09 NOTE — Assessment & Plan Note (Signed)
Likely playing a role in her dyspnea. The patient understands the need to lose weight with diet and exercise. We have discussed specific strategies for this.

## 2017-09-09 NOTE — Assessment & Plan Note (Signed)
Remains on anticoagulation with Xarelto.  Managed by hematology oncology

## 2017-09-09 NOTE — Assessment & Plan Note (Addendum)
Labs monitored by PCP.  She is not on any medications.Marland Kitchen

## 2017-09-09 NOTE — Assessment & Plan Note (Signed)
She is really taken mostly 2 tabs a day Lasix as opposed to 1.  We will change her prescription to 40 mg tablets.

## 2017-10-04 DIAGNOSIS — G4700 Insomnia, unspecified: Secondary | ICD-10-CM | POA: Diagnosis not present

## 2017-10-04 DIAGNOSIS — K589 Irritable bowel syndrome without diarrhea: Secondary | ICD-10-CM | POA: Diagnosis not present

## 2017-10-04 DIAGNOSIS — R7301 Impaired fasting glucose: Secondary | ICD-10-CM | POA: Diagnosis not present

## 2017-10-04 DIAGNOSIS — Z8572 Personal history of non-Hodgkin lymphomas: Secondary | ICD-10-CM | POA: Diagnosis not present

## 2017-10-04 DIAGNOSIS — N183 Chronic kidney disease, stage 3 (moderate): Secondary | ICD-10-CM | POA: Diagnosis not present

## 2017-10-04 DIAGNOSIS — K219 Gastro-esophageal reflux disease without esophagitis: Secondary | ICD-10-CM | POA: Diagnosis not present

## 2017-10-04 DIAGNOSIS — I700 Atherosclerosis of aorta: Secondary | ICD-10-CM | POA: Diagnosis not present

## 2017-10-04 DIAGNOSIS — F411 Generalized anxiety disorder: Secondary | ICD-10-CM | POA: Diagnosis not present

## 2017-10-04 DIAGNOSIS — I129 Hypertensive chronic kidney disease with stage 1 through stage 4 chronic kidney disease, or unspecified chronic kidney disease: Secondary | ICD-10-CM | POA: Diagnosis not present

## 2017-10-04 DIAGNOSIS — E7800 Pure hypercholesterolemia, unspecified: Secondary | ICD-10-CM | POA: Diagnosis not present

## 2017-10-24 ENCOUNTER — Other Ambulatory Visit: Payer: Self-pay | Admitting: Family Medicine

## 2017-10-24 DIAGNOSIS — Z1231 Encounter for screening mammogram for malignant neoplasm of breast: Secondary | ICD-10-CM

## 2017-11-05 ENCOUNTER — Other Ambulatory Visit: Payer: Self-pay | Admitting: Cardiology

## 2017-11-06 NOTE — Telephone Encounter (Signed)
REFILL 

## 2017-11-23 ENCOUNTER — Ambulatory Visit
Admission: RE | Admit: 2017-11-23 | Discharge: 2017-11-23 | Disposition: A | Discharge status: Home / Self Care | Payer: Medicare Other | Source: Ambulatory Visit | Attending: Family Medicine | Admitting: Family Medicine

## 2017-11-23 DIAGNOSIS — Z1231 Encounter for screening mammogram for malignant neoplasm of breast: Secondary | ICD-10-CM

## 2018-02-01 ENCOUNTER — Other Ambulatory Visit: Payer: Self-pay | Admitting: Nurse Practitioner

## 2018-02-11 ENCOUNTER — Telehealth: Payer: Self-pay | Admitting: Cardiology

## 2018-02-11 NOTE — Telephone Encounter (Signed)
Returned call to patient, patient reports having episode of CP yesterday.   Reports she had been to church and was at her daughters about to have lunch when she became clammy, diaphoretic, nauseous, and was having a severe pain in her chest.  States she was unsure if it was gas pain or heartburn but it concerned her.   States episode lasted about 15 mins and then passed.   She was sitting, not related to exertion.   Reports SOB during episode as well.  Denies pain or symptoms today at current.     Appt made for tomorrow 4/23 with Dr. Ellyn Hack at 140 pm.   Patient aware and verbalized understanding.   Advised if symptoms return or worsen to proceed to ER for evaluation.   Patient agreed.

## 2018-02-11 NOTE — Telephone Encounter (Signed)
New Message  Pt c/o of Chest Pain: STAT if CP now or developed within 24 hours  1. Are you having CP right now? no  2. Are you experiencing any other symptoms (ex. SOB, nausea, vomiting, sweating)? Clammy, sweating, nausea, and pain under breast bone  3. How long have you been experiencing CP? Happened yesterday then it passed  4. Is your CP continuous or coming and going? Coming and going  5. Have you taken Nitroglycerin? no ?

## 2018-02-12 ENCOUNTER — Encounter: Payer: Self-pay | Admitting: Cardiology

## 2018-02-12 ENCOUNTER — Ambulatory Visit (INDEPENDENT_AMBULATORY_CARE_PROVIDER_SITE_OTHER): Payer: Medicare Other | Admitting: Cardiology

## 2018-02-12 VITALS — BP 120/78 | HR 83 | Ht 65.0 in | Wt 221.8 lb

## 2018-02-12 DIAGNOSIS — R0609 Other forms of dyspnea: Secondary | ICD-10-CM

## 2018-02-12 DIAGNOSIS — I428 Other cardiomyopathies: Secondary | ICD-10-CM

## 2018-02-12 DIAGNOSIS — R0600 Dyspnea, unspecified: Secondary | ICD-10-CM

## 2018-02-12 DIAGNOSIS — I10 Essential (primary) hypertension: Secondary | ICD-10-CM

## 2018-02-12 DIAGNOSIS — R1011 Right upper quadrant pain: Secondary | ICD-10-CM

## 2018-02-12 DIAGNOSIS — E785 Hyperlipidemia, unspecified: Secondary | ICD-10-CM

## 2018-02-12 DIAGNOSIS — R079 Chest pain, unspecified: Secondary | ICD-10-CM | POA: Diagnosis not present

## 2018-02-12 NOTE — Patient Instructions (Addendum)
MEDICATION INSTRUCTION NO CHANGE  WITH CURRENT MEDICATION    TEST  WILL SCHEDULE  A RIGHT UPPER QUADRANT ABDOMEN ULTRASOUND   SCHEDULE AT [place] [address] Your physician has requested that you have en exercise stress myoview. For further information please visit [email or web address]. Please follow instruction sheet, as given.     Your physician recommends that you schedule a follow-up appointment in [place].  If you need a refill on your cardiac medications before your next appointment, please call your pharmacy.

## 2018-02-12 NOTE — Progress Notes (Signed)
PCP: Crystal Arabian, MD  Clinic Note: Chief Complaint  Patient presents with  . Follow-up  . Shortness of Breath    Exertional  . Chest Pain    Up underneath the ribs    HPI: Crystal [name] is a 72 y.o. female with a PMH below who presents today for annual follow-up for cardiomyopathy thought to be related to chemotherapy.Crystal [name]  Crystal [name] was last seen in November 2018 -overall doing well from cardiac standpoint.  Recent Hospitalizations: none  Studies Personally Reviewed - (if available, images/films reviewed: From Epic Chart or Care Everywhere)  none  Interval History: Crystal [name] presents today with several complaints most notable of which is significant exertional dyspnea with minimal exertion along with intermittent palpitations.  She used to be at a walk around the track and do routine activities, but now can barely walk around the track one time.  She had an episode though on Easter Sunday after lunch where she started feeling sharp stinging pain up underneath the rib cage radiating around both sides there was associate with nausea and clamminess dizziness.  It sort of radiated around to the back.  His overall lasted about 30 minutes.  This is when she noted some palpitations. She has been having some nausea and GERD symptoms since then and has been having to take Tums and Mylanta with minimal benefit.  She says that she has the GERD type symptoms both at rest and with exertion.  She definitely notes more discomfort in the right upper quadrant and left upper quadrant.  Up until this weekend she had been going to the [facility], and was doing exercises as well as walking on the track.  On the track.  She really denies any PND orthopnea with trace edema. She felt nauseated and dizzy during that episode on Sunday but did not really have true near syncope or syncope symptoms. No TIA/amaurosis fugax symptoms. No claudication.  ROS: A comprehensive was performed.Pertinent symptoms noted  in HPI Review of Systems  Constitutional: Positive for malaise/fatigue.  HENT: Negative for nosebleeds.   Respiratory: Positive for shortness of breath (Per HPI). Negative for cough.   Gastrointestinal: Positive for heartburn (Per HPI). Negative for abdominal pain, blood in stool, constipation and melena.  Genitourinary: Negative for hematuria.  Musculoskeletal: Positive for joint pain (Arthritis). Negative for falls.  Neurological: Positive for dizziness (Some postural dizziness).  Psychiatric/Behavioral: The patient has insomnia.   All other systems reviewed and are negative.   I have reviewed and (if needed) personally updated the patient's problem list, medications, allergies, past medical and surgical history, social and family history.   Past Medical History:  Diagnosis Date  . Anxiety   . Breast cancer ([place]) 1998   (Rt) lumpectomy dx 1999; Dr. Benay Spice  . Dyslipidemia, goal LDL below 130   . Essential hypertension   . Lymphoma ([place])    lymphoma dx 11/28/10 - left neck  . non hodgkins lymphoma 12/2010  . Nonischemic cardiomyopathy ([place]) 2008   ? Doxorubincin induced; essentially resolved as of echo in January 2014, current EF 50-55%. Grade 1 diastolic dysfunction.  . Obesity   . Severe obesity (BMI >= 40) ([place]) 05/21/2013   Improve to BMI of 39 by July 2015    Past Surgical History:  Procedure Laterality Date  . ABDOMINAL HYSTERECTOMY    . BREAST EXCISIONAL BIOPSY Left   . BREAST EXCISIONAL BIOPSY Right   . BREAST LUMPECTOMY Right 1998  . CARDIAC CATHETERIZATION  March 2009  None coronary disease, EF 45% (up from nuclear study EF of 30% in June '08)  . COLONOSCOPY    . DOPPLER ECHOCARDIOGRAPHY  January 2014   EF 50-55%, no regional wall motion and melena. Grade 1 diastolic dysfunction, abnormal relaxation. Normal pressures. Mild aortic sclerosis, no stenosis.  Crystal [name] NM MYOVIEW LTD  03/29/2007   EF 33%,NEGATIVE ISCHEMIA, prob need cath  . NM MYOVIEW LTD  05/01/2014    Lexiscan: EF 55%. Normal wall motion; no ischemia or infarction; apical thinning  . PORTACATH PLACEMENT  August 25, 2013   rt. with tip in cavoatrial junction, Dr.Yamagata   . TRANSTHORACIC ECHOCARDIOGRAM  05/01/2014   Normal LV size with low normal function. EF of 50-55% and Gr 1 DD; aortic sclerosis without stenosis. MAC and thickening/calcification of the anterior leaflet - no notable AI / AS or MR/MS    Current Meds  Medication Sig  . aspirin EC 81 MG tablet Take 81 mg by mouth every morning.  Crystal [name] LORazepam (ATIVAN) 0.5 MG tablet Take 1 tablet (0.5 mg total) by mouth 2 (two) times daily as needed.  Crystal [name] losartan-hydrochlorothiazide (HYZAAR) 50-12.5 MG per tablet Take 1 tablet by mouth every morning.   . metoprolol tartrate (LOPRESSOR) 50 MG tablet TAKE 1 TABLET TWICE DAILY    Allergies  Allergen Reactions  . Codeine Other (See Comments)    Bad Headaches  . Coreg Other (See Comments)    "Made my legs hurt"  . Lisinopril Cough    Social History   Tobacco Use  . Smoking status: Never Smoker  . Smokeless tobacco: Never Used  Substance Use Topics  . Alcohol use: Yes    Comment: social   . Drug use: No   Social History   Social History Narrative   She is a widow mother of 2, with 1 child is deceased. She has 2 grandchildren. She is trying to get some walking in town and can do about 20-25 minutes his another 30 minutes she pushes a more night gets short of breath.   She is at retired [employer], he simply laid off after 45 years.   She usually comes accompanied by her sister, who is also my patient.   She never was a smoker, and only occasionally has a social alcoholic beverage.     family history includes Cancer in her paternal grandmother; Heart Problems in her father, maternal grandmother, mother, and paternal grandfather; Heart attack in her sister; Hypertension in her brother and sister; Stroke in her maternal grandfather.  Wt Readings from Last 3 Encounters:    02/12/18 221 lb 12.8 oz (100.6 kg)  09/06/17 222 lb (100.7 kg)  07/16/17 220 lb (99.8 kg)    PHYSICAL EXAM BP 120/78   Pulse 83   Ht 5' 5"  (1.651 m)   Wt 221 lb 12.8 oz (100.6 kg)   BMI 36.91 kg/m   Physical Exam  Constitutional: She is oriented to person, place, and time. She appears well-developed and well-nourished. No distress.  Moderate-borderline morbidly obese.  Well-groomed  Cardiovascular: Normal rate, regular rhythm, normal heart sounds and intact distal pulses.  Occasional extrasystoles are present. PMI is not displaced (Unable to palpate). Exam reveals no gallop and no friction rub.  No murmur heard. Pulmonary/Chest: Effort normal and breath sounds normal. No respiratory distress. She has no wheezes. She has no rales.  Abdominal: Soft. Bowel sounds are normal. She exhibits no distension. There is no tenderness. There is no rebound.  Musculoskeletal: Normal range of  motion. She exhibits edema (trace).  Neurological: She is alert and oriented to person, place, and time.  Skin: Skin is warm and dry.  Varicose veins noted, but not engorged -- more like spider veins   Psychiatric: She has a normal mood and affect. Her behavior is normal. Judgment and thought content normal.  Nursing note and vitals reviewed.   Adult ECG Report  Rate: 83;  Rhythm: normal sinus rhythm and Normal axis, intervals and durations.  Cannot exclude septal infarct, age undetermined.; no longer consistent with LVH  Narrative Interpretation: Stable EKG   Other studies Reviewed: Additional studies/ records that were reviewed today include:  Recent Labs:  n/a    ASSESSMENT / PLAN: Problem List Items Addressed This Visit    Right upper quadrant pain    She is notingAlmost more right upper quadrant pain with tenderness to palpation which makes me concern for possible gallbladder disease.  We will continue the cardiology evaluation, but will also check right upper quadrant ultrasound to exclude  gallbladder disease.      Relevant Orders   US ABDOMEN LIMITED RUQ   Nonischemic cardiomyopathy ([place]) (Chronic)    History of nonischemic cardiomyopathy that was presumably resolved with follow-up echo showing EF back to baseline, however now she is noticing exertional dyspnea which could simply be related to deconditioning and diastolic dysfunction, however this seems to be relatively new onset, with her having come along way with doing baseline exercise. She is on losartan and metoprolol tartrate and seems euvolemic on exam. With her episode of epigastric chest discomfort this past weekend and now having exertional dyspnea, need to exclude recurrence of cardiomyopathy as well as potential ischemia. Plan: Myoview stress test with potential echocardiogram depending on what the EF looks like.      Relevant Orders   EKG 12-Lead (Completed)   MYOCARDIAL PERFUSION IMAGING   Essential hypertension (Chronic)    Stable blood pressures on current medications.  No change for now.      Dyspnea on exertion (Chronic)    She has had this in the past, but seems to be much worse now.  Her history of cardiomyopathy we need to exclude recurrence and need to exclude ischemia with a Myoview.  This will also give [place] a sense of the EF.  We could check an echocardiogram depending what this looks like.      Relevant Orders   EKG 12-Lead (Completed)   MYOCARDIAL PERFUSION IMAGING   US ABDOMEN LIMITED RUQ   Dyslipidemia, goal LDL below 130 (Chronic)    Not on statin.  Labs monitored by PCP.  Depending on what the stress test shows, may need to be more aggressive.      Chest pain with moderate risk for cardiac etiology - Primary    The nausea and shakiness and jitteriness as well as dyspnea associated with her what sounds like epigastric discomfort is concerning for possible RCA Ischemia.  More because of the exertional dyspnea, I think it is prudent to exclude ischemia. Plan: Check Myoview stress test.  I  would like to see her exercise tolerance, but suspect that she may need to switch to [place]..      Relevant Orders   EKG 12-Lead (Completed)   MYOCARDIAL PERFUSION IMAGING   US ABDOMEN LIMITED RUQ      I spent a total of 40 minutes on chart review and direct patient care. >  50% of the time was spent in direct patient consultation discussing her symptoms and  plan of action.  Current medicines are reviewed at length with the patient today. (+/- concerns) n/a The following changes have been made: n/a  Patient Instructions  MEDICATION INSTRUCTION NO CHANGE  WITH CURRENT MEDICATION    TEST  WILL SCHEDULE  A RIGHT UPPER QUADRANT ABDOMEN ULTRASOUND   SCHEDULE AT [place] [address] Your physician has requested that you have en exercise stress myoview. For further information please visit [email or web address]. Please follow instruction sheet, as given.     Your physician recommends that you schedule a follow-up appointment in [place].  If you need a refill on your cardiac medications before your next appointment, please call your pharmacy.      Studies Ordered:   Orders Placed This Encounter  Procedures  . US ABDOMEN LIMITED RUQ  . MYOCARDIAL PERFUSION IMAGING  . EKG 12-Lead      Glenetta Hew, M.D., M.S. Interventional Cardiologist   Pager # [phone] Phone # [phone] [address] [place], [place] [address]

## 2018-02-14 ENCOUNTER — Encounter: Payer: Self-pay | Admitting: Cardiology

## 2018-02-14 DIAGNOSIS — R079 Chest pain, unspecified: Secondary | ICD-10-CM | POA: Insufficient documentation

## 2018-02-14 NOTE — Assessment & Plan Note (Signed)
The nausea and shakiness and jitteriness as well as dyspnea associated with her what sounds like epigastric discomfort is concerning for possible RCA Ischemia.  More because of the exertional dyspnea, I think it is prudent to exclude ischemia. Plan: Check Myoview stress test.  I would like to see her exercise tolerance, but suspect that she may need to switch to [place].Marland Kitchen

## 2018-02-14 NOTE — Assessment & Plan Note (Signed)
Not on statin.  Labs monitored by PCP.  Depending on what the stress test shows, may need to be more aggressive.

## 2018-02-14 NOTE — Assessment & Plan Note (Signed)
Stable blood pressures on current medications.  No change for now.

## 2018-02-14 NOTE — Assessment & Plan Note (Signed)
She is notingAlmost more right upper quadrant pain with tenderness to palpation which makes me concern for possible gallbladder disease.  We will continue the cardiology evaluation, but will also check right upper quadrant ultrasound to exclude gallbladder disease.

## 2018-02-14 NOTE — Assessment & Plan Note (Addendum)
History of nonischemic cardiomyopathy that was presumably resolved with follow-up echo showing EF back to baseline, however now she is noticing exertional dyspnea which could simply be related to deconditioning and diastolic dysfunction, however this seems to be relatively new onset, with her having come along way with doing baseline exercise. She is on losartan and metoprolol tartrate and seems euvolemic on exam. With her episode of epigastric chest discomfort this past weekend and now having exertional dyspnea, need to exclude recurrence of cardiomyopathy as well as potential ischemia. Plan: Myoview stress test with potential echocardiogram depending on what the EF looks like.

## 2018-02-14 NOTE — Assessment & Plan Note (Addendum)
She has had this in the past, but seems to be much worse now.  Her history of cardiomyopathy we need to exclude recurrence and need to exclude ischemia with a Myoview.  This will also give [place] a sense of the EF.  We could check an echocardiogram depending what this looks like.

## 2018-02-20 ENCOUNTER — Encounter (HOSPITAL_COMMUNITY): Payer: Medicare Other

## 2018-02-20 ENCOUNTER — Other Ambulatory Visit: Payer: Medicare Other

## 2018-03-21 ENCOUNTER — Ambulatory Visit: Payer: Medicare Other | Admitting: Cardiology

## 2018-06-21 ENCOUNTER — Telehealth: Payer: Self-pay

## 2018-06-21 NOTE — Telephone Encounter (Signed)
Printed patient a copy of her current calender. Per 8/30 walk in

## 2018-06-30 ENCOUNTER — Emergency Department (HOSPITAL_COMMUNITY)
Admission: EM | Admit: 2018-06-30 | Discharge: 2018-06-30 | Disposition: A | Discharge status: Home / Self Care | Payer: Medicare Other | Attending: Emergency Medicine | Admitting: Emergency Medicine

## 2018-06-30 ENCOUNTER — Encounter (HOSPITAL_COMMUNITY): Payer: Self-pay | Admitting: Emergency Medicine

## 2018-06-30 ENCOUNTER — Emergency Department (HOSPITAL_COMMUNITY): Payer: Medicare Other

## 2018-06-30 ENCOUNTER — Other Ambulatory Visit: Payer: Self-pay

## 2018-06-30 DIAGNOSIS — R109 Unspecified abdominal pain: Secondary | ICD-10-CM | POA: Diagnosis not present

## 2018-06-30 DIAGNOSIS — Z79899 Other long term (current) drug therapy: Secondary | ICD-10-CM | POA: Diagnosis not present

## 2018-06-30 DIAGNOSIS — K449 Diaphragmatic hernia without obstruction or gangrene: Secondary | ICD-10-CM | POA: Diagnosis not present

## 2018-06-30 DIAGNOSIS — Z7982 Long term (current) use of aspirin: Secondary | ICD-10-CM | POA: Insufficient documentation

## 2018-06-30 DIAGNOSIS — I10 Essential (primary) hypertension: Secondary | ICD-10-CM | POA: Insufficient documentation

## 2018-06-30 DIAGNOSIS — Z853 Personal history of malignant neoplasm of breast: Secondary | ICD-10-CM | POA: Insufficient documentation

## 2018-06-30 DIAGNOSIS — Z8572 Personal history of non-Hodgkin lymphomas: Secondary | ICD-10-CM | POA: Insufficient documentation

## 2018-06-30 DIAGNOSIS — R1012 Left upper quadrant pain: Secondary | ICD-10-CM | POA: Diagnosis not present

## 2018-06-30 DIAGNOSIS — R110 Nausea: Secondary | ICD-10-CM | POA: Diagnosis not present

## 2018-06-30 LAB — URINALYSIS, ROUTINE W REFLEX MICROSCOPIC
Bacteria, UA: NONE SEEN
Bilirubin Urine: NEGATIVE
GLUCOSE, UA: NEGATIVE mg/dL
Ketones, ur: NEGATIVE mg/dL
NITRITE: NEGATIVE
PH: 6 (ref 5.0–8.0)
Protein, ur: NEGATIVE mg/dL
SPECIFIC GRAVITY, URINE: 1.018 (ref 1.005–1.030)

## 2018-06-30 LAB — CBC
HCT: 43.1 % (ref 36.0–46.0)
Hemoglobin: 14.1 g/dL (ref 12.0–15.0)
MCH: 29.3 pg (ref 26.0–34.0)
MCHC: 32.7 g/dL (ref 30.0–36.0)
MCV: 89.6 fL (ref 78.0–100.0)
Platelets: 233 10*3/uL (ref 150–400)
RBC: 4.81 MIL/uL (ref 3.87–5.11)
RDW: 14.2 % (ref 11.5–15.5)
WBC: 4.3 10*3/uL (ref 4.0–10.5)

## 2018-06-30 LAB — COMPREHENSIVE METABOLIC PANEL
ALBUMIN: 3.6 g/dL (ref 3.5–5.0)
ALT: 17 U/L (ref 0–44)
AST: 24 U/L (ref 15–41)
Alkaline Phosphatase: 79 U/L (ref 38–126)
Anion gap: 9 (ref 5–15)
BILIRUBIN TOTAL: 0.6 mg/dL (ref 0.3–1.2)
BUN: 17 mg/dL (ref 8–23)
CO2: 26 mmol/L (ref 22–32)
Calcium: 9.1 mg/dL (ref 8.9–10.3)
Chloride: 105 mmol/L (ref 98–111)
Creatinine, Ser: 1.14 mg/dL — ABNORMAL HIGH (ref 0.44–1.00)
GFR calc Af Amer: 54 mL/min — ABNORMAL LOW (ref >60)
GFR calc non Af Amer: 47 mL/min — ABNORMAL LOW (ref >60)
GLUCOSE: 113 mg/dL — AB (ref 70–99)
POTASSIUM: 3.5 mmol/L (ref 3.5–5.1)
SODIUM: 140 mmol/L (ref 135–145)
TOTAL PROTEIN: 7.6 g/dL (ref 6.5–8.1)

## 2018-06-30 LAB — I-STAT TROPONIN, ED: Troponin i, poc: 0.02 ng/mL (ref 0.00–0.08)

## 2018-06-30 LAB — LIPASE, BLOOD: LIPASE: 44 U/L (ref 11–51)

## 2018-06-30 MED ORDER — SODIUM CHLORIDE 0.9 % IV BOLUS
500.0000 mL | Freq: Once | INTRAVENOUS | Status: AC
  Administered 2018-06-30: 500 mL via INTRAVENOUS

## 2018-06-30 MED ORDER — KETOROLAC TROMETHAMINE 30 MG/ML IJ SOLN
30.0000 mg | Freq: Once | INTRAMUSCULAR | Status: AC
  Administered 2018-06-30: 30 mg via INTRAVENOUS
  Filled 2018-06-30: qty 1

## 2018-06-30 MED ORDER — PANTOPRAZOLE SODIUM 20 MG PO TBEC: 20.0000 mg | DELAYED_RELEASE_TABLET | Freq: Two times a day (BID) | ORAL | 0 refills | Status: DC

## 2018-06-30 MED ORDER — ONDANSETRON HCL 4 MG/2ML IJ SOLN
4.0000 mg | Freq: Once | INTRAMUSCULAR | Status: AC
  Administered 2018-06-30: 4 mg via INTRAVENOUS
  Filled 2018-06-30: qty 2

## 2018-06-30 NOTE — ED Triage Notes (Signed)
Pt with LUQ pain x 3 days. Denies N/V/D. Has not had any meds for pain.

## 2018-06-30 NOTE — ED Provider Notes (Signed)
[facility] DEPT Provider Note   CSN: [id number] Arrival date & time: 06/30/18  2725     History   Chief Complaint Chief Complaint  Patient presents with  . Abdominal Pain    LUQ    HPI Crystal [name] is a 72 y.o. female.  HPI   Presents with left sided pain, began more severely on Tuesday (reports had some stomach pain last week which improved).  Feels like a sharp pain, tried to sit up, walk, helped it to feel better.  Radiates into back.  Nausea, no vomiting. No diarrhea, feel constipated but is still going.  No dysuria, no hematuria.  No fevers.  Shortness of breath with walking which is chronic, sees Dr. Ellyn Hack, and sees nonhodgkins lymphoma dr. Benay Spice.     Past Medical History:  Diagnosis Date  . Anxiety   . Breast cancer ([place]) 1998   (Rt) lumpectomy dx 1999; Dr. Benay Spice  . Dyslipidemia, goal LDL below 130   . Essential hypertension   . Lymphoma ([place])    lymphoma dx 11/28/10 - left neck  . non hodgkins lymphoma 12/2010  . Nonischemic cardiomyopathy ([place]) 2008   ? Doxorubincin induced; essentially resolved as of echo in January 2014, current EF 50-55%. Grade 1 diastolic dysfunction.  . Obesity   . Severe obesity (BMI >= 40) ([place]) 05/21/2013   Improve to BMI of 39 by July 2015    Patient Active Problem List   Diagnosis Date Noted  . Chest pain with moderate risk for cardiac etiology 02/14/2018  . Right upper quadrant pain 02/12/2018  . Tremor 02/11/2014  . Other malignant lymphomas, unspecified site, extranodal and solid organ sites 08/18/2013  . Neck pain on left side 05/30/2013  . Dizziness 05/30/2013  . Moderate obesity 05/21/2013  . Lung crackles 05/21/2013  . Dyspnea on exertion 05/21/2013  . Nonischemic cardiomyopathy ([place])   . Essential hypertension   . Dyslipidemia, goal LDL below 130     Past Surgical History:  Procedure Laterality Date  . ABDOMINAL HYSTERECTOMY    . BREAST EXCISIONAL BIOPSY Left   . BREAST  EXCISIONAL BIOPSY Right   . BREAST LUMPECTOMY Right 1998  . CARDIAC CATHETERIZATION  March 2009    None coronary disease, EF 45% (up from nuclear study EF of 30% in June '08)  . COLONOSCOPY    . DOPPLER ECHOCARDIOGRAPHY  January 2014   EF 50-55%, no regional wall motion and melena. Grade 1 diastolic dysfunction, abnormal relaxation. Normal pressures. Mild aortic sclerosis, no stenosis.  Marland Kitchen NM MYOVIEW LTD  03/29/2007   EF 33%,NEGATIVE ISCHEMIA, prob need cath  . NM MYOVIEW LTD  05/01/2014   Lexiscan: EF 55%. Normal wall motion; no ischemia or infarction; apical thinning  . PORTACATH PLACEMENT  August 25, 2013   rt. with tip in cavoatrial junction, Dr.Yamagata   . TRANSTHORACIC ECHOCARDIOGRAM  05/01/2014   Normal LV size with low normal function. EF of 50-55% and Gr 1 DD; aortic sclerosis without stenosis. MAC and thickening/calcification of the anterior leaflet - no notable AI / AS or MR/MS     OB History   None      Home Medications    Prior to Admission medications   Medication Sig Start Date End Date Taking? Authorizing Provider  aspirin EC 81 MG tablet Take 81 mg by mouth every morning.   Yes [provider]  ibuprofen (ADVIL,MOTRIN) 200 MG tablet Take 800 mg by mouth every 6 (six) hours as needed for  mild pain.   Yes [provider]  LORazepam (ATIVAN) 0.5 MG tablet Take 1 tablet (0.5 mg total) by mouth 2 (two) times daily as needed. 10/19/16  Yes Susanne Borders, NP  losartan-hydrochlorothiazide (HYZAAR) 50-12.5 MG per tablet Take 1 tablet by mouth every morning.    Yes [provider]  metoprolol tartrate (LOPRESSOR) 50 MG tablet TAKE 1 TABLET TWICE DAILY 11/06/17  Yes Leonie Man, MD  pantoprazole (PROTONIX) 20 MG tablet Take 1 tablet (20 mg total) by mouth 2 (two) times daily for 14 days. 06/30/18 07/14/18  Gareth Morgan, MD    Family History Family History  Problem Relation Age of Onset  . Heart Problems Mother        CABG  . Heart  Problems Father   . Heart Problems Maternal Grandmother   . Stroke Maternal Grandfather   . Cancer Paternal Grandmother        stomach  . Heart Problems Paternal Grandfather   . Hypertension Brother        and lupus  . Heart attack Sister   . Hypertension Sister        x2    Social History Social History   Tobacco Use  . Smoking status: Never Smoker  . Smokeless tobacco: Never Used  Substance Use Topics  . Alcohol use: Yes    Comment: social   . Drug use: No     Allergies   Codeine; Coreg; and Lisinopril   Review of Systems Review of Systems  Constitutional: Negative for fever.  HENT: Negative for sore throat.   Eyes: Negative for visual disturbance.  Respiratory: Negative for cough and shortness of breath.   Cardiovascular: Negative for chest pain.  Gastrointestinal: Positive for abdominal pain and nausea. Negative for constipation, diarrhea and vomiting.  Genitourinary: Positive for flank pain. Negative for difficulty urinating.  Musculoskeletal: Negative for back pain and neck pain.  Skin: Negative for rash.  Neurological: Negative for syncope and headaches.     Physical Exam Updated Vital Signs BP (!) 135/93   Pulse 87   Temp 98.6 F (37 C) (Oral)   Resp 17   SpO2 95%   Physical Exam  Constitutional: She is oriented to person, place, and time. She appears well-developed and well-nourished. No distress.  HENT:  Head: Normocephalic and atraumatic.  Eyes: Conjunctivae and EOM are normal.  Neck: Normal range of motion.  Cardiovascular: Normal rate, regular rhythm, normal heart sounds and intact distal pulses. Exam reveals no gallop and no friction rub.  No murmur heard. Pulmonary/Chest: Effort normal and breath sounds normal. No respiratory distress. She has no wheezes. She has no rales.  Abdominal: Soft. She exhibits no distension. There is no tenderness. There is CVA tenderness (left). There is no guarding.  Musculoskeletal: She exhibits no edema or  tenderness.  Neurological: She is alert and oriented to person, place, and time.  Skin: Skin is warm and dry. No rash noted. She is not diaphoretic. No erythema.  Nursing note and vitals reviewed.    ED Treatments / Results  Labs (all labs ordered are listed, but only abnormal results are displayed) Labs Reviewed  COMPREHENSIVE METABOLIC PANEL - Abnormal; Notable for the following components:      Result Value   Glucose, Bld 113 (*)    Creatinine, Ser 1.14 (*)    GFR calc non Af Amer 47 (*)    GFR calc Af Amer 54 (*)    All other components within normal limits  URINALYSIS, ROUTINE W REFLEX MICROSCOPIC - Abnormal; Notable for the following components:   Hgb urine dipstick SMALL (*)    Leukocytes, UA TRACE (*)    All other components within normal limits  LIPASE, BLOOD  CBC  I-STAT TROPONIN, ED    EKG EKG Interpretation  Date/Time:  _69  year old female with history above presents with concern for left upper quadrant and left flank pain.  Urinalysis shows no sign of UTI.  Labs show no significant change.  Patient tachycardic on arrival, with EKG showing sinus rhythm without acute changes.  Have low suspicion the pain described represents pulmonary embolus given normal oxygen saturation, no tachypnea, no hypoxia,  and it provement of heart rate as patient relaxed in the emergency department. Troponin negative. Lipase WNL.  CT stone study was completed which showed no acute findings.  Discussed possibility of gastritis or ulcer disease. Pt feels improved in the ED. Placed on Protonix. Patient discharged in stable condition with understanding of reasons to return.  Final Clinical Impressions(s) / ED Diagnoses   Final diagnoses:  Left sided abdominal pain    ED Discharge Orders         Ordered    pantoprazole (PROTONIX) 20 MG tablet  2  times daily     06/30/18 1221           Gareth Morgan, MD 06/30/18 2120

## 2018-07-01 MED FILL — PANTOPRAZOLE SOD DR 20 MG T: 20 | 14 days supply | Qty: 28 | Fill #0

## 2018-07-15 DIAGNOSIS — Z23 Encounter for immunization: Secondary | ICD-10-CM | POA: Diagnosis not present

## 2018-07-15 DIAGNOSIS — R1032 Left lower quadrant pain: Secondary | ICD-10-CM | POA: Diagnosis not present

## 2018-07-16 ENCOUNTER — Other Ambulatory Visit: Payer: 59

## 2018-07-16 ENCOUNTER — Ambulatory Visit: Payer: 59 | Admitting: Oncology

## 2018-07-29 ENCOUNTER — Inpatient Hospital Stay: Payer: Medicare Other | Attending: Oncology

## 2018-07-29 ENCOUNTER — Inpatient Hospital Stay (HOSPITAL_BASED_OUTPATIENT_CLINIC_OR_DEPARTMENT_OTHER): Payer: Medicare Other | Admitting: Oncology

## 2018-07-29 VITALS — BP 111/69 | HR 100 | Temp 98.1°F | Resp 18 | Ht 65.0 in | Wt 223.1 lb

## 2018-07-29 DIAGNOSIS — Z86711 Personal history of pulmonary embolism: Secondary | ICD-10-CM

## 2018-07-29 DIAGNOSIS — C8298 Follicular lymphoma, unspecified, lymph nodes of multiple sites: Secondary | ICD-10-CM | POA: Diagnosis not present

## 2018-07-29 DIAGNOSIS — Z853 Personal history of malignant neoplasm of breast: Secondary | ICD-10-CM | POA: Diagnosis not present

## 2018-07-29 DIAGNOSIS — C8599 Non-Hodgkin lymphoma, unspecified, extranodal and solid organ sites: Secondary | ICD-10-CM

## 2018-07-29 DIAGNOSIS — I10 Essential (primary) hypertension: Secondary | ICD-10-CM

## 2018-07-29 NOTE — Progress Notes (Signed)
Crystal [name]   Diagnosis: Non-Hodgkin's lymphoma  INTERVAL HISTORY:   Crystal [name] returns as scheduled.  well.  No fever or night sweats.  She was seen in the emergency room 2019 with left abdominal pain.  A CT renal stone study acute finding.  The pain resolved.  Objective:  Vital signs in last 24 hours:  Blood pressure 111/69, pulse 100, temperature 98.1 F (36.7 C), temperature source Oral, resp. rate 18, height 5\' 5"  (1.651 m), weight 223 lb 1.6 oz (101.2 kg), SpO2 98 %.    HEENT: Neck without mass Lymphatics: No cervical, supraclavicular, axillary, or inguinal nodes Resp: Inspiratory rhonchi at the posterior base bilaterally, no respiratory distress Cardio: Regular rate and rhythm GI: No mass, no hepatosplenomegaly Vascular: No leg edema  Lab Results:  Lab Results  Component Value Date   WBC 4.3 06/30/2018   HGB 14.1 06/30/2018   HCT 43.1 06/30/2018   MCV 89.6 06/30/2018   PLT 233 06/30/2018   NEUTROABS 1.9 06/30/2014    CMP  Lab Results  Component Value Date   NA 140 06/30/2018   K 3.5 06/30/2018   CL 105 06/30/2018   CO2 26 06/30/2018   GLUCOSE 113 (H) 06/30/2018   BUN 17 06/30/2018   CREATININE 1.14 (H) 06/30/2018   CALCIUM 9.1 06/30/2018   PROT 7.6 06/30/2018   ALBUMIN 3.6 06/30/2018   AST 24 06/30/2018   ALT 17 06/30/2018   ALKPHOS 79 06/30/2018   BILITOT 0.6 06/30/2018   GFRNONAA 47 (L) 06/30/2018   GFRAA 54 (L) 06/30/2018     Medications: I have reviewed the patient's current medications.   Assessment/Plan: 1. Low-grade follicular lymphoma involving a right parotid mass, status post an excisional biopsy on 11/28/2010. 1. Staging CT scans 01/03/2011 confirmed an increased number of small nodes in the neck, left axilla and pelvis without clear evidence of pathologic lymphadenopathy. 2. PET scan 01/11/2011 confirmed hypermetabolic lymph nodes in the right cervical chain, left axillary nodes, periaortic, common  iliac, external iliac and inguinal nodes. There was also a possible area of involvement at the right tonsillar region. 3. Palpable left posterior cervical nodes confirmed on exam 05/15/2013- progressive left neck nodes on exam 08/18/2013. 4. Status post cycle 1 bendamustine/Rituxan beginning 08/28/2013. 5. Near-complete resolution of left neck adenopathy on exam 09/12/2013. 6. Status post cycle 2 bendamustine/Rituxan beginning 09/25/2013. 7. CT abdomen/pelvis 10/01/2013-near-complete response to therapy with isolated borderline enlarged left iliac node measuring 1.37 m. Previously identified right peritoneal right pelvic sidewall adenopathy is resolved. 8. Cycle 3 bendamustine/Rituxan beginning 10/28/2013. 9. Cycle 4 bendamustine/Rituxan beginning 11/25/2013. 10. Cycle 5 of bendamustine/Rituxan beginning 12/24/2013. 11. Cycle 6 bendamustine/rituximab 01/27/2014. 2. Stage I right-sided breast cancer diagnosed in 1998. 3. History of congestive heart failure. 4. Hypertension. 5. Port-A-Cath placement 08/25/2013 in interventional radiology. Removed 04/22/2014. 6. Chills during the Rituxan infusion 08/28/2013. She was given Solu-Medrol. Rituxan was resumed and completed. 7. Abdominal pain following cycle 2 bendamustine/Rituxan-no explanation for the pain on a CT 10/01/2013, resolved after starting Protonix. 8. Tachycardia 12/23/2013. Chest CT showed a pulmonary embolus. She completed 3 months of anticoagulation. 9. Chest CT 12/23/2013. Small nonocclusive right lower lobe pulmonary embolus. Minimal thrombus burden. No other emboli demonstrated. Xarelto initiated. Right upper extremity and bilateral lower extremity Dopplers negative on 12/25/2013.      Disposition: Crystal [name] is in clinical remission from non-Hodgkin's lymphoma.  She would like to continue follow-up at the Cancer center.  She will return for an office visit  in 1 year.  15 minutes were spent with the patient today.  The majority of  the time used for counseling and coordination of care.  [name], MD  07/29/2018  3:19 PM

## 2018-07-30 ENCOUNTER — Other Ambulatory Visit: Payer: Self-pay | Admitting: Oncology

## 2018-07-30 DIAGNOSIS — C8599 Non-Hodgkin lymphoma, unspecified, extranodal and solid organ sites: Secondary | ICD-10-CM

## 2018-07-31 ENCOUNTER — Other Ambulatory Visit: Payer: Self-pay | Admitting: Nurse Practitioner

## 2018-07-31 MED FILL — LORazepam 0.5 MG TABS: 0.5 | 30 days supply | Qty: 60 | Fill #0

## 2018-09-16 ENCOUNTER — Encounter: Payer: Self-pay | Admitting: Cardiology

## 2018-09-16 ENCOUNTER — Ambulatory Visit (INDEPENDENT_AMBULATORY_CARE_PROVIDER_SITE_OTHER): Payer: Medicare Other | Admitting: Cardiology

## 2018-09-16 VITALS — BP 128/85 | HR 90 | Ht 65.0 in | Wt 223.0 lb

## 2018-09-16 DIAGNOSIS — R0600 Dyspnea, unspecified: Secondary | ICD-10-CM

## 2018-09-16 DIAGNOSIS — R0609 Other forms of dyspnea: Secondary | ICD-10-CM | POA: Diagnosis not present

## 2018-09-16 DIAGNOSIS — E785 Hyperlipidemia, unspecified: Secondary | ICD-10-CM | POA: Diagnosis not present

## 2018-09-16 DIAGNOSIS — I428 Other cardiomyopathies: Secondary | ICD-10-CM | POA: Diagnosis not present

## 2018-09-16 DIAGNOSIS — E668 Other obesity: Secondary | ICD-10-CM | POA: Diagnosis not present

## 2018-09-16 NOTE — Assessment & Plan Note (Signed)
Monitored by PCP.  Has not been checked since last year.  LDL at that time was 129.  Probably would benefit from actually considering treatment with statin, would probably start with rosuvastatin to avoid side effects).  Due to have labs checked in a month.

## 2018-09-16 NOTE — Assessment & Plan Note (Addendum)
Thought to be related to doxorubicin.  However EF seems to have improved back to relatively normal EF by most recent echocardiogram.  I do think her exertional dyspnea now is probably related to deconditioning as well as some diastolic dysfunction. She declined stress testing, partly because of financial issues but also because she feels it is related to her deconditioning and obesity. Without PND, orthopnea or edema, would not necessarily need follow-up echo.Marland Kitchen

## 2018-09-16 NOTE — Progress Notes (Signed)
PCP: Gaynelle Arabian, MD  Clinic Note: Chief Complaint  Patient presents with  . Follow-up    Only notes shortness of breath.  No more palpitations or chest pain    HPI: Crystal [name] is a 72 y.o. female with a PMH below who presents today for 6 month follow-up for chemotherapy related NICM. On Chemo for Non-Hodgkins Lymphoma  Crystal [name] was last seen on February 11, 2018 - DOE, palpitations.  She used to be at a walk around the track and do routine activities, but now can barely walk around the track one time.  She had an episode though on Easter Sunday after lunch where she started feeling sharp stinging pain up underneath the rib cage radiating around both sides there was associate with nausea and clamminess dizziness.  It sort of radiated around to the back.  His overall lasted about 30 minutes.  This is when she noted some palpitations.  Recent Hospitalizations:   06/30/2018 - ER - L sided Abdominal pain; CT w/o notable findings; somewhat similar to Easter episode, but no N/V.  Studies Personally Reviewed - (if available, images/films reviewed: From Epic Chart or Care Everywhere)  Did not do Myoview ST  Interval History: Crystal [name] presents today really only noticing that she has exertional dyspnea that she thinks is because of deconditioning.  She really cannot do much exercise at all because of her knees.  And as such as not been overly active.  She is no longer noticing any palpitations.  She also thinks that the episode of discomfort she had in her epigastric/chest area back in the spring was probably more related to gallbladder issues.  She thinks that this may been the same being the cause of discomfort back in September.  Has not had any further episodes. We talked about potentially evaluating with a CPX to exclude ischemia, but she tells that this is not necessary. Mild puffy swelling but no significant edema.  No PND orthopnea.  No chest tightness pressure with rest or  exertion.  No claudication. No longer having the palpitations.  No rapid irregular heartbeats.  No syncope/near syncope or TIA/amaurosis fugax.  No claudication  ROS: A comprehensive was performed.Pertinent symptoms noted in HPI Review of Systems  Constitutional: Positive for malaise/fatigue.  HENT: Negative for nosebleeds.   Respiratory: Positive for shortness of breath (Per HPI). Negative for cough.   Gastrointestinal: Positive for heartburn. Negative for abdominal pain (No more since hospitalization), blood in stool, constipation and melena.  Genitourinary: Negative for hematuria.  Musculoskeletal: Positive for joint pain (Arthritis--both knees). Negative for falls.  Neurological: Positive for dizziness (Some postural dizziness).  Psychiatric/Behavioral: The patient has insomnia.   All other systems reviewed and are negative.   I have reviewed and (if needed) personally updated the patient's problem list, medications, allergies, past medical and surgical history, social and family history.   Past Medical History:  Diagnosis Date  . Anxiety   . Breast cancer ([place]) 1998   (Rt) lumpectomy dx 1999; Dr. Benay Spice  . Dyslipidemia, goal LDL below 130   . Essential hypertension   . Lymphoma ([place])    lymphoma dx 11/28/10 - left neck  . non hodgkins lymphoma 12/2010  . Nonischemic cardiomyopathy ([place]) 2008   ? Doxorubincin induced; essentially resolved as of echo in January 2014, current EF 50-55%. Grade 1 diastolic dysfunction.  . Obesity   . Severe obesity (BMI >= 40) ([place]) 05/21/2013   Improve to BMI of 39 by July 2015  Past Surgical History:  Procedure Laterality Date  . ABDOMINAL HYSTERECTOMY    . BREAST EXCISIONAL BIOPSY Left   . BREAST EXCISIONAL BIOPSY Right   . BREAST LUMPECTOMY Right 1998  . CARDIAC CATHETERIZATION  March 2009    None coronary disease, EF 45% (up from nuclear study EF of 30% in June '08)  . COLONOSCOPY    . DOPPLER ECHOCARDIOGRAPHY  January 2014   EF  50-55%, no regional wall motion and melena. Grade 1 diastolic dysfunction, abnormal relaxation. Normal pressures. Mild aortic sclerosis, no stenosis.  Marland Kitchen NM MYOVIEW LTD  03/29/2007   EF 33%,NEGATIVE ISCHEMIA, prob need cath  . NM MYOVIEW LTD  05/01/2014   Lexiscan: EF 55%. Normal wall motion; no ischemia or infarction; apical thinning  . PORTACATH PLACEMENT  August 25, 2013   rt. with tip in cavoatrial junction, Dr.Yamagata   . TRANSTHORACIC ECHOCARDIOGRAM  05/01/2014   Normal LV size with low normal function. EF of 50-55% and Gr 1 DD; aortic sclerosis without stenosis. MAC and thickening/calcification of the anterior leaflet - no notable AI / AS or MR/MS    Current Meds  Medication Sig  . aspirin EC 81 MG tablet Take 81 mg by mouth every morning.  Marland Kitchen LORazepam (ATIVAN) 0.5 MG tablet TAKE 1 TABLET BY MOUTH 2 TIMES DAILY AS NEEDED  . losartan-hydrochlorothiazide (HYZAAR) 50-12.5 MG per tablet Take 1 tablet by mouth every morning.   . metoprolol tartrate (LOPRESSOR) 50 MG tablet TAKE 1 TABLET TWICE DAILY    Allergies  Allergen Reactions  . Codeine Other (See Comments)    Bad Headaches  . Coreg Other (See Comments)    "Made my legs hurt"  . Lisinopril Cough    Social History   Tobacco Use  . Smoking status: Never Smoker  . Smokeless tobacco: Never Used  Substance Use Topics  . Alcohol use: Yes    Comment: social   . Drug use: No   Social History   Social History Narrative   She is a widow mother of 2, with 1 child is deceased. She has 2 grandchildren. She is trying to get some walking in town and can do about 20-25 minutes his another 30 minutes she pushes a more night gets short of breath.   She is at retired [employer], he simply laid off after 45 years.   She usually comes accompanied by her sister, who is also my patient.   She never was a smoker, and only occasionally has a social alcoholic beverage.     family history includes Cancer in her paternal  grandmother; Heart Problems in her father, maternal grandmother, mother, and paternal grandfather; Heart attack in her sister; Hypertension in her brother and sister; Stroke in her maternal grandfather.  Wt Readings from Last 3 Encounters:  09/16/18 223 lb (101.2 kg)  07/29/18 223 lb 1.6 oz (101.2 kg)  02/12/18 221 lb 12.8 oz (100.6 kg)    PHYSICAL EXAM BP 128/85   Pulse 90   Ht _0  (1.651 m)   Wt 223 lb (101.2 kg)   SpO2 95%   BMI 37.11 kg/m  Physical Exam  Constitutional: She is oriented to person, place, and time. She appears well-developed and well-nourished. No distress.  Moderate-borderline morbidly obese.  Well-groomed  HENT:  Head: Normocephalic and atraumatic.  Neck: Normal range of motion. Neck supple. No hepatojugular reflux and no JVD present. Carotid bruit is not present.  Cardiovascular: Normal rate, regular rhythm, S1 normal, S2 normal and  intact distal pulses.  Occasional extrasystoles are present. PMI is not displaced (Unable to palpate). Exam reveals distant heart sounds. Exam reveals no gallop and no friction rub.  No murmur heard. Pulmonary/Chest: Effort normal and breath sounds normal. No respiratory distress. She has no wheezes. She has no rales.  Abdominal: Soft. Bowel sounds are normal. She exhibits no distension. There is no tenderness. There is no rebound.  No HSM  Musculoskeletal: Normal range of motion. She exhibits no edema (trace).  Neurological: She is alert and oriented to person, place, and time.  Skin:  Varicose veins noted, but not engorged -- more like spider veins   Psychiatric: She has a normal mood and affect. Her behavior is normal. Judgment and thought content normal.  Nursing note and vitals reviewed.   Adult ECG Report Not checked  Other studies Reviewed: Additional studies/ records that were reviewed today include:  Recent Labs:  n/a    ASSESSMENT / PLAN: Problem List Items Addressed This Visit    Dyslipidemia, goal LDL below  130 (Chronic)    Monitored by PCP.  Has not been checked since last year.  LDL at that time was 129.  Probably would benefit from actually considering treatment with statin, would probably start with rosuvastatin to avoid side effects).  Due to have labs checked in a month.      Dyspnea on exertion (Chronic)    Probably related to obesity, deconditioning and may be to assess some extent of diastolic dysfunction. Plan: Continue to recommend trying exercise.  If symptoms were to get worse, can consider stress testing including either CPX or true ischemic evaluation with Myoview stress test      Moderate obesity (Chronic)    Needs to adjust her diet and increase her exercise level.      Nonischemic cardiomyopathy ([facility]) - Primary (Chronic)    Thought to be related to doxorubicin.  However EF seems to have improved back to relatively normal EF by most recent echocardiogram.  I do think her exertional dyspnea now is probably related to deconditioning as well as some diastolic dysfunction. She declined stress testing, partly because of financial issues but also because she feels it is related to her deconditioning and obesity. Without PND, orthopnea or edema, would not necessarily need follow-up echo..         I spent a total of 20 minutes on chart review and direct patient care. >  50% of the time was spent in direct patient consultation discussing her symptoms and plan of action.  Current medicines are reviewed at length with the patient today. (+/- concerns) n/a The following changes have been made: n/a  Patient Instructions  Medication Instructions:   not needed  If you need a refill on your cardiac medications before your next appointment, please call your pharmacy.   Lab work: Not needed If you have labs (blood work) drawn today and your tests are completely normal, you will receive your results only by: Marland Kitchen MyChart Message (if you have MyChart) OR . A paper copy in the mail If  you have any lab test that is abnormal or we need to change your treatment, we will call you to review the results.  Testing/Procedures: not needed  Follow-Up: At [facility], you and your health needs are our priority.  As part of our continuing mission to provide you with exceptional heart care, we have created designated Provider Care Teams.  These Care Teams include your primary Cardiologist (physician) and Advanced Practice  Providers (APPs -  Physician Assistants and Nurse Practitioners) who all work together to provide you with the care you need, when you need it. You will need a follow up appointment in 5 months April 2020.  Please call our office 2 months in advance to schedule this appointment.  You may see Glenetta Hew, MD  or one of the following Advanced Practice Providers on your designated Care Team:   Rosaria Ferries, PA-C . Jory Sims, DNP, ANP  Any Other Special Instructions Will Be Listed Below (If Applicable).    Studies Ordered:   No orders of the defined types were placed in this encounter.     Glenetta Hew, M.D., M.S. Interventional Cardiologist   Pager # [phone] Phone # [phone] [address] [place], [place] [address]

## 2018-09-16 NOTE — Patient Instructions (Signed)
Medication Instructions:   not needed  If you need a refill on your cardiac medications before your next appointment, please call your pharmacy.   Lab work: Not needed If you have labs (blood work) drawn today and your tests are completely normal, you will receive your results only by: Marland Kitchen MyChart Message (if you have MyChart) OR . A paper copy in the mail If you have any lab test that is abnormal or we need to change your treatment, we will call you to review the results.  Testing/Procedures: not needed  Follow-Up: At [facility], you and your health needs are our priority.  As part of our continuing mission to provide you with exceptional heart care, we have created designated Provider Care Teams.  These Care Teams include your primary Cardiologist (physician) and Advanced Practice Providers (APPs -  Physician Assistants and Nurse Practitioners) who all work together to provide you with the care you need, when you need it. You will need a follow up appointment in 5 months April 2020.  Please call our office 2 months in advance to schedule this appointment.  You may see Glenetta Hew, MD  or one of the following Advanced Practice Providers on your designated Care Team:   Rosaria Ferries, PA-C . Jory Sims, DNP, ANP  Any Other Special Instructions Will Be Listed Below (If Applicable).

## 2018-09-16 NOTE — Assessment & Plan Note (Signed)
Needs to adjust her diet and increase her exercise level.

## 2018-09-16 NOTE — Assessment & Plan Note (Signed)
Probably related to obesity, deconditioning and may be to assess some extent of diastolic dysfunction. Plan: Continue to recommend trying exercise.  If symptoms were to get worse, can consider stress testing including either CPX or true ischemic evaluation with Myoview stress test

## 2018-09-24 ENCOUNTER — Other Ambulatory Visit: Payer: Self-pay | Admitting: Family Medicine

## 2018-09-24 DIAGNOSIS — Z1231 Encounter for screening mammogram for malignant neoplasm of breast: Secondary | ICD-10-CM

## 2018-09-25 MED FILL — LORazepam 0.5 MG TABS: 0.5 | 30 days supply | Qty: 60 | Fill #1

## 2018-09-30 DIAGNOSIS — F411 Generalized anxiety disorder: Secondary | ICD-10-CM | POA: Diagnosis not present

## 2018-09-30 DIAGNOSIS — K589 Irritable bowel syndrome without diarrhea: Secondary | ICD-10-CM | POA: Diagnosis not present

## 2018-09-30 DIAGNOSIS — I700 Atherosclerosis of aorta: Secondary | ICD-10-CM | POA: Diagnosis not present

## 2018-09-30 DIAGNOSIS — I129 Hypertensive chronic kidney disease with stage 1 through stage 4 chronic kidney disease, or unspecified chronic kidney disease: Secondary | ICD-10-CM | POA: Diagnosis not present

## 2018-09-30 DIAGNOSIS — E7800 Pure hypercholesterolemia, unspecified: Secondary | ICD-10-CM | POA: Diagnosis not present

## 2018-09-30 DIAGNOSIS — Z8572 Personal history of non-Hodgkin lymphomas: Secondary | ICD-10-CM | POA: Diagnosis not present

## 2018-09-30 DIAGNOSIS — R7301 Impaired fasting glucose: Secondary | ICD-10-CM | POA: Diagnosis not present

## 2018-09-30 DIAGNOSIS — Z1389 Encounter for screening for other disorder: Secondary | ICD-10-CM | POA: Diagnosis not present

## 2018-09-30 DIAGNOSIS — N183 Chronic kidney disease, stage 3 (moderate): Secondary | ICD-10-CM | POA: Diagnosis not present

## 2018-09-30 DIAGNOSIS — K219 Gastro-esophageal reflux disease without esophagitis: Secondary | ICD-10-CM | POA: Diagnosis not present

## 2018-09-30 DIAGNOSIS — G4700 Insomnia, unspecified: Secondary | ICD-10-CM | POA: Diagnosis not present

## 2018-11-25 ENCOUNTER — Ambulatory Visit
Admission: RE | Admit: 2018-11-25 | Discharge: 2018-11-25 | Disposition: A | Discharge status: Home / Self Care | Payer: Medicare Other | Source: Ambulatory Visit | Attending: Family Medicine | Admitting: Family Medicine

## 2018-11-25 DIAGNOSIS — Z1231 Encounter for screening mammogram for malignant neoplasm of breast: Secondary | ICD-10-CM | POA: Diagnosis not present

## 2019-01-16 ENCOUNTER — Other Ambulatory Visit: Payer: Self-pay | Admitting: Oncology

## 2019-01-16 DIAGNOSIS — C8599 Non-Hodgkin lymphoma, unspecified, extranodal and solid organ sites: Secondary | ICD-10-CM

## 2019-01-17 MED FILL — LORazepam 0.5 MG TABS: 0.5 | 30 days supply | Qty: 60 | Fill #0

## 2019-02-21 ENCOUNTER — Telehealth: Payer: Self-pay | Admitting: Cardiology

## 2019-02-21 NOTE — Telephone Encounter (Signed)
Pt states she comes in once a year.  Refused to schedule appt.

## 2019-05-06 MED FILL — LORazepam 0.5 MG TABS: 0.5 | 30 days supply | Qty: 60 | Fill #1

## 2019-07-29 ENCOUNTER — Other Ambulatory Visit: Payer: Medicare Other

## 2019-07-29 ENCOUNTER — Ambulatory Visit: Payer: Medicare Other | Admitting: Oncology

## 2019-08-13 DIAGNOSIS — Z23 Encounter for immunization: Secondary | ICD-10-CM | POA: Diagnosis not present

## 2019-08-28 ENCOUNTER — Telehealth: Payer: Self-pay | Admitting: Oncology

## 2019-08-28 NOTE — Telephone Encounter (Signed)
Returned patient's phone call regarding rescheduling an appointment, per patient's request cancelled 10/06 appointment has been rescheduled to 11/12.

## 2019-09-01 ENCOUNTER — Ambulatory Visit: Payer: Medicare Other | Admitting: Cardiology

## 2019-09-04 ENCOUNTER — Inpatient Hospital Stay: Payer: Medicare Other

## 2019-09-04 ENCOUNTER — Telehealth: Payer: Self-pay | Admitting: Oncology

## 2019-09-04 ENCOUNTER — Other Ambulatory Visit: Payer: Self-pay

## 2019-09-04 ENCOUNTER — Inpatient Hospital Stay: Payer: Medicare Other | Attending: Oncology | Admitting: Oncology

## 2019-09-04 VITALS — BP 105/74 | HR 83 | Temp 98.2°F | Resp 17 | Ht 65.0 in | Wt 224.3 lb

## 2019-09-04 DIAGNOSIS — I10 Essential (primary) hypertension: Secondary | ICD-10-CM | POA: Diagnosis not present

## 2019-09-04 DIAGNOSIS — Z853 Personal history of malignant neoplasm of breast: Secondary | ICD-10-CM | POA: Diagnosis not present

## 2019-09-04 DIAGNOSIS — N951 Menopausal and female climacteric states: Secondary | ICD-10-CM | POA: Diagnosis not present

## 2019-09-04 DIAGNOSIS — L905 Scar conditions and fibrosis of skin: Secondary | ICD-10-CM | POA: Insufficient documentation

## 2019-09-04 DIAGNOSIS — C8298 Follicular lymphoma, unspecified, lymph nodes of multiple sites: Secondary | ICD-10-CM | POA: Diagnosis not present

## 2019-09-04 DIAGNOSIS — C8599 Non-Hodgkin lymphoma, unspecified, extranodal and solid organ sites: Secondary | ICD-10-CM

## 2019-09-04 MED ORDER — LORAZEPAM 0.5 MG PO TABS: 0.5000 mg | ORAL_TABLET | Freq: Two times a day (BID) | ORAL | 1 refills | Status: DC

## 2019-09-04 MED FILL — LORazepam 0.5 MG TABS: 0.5 | 30 days supply | Qty: 60 | Fill #0

## 2019-09-04 NOTE — Progress Notes (Signed)
[facility] OFFICE PROGRESS NOTE   Diagnosis: Breast cancer, non-Hodgkin's lymphoma  INTERVAL HISTORY:   Crystal [name] returns as scheduled.  She feels well.  Good appetite.  She continues to have hot flashes.  She has intermittent discomfort at the firm area superior to the right lumpectomy scar.  Objective:  Vital signs in last 24 hours:  Blood pressure 105/74, pulse 83, temperature 98.2 F (36.8 C), temperature source Temporal, resp. rate 17, height 5\' 5"  (1.651 m), weight 224 lb 4.8 oz (101.7 kg), SpO2 97 %.    HEENT: Neck without mass Lymphatics: No cervical, supraclavicular, axillary, or inguinal nodes GI: No hepatosplenomegaly, nontender, no mass Vascular: No leg edema Breast: No mass in either breast, there is a firm mobile subcutaneous area superior to the medial aspect of the right lumpectomy scar    Lab Results:  Lab Results  Component Value Date   WBC 4.3 06/30/2018   HGB 14.1 06/30/2018   HCT 43.1 06/30/2018   MCV 89.6 06/30/2018   PLT 233 06/30/2018   NEUTROABS 1.9 06/30/2014    CMP  Lab Results  Component Value Date   NA 140 06/30/2018   K 3.5 06/30/2018   CL 105 06/30/2018   CO2 26 06/30/2018   GLUCOSE 113 (H) 06/30/2018   BUN 17 06/30/2018   CREATININE 1.14 (H) 06/30/2018   CALCIUM 9.1 06/30/2018   PROT 7.6 06/30/2018   ALBUMIN 3.6 06/30/2018   AST 24 06/30/2018   ALT 17 06/30/2018   ALKPHOS 79 06/30/2018   BILITOT 0.6 06/30/2018   GFRNONAA 47 (L) 06/30/2018   GFRAA 54 (L) 06/30/2018    Medications: I have reviewed the patient's current medications.   Assessment/Plan: 1. Low-grade follicular lymphoma involving a right parotid mass, status post an excisional biopsy on 11/28/2010. 1. Staging CT scans 01/03/2011 confirmed an increased number of small nodes in the neck, left axilla and pelvis without clear evidence of pathologic lymphadenopathy. 2. PET scan 01/11/2011 confirmed hypermetabolic lymph nodes in the right cervical  chain, left axillary nodes, periaortic, common iliac, external iliac and inguinal nodes. There was also a possible area of involvement at the right tonsillar region. 3. Palpable left posterior cervical nodes confirmed on exam 05/15/2013- progressive left neck nodes on exam 08/18/2013. 4. Status post cycle 1 bendamustine/Rituxan beginning 08/28/2013. 5. Near-complete resolution of left neck adenopathy on exam 09/12/2013. 6. Status post cycle 2 bendamustine/Rituxan beginning 09/25/2013. 7. CT abdomen/pelvis 10/01/2013-near-complete response to therapy with isolated borderline enlarged left iliac node measuring 1.37 m. Previously identified right peritoneal right pelvic sidewall adenopathy is resolved. 8. Cycle 3 bendamustine/Rituxan beginning 10/28/2013. 9. Cycle 4 bendamustine/Rituxan beginning 11/25/2013. 10. Cycle 5 of bendamustine/Rituxan beginning 12/24/2013. 11. Cycle 6 bendamustine/rituximab 01/27/2014. 2. Stage I right-sided breast cancer diagnosed in 1998. 3. History of congestive heart failure. 4. Hypertension. 5. Port-A-Cath placement 08/25/2013 in interventional radiology. Removed 04/22/2014. 6. Chills during the Rituxan infusion 08/28/2013. She was given Solu-Medrol. Rituxan was resumed and completed. 7. Abdominal pain following cycle 2 bendamustine/Rituxan-no explanation for the pain on a CT 10/01/2013, resolved after starting Protonix. 8. Tachycardia 12/23/2013. Chest CT showed a pulmonary embolus. She completed 3 months of anticoagulation. 9. Chest CT 12/23/2013. Small nonocclusive right lower lobe pulmonary embolus. Minimal thrombus burden. No other emboli demonstrated. Xarelto initiated. Right upper extremity and bilateral lower extremity Dopplers negative on 12/25/2013.  Disposition: Crystal [name] is in clinical remission from non-Hodgkin's lymphoma and breast cancer.  She continues yearly mammography.  She will return for office visit in  1 year.  She will contact us in the interim  for new symptoms.  The area superior to the right lumpectomy scar is unlikely a suture granuloma or area of fat necrosis.  She would like to consider having this area removed.  I made a referral to Dr. Donne Hazel.  [name], MD  09/04/2019  9:43 AM

## 2019-09-04 NOTE — Telephone Encounter (Signed)
Scheduled per los. Gave calendar

## 2019-09-11 ENCOUNTER — Ambulatory Visit: Payer: Medicare Other | Admitting: Cardiology

## 2019-09-29 ENCOUNTER — Telehealth: Payer: Self-pay | Admitting: *Deleted

## 2019-09-29 ENCOUNTER — Ambulatory Visit: Payer: Medicare Other | Admitting: Cardiology

## 2019-09-29 NOTE — Telephone Encounter (Signed)
Left message for patient -   Switching  In office to virtual 09/29/19   patient may cal back to reschedule if needed.

## 2019-09-30 ENCOUNTER — Encounter: Payer: Self-pay | Admitting: Cardiology

## 2019-09-30 ENCOUNTER — Telehealth (INDEPENDENT_AMBULATORY_CARE_PROVIDER_SITE_OTHER): Payer: Medicare Other | Admitting: Cardiology

## 2019-09-30 ENCOUNTER — Telehealth: Payer: Self-pay | Admitting: *Deleted

## 2019-09-30 VITALS — Ht 65.0 in | Wt 224.0 lb

## 2019-09-30 DIAGNOSIS — I10 Essential (primary) hypertension: Secondary | ICD-10-CM

## 2019-09-30 DIAGNOSIS — R0609 Other forms of dyspnea: Secondary | ICD-10-CM

## 2019-09-30 DIAGNOSIS — I428 Other cardiomyopathies: Secondary | ICD-10-CM

## 2019-09-30 DIAGNOSIS — Z6837 Body mass index (BMI) 37.0-37.9, adult: Secondary | ICD-10-CM

## 2019-09-30 DIAGNOSIS — R0600 Dyspnea, unspecified: Secondary | ICD-10-CM | POA: Diagnosis not present

## 2019-09-30 DIAGNOSIS — E785 Hyperlipidemia, unspecified: Secondary | ICD-10-CM | POA: Diagnosis not present

## 2019-09-30 DIAGNOSIS — E668 Other obesity: Secondary | ICD-10-CM

## 2019-09-30 NOTE — Assessment & Plan Note (Signed)
Likely related to chemotherapy.  EF seems to be back to baseline now.  She has some deconditioning related exertional dyspnea, but no real CHF symptoms.  He is on ARB and beta-blocker stable doses.  Seems euvolemic with no PND, orthopnea or edema.  May consider follow-up echocardiogram if she were to have some the symptoms, but also to evaluate her aortic sclerosis murmur.

## 2019-09-30 NOTE — Assessment & Plan Note (Signed)
The patient understands the need to lose weight with diet and exercise. We have discussed specific strategies for this.  

## 2019-09-30 NOTE — Assessment & Plan Note (Signed)
Labs are being monitored by PCP.  Due to be followed up soon.  Have been relatively well controlled.  She is not currently on treatment.  Low threshold to consider low-dose therapy if her LDL does go up.  Would recommend low-dose rosuvastatin that may be 5 to 10 mg daily.

## 2019-09-30 NOTE — Assessment & Plan Note (Signed)
Well-controlled on current meds.  No change 

## 2019-09-30 NOTE — Telephone Encounter (Signed)
SPOKE TO PATIENT. INSTRUCTION WAS GIVEN FROM TODAY'S VIRTUAL VISIT 09/30/19.  AVS SUMMARY IS MAILED.   PATIENT VERBALIZED UNDERSTANDING.

## 2019-09-30 NOTE — Patient Instructions (Addendum)
  Medication Instructions:  None *If you need a refill on your cardiac medications before your next appointment, please call your pharmacy*  Lab Work:  Anticipate that your PCP will be having her labs drawn soon    Testing/Procedures: None for now  Follow-Up: At [facility], you and your health needs are our priority.  As part of our continuing mission to provide you with exceptional heart care, we have created designated Provider Care Teams.  These Care Teams include your primary Cardiologist (physician) and Advanced Practice Providers (APPs -  Physician Assistants and Nurse Practitioners) who all work together to provide you with the care you need, when you need it.  Your next appointment:   7 month(s)  The format for your next appointment:   In Person  Provider:   Glenetta Hew, MD   Other Instructions None

## 2019-09-30 NOTE — Progress Notes (Signed)
Virtual Visit via Telephone Note   This visit type was conducted due to national recommendations for restrictions regarding the COVID-19 Pandemic (e.g. social distancing) in an effort to limit this patient's exposure and mitigate transmission in our community.  Due to her co-morbid illnesses, this patient is at least at moderate risk for complications without adequate follow up.  This format is felt to be most appropriate for this patient at this time.  The patient did not have access to video technology/had technical difficulties with video requiring transitioning to audio format only (telephone).  All issues noted in this document were discussed and addressed.  No physical exam could be performed with this format.  Please refer to the patient's chart for her  consent to telehealth for [facility]   Patient has given verbal permission to conduct this visit via virtual appointment and to bill insurance 09/30/2019 11:41 AM     Evaluation Performed:  Follow-up visit  Date:  09/30/2019   ID:  JUNIPER COBEY, DOB 08-16-1946, MRN [id number]  Patient Location: Home Provider Location: Other:  Hospital Office  PCP:  Gaynelle Arabian, MD  Cardiologist:  Glenetta Hew, MD Electrophysiologist:  None   Chief Complaint:  Chief Complaint  Patient presents with   Follow-up   Cardiomyopathy    Nonischemic    History of Present Illness:    Crystal [name] is a 73 y.o. female with PMH notable for nonischemic cardiomyopathy following chemotherapy for non-Hodgkin's lymphoma, who presents via audio/video conferencing for a telehealth visit today.  KASSIDEE NARCISO was last seen on September 16, 2018.  She noted some exertional dyspnea related to deconditioning.  Limited activity because of knee pain.  No significant palpitations.  Epigastric right side chest discomfort thought to be related to gallbladder disease.  She was not interested in evaluating CPX.  Mild puffy edema but no PND  orthopnea.  Hospitalizations:   none  Prior CV studies:   The following studies were reviewed today:  none:  Inerval History   CHELBI HERBER is doing well.  Notes that she really has no real change in DOE - a little, but not worried. Since retiring, edema has essentially resolved - b/c less sitting.  Only gets DOE walking around [place], shopping etc - she does this more for exercise b/c not able to go to [facility] now.   Cardiovascular ROS: positive for - dyspnea on exertion and no change negative for - chest pain, edema, irregular heartbeat, orthopnea, palpitations, paroxysmal nocturnal dyspnea, rapid heart rate, shortness of breath or syncope, near syncope; TIA/amaurosis fugax, claudication  ROS:  Please see the history of present illness.    The patient does not have symptoms concerning for COVID-19 infection (fever, chills, cough, or new shortness of breath).   Review of Systems  Constitutional: Negative for malaise/fatigue.  HENT: Negative for congestion and nosebleeds.   Respiratory: Positive for cough (chronic - irritation ) and shortness of breath (with exertion only). Negative for wheezing.   Cardiovascular: Negative for palpitations and claudication.  Gastrointestinal: Negative for blood in stool and melena.  Genitourinary: Negative for hematuria.  Musculoskeletal: Negative for falls.  Neurological: Negative for dizziness, loss of consciousness and weakness.  Endo/Heme/Allergies: Negative for environmental allergies.  Psychiatric/Behavioral: The patient has insomnia (hard to go back to sleep if awakened.).   All other systems reviewed and are negative.   The patient is practicing social distancing & wearing mask.   Past Medical History:  Diagnosis Date   Anxiety  Breast cancer ([place]) 1998   (Rt) lumpectomy dx 1999; Dr. Benay Spice   Dyslipidemia, goal LDL below 130    Essential hypertension    Lymphoma ([place])    lymphoma dx 11/28/10 - left neck   non hodgkins  lymphoma 12/2010   Nonischemic cardiomyopathy ([place]) 2008   ? Doxorubincin induced; essentially resolved as of echo in January 2014, current EF 50-55%. Grade 1 diastolic dysfunction.   Obesity    Severe obesity (BMI >= 40) ([place]) 05/21/2013   Improve to BMI of 39 by July 2015    Past Surgical History:  Procedure Laterality Date   ABDOMINAL HYSTERECTOMY     BREAST EXCISIONAL BIOPSY Left    BREAST EXCISIONAL BIOPSY Right    BREAST LUMPECTOMY Right 1998   COLONOSCOPY     LEFT HEART CATH AND CORONARY ANGIOGRAPHY  March 2009    None coronary disease, EF 45% (up from nuclear study EF of 30% in June '08)   NM MYOVIEW LTD  03/29/2007   EF 33%,NEGATIVE ISCHEMIA, prob need cath   NM MYOVIEW LTD  05/01/2014   Lexiscan: EF 55%. Normal wall motion; no ischemia or infarction; apical thinning   PORTACATH PLACEMENT  August 25, 2013   rt. with tip in cavoatrial junction, Dr.Yamagata    TRANSTHORACIC ECHOCARDIOGRAM  05/01/2014   Normal LV size with low normal function. EF of 50-55% and Gr 1 DD; aortic sclerosis without stenosis. MAC and thickening/calcification of the anterior leaflet - no notable AI / AS or MR/MS     Current Meds  Medication Sig   aspirin EC 81 MG tablet Take 81 mg by mouth every morning.   LORazepam (ATIVAN) 0.5 MG tablet Take 1 tablet (0.5 mg total) by mouth 2 (two) times daily. If needed   losartan-hydrochlorothiazide (HYZAAR) 50-12.5 MG per tablet Take 1 tablet by mouth every morning.    metoprolol tartrate (LOPRESSOR) 50 MG tablet TAKE 1 TABLET TWICE DAILY     Allergies:   Codeine, Coreg, and Lisinopril   Social History   Tobacco Use   Smoking status: Never Smoker   Smokeless tobacco: Never Used  Substance Use Topics   Alcohol use: Yes    Comment: social    Drug use: No   -- New Great Grandmother -- now ~  101 yr old in Jan.  Family Hx: The patient's family history includes Cancer in her paternal grandmother; Heart Problems in her father,  maternal grandmother, mother, and paternal grandfather; Heart attack in her sister; Hypertension in her brother and sister; Stroke in her maternal grandfather.   Labs/Other Tests and Data Reviewed:    EKG:  No ECG reviewed.  Recent Labs: No results found for requested labs within last 8760 hours.   Recent Lipid Panel  September 30, 2018: TC 182, TG 133, HDL 40, LDL 116.  A1c 5.9.  Cr 1.09, K+ 3.6. No results found for: CHOL, TRIG, HDL, CHOLHDL, LDLCALC, LDLDIRECT  Wt Readings from Last 3 Encounters:  09/30/19 224 lb (101.6 kg)  09/04/19 224 lb 4.8 oz (101.7 kg)  09/16/18 223 lb (101.2 kg)     Objective:     BP 105/74 (BP Location: Left Arm, Patient Position: Sitting); Pulse 83; Temp 98.2 F (36.8 C) (Temporal) Resp 17 Vital Signs:  Ht _0  (1.651 m)    Wt 224 lb (101.6 kg)    BMI 37.28 kg/m   Pleasant female in no acute distress. A&O x 3.  Normal Mood & Affect. Non-labored respirations  ASSESSMENT &  PLAN:    Problem List Items Addressed This Visit    Moderate obesity (Chronic)    The patient understands the need to lose weight with diet and exercise. We have discussed specific strategies for this.      Nonischemic cardiomyopathy ([place]) - Primary (Chronic)    Likely related to chemotherapy.  EF seems to be back to baseline now.  She has some deconditioning related exertional dyspnea, but no real CHF symptoms.  He is on ARB and beta-blocker stable doses.  Seems euvolemic with no PND, orthopnea or edema.  May consider follow-up echocardiogram if she were to have some the symptoms, but also to evaluate her aortic sclerosis murmur.      Essential hypertension (Chronic)    Well-controlled on current meds.  No change      Dyslipidemia, goal LDL below 130 (Chronic)    Labs are being monitored by PCP.  Due to be followed up soon.  Have been relatively well controlled.  She is not currently on treatment.  Low threshold to consider low-dose therapy if her LDL does go up.  Would  recommend low-dose rosuvastatin that may be 5 to 10 mg daily.      Dyspnea on exertion (Chronic)    Probably related to deconditioning and obesity.  No real change.        Nonischemic cardiomyopathy, morbid obesity, dyspnea exertion, dyslipidemia.  COVID-19 Education: The signs and symptoms of COVID-19 were discussed with the patient and how to seek care for testing (follow up with PCP or arrange E-visit).   The importance of social distancing was discussed today.  Time:   Today, I have spent 18 minutes with the patient with telehealth technology discussing the above problems.     Medication Adjustments/Labs and Tests Ordered: Current medicines are reviewed at length with the patient today.  Concerns regarding medicines are outlined above.   Patient Instructions   Medication Instructions:  None *If you need a refill on your cardiac medications before your next appointment, please call your pharmacy*  Lab Work:  Anticipate that your PCP will be having her labs drawn soon    Testing/Procedures: None for now  Follow-Up: At [facility], you and your health needs are our priority.  As part of our continuing mission to provide you with exceptional heart care, we have created designated Provider Care Teams.  These Care Teams include your primary Cardiologist (physician) and Advanced Practice Providers (APPs -  Physician Assistants and Nurse Practitioners) who all work together to provide you with the care you need, when you need it.  Your next appointment:   7 month(s)  The format for your next appointment:   In Person  Provider:   Glenetta Hew, MD   Other Instructions None      Signed, Glenetta Hew, MD  09/30/2019 11:41 AM    [place]

## 2019-09-30 NOTE — Assessment & Plan Note (Signed)
Probably related to deconditioning and obesity.  No real change.

## 2019-10-16 ENCOUNTER — Telehealth: Payer: Self-pay | Admitting: *Deleted

## 2019-10-16 NOTE — Telephone Encounter (Signed)
Called to f/u on status of referral to [facility] regarding firm area at lumpectomy scar. She reports the did reach out, but she decided not to purse this. Concerned she may get lymphedema.

## 2019-10-27 ENCOUNTER — Other Ambulatory Visit: Payer: Self-pay | Admitting: Family Medicine

## 2019-10-27 DIAGNOSIS — Z1231 Encounter for screening mammogram for malignant neoplasm of breast: Secondary | ICD-10-CM

## 2019-12-05 ENCOUNTER — Ambulatory Visit: Payer: Medicare Other

## 2020-01-12 ENCOUNTER — Ambulatory Visit: Payer: Medicare Other

## 2020-01-19 ENCOUNTER — Telehealth: Payer: Self-pay | Admitting: Cardiology

## 2020-01-19 DIAGNOSIS — R0600 Dyspnea, unspecified: Secondary | ICD-10-CM

## 2020-01-19 DIAGNOSIS — R0609 Other forms of dyspnea: Secondary | ICD-10-CM

## 2020-01-19 NOTE — Telephone Encounter (Signed)
New message  Patient is calling in to inquire about some test that Dr. Ellyn Hack wanted her to have. No orders in patient's chart. Patient is wanting to speak with Ivin Booty about this. Please give patient a call back.

## 2020-01-19 NOTE — Telephone Encounter (Signed)
Patient states that she was told at her telemed visit in December there was some test that needed to be ran- I advised of note that said they may need to repeat ECHO is symptoms do not get any better.  Patient states that she has been a little more SOB, no chest pains- but would like to get the ECHO completed. I advised I would let Dr.Harding know, and if he agreed I would order and have schedulers call to schedule.  Patient verbalized understanding, thankful for call.

## 2020-01-19 NOTE — Telephone Encounter (Signed)
I agree - lets go ahead with Echo.  Glenetta Hew, MD

## 2020-01-20 NOTE — Telephone Encounter (Signed)
Called no answer ,left message on answer machine. Dr Ellyn Hack  Will schedule  her to have echo done now. The scheduler will contact patient.  order placed. Any questing may call back

## 2020-01-27 MED FILL — LORazepam 0.5 MG TABS: 0.5 | 30 days supply | Qty: 60 | Fill #1

## 2020-02-03 ENCOUNTER — Ambulatory Visit (HOSPITAL_COMMUNITY): Payer: Medicare Other | Attending: Internal Medicine

## 2020-02-03 ENCOUNTER — Other Ambulatory Visit: Payer: Self-pay

## 2020-02-03 DIAGNOSIS — R0600 Dyspnea, unspecified: Secondary | ICD-10-CM | POA: Diagnosis not present

## 2020-02-03 DIAGNOSIS — R0609 Other forms of dyspnea: Secondary | ICD-10-CM

## 2020-02-04 ENCOUNTER — Telehealth: Payer: Self-pay | Admitting: Cardiology

## 2020-02-04 NOTE — Telephone Encounter (Signed)
Contacted patient- advised I did not have results yet, will send to MD to advise and will call back once I have those.

## 2020-02-04 NOTE — Telephone Encounter (Signed)
Patient was calling for the results of the echo she had dome yesterday. Please call her to discuss results once they are ready.

## 2020-02-04 NOTE — Telephone Encounter (Signed)
I mean really this just came into my inbox this morning.  Echo results shows about the same ejection fraction read as 45 to 50% previously read is 50 to 55%.  So probably averages about 50%.  No obvious wall motion abnormalities.   Unfortunate, unable assess pulmonary arterial pressures, but right atrial pressures look normal.   Overall looks relatively stable.   Glenetta Hew, MD

## 2020-02-04 NOTE — Telephone Encounter (Signed)
Called patient, gave results.  Patient thankful for call back.

## 2020-02-09 ENCOUNTER — Other Ambulatory Visit (HOSPITAL_COMMUNITY): Payer: Medicare Other

## 2020-02-09 ENCOUNTER — Ambulatory Visit
Admission: RE | Admit: 2020-02-09 | Discharge: 2020-02-09 | Disposition: A | Discharge status: Home / Self Care | Payer: Medicare Other | Source: Ambulatory Visit | Attending: Family Medicine | Admitting: Family Medicine

## 2020-02-09 ENCOUNTER — Other Ambulatory Visit: Payer: Self-pay

## 2020-02-09 DIAGNOSIS — Z1231 Encounter for screening mammogram for malignant neoplasm of breast: Secondary | ICD-10-CM | POA: Diagnosis not present

## 2020-02-11 ENCOUNTER — Ambulatory Visit: Payer: Medicare Other

## 2020-02-17 ENCOUNTER — Other Ambulatory Visit (HOSPITAL_COMMUNITY): Payer: Self-pay | Admitting: Cardiology

## 2020-02-17 ENCOUNTER — Other Ambulatory Visit: Payer: Self-pay

## 2020-02-17 ENCOUNTER — Ambulatory Visit (HOSPITAL_COMMUNITY): Payer: Medicare Other | Attending: Internal Medicine

## 2020-02-17 DIAGNOSIS — R0600 Dyspnea, unspecified: Secondary | ICD-10-CM

## 2020-02-17 DIAGNOSIS — R0609 Other forms of dyspnea: Secondary | ICD-10-CM

## 2020-02-18 ENCOUNTER — Telehealth: Payer: Self-pay | Admitting: Cardiology

## 2020-02-18 NOTE — Telephone Encounter (Signed)
Noted that patient is calling to give cell phone number to be called at for latest echo results. Number is [phone].

## 2020-02-18 NOTE — Telephone Encounter (Signed)
Aware will call mobile #  once result are available

## 2020-02-18 NOTE — Telephone Encounter (Signed)
° °  Pt is looking for Ivin Booty, she wanted to gave her best phone number to call incase echo result is available. She said to call her mobile phone first  Please advise

## 2020-03-02 DIAGNOSIS — H25813 Combined forms of age-related cataract, bilateral: Secondary | ICD-10-CM | POA: Diagnosis not present

## 2020-04-12 ENCOUNTER — Encounter: Payer: Self-pay | Admitting: Oncology

## 2020-04-20 ENCOUNTER — Other Ambulatory Visit: Payer: Self-pay | Admitting: Oncology

## 2020-04-20 DIAGNOSIS — C8599 Non-Hodgkin lymphoma, unspecified, extranodal and solid organ sites: Secondary | ICD-10-CM

## 2020-04-21 ENCOUNTER — Other Ambulatory Visit: Payer: Self-pay | Admitting: Oncology

## 2020-04-21 DIAGNOSIS — C8599 Non-Hodgkin lymphoma, unspecified, extranodal and solid organ sites: Secondary | ICD-10-CM

## 2020-04-21 MED FILL — LORazepam 0.5 MG TABS: 0.5 | 30 days supply | Qty: 60 | Fill #0

## 2020-04-30 ENCOUNTER — Telehealth: Payer: Self-pay | Admitting: Cardiology

## 2020-04-30 ENCOUNTER — Other Ambulatory Visit: Payer: Self-pay | Admitting: Surgery

## 2020-04-30 NOTE — Telephone Encounter (Signed)
Routed to MD to make aware

## 2020-04-30 NOTE — Telephone Encounter (Signed)
Patient is calling to make Dr. Ellyn Hack aware that she is going to have her gallbladder taken out, date to be decided.

## 2020-05-02 NOTE — Telephone Encounter (Signed)
Sounds good.  I think she should be fine.  Glenetta Hew, MD

## 2020-05-25 ENCOUNTER — Telehealth: Payer: Self-pay | Admitting: Cardiology

## 2020-05-25 NOTE — Telephone Encounter (Signed)
Spoke with patient, she is having gallbladder surgery in 2 weeks, she goes back for her follow up in September. She will call to schedule 7-8 mos follow up with Dr. Ellyn Hack afterwards.

## 2020-05-31 NOTE — Patient Instructions (Addendum)
DUE TO COVID-19 ONLY ONE VISITOR IS ALLOWED TO COME WITH YOU AND STAY IN THE WAITING ROOM ONLY DURING PRE OP AND PROCEDURE DAY OF SURGERY. THE 1 VISITOR  MAY VISIT WITH YOU AFTER SURGERY IN YOUR PRIVATE ROOM DURING VISITING HOURS ONLY!  YOU NEED TO HAVE A COVID 19 TEST ON: 06/05/20 @ 10:55 am, THIS TEST MUST BE DONE BEFORE SURGERY,  COVID TESTING SITE [address] [place] [address], IT IS ON THE RIGHT GOING OUT [address] APPROXIMATELY  2 MINUTES PAST [employer] ON THE RIGHT. ONCE YOUR COVID TEST IS COMPLETED,  PLEASE BEGIN THE QUARANTINE INSTRUCTIONS AS OUTLINED IN YOUR HANDOUT.                Crystal [name]    Your procedure is scheduled on: 06/09/20   Report to [facility] Main  Entrance   Report to admitting at: 11:30 AM     Call this number if you have problems the morning of surgery [phone]    Remember:   NO SOLID FOOD AFTER MIDNIGHT THE NIGHT PRIOR TO SURGERY. NOTHING BY MOUTH EXCEPT CLEAR LIQUIDS UNTIL: 10:30 am . PLEASE FINISH ENSURE DRINK PER SURGEON ORDER  WHICH NEEDS TO BE COMPLETED AT: 10:30 am .   CLEAR LIQUID DIET   Foods Allowed                                                                     Foods Excluded  Coffee and tea, regular and decaf                             liquids that you cannot  Plain Jell-O any favor except red or purple                                           see through such as: Fruit ices (not with fruit pulp)                                     milk, soups, orange juice  Iced Popsicles                                    All solid food Carbonated beverages, regular and diet                                    Cranberry, grape and apple juices Sports drinks like Gatorade Lightly seasoned clear broth or consume(fat free) Sugar, honey syrup  Sample Menu Breakfast                                Lunch  Supper Cranberry juice                    Beef broth                             Chicken broth Jell-O                                     Grape juice                           Apple juice Coffee or tea                        Jell-O                                      Popsicle                                                Coffee or tea                        Coffee or tea  _____________________________________________________________________    BRUSH YOUR TEETH MORNING OF SURGERY AND RINSE YOUR MOUTH OUT, NO CHEWING GUM CANDY OR MINTS.     Take these medicines the morning of surgery with A SIP OF WATER: metoprolol.Lorazepam as needed.                                You may not have any metal on your body including hair pins and              piercings  Do not wear jewelry, make-up, lotions, powders or perfumes, deodorant             Do not wear nail polish on your fingernails.  Do not shave  48 hours prior to surgery.    Do not bring valuables to the hospital. [place].  Contacts, dentures or bridgework may not be worn into surgery.  Leave suitcase in the car. After surgery it may be brought to your room.     Patients discharged the day of surgery will not be allowed to drive home. IF YOU ARE HAVING SURGERY AND GOING HOME THE SAME DAY, YOU MUST HAVE AN ADULT TO DRIVE YOU HOME AND BE WITH YOU FOR 24 HOURS. YOU MAY GO HOME BY TAXI OR UBER OR ORTHERWISE, BUT AN ADULT MUST ACCOMPANY YOU HOME AND STAY WITH YOU FOR 24 HOURS.  Name and phone number of your driver:  Special Instructions: N/A              Please read over the following fact sheets you were given: _____________________________________________________________________      [facility] - Preparing for Surgery Before surgery, you can play an important role.  Because skin is not sterile, your skin needs to be as free of germs as possible.  You can reduce the number of germs on your skin by washing with CHG (chlorahexidine gluconate) soap before surgery.   CHG is an antiseptic cleaner which kills germs and bonds with the skin to continue killing germs even after washing. Please DO NOT use if you have an allergy to CHG or antibacterial soaps.  If your skin becomes reddened/irritated stop using the CHG and inform your nurse when you arrive at Short Stay. Do not shave (including legs and underarms) for at least 48 hours prior to the first CHG shower.  You may shave your face/neck. Please follow these instructions carefully:  1.  Shower with CHG Soap the night before surgery and the  morning of Surgery.  2.  If you choose to wash your hair, wash your hair first as usual with your  normal  shampoo.  3.  After you shampoo, rinse your hair and body thoroughly to remove the  shampoo.                           4.  Use CHG as you would any other liquid soap.  You can apply chg directly  to the skin and wash                       Gently with a scrungie or clean washcloth.  5.  Apply the CHG Soap to your body ONLY FROM THE NECK DOWN.   Do not use on face/ open                           Wound or open sores. Avoid contact with eyes, ears mouth and genitals (private parts).                       Wash face,  Genitals (private parts) with your normal soap.             6.  Wash thoroughly, paying special attention to the area where your surgery  will be performed.  7.  Thoroughly rinse your body with warm water from the neck down.  8.  DO NOT shower/wash with your normal soap after using and rinsing off  the CHG Soap.                9.  Pat yourself dry with a clean towel.            10.  Wear clean pajamas.            11.  Place clean sheets on your bed the night of your first shower and do not  sleep with pets. Day of Surgery : Do not apply any lotions/deodorants the morning of surgery.  Please wear clean clothes to the hospital/surgery center.  FAILURE TO FOLLOW THESE INSTRUCTIONS MAY RESULT IN THE CANCELLATION OF YOUR SURGERY PATIENT  SIGNATURE_________________________________  NURSE SIGNATURE__________________________________  ________________________________________________________________________

## 2020-06-01 ENCOUNTER — Encounter (HOSPITAL_COMMUNITY): Payer: Self-pay

## 2020-06-01 ENCOUNTER — Other Ambulatory Visit: Payer: Self-pay

## 2020-06-01 ENCOUNTER — Encounter (HOSPITAL_COMMUNITY)
Admission: RE | Admit: 2020-06-01 | Discharge: 2020-06-01 | Disposition: A | Discharge status: Home / Self Care | Payer: Medicare Other | Source: Ambulatory Visit | Attending: Surgery | Admitting: Surgery

## 2020-06-01 ENCOUNTER — Telehealth: Payer: Self-pay | Admitting: Cardiology

## 2020-06-01 DIAGNOSIS — I10 Essential (primary) hypertension: Secondary | ICD-10-CM | POA: Insufficient documentation

## 2020-06-01 DIAGNOSIS — Z01818 Encounter for other preprocedural examination: Secondary | ICD-10-CM | POA: Diagnosis not present

## 2020-06-01 DIAGNOSIS — Z7982 Long term (current) use of aspirin: Secondary | ICD-10-CM | POA: Insufficient documentation

## 2020-06-01 DIAGNOSIS — Z7901 Long term (current) use of anticoagulants: Secondary | ICD-10-CM | POA: Diagnosis not present

## 2020-06-01 DIAGNOSIS — Z79899 Other long term (current) drug therapy: Secondary | ICD-10-CM | POA: Diagnosis not present

## 2020-06-01 DIAGNOSIS — I255 Ischemic cardiomyopathy: Secondary | ICD-10-CM | POA: Diagnosis not present

## 2020-06-01 DIAGNOSIS — F419 Anxiety disorder, unspecified: Secondary | ICD-10-CM | POA: Diagnosis not present

## 2020-06-01 DIAGNOSIS — K8080 Other cholelithiasis without obstruction: Secondary | ICD-10-CM | POA: Diagnosis not present

## 2020-06-01 LAB — BASIC METABOLIC PANEL
Anion gap: 7 (ref 5–15)
BUN: 15 mg/dL (ref 8–23)
CO2: 27 mmol/L (ref 22–32)
Calcium: 8.4 mg/dL — ABNORMAL LOW (ref 8.9–10.3)
Chloride: 105 mmol/L (ref 98–111)
Creatinine, Ser: 1.27 mg/dL — ABNORMAL HIGH (ref 0.44–1.00)
GFR calc Af Amer: 48 mL/min — ABNORMAL LOW (ref >60)
GFR calc non Af Amer: 41 mL/min — ABNORMAL LOW (ref >60)
Glucose, Bld: 100 mg/dL — ABNORMAL HIGH (ref 70–99)
Potassium: 4.4 mmol/L (ref 3.5–5.1)
Sodium: 139 mmol/L (ref 135–145)

## 2020-06-01 LAB — CBC
HCT: 41.8 % (ref 36.0–46.0)
Hemoglobin: 13.5 g/dL (ref 12.0–15.0)
MCH: 30.1 pg (ref 26.0–34.0)
MCHC: 32.3 g/dL (ref 30.0–36.0)
MCV: 93.3 fL (ref 80.0–100.0)
Platelets: 214 10*3/uL (ref 150–400)
RBC: 4.48 MIL/uL (ref 3.87–5.11)
RDW: 14.6 % (ref 11.5–15.5)
WBC: 4.3 10*3/uL (ref 4.0–10.5)
nRBC: 0 % (ref 0.0–0.2)

## 2020-06-01 LAB — GLUCOSE, CAPILLARY: Glucose-Capillary: 90 mg/dL (ref 70–99)

## 2020-06-01 NOTE — Telephone Encounter (Signed)
I was unable to locate a clearance note in Epic  Will send to MD to advise how long she should hold ASA? Surgery is 06/09/20

## 2020-06-01 NOTE — Telephone Encounter (Signed)
LVM for patient to return call to get follow up scheduled with Ellyn Hack from recall list

## 2020-06-01 NOTE — Telephone Encounter (Signed)
Typically be hold aspirin 5 to 7 days preop.  Depends if this can be open cholecystectomy or closed.  If open, would hold for 7 days, if closed/laparoscopic, hold 5 days.  Glenetta Hew, MD

## 2020-06-01 NOTE — Progress Notes (Addendum)
COVID Vaccine Completed: NO Date COVID Vaccine completed: COVID vaccine manufacturer: Pfizer    [employer]   PCP - Dr. Gaynelle Arabian Cardiologist - Dr. Glenetta Hew. The pt. Is ok for surgery as per note on 04/30/20: Epic.  Chest x-ray -  EKG -  Stress Test -  ECHO -  Cardiac Cath -   Sleep Study -  CPAP -   Fasting Blood Sugar -  Checks Blood Sugar _____ times a day  Blood Thinner Instructions: Aspirin Instructions: RN instructed pt. To call MD. For instructions on aspirin 81 mg. Last Dose:  Anesthesia review: Hx: HTN,Cardiomyopathy.  Patient denies shortness of breath, fever, cough and chest pain at PAT appointment   Patient verbalized understanding of instructions that were given to them at the PAT appointment. Patient was also instructed that they will need to review over the PAT instructions again at home before surgery.

## 2020-06-01 NOTE — Telephone Encounter (Signed)
Follow up    Pt said Dr. Ellyn Hack gave her clearance to get her galbladder procedure done. She would like to know how long she can hold her aspirin before procedure

## 2020-06-02 NOTE — Anesthesia Preprocedure Evaluation (Addendum)
Anesthesia Evaluation  Patient identified by MRN, date of birth, ID band Patient awake    Reviewed: Allergy & Precautions, NPO status , Patient's Chart, lab work & pertinent test results, reviewed documented beta blocker date and time   Airway Mallampati: II  TM Distance: >3 FB Neck ROM: Full    Dental no notable dental hx.    Pulmonary neg pulmonary ROS,    Pulmonary exam normal breath sounds clear to auscultation       Cardiovascular hypertension, Pt. on medications and Pt. on home beta blockers negative cardio ROS Normal cardiovascular exam Rhythm:Regular Rate:Normal     Neuro/Psych Anxiety negative neurological ROS  negative psych ROS   GI/Hepatic negative GI ROS, Neg liver ROS,   Endo/Other  negative endocrine ROS  Renal/GU negative Renal ROS  negative genitourinary   Musculoskeletal negative musculoskeletal ROS (+)   Abdominal (+) + obese,   Peds negative pediatric ROS (+)  Hematology negative hematology ROS (+)   Anesthesia Other Findings   Reproductive/Obstetrics negative OB ROS                            Anesthesia Physical Anesthesia Plan  ASA: II  Anesthesia Plan: General   Post-op Pain Management:    Induction: Intravenous  PONV Risk Score and Plan: 3 and Ondansetron, Dexamethasone, Midazolam and Treatment may vary due to age or medical condition  Airway Management Planned: Oral ETT  Additional Equipment:   Intra-op Plan:   Post-operative Plan: Extubation in OR  Informed Consent: I have reviewed the patients History and Physical, chart, labs and discussed the procedure including the risks, benefits and alternatives for the proposed anesthesia with the patient or authorized representative who has indicated his/her understanding and acceptance.     Dental advisory given  Plan Discussed with: CRNA  Anesthesia Plan Comments: (See PAT note 06/01/2020, Konrad Felix, PA-C)       Anesthesia Quick Evaluation

## 2020-06-02 NOTE — Progress Notes (Signed)
Anesthesia Chart Review   Case: [id number] Date/Time: 06/09/20 1315   Procedure: LAPAROSCOPIC CHOLECYSTECTOMY (N/A )   Anesthesia type: General   Pre-op diagnosis: SYMPTOMATIC GALLSTONES   Location: [facility] ROOM 01 / [facility]   Surgeons: Coralie Keens, MD      DISCUSSION:74 y.o. never smoker with h/o HTN, nonischemic cardiomyopathy (EF 50-55% on Echo 01/2020), symptomatic gallstones scheduled for above procedure 06/09/2020 with Dr. Coralie Keens.    Last seen by cardiology 09/30/2019.  Per OV note, nonischemic cardiomyopathy likely related to chemotherapy, EF now back to baseline.  Pt with dyspnea related to deconditioning, no CHF sx.  Per Dr. Ellyn Hack pt should be fine to proceed with cholecystectomy, can hold ASA 7 days if cholecystectomy is open or 5 days is closed/laparoscopic.    Anticipate pt can proceed with planned procedure barring acute status change.   VS: BP 114/64   Pulse 88   Temp 36.7 C (Oral)   Resp 18   Ht _0  (1.651 m)   Wt 102.1 kg   SpO2 96%   BMI 37.46 kg/m   PROVIDERS: Gaynelle Arabian, MD is PCP   Glenetta Hew, MD is Cardiologist  LABS: Labs reviewed: Acceptable for surgery. (all labs ordered are listed, but only abnormal results are displayed)  Labs Reviewed  BASIC METABOLIC PANEL - Abnormal; Notable for the following components:      Result Value   Glucose, Bld 100 (*)    Creatinine, Ser 1.27 (*)    Calcium 8.4 (*)    GFR calc non Af Amer 41 (*)    GFR calc Af Amer 48 (*)    All other components within normal limits  CBC  GLUCOSE, CAPILLARY     IMAGES:   EKG: 06/01/2020 Rate 85 bpm  Normal sinus rhythm Left axis deviation Possible Anterior infarct , age undetermined Borderline LVH by voltage Non-specific intra-ventricular conduction delay Abnormal ECG suspect reversal of V2 & V3 due to abnormal R wave progression. No significant change since last tracing  CV: Echo 02/17/2020 FINDINGS  Left Ventricle: Left ventricular ejection  fraction, by estimation, is 50  to 55%. The left ventricle has low normal function. Definity contrast  agent was given IV to delineate the left ventricular endocardial borders.   Myocardial Perfusion 05/01/2014 Overall Impression:  Low risk stress nuclear study Apical thinning wothout ischemia.  Past Medical History:  Diagnosis Date  . Anxiety   . Breast cancer ([place]) 1998   (Rt) lumpectomy dx 1999; Dr. Benay Spice  . Dyslipidemia, goal LDL below 130   . Essential hypertension   . Lymphoma ([place])    lymphoma dx 11/28/10 - left neck  . non hodgkins lymphoma 12/2010  . Nonischemic cardiomyopathy ([place]) 2008   ? Doxorubincin induced; essentially resolved as of echo in January 2014, current EF 50-55%. Grade 1 diastolic dysfunction.  . Obesity   . Severe obesity (BMI >= 40) ([place]) 05/21/2013   Improve to BMI of 39 by July 2015    Past Surgical History:  Procedure Laterality Date  . ABDOMINAL HYSTERECTOMY    . ANKLE FRACTURE SURGERY Left   . BREAST EXCISIONAL BIOPSY Left   . BREAST EXCISIONAL BIOPSY Right   . BREAST LUMPECTOMY Right 1998  . COLONOSCOPY    . LEFT HEART CATH AND CORONARY ANGIOGRAPHY  March 2009    None coronary disease, EF 45% (up from nuclear study EF of 30% in June '08)  . NM MYOVIEW LTD  03/29/2007   EF 33%,NEGATIVE ISCHEMIA, prob need  cath  . NM MYOVIEW LTD  05/01/2014   Lexiscan: EF 55%. Normal wall motion; no ischemia or infarction; apical thinning  . PORTACATH PLACEMENT  August 25, 2013   rt. with tip in cavoatrial junction, Dr.Yamagata   . TRANSTHORACIC ECHOCARDIOGRAM  05/01/2014   Normal LV size with low normal function. EF of 50-55% and Gr 1 DD; aortic sclerosis without stenosis. MAC and thickening/calcification of the anterior leaflet - no notable AI / AS or MR/MS    MEDICATIONS: . acetaminophen (TYLENOL) 500 MG tablet  . aspirin EC 81 MG tablet  . LORazepam (ATIVAN) 0.5 MG tablet  . losartan-hydrochlorothiazide (HYZAAR) 50-12.5 MG per tablet  . metoprolol  tartrate (LOPRESSOR) 50 MG tablet   No current facility-administered medications for this encounter.     Konrad Felix, PA-C [facility]Surgical Testing [phone]

## 2020-06-02 NOTE — Telephone Encounter (Signed)
Patient aware of MD advice concerning ASA. E-faxed via Epic to surgeon and [facility] pre-op nurse per patient's request

## 2020-06-05 ENCOUNTER — Other Ambulatory Visit (HOSPITAL_COMMUNITY)
Admission: RE | Admit: 2020-06-05 | Discharge: 2020-06-05 | Disposition: A | Discharge status: Home / Self Care | Payer: Medicare Other | Source: Ambulatory Visit | Attending: Surgery | Admitting: Surgery

## 2020-06-05 DIAGNOSIS — Z01812 Encounter for preprocedural laboratory examination: Secondary | ICD-10-CM | POA: Insufficient documentation

## 2020-06-05 DIAGNOSIS — Z20822 Contact with and (suspected) exposure to covid-19: Secondary | ICD-10-CM | POA: Insufficient documentation

## 2020-06-05 LAB — SARS CORONAVIRUS 2 (TAT 6-24 HRS): SARS Coronavirus 2: NEGATIVE

## 2020-06-08 MED ORDER — DEXTROSE 5 % IV SOLN
3.0000 g | INTRAVENOUS | Status: AC
  Administered 2020-06-09: 3 g via INTRAVENOUS
  Filled 2020-06-08: qty 3

## 2020-06-08 NOTE — H&P (Signed)
Crystal [name]  Location: [facility] Surgery Patient #: [id number] DOB: 1946/09/18 Widowed / Language: [name] / Race: White Female   History of Present Illness The patient is a 74 year old female who presents for evaluation of gall stones.  Chief complaint: Symptomatic gallstones  This is a 74 year old female referred for evaluation for symptomatic gallstones. She had an attack of right upper quadrant abdominal pain. She thought it was a kidney stone attacks that she presented to urologist. This was on 6/22. She had a CT scan showing a stone in the neck of the gallbladder was some edema. She was told to go to the emergency department. As there was a long wait at the emergency department she left. Since then, she has had only mild intermittent discomfort. She has no nausea or vomiting. She denies fevers. She has a history of previous chemotherapy for lymphoma and has a decreased cardiac ejection fraction on recent echo cardiogram. Currently she is having some mild loose bowel movements. Again, her pain currently is minimal.   Past Surgical History Breast Biopsy  Bilateral. multiple Breast Mass; Local Excision  Right. Foot Surgery  Right. Hysterectomy (due to cancer) - Complete   Diagnostic Studies History ) Colonoscopy  5-10 years ago Mammogram  within last year  Allergies  Codeine/Codeine Derivatives  Allergies Reconciled   Medication History Losartan Potassium-HCTZ (50-12.5MG  Tablet, Oral) Active. Metoprolol Tartrate (50MG  Tablet, Oral) Active. LORazepam (0.5MG  Tablet, Oral) Active. Medications Reconciled  Social History Alcohol use  Moderate alcohol use. Caffeine use  Coffee. No drug use  Tobacco use  Never smoker.  Family History Breast Cancer  Sister. Colon Cancer  Brother. Heart Disease  Father, Mother. Hypertension  Mother, Sister, Pandora Leiter. Kidney Disease  Mother.  Pregnancy / Birth History Age at menarche  3 years. Age of  menopause  91-55 Gravida  3 Maternal age  70-25 Para  3  Other Problems Breast Cancer  Gastroesophageal Reflux Disease  Heart murmur  High blood pressure     Review of Systems General Present- Fatigue and Fever. Not Present- Appetite Loss, Chills, Night Sweats, Weight Gain and Weight Loss. Skin Not Present- Change in Wart/Mole, Dryness, Hives, Jaundice, New Lesions, Non-Healing Wounds, Rash and Ulcer. HEENT Not Present- Earache, Hearing Loss, Hoarseness, Nose Bleed, Oral Ulcers, Ringing in the Ears, Seasonal Allergies, Sinus Pain, Sore Throat, Visual Disturbances, Wears glasses/contact lenses and Yellow Eyes. Respiratory Not Present- Bloody sputum, Chronic Cough, Difficulty Breathing, Snoring and Wheezing. Breast Not Present- Breast Mass, Breast Pain, Nipple Discharge and Skin Changes. Cardiovascular Present- Chest Pain and Shortness of Breath. Not Present- Difficulty Breathing Lying Down, Leg Cramps, Palpitations, Rapid Heart Rate and Swelling of Extremities. Gastrointestinal Present- Abdominal Pain, Bloating, Change in Bowel Habits, Indigestion, Nausea and Vomiting. Not Present- Bloody Stool, Chronic diarrhea, Constipation, Difficulty Swallowing, Excessive gas, Gets full quickly at meals, Hemorrhoids and Rectal Pain. Female Genitourinary Present- Frequency and Nocturia. Not Present- Painful Urination, Pelvic Pain and Urgency. Musculoskeletal Present- Back Pain. Not Present- Joint Pain, Joint Stiffness, Muscle Pain, Muscle Weakness and Swelling of Extremities. Neurological Present- Headaches. Not Present- Decreased Memory, Fainting, Numbness, Seizures, Tingling, Tremor, Trouble walking and Weakness. Psychiatric Not Present- Anxiety, Bipolar, Change in Sleep Pattern, Depression, Fearful and Frequent crying. Endocrine Present- Hot flashes. Not Present- Cold Intolerance, Excessive Hunger, Hair Changes, Heat Intolerance and New Diabetes. Hematology Not Present- Blood Thinners, Easy  Bruising, Excessive bleeding, Gland problems, HIV and Persistent Infections.  Vitals  Weight: 223.2 lb Height: 65in Body Surface Area: 2.07 m Body  Mass Index: 37.14 kg/m  Temp.: 97.86F (Tympanic)  Pulse: 113 (Regular)  BP: 132/80(Sitting, Left Arm, Standard)   Physical Exam The physical exam findings are as follows: Note: On exam, she appears well and comfortable   Her abdomen is obese. She has minimal tenderness in the right quadrant with minimal guarding. There are no hernias. There is no hepatomegaly.    Assessment & Plan  SYMPTOMATIC CHOLELITHIASIS (K80.20)  Impression: I reviewed her notes in Epic. I also reviewed her CAT scan of the abdomen and pelvis showing the gallstone in the gallbladder neck. I discussed the diagnosis is a patient and her daughter. Clinically, she is improved size suspect this stone has moved. I recommend proceeding with a laparoscopic cholecystectomy. I discussed procedure with her in detail and gave her literature regarding. We discussed the risks of surgery which includes but is not limited to bleeding, infection, injury to surrounding structures, bile leak, the need to convert to an open procedure, cardiopulmonary issues, etc. She will need clearance from her cardiologist secondary to her decreased ejection fraction prior to undergoing general anesthesia. I will go ahead and place her on some oral antibiotics as she may have some very mild cholecystitis. Between now and the time of surgery if she develops worsening abdominal pain I told her she would need to go straight to the emergency department for further evaluation. She had her daughter agree with plans. I also recommended she stay on a strict low-fat diet.

## 2020-06-09 ENCOUNTER — Encounter (HOSPITAL_COMMUNITY)
Admission: RE | Disposition: A | Discharge status: Home / Self Care | Payer: Self-pay | Source: Home / Self Care | Attending: Surgery

## 2020-06-09 ENCOUNTER — Ambulatory Visit (HOSPITAL_COMMUNITY): Payer: Medicare Other | Admitting: Anesthesiology

## 2020-06-09 ENCOUNTER — Ambulatory Visit (HOSPITAL_COMMUNITY): Payer: Medicare Other | Admitting: Physician Assistant

## 2020-06-09 ENCOUNTER — Encounter (HOSPITAL_COMMUNITY): Payer: Self-pay | Admitting: Surgery

## 2020-06-09 ENCOUNTER — Observation Stay (HOSPITAL_COMMUNITY)
Admission: RE | Admit: 2020-06-09 | Discharge: 2020-06-10 | Disposition: A | Discharge status: Home / Self Care | Payer: Medicare Other | Attending: Surgery | Admitting: Surgery

## 2020-06-09 DIAGNOSIS — I428 Other cardiomyopathies: Secondary | ICD-10-CM | POA: Diagnosis not present

## 2020-06-09 DIAGNOSIS — E785 Hyperlipidemia, unspecified: Secondary | ICD-10-CM | POA: Diagnosis not present

## 2020-06-09 DIAGNOSIS — K8020 Calculus of gallbladder without cholecystitis without obstruction: Secondary | ICD-10-CM | POA: Diagnosis present

## 2020-06-09 DIAGNOSIS — K8010 Calculus of gallbladder with chronic cholecystitis without obstruction: Secondary | ICD-10-CM | POA: Diagnosis not present

## 2020-06-09 DIAGNOSIS — Z9049 Acquired absence of other specified parts of digestive tract: Secondary | ICD-10-CM

## 2020-06-09 DIAGNOSIS — E669 Obesity, unspecified: Secondary | ICD-10-CM | POA: Diagnosis not present

## 2020-06-09 DIAGNOSIS — Z6837 Body mass index (BMI) 37.0-37.9, adult: Secondary | ICD-10-CM | POA: Diagnosis not present

## 2020-06-09 DIAGNOSIS — I10 Essential (primary) hypertension: Secondary | ICD-10-CM | POA: Insufficient documentation

## 2020-06-09 HISTORY — PX: CHOLECYSTECTOMY: SHX55

## 2020-06-09 SURGERY — LAPAROSCOPIC CHOLECYSTECTOMY
Anesthesia: General

## 2020-06-09 MED ORDER — FENTANYL CITRATE (PF) 100 MCG/2ML IJ SOLN
INTRAMUSCULAR | Status: AC
  Filled 2020-06-09: qty 2

## 2020-06-09 MED ORDER — SUGAMMADEX SODIUM 200 MG/2ML IV SOLN
INTRAVENOUS | Status: DC | PRN
  Administered 2020-06-09: 300 mg via INTRAVENOUS

## 2020-06-09 MED ORDER — EPHEDRINE 5 MG/ML INJ
INTRAVENOUS | Status: AC
  Filled 2020-06-09: qty 10

## 2020-06-09 MED ORDER — ENSURE PRE-SURGERY PO LIQD: 296.0000 mL | Freq: Once | ORAL | Status: DC

## 2020-06-09 MED ORDER — CHLORHEXIDINE GLUCONATE CLOTH 2 % EX PADS: 6.0000 | MEDICATED_PAD | Freq: Once | CUTANEOUS | Status: DC

## 2020-06-09 MED ORDER — PANTOPRAZOLE SODIUM 40 MG IV SOLR
40.0000 mg | Freq: Every day | INTRAVENOUS | Status: DC
  Administered 2020-06-09: 40 mg via INTRAVENOUS
  Filled 2020-06-09: qty 40

## 2020-06-09 MED ORDER — DIPHENHYDRAMINE HCL 12.5 MG/5ML PO ELIX
12.5000 mg | ORAL_SOLUTION | Freq: Four times a day (QID) | ORAL | Status: DC | PRN
  Filled 2020-06-09: qty 5

## 2020-06-09 MED ORDER — ACETAMINOPHEN 500 MG PO TABS
1000.0000 mg | ORAL_TABLET | ORAL | Status: AC
  Administered 2020-06-09: 1000 mg via ORAL
  Filled 2020-06-09: qty 2

## 2020-06-09 MED ORDER — BUPIVACAINE HCL (PF) 0.5 % IJ SOLN
INTRAMUSCULAR | Status: AC
  Filled 2020-06-09: qty 30

## 2020-06-09 MED ORDER — TRAMADOL HCL 50 MG PO TABS: 50.0000 mg | ORAL_TABLET | Freq: Four times a day (QID) | ORAL | 0 refills | Status: DC | PRN

## 2020-06-09 MED ORDER — HYDROMORPHONE HCL 1 MG/ML IJ SOLN
0.2500 mg | INTRAMUSCULAR | Status: DC | PRN
  Administered 2020-06-09: 0.5 mg via INTRAVENOUS

## 2020-06-09 MED ORDER — SODIUM CHLORIDE 0.9 % IV SOLN: INTRAVENOUS | Status: DC

## 2020-06-09 MED ORDER — BUPIVACAINE HCL (PF) 0.5 % IJ SOLN
INTRAMUSCULAR | Status: DC | PRN
  Administered 2020-06-09: 22 mL

## 2020-06-09 MED ORDER — OXYCODONE HCL 5 MG PO TABS: 5.0000 mg | ORAL_TABLET | Freq: Once | ORAL | Status: DC | PRN

## 2020-06-09 MED ORDER — ALBUMIN HUMAN 5 % IV SOLN
12.5000 g | Freq: Once | INTRAVENOUS | Status: AC
  Administered 2020-06-09: 12.5 g via INTRAVENOUS

## 2020-06-09 MED ORDER — AMISULPRIDE (ANTIEMETIC) 5 MG/2ML IV SOLN
10.0000 mg | Freq: Once | INTRAVENOUS | Status: AC | PRN
  Administered 2020-06-09: 10 mg via INTRAVENOUS

## 2020-06-09 MED ORDER — SIMETHICONE 80 MG PO CHEW: 40.0000 mg | CHEWABLE_TABLET | Freq: Four times a day (QID) | ORAL | Status: DC | PRN

## 2020-06-09 MED ORDER — HYDRALAZINE HCL 20 MG/ML IJ SOLN: 10.0000 mg | INTRAMUSCULAR | Status: DC | PRN

## 2020-06-09 MED ORDER — ACETAMINOPHEN 325 MG PO TABS: 650.0000 mg | ORAL_TABLET | Freq: Four times a day (QID) | ORAL | Status: DC | PRN

## 2020-06-09 MED ORDER — PROPOFOL 10 MG/ML IV BOLUS
INTRAVENOUS | Status: DC | PRN
  Administered 2020-06-09: 90 mg via INTRAVENOUS

## 2020-06-09 MED ORDER — PROMETHAZINE HCL 25 MG/ML IJ SOLN: 6.2500 mg | INTRAMUSCULAR | Status: DC | PRN

## 2020-06-09 MED ORDER — ENOXAPARIN SODIUM 40 MG/0.4ML ~~LOC~~ SOLN
40.0000 mg | SUBCUTANEOUS | Status: DC
  Administered 2020-06-10: 40 mg via SUBCUTANEOUS
  Filled 2020-06-09: qty 0.4

## 2020-06-09 MED ORDER — GABAPENTIN 300 MG PO CAPS
300.0000 mg | ORAL_CAPSULE | ORAL | Status: AC
  Administered 2020-06-09: 300 mg via ORAL
  Filled 2020-06-09: qty 1

## 2020-06-09 MED ORDER — ACETAMINOPHEN 650 MG RE SUPP
650.0000 mg | Freq: Four times a day (QID) | RECTAL | Status: DC | PRN
  Filled 2020-06-09: qty 1

## 2020-06-09 MED ORDER — AMISULPRIDE (ANTIEMETIC) 5 MG/2ML IV SOLN
INTRAVENOUS | Status: AC
  Filled 2020-06-09: qty 4

## 2020-06-09 MED ORDER — OXYCODONE HCL 5 MG PO TABS
5.0000 mg | ORAL_TABLET | ORAL | Status: DC | PRN
  Administered 2020-06-09: 10 mg via ORAL
  Filled 2020-06-09: qty 2

## 2020-06-09 MED ORDER — LIDOCAINE 2% (20 MG/ML) 5 ML SYRINGE
INTRAMUSCULAR | Status: DC | PRN
  Administered 2020-06-09: 80 mg via INTRAVENOUS

## 2020-06-09 MED ORDER — 0.9 % SODIUM CHLORIDE (POUR BTL) OPTIME
TOPICAL | Status: DC | PRN
  Administered 2020-06-09: 1000 mL

## 2020-06-09 MED ORDER — HYDROMORPHONE HCL 1 MG/ML IJ SOLN
INTRAMUSCULAR | Status: AC
  Filled 2020-06-09: qty 1

## 2020-06-09 MED ORDER — CHLORHEXIDINE GLUCONATE 0.12 % MT SOLN
15.0000 mL | Freq: Once | OROMUCOSAL | Status: AC
  Administered 2020-06-09: 15 mL via OROMUCOSAL

## 2020-06-09 MED ORDER — ONDANSETRON HCL 4 MG/2ML IJ SOLN: 4.0000 mg | Freq: Four times a day (QID) | INTRAMUSCULAR | Status: DC | PRN

## 2020-06-09 MED ORDER — MORPHINE SULFATE (PF) 4 MG/ML IV SOLN
2.0000 mg | INTRAVENOUS | Status: DC | PRN
  Administered 2020-06-09: 2 mg via INTRAVENOUS
  Filled 2020-06-09: qty 1

## 2020-06-09 MED ORDER — EPHEDRINE SULFATE-NACL 50-0.9 MG/10ML-% IV SOSY
PREFILLED_SYRINGE | INTRAVENOUS | Status: DC | PRN
  Administered 2020-06-09 (×3): 10 mg via INTRAVENOUS

## 2020-06-09 MED ORDER — METHOCARBAMOL 500 MG PO TABS: 500.0000 mg | ORAL_TABLET | Freq: Four times a day (QID) | ORAL | Status: DC | PRN

## 2020-06-09 MED ORDER — ALBUMIN HUMAN 5 % IV SOLN
INTRAVENOUS | Status: AC
  Filled 2020-06-09: qty 250

## 2020-06-09 MED ORDER — FENTANYL CITRATE (PF) 100 MCG/2ML IJ SOLN
INTRAMUSCULAR | Status: DC | PRN
  Administered 2020-06-09: 100 ug via INTRAVENOUS
  Administered 2020-06-09 (×2): 50 ug via INTRAVENOUS

## 2020-06-09 MED ORDER — DIPHENHYDRAMINE HCL 50 MG/ML IJ SOLN: 12.5000 mg | Freq: Four times a day (QID) | INTRAMUSCULAR | Status: DC | PRN

## 2020-06-09 MED ORDER — POLYETHYLENE GLYCOL 3350 17 G PO PACK: 17.0000 g | PACK | Freq: Every day | ORAL | Status: DC | PRN

## 2020-06-09 MED ORDER — SODIUM CHLORIDE 0.9 % IR SOLN
Status: DC | PRN
  Administered 2020-06-09: 1000 mL

## 2020-06-09 MED ORDER — ONDANSETRON 4 MG PO TBDP: 4.0000 mg | ORAL_TABLET | Freq: Four times a day (QID) | ORAL | Status: DC | PRN

## 2020-06-09 MED ORDER — ESMOLOL HCL 100 MG/10ML IV SOLN
INTRAVENOUS | Status: AC
  Filled 2020-06-09: qty 10

## 2020-06-09 MED ORDER — ONDANSETRON HCL 4 MG/2ML IJ SOLN
INTRAMUSCULAR | Status: DC | PRN
  Administered 2020-06-09: 4 mg via INTRAVENOUS

## 2020-06-09 MED ORDER — AMISULPRIDE (ANTIEMETIC) 5 MG/2ML IV SOLN: 10.0000 mg | Freq: Once | INTRAVENOUS | Status: DC

## 2020-06-09 MED ORDER — LACTATED RINGERS IV SOLN
INTRAVENOUS | Status: DC
  Administered 2020-06-09: 1000 mL via INTRAVENOUS

## 2020-06-09 MED ORDER — PHENYLEPHRINE 40 MCG/ML (10ML) SYRINGE FOR IV PUSH (FOR BLOOD PRESSURE SUPPORT)
PREFILLED_SYRINGE | INTRAVENOUS | Status: AC
  Filled 2020-06-09: qty 10

## 2020-06-09 MED ORDER — ORAL CARE MOUTH RINSE: 15.0000 mL | Freq: Once | OROMUCOSAL | Status: AC

## 2020-06-09 MED ORDER — DEXAMETHASONE SODIUM PHOSPHATE 10 MG/ML IJ SOLN
INTRAMUSCULAR | Status: DC | PRN
  Administered 2020-06-09: 4 mg via INTRAVENOUS

## 2020-06-09 MED ORDER — DOCUSATE SODIUM 100 MG PO CAPS
100.0000 mg | ORAL_CAPSULE | Freq: Two times a day (BID) | ORAL | Status: DC
  Administered 2020-06-09: 100 mg via ORAL
  Filled 2020-06-09: qty 1

## 2020-06-09 MED ORDER — OXYCODONE HCL 5 MG/5ML PO SOLN: 5.0000 mg | Freq: Once | ORAL | Status: DC | PRN

## 2020-06-09 MED ORDER — ROCURONIUM BROMIDE 10 MG/ML (PF) SYRINGE
PREFILLED_SYRINGE | INTRAVENOUS | Status: DC | PRN
  Administered 2020-06-09: 50 mg via INTRAVENOUS

## 2020-06-09 MED ORDER — PHENYLEPHRINE 40 MCG/ML (10ML) SYRINGE FOR IV PUSH (FOR BLOOD PRESSURE SUPPORT)
PREFILLED_SYRINGE | INTRAVENOUS | Status: DC | PRN
  Administered 2020-06-09: 80 ug via INTRAVENOUS

## 2020-06-09 SURGICAL SUPPLY — 33 items
ADH SKN CLS APL DERMABOND .7 (GAUZE/BANDAGES/DRESSINGS) ×1
APL PRP STRL LF DISP 70% ISPRP (MISCELLANEOUS) ×1
APPLIER CLIP 5 13 M/L LIGAMAX5 (MISCELLANEOUS) ×2
APR CLP MED LRG 5 ANG JAW (MISCELLANEOUS) ×1
BAG SPEC RTRVL LRG 6X4 10 (ENDOMECHANICALS) ×1
CABLE HIGH FREQUENCY MONO STRZ (ELECTRODE) ×2 IMPLANT
CHLORAPREP W/TINT 26 (MISCELLANEOUS) ×2 IMPLANT
CLIP APPLIE 5 13 M/L LIGAMAX5 (MISCELLANEOUS) ×1 IMPLANT
COVER MAYO STAND STRL (DRAPES) IMPLANT
COVER WAND RF STERILE (DRAPES) IMPLANT
DECANTER SPIKE VIAL GLASS SM (MISCELLANEOUS) ×2 IMPLANT
DERMABOND ADVANCED (GAUZE/BANDAGES/DRESSINGS) ×1
DERMABOND ADVANCED .7 DNX12 (GAUZE/BANDAGES/DRESSINGS) ×1 IMPLANT
DRAPE C-ARM 42X120 X-RAY (DRAPES) IMPLANT
ELECT REM PT RETURN 15FT ADLT (MISCELLANEOUS) ×2 IMPLANT
GOWN STRL REUS W/TWL XL LVL3 (GOWN DISPOSABLE) ×4 IMPLANT
HEMOSTAT SURGICEL 4X8 (HEMOSTASIS) IMPLANT
KIT BASIN OR (CUSTOM PROCEDURE TRAY) ×2 IMPLANT
KIT TURNOVER KIT A (KITS) IMPLANT
PENCIL SMOKE EVACUATOR (MISCELLANEOUS) IMPLANT
POUCH SPECIMEN RETRIEVAL 10MM (ENDOMECHANICALS) ×2 IMPLANT
SCISSORS LAP 5X35 DISP (ENDOMECHANICALS) ×2 IMPLANT
SET CHOLANGIOGRAPH MIX (MISCELLANEOUS) IMPLANT
SET IRRIG TUBING LAPAROSCOPIC (IRRIGATION / IRRIGATOR) ×2 IMPLANT
SET TUBE SMOKE EVAC HIGH FLOW (TUBING) ×2 IMPLANT
SLEEVE XCEL OPT CAN 5 100 (ENDOMECHANICALS) ×4 IMPLANT
SUT MNCRL AB 4-0 PS2 18 (SUTURE) ×2 IMPLANT
SUT VICRYL 0 UR6 27IN ABS (SUTURE) ×2 IMPLANT
TOWEL OR 17X26 10 PK STRL BLUE (TOWEL DISPOSABLE) ×2 IMPLANT
TOWEL OR NON WOVEN STRL DISP B (DISPOSABLE) ×2 IMPLANT
TRAY LAPAROSCOPIC (CUSTOM PROCEDURE TRAY) ×2 IMPLANT
TROCAR BLADELESS OPT 5 100 (ENDOMECHANICALS) ×2 IMPLANT
TROCAR XCEL BLUNT TIP 100MML (ENDOMECHANICALS) ×2 IMPLANT

## 2020-06-09 NOTE — Anesthesia Procedure Notes (Signed)
Procedure Name: Intubation Date/Time: 06/09/2020 1:08 PM Performed by: Lavina Hamman, CRNA Pre-anesthesia Checklist: Patient identified, Emergency Drugs available, Suction available, Patient being monitored and Timeout performed Patient Re-evaluated:Patient Re-evaluated prior to induction Oxygen Delivery Method: Circle system utilized Preoxygenation: Pre-oxygenation with 100% oxygen Induction Type: IV induction Ventilation: Mask ventilation without difficulty Laryngoscope Size: Mac and 3 Grade View: Grade I Tube type: Oral Tube size: 7.5 mm Number of attempts: 1 Airway Equipment and Method: Stylet Placement Confirmation: ETT inserted through vocal cords under direct vision,  positive ETCO2,  CO2 detector and breath sounds checked- equal and bilateral Secured at: 22 cm Tube secured with: Tape Dental Injury: Teeth and Oropharynx as per pre-operative assessment  Comments: ATOI

## 2020-06-09 NOTE — Discharge Instructions (Signed)
[facility] ______[facility] °LAPAROSCOPIC SURGERY: POST OP INSTRUCTIONS °Always review your discharge instruction sheet given to you by the facility where your surgery was performed. °IF YOU HAVE DISABILITY OR FAMILY LEAVE FORMS, YOU MUST BRING THEM TO THE OFFICE FOR PROCESSING.   °DO NOT GIVE THEM TO YOUR DOCTOR. ° °1. A prescription for pain medication may be given to you upon discharge.  Take your pain medication as prescribed, if needed.  If narcotic pain medicine is not needed, then you may take acetaminophen (Tylenol) or ibuprofen (Advil) as needed. °2. Take your usually prescribed medications unless otherwise directed. °3. If you need a refill on your pain medication, please contact your pharmacy.  They will contact our office to request authorization. Prescriptions will not be filled after 5pm or on week-ends. °4. You should follow a light diet the first few days after arrival home, such as soup and crackers, etc.  Be sure to include lots of fluids daily. °5. Most patients will experience some swelling and bruising in the area of the incisions.  Ice packs will help.  Swelling and bruising can take several days to resolve.  °6. It is common to experience some constipation if taking pain medication after surgery.  Increasing fluid intake and taking a stool softener (such as Colace) will usually help or prevent this problem from occurring.  A mild laxative (Milk of Magnesia or Miralax) should be taken according to package instructions if there are no bowel movements after 48 hours. °7. Unless discharge instructions indicate otherwise, you may remove your bandages 24-48 hours after surgery, and you may shower at that time.  You may have steri-strips (small skin tapes) in place directly over the incision.  These strips should be left on the skin for 7-10 days.  If your surgeon used skin glue on the incision, you may shower in 24 hours.  The glue will flake off over the next 2-3 weeks.  Any sutures or  staples will be removed at the office during your follow-up visit. °8. ACTIVITIES:  You may resume regular (light) daily activities beginning the next day--such as daily self-care, walking, climbing stairs--gradually increasing activities as tolerated.  You may have sexual intercourse when it is comfortable.  Refrain from any heavy lifting or straining until approved by your doctor. °a. You may drive when you are no longer taking prescription pain medication, you can comfortably wear a seatbelt, and you can safely maneuver your car and apply brakes. °b. RETURN TO WORK:  __________________________________________________________ °9. You should see your doctor in the office for a follow-up appointment approximately 2-3 weeks after your surgery.  Make sure that you call for this appointment within a day or two after you arrive home to insure a convenient appointment time. °10. OTHER INSTRUCTIONS:OK TO SHOWER STARTING TOMORROW °11. ICE PACK, TYLENOL, AND IBUPROFEN ALSO FOR PAIN °12. NO LIFTING MORE THAN 15 TO 20 POUNDS FOR 2 WEEKS __________________________________________________________________________________________________________________________ __________________________________________________________________________________________________________________________ °WHEN TO CALL YOUR DOCTOR: °1. Fever over 101.0 °2. Inability to urinate °3. Continued bleeding from incision. °4. Increased pain, redness, or drainage from the incision. °5. Increasing abdominal pain ° °The clinic staff is available to answer your questions during regular business hours.  Please don’t hesitate to call and ask to speak to one of the nurses for clinical concerns.  If you have a medical emergency, go to the nearest emergency room or call 911.  A surgeon from [facility] is always on call at the hospital. °[address],   [address], [place], [place]  [address] ? [address] [address], [place], [place]   [address] °[phone] ?  [phone] ? FAX [phone] °Web site: [email or web address] °

## 2020-06-09 NOTE — Progress Notes (Signed)
PACU note:  Patient recovered in PACU after Laparoscopic Cholecystectomy. Patient originally scheduled to go home. Due to complicated post-op managment, Dr. Ninfa Linden in agreement for patient to stay overnight.   - Patient experienced pain at surgical site, administered 0.5mg  Dilaudid. Patient became sleepy/ unable to maintain oxygen saturation and needed supplemental O2/ apneic episodes noted. BP stable, pain controlled.   - Patient became nauseous and vomited. Notified anesthesia (Dr. [name] Heck). Orders received for Barhemsys 10mg  IV. Patient felt relief from nausea.  - Patient continued to feel poorly and BP dropped to 80s/50s. Notified Dr. [name] Heck and orders received for 271ml Albumin. No signs of bleeding.   - Patient became tearful and did not feel comfortable going home today. Unable to wean from oxygen. After speaking with Dr. Ninfa Linden, received Admit orders for patient to stay overnight.   Report given to Terri, RN and patient transported to [place].  Floreen Comber, RN

## 2020-06-09 NOTE — Anesthesia Postprocedure Evaluation (Signed)
Anesthesia Post Note  Patient: Crystal [name]  Procedure(s) Performed: LAPAROSCOPIC CHOLECYSTECTOMY (N/A )     Patient location during evaluation: PACU Anesthesia Type: General Level of consciousness: awake and alert Pain management: pain level controlled Vital Signs Assessment: post-procedure vital signs reviewed and stable Respiratory status: spontaneous breathing, nonlabored ventilation and respiratory function stable Cardiovascular status: blood pressure returned to baseline and stable Postop Assessment: no apparent nausea or vomiting Anesthetic complications: no   No complications documented.  Last Vitals:  Vitals:   06/09/20 1445 06/09/20 1500  BP: 98/65 118/69  Pulse: 80 81  Resp: 16 16  Temp: (!) 36.4 C   SpO2: 96% 93%    Last Pain:  Vitals:   06/09/20 1500  TempSrc:   PainSc: 0-No pain                 Lynda Rainwater

## 2020-06-09 NOTE — Op Note (Signed)

## 2020-06-09 NOTE — Transfer of Care (Signed)
Immediate Anesthesia Transfer of Care Note  Patient: Crystal [name]  Procedure(s) Performed: Procedure(s): LAPAROSCOPIC CHOLECYSTECTOMY (N/A)  Patient Location: PACU  Anesthesia Type:General  Level of Consciousness:  sedated, patient cooperative and responds to stimulation  Airway & Oxygen Therapy:Patient Spontanous Breathing and Patient connected to face mask oxgen  Post-op Assessment:  Report given to PACU RN and Post -op Vital signs reviewed and stable  Post vital signs:  Reviewed and stable  Last Vitals:  Vitals:   06/09/20 1141 06/09/20 1341  BP: 139/83 (P) 125/86  Pulse: 79 97  Resp: 16 15  Temp: 36.9 C (P) 36.6 C  SpO2: 43% 60%    Complications: No apparent anesthesia complications

## 2020-06-09 NOTE — Interval H&P Note (Signed)
History and Physical Interval Note: no change in H and P  06/09/2020 11:57 AM  Crystal [name]  has presented today for surgery, with the diagnosis of SYMPTOMATIC GALLSTONES.  The various methods of treatment have been discussed with the patient and family. After consideration of risks, benefits and other options for treatment, the patient has consented to  Procedure(s): LAPAROSCOPIC CHOLECYSTECTOMY (N/A) as a surgical intervention.  The patient's history has been reviewed, patient examined, no change in status, stable for surgery.  I have reviewed the patient's chart and labs.  Questions were answered to the patient's satisfaction.     Coralie Keens

## 2020-06-10 ENCOUNTER — Other Ambulatory Visit: Payer: Self-pay

## 2020-06-10 ENCOUNTER — Encounter (HOSPITAL_COMMUNITY): Payer: Self-pay | Admitting: Surgery

## 2020-06-10 DIAGNOSIS — Z6837 Body mass index (BMI) 37.0-37.9, adult: Secondary | ICD-10-CM | POA: Diagnosis not present

## 2020-06-10 DIAGNOSIS — I10 Essential (primary) hypertension: Secondary | ICD-10-CM | POA: Diagnosis not present

## 2020-06-10 DIAGNOSIS — K8010 Calculus of gallbladder with chronic cholecystitis without obstruction: Secondary | ICD-10-CM | POA: Diagnosis not present

## 2020-06-10 DIAGNOSIS — E669 Obesity, unspecified: Secondary | ICD-10-CM | POA: Diagnosis not present

## 2020-06-10 LAB — SURGICAL PATHOLOGY

## 2020-06-10 NOTE — Discharge Summary (Signed)
Physician Discharge Summary  Patient ID: Crystal [name] MRN: [id number] DOB/AGE: Apr 20, 1946 74 y.o.  Admit date: 06/09/2020 Discharge date: 06/10/2020  Admission Diagnoses:  Discharge Diagnoses:  Active Problems:   Symptomatic cholelithiasis   S/P laparoscopic cholecystectomy   Discharged Condition: good  Hospital Course: uneventful post op recovery.  Discharged home POD#1  Consults: None  Significant Diagnostic Studies:   Treatments: surgery: lap chole  Discharge Exam: Blood pressure 108/63, pulse 99, temperature 98.5 F (36.9 C), resp. rate 18, height 5\' 5"  (1.651 m), weight 102.1 kg, SpO2 100 %. General appearance: alert, cooperative and no distress Resp: clear to auscultation bilaterally Cardio: regular rate and rhythm, S1, S2 normal, no murmur, click, rub or gallop Incision/Wound:abdomen soft, minimally tender  Disposition: Discharge disposition: 01-Home or Self Care       Discharge Instructions    Diet - low sodium heart healthy   Complete by: As directed    Increase activity slowly   Complete by: As directed      Allergies as of 06/10/2020      Reactions   Codeine Other (See Comments)   Bad Headaches   Coreg Other (See Comments)   "Made my legs hurt"   Lisinopril Cough      Medication List    TAKE these medications   acetaminophen 500 MG tablet Commonly known as: TYLENOL Take 500 mg by mouth every 6 (six) hours as needed for moderate pain.   aspirin EC 81 MG tablet Take 81 mg by mouth every morning.   LORazepam 0.5 MG tablet Commonly known as: ATIVAN TAKE 1 TABLET BY MOUTH TWICE DAILY AS NEEDED FOR ANXIETY What changed:   reasons to take this  additional instructions   losartan-hydrochlorothiazide 50-12.5 MG tablet Commonly known as: HYZAAR Take 1 tablet by mouth every morning.   metoprolol tartrate 50 MG tablet Commonly known as: LOPRESSOR TAKE 1 TABLET TWICE DAILY   traMADol 50 MG tablet Commonly known as: Ultram Take 1 tablet  (50 mg total) by mouth every 6 (six) hours as needed for moderate pain or severe pain.       Follow-up Information    Coralie Keens, MD In 3 weeks.   Specialty: General Surgery Contact information: [address] [address] [phone]               Signed: Coralie Keens 06/10/2020, 8:54 AM

## 2020-06-10 NOTE — Progress Notes (Signed)
Patient ID: Crystal [name], female   DOB: 1946-07-06, 74 y.o.   MRN: [id number]   Feels well today Abdomen soft Looks great  Plan: Discharge home

## 2020-07-02 ENCOUNTER — Other Ambulatory Visit: Payer: Self-pay | Admitting: Surgery

## 2020-07-09 MED FILL — LORazepam 0.5 MG TABS: 0.5 | 30 days supply | Qty: 60 | Fill #1

## 2020-08-03 ENCOUNTER — Telehealth: Payer: Self-pay | Admitting: Oncology

## 2020-08-03 NOTE — Telephone Encounter (Signed)
Rescheduled appointment per provider PAL schedule. Called patient, no answer. Left message for patient with appointment date and time.

## 2020-08-18 ENCOUNTER — Encounter (HOSPITAL_BASED_OUTPATIENT_CLINIC_OR_DEPARTMENT_OTHER): Payer: Self-pay | Admitting: Surgery

## 2020-08-18 ENCOUNTER — Other Ambulatory Visit: Payer: Self-pay

## 2020-08-18 NOTE — Progress Notes (Signed)
   08/18/20 1541  OBSTRUCTIVE SLEEP APNEA  Have you ever been diagnosed with sleep apnea through a sleep study? No  Do you snore loudly (loud enough to be heard through closed doors)?  1  Do you often feel tired, fatigued, or sleepy during the daytime (such as falling asleep during driving or talking to someone)? 0  Has anyone observed you stop breathing during your sleep? 0  Do you have, or are you being treated for high blood pressure? 1  BMI more than 35 kg/m2? 1  Age > 78 (1-yes) 1  Female Gender (Yes=1) 0  Obstructive Sleep Apnea Score 4  Score 5 or greater  Results sent to PCP

## 2020-08-19 ENCOUNTER — Encounter (HOSPITAL_BASED_OUTPATIENT_CLINIC_OR_DEPARTMENT_OTHER)
Admission: RE | Admit: 2020-08-19 | Discharge: 2020-08-19 | Disposition: A | Discharge status: Home / Self Care | Payer: Medicare Other | Source: Ambulatory Visit | Attending: Surgery | Admitting: Surgery

## 2020-08-19 ENCOUNTER — Encounter: Payer: Self-pay | Admitting: Cardiology

## 2020-08-19 ENCOUNTER — Ambulatory Visit (INDEPENDENT_AMBULATORY_CARE_PROVIDER_SITE_OTHER): Payer: Medicare Other | Admitting: Cardiology

## 2020-08-19 DIAGNOSIS — E668 Other obesity: Secondary | ICD-10-CM | POA: Diagnosis not present

## 2020-08-19 DIAGNOSIS — I428 Other cardiomyopathies: Secondary | ICD-10-CM

## 2020-08-19 DIAGNOSIS — Z01812 Encounter for preprocedural laboratory examination: Secondary | ICD-10-CM | POA: Diagnosis not present

## 2020-08-19 DIAGNOSIS — Z01810 Encounter for preprocedural cardiovascular examination: Secondary | ICD-10-CM

## 2020-08-19 DIAGNOSIS — R0609 Other forms of dyspnea: Secondary | ICD-10-CM

## 2020-08-19 DIAGNOSIS — E785 Hyperlipidemia, unspecified: Secondary | ICD-10-CM | POA: Diagnosis not present

## 2020-08-19 DIAGNOSIS — R0600 Dyspnea, unspecified: Secondary | ICD-10-CM | POA: Diagnosis not present

## 2020-08-19 DIAGNOSIS — I10 Essential (primary) hypertension: Secondary | ICD-10-CM

## 2020-08-19 LAB — BASIC METABOLIC PANEL
Anion gap: 10 (ref 5–15)
BUN: 17 mg/dL (ref 8–23)
CO2: 28 mmol/L (ref 22–32)
Calcium: 8.9 mg/dL (ref 8.9–10.3)
Chloride: 104 mmol/L (ref 98–111)
Creatinine, Ser: 1.23 mg/dL — ABNORMAL HIGH (ref 0.44–1.00)
GFR, Estimated: 46 mL/min — ABNORMAL LOW (ref >60)
Glucose, Bld: 98 mg/dL (ref 70–99)
Potassium: 3.7 mmol/L (ref 3.5–5.1)
Sodium: 142 mmol/L (ref 135–145)

## 2020-08-19 MED ORDER — ENSURE PRE-SURGERY PO LIQD: 296.0000 mL | Freq: Once | ORAL | Status: DC

## 2020-08-19 NOTE — Progress Notes (Signed)

## 2020-08-19 NOTE — Patient Instructions (Signed)
Medication Instructions:  No changes *If you need a refill on your cardiac medications before your next appointment, please call your pharmacy*   Lab Work: Not needed If you have labs (blood work) drawn today and your tests are completely normal, you will receive your results only by: Marland Kitchen MyChart Message (if you have MyChart) OR . A paper copy in the mail If you have any lab test that is abnormal or we need to change your treatment, we will call you to review the results.   Testing/Procedures: Not needed   Follow-Up: At [facility], you and your health needs are our priority.  As part of our continuing mission to provide you with exceptional heart care, we have created designated Provider Care Teams.  These Care Teams include your primary Cardiologist (physician) and Advanced Practice Providers (APPs -  Physician Assistants and Nurse Practitioners) who all work together to provide you with the care you need, when you need it.  We recommend signing up for the patient portal called "MyChart".  Sign up information is provided on this After Visit Summary.  MyChart is used to connect with patients for Virtual Visits (Telemedicine).  Patients are able to view lab/test results, encounter notes, upcoming appointments, etc.  Non-urgent messages can be sent to your provider as well.   To learn more about what you can do with MyChart, go to [email or web address].    Your next appointment:   12 month(s)  The format for your next appointment:   In Person  Provider:   Glenetta Hew, MD   Other Instructions  you are cleared for breast surgery ,  Continue to take  Metoprolol , the morning of surgery do not take Losartan/hctz. Stop taking Aspirin 5 days before surgery.

## 2020-08-19 NOTE — Progress Notes (Signed)
Primary Care Provider: Gaynelle Arabian, MD Cardiologist: Glenetta Hew, MD Electrophysiologist: None  Clinic Note: Chief Complaint  Patient presents with  . Follow-up    Doing well  . Pre-op Exam    Upcoming breast surgery    HPI:    Crystal [name] is a 74 y.o. female with a PMH notable for non-Hodgkin's Lymphoma complicated by NONISCHEMIC CARDIOMYOPATHY (subsequently resolved) who presents today for follow-up of early annual follow-up as a preop visit..  Problem List Items Addressed This Visit    Moderate obesity (Chronic)    The patient understands the need to lose weight with diet and exercise. We have discussed specific strategies for this.      Nonischemic cardiomyopathy (HCC) (Chronic)    Most recent echo back to 50 to 55%.  No regional wall motion abnormalities.  No active heart failure symptoms.  Crystal [name] is on stable dose of beta-blocker and combo losartan-HCTZ.  Relatively euvolemic.  Perioperatively, would probably hold the losartan-HCT dose the morning of surgery but would continue beta-blocker.      Relevant Medications   metoprolol tartrate (LOPRESSOR) 50 MG tablet   Essential hypertension (Chronic)    Well-controlled on current meds.  No change  With the potential for her being on medications associated with nausea to treat breast cancer, I did recommend that if Crystal [name] were to have poor p.o. intake both food and water, would be susceptible to hold the losartan-HCTZ intermittently.      Relevant Medications   metoprolol tartrate (LOPRESSOR) 50 MG tablet   Dyslipidemia, goal LDL below 100 (Chronic)    Last labs were from last December.  LDL is 110.  Crystal [name] is not on any medications.  Relatively acceptable based on her existing level of risk.  With her upcoming breast cancer treatments, would hold off on considering initiation of statin.      Relevant Medications   metoprolol tartrate (LOPRESSOR) 50 MG tablet   Dyspnea on exertion (Chronic)    Pretty much  stable.  No major changes.  Likely related to combination of deconditioning and obesity.  EF is now is back to normal with maybe some mild diastolic dysfunction.  Recommend continued exercise and weight loss.      Preop cardiovascular exam    Patient was pending breast surgery.  No active cardiac symptoms.-Either angina or heart failure.  Crystal [name] is nondiabetic with normal renal function.  Breast surgery is low risk with surgery.  Crystal [name] would be considered relatively low risk for low risk surgery.  No further cardiac evaluation required.  Okay to hold aspirin 5 days preop.  Continue beta-blocker but hold losartan-HCTZ the morning of surgery.        MIREL HUNDAL was last seen on December 8 via telemedicine.  No real change in exertional dyspnea.  Just a little bit short of breath.  None recent.  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  Crystal [name] does get exertional dyspnea when Crystal [name] walks around [place] shopping for a while.  Crystal [name] actually does go to [employer] or other stores to get exercise walking because Crystal [name] was unable to get back to the [facility] due to COVID-19. -> Troubled by cough with some mild additional dyspnea.  Otherwise doing well.  2D echo ordered  Recent Hospitalizations:   8/18-19/2021: Symptomatic cholelithiasis- s/p laparoscopic cholecystectomy.  Discharge POD 1.  Reviewed  CV studies:    The following studies were reviewed today: (if available, images/films reviewed: From  Epic Chart or Care Everywhere) . Limited TTE 02/17/2020: (This was to limited study with Definity contrast to complete study initially done on 02/03/2020) EF 50 to 55%.  Low normal EF with normal RWMA.  No aortic stenosis.  No mitral stenosis or significant regurgitation.  Normal RV size and function, with normal suggested PA pressures  Interval History:   Crystal [name] is here today somewhat for breast lumpectomy.  Crystal [name] does have a cholecystectomy as well.   Crystal [name] held aspirin for that surgery that went well.  Crystal [name] pretty much has stable mild exertional dyspnea.  No change.Marland Kitchen  No chest pain depression pressure exertion.  Mild nonpitting edema.  CV Review of Symptoms (Summary): positive for - dyspnea on exertion, edema and Relatively stable negative for - chest pain, irregular heartbeat, orthopnea, palpitations, paroxysmal nocturnal dyspnea, rapid heart rate, shortness of breath or Lightheadedness or dizziness, syncope/near syncope, TIA/amaurosis fugax, claudication  The patient does not have symptoms concerning for COVID-19 infection (fever, chills, cough, or new shortness of breath).   REVIEWED OF SYSTEMS   Review of Systems  Constitutional: Negative for malaise/fatigue and weight loss.  HENT: Negative for congestion.   Respiratory: Negative for cough and shortness of breath.   Cardiovascular: Negative for claudication.  Gastrointestinal: Negative for blood in stool and melena.       Her bowels are still somewhat normalizing after cholecystectomy.  Genitourinary: Negative for hematuria.  Musculoskeletal: Positive for back pain and joint pain. Negative for falls.  Neurological: Negative for dizziness and headaches.  Psychiatric/Behavioral: Negative.     I have reviewed and (if needed) personally updated the patient's problem list, medications, allergies, past medical and surgical history, social and family history.   PAST MEDICAL HISTORY   Past Medical History:  Diagnosis Date  . Anxiety   . Breast cancer ([place]) 1998   (Rt) lumpectomy dx 1999; Dr. Benay Spice  . Dyslipidemia, goal LDL below 130   . Essential hypertension   . Lymphoma ([place])    lymphoma dx 11/28/10 - left neck  . non hodgkins lymphoma 12/2010  . Nonischemic cardiomyopathy ([place]) 2008   ? Doxorubincin induced; essentially resolved as of echo in January 2014, current EF 50-55%. Grade 1 diastolic dysfunction.  . Obesity   . Severe obesity (BMI >= 40) ([place]) 05/21/2013   Improve to  BMI of 39 by July 2015    PAST SURGICAL HISTORY   Past Surgical History:  Procedure Laterality Date  . ABDOMINAL HYSTERECTOMY    . ANKLE FRACTURE SURGERY Left   . BREAST EXCISIONAL BIOPSY Left   . BREAST EXCISIONAL BIOPSY Right   . BREAST LUMPECTOMY Right 1998  . BREAST LUMPECTOMY Right 08/25/2020   Procedure: RIGHT BREAST LUMPECTOMY;  Surgeon: Coralie Keens, MD;  Location: [facility];  Service: General;  Laterality: Right;  . CHOLECYSTECTOMY N/A 06/09/2020   Procedure: LAPAROSCOPIC CHOLECYSTECTOMY;  Surgeon: Coralie Keens, MD;  Location: [facility];  Service: General;  Laterality: N/A;  . COLONOSCOPY    . LEFT HEART CATH AND CORONARY ANGIOGRAPHY  March 2009    None coronary disease, EF 45% (up from nuclear study EF of 30% in June '08)  . NM MYOVIEW LTD  03/29/2007   EF 33%,NEGATIVE ISCHEMIA, prob need cath  . NM MYOVIEW LTD  05/01/2014   Lexiscan: EF 55%. Normal wall motion; no ischemia or infarction; apical thinning  . PORTACATH PLACEMENT  August 25, 2013   rt. with tip in cavoatrial junction, Dr.Yamagata   . TRANSTHORACIC ECHOCARDIOGRAM  05/01/2014   Normal LV size with low normal function. EF of 50-55% and Gr 1 DD; aortic sclerosis without stenosis. MAC and thickening/calcification of the anterior leaflet - no notable AI / AS or MR/MS    Immunization History  Administered Date(s) Administered  . Influenza Split 07/10/2011, 07/13/2018  . Influenza,inj,Quad PF,6+ Mos 06/30/2014, 07/17/2016  . Influenza-Unspecified 07/24/2019  . Pneumococcal Conjugate-13 09/08/2011  . Pneumococcal Polysaccharide-23 05/11/2015    MEDICATIONS/ALLERGIES   Current Meds  Medication Sig  . acetaminophen (TYLENOL) 500 MG tablet Take 500 mg by mouth every 6 (six) hours as needed for moderate pain.  Marland Kitchen aspirin EC 81 MG tablet Take 81 mg by mouth every morning.  Marland Kitchen LORazepam (ATIVAN) 0.5 MG tablet Take 0.5 mg by mouth as directed.  Marland Kitchen losartan-hydrochlorothiazide (HYZAAR) 50-12.5  MG per tablet Take 1 tablet by mouth every morning.   . metoprolol tartrate (LOPRESSOR) 50 MG tablet Take 50 mg by mouth 2 (two) times daily.    Allergies  Allergen Reactions  . Codeine Other (See Comments)    Bad Headaches  . Coreg Other (See Comments)    "Made my legs hurt"  . Lisinopril Cough    SOCIAL HISTORY/FAMILY HISTORY   Reviewed in Epic:  Pertinent findings: No new changes  OBJCTIVE -PE, EKG, labs   Wt Readings from Last 3 Encounters:  08/25/20 219 lb 12.8 oz (99.7 kg)  08/19/20 219 lb 6.4 oz (99.5 kg)  06/09/20 225 lb (102.1 kg)    Physical Exam: BP 120/74   Pulse 82   Ht _0  (1.651 m)   Wt 219 lb 6.4 oz (99.5 kg)   SpO2 97%   BMI 36.51 kg/m  Physical Exam Vitals reviewed.  Constitutional:      General: Crystal [name] is not in acute distress.    Appearance: Normal appearance. Crystal [name] is obese. Crystal [name] is not ill-appearing or toxic-appearing.     Comments: Mildly obese.  Well-groomed.  HENT:     Head: Normocephalic and atraumatic.  Neck:     Vascular: No carotid bruit, hepatojugular reflux or JVD.  Cardiovascular:     Rate and Rhythm: Normal rate and regular rhythm. Occasional extrasystoles are present.    Chest Wall: PMI is not displaced.     Pulses: Intact distal pulses.     Heart sounds: Normal heart sounds. No murmur heard.  No friction rub. No gallop.   Pulmonary:     Effort: Pulmonary effort is normal. No respiratory distress.     Breath sounds: Normal breath sounds.  Chest:     Chest wall: No tenderness.  Musculoskeletal:        General: Swelling (Mild puffiness/nonpitting edema.) present. Normal range of motion.     Cervical back: Normal range of motion and neck supple.  Skin:    Comments: Mild bilateral LE spider veins  Neurological:     General: No focal deficit present.     Mental Status: Crystal [name] is alert and oriented to person, place, and time. Mental status is at baseline.  Psychiatric:        Mood and Affect: Mood normal.        Behavior: Behavior  normal.        Thought Content: Thought content normal.        Judgment: Judgment normal.     Adult ECG Report Not checked  Recent Labs: 10/23/2019: TC 172, TG 115, HDL 41, LDL 110.  A1c 5.8.-Due to be rechecked by PCP-appointment scheduled in January. No results found  for: CHOL, HDL, LDLCALC, LDLDIRECT, TRIG, CHOLHDL Lab Results  Component Value Date   CREATININE 1.23 (H) 08/19/2020   BUN 17 08/19/2020   NA 142 08/19/2020   K 3.7 08/19/2020   CL 104 08/19/2020   CO2 28 08/19/2020   Lab Results  Component Value Date   TSH 2.473 11/20/2014    ASSESSMENT/PLAN    Problem List Items Addressed This Visit    Moderate obesity (Chronic)    The patient understands the need to lose weight with diet and exercise. We have discussed specific strategies for this.      Nonischemic cardiomyopathy (HCC) (Chronic)    Most recent echo back to 50 to 55%.  No regional wall motion abnormalities.  No active heart failure symptoms.  Crystal [name] is on stable dose of beta-blocker and combo losartan-HCTZ.  Relatively euvolemic.  Perioperatively, would probably hold the losartan-HCT dose the morning of surgery but would continue beta-blocker.      Relevant Medications   metoprolol tartrate (LOPRESSOR) 50 MG tablet   Essential hypertension (Chronic)    Well-controlled on current meds.  No change  With the potential for her being on medications associated with nausea to treat breast cancer, I did recommend that if Crystal [name] were to have poor p.o. intake both food and water, would be susceptible to hold the losartan-HCTZ intermittently.      Relevant Medications   metoprolol tartrate (LOPRESSOR) 50 MG tablet   Dyslipidemia, goal LDL below 100 (Chronic)    Last labs were from last December.  LDL is 110.  Crystal [name] is not on any medications.  Relatively acceptable based on her existing level of risk.  With her upcoming breast cancer treatments, would hold off on considering initiation of statin.      Relevant  Medications   metoprolol tartrate (LOPRESSOR) 50 MG tablet   Dyspnea on exertion (Chronic)    Pretty much stable.  No major changes.  Likely related to combination of deconditioning and obesity.  EF is now is back to normal with maybe some mild diastolic dysfunction.  Recommend continued exercise and weight loss.      Preop cardiovascular exam    Patient was pending breast surgery.  No active cardiac symptoms.-Either angina or heart failure.  Crystal [name] is nondiabetic with normal renal function.  Breast surgery is low risk with surgery.  Crystal [name] would be considered relatively low risk for low risk surgery.  No further cardiac evaluation required.  Okay to hold aspirin 5 days preop.  Continue beta-blocker but hold losartan-HCTZ the morning of surgery.         COVID-19 Education: The signs and symptoms of COVID-19 were discussed with the patient and how to seek care for testing (follow up with PCP or arrange E-visit).   The importance of social distancing and COVID-19 vaccination was discussed today.  The patient is practicing social distancing & Masking.   I spent a total of 19 minutes with the patient spent in direct patient consultation.  Additional time spent with chart review  / charting (studies, outside notes, etc): 12  -> It has been over 2 years since I actually saw her in person.  Several clinic notes and hospitalizations reviewed. Total Time: 31 min   Current medicines are reviewed at length with the patient today.  (+/- concerns) n/a  This visit occurred during the SARS-CoV-2 public health emergency.  Safety protocols were in place, including screening questions prior to the visit, additional usage of staff PPE, and extensive cleaning of  exam room while observing appropriate contact time as indicated for disinfecting solutions.  Notice: This dictation was prepared with Dragon dictation along with smaller phrase technology. Any transcriptional errors that result from this process  are unintentional and may not be corrected upon review.  Patient Instructions / Medication Changes & Studies & Tests Ordered   Patient Instructions  Medication Instructions:  No changes *If you need a refill on your cardiac medications before your next appointment, please call your pharmacy*   Lab Work: Not needed If you have labs (blood work) drawn today and your tests are completely normal, you will receive your results only by: Marland Kitchen MyChart Message (if you have MyChart) OR . A paper copy in the mail If you have any lab test that is abnormal or we need to change your treatment, we will call you to review the results.   Testing/Procedures: Not needed   Follow-Up: At [facility], you and your health needs are our priority.  As part of our continuing mission to provide you with exceptional heart care, we have created designated Provider Care Teams.  These Care Teams include your primary Cardiologist (physician) and Advanced Practice Providers (APPs -  Physician Assistants and Nurse Practitioners) who all work together to provide you with the care you need, when you need it.  We recommend signing up for the patient portal called "MyChart".  Sign up information is provided on this After Visit Summary.  MyChart is used to connect with patients for Virtual Visits (Telemedicine).  Patients are able to view lab/test results, encounter notes, upcoming appointments, etc.  Non-urgent messages can be sent to your provider as well.   To learn more about what you can do with MyChart, go to [email or web address].    Your next appointment:   12 month(s)  The format for your next appointment:   In Person  Provider:   Glenetta Hew, MD   Other Instructions  you are cleared for breast surgery ,  Continue to take  Metoprolol , the morning of surgery do not take Losartan/hctz. Stop taking Aspirin 5 days before surgery.    Studies Ordered:   No orders of the defined types were placed in  this encounter.    Glenetta Hew, M.D., M.S. Interventional Cardiologist   Pager # [phone] Phone # [phone] [address] [place], [place] [address]   Thank you for choosing [facility] at [facility]!!

## 2020-08-21 ENCOUNTER — Other Ambulatory Visit (HOSPITAL_COMMUNITY)
Admission: RE | Admit: 2020-08-21 | Discharge: 2020-08-21 | Disposition: A | Discharge status: Home / Self Care | Payer: Medicare Other | Source: Ambulatory Visit | Attending: Surgery | Admitting: Surgery

## 2020-08-21 DIAGNOSIS — Z20822 Contact with and (suspected) exposure to covid-19: Secondary | ICD-10-CM | POA: Insufficient documentation

## 2020-08-21 DIAGNOSIS — Z01812 Encounter for preprocedural laboratory examination: Secondary | ICD-10-CM | POA: Insufficient documentation

## 2020-08-22 LAB — SARS CORONAVIRUS 2 (TAT 6-24 HRS): SARS Coronavirus 2: NEGATIVE

## 2020-08-24 NOTE — H&P (Signed)
   Crystal [name]  Location: [facility] Patient #: [id number] DOB: 05/11/1946 Widowed / Language: Crystal [name] / Race: White Female   History of Present Illness  The patient is a 74 year old female presenting for a post-operative visit. She is here for her first postoperative visit status post laparoscopic cholecystectomy for cholecystitis. She reports she is doing well. She is eating well and moving her bowels well. She has a history of right breast cancer and is felt a small mass superficially along her scar the 12 position of the right breast. It is been there for more than 6 months. She had a mammogram which was unremarkable. She is concerned about the mass given her history. She had radiation to the breast as well. She also has a history of lymphoma.   Allergies Malachi Bonds, CMA; Codeine/Codeine Derivatives   Medication History Malachi Bonds, CMA; Losartan Potassium-HCTZ (50-12.5MG  Tablet, Oral) Active. Metoprolol Tartrate (50MG  Tablet, Oral) Active. LORazepam (0.5MG  Tablet, Oral) Active. Medications Reconciled  Vitals (Chemira [name] CMA; 07/02/2020 8:50 AM Weight: 220.8 lb Height: 65in Body Surface Area: 2.06 m Body Mass Index: 36.74 kg/m  Temp.: 97.79F  Pulse: 88 (Regular)  BP: 112/70(Sitting, Left Arm, Standard)       Physical Exam  The physical exam findings are as follows: Note: She appears well and exam today  Her abdomen is soft and nontender. Her incisions are well-healed.  On examination of her right breast, there is a hard firm mass very superficial along the scar at the lumpectomy site. There are no skin changes. There is no axillary adenopathy   The final pathology of her gallbladder showed chronic cholecystitis with gallstones    Assessment & Plan   BREAST MASS, RIGHT (N63.10)  Impression: From her gallbladder surgery, she is doing well. I'm concerned about the mass along the scar of her breast. This is  approximately less than 1 cm in size. Given her history of prior breast cancer in the right breast, I would recommend a lumpectomy of this superficial mass for complete dislodgment evaluation for malignancy. I would also recommend this given her history of lymphomas well. She is eager to have the area on the right breast excised. Again, I reviewed her mammograms from April of this year. I discussed proceeding with a right breast lumpectomy. I discussed the risk in detail. She understands and wished to proceed with surgery

## 2020-08-25 ENCOUNTER — Ambulatory Visit (HOSPITAL_BASED_OUTPATIENT_CLINIC_OR_DEPARTMENT_OTHER): Payer: Medicare Other | Admitting: Certified Registered Nurse Anesthetist

## 2020-08-25 ENCOUNTER — Encounter (HOSPITAL_BASED_OUTPATIENT_CLINIC_OR_DEPARTMENT_OTHER)
Admission: RE | Disposition: A | Discharge status: Home / Self Care | Payer: Self-pay | Source: Home / Self Care | Attending: Surgery

## 2020-08-25 ENCOUNTER — Other Ambulatory Visit: Payer: Self-pay

## 2020-08-25 ENCOUNTER — Ambulatory Visit (HOSPITAL_BASED_OUTPATIENT_CLINIC_OR_DEPARTMENT_OTHER)
Admission: RE | Admit: 2020-08-25 | Discharge: 2020-08-25 | Disposition: A | Discharge status: Home / Self Care | Payer: Medicare Other | Attending: Surgery | Admitting: Surgery

## 2020-08-25 ENCOUNTER — Encounter (HOSPITAL_BASED_OUTPATIENT_CLINIC_OR_DEPARTMENT_OTHER): Payer: Self-pay | Admitting: Surgery

## 2020-08-25 DIAGNOSIS — N6489 Other specified disorders of breast: Secondary | ICD-10-CM | POA: Diagnosis not present

## 2020-08-25 DIAGNOSIS — R921 Mammographic calcification found on diagnostic imaging of breast: Secondary | ICD-10-CM | POA: Diagnosis not present

## 2020-08-25 DIAGNOSIS — Z853 Personal history of malignant neoplasm of breast: Secondary | ICD-10-CM | POA: Diagnosis not present

## 2020-08-25 DIAGNOSIS — E785 Hyperlipidemia, unspecified: Secondary | ICD-10-CM | POA: Diagnosis not present

## 2020-08-25 DIAGNOSIS — I428 Other cardiomyopathies: Secondary | ICD-10-CM | POA: Diagnosis not present

## 2020-08-25 DIAGNOSIS — N6031 Fibrosclerosis of right breast: Secondary | ICD-10-CM | POA: Insufficient documentation

## 2020-08-25 DIAGNOSIS — I10 Essential (primary) hypertension: Secondary | ICD-10-CM | POA: Diagnosis not present

## 2020-08-25 DIAGNOSIS — N6310 Unspecified lump in the right breast, unspecified quadrant: Secondary | ICD-10-CM | POA: Insufficient documentation

## 2020-08-25 HISTORY — PX: BREAST LUMPECTOMY: SHX2

## 2020-08-25 SURGERY — BREAST LUMPECTOMY
Anesthesia: General | Site: Breast | Laterality: Right

## 2020-08-25 MED ORDER — PHENYLEPHRINE HCL-NACL 10-0.9 MG/250ML-% IV SOLN
INTRAVENOUS | Status: AC
  Filled 2020-08-25: qty 750

## 2020-08-25 MED ORDER — GABAPENTIN 100 MG PO CAPS
100.0000 mg | ORAL_CAPSULE | ORAL | Status: AC
  Administered 2020-08-25: 100 mg via ORAL

## 2020-08-25 MED ORDER — MIDAZOLAM HCL 2 MG/2ML IJ SOLN
INTRAMUSCULAR | Status: AC
  Filled 2020-08-25: qty 2

## 2020-08-25 MED ORDER — CEFAZOLIN SODIUM-DEXTROSE 2-4 GM/100ML-% IV SOLN
INTRAVENOUS | Status: AC
  Filled 2020-08-25: qty 100

## 2020-08-25 MED ORDER — LIDOCAINE HCL (CARDIAC) PF 100 MG/5ML IV SOSY
PREFILLED_SYRINGE | INTRAVENOUS | Status: DC | PRN
  Administered 2020-08-25: 50 mg via INTRAVENOUS

## 2020-08-25 MED ORDER — PROPOFOL 10 MG/ML IV BOLUS
INTRAVENOUS | Status: AC
  Filled 2020-08-25: qty 20

## 2020-08-25 MED ORDER — ACETAMINOPHEN 500 MG PO TABS
ORAL_TABLET | ORAL | Status: AC
  Filled 2020-08-25: qty 2

## 2020-08-25 MED ORDER — GABAPENTIN 100 MG PO CAPS
ORAL_CAPSULE | ORAL | Status: AC
  Filled 2020-08-25: qty 1

## 2020-08-25 MED ORDER — ONDANSETRON HCL 4 MG/2ML IJ SOLN
INTRAMUSCULAR | Status: DC | PRN
  Administered 2020-08-25: 4 mg via INTRAVENOUS

## 2020-08-25 MED ORDER — DEXAMETHASONE SODIUM PHOSPHATE 10 MG/ML IJ SOLN
INTRAMUSCULAR | Status: AC
  Filled 2020-08-25: qty 1

## 2020-08-25 MED ORDER — ONDANSETRON HCL 4 MG/2ML IJ SOLN
INTRAMUSCULAR | Status: AC
  Filled 2020-08-25: qty 2

## 2020-08-25 MED ORDER — EPHEDRINE SULFATE 50 MG/ML IJ SOLN
INTRAMUSCULAR | Status: DC | PRN
  Administered 2020-08-25 (×2): 10 mg via INTRAVENOUS

## 2020-08-25 MED ORDER — CHLORHEXIDINE GLUCONATE CLOTH 2 % EX PADS: 6.0000 | MEDICATED_PAD | Freq: Once | CUTANEOUS | Status: DC

## 2020-08-25 MED ORDER — MEPERIDINE HCL 25 MG/ML IJ SOLN: 6.2500 mg | INTRAMUSCULAR | Status: DC | PRN

## 2020-08-25 MED ORDER — LACTATED RINGERS IV SOLN: INTRAVENOUS | Status: DC

## 2020-08-25 MED ORDER — DEXAMETHASONE SODIUM PHOSPHATE 4 MG/ML IJ SOLN
INTRAMUSCULAR | Status: DC | PRN
  Administered 2020-08-25: 5 mg via INTRAVENOUS

## 2020-08-25 MED ORDER — PHENYLEPHRINE 40 MCG/ML (10ML) SYRINGE FOR IV PUSH (FOR BLOOD PRESSURE SUPPORT)
PREFILLED_SYRINGE | INTRAVENOUS | Status: AC
  Filled 2020-08-25: qty 10

## 2020-08-25 MED ORDER — MIDAZOLAM HCL 5 MG/5ML IJ SOLN
INTRAMUSCULAR | Status: DC | PRN
  Administered 2020-08-25: .5 mg via INTRAVENOUS

## 2020-08-25 MED ORDER — ACETAMINOPHEN 500 MG PO TABS
1000.0000 mg | ORAL_TABLET | ORAL | Status: AC
  Administered 2020-08-25: 1000 mg via ORAL

## 2020-08-25 MED ORDER — ONDANSETRON HCL 4 MG/2ML IJ SOLN: 4.0000 mg | Freq: Once | INTRAMUSCULAR | Status: DC | PRN

## 2020-08-25 MED ORDER — PHENYLEPHRINE HCL (PRESSORS) 10 MG/ML IV SOLN
INTRAVENOUS | Status: DC | PRN
  Administered 2020-08-25: 120 ug via INTRAVENOUS
  Administered 2020-08-25: 80 ug via INTRAVENOUS
  Administered 2020-08-25 (×2): 120 ug via INTRAVENOUS

## 2020-08-25 MED ORDER — TRAMADOL HCL 50 MG PO TABS
50.0000 mg | ORAL_TABLET | Freq: Four times a day (QID) | ORAL | 0 refills | Status: DC | PRN
Start: 2020-08-25 — End: 2020-09-07

## 2020-08-25 MED ORDER — BUPIVACAINE HCL (PF) 0.5 % IJ SOLN
INTRAMUSCULAR | Status: DC | PRN
  Administered 2020-08-25: 10 mL

## 2020-08-25 MED ORDER — FENTANYL CITRATE (PF) 100 MCG/2ML IJ SOLN
INTRAMUSCULAR | Status: DC | PRN
  Administered 2020-08-25: 50 ug via INTRAVENOUS

## 2020-08-25 MED ORDER — PROPOFOL 10 MG/ML IV BOLUS
INTRAVENOUS | Status: DC | PRN
  Administered 2020-08-25: 120 mg via INTRAVENOUS

## 2020-08-25 MED ORDER — LIDOCAINE 2% (20 MG/ML) 5 ML SYRINGE
INTRAMUSCULAR | Status: AC
  Filled 2020-08-25: qty 5

## 2020-08-25 MED ORDER — HYDROMORPHONE HCL 1 MG/ML IJ SOLN: 0.2500 mg | INTRAMUSCULAR | Status: DC | PRN

## 2020-08-25 MED ORDER — CEFAZOLIN SODIUM-DEXTROSE 2-4 GM/100ML-% IV SOLN
2.0000 g | INTRAVENOUS | Status: AC
  Administered 2020-08-25: 2 g via INTRAVENOUS

## 2020-08-25 MED ORDER — FENTANYL CITRATE (PF) 100 MCG/2ML IJ SOLN
INTRAMUSCULAR | Status: AC
  Filled 2020-08-25: qty 2

## 2020-08-25 SURGICAL SUPPLY — 37 items
ADH SKN CLS APL DERMABOND .7 (GAUZE/BANDAGES/DRESSINGS) ×1
APL PRP STRL LF DISP 70% ISPRP (MISCELLANEOUS) ×1
BLADE SURG 15 STRL LF DISP TIS (BLADE) ×1 IMPLANT
BLADE SURG 15 STRL SS (BLADE) ×2
CANISTER SUCT 1200ML W/VALVE (MISCELLANEOUS) IMPLANT
CHLORAPREP W/TINT 26 (MISCELLANEOUS) ×2 IMPLANT
COVER BACK TABLE 60X90IN (DRAPES) ×2 IMPLANT
COVER MAYO STAND STRL (DRAPES) ×2 IMPLANT
DERMABOND ADVANCED (GAUZE/BANDAGES/DRESSINGS) ×1
DERMABOND ADVANCED .7 DNX12 (GAUZE/BANDAGES/DRESSINGS) ×1 IMPLANT
DRAPE LAPAROTOMY 100X72 PEDS (DRAPES) ×2 IMPLANT
DRAPE UTILITY XL STRL (DRAPES) ×2 IMPLANT
ELECT REM PT RETURN 9FT ADLT (ELECTROSURGICAL) ×2
ELECTRODE REM PT RTRN 9FT ADLT (ELECTROSURGICAL) ×1 IMPLANT
GAUZE SPONGE 4X4 12PLY STRL LF (GAUZE/BANDAGES/DRESSINGS) ×2 IMPLANT
GLOVE BIO SURGEON STRL SZ 6.5 (GLOVE) ×1 IMPLANT
GLOVE BIOGEL PI IND STRL 6.5 (GLOVE) IMPLANT
GLOVE BIOGEL PI INDICATOR 6.5 (GLOVE) ×1
GLOVE SURG SIGNA 7.5 PF LTX (GLOVE) ×2 IMPLANT
GOWN STRL REUS W/ TWL LRG LVL3 (GOWN DISPOSABLE) ×1 IMPLANT
GOWN STRL REUS W/ TWL XL LVL3 (GOWN DISPOSABLE) ×1 IMPLANT
GOWN STRL REUS W/TWL LRG LVL3 (GOWN DISPOSABLE) ×2
GOWN STRL REUS W/TWL XL LVL3 (GOWN DISPOSABLE) ×2
NDL HYPO 25X1 1.5 SAFETY (NEEDLE) ×1 IMPLANT
NEEDLE HYPO 25X1 1.5 SAFETY (NEEDLE) ×2 IMPLANT
NS IRRIG 1000ML POUR BTL (IV SOLUTION) ×2 IMPLANT
PACK BASIN DAY SURGERY FS (CUSTOM PROCEDURE TRAY) ×2 IMPLANT
PENCIL SMOKE EVACUATOR (MISCELLANEOUS) ×2 IMPLANT
SPONGE LAP 4X18 RFD (DISPOSABLE) ×2 IMPLANT
SUT MNCRL AB 4-0 PS2 18 (SUTURE) ×2 IMPLANT
SUT SILK 2 0 SH (SUTURE) ×2 IMPLANT
SUT VIC AB 3-0 SH 27 (SUTURE) ×2
SUT VIC AB 3-0 SH 27X BRD (SUTURE) ×1 IMPLANT
SYR CONTROL 10ML LL (SYRINGE) ×2 IMPLANT
TOWEL GREEN STERILE FF (TOWEL DISPOSABLE) ×2 IMPLANT
TUBE CONNECTING 20X1/4 (TUBING) IMPLANT
YANKAUER SUCT BULB TIP NO VENT (SUCTIONS) IMPLANT

## 2020-08-25 NOTE — Transfer of Care (Signed)
Immediate Anesthesia Transfer of Care Note  Patient: Crystal [name]  Procedure(s) Performed: RIGHT BREAST LUMPECTOMY (Right Breast)  Patient Location: PACU  Anesthesia Type:General  Level of Consciousness: awake, alert  and oriented  Airway & Oxygen Therapy: Patient Spontanous Breathing and Patient connected to nasal cannula oxygen  Post-op Assessment: Report given to RN and Post -op Vital signs reviewed and stable  Post vital signs: Reviewed and stable  Last Vitals:  Vitals Value Taken Time  BP 95/68 08/25/20 1101  Temp    Pulse 99 08/25/20 1102  Resp 17 08/25/20 1102  SpO2 92 % 08/25/20 1102  Vitals shown include unvalidated device data.  Last Pain:  Vitals:   08/25/20 0840  PainSc: 0-No pain         Complications: No complications documented.

## 2020-08-25 NOTE — Op Note (Signed)
RIGHT BREAST LUMPECTOMY  Procedure Note  Crystal [name] 08/25/2020   Pre-op Diagnosis: RIGHT BREAST MASS     Post-op Diagnosis: same  Procedure(s): RIGHT BREAST LUMPECTOMY  Surgeon(s): Coralie Keens, MD  Anesthesia: General  Staff:  Circulator: Izora Ribas, RN Scrub Person: Carson Myrtle, RN  Estimated Blood Loss: Minimal               Specimens: sent to path  Indications: This is a 74 year old female with a previous history of right breast cancer status post lumpectomy around 20 years ago.  She has a tender, palpable small nodule just superior to the old incision of the right breast.  The cause of her discomfort and previous history decision was made to remove this area  Findings: The mass appeared consistent with a possible previously placed surgical clip and fat necrosis  Procedure: The patient was brought to the operating room and identifies correct patient.  She is placed upon the operating table and anesthesia was induced.  Her right breast were prepped and draped in usual sterile fashion.  I anesthetized skin around the palpable mass in the old scar with Marcaine and then made elliptical incision with a scalpel.  I then dissected down to the breast tissue with electrocautery.  The surrounding tissue around the palpable mass was very calcified and it became apparent that this appeared to be an old surgical clip that was coming up through the depth of the incision and poking into the skin.  I excised this with the cautery.  The tissue was then sent to pathology for evaluation.  I achieved hemostasis with cautery.  I anesthetized incision further with Marcaine.  I then closed the subcutaneous tissue with interrupted 3-0 Vicryl sutures and closed the skin with a running 4-0 Monocryl.  Dermabond was then applied.  Patient tolerated the procedure well.  All the counts were correct at the end of the procedure.  The patient was then extubated in the operating room and taken in  stable addition to the recovery room.          Coralie Keens   Date: 08/25/2020  Time: 10:59 AM

## 2020-08-25 NOTE — Discharge Instructions (Signed)
Ok to shower starting tomorrow  Ice pack, tylenol, and ibuprofen also for pain  No vigorous activity for one week     Post Anesthesia Home Care Instructions  Activity: Get plenty of rest for the remainder of the day. A responsible individual must stay with you for 24 hours following the procedure.  For the next 24 hours, DO NOT: -Drive a car -Paediatric nurse -Drink alcoholic beverages -Take any medication unless instructed by your physician -Make any legal decisions or sign important papers.  Meals: Start with liquid foods such as gelatin or soup. Progress to regular foods as tolerated. Avoid greasy, spicy, heavy foods. If nausea and/or vomiting occur, drink only clear liquids until the nausea and/or vomiting subsides. Call your physician if vomiting continues.  Special Instructions/Symptoms: Your throat may feel dry or sore from the anesthesia or the breathing tube placed in your throat during surgery. If this causes discomfort, gargle with warm salt water. The discomfort should disappear within 24 hours.  If you had a scopolamine patch placed behind your ear for the management of post- operative nausea and/or vomiting:  1. The medication in the patch is effective for 72 hours, after which it should be removed.  Wrap patch in a tissue and discard in the trash. Wash hands thoroughly with soap and water. 2. You may remove the patch earlier than 72 hours if you experience unpleasant side effects which may include dry mouth, dizziness or visual disturbances. 3. Avoid touching the patch. Wash your hands with soap and water after contact with the patch.      Call your surgeon if you experience:   1.  Fever over 101.0. 2.  Inability to urinate. 3.  Nausea and/or vomiting. 4.  Extreme swelling or bruising at the surgical site. 5.  Continued bleeding from the incision. 6.  Increased pain, redness or drainage from the incision. 7.  Problems related to your pain medication. 8.  Any  problems and/or concerns

## 2020-08-25 NOTE — Anesthesia Preprocedure Evaluation (Signed)
Anesthesia Evaluation  Patient identified by MRN, date of birth, ID band Patient awake    Reviewed: Allergy & Precautions, NPO status , Patient's Chart, lab work & pertinent test results  Airway Mallampati: I  TM Distance: >3 FB Neck ROM: Full    Dental   Pulmonary    Pulmonary exam normal        Cardiovascular hypertension, Pt. on medications Normal cardiovascular exam     Neuro/Psych Anxiety    GI/Hepatic   Endo/Other    Renal/GU      Musculoskeletal   Abdominal   Peds  Hematology   Anesthesia Other Findings   Reproductive/Obstetrics                             Anesthesia Physical Anesthesia Plan  ASA: II  Anesthesia Plan: General   Post-op Pain Management:    Induction: Intravenous  PONV Risk Score and Plan: 3 and Midazolam, Ondansetron and Treatment may vary due to age or medical condition  Airway Management Planned: LMA  Additional Equipment:   Intra-op Plan:   Post-operative Plan: Extubation in OR  Informed Consent: I have reviewed the patients History and Physical, chart, labs and discussed the procedure including the risks, benefits and alternatives for the proposed anesthesia with the patient or authorized representative who has indicated his/her understanding and acceptance.       Plan Discussed with: CRNA and Surgeon  Anesthesia Plan Comments:         Anesthesia Quick Evaluation

## 2020-08-25 NOTE — Interval H&P Note (Signed)
History and Physical Interval Note:no change in H and P  08/25/2020 9:23 AM  Crystal [name]  has presented today for surgery, with the diagnosis of RIGHT BREAST MASS.  The various methods of treatment have been discussed with the patient and family. After consideration of risks, benefits and other options for treatment, the patient has consented to  Procedure(s): RIGHT BREAST LUMPECTOMY (Right) as a surgical intervention.  The patient's history has been reviewed, patient examined, no change in status, stable for surgery.  I have reviewed the patient's chart and labs.  Questions were answered to the patient's satisfaction.     Coralie Keens

## 2020-08-25 NOTE — Anesthesia Postprocedure Evaluation (Signed)
Anesthesia Post Note  Patient: Crystal [name]  Procedure(s) Performed: RIGHT BREAST LUMPECTOMY (Right Breast)     Patient location during evaluation: PACU Anesthesia Type: General Level of consciousness: awake and alert Pain management: pain level controlled Vital Signs Assessment: post-procedure vital signs reviewed and stable Respiratory status: spontaneous breathing, nonlabored ventilation, respiratory function stable and patient connected to nasal cannula oxygen Cardiovascular status: blood pressure returned to baseline and stable Postop Assessment: no apparent nausea or vomiting Anesthetic complications: no   No complications documented.  Last Vitals:  Vitals:   08/25/20 1140 08/25/20 1150  BP:  115/72  Pulse: 80 87  Resp: (!) 22 18  Temp:  36.7 C  SpO2: 97% 95%    Last Pain:  Vitals:   08/25/20 1150  PainSc: 0-No pain                 Narcissa Melder DAVID

## 2020-08-25 NOTE — Anesthesia Procedure Notes (Signed)
Procedure Name: LMA Insertion Date/Time: 08/25/2020 10:29 AM Performed by: Bufford Spikes, CRNA Pre-anesthesia Checklist: Patient identified, Emergency Drugs available, Suction available and Patient being monitored Patient Re-evaluated:Patient Re-evaluated prior to induction Oxygen Delivery Method: Circle system utilized Preoxygenation: Pre-oxygenation with 100% oxygen Induction Type: IV induction Ventilation: Mask ventilation without difficulty LMA: LMA inserted LMA Size: 4.0 Number of attempts: 1 Airway Equipment and Method: Bite block Placement Confirmation: positive ETCO2 Tube secured with: Tape Dental Injury: Teeth and Oropharynx as per pre-operative assessment

## 2020-08-26 ENCOUNTER — Encounter (HOSPITAL_BASED_OUTPATIENT_CLINIC_OR_DEPARTMENT_OTHER): Payer: Self-pay | Admitting: Surgery

## 2020-08-26 LAB — SURGICAL PATHOLOGY

## 2020-08-29 ENCOUNTER — Encounter: Payer: Self-pay | Admitting: Cardiology

## 2020-08-29 DIAGNOSIS — Z01810 Encounter for preprocedural cardiovascular examination: Secondary | ICD-10-CM | POA: Insufficient documentation

## 2020-08-29 DIAGNOSIS — Z452 Encounter for adjustment and management of vascular access device: Secondary | ICD-10-CM | POA: Insufficient documentation

## 2020-08-29 NOTE — Assessment & Plan Note (Signed)
Most recent echo back to 50 to 55%.  No regional wall motion abnormalities.  No active heart failure symptoms.  She is on stable dose of beta-blocker and combo losartan-HCTZ.  Relatively euvolemic.  Perioperatively, would probably hold the losartan-HCT dose the morning of surgery but would continue beta-blocker.

## 2020-08-29 NOTE — Assessment & Plan Note (Signed)
Last labs were from last December.  LDL is 110.  She is not on any medications.  Relatively acceptable based on her existing level of risk.  With her upcoming breast cancer treatments, would hold off on considering initiation of statin.

## 2020-08-29 NOTE — Assessment & Plan Note (Signed)
Patient was pending breast surgery.  No active cardiac symptoms.-Either angina or heart failure.  She is nondiabetic with normal renal function.  Breast surgery is low risk with surgery.  She would be considered relatively low risk for low risk surgery.  No further cardiac evaluation required.  Okay to hold aspirin 5 days preop.  Continue beta-blocker but hold losartan-HCTZ the morning of surgery.

## 2020-08-29 NOTE — Assessment & Plan Note (Addendum)
Well-controlled on current meds.  No change  With the potential for her being on medications associated with nausea to treat breast cancer, I did recommend that if she were to have poor p.o. intake both food and water, would be susceptible to hold the losartan-HCTZ intermittently.

## 2020-08-29 NOTE — Assessment & Plan Note (Signed)
The patient understands the need to lose weight with diet and exercise. We have discussed specific strategies for this.  

## 2020-08-29 NOTE — Assessment & Plan Note (Signed)
Pretty much stable.  No major changes.  Likely related to combination of deconditioning and obesity.  EF is now is back to normal with maybe some mild diastolic dysfunction.  Recommend continued exercise and weight loss.

## 2020-09-03 ENCOUNTER — Ambulatory Visit: Payer: Medicare Other | Admitting: Oncology

## 2020-09-07 ENCOUNTER — Inpatient Hospital Stay: Payer: Medicare Other | Attending: Oncology | Admitting: Oncology

## 2020-09-07 ENCOUNTER — Other Ambulatory Visit: Payer: Self-pay | Admitting: *Deleted

## 2020-09-07 ENCOUNTER — Other Ambulatory Visit: Payer: Self-pay | Admitting: Oncology

## 2020-09-07 ENCOUNTER — Other Ambulatory Visit: Payer: Self-pay

## 2020-09-07 VITALS — BP 98/68 | HR 96 | Temp 97.2°F | Resp 16 | Ht 65.0 in | Wt 221.5 lb

## 2020-09-07 DIAGNOSIS — I10 Essential (primary) hypertension: Secondary | ICD-10-CM | POA: Diagnosis not present

## 2020-09-07 DIAGNOSIS — C8599 Non-Hodgkin lymphoma, unspecified, extranodal and solid organ sites: Secondary | ICD-10-CM

## 2020-09-07 DIAGNOSIS — Z853 Personal history of malignant neoplasm of breast: Secondary | ICD-10-CM | POA: Insufficient documentation

## 2020-09-07 DIAGNOSIS — C8298 Follicular lymphoma, unspecified, lymph nodes of multiple sites: Secondary | ICD-10-CM | POA: Insufficient documentation

## 2020-09-07 MED ORDER — LORAZEPAM 0.5 MG PO TABS: 0.5000 mg | ORAL_TABLET | Freq: Every day | ORAL | 1 refills | Status: DC | PRN

## 2020-09-07 MED FILL — LORazepam 0.5 MG TABS: 0.5 | 30 days supply | Qty: 30 | Fill #0

## 2020-09-07 NOTE — Progress Notes (Signed)
[place] OFFICE PROGRESS NOTE   Diagnosis: Lymphoma, breast cancer  INTERVAL HISTORY:   [name] returns as scheduled.  No palpable lymph nodes.  She underwent removal of a surgical clip/fat necrosis at the right breast on 08/25/2020.  The pathology returned as benign breast tissue with fibrosis.  She underwent a cholecystectomy on 06/09/2020.  She reports irregular bowel habits following this procedure.  Objective:  Vital signs in last 24 hours:  Blood pressure 98/68, pulse 96, temperature (!) 97.2 F (36.2 C), temperature source Tympanic, resp. rate 16, height 5\' 5"  (1.651 m), weight 221 lb 8 oz (100.5 kg), SpO2 95 %.    HEENT: Neck without mass Lymphatics: No cervical, supraclavicular, axillary, or inguinal nodes Resp: End inspiratory coarse rhonchi at the posterior base bilaterally, no respiratory distress Cardio: Regular rate and rhythm GI: No mass, nontender, no hepatosplenomegaly Vascular: No leg edema  Skin: Healing incision at the right upper breast    Lab Results:  Lab Results  Component Value Date   WBC 4.3 06/01/2020   HGB 13.5 06/01/2020   HCT 41.8 06/01/2020   MCV 93.3 06/01/2020   PLT 214 06/01/2020   NEUTROABS 1.9 06/30/2014    CMP  Lab Results  Component Value Date   NA 142 08/19/2020   K 3.7 08/19/2020   CL 104 08/19/2020   CO2 28 08/19/2020   GLUCOSE 98 08/19/2020   BUN 17 08/19/2020   CREATININE 1.23 (H) 08/19/2020   CALCIUM 8.9 08/19/2020   PROT 7.6 06/30/2018   ALBUMIN 3.6 06/30/2018   AST 24 06/30/2018   ALT 17 06/30/2018   ALKPHOS 79 06/30/2018   BILITOT 0.6 06/30/2018   GFRNONAA 46 (L) 08/19/2020   GFRAA 48 (L) 06/01/2020     Medications: I have reviewed the patient's current medications.   Assessment/Plan: 1. Low-grade follicular lymphoma involving a right parotid mass, status post an excisional biopsy on 11/28/2010. 1. Staging CT scans 01/03/2011 confirmed an increased number of small nodes in the neck,  left axilla and pelvis without clear evidence of pathologic lymphadenopathy. 2. PET scan 01/11/2011 confirmed hypermetabolic lymph nodes in the right cervical chain, left axillary nodes, periaortic, common iliac, external iliac and inguinal nodes. There was also a possible area of involvement at the right tonsillar region. 3. Palpable left posterior cervical nodes confirmed on exam 05/15/2013- progressive left neck nodes on exam 08/18/2013. 4. Status post cycle 1 bendamustine/Rituxan beginning 08/28/2013. 5. Near-complete resolution of left neck adenopathy on exam 09/12/2013. 6. Status post cycle 2 bendamustine/Rituxan beginning 09/25/2013. 7. CT abdomen/pelvis 10/01/2013-near-complete response to therapy with isolated borderline enlarged left iliac node measuring 1.37 m. Previously identified right peritoneal right pelvic sidewall adenopathy is resolved. 8. Cycle 3 bendamustine/Rituxan beginning 10/28/2013. 9. Cycle 4 bendamustine/Rituxan beginning 11/25/2013. 10. Cycle 5 of bendamustine/Rituxan beginning 12/24/2013. 11. Cycle 6 bendamustine/rituximab 01/27/2014. 2. Stage I right-sided breast cancer diagnosed in 1998. 3. History of congestive heart failure. 4. Hypertension. 5. Port-A-Cath placement 08/25/2013 in interventional radiology. Removed 04/22/2014. 6. Chills during the Rituxan infusion 08/28/2013. She was given Solu-Medrol. Rituxan was resumed and completed. 7. Abdominal pain following cycle 2 bendamustine/Rituxan-no explanation for the pain on a CT 10/01/2013, resolved after starting Protonix. 8. Tachycardia 12/23/2013. Chest CT showed a pulmonary embolus. She completed 3 months of anticoagulation. 9. Chest CT 12/23/2013. Small nonocclusive right lower lobe pulmonary embolus. Minimal thrombus burden. No other emboli demonstrated. Xarelto initiated. Right upper extremity and bilateral lower extremity Dopplers negative on 12/25/2013.    Disposition: Crystal [name]  is in remission from  lymphoma and breast cancer.  She will continue yearly mammography.  She will return for an office visit in 1 year.  She declines the COVID-19 vaccine.  [name], MD  09/07/2020  12:16 PM

## 2020-09-08 ENCOUNTER — Telehealth: Payer: Self-pay | Admitting: Oncology

## 2020-09-08 NOTE — Telephone Encounter (Signed)
Scheduled appointment per 11/16 los. Called patient, no answer. Left message for patient with appointment date and time.

## 2020-09-14 DIAGNOSIS — R8271 Bacteriuria: Secondary | ICD-10-CM | POA: Diagnosis not present

## 2020-09-14 DIAGNOSIS — R300 Dysuria: Secondary | ICD-10-CM | POA: Diagnosis not present

## 2020-10-14 MED FILL — LORazepam 0.5 MG TABS: 0.5 | 30 days supply | Qty: 30 | Fill #0

## 2020-11-18 DIAGNOSIS — F39 Unspecified mood [affective] disorder: Secondary | ICD-10-CM | POA: Diagnosis not present

## 2020-11-18 DIAGNOSIS — F411 Generalized anxiety disorder: Secondary | ICD-10-CM | POA: Diagnosis not present

## 2020-11-18 DIAGNOSIS — G4700 Insomnia, unspecified: Secondary | ICD-10-CM | POA: Diagnosis not present

## 2020-11-18 DIAGNOSIS — I129 Hypertensive chronic kidney disease with stage 1 through stage 4 chronic kidney disease, or unspecified chronic kidney disease: Secondary | ICD-10-CM | POA: Diagnosis not present

## 2020-11-18 DIAGNOSIS — K589 Irritable bowel syndrome without diarrhea: Secondary | ICD-10-CM | POA: Diagnosis not present

## 2020-11-18 DIAGNOSIS — Z8572 Personal history of non-Hodgkin lymphomas: Secondary | ICD-10-CM | POA: Diagnosis not present

## 2020-11-18 DIAGNOSIS — I700 Atherosclerosis of aorta: Secondary | ICD-10-CM | POA: Diagnosis not present

## 2020-11-18 DIAGNOSIS — Z0000 Encounter for general adult medical examination without abnormal findings: Secondary | ICD-10-CM | POA: Diagnosis not present

## 2020-11-18 DIAGNOSIS — R7301 Impaired fasting glucose: Secondary | ICD-10-CM | POA: Diagnosis not present

## 2020-11-18 DIAGNOSIS — N183 Chronic kidney disease, stage 3 unspecified: Secondary | ICD-10-CM | POA: Diagnosis not present

## 2020-11-18 DIAGNOSIS — E7800 Pure hypercholesterolemia, unspecified: Secondary | ICD-10-CM | POA: Diagnosis not present

## 2020-11-18 DIAGNOSIS — Z1389 Encounter for screening for other disorder: Secondary | ICD-10-CM | POA: Diagnosis not present

## 2021-01-05 ENCOUNTER — Encounter: Payer: Self-pay | Admitting: Cardiology

## 2021-01-05 NOTE — Telephone Encounter (Signed)
error 

## 2021-01-25 ENCOUNTER — Other Ambulatory Visit (HOSPITAL_COMMUNITY): Payer: Self-pay

## 2021-01-25 MED FILL — Lorazepam Tab 0.5 MG: ORAL | 30 days supply | Qty: 30 | Fill #0 | Status: AC

## 2021-07-06 ENCOUNTER — Inpatient Hospital Stay: Payer: Medicare Other | Admitting: Oncology

## 2021-08-07 DIAGNOSIS — J069 Acute upper respiratory infection, unspecified: Secondary | ICD-10-CM | POA: Diagnosis not present

## 2021-08-07 DIAGNOSIS — J208 Acute bronchitis due to other specified organisms: Secondary | ICD-10-CM | POA: Diagnosis not present

## 2021-08-29 ENCOUNTER — Ambulatory Visit (INDEPENDENT_AMBULATORY_CARE_PROVIDER_SITE_OTHER): Payer: Medicare Other | Admitting: Cardiology

## 2021-08-29 ENCOUNTER — Ambulatory Visit: Payer: Medicare Other | Admitting: Cardiology

## 2021-08-29 ENCOUNTER — Other Ambulatory Visit: Payer: Self-pay

## 2021-08-29 VITALS — BP 115/75 | HR 100 | Ht 65.0 in | Wt 214.2 lb

## 2021-08-29 DIAGNOSIS — R0609 Other forms of dyspnea: Secondary | ICD-10-CM

## 2021-08-29 DIAGNOSIS — R000 Tachycardia, unspecified: Secondary | ICD-10-CM | POA: Insufficient documentation

## 2021-08-29 DIAGNOSIS — E668 Other obesity: Secondary | ICD-10-CM | POA: Diagnosis not present

## 2021-08-29 DIAGNOSIS — E785 Hyperlipidemia, unspecified: Secondary | ICD-10-CM | POA: Diagnosis not present

## 2021-08-29 DIAGNOSIS — I10 Essential (primary) hypertension: Secondary | ICD-10-CM | POA: Diagnosis not present

## 2021-08-29 DIAGNOSIS — I428 Other cardiomyopathies: Secondary | ICD-10-CM

## 2021-08-29 DIAGNOSIS — R7301 Impaired fasting glucose: Secondary | ICD-10-CM

## 2021-08-29 NOTE — Patient Instructions (Signed)
Medication Instructions:  NO CHANGES *If you need a refill on your cardiac medications before your next appointment, please call your pharmacy*   Lab Work:  [place] If you have labs (blood work) drawn today and your tests are completely normal, you will receive your results only by: [place] (if you have MyChart) OR A paper copy in the mail If you have any lab test that is abnormal or we need to change your treatment, we will call you to review the results.   Testing/Procedures: NOT NEEDED   Follow-Up: At [facility], you and your health needs are our priority.  As part of our continuing mission to provide you with exceptional heart care, we have created designated Provider Care Teams.  These Care Teams include your primary Cardiologist (physician) and Advanced Practice Providers (APPs -  Physician Assistants and Nurse Practitioners) who all work together to provide you with the care you need, when you need it.  We recommend signing up for the patient portal called "MyChart".  Sign up information is provided on this After Visit Summary.  MyChart is used to connect with patients for Virtual Visits (Telemedicine).  Patients are able to view lab/test results, encounter notes, upcoming appointments, etc.  Non-urgent messages can be sent to your provider as well.   To learn more about what you can do with MyChart, go to [email or web address].    Your next appointment:   12 month(s)  The format for your next appointment:   In Person  Provider:   Glenetta Hew, MD

## 2021-08-29 NOTE — Progress Notes (Signed)
Primary Care Provider: Gaynelle Arabian, MD Cardiologist: Glenetta Hew, MD Electrophysiologist: None  Clinic Note: Chief Complaint  Patient presents with   Follow-up    Annual.  Doing well.     ===================================  ASSESSMENT/PLAN   Problem List Items Addressed This Visit       Cardiology Problems   Nonischemic cardiomyopathy ([place]) (Chronic)    Pretty much fully resolved with most recent echo showing EF of 50 to 55% and no R WMA.  No real active heart failure symptoms.  I think her exertional dyspnea is because of deconditioning and obesity.  She is on stable dose of beta-blocker, but no longer on the ARB-HCTZ, combination with well-controlled blood pressure.  Euvolemic on exam..      Relevant Orders   Hemoglobin A1c (Completed)   Essential hypertension - Primary (Chronic)    Blood pressure looks good.  She held her losartan and HCTZ perioperatively and has not been restarted.  I do think we have room to increase beta-blocker to 75 mg twice daily for tachycardia purposes.      Relevant Orders   EKG 12-Lead (Completed)   Lipid panel (Completed)   Comprehensive metabolic panel (Completed)   Hemoglobin A1c (Completed)   Dyslipidemia, goal LDL below 100 (Chronic)    Labs in January showed LDL 108.  Labs drawn today show LDL 122.  Not on statin.  Defer to PCP, however I suspect that she may require some type of therapy.      Relevant Orders   Lipid panel (Completed)   Comprehensive metabolic panel (Completed)   Hemoglobin A1c (Completed)     Other   Moderate obesity (Chronic)    Slowly losing weight, doing better.  Needs to continue to slowly drop weight by increasing exercise level and watching diet.  Hopes this will help her lipids as well.      Relevant Orders   EKG 12-Lead (Completed)   Lipid panel (Completed)   Comprehensive metabolic panel (Completed)   Hemoglobin A1c (Completed)   Dyspnea on exertion (Chronic)    Stable, multifactorial.   She has picked up her exercise level.  Hopefully this will help with weight loss and increasing conditioning.      Tachycardia   Relevant Orders   EKG 12-Lead (Completed)   Lipid panel (Completed)   Comprehensive metabolic panel (Completed)   Hemoglobin A1c (Completed)   Other Visit Diagnoses     Impaired fasting glucose       Relevant Orders   Hemoglobin A1c (Completed)       ===================================  HPI:    Crystal [name] is a 75 y.o. female with a PMH Notable for h/o Hodgkin's Lymphoma complicated by NONISCHEMIC CARDIOMYOPATHY (now resolved) who presents today for annual f/u.  Crystal [name] was last seen on 08/19/2020 as an early annual preop visit for lumpectomy and cholecystectomy.  She was pretty stable overall with mild exertional dyspnea-no change from baseline.  No chest pain or pressure..  Recent Hospitalizations:  Right Breast Lumpectomy 08/25/2020  Reviewed  CV studies:    The following studies were reviewed today: (if available, images/films reviewed: From Epic Chart or Care Everywhere) none:  Interval History:   Crystal [name] returns for annual follow-up doing relatively well.  She still says her bowels are, of getting used to having had her cholecystectomy.  She still lots of loose stools as slowly losing some weight.  She is also walking as [place] possible [place] the mall.  She  does not really like walking on it is over hot so she did not do much walking in summer.  She has stable exertional dyspnea without chest pain or pressure.  No PND, orthopnea or edema.  Does not really notice any irregular heartbeats or palpitations but has noted her heart rate going up a little unusual. Over the last few months she has had recurrent bouts of bronchitis but tested negative for bronchitis.  She started to feel little better with it now, but is still not quite back to her baseline.  Cardiovascular ROS: positive for - dyspnea on exertion, edema, and rapid  heart rate negative for - chest pain, irregular heartbeat, orthopnea, palpitations, paroxysmal nocturnal dyspnea, shortness of breath, or lightheadedness or dizziness, syncope/near syncope or TIA/amaurosis fugax, claudication    REVIEWED OF SYSTEMS   Review of Systems  Constitutional:  Positive for weight loss. Negative for malaise/fatigue.  HENT:  Negative for congestion and nosebleeds.   Respiratory:  Positive for cough and shortness of breath. Negative for sputum production.        Recurrent bouts of bronchitis.  Cardiovascular:  Positive for leg swelling (Stable).  Gastrointestinal:  Negative for blood in stool and melena.  Genitourinary:  Negative for hematuria.  Musculoskeletal:  Positive for back pain and joint pain. Negative for myalgias.  Neurological:  Negative for dizziness, weakness and headaches.  Psychiatric/Behavioral:  Negative for memory loss. The patient is not nervous/anxious and does not have insomnia.    I have reviewed and (if needed) personally updated the patient's problem list, medications, allergies, past medical and surgical history, social and family history.   PAST MEDICAL HISTORY   Past Medical History:  Diagnosis Date   Anxiety    Breast cancer ([place]) 1998   (Rt) lumpectomy dx 1999; Dr. Benay Spice   Dyslipidemia, goal LDL below 130    Essential hypertension    Lymphoma ([place])    lymphoma dx 11/28/10 - left neck   non hodgkins lymphoma 12/2010   Nonischemic cardiomyopathy ([place]) 2008   ? Doxorubincin induced; essentially resolved as of echo in January 2014, current EF 50-55%. Grade 1 diastolic dysfunction.   Obesity    Severe obesity (BMI >= 40) ([place]) 05/21/2013   Improve to BMI of 39 by July 2015    PAST SURGICAL HISTORY   Past Surgical History:  Procedure Laterality Date   ABDOMINAL HYSTERECTOMY     ANKLE FRACTURE SURGERY Left    BREAST EXCISIONAL BIOPSY Left    BREAST EXCISIONAL BIOPSY Right    BREAST LUMPECTOMY Right 1998   BREAST LUMPECTOMY  Right 08/25/2020   Procedure: RIGHT BREAST LUMPECTOMY;  Surgeon: Coralie Keens, MD;  Location: [facility];  Service: General;  Laterality: Right;   CHOLECYSTECTOMY N/A 06/09/2020   Procedure: LAPAROSCOPIC CHOLECYSTECTOMY;  Surgeon: Coralie Keens, MD;  Location: [facility];  Service: General;  Laterality: N/A;   COLONOSCOPY     LEFT HEART CATH AND CORONARY ANGIOGRAPHY  March 2009    None coronary disease, EF 45% (up from nuclear study EF of 30% in June '08)   NM MYOVIEW LTD  03/29/2007   EF 33%,NEGATIVE ISCHEMIA, prob need cath   NM MYOVIEW LTD  05/01/2014   Lexiscan: EF 55%. Normal wall motion; no ischemia or infarction; apical thinning   PORTACATH PLACEMENT  August 25, 2013   rt. with tip in cavoatrial junction, Dr.Yamagata    TRANSTHORACIC ECHOCARDIOGRAM  05/01/2014   Normal LV size with low normal function. EF of 50-55% and Gr  1 DD; aortic sclerosis without stenosis. MAC and thickening/calcification of the anterior leaflet - no notable AI / AS or MR/MS    Immunization History  Administered Date(s) Administered   Influenza Split 07/10/2011, 07/13/2018   Influenza,inj,Quad PF,6+ Mos 06/30/2014, 07/17/2016   Influenza-Unspecified 07/24/2019   Pneumococcal Conjugate-13 09/08/2011   Pneumococcal Polysaccharide-23 05/11/2015    MEDICATIONS/ALLERGIES   Current Meds  Medication Sig   acetaminophen (TYLENOL) 500 MG tablet Take 500 mg by mouth every 6 (six) hours as needed for moderate pain.   aspirin EC 81 MG tablet Take 81 mg by mouth every morning.   metoprolol tartrate (LOPRESSOR) 50 MG tablet Take 50 mg by mouth 2 (two) times daily.   promethazine-dextromethorphan (PROMETHAZINE-DM) 6.25-15 MG/5ML syrup Take 5 mLs by mouth every 6 (six) hours as needed.   [DISCONTINUED] aspirin 81 MG chewable tablet 1 tablet   [DISCONTINUED] docusate sodium (COLACE) 100 MG capsule Take 100 mg by mouth daily.   [DISCONTINUED] losartan-hydrochlorothiazide (HYZAAR) 50-12.5 MG per  tablet Take 1 tablet by mouth every morning.     Allergies  Allergen Reactions   Codeine Other (See Comments)    Bad Headaches   Coreg Other (See Comments)    "Made my legs hurt"   Lisinopril Cough    SOCIAL HISTORY/FAMILY HISTORY   Reviewed in Epic:  Pertinent findings:  Social History   Tobacco Use   Smoking status: Never   Smokeless tobacco: Never  Vaping Use   Vaping Use: Never used  Substance Use Topics   Alcohol use: Yes    Comment: social    Drug use: No   Social History   Social History Narrative   She is a widow mother of 2, with 1 child is deceased. She has 2 grandchildren. She is trying to get some walking in town and can do about 20-25 minutes his another 30 minutes she pushes a more night gets short of breath.   She is at retired [employer], he simply laid off after 45 years.   She usually comes accompanied by her sister, who is also my patient.   She never was a smoker, and only occasionally has a social alcoholic beverage.    OBJCTIVE -PE, EKG, labs   Wt Readings from Last 3 Encounters:  08/29/21 214 lb 3.2 oz (97.2 kg)  09/07/20 221 lb 8 oz (100.5 kg)  08/25/20 219 lb 12.8 oz (99.7 kg)    Physical Exam: BP 115/75   Pulse 100   Ht _0  (1.651 m)   Wt 214 lb 3.2 oz (97.2 kg)   SpO2 95%   BMI 35.64 kg/m  Physical Exam Vitals reviewed.  Constitutional:      General: She is not in acute distress.    Appearance: Normal appearance. She is obese. She is not ill-appearing or toxic-appearing.  HENT:     Head: Normocephalic and atraumatic.  Neck:     Vascular: No carotid bruit or JVD.  Cardiovascular:     Rate and Rhythm: Regular rhythm. Tachycardia present. No extrasystoles are present.    Chest Wall: PMI is not displaced.     Pulses: Decreased pulses (Palpable pulses).     Heart sounds: S1 normal and S2 normal. Heart sounds are distant. No murmur heard.   No friction rub. No gallop.  Pulmonary:     Effort: Pulmonary effort is  normal. No respiratory distress.     Breath sounds: Normal breath sounds.  Chest:     Chest wall: No  tenderness.  Abdominal:     General: Abdomen is flat. Bowel sounds are normal.     Palpations: Abdomen is soft. There is no mass (No HSM or bruit).  Musculoskeletal:        General: Swelling (Mild puffy swelling.) present. Normal range of motion.     Cervical back: Normal range of motion and neck supple.  Skin:    General: Skin is warm and dry.     Comments: Bilateral lower extremities spider veins.  Neurological:     General: No focal deficit present.     Mental Status: She is alert and oriented to person, place, and time.     Gait: Gait normal.  Psychiatric:        Mood and Affect: Mood normal.        Behavior: Behavior normal.        Thought Content: Thought content normal.        Judgment: Judgment normal.    Adult ECG Report  Rate: 100 ;  Rhythm: normal sinus rhythm and left axis deviation, incomplete RBBB. ; Borderline LVH with nonspecific ST and T wave changes.  Narrative Interpretation: Stable  Recent Labs:   11/18/2020: TC 170, TG 129, HDL 39 LDL 108.  A1c 5.8.  Hgb 14.5. Cr 1: 6, K+ 3.7. -> Labs ordered today Lab Results  Component Value Date   CHOL 183 08/30/2021   HDL 38 (L) 08/30/2021   LDLCALC 122 (H) 08/30/2021   TRIG 130 08/30/2021   CHOLHDL 4.8 (H) 08/30/2021   Lab Results  Component Value Date   CREATININE 1.29 (H) 08/30/2021   BUN 20 08/30/2021   NA 140 08/30/2021   K 3.5 08/30/2021   CL 103 08/30/2021   CO2 22 08/30/2021   CBC Latest Ref Rng & Units 06/01/2020 06/30/2018 11/20/2014  WBC 4.0 - 10.5 K/uL 4.3 4.3 3.7(L)  Hemoglobin 12.0 - 15.0 g/dL 13.5 14.1 12.7  Hematocrit 36.0 - 46.0 % 41.8 43.1 37.5  Platelets 150 - 400 K/uL 214 233 229    Lab Results  Component Value Date   HGBA1C 6.0 (H) 08/30/2021   Lab Results  Component Value Date   TSH 2.473 11/20/2014    ==================================================  COVID-19 Education: The  signs and symptoms of COVID-19 were discussed with the patient and how to seek care for testing (follow up with PCP or arrange E-visit).    I spent a total of 26 minutes with the patient spent in direct patient consultation.  Additional time spent with chart review  / charting (studies, outside notes, etc): 15 min Total Time: 41 min  Current medicines are reviewed at length with the patient today.  (+/- concerns) none  This visit occurred during the SARS-CoV-2 public health emergency.  Safety protocols were in place, including screening questions prior to the visit, additional usage of staff PPE, and extensive cleaning of exam room while observing appropriate contact time as indicated for disinfecting solutions.  Notice: This dictation was prepared with Dragon dictation along with smart phrase technology. Any transcriptional errors that result from this process are unintentional and may not be corrected upon review.  Patient Instructions / Medication Changes & Studies & Tests Ordered   Patient Instructions  Medication Instructions:  NO CHANGES *If you need a refill on your cardiac medications before your next appointment, please call your pharmacy*   Lab Work:  [place] If you have labs (blood work) drawn today and your tests are completely normal, you will receive  your results only by: [place] (if you have MyChart) OR A paper copy in the mail If you have any lab test that is abnormal or we need to change your treatment, we will call you to review the results.   Testing/Procedures: NOT NEEDED   Follow-Up: At [facility], you and your health needs are our priority.  As part of our continuing mission to provide you with exceptional heart care, we have created designated Provider Care Teams.  These Care Teams include your primary Cardiologist (physician) and Advanced Practice Providers (APPs -  Physician Assistants and Nurse Practitioners) who all work together to  provide you with the care you need, when you need it.  We recommend signing up for the patient portal called "MyChart".  Sign up information is provided on this After Visit Summary.  MyChart is used to connect with patients for Virtual Visits (Telemedicine).  Patients are able to view lab/test results, encounter notes, upcoming appointments, etc.  Non-urgent messages can be sent to your provider as well.   To learn more about what you can do with MyChart, go to [email or web address].    Your next appointment:   12 month(s)  The format for your next appointment:   In Person  Provider:   Glenetta Hew, MD          Studies Ordered:   Orders Placed This Encounter  Procedures   Lipid panel   Comprehensive metabolic panel   Hemoglobin A1c   EKG 12-Lead     Glenetta Hew, M.D., M.S. Interventional Cardiologist   Pager # [phone] Phone # [phone] [address] [place], [place] [address]   Thank you for choosing Heartcare at [facility]!!

## 2021-08-30 DIAGNOSIS — I10 Essential (primary) hypertension: Secondary | ICD-10-CM | POA: Diagnosis not present

## 2021-08-30 DIAGNOSIS — R7301 Impaired fasting glucose: Secondary | ICD-10-CM | POA: Diagnosis not present

## 2021-08-30 DIAGNOSIS — E668 Other obesity: Secondary | ICD-10-CM | POA: Diagnosis not present

## 2021-08-30 DIAGNOSIS — R000 Tachycardia, unspecified: Secondary | ICD-10-CM | POA: Diagnosis not present

## 2021-08-30 DIAGNOSIS — I428 Other cardiomyopathies: Secondary | ICD-10-CM | POA: Diagnosis not present

## 2021-08-30 DIAGNOSIS — E785 Hyperlipidemia, unspecified: Secondary | ICD-10-CM | POA: Diagnosis not present

## 2021-08-30 LAB — COMPREHENSIVE METABOLIC PANEL
ALT: 11 IU/L (ref 0–32)
AST: 18 IU/L (ref 0–40)
Albumin/Globulin Ratio: 1.3 (ref 1.2–2.2)
Albumin: 3.9 g/dL (ref 3.7–4.7)
Alkaline Phosphatase: 88 IU/L (ref 44–121)
BUN/Creatinine Ratio: 16 (ref 12–28)
BUN: 20 mg/dL (ref 8–27)
Bilirubin Total: 0.4 mg/dL (ref 0.0–1.2)
CO2: 22 mmol/L (ref 20–29)
Calcium: 9.5 mg/dL (ref 8.7–10.3)
Chloride: 103 mmol/L (ref 96–106)
Creatinine, Ser: 1.29 mg/dL — ABNORMAL HIGH (ref 0.57–1.00)
Globulin, Total: 3.1 g/dL (ref 1.5–4.5)
Glucose: 97 mg/dL (ref 70–99)
Potassium: 3.5 mmol/L (ref 3.5–5.2)
Sodium: 140 mmol/L (ref 134–144)
Total Protein: 7 g/dL (ref 6.0–8.5)
eGFR: 43 mL/min/{1.73_m2} — ABNORMAL LOW (ref >59)

## 2021-08-30 LAB — LIPID PANEL
Chol/HDL Ratio: 4.8 ratio — ABNORMAL HIGH (ref 0.0–4.4)
Cholesterol, Total: 183 mg/dL (ref 100–199)
HDL: 38 mg/dL — ABNORMAL LOW (ref >39)
LDL Chol Calc (NIH): 122 mg/dL — ABNORMAL HIGH (ref 0–99)
Triglycerides: 130 mg/dL (ref 0–149)
VLDL Cholesterol Cal: 23 mg/dL (ref 5–40)

## 2021-08-30 LAB — HEMOGLOBIN A1C
Est. average glucose Bld gHb Est-mCnc: 126 mg/dL
Hgb A1c MFr Bld: 6 % — ABNORMAL HIGH (ref 4.8–5.6)

## 2021-08-31 ENCOUNTER — Telehealth: Payer: Self-pay | Admitting: Cardiology

## 2021-08-31 NOTE — Telephone Encounter (Signed)
Pt is reaching out for lab work results...Marland Kitchen please advise.

## 2021-08-31 NOTE — Telephone Encounter (Signed)
Returned patient call and informed pt. Lab results have not yet been reviewed. Will give pt. A call when they have been reviewed.

## 2021-09-01 NOTE — Telephone Encounter (Signed)
Patient would like to speak to [facility].

## 2021-09-01 NOTE — Telephone Encounter (Signed)
Spoke to patient . Preliminary report given. Patient aware will receive call once final result are received.

## 2021-09-08 ENCOUNTER — Ambulatory Visit: Payer: Medicare Other | Admitting: Nurse Practitioner

## 2021-09-13 NOTE — Telephone Encounter (Signed)
Pt is returning call for results

## 2021-09-18 ENCOUNTER — Encounter: Payer: Self-pay | Admitting: Cardiology

## 2021-09-18 NOTE — Assessment & Plan Note (Signed)
Blood pressure looks good.  She held her losartan and HCTZ perioperatively and has not been restarted.  I do think we have room to increase beta-blocker to 75 mg twice daily for tachycardia purposes.

## 2021-09-18 NOTE — Assessment & Plan Note (Signed)
Slowly losing weight, doing better.  Needs to continue to slowly drop weight by increasing exercise level and watching diet.  Hopes this will help her lipids as well.

## 2021-09-18 NOTE — Assessment & Plan Note (Signed)
Stable, multifactorial.  She has picked up her exercise level.  Hopefully this will help with weight loss and increasing conditioning.

## 2021-09-18 NOTE — Assessment & Plan Note (Signed)
Pretty much fully resolved with most recent echo showing EF of 50 to 55% and no R WMA.  No real active heart failure symptoms.  I think her exertional dyspnea is because of deconditioning and obesity.  She is on stable dose of beta-blocker, but no longer on the ARB-HCTZ, combination with well-controlled blood pressure.  Euvolemic on exam..

## 2021-09-18 NOTE — Assessment & Plan Note (Signed)
Labs in January showed LDL 108.  Labs drawn today show LDL 122.  Not on statin.  Defer to PCP, however I suspect that she may require some type of therapy.

## 2021-09-19 ENCOUNTER — Encounter: Payer: Self-pay | Admitting: *Deleted

## 2021-11-21 DIAGNOSIS — N183 Chronic kidney disease, stage 3 unspecified: Secondary | ICD-10-CM | POA: Diagnosis not present

## 2021-11-21 DIAGNOSIS — F39 Unspecified mood [affective] disorder: Secondary | ICD-10-CM | POA: Diagnosis not present

## 2021-11-21 DIAGNOSIS — G4700 Insomnia, unspecified: Secondary | ICD-10-CM | POA: Diagnosis not present

## 2021-11-21 DIAGNOSIS — Z8572 Personal history of non-Hodgkin lymphomas: Secondary | ICD-10-CM | POA: Diagnosis not present

## 2021-11-21 DIAGNOSIS — I700 Atherosclerosis of aorta: Secondary | ICD-10-CM | POA: Diagnosis not present

## 2021-11-21 DIAGNOSIS — R7303 Prediabetes: Secondary | ICD-10-CM | POA: Diagnosis not present

## 2021-11-21 DIAGNOSIS — Z0000 Encounter for general adult medical examination without abnormal findings: Secondary | ICD-10-CM | POA: Diagnosis not present

## 2021-11-21 DIAGNOSIS — Z1389 Encounter for screening for other disorder: Secondary | ICD-10-CM | POA: Diagnosis not present

## 2021-11-21 DIAGNOSIS — I129 Hypertensive chronic kidney disease with stage 1 through stage 4 chronic kidney disease, or unspecified chronic kidney disease: Secondary | ICD-10-CM | POA: Diagnosis not present

## 2021-11-21 DIAGNOSIS — K589 Irritable bowel syndrome without diarrhea: Secondary | ICD-10-CM | POA: Diagnosis not present

## 2021-11-21 DIAGNOSIS — E7800 Pure hypercholesterolemia, unspecified: Secondary | ICD-10-CM | POA: Diagnosis not present

## 2022-05-29 DIAGNOSIS — R251 Tremor, unspecified: Secondary | ICD-10-CM | POA: Diagnosis not present

## 2022-05-29 DIAGNOSIS — H9190 Unspecified hearing loss, unspecified ear: Secondary | ICD-10-CM | POA: Diagnosis not present

## 2022-05-31 ENCOUNTER — Other Ambulatory Visit: Payer: Self-pay | Admitting: Family Medicine

## 2022-05-31 DIAGNOSIS — Z1231 Encounter for screening mammogram for malignant neoplasm of breast: Secondary | ICD-10-CM

## 2022-06-03 DIAGNOSIS — H6123 Impacted cerumen, bilateral: Secondary | ICD-10-CM | POA: Diagnosis not present

## 2022-06-13 ENCOUNTER — Ambulatory Visit: Payer: Self-pay

## 2022-06-27 ENCOUNTER — Ambulatory Visit
Admission: RE | Admit: 2022-06-27 | Discharge: 2022-06-27 | Disposition: A | Discharge status: Home / Self Care | Payer: No Typology Code available for payment source | Source: Ambulatory Visit | Attending: Family Medicine | Admitting: Family Medicine

## 2022-06-27 DIAGNOSIS — Z1231 Encounter for screening mammogram for malignant neoplasm of breast: Secondary | ICD-10-CM

## 2022-06-29 ENCOUNTER — Institutional Professional Consult (permissible substitution): Payer: Medicare Other | Admitting: Diagnostic Neuroimaging

## 2022-08-01 ENCOUNTER — Encounter: Payer: Self-pay | Admitting: *Deleted

## 2022-08-02 ENCOUNTER — Encounter: Payer: Self-pay | Admitting: Diagnostic Neuroimaging

## 2022-08-02 ENCOUNTER — Ambulatory Visit (INDEPENDENT_AMBULATORY_CARE_PROVIDER_SITE_OTHER): Payer: No Typology Code available for payment source | Admitting: Diagnostic Neuroimaging

## 2022-08-02 ENCOUNTER — Telehealth: Payer: Self-pay | Admitting: Diagnostic Neuroimaging

## 2022-08-02 VITALS — BP 119/80 | HR 81 | Ht 65.0 in | Wt 220.1 lb

## 2022-08-02 DIAGNOSIS — R269 Unspecified abnormalities of gait and mobility: Secondary | ICD-10-CM | POA: Diagnosis not present

## 2022-08-02 DIAGNOSIS — G8929 Other chronic pain: Secondary | ICD-10-CM

## 2022-08-02 DIAGNOSIS — M5442 Lumbago with sciatica, left side: Secondary | ICD-10-CM | POA: Diagnosis not present

## 2022-08-02 DIAGNOSIS — M5441 Lumbago with sciatica, right side: Secondary | ICD-10-CM | POA: Diagnosis not present

## 2022-08-02 NOTE — Progress Notes (Signed)
[facility]  PATIENT: Crystal [name] DOB: [birth year]  REFERRING CLINICIAN: Gaynelle Arabian, MD HISTORY FROM: patient REASON FOR VISIT: new consult   HISTORICAL  CHIEF COMPLAINT:  Chief Complaint  Patient presents with   New Patient (Initial Visit)    Pt reports feeling good. She has questions about if her tremors are going to get better or worse. She reports that she can't up for a long period of time. Room 6 with sister    HISTORY OF PRESENT ILLNESS:   UPDATE (08/02/22, VRP): Since last visit, patient continues to have intermittent shaking sensation in legs when she stands up for a few minutes at a time.  If she is walking moving symptoms are resolved.  Has some chronic low back pain issues which radiate to the legs.  Some tremor in arms when she stands up as well.  PRIOR HPI (Dr. Jannifer Franklin, 02/11/14): "Ms. Sloma is a 76 year old white female with a history of problems with leg tremors that have been present since 1989. The patient indicates that over time, the issue has not significantly worsened. The patient reports an unusual history of problems with feeling tremor in the legs and throughout the body when she stands still. The patient cannot remain standing for very long. The patient indicates that she can prolong the period time which is able to remain standing back moving and walking. The patient however, cannot remain standing for indefinite periods of time. The patient will have to sit down frequently and rest. As soon as she sits down, she feels better. Associated with the tremulousness is profuse sweating, and the patient also reports onset of urinary urgency, and sensation of needing to have a bowel movement, occasionally with diarrhea. The patient indicates that this has been a problem for her since 1989. The patient had breast cancer in 1999, and then subsequently she developed lymphoma. The patient has undergone chemotherapy for both of these issues. The patient  denies headache, and she denies any blackouts or falls. The patient reports no focal numbness or weakness of the extremities. At times, the patient indicates that she may appear flushed. The patient is sent to this office for further evaluation."   REVIEW OF SYSTEMS: Full 14 system review of systems performed and negative with exception of: as per HPI.  ALLERGIES: Allergies  Allergen Reactions   Simvastatin     Leg cramps   Zoloft [Sertraline] Nausea Only   Codeine Other (See Comments)    Bad Headaches   Coreg Other (See Comments)    "Made my legs hurt"   Lisinopril Cough   Tizanidine Hcl Rash    hypotension    HOME MEDICATIONS: Outpatient Medications Prior to Visit  Medication Sig Dispense Refill   aspirin EC 81 MG tablet Take 81 mg by mouth every morning.     ibuprofen (ADVIL) 100 MG tablet Take 100 mg by mouth every 6 (six) hours as needed for fever.     losartan-hydrochlorothiazide (HYZAAR) 50-12.5 MG tablet Take 1 tablet by mouth daily.     metoprolol tartrate (LOPRESSOR) 50 MG tablet Take 50 mg by mouth 2 (two) times daily.     promethazine-dextromethorphan (PROMETHAZINE-DM) 6.25-15 MG/5ML syrup Take 5 mLs by mouth every 6 (six) hours as needed.     acetaminophen (TYLENOL) 500 MG tablet Take 500 mg by mouth every 6 (six) hours as needed for moderate pain. (Patient not taking: Reported on 08/02/2022)     No facility-administered medications prior to visit.  PAST MEDICAL HISTORY: Past Medical History:  Diagnosis Date   Anxiety    Atherosclerosis of aorta ([place])    Breast cancer ([place]) 1998   (Rt) lumpectomy dx 1999; Dr. Benay Spice   Dyslipidemia, goal LDL below 130    Essential hypertension    GERD (gastroesophageal reflux disease)    Hypercholesterolemia    IBS (irritable bowel syndrome)    Lymphoma ([place])    lymphoma dx 11/28/10 - left neck   non hodgkins lymphoma 12/2010   right aprotid gland   Nonischemic cardiomyopathy ([place]) 2008   ? Doxorubincin induced;  essentially resolved as of echo in January 2014, current EF 50-55%. Grade 1 diastolic dysfunction.   Obesity    Severe obesity (BMI >= 40) ([place]) 05/21/2013   Improve to BMI of 39 by July 2015   Tremor     PAST SURGICAL HISTORY: Past Surgical History:  Procedure Laterality Date   ABDOMINAL HYSTERECTOMY     BSO   ANKLE FRACTURE SURGERY Left    BREAST EXCISIONAL BIOPSY Left    BREAST EXCISIONAL BIOPSY Right    BREAST LUMPECTOMY Right 1998   BREAST LUMPECTOMY Right 08/25/2020   Procedure: RIGHT BREAST LUMPECTOMY;  Surgeon: Coralie Keens, MD;  Location: [facility];  Service: General;  Laterality: Right;   CHOLECYSTECTOMY N/A 06/09/2020   Procedure: LAPAROSCOPIC CHOLECYSTECTOMY;  Surgeon: Coralie Keens, MD;  Location: [facility];  Service: General;  Laterality: N/A;   COLONOSCOPY     LEFT HEART CATH AND CORONARY ANGIOGRAPHY  12/2007   None coronary disease, EF 45% (up from nuclear study EF of 30% in June '08)   NM MYOVIEW LTD  03/29/2007   EF 33%,NEGATIVE ISCHEMIA, prob need cath   NM MYOVIEW LTD  05/01/2014   Lexiscan: EF 55%. Normal wall motion; no ischemia or infarction; apical thinning   PORTACATH PLACEMENT  08/25/2013   rt. with tip in cavoatrial junction, Dr.Yamagata    TRANSTHORACIC ECHOCARDIOGRAM  05/01/2014   Normal LV size with low normal function. EF of 50-55% and Gr 1 DD; aortic sclerosis without stenosis. MAC and thickening/calcification of the anterior leaflet - no notable AI / AS or MR/MS    FAMILY HISTORY: Family History  Problem Relation Age of Onset   Heart Problems Mother        CABG   Chronic Renal Failure Mother    Heart Problems Father    Hypertension Sister    Cancer Sister        bladder   Migraines Sister    Heart attack Sister    Hypertension Sister        x2   Lupus Brother    Hypertension Brother        and lupus   Heart Problems Maternal Grandmother    Stroke Maternal Grandfather    Cancer Paternal Grandmother         stomach   Heart Problems Paternal Grandfather     SOCIAL HISTORY: Social History   Socioeconomic History   Marital status: Widowed    Spouse name: Not on file   Number of children: 3   Years of education: hs   Highest education level: Not on file  Occupational History   Occupation: Retired  Tobacco Use   Smoking status: Never   Smokeless tobacco: Never  Vaping Use   Vaping Use: Never used  Substance and Sexual Activity   Alcohol use: Yes    Comment: social    Drug use: No   Sexual activity:  Not on file  Other Topics Concern   Not on file  Social History Narrative   She is a widow mother of 2, with 1 child is deceased. She has 2 grandchildren. She is trying to get some walking in town and can do about 20-25 minutes his another 30 minutes she pushes a more night gets short of breath.   She is at retired [employer], he simply laid off after 45 years.   She usually comes accompanied by her sister, who is also my patient.   She never was a smoker, and only occasionally has a social alcoholic beverage.   Social Determinants of Health   Financial Resource Strain: Not on file  Food Insecurity: Not on file  Transportation Needs: Not on file  Physical Activity: Not on file  Stress: Not on file  Social Connections: Not on file  Intimate Partner Violence: Not on file     PHYSICAL EXAM  GENERAL EXAM/CONSTITUTIONAL: Vitals:  Vitals:   08/02/22 1033  BP: 119/80  Pulse: 81  Weight: 220 lb 2 oz (99.8 kg)  Height: _0  (1.651 m)   Body mass index is 36.63 kg/m. Wt Readings from Last 3 Encounters:  08/02/22 220 lb 2 oz (99.8 kg)  08/29/21 214 lb 3.2 oz (97.2 kg)  09/07/20 221 lb 8 oz (100.5 kg)   Patient is in no distress; well developed, nourished and groomed; neck is supple  CARDIOVASCULAR: Examination of carotid arteries is normal; no carotid bruits Regular rate and rhythm, no murmurs Examination of peripheral vascular system by observation and palpation  is normal  EYES: Ophthalmoscopic exam of optic discs and posterior segments is normal; no papilledema or hemorrhages No results found.  MUSCULOSKELETAL: Gait, strength, tone, movements noted in Neurologic exam below  NEUROLOGIC: MENTAL STATUS:      No data to display         awake, alert, oriented to person, place and time recent and remote memory intact normal attention and concentration language fluent, comprehension intact, naming intact fund of knowledge appropriate  CRANIAL NERVE:  2nd - no papilledema on fundoscopic exam 2nd, 3rd, 4th, 6th - pupils equal and reactive to light, visual fields full to confrontation, extraocular muscles intact, no nystagmus 5th - facial sensation symmetric 7th - facial strength symmetric 8th - hearing intact 9th - palate elevates symmetrically, uvula midline 11th - shoulder shrug symmetric 12th - tongue protrusion midline  MOTOR:  normal bulk and tone, full strength in the BUE, BLE  SENSORY:  normal and symmetric to light touch, temperature, vibration  COORDINATION:  finger-nose-finger, fine finger movements normal  REFLEXES:  deep tendon reflexes TRACE and symmetric  GAIT/STATION:  WIDE BASED STATION; UNSTEADY IN EXAM ROOM; DIFF STANDING STILL (LEGS AND ARMS SLIGHTLY BOB UP AND DOWN; WHEN WALKING DOWN HALLWAY, SMOOTH STRIDE AND NARROW GAIT)     DIAGNOSTIC DATA (LABS, IMAGING, TESTING) - I reviewed patient records, labs, notes, testing and imaging myself where available.  Lab Results  Component Value Date   WBC 4.3 06/01/2020   HGB 13.5 06/01/2020   HCT 41.8 06/01/2020   MCV 93.3 06/01/2020   PLT 214 06/01/2020      Component Value Date/Time   NA 140 08/30/2021 0843   NA 147 (H) 01/27/2014 0923   K 3.5 08/30/2021 0843   K 4.1 01/27/2014 0923   CL 103 08/30/2021 0843   CO2 22 08/30/2021 0843   CO2 27 01/27/2014 0923   GLUCOSE 97 08/30/2021 0843  GLUCOSE 98 08/19/2020 1130   GLUCOSE 132 01/27/2014 0923   BUN  20 08/30/2021 0843   BUN 18.4 01/27/2014 0923   CREATININE 1.29 (H) 08/30/2021 0843   CREATININE 1.07 12/08/2014 0817   CREATININE 1.0 01/27/2014 0923   CALCIUM 9.5 08/30/2021 0843   CALCIUM 9.2 01/27/2014 0923   PROT 7.0 08/30/2021 0843   PROT 6.4 01/27/2014 0923   ALBUMIN 3.9 08/30/2021 0843   ALBUMIN 3.2 (L) 01/27/2014 0923   AST 18 08/30/2021 0843   AST 27 01/27/2014 0923   ALT 11 08/30/2021 0843   ALT 24 01/27/2014 0923   ALKPHOS 88 08/30/2021 0843   ALKPHOS 111 01/27/2014 0923   BILITOT 0.4 08/30/2021 0843   BILITOT 0.30 01/27/2014 0923   GFRNONAA 46 (L) 08/19/2020 1130   GFRNONAA 53 (L) 05/30/2013 1523   GFRAA 48 (L) 06/01/2020 0928   GFRAA 62 05/30/2013 1523   Lab Results  Component Value Date   CHOL 183 08/30/2021   HDL 38 (L) 08/30/2021   LDLCALC 122 (H) 08/30/2021   TRIG 130 08/30/2021   CHOLHDL 4.8 (H) 08/30/2021   Lab Results  Component Value Date   HGBA1C 6.0 (H) 08/30/2021   No results found for: "VITAMINB12" Lab Results  Component Value Date   TSH 2.473 11/20/2014    02/15/14 MRI brain -Slightly abnormal MRI scan of brain showing mild changes of  chronic microvascular ischemia. No structural lesion, tumor  or infarcts  are noted. No enhancing lesions are noted.     ASSESSMENT AND PLAN  76 y.o. year old female here with:   Dx:  1. Gait difficulty   2. Chronic bilateral low back pain with bilateral sciatica       PLAN:  ORTHOSTATIC TREMOR / GAIT INSTABILITY / BACK PAIN (radiating to legs) --> since ~1989 - check MRI lumbar spine (rule out spinal stenosis) - check labs (NMJ dz, myopathy, neuropathy eval) - use cane /walker; refer to PT evaluation - could try gabapentin or clonazepam in future  Orders Placed This Encounter  Procedures   MR LUMBAR SPINE WO CONTRAST   Vitamin B12   AChR Abs with Reflex to MuSK   CK   Aldolase   Ambulatory referral to Physical Therapy   Return in about 6 months (around 02/01/2023).    [name], MD 80/12/4915, 91:50 AM Certified in Neurology, Neurophysiology and Neuroimaging  [facility] [address], [place], [place] [address] [phone]

## 2022-08-02 NOTE — Telephone Encounter (Signed)
Referral for Physical Therapy sent through Epic to [facility].

## 2022-08-03 ENCOUNTER — Other Ambulatory Visit: Payer: No Typology Code available for payment source

## 2022-08-03 DIAGNOSIS — Z0289 Encounter for other administrative examinations: Secondary | ICD-10-CM

## 2022-08-07 ENCOUNTER — Other Ambulatory Visit (INDEPENDENT_AMBULATORY_CARE_PROVIDER_SITE_OTHER): Payer: Self-pay

## 2022-08-07 DIAGNOSIS — R269 Unspecified abnormalities of gait and mobility: Secondary | ICD-10-CM | POA: Diagnosis not present

## 2022-08-07 DIAGNOSIS — M5442 Lumbago with sciatica, left side: Secondary | ICD-10-CM | POA: Diagnosis not present

## 2022-08-07 DIAGNOSIS — G8929 Other chronic pain: Secondary | ICD-10-CM | POA: Diagnosis not present

## 2022-08-07 DIAGNOSIS — Z0289 Encounter for other administrative examinations: Secondary | ICD-10-CM

## 2022-08-07 DIAGNOSIS — M5441 Lumbago with sciatica, right side: Secondary | ICD-10-CM | POA: Diagnosis not present

## 2022-08-08 ENCOUNTER — Telehealth: Payer: Self-pay | Admitting: Diagnostic Neuroimaging

## 2022-08-08 ENCOUNTER — Ambulatory Visit: Payer: No Typology Code available for payment source | Attending: Diagnostic Neuroimaging | Admitting: Physical Therapy

## 2022-08-08 DIAGNOSIS — M5441 Lumbago with sciatica, right side: Secondary | ICD-10-CM | POA: Diagnosis not present

## 2022-08-08 DIAGNOSIS — M5442 Lumbago with sciatica, left side: Secondary | ICD-10-CM | POA: Diagnosis not present

## 2022-08-08 DIAGNOSIS — Z9181 History of falling: Secondary | ICD-10-CM | POA: Insufficient documentation

## 2022-08-08 DIAGNOSIS — G8929 Other chronic pain: Secondary | ICD-10-CM | POA: Insufficient documentation

## 2022-08-08 DIAGNOSIS — R269 Unspecified abnormalities of gait and mobility: Secondary | ICD-10-CM | POA: Insufficient documentation

## 2022-08-08 DIAGNOSIS — M6281 Muscle weakness (generalized): Secondary | ICD-10-CM | POA: Diagnosis not present

## 2022-08-08 DIAGNOSIS — R2681 Unsteadiness on feet: Secondary | ICD-10-CM | POA: Insufficient documentation

## 2022-08-08 NOTE — Therapy (Signed)
OUTPATIENT PHYSICAL THERAPY NEURO EVALUATION   Patient Name: Crystal [name] MRN: [id number] DOB:08/18/1946, 76 y.o., female Today's Date: 08/08/2022   PCP: Gaynelle Arabian, MD REFERRING PROVIDER: [name], MD    PT End of Session - 08/08/22 [phone]     Visit Number 1    Number of Visits 13   Plus eval   Date for PT Re-Evaluation 09/26/22    Authorization Type [employer] [place]    PT Start Time [phone]    PT Stop Time 8299    PT Time Calculation (min) 39 min    Activity Tolerance Patient tolerated treatment well    Behavior During Therapy [facility] for tasks assessed/performed             Past Medical History:  Diagnosis Date   Anxiety    Atherosclerosis of aorta ([place])    Breast cancer ([place]) 1998   (Rt) lumpectomy dx 1999; Dr. Benay Spice   Dyslipidemia, goal LDL below 130    Essential hypertension    GERD (gastroesophageal reflux disease)    Hypercholesterolemia    IBS (irritable bowel syndrome)    Lymphoma ([place])    lymphoma dx 11/28/10 - left neck   non hodgkins lymphoma 12/2010   right aprotid gland   Nonischemic cardiomyopathy ([place]) 2008   ? Doxorubincin induced; essentially resolved as of echo in January 2014, current EF 50-55%. Grade 1 diastolic dysfunction.   Obesity    Severe obesity (BMI >= 40) ([place]) 05/21/2013   Improve to BMI of 39 by July 2015   Tremor    Past Surgical History:  Procedure Laterality Date   ABDOMINAL HYSTERECTOMY     BSO   ANKLE FRACTURE SURGERY Left    BREAST EXCISIONAL BIOPSY Left    BREAST EXCISIONAL BIOPSY Right    BREAST LUMPECTOMY Right 1998   BREAST LUMPECTOMY Right 08/25/2020   Procedure: RIGHT BREAST LUMPECTOMY;  Surgeon: Coralie Keens, MD;  Location: [facility];  Service: General;  Laterality: Right;   CHOLECYSTECTOMY N/A 06/09/2020   Procedure: LAPAROSCOPIC CHOLECYSTECTOMY;  Surgeon: Coralie Keens, MD;  [facility];  Service: General;  Laterality: N/A;   COLONOSCOPY     LEFT HEART CATH AND  CORONARY ANGIOGRAPHY  12/2007   None coronary disease, EF 45% (up from nuclear study EF of 30% in June '08)   NM MYOVIEW LTD  03/29/2007   EF 33%,NEGATIVE ISCHEMIA, prob need cath   NM MYOVIEW LTD  05/01/2014   Lexiscan: EF 55%. Normal wall motion; no ischemia or infarction; apical thinning   PORTACATH PLACEMENT  08/25/2013   rt. with tip in cavoatrial junction, Dr.Yamagata    TRANSTHORACIC ECHOCARDIOGRAM  05/01/2014   Normal LV size with low normal function. EF of 50-55% and Gr 1 DD; aortic sclerosis without stenosis. MAC and thickening/calcification of the anterior leaflet - no notable AI / AS or MR/MS   Patient Active Problem List   Diagnosis Date Noted   Tachycardia 08/29/2021   Preop cardiovascular exam 08/29/2020   Symptomatic cholelithiasis 06/09/2020   S/P laparoscopic cholecystectomy 06/09/2020   Chest pain with moderate risk for cardiac etiology 02/14/2018   Right upper quadrant pain 02/12/2018   Tremor 02/11/2014   Non Hodgkin's lymphoma ([place]) 08/18/2013   Neck pain on left side 05/30/2013   Dizziness 05/30/2013   Moderate obesity 05/21/2013   Lung crackles 05/21/2013   Dyspnea on exertion 05/21/2013   Nonischemic cardiomyopathy ([place])    Essential hypertension    Dyslipidemia, goal LDL  below 100     ONSET DATE: 08/02/2022   REFERRING DIAG: R26.9 (ICD-10-CM) - Gait difficulty M54.42,M54.41,G89.29 (ICD-10-CM) - Chronic bilateral low back pain with bilateral sciatica   THERAPY DIAG:  Muscle weakness (generalized)  Unsteadiness on feet  History of falling  Rationale for Evaluation and Treatment Rehabilitation  SUBJECTIVE:                                                                                                                                                                                              SUBJECTIVE STATEMENT: Pt reports she started having leg tremors in 1989, stated she was and still is unable to stand still because her legs quiver and she  feels as though she will lose her balance. Pt reported she had her first major fall a few weeks ago coming out of kitchen door (she tripped over step) and ever since, her low back hurts tremendously when standing. Currently is sitting for all ADLs. Pt reports she can walk longer if holding on to something, like a buggy at the grocery store. Pt reports she likes to go visit people in SNFs and walking down the long hallway is about her max walking distance.   Pt accompanied by: self  PERTINENT HISTORY: Gait diff; orthostatic tremor; back pain; leg weakness. Hodgkin's Lymphoma complicated by nonischemic cardiomyopathy  PAIN:  Are you having pain? No and pt denies pain at rest but states her low back can get up to 8/10 with walking  PRECAUTIONS: Fall  WEIGHT BEARING RESTRICTIONS No  FALLS: Has patient fallen in last 6 months? Yes. Number of falls 1  LIVING ENVIRONMENT: Lives with: lives alone Lives in: House/apartment Stairs: Yes: External: 1 steps; none Has following equipment at home: Single point cane, Walker - 4 wheeled, Shower bench, and pt reports her 4 wheeled walker is in the attic  PLOF: Independent  PATIENT GOALS "my legs, seeing if I can control the tremors"   OBJECTIVE:   DIAGNOSTIC FINDINGS: MRI scheduled for 10/30   COGNITION: Overall cognitive status: Within functional limits for tasks assessed   SENSATION: WFL   POSTURE: rounded shoulders, forward head, and increased thoracic kyphosis   LOWER EXTREMITY MMT:  Tested in seated position   MMT Right Eval Left Eval  Hip flexion 4 4  Hip extension    Hip abduction 4 4  Hip adduction 4- 4-  Hip internal rotation    Hip external rotation    Knee flexion 4- 4  Knee extension 3+ 4  Ankle dorsiflexion 4 4  Ankle plantarflexion    Ankle inversion    Ankle eversion    (  Blank rows = not tested)  BED MOBILITY:  Independent per pt  TRANSFERS: Assistive device utilized: None  Sit to stand: SBA heavy reliance  on BUEs, wide BOS, difficulty w/immediate standing balance  Stand to sit: SBA due to poor eccentric control  GAIT: Gait pattern: step through pattern, decreased arm swing- Right, decreased arm swing- Left, decreased stride length, decreased hip/knee flexion- Right, decreased hip/knee flexion- Left, decreased ankle dorsiflexion- Right, decreased ankle dorsiflexion- Left, decreased trunk rotation, and wide BOS Distance walked: Various short clinic distances  Assistive device utilized: None Level of assistance: SBA and CGA Comments: Pt ambulates w/very guarded behavior (BUEs in abduction, wide BOS, rigidity in spine) and noted instability w/turns   FUNCTIONAL TESTs:   [facility] PT Assessment - 08/08/22 1000       Transfers   Five time sit to stand comments  25.68s w/BUE support   Difficulty w/immediate standing balance     Ambulation/Gait   Gait velocity 32.8' over 12.59s = 2.61 ft/s without AD             TODAY'S TREATMENT:  Next Session    PATIENT EDUCATION: Education details: POC, eval findings, trying to find her rollator at home  Person educated: Patient Education method: Customer service manager Education comprehension: verbalized understanding   HOME EXERCISE PROGRAM: To be established     GOALS: Goals reviewed with patient? Yes  SHORT TERM GOALS: Target date: 08/29/2022  Pt will be independent with initial HEP for improved strength, balance, transfers and gait.  Baseline: Goal status: INITIAL  2.  Berg to be performed and STG/LTG written Baseline:  Goal status: INITIAL  3.  Pt will improve gait velocity to at least 3.38f/s w/LRAD for improved gait efficiency   Baseline: 2.61 ft/s without AD Goal status: INITIAL  4.  Pt will improve 5 x STS to less than or equal to 20 seconds w/BUE support to demonstrate improved functional strength and transfer efficiency.   Baseline:  Goal status: INITIAL  5.  MCTSIB to be performed and LTG written  Baseline:   Goal status: INITIAL   LONG TERM GOALS: Target date: 09/19/2022  Pt will be independent with final HEP for improved strength, balance, transfers and gait.  Baseline:  Goal status: INITIAL  2.  Pt will improve gait velocity to at least 3.4 ft/s w/LRAD for improved gait efficiency   Baseline: 2.62fs without AD Goal status: INITIAL  3.  Berg goal  Baseline:  Goal status: INITIAL  4.  MCTSIB goal Baseline:  Goal status: INITIAL  5.  Pt will ambulate 250' on level and unlevel surfaces w/LRAD and < 5/10 low back pain for improved functional mobility and independence  Baseline:  Goal status: INITIAL   ASSESSMENT:  CLINICAL IMPRESSION: Patient is a [age]ear old female referred to Neuro O[facility] for gait abnormality and low back pain . Pt's PMH is significant for: Hodgkin's Lymphoma complicated by nonischemic cardiomyopathy.  The following deficits were present during the exam: decreased activity tolerance, decreased balance, decreased strength and fear-avoidance behvaior. Based on fall history, 5x STS and gait speed, pt is an incr risk for falls. Pt would benefit from skilled PT to address these impairments and functional limitations to maximize functional mobility independence.     OBJECTIVE IMPAIRMENTS Abnormal gait, decreased activity tolerance, decreased balance, decreased endurance, decreased knowledge of condition, decreased mobility, difficulty walking, decreased strength, increased muscle spasms, and pain.   ACTIVITY LIMITATIONS carrying, lifting, bending, standing, squatting, stairs, transfers, reach over head, locomotion  level, and caring for others  PARTICIPATION LIMITATIONS: meal prep, cleaning, laundry, shopping, community activity, yard work, and church  PERSONAL FACTORS Age, Fitness, Past/current experiences, Time since onset of injury/illness/exacerbation, and 1 comorbidity: lack of support at home  are also affecting patient's functional outcome.   REHAB POTENTIAL:  Good  CLINICAL DECISION MAKING: Stable/uncomplicated  EVALUATION COMPLEXITY: Low  PLAN: PT FREQUENCY: 2x/week  PT DURATION: 6 weeks  PLANNED INTERVENTIONS: Therapeutic exercises, Therapeutic activity, Neuromuscular re-education, Balance training, Gait training, Patient/Family education, Self Care, Joint mobilization, Stair training, Vestibular training, Canalith repositioning, DME instructions, Aquatic Therapy, Dry Needling, Manual therapy, and Re-evaluation  PLAN FOR NEXT SESSION: Merrilee Jansky, MCTSIB, initial HEP, trial rollator    Adyn Hoes E Needham Biggins, PT, DPT 08/08/2022, 12:27 PM

## 2022-08-08 NOTE — Telephone Encounter (Signed)
[employer] auth: [id number] exp. 08/08/22-09/08/22 sent to GI [phone]

## 2022-08-11 ENCOUNTER — Ambulatory Visit: Payer: No Typology Code available for payment source | Admitting: Physical Therapy

## 2022-08-11 DIAGNOSIS — M6281 Muscle weakness (generalized): Secondary | ICD-10-CM | POA: Diagnosis not present

## 2022-08-11 DIAGNOSIS — R2681 Unsteadiness on feet: Secondary | ICD-10-CM

## 2022-08-11 NOTE — Therapy (Addendum)
OUTPATIENT PHYSICAL THERAPY NEURO TREATMENT   Patient Name: Crystal [name] MRN: [id number] DOB:Jan 15, 1946, 76 y.o., female Today's Date: 08/11/2022   PCP: Gaynelle Arabian, MD REFERRING PROVIDER: [name], MD    PT End of Session - 08/11/22 0804     Visit Number 2    Number of Visits 13   Plus eval   Date for PT Re-Evaluation 09/26/22    Authorization Type [employer] [facility] [place]    PT Start Time 0802    PT Stop Time 0848    PT Time Calculation (min) 46 min    Equipment Utilized During Treatment Gait belt    Activity Tolerance Patient tolerated treatment well;Other (comment)   Tremors   Behavior During Therapy WFL for tasks assessed/performed              Past Medical History:  Diagnosis Date   Anxiety    Atherosclerosis of aorta ([place])    Breast cancer ([place]) 1998   (Rt) lumpectomy dx 1999; Dr. Benay Spice   Dyslipidemia, goal LDL below 130    Essential hypertension    GERD (gastroesophageal reflux disease)    Hypercholesterolemia    IBS (irritable bowel syndrome)    Lymphoma ([place])    lymphoma dx 11/28/10 - left neck   non hodgkins lymphoma 12/2010   right aprotid gland   Nonischemic cardiomyopathy ([place]) 2008   ? Doxorubincin induced; essentially resolved as of echo in January 2014, current EF 50-55%. Grade 1 diastolic dysfunction.   Obesity    Severe obesity (BMI >= 40) ([place]) 05/21/2013   Improve to BMI of 39 by July 2015   Tremor    Past Surgical History:  Procedure Laterality Date   ABDOMINAL HYSTERECTOMY     BSO   ANKLE FRACTURE SURGERY Left    BREAST EXCISIONAL BIOPSY Left    BREAST EXCISIONAL BIOPSY Right    BREAST LUMPECTOMY Right 1998   BREAST LUMPECTOMY Right 08/25/2020   Procedure: RIGHT BREAST LUMPECTOMY;  Surgeon: Coralie Keens, MD;  Location: [place];  Service: General;  Laterality: Right;   CHOLECYSTECTOMY N/A 06/09/2020   Procedure: LAPAROSCOPIC CHOLECYSTECTOMY;  Surgeon: Coralie Keens, MD;  Location[facility];   Service: General;  Laterality: N/A;   COLONOSCOPY     LEFT HEART CATH AND CORONARY ANGIOGRAPHY  12/2007   None coronary disease, EF 45% (up from nuclear study EF of 30% in June '08)   NM MYOVIEW LTD  03/29/2007   EF 33%,NEGATIVE ISCHEMIA, prob need cath   NM MYOVIEW LTD  05/01/2014   Lexiscan: EF 55%. Normal wall motion; no ischemia or infarction; apical thinning   PORTACATH PLACEMENT  08/25/2013   rt. with tip in cavoatrial junction, Dr.Yamagata    TRANSTHORACIC ECHOCARDIOGRAM  05/01/2014   Normal LV size with low normal function. EF of 50-55% and Gr 1 DD; aortic sclerosis without stenosis. MAC and thickening/calcification of the anterior leaflet - no notable AI / AS or MR/MS   Patient Active Problem List   Diagnosis Date Noted   Tachycardia 08/29/2021   Preop cardiovascular exam 08/29/2020   Symptomatic cholelithiasis 06/09/2020   S/P laparoscopic cholecystectomy 06/09/2020   Chest pain with moderate risk for cardiac etiology 02/14/2018   Right upper quadrant pain 02/12/2018   Tremor 02/11/2014   Non Hodgkin's lymphoma ([place]) 08/18/2013   Neck pain on left side 05/30/2013   Dizziness 05/30/2013   Moderate obesity 05/21/2013   Lung crackles 05/21/2013   Dyspnea on exertion 05/21/2013   Nonischemic  cardiomyopathy ([place])    Essential hypertension    Dyslipidemia, goal LDL below 100     ONSET DATE: 08/02/2022   REFERRING DIAG: R26.9 (ICD-10-CM) - Gait difficulty M54.42,M54.41,G89.29 (ICD-10-CM) - Chronic bilateral low back pain with bilateral sciatica   THERAPY DIAG:  Muscle weakness (generalized)  Unsteadiness on feet  Rationale for Evaluation and Treatment Rehabilitation  SUBJECTIVE:                                                                                                                                                                                              SUBJECTIVE STATEMENT: Pt reports she is doing well, no changes since last visit.   Pt accompanied by:  self  PERTINENT HISTORY: Gait diff; orthostatic tremor; back pain; leg weakness. Hodgkin's Lymphoma complicated by nonischemic cardiomyopathy  PAIN:  Are you having pain? No and pt denies pain at rest but states her low back can get up to 8/10 with walking  PRECAUTIONS: Fall   FALLS: Has patient fallen in last 6 months? Yes. Number of falls 1  PLOF: Independent  PATIENT GOALS "my legs, seeing if I can control the tremors"   OBJECTIVE:   DIAGNOSTIC FINDINGS: MRI scheduled for 10/30    TODAY'S TREATMENT:  Ther Act  MCTSIB: Condition 1: Avg of 3 trials: 30 sec. Noted BLE tremors about 15 seconds in, pt requesting to sit after each condition  Condition 2: Avg of 3 trials: 30 sec. Noted UE/LE tremors almost immediately.  Condition 3: Avg of 3 trials: 30 sec. Pt required max verbal cues to continue as she began to have total body tremors and dyspnea. No LOB Condition 4: Avg of 3 trials: 5.27 sec (2.27, 10.68, 2.87s). Pt very guarded and anxious while performing  Total Score: 95/120   Ther Ex  Established and demonstrated initial HEP for improved BLE strength, single leg stability and low back pain modulation:   - Sit to Stand Without Arm Support, x10 reps. Minor posterior LOB on rep 6, no tremor noted.   - Standing marches on pillow while standing in corner, x5 per side. Pt unable to perform without UE support and required min cues to maintain breathing.   - Posterior pelvic tilts, x15   GAIT: Gait pattern: step through pattern, decreased arm swing- Right, decreased arm swing- Left, decreased stride length, decreased hip/knee flexion- Right, decreased hip/knee flexion- Left, decreased ankle dorsiflexion- Right, decreased ankle dorsiflexion- Left, decreased trunk rotation, and wide BOS Distance walked: Various short clinic distances  Assistive device utilized: None Level of assistance: SBA Comments: Pt w/instability upon initiation of gait, w/very wide BOS and abducted  BUEs, but  after a few steps is able to stabilize and reduce BOS   PATIENT EDUCATION: Education details: outcomes assessment, initial HEP Person educated: Patient Education method: Explanation, Demonstration, and Handouts Education comprehension: verbalized understanding   HOME EXERCISE PROGRAM: Access Code: [email or web address] URL: [email or web address] Date: 08/11/2022 Prepared by: Mickie Bail Myka Lukins  Exercises - Sit to Stand Without Arm Support  - 1 x daily - 7 x weekly - 3 sets - 10 reps - Standing marches on pillow   - 1 x daily - 7 x weekly - 3 sets - 10 reps - Posterior pelvic tilt  - 1 x daily - 7 x weekly - 3 sets - 10 reps    GOALS: Goals reviewed with patient? Yes  SHORT TERM GOALS: Target date: 08/29/2022  Pt will be independent with initial HEP for improved strength, balance, transfers and gait.  Baseline: Goal status: INITIAL  2.  Berg to be performed and STG/LTG written Baseline:  Goal status: INITIAL  3.  Pt will improve gait velocity to at least 3.53f/s w/LRAD for improved gait efficiency   Baseline: 2.61 ft/s without AD Goal status: INITIAL  4.  Pt will improve 5 x STS to less than or equal to 20 seconds w/BUE support to demonstrate improved functional strength and transfer efficiency.   Baseline:  Goal status: INITIAL  5.  MCTSIB to be performed and LTG written  Baseline:  Goal status: MET   LONG TERM GOALS: Target date: 09/19/2022  Pt will be independent with final HEP for improved strength, balance, transfers and gait.  Baseline:  Goal status: INITIAL  2.  Pt will improve gait velocity to at least 3.4 ft/s w/LRAD for improved gait efficiency   Baseline: 2.636fs without AD Goal status: INITIAL  3.  Berg goal  Baseline:  Goal status: INITIAL  4.  Pt will hold condition 4 on MCTSIB for >/= 12 seconds for improved vestibular input  Baseline: 5.27s  Goal status: REVISED  5.  Pt will ambulate 250' on level and unlevel surfaces w/LRAD and <  5/10 low back pain for improved functional mobility and independence  Baseline:  Goal status: INITIAL   ASSESSMENT:  CLINICAL IMPRESSION: Emphasis of skilled PT session on assessing major balance components and establishing initial HEP. Pt limited by tremors throughout session, starting in BLEs only and quickly progressing to total body. Pr requires frequent seated rest breaks and mod cues for diaphragmatic breathing while tremors are occurring. Pt unable to hold condition 4 of MCTSIB for >5s, losing her balance posteriorly x2 and anteriorly x1. Continue POC.     OBJECTIVE IMPAIRMENTS Abnormal gait, decreased activity tolerance, decreased balance, decreased endurance, decreased knowledge of condition, decreased mobility, difficulty walking, decreased strength, increased muscle spasms, and pain.   ACTIVITY LIMITATIONS carrying, lifting, bending, standing, squatting, stairs, transfers, reach over head, locomotion level, and caring for others  PARTICIPATION LIMITATIONS: meal prep, cleaning, laundry, shopping, community activity, yard work, and church  PERSONAL FACTORS Age, Fitness, Past/current experiences, Time since onset of injury/illness/exacerbation, and 1 comorbidity: lack of support at home  are also affecting patient's functional outcome.   REHAB POTENTIAL: Good  CLINICAL DECISION MAKING: Stable/uncomplicated  EVALUATION COMPLEXITY: Low  PLAN: PT FREQUENCY: 2x/week  PT DURATION: 6 weeks  PLANNED INTERVENTIONS: Therapeutic exercises, Therapeutic activity, Neuromuscular re-education, Balance training, Gait training, Patient/Family education, Self Care, Joint mobilization, Stair training, Vestibular training, Canalith repositioning, DME instructions, Aquatic Therapy, Dry Needling, Manual therapy, and Re-evaluation  PLAN FOR NEXT SESSION: Berg, add to HEP  for R quad strength, unlevel surfaces and vestibular input, trial rollator    Maurio Baize E Charman Blasco, PT, DPT 08/11/2022, 8:51  AM

## 2022-08-15 ENCOUNTER — Ambulatory Visit: Payer: No Typology Code available for payment source | Admitting: Physical Therapy

## 2022-08-15 DIAGNOSIS — R2681 Unsteadiness on feet: Secondary | ICD-10-CM

## 2022-08-15 DIAGNOSIS — M6281 Muscle weakness (generalized): Secondary | ICD-10-CM

## 2022-08-15 NOTE — Therapy (Signed)
OUTPATIENT PHYSICAL THERAPY NEURO TREATMENT   Patient Name: Crystal [name] MRN: [id number] DOB:[birth year], 76 y.o., female Today's Date: 08/15/2022   PCP: Gaynelle Arabian, MD REFERRING PROVIDER: [name], MD    PT End of Session - 08/15/22 0804     Visit Number 3    Number of Visits 13   Plus eval   Date for PT Re-Evaluation 09/26/22    Authorization Type [employer] [place]    PT Start Time 0801    PT Stop Time [phone]    PT Time Calculation (min) 43 min    Equipment Utilized During Treatment --    Activity Tolerance Patient limited by pain    Behavior During Therapy [facility] for tasks assessed/performed               Past Medical History:  Diagnosis Date   Anxiety    Atherosclerosis of aorta ([place])    Breast cancer ([place]) 1998   (Rt) lumpectomy dx 1999; Dr. Benay Spice   Dyslipidemia, goal LDL below 130    Essential hypertension    GERD (gastroesophageal reflux disease)    Hypercholesterolemia    IBS (irritable bowel syndrome)    Lymphoma ([place])    lymphoma dx 11/28/10 - left neck   non hodgkins lymphoma 12/2010   right aprotid gland   Nonischemic cardiomyopathy ([place]) 2008   ? Doxorubincin induced; essentially resolved as of echo in January 2014, current EF 50-55%. Grade 1 diastolic dysfunction.   Obesity    Severe obesity (BMI >= 40) ([place]) 05/21/2013   Improve to BMI of 39 by July 2015   Tremor    Past Surgical History:  Procedure Laterality Date   ABDOMINAL HYSTERECTOMY     BSO   ANKLE FRACTURE SURGERY Left    BREAST EXCISIONAL BIOPSY Left    BREAST EXCISIONAL BIOPSY Right    BREAST LUMPECTOMY Right 1998   BREAST LUMPECTOMY Right 08/25/2020   Procedure: RIGHT BREAST LUMPECTOMY;  Surgeon: Coralie Keens, MD;  Location: [place];  Service: General;  Laterality: Right;   CHOLECYSTECTOMY N/A 06/09/2020   Procedure: LAPAROSCOPIC CHOLECYSTECTOMY;  Surgeon: Coralie Keens, MD;  [facility];  Service: General;  Laterality: N/A;    COLONOSCOPY     LEFT HEART CATH AND CORONARY ANGIOGRAPHY  12/2007   None coronary disease, EF 45% (up from nuclear study EF of 30% in June '08)   NM MYOVIEW LTD  03/29/2007   EF 33%,NEGATIVE ISCHEMIA, prob need cath   NM MYOVIEW LTD  05/01/2014   Lexiscan: EF 55%. Normal wall motion; no ischemia or infarction; apical thinning   PORTACATH PLACEMENT  08/25/2013   rt. with tip in cavoatrial junction, Dr.Yamagata    TRANSTHORACIC ECHOCARDIOGRAM  05/01/2014   Normal LV size with low normal function. EF of 50-55% and Gr 1 DD; aortic sclerosis without stenosis. MAC and thickening/calcification of the anterior leaflet - no notable AI / AS or MR/MS   Patient Active Problem List   Diagnosis Date Noted   Tachycardia 08/29/2021   Preop cardiovascular exam 08/29/2020   Symptomatic cholelithiasis 06/09/2020   S/P laparoscopic cholecystectomy 06/09/2020   Chest pain with moderate risk for cardiac etiology 02/14/2018   Right upper quadrant pain 02/12/2018   Tremor 02/11/2014   Non Hodgkin's lymphoma ([place]) 08/18/2013   Neck pain on left side 05/30/2013   Dizziness 05/30/2013   Moderate obesity 05/21/2013   Lung crackles 05/21/2013   Dyspnea on exertion 05/21/2013   Nonischemic cardiomyopathy ([place])  Essential hypertension    Dyslipidemia, goal LDL below 100     ONSET DATE: 08/02/2022   REFERRING DIAG: R26.9 (ICD-10-CM) - Gait difficulty M54.42,M54.41,G89.29 (ICD-10-CM) - Chronic bilateral low back pain with bilateral sciatica   THERAPY DIAG:  Muscle weakness (generalized)  Unsteadiness on feet  Rationale for Evaluation and Treatment Rehabilitation  SUBJECTIVE:                                                                                                                                                                                              SUBJECTIVE STATEMENT: Pt reports she had a lot of pain following last session, requesting to hold her therapy until she has her MRI next week.  Pt w/increase in pain in her legs since last visit, reports her HEP is very challenging and painful.   Pt accompanied by: self  PERTINENT HISTORY: Gait diff; orthostatic tremor; back pain; leg weakness. Hodgkin's Lymphoma complicated by nonischemic cardiomyopathy  PAIN:  Are you having pain? Yes: NPRS scale: 6/10 Pain location: BLEs and low back Pain description: Achy/sharp   PRECAUTIONS: Fall   FALLS: Has patient fallen in last 6 months? Yes. Number of falls 1  PLOF: Independent  PATIENT GOALS "my legs, seeing if I can control the tremors"   OBJECTIVE:   DIAGNOSTIC FINDINGS: MRI scheduled for 10/30    TODAY'S TREATMENT:  Gait Training  Gait pattern: step through pattern, decreased stride length, decreased hip/knee flexion- Right, decreased hip/knee flexion- Left, decreased ankle dorsiflexion- Right, decreased ankle dorsiflexion- Left, poor foot clearance- Right, and poor foot clearance- Left Distance walked: 230' Assistive device utilized: Environmental consultant - 4 wheeled Level of assistance: Modified independence Comments: Pt states she does not feel more supported w/rollator vs no AD   Ther Ex  Seated ball roll outs for improved spinal ROM and low back pain modulation, x10 w/10-15s hold  Supine LTRs, x10 per side. Pt very hypomobile bilaterally but noted most tightness on R side. Added to HEP (see bolded below)   Ther Act Discussed POC moving forward and pt requesting to cancel remainder of appointments this week and will think about cancelling next Tuesday, as she would like to have the MRI results and lab results prior to continuing therapy.     GAIT: Gait pattern: step through pattern, decreased arm swing- Right, decreased arm swing- Left, decreased stride length, decreased hip/knee flexion- Right, decreased hip/knee flexion- Left, decreased ankle dorsiflexion- Right, decreased ankle dorsiflexion- Left, decreased trunk rotation, and wide BOS Distance walked: Various short clinic  distances  Assistive device utilized: None Level of assistance: SBA Comments: Pt w/instability upon initiation of gait,  w/very wide BOS and abducted BUEs, but after a few steps is able to stabilize and reduce BOS   PATIENT EDUCATION: Education details: Updates to HEP, POC  Person educated: Patient Education method: Explanation, Demonstration, and Handouts Education comprehension: verbalized understanding   HOME EXERCISE PROGRAM: Access Code: [email or web address] URL: [email or web address] Date: 08/11/2022 Prepared by: Mickie Bail Jaquon Gingerich  Exercises - Sit to Stand Without Arm Support  - 1 x daily - 7 x weekly - 3 sets - 10 reps - Standing marches on pillow   - 1 x daily - 7 x weekly - 3 sets - 10 reps - Posterior pelvic tilt  - 1 x daily - 7 x weekly - 3 sets - 10 reps - Lower Trunk Rotation Stretch  - 1 x daily - 7 x weekly - 3 sets - 10 reps    GOALS: Goals reviewed with patient? Yes  SHORT TERM GOALS: Target date: 08/29/2022  Pt will be independent with initial HEP for improved strength, balance, transfers and gait.  Baseline: Goal status: INITIAL  2.  Berg to be performed and STG/LTG written Baseline:  Goal status: INITIAL  3.  Pt will improve gait velocity to at least 3.108f/s w/LRAD for improved gait efficiency   Baseline: 2.61 ft/s without AD Goal status: INITIAL  4.  Pt will improve 5 x STS to less than or equal to 20 seconds w/BUE support to demonstrate improved functional strength and transfer efficiency.   Baseline:  Goal status: INITIAL  5.  MCTSIB to be performed and LTG written  Baseline:  Goal status: MET   LONG TERM GOALS: Target date: 09/19/2022  Pt will be independent with final HEP for improved strength, balance, transfers and gait.  Baseline:  Goal status: INITIAL  2.  Pt will improve gait velocity to at least 3.4 ft/s w/LRAD for improved gait efficiency   Baseline: 2.627fs without AD Goal status: INITIAL  3.  Berg goal  Baseline:   Goal status: INITIAL  4.  Pt will hold condition 4 on MCTSIB for >/= 12 seconds for improved vestibular input  Baseline: 5.27s  Goal status: REVISED  5.  Pt will ambulate 250' on level and unlevel surfaces w/LRAD and < 5/10 low back pain for improved functional mobility and independence  Baseline:  Goal status: INITIAL   ASSESSMENT:  CLINICAL IMPRESSION: Session limited due to pt's high pain levels and fear of participating in therapy until having MRI on Monday (10/30). Delayed balance testing today as pt states she will be unable to perform standing tasks. Practiced use of rollator to determine if it will help pt's low back pain/BLE tremors, but pt reported no difference between using rollator vs no AD. Cancelled pt's second appointment this week due to pt request. Continue POC.     OBJECTIVE IMPAIRMENTS Abnormal gait, decreased activity tolerance, decreased balance, decreased endurance, decreased knowledge of condition, decreased mobility, difficulty walking, decreased strength, increased muscle spasms, and pain.   ACTIVITY LIMITATIONS carrying, lifting, bending, standing, squatting, stairs, transfers, reach over head, locomotion level, and caring for others  PARTICIPATION LIMITATIONS: meal prep, cleaning, laundry, shopping, community activity, yard work, and church  PERSONAL FACTORS Age, Fitness, Past/current experiences, Time since onset of injury/illness/exacerbation, and 1 comorbidity: lack of support at home  are also affecting patient's functional outcome.   REHAB POTENTIAL: Good  CLINICAL DECISION MAKING: Stable/uncomplicated  EVALUATION COMPLEXITY: Low  PLAN: PT FREQUENCY: 2x/week  PT DURATION: 6 weeks  PLANNED INTERVENTIONS: Therapeutic exercises, Therapeutic activity, Neuromuscular re-education, Balance training, Gait  training, Patient/Family education, Self Care, Joint mobilization, Stair training, Vestibular training, Canalith repositioning, DME instructions,  Aquatic Therapy, Dry Needling, Manual therapy, and Re-evaluation  PLAN FOR NEXT SESSION: Merrilee Jansky, add to HEP for R quad strength, unlevel surfaces and vestibular input, global strengthening, endurance, low back exercise    Malek Skog E Marielle Mantione, PT, DPT 08/15/2022, 8:45 AM

## 2022-08-17 ENCOUNTER — Telehealth: Payer: Self-pay | Admitting: Diagnostic Neuroimaging

## 2022-08-17 NOTE — Telephone Encounter (Signed)
Pt called wanting to know when she will be getting her lab results back. Please advise.

## 2022-08-18 ENCOUNTER — Ambulatory Visit: Payer: No Typology Code available for payment source | Admitting: Physical Therapy

## 2022-08-18 NOTE — Telephone Encounter (Signed)
Called pt and advised her that two labs are not resulted but other 3 are normal. Advised will call her when others are ready. Patient verbalized understanding, appreciation.

## 2022-08-19 LAB — ALDOLASE: Aldolase: 5.5 U/L (ref 3.3–10.3)

## 2022-08-19 LAB — CK: Total CK: 123 U/L (ref 32–182)

## 2022-08-19 LAB — MUSK ANTIBODIES: MuSK Antibodies: <1 U/mL

## 2022-08-19 LAB — VITAMIN B12: Vitamin B-12: 339 pg/mL (ref 232–1245)

## 2022-08-19 LAB — ACHR ABS WITH REFLEX TO MUSK: AChR Binding Ab, Serum: 0.03 nmol/L (ref 0.00–0.24)

## 2022-08-21 ENCOUNTER — Telehealth: Payer: Self-pay

## 2022-08-21 ENCOUNTER — Telehealth: Payer: Self-pay | Admitting: Diagnostic Neuroimaging

## 2022-08-21 ENCOUNTER — Ambulatory Visit
Admission: RE | Admit: 2022-08-21 | Discharge: 2022-08-21 | Disposition: A | Discharge status: Home / Self Care | Payer: No Typology Code available for payment source | Source: Ambulatory Visit | Attending: Diagnostic Neuroimaging | Admitting: Diagnostic Neuroimaging

## 2022-08-21 DIAGNOSIS — R29898 Other symptoms and signs involving the musculoskeletal system: Secondary | ICD-10-CM | POA: Diagnosis not present

## 2022-08-21 DIAGNOSIS — R269 Unspecified abnormalities of gait and mobility: Secondary | ICD-10-CM | POA: Diagnosis not present

## 2022-08-21 DIAGNOSIS — M545 Low back pain, unspecified: Secondary | ICD-10-CM | POA: Diagnosis not present

## 2022-08-21 DIAGNOSIS — G8929 Other chronic pain: Secondary | ICD-10-CM

## 2022-08-21 DIAGNOSIS — M5442 Lumbago with sciatica, left side: Secondary | ICD-10-CM | POA: Diagnosis not present

## 2022-08-21 NOTE — Telephone Encounter (Signed)
Called patient who stated she goes to [facility], PT makes her legs ache, especially at night. She's had 4 sessions, cost $15 each time. [facility] [employer] is free because she's a member. I advised her PT can give her specific exercises for her issues to improve them. MD would advise she continue if possible. She does not want to continue with PT, wants MD to be aware.  She stated her MRI is Thurs, wants those results when ready. I advised she'll get a call when they are ready, may be 2--3 days. Patient verbalized understanding, appreciation.

## 2022-08-21 NOTE — Telephone Encounter (Signed)
Pt is calling. Said can a nurse asked Dr. Leta Baptist if she can stop going to therapy. Pt is requesting a call back from nurse.

## 2022-08-21 NOTE — Telephone Encounter (Deleted)
-----   Message from [name], MD sent at 08/20/2022  4:26 PM EDT ----- Unremarkable labs. Continue current plan. Please call patient. -VRP

## 2022-08-21 NOTE — Telephone Encounter (Signed)
error 

## 2022-08-22 ENCOUNTER — Ambulatory Visit: Payer: No Typology Code available for payment source

## 2022-08-23 DIAGNOSIS — I619 Nontraumatic intracerebral hemorrhage, unspecified: Secondary | ICD-10-CM

## 2022-08-23 HISTORY — DX: Nontraumatic intracerebral hemorrhage, unspecified: I61.9

## 2022-08-24 ENCOUNTER — Telehealth: Payer: Self-pay | Admitting: Diagnostic Neuroimaging

## 2022-08-24 DIAGNOSIS — M48062 Spinal stenosis, lumbar region with neurogenic claudication: Secondary | ICD-10-CM

## 2022-08-24 DIAGNOSIS — S32028A Other fracture of second lumbar vertebra, initial encounter for closed fracture: Secondary | ICD-10-CM

## 2022-08-24 NOTE — Telephone Encounter (Signed)
Urgent referral sent to Dr. Reatha Armour @ [facility], phone # [phone].

## 2022-08-24 NOTE — Telephone Encounter (Signed)
I called patient with MRI results. Multi-level spinal stenosis. Also with L2 vertebral body issue (likely degenerative / osteoporotic compression fracture; underlying pathologic fracture possible but less likely).  Will refer patient to see neurosurgery spine consult (patient requesting Dr. Reatha Armour).  Also recommend follow-up with oncologist (Dr. Learta Codding) for follow-up of this bone lesion (given history of breast CA and NHL).    Orders Placed This Encounter  Procedures   Ambulatory referral to Neurosurgery     [name], MD 48/12/5073, 73:22 AM Certified in Neurology, Neurophysiology and Neuroimaging  [facility] [address], [place], [place] [address] [phone]

## 2022-08-25 ENCOUNTER — Ambulatory Visit: Payer: No Typology Code available for payment source | Admitting: Physical Therapy

## 2022-08-28 ENCOUNTER — Telehealth: Payer: Self-pay

## 2022-08-28 NOTE — Telephone Encounter (Signed)
TC from Mrs. Bittinger asking for Dr. Benay Spice to look at her CT scan. I let her Dr. Benay Spice is aware she had a Ct scan and she needs to follow up with Dr. Leta Baptist.

## 2022-08-29 ENCOUNTER — Ambulatory Visit: Payer: No Typology Code available for payment source

## 2022-08-30 ENCOUNTER — Telehealth: Payer: Self-pay | Admitting: *Deleted

## 2022-08-30 DIAGNOSIS — Z6836 Body mass index (BMI) 36.0-36.9, adult: Secondary | ICD-10-CM | POA: Diagnosis not present

## 2022-08-30 DIAGNOSIS — D492 Neoplasm of unspecified behavior of bone, soft tissue, and skin: Secondary | ICD-10-CM | POA: Diagnosis not present

## 2022-08-30 NOTE — Telephone Encounter (Signed)
Called to report "suspicious" T1 lesion on non-contrast MRI. Neuro is ordering another MRI with contrast. Suggested she call us when the MRI is scheduled and we can look for results to determine if return to Dr. Benay Spice is needed. She agrees to this plan.

## 2022-08-31 ENCOUNTER — Ambulatory Visit: Payer: No Typology Code available for payment source

## 2022-09-01 ENCOUNTER — Other Ambulatory Visit: Payer: Self-pay | Admitting: Neurological Surgery

## 2022-09-01 DIAGNOSIS — D492 Neoplasm of unspecified behavior of bone, soft tissue, and skin: Secondary | ICD-10-CM

## 2022-09-03 ENCOUNTER — Ambulatory Visit
Admission: RE | Admit: 2022-09-03 | Discharge: 2022-09-03 | Disposition: A | Discharge status: Home / Self Care | Payer: No Typology Code available for payment source | Source: Ambulatory Visit | Attending: Neurological Surgery | Admitting: Neurological Surgery

## 2022-09-03 DIAGNOSIS — D492 Neoplasm of unspecified behavior of bone, soft tissue, and skin: Secondary | ICD-10-CM

## 2022-09-03 MED ORDER — GADOPICLENOL 0.5 MMOL/ML IV SOLN
7.5000 mL | Freq: Once | INTRAVENOUS | Status: AC | PRN
  Administered 2022-09-03: 7.5 mL via INTRAVENOUS

## 2022-09-04 ENCOUNTER — Encounter: Payer: Self-pay | Admitting: Cardiology

## 2022-09-04 ENCOUNTER — Ambulatory Visit: Payer: No Typology Code available for payment source | Attending: Cardiology | Admitting: Cardiology

## 2022-09-04 VITALS — BP 118/82 | HR 77 | Resp 97 | Ht 65.0 in | Wt 219.8 lb

## 2022-09-04 DIAGNOSIS — R000 Tachycardia, unspecified: Secondary | ICD-10-CM | POA: Diagnosis not present

## 2022-09-04 DIAGNOSIS — R0609 Other forms of dyspnea: Secondary | ICD-10-CM | POA: Diagnosis not present

## 2022-09-04 DIAGNOSIS — E785 Hyperlipidemia, unspecified: Secondary | ICD-10-CM

## 2022-09-04 DIAGNOSIS — E668 Other obesity: Secondary | ICD-10-CM | POA: Diagnosis not present

## 2022-09-04 DIAGNOSIS — I10 Essential (primary) hypertension: Secondary | ICD-10-CM | POA: Diagnosis not present

## 2022-09-04 DIAGNOSIS — I428 Other cardiomyopathies: Secondary | ICD-10-CM | POA: Diagnosis not present

## 2022-09-04 DIAGNOSIS — E669 Obesity, unspecified: Secondary | ICD-10-CM

## 2022-09-04 NOTE — Patient Instructions (Addendum)
Medication Instructions:  Not needed  *If you need a refill on your cardiac medications before your next appointment, please call your pharmacy*   Lab Work: Cbc Cmp Lipid Tsh [id number] If you have labs (blood work) drawn today and your tests are completely normal, you will receive your results only by: [place] (if you have MyChart) OR A paper copy in the mail If you have any lab test that is abnormal or we need to change your treatment, we will call you to review the results.   Testing/Procedures:  Not needed  Follow-Up: At [facility], you and your health needs are our priority.  As part of our continuing mission to provide you with exceptional heart care, we have created designated Provider Care Teams.  These Care Teams include your primary Cardiologist (physician) and Advanced Practice Providers (APPs -  Physician Assistants and Nurse Practitioners) who all work together to provide you with the care you need, when you need it.     Your next appointment:   12 month(s)  The format for your next appointment:   In Person  Provider:   Glenetta Hew, MD

## 2022-09-04 NOTE — Progress Notes (Signed)
Primary Care Provider: Gaynelle Arabian, [place] Cardiologist: Glenetta Hew, MD Electrophysiologist: None  Clinic Note: Chief Complaint  Patient presents with   Follow-up    Annual follow-up.  Doing okay.  Mostly limited by leg and back pain.   ===================================  ASSESSMENT/PLAN   Problem List Items Addressed This Visit       Cardiology Problems   Nonischemic cardiomyopathy ([place]) (Chronic)    As of her most recent echo, EF is back to 50-55% with no RWMA.  No active heart failure symptoms. Mostly limited by back, leg pain as well as obesity and deconditioning. Euvolemic on exam.  She is back on her ARB-HCTZ combination along with beta-blocker.  No further diuretic besides HCTZ required.      Relevant Orders   EKG 12-Lead (Completed)   Lipid panel (Completed)   Comprehensive metabolic panel (Completed)   CBC (Completed)   TSH (Completed)   Hemoglobin A1c (Completed)   Essential hypertension - Primary (Chronic)    Blood pressure pretty stable.  She is on stable dose of losartan and HCTZ 50-12.5 mg as well as Lopressor (metoprolol tartrate) 50 mg twice daily No change.  Check CMP to assess renal function      Relevant Orders   Comprehensive metabolic panel (Completed)   CBC (Completed)   TSH (Completed)   Dyslipidemia, goal LDL below 100 (Chronic)    Not currently on statin-related to myalgias. Labs checked today showed LDL 120 which is up from last visit.  Low threshold to consider treating, however she has lots of issues ongoing with her back and hip pain and concerns for recurrent cancers.  I think there is more important things to treat right now the lipids.  A1c also checked today was 5.6.      Relevant Orders   EKG 12-Lead (Completed)   Lipid panel (Completed)   Comprehensive metabolic panel (Completed)   CBC (Completed)   TSH (Completed)   Hemoglobin A1c (Completed)     Other   Moderate obesity (Chronic)     Unfortunately difficult for her to exercise.  Discussed dietary modification, however with concern for recurrent cancer is a possibility, I will be reluctant to adjust her diet for fear of potentially leading to some malnutrition.      Relevant Orders   Lipid panel (Completed)   Comprehensive metabolic panel (Completed)   CBC (Completed)   TSH (Completed)   Hemoglobin A1c (Completed)   Dyspnea on exertion (Chronic)    Clearly multifactorial.  Error exercise balance very much limited by her musculoskeletal pains.  Hopefully with physical therapy she can regain some of her strength.      Relevant Orders   Comprehensive metabolic panel (Completed)   CBC (Completed)   TSH (Completed)   Tachycardia    Well-controlled on current dose of beta-blocker.      Relevant Orders   EKG 12-Lead (Completed)   Comprehensive metabolic panel (Completed)   CBC (Completed)   TSH (Completed)    ===================================  HPI:    Crystal [name] is a 76 y.o. female with a PMH notable for Hodgkin's Lymphoma complicated by Nonischemic Cardiomyopathy status post Right Breast Lobectomy for Breat CA who presents today for annual follow-up at the request of Gaynelle Arabian, MD.  KACEE SUKHU was last seen on August 29, 2021.  Was euvolemic.  BP med changes made.  Labs checked.  Slowly losing weight.  Stable multifactorial dyspnea exertion.  Recent Hospitalizations: None  Reviewed  CV  studies:    The following studies were reviewed today: (if available, images/films reviewed: From Epic Chart or Care Everywhere) None:  06/27/22: 3D breast mammogram.  No suspicious disease..  Interval History:   Crystal [name] returns here today overall doing fairly well from a cardiac standpoint.  Mostly limited by back & Leg pain > DOE.  Recent Lumbar Spine MRI - show spinal stenosis - had contrast MRI yesterday (not read) - ? Concern for potential MET.  She has leg tremors that are now probably  explained by the spinal stenosis seen on MRI.  She really does not do a lot of walking because of her back and radicular leg pain.  She is not noting any significant dyspnea with exception of just discomfort from walking which very much limits her walking ability.  Otherwise she is doing significant exertional dyspnea, chest pain pressure with rest or exertion.  No PND, orthopnea or edema.  CV Review of Symptoms (Summary): Cardiovascular ROS: positive for - dyspnea on exertion and bu tmore related to deconditioning. negative for - chest pain, edema, irregular heartbeat, orthopnea, palpitations, paroxysmal nocturnal dyspnea, rapid heart rate, shortness of breath, or lightheadedness, syncope/near syncope, TIA/amaurosis fugax, -- not walking enough for claudication  REVIEWED OF SYSTEMS   Review of Systems  Constitutional:  Positive for malaise/fatigue (Mostly was deconditioning and back pain.). Negative for weight loss (More sedentary now because of back and leg pain and back weight.).  HENT:  Negative for congestion and sinus pain.   Gastrointestinal:  Negative for abdominal pain, blood in stool, diarrhea (Loose stools from cholecystectomy) and melena.  Genitourinary:  Negative for hematuria.  Musculoskeletal:  Positive for back pain (With bilateral leg pain and radiculopathy.).  Neurological:  Positive for tremors (In her legs) and weakness (Legs get weak). Negative for dizziness (More balance issues).  Psychiatric/Behavioral:  Negative for memory loss. The patient is not nervous/anxious and does not have insomnia.    I have reviewed and (if needed) personally updated the patient's problem list, medications, allergies, past medical and surgical history, social and family history.   PAST MEDICAL HISTORY   Past Medical History:  Diagnosis Date   Anxiety    Atherosclerosis of aorta ([place])    Breast cancer ([place]) 1998   (Rt) lumpectomy dx 1999; Dr. Benay Spice   Dyslipidemia, goal LDL below 130     Essential hypertension    GERD (gastroesophageal reflux disease)    Hypercholesterolemia    IBS (irritable bowel syndrome)    Lymphoma ([place])    lymphoma dx 11/28/10 - left neck   non hodgkins lymphoma 12/2010   right aprotid gland   Nonischemic cardiomyopathy ([place]) 2008   ? Doxorubincin induced; essentially resolved as of echo in January 2014, current EF 50-55%. Grade 1 diastolic dysfunction.   Obesity    Severe obesity (BMI >= 40) ([place]) 05/21/2013   Improve to BMI of 39 by July 2015   Tremor     PAST SURGICAL HISTORY   Past Surgical History:  Procedure Laterality Date   ABDOMINAL HYSTERECTOMY     BSO   ANKLE FRACTURE SURGERY Left    BREAST EXCISIONAL BIOPSY Left    BREAST EXCISIONAL BIOPSY Right    BREAST LUMPECTOMY Right 1998   BREAST LUMPECTOMY Right 08/25/2020   Procedure: RIGHT BREAST LUMPECTOMY;  Surgeon: Coralie Keens, MD;  Location: [facility];  Service: General;  Laterality: Right;   CHOLECYSTECTOMY N/A 06/09/2020   Procedure: LAPAROSCOPIC CHOLECYSTECTOMY;  Surgeon: Coralie Keens, MD;  Location: [facility];  Service: General;  Laterality: N/A;   COLONOSCOPY     LEFT HEART CATH AND CORONARY ANGIOGRAPHY  12/2007   None coronary disease, EF 45% (up from nuclear study EF of 30% in June '08)   NM MYOVIEW LTD  03/29/2007   EF 33%,NEGATIVE ISCHEMIA, prob need cath   NM MYOVIEW LTD  05/01/2014   Lexiscan: EF 55%. Normal wall motion; no ischemia or infarction; apical thinning   PORTACATH PLACEMENT  08/25/2013   rt. with tip in cavoatrial junction, Dr.Yamagata    TRANSTHORACIC ECHOCARDIOGRAM  05/01/2014   Normal LV size with low normal function. EF of 50-55% and Gr 1 DD; aortic sclerosis without stenosis. MAC and thickening/calcification of the anterior leaflet - no notable AI / AS or MR/MS    Immunization History  Administered Date(s) Administered   Influenza Split 07/10/2011, 07/13/2018   Influenza,inj,Quad PF,6+ Mos 06/30/2014, 07/17/2016    Influenza-Unspecified 07/24/2019   Pneumococcal Conjugate-13 09/08/2011   Pneumococcal Polysaccharide-23 05/11/2015    MEDICATIONS/ALLERGIES   Current Meds  Medication Sig   aspirin EC 81 MG tablet Take 81 mg by mouth every morning.   ibuprofen (ADVIL) 100 MG tablet Take 100 mg by mouth every 6 (six) hours as needed for fever.   LORazepam (ATIVAN) 0.5 MG tablet Take 0.5 mg by mouth 2 (two) times daily as needed.   losartan-hydrochlorothiazide (HYZAAR) 50-12.5 MG tablet Take 1 tablet by mouth daily.   metoprolol tartrate (LOPRESSOR) 50 MG tablet Take 50 mg by mouth 2 (two) times daily.    Allergies  Allergen Reactions   Simvastatin     Leg cramps   Zoloft [Sertraline] Nausea Only   Codeine Other (See Comments)    Bad Headaches   Coreg Other (See Comments)    "Made my legs hurt"   Lisinopril Cough   Tizanidine Hcl Rash    hypotension    SOCIAL HISTORY/FAMILY HISTORY   Reviewed in Epic:  Pertinent findings:  Social History   Tobacco Use   Smoking status: Never   Smokeless tobacco: Never  Vaping Use   Vaping Use: Never used  Substance Use Topics   Alcohol use: Yes    Comment: social    Drug use: No   Social History   Social History Narrative   She is a widow mother of 2, with 1 child is deceased. She has 2 grandchildren. She is trying to get some walking in town and can do about 20-25 minutes his another 30 minutes she pushes a more night gets short of breath.   She is at retired [employer] employee, he simply laid off after 45 years.   She usually comes accompanied by her sister, who is also my patient.   She never was a smoker, and only occasionally has a social alcoholic beverage.    OBJCTIVE -PE, EKG, labs   Wt Readings from Last 3 Encounters:  09/29/22 219 lb (99.3 kg)  09/19/22 222 lb 6.4 oz (100.9 kg)  09/13/22 218 lb 4.1 oz (99 kg)    Physical Exam: BP 118/82 (BP Location: Left Arm, Patient Position: Sitting, Cuff Size: Normal)   Pulse 77   Resp  (!) 97   Ht 5' 5" (1.651 m)   Wt 219 lb 12.8 oz (99.7 kg)   BMI 36.58 kg/m  Physical Exam Vitals reviewed.  Constitutional:      General: She is not in acute distress.    Appearance: Normal appearance. She is obese. She is not ill-appearing   or toxic-appearing.  HENT:     Head: Normocephalic and atraumatic.  Neck:     Vascular: No carotid bruit.  Cardiovascular:     Rate and Rhythm: Normal rate and regular rhythm.     Pulses: Normal pulses.     Heart sounds: S1 normal and S2 normal. Heart sounds are distant. No murmur heard.    No friction rub. No gallop.  Pulmonary:     Effort: Pulmonary effort is normal. No respiratory distress.     Breath sounds: Normal breath sounds. No wheezing, rhonchi or rales.  Chest:     Chest wall: No tenderness.  Musculoskeletal:        General: Swelling (Trivial, puffy) present. Normal range of motion.     Cervical back: Normal range of motion and neck supple.  Skin:    General: Skin is warm and dry.     Comments: Bilateral ankle spider veins  Neurological:     General: No focal deficit present.     Mental Status: She is alert and oriented to person, place, and time. Mental status is at baseline.     Gait: Gait abnormal.  Psychiatric:        Mood and Affect: Mood normal.        Behavior: Behavior normal.        Thought Content: Thought content normal.        Judgment: Judgment normal.    Adult ECG Report  Rate: 77 ;  Rhythm: normal sinus rhythm and incomplete right bundle branch block with widened QRS.  Nonspecific ST-T wave changes. ;   Narrative Interpretation: QRS complex is slightly wider otherwise normal/stable  Recent Labs: Reviewed.  Due for labs now. Lab Results  Component Value Date   CHOL 181 09/04/2022   HDL 39 (L) 09/04/2022   LDLCALC 120 (H) 09/04/2022   TRIG 121 09/04/2022   CHOLHDL 4.6 (H) 09/04/2022   Lab Results  Component Value Date   CREATININE 1.32 (H) 09/14/2022   BUN 20 09/14/2022   NA 140 09/14/2022   K 3.7  09/14/2022   CL 109 09/14/2022   CO2 26 09/14/2022      Latest Ref Rng & Units 09/14/2022    3:59 AM 09/13/2022    1:21 PM 09/04/2022    9:06 AM  CBC  WBC 4.0 - 10.5 K/uL 7.0  8.6  4.8   Hemoglobin 12.0 - 15.0 g/dL 13.0  14.1  13.5   Hematocrit 36.0 - 46.0 % 40.9  44.2  40.8   Platelets 150 - 400 K/uL 198  209  250     Lab Results  Component Value Date   HGBA1C 5.6 09/04/2022   Lab Results  Component Value Date   TSH 2.580 09/04/2022    ================================================== I spent a total of 22 minutes with the patient spent in direct patient consultation.  Additional time spent with chart review  / charting (studies, outside notes, etc): 15 min Total Time: 37 min  Current medicines are reviewed at length with the patient today.  (+/- concerns) N/A  Notice: This dictation was prepared with Dragon dictation along with smart phrase technology. Any transcriptional errors that result from this process are unintentional and may not be corrected upon review.  Studies Ordered:   Orders Placed This Encounter  Procedures   Lipid panel   Comprehensive metabolic panel   CBC   TSH   Hemoglobin A1c   EKG 12-Lead   No orders of the defined types were   placed in this encounter.   Patient Instructions / Medication Changes & Studies & Tests Ordered   Patient Instructions  Medication Instructions:  Not needed  *If you need a refill on your cardiac medications before your next appointment, please call your pharmacy*   Lab Work: Cbc Cmp Lipid Tsh [id number] If you have labs (blood work) drawn today and your tests are completely normal, you will receive your results only by: MyChart Message (if you have MyChart) OR A paper copy in the mail If you have any lab test that is abnormal or we need to change your treatment, we will call you to review the results.   Testing/Procedures:  Not needed  Follow-Up: At [facility], you and your health needs are our  priority.  As part of our continuing mission to provide you with exceptional heart care, we have created designated Provider Care Teams.  These Care Teams include your primary Cardiologist (physician) and Advanced Practice Providers (APPs -  Physician Assistants and Nurse Practitioners) who all work together to provide you with the care you need, when you need it.     Your next appointment:   12 month(s)  The format for your next appointment:   In Person  Provider:   David Harding, MD         David W. Harding, MD, MS David Harding, M.D., M.S. Interventional Cardiologist  [facility]  Pager # [phone] Phone # [phone] [address] [place], [place] [address]   Thank you for choosing [facility]!!     

## 2022-09-05 ENCOUNTER — Ambulatory Visit: Payer: No Typology Code available for payment source

## 2022-09-05 LAB — COMPREHENSIVE METABOLIC PANEL
ALT: 10 IU/L (ref 0–32)
AST: 18 IU/L (ref 0–40)
Albumin/Globulin Ratio: 1.2 (ref 1.2–2.2)
Albumin: 3.5 g/dL — ABNORMAL LOW (ref 3.8–4.8)
Alkaline Phosphatase: 82 IU/L (ref 44–121)
BUN/Creatinine Ratio: 17 (ref 12–28)
BUN: 24 mg/dL (ref 8–27)
Bilirubin Total: 0.3 mg/dL (ref 0.0–1.2)
CO2: 24 mmol/L (ref 20–29)
Calcium: 9.6 mg/dL (ref 8.7–10.3)
Chloride: 106 mmol/L (ref 96–106)
Creatinine, Ser: 1.39 mg/dL — ABNORMAL HIGH (ref 0.57–1.00)
Globulin, Total: 2.9 g/dL (ref 1.5–4.5)
Glucose: 92 mg/dL (ref 70–99)
Potassium: 3.6 mmol/L (ref 3.5–5.2)
Sodium: 143 mmol/L (ref 134–144)
Total Protein: 6.4 g/dL (ref 6.0–8.5)
eGFR: 39 mL/min/{1.73_m2} — ABNORMAL LOW (ref >59)

## 2022-09-05 LAB — LIPID PANEL
Chol/HDL Ratio: 4.6 ratio — ABNORMAL HIGH (ref 0.0–4.4)
Cholesterol, Total: 181 mg/dL (ref 100–199)
HDL: 39 mg/dL — ABNORMAL LOW (ref >39)
LDL Chol Calc (NIH): 120 mg/dL — ABNORMAL HIGH (ref 0–99)
Triglycerides: 121 mg/dL (ref 0–149)
VLDL Cholesterol Cal: 22 mg/dL (ref 5–40)

## 2022-09-05 LAB — CBC
Hematocrit: 40.8 % (ref 34.0–46.6)
Hemoglobin: 13.5 g/dL (ref 11.1–15.9)
MCH: 30.1 pg (ref 26.6–33.0)
MCHC: 33.1 g/dL (ref 31.5–35.7)
MCV: 91 fL (ref 79–97)
Platelets: 250 10*3/uL (ref 150–450)
RBC: 4.49 x10E6/uL (ref 3.77–5.28)
RDW: 13.4 % (ref 11.7–15.4)
WBC: 4.8 10*3/uL (ref 3.4–10.8)

## 2022-09-05 LAB — TSH: TSH: 2.58 u[IU]/mL (ref 0.450–4.500)

## 2022-09-05 LAB — HEMOGLOBIN A1C
Est. average glucose Bld gHb Est-mCnc: 114 mg/dL
Hgb A1c MFr Bld: 5.6 % (ref 4.8–5.6)

## 2022-09-06 ENCOUNTER — Telehealth: Payer: Self-pay | Admitting: *Deleted

## 2022-09-06 NOTE — Telephone Encounter (Signed)
Per Dr. Benay Spice: will see her on 11/16 at 0900. Mrs. Rosene notified and agrees.

## 2022-09-07 ENCOUNTER — Inpatient Hospital Stay: Payer: No Typology Code available for payment source | Attending: Oncology | Admitting: Oncology

## 2022-09-07 VITALS — BP 141/82 | HR 84 | Temp 98.2°F | Resp 18 | Ht 65.0 in | Wt 220.0 lb

## 2022-09-07 DIAGNOSIS — M899 Disorder of bone, unspecified: Secondary | ICD-10-CM | POA: Diagnosis not present

## 2022-09-07 DIAGNOSIS — M545 Low back pain, unspecified: Secondary | ICD-10-CM | POA: Diagnosis not present

## 2022-09-07 DIAGNOSIS — J90 Pleural effusion, not elsewhere classified: Secondary | ICD-10-CM | POA: Diagnosis not present

## 2022-09-07 DIAGNOSIS — R918 Other nonspecific abnormal finding of lung field: Secondary | ICD-10-CM | POA: Insufficient documentation

## 2022-09-07 DIAGNOSIS — Z8572 Personal history of non-Hodgkin lymphomas: Secondary | ICD-10-CM | POA: Diagnosis not present

## 2022-09-07 DIAGNOSIS — C8599 Non-Hodgkin lymphoma, unspecified, extranodal and solid organ sites: Secondary | ICD-10-CM

## 2022-09-07 DIAGNOSIS — Z853 Personal history of malignant neoplasm of breast: Secondary | ICD-10-CM | POA: Diagnosis not present

## 2022-09-07 MED ORDER — LORAZEPAM 0.5 MG PO TABS: 0.5000 mg | ORAL_TABLET | Freq: Two times a day (BID) | ORAL | 0 refills | Status: DC | PRN

## 2022-09-07 NOTE — Progress Notes (Signed)
[facility] OFFICE PROGRESS NOTE   Diagnosis: Breast cancer, non-Hodgkin's lymphoma  INTERVAL HISTORY:   Ms. Milbourn was last seen at the cancer center 2 years ago.  She reports low back pain for the past month.  She chronic tremors in her legs.  This has progressed and she reports difficulty with walking.  She saw neurology 08/02/2022 And MRI of the lumbar spine revealed an abnormal L2 body lesion with 25% loss of height.  The lesion was felt to represent degenerative/osteoporotic compression fracture, but a pathologic fracture is possible.  Severe spinal stenosis is noted at L4-5 with moderate to severe stenosis at L2-3, L3-4, and L5-S1.  She was referred to Dr. Reatha Armour.  An MRI of the lumbar spine with and without contrast on 09/03/2022 revealed an enhancing T1 hypointense and STIR hyperintense lesion replacing much of the L2 vertebral body extending into the pedicles consistent with metastatic disease.  A small amount of extraosseous soft tissue tumor was noted.  Mild compression deformity of the superior L2 endplate consistent with a pathologic fracture.  No other suspicious lesion.  She otherwise feels well. Objective:  Vital signs in last 24 hours:  Blood pressure (!) 141/82, pulse 84, temperature 98.2 F (36.8 C), temperature source Oral, resp. rate 18, height 5\' 5"  (1.651 m), weight 220 lb (99.8 kg), SpO2 98 %.    HEENT: Neck without mass Lymphatics: No cervical, supraclavicular, axillary, or inguinal nodes Resp: End inspiratory rhonchi at the posterior bases bilaterally, no respiratory distress Cardio: Regular rate and rhythm GI: No hepatosplenomegaly, no mass, nontender Vascular: No leg edema Neuro: The motor exam appears intact in the upper and lower extremities bilaterally Breast: No mass in either breast, firm tissue surrounding the right upper breast scar   Lab Results:  Lab Results  Component Value Date   WBC 4.8 09/04/2022   HGB 13.5 09/04/2022    HCT 40.8 09/04/2022   MCV 91 09/04/2022   PLT 250 09/04/2022   NEUTROABS 1.9 06/30/2014    CMP  Lab Results  Component Value Date   NA 143 09/04/2022   K 3.6 09/04/2022   CL 106 09/04/2022   CO2 24 09/04/2022   GLUCOSE 92 09/04/2022   BUN 24 09/04/2022   CREATININE 1.39 (H) 09/04/2022   CALCIUM 9.6 09/04/2022   PROT 6.4 09/04/2022   ALBUMIN 3.5 (L) 09/04/2022   AST 18 09/04/2022   ALT 10 09/04/2022   ALKPHOS 82 09/04/2022   BILITOT 0.3 09/04/2022   GFRNONAA 46 (L) 08/19/2020   GFRAA 48 (L) 06/01/2020   Imaging:  MR Lumbar Spine W Wo Contrast  Result Date: 09/05/2022 CLINICAL DATA:  Back pain, weakness, metastatic disease. EXAM: MRI LUMBAR SPINE WITHOUT AND WITH CONTRAST TECHNIQUE: Multiplanar and multiecho pulse sequences of the lumbar spine were obtained without and with intravenous contrast. CONTRAST:  7.5 cc Vueway COMPARISON:  Lumbar spine MRI 08/21/2022 FINDINGS: Segmentation: Standard; the lowest formed disc space is designated L5-S1. Alignment:  Normal. Vertebrae: Vertebral body heights are preserved. Background marrow signal is normal. There is enhancing T1 hypointensity and STIR hyperintensity replacing much of the L2 vertebral body extending into the pedicles, left more than right, consistent with metastatic disease. There is a small amount of extraosseous soft tissue tumor extending inferiorly from the pedicle (8-10, 7-11). This may contact and irritate the left L2 nerve root. There is mild compression deformity of the superior L2 endplate consistent with pathologic fracture. This fracture is of indeterminate chronicity given background STIR hyperintensity in  the vertebral body. There is no other suspicious marrow signal abnormality. There is no other evidence of fracture. Conus medullaris and cauda equina: Conus extends to the L1 level. Conus and cauda equina appear normal. There is no abnormal enhancement of the conus or cauda equina nerve roots. Paraspinal and other soft  tissues: Unremarkable. Disc levels: There is multilevel disc desiccation and narrowing, most advanced at T11-T12 and L4-L5. There is congenital narrowing of the lumbar canal with superimposed degenerative changes below. T12-L1: Small central protrusion and moderate facet arthropathy without significant spinal canal or neural foraminal stenosis L1-L2: Moderate bilateral facet arthropathy superimposed on congenital canal narrowing resulting in moderate spinal canal stenosis and no significant neural foraminal stenosis L2-L3: There is a disc bulge, degenerative endplate change, and moderate bilateral facet arthropathy superimposed on congenital canal narrowing resulting in severe spinal canal stenosis and no significant neural foraminal stenosis L3-L4: There is a disc bulge, degenerative endplate change, and moderate bilateral facet arthropathy resulting in moderate to severe spinal canal stenosis and moderate right worse than left neural foraminal stenosis L4-L5: There is a disc bulge eccentric to the left, degenerative endplate change, and advanced bilateral facet arthropathy with ligamentum flavum thickening resulting in severe spinal canal stenosis and severe left and moderate right neural foraminal stenosis. L5-S1: There is a disc bulge and advanced bilateral facet arthropathy with a trace right effusion and degenerative/reactive marrow edema in the right posterior elements, and ligamentum flavum thickening resulting in moderate to severe spinal canal stenosis with left subarticular zone effacement and moderate bilateral neural foraminal stenosis. IMPRESSION: 1. Enhancing signal abnormality in the L2 vertebral body extending into the posterior elements consistent with osseous metastatic disease with an age-indeterminate mild pathologic compression fracture of the superior endplate. There is a small amount of extraosseous soft tissue tumor extending inferiorly from the left pedicle which may irritate the L2 nerve  root. No other suspicious marrow signal abnormality. 2. Advanced multilevel degenerative changes superimposed on congenital canal narrowing described above resulting in moderate to severe spinal canal stenosis at L3-L4 and L5-S1, and severe spinal canal stenosis at L2-L3 and L4-L5. Up to severe left and moderate right neural foraminal stenosis at L4-L5. 3. Degenerative/reactive marrow edema in the right posterior elements at L5-S1 which may reflect a source of pain. Electronically Signed   By: Valetta Mole M.D.   On: 09/05/2022 11:49    Medications: I have reviewed the patient's current medications.   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 Staging CT scans 01/03/2011 confirmed an increased number of small nodes in the neck, left axilla and pelvis without clear evidence of pathologic lymphadenopathy. PET scan 01/11/2011 confirmed hypermetabolic lymph nodes in the right cervical chain, left axillary nodes, periaortic, common iliac, external iliac and inguinal nodes. There was also a possible area of involvement at the right tonsillar region. Palpable left posterior cervical nodes confirmed on exam 05/15/2013- progressive left neck nodes on exam 08/18/2013. Status post cycle 1 bendamustine/Rituxan beginning 08/28/2013. Near-complete resolution of left neck adenopathy on exam 09/12/2013. Status post cycle 2 bendamustine/Rituxan beginning 09/25/2013. CT abdomen/pelvis 10/01/2013-near-complete response to therapy with isolated borderline enlarged left iliac node measuring 1.37 m. Previously identified right peritoneal right pelvic sidewall adenopathy is resolved. Cycle 3 bendamustine/Rituxan beginning 10/28/2013. Cycle 4 bendamustine/Rituxan beginning 11/25/2013. Cycle 5 of bendamustine/Rituxan beginning 12/24/2013. Cycle 6 bendamustine/rituximab 01/27/2014. Stage I right-sided breast cancer diagnosed in  1998. History of congestive heart failure. Hypertension. Port-A-Cath placement 08/25/2013 in interventional  radiology. Removed 04/22/2014. Chills during the Rituxan infusion 08/28/2013. She was given Solu-Medrol. Rituxan was resumed and completed. Abdominal pain following cycle 2 bendamustine/Rituxan-no explanation for the pain on a CT 10/01/2013, resolved after starting Protonix. Tachycardia 12/23/2013. Chest CT showed a pulmonary embolus. She completed 3 months of anticoagulation. Chest CT 12/23/2013. Small nonocclusive right lower lobe pulmonary embolus. Minimal thrombus burden. No other emboli demonstrated. Xarelto initiated. Right upper extremity and bilateral lower extremity Dopplers negative on 12/25/2013. Low back pain-MRI lumbar spine with/without contrast 09/03/2022-enhancing signal abnormality at L2 extending into the posterior elements consistent with metastatic disease with mild pathologic fracture of the superior endplate and small amount of extraosseous tumor, no other suspicious marrow signal abnormality, advanced multilevel degenerative changes with moderate to severe spinal stenosis at L3-4 and L5-S1, severe spinal stenosis at L2-3 and L4-5     Disposition: Ms. Lagunes has a remote history of breast cancer and non-Hodgkin's lymphoma.  She has low back pain and an MRI is concerning for a malignant process at L2.  I reviewed the MRI findings and images with Ms. Lauritsen.  This would be an unusual clinical presentation for lymphoma.  She is 25 years out from the breast cancer diagnosis, but this is possible.  I will discuss the case with Dr. Reatha Armour and proceed with additional imaging such as a PET scan versus a diagnostic biopsy.  Ms. Lefevers will return for an office visit in approximately 3 weeks.  She will call for increased pain or new neurologic symptoms.  [name], MD  09/07/2022  1:36 PM

## 2022-09-08 ENCOUNTER — Ambulatory Visit: Payer: No Typology Code available for payment source | Admitting: Physical Therapy

## 2022-09-08 ENCOUNTER — Telehealth: Payer: Self-pay | Admitting: *Deleted

## 2022-09-08 NOTE — Telephone Encounter (Signed)
Per Dr. Benay Spice after discussion with neurosurgeon, NS will order CT C/A/P and if scan is clear, they will arrange a biopsy of lesion on spine.

## 2022-09-11 ENCOUNTER — Telehealth: Payer: Self-pay | Admitting: Cardiology

## 2022-09-11 NOTE — Telephone Encounter (Signed)
Pt is returning call to get results. Requesting call back.

## 2022-09-11 NOTE — Telephone Encounter (Signed)
Spoke with pt she is calling for last weeks lab results. Dr Ellyn Hack has not reviewed as of yet. She will await call with results.

## 2022-09-11 NOTE — Telephone Encounter (Signed)
Labs available now - will review when done clinic. [place]

## 2022-09-11 NOTE — Telephone Encounter (Signed)
Will wait for results.

## 2022-09-12 ENCOUNTER — Encounter (HOSPITAL_COMMUNITY): Payer: Self-pay | Admitting: Neurological Surgery

## 2022-09-12 ENCOUNTER — Ambulatory Visit: Payer: No Typology Code available for payment source

## 2022-09-12 NOTE — Telephone Encounter (Signed)
Spoke to patient . She is aware on Dr Ellyn Hack  comment on results  She wanted Dr Ellyn Hack know that  she will be having a biopsy on her back from result of CT  ans also she will need another Ct Scan  being schedule.

## 2022-09-13 ENCOUNTER — Emergency Department (HOSPITAL_COMMUNITY): Payer: No Typology Code available for payment source

## 2022-09-13 ENCOUNTER — Encounter (HOSPITAL_COMMUNITY): Payer: Self-pay

## 2022-09-13 ENCOUNTER — Observation Stay (HOSPITAL_COMMUNITY)
Admission: EM | Admit: 2022-09-13 | Discharge: 2022-09-14 | Disposition: A | Discharge status: Home / Self Care | Payer: No Typology Code available for payment source | Attending: Internal Medicine | Admitting: Internal Medicine

## 2022-09-13 ENCOUNTER — Other Ambulatory Visit: Payer: Self-pay

## 2022-09-13 DIAGNOSIS — M545 Low back pain, unspecified: Secondary | ICD-10-CM | POA: Diagnosis not present

## 2022-09-13 DIAGNOSIS — I10 Essential (primary) hypertension: Secondary | ICD-10-CM | POA: Diagnosis present

## 2022-09-13 DIAGNOSIS — M5136 Other intervertebral disc degeneration, lumbar region: Secondary | ICD-10-CM | POA: Diagnosis not present

## 2022-09-13 DIAGNOSIS — E668 Other obesity: Secondary | ICD-10-CM

## 2022-09-13 DIAGNOSIS — Z79899 Other long term (current) drug therapy: Secondary | ICD-10-CM | POA: Diagnosis not present

## 2022-09-13 DIAGNOSIS — J90 Pleural effusion, not elsewhere classified: Secondary | ICD-10-CM | POA: Diagnosis not present

## 2022-09-13 DIAGNOSIS — R918 Other nonspecific abnormal finding of lung field: Secondary | ICD-10-CM | POA: Diagnosis not present

## 2022-09-13 DIAGNOSIS — M549 Dorsalgia, unspecified: Secondary | ICD-10-CM | POA: Diagnosis not present

## 2022-09-13 DIAGNOSIS — C342 Malignant neoplasm of middle lobe, bronchus or lung: Secondary | ICD-10-CM | POA: Diagnosis not present

## 2022-09-13 DIAGNOSIS — M4807 Spinal stenosis, lumbosacral region: Secondary | ICD-10-CM | POA: Diagnosis not present

## 2022-09-13 DIAGNOSIS — R2681 Unsteadiness on feet: Secondary | ICD-10-CM | POA: Insufficient documentation

## 2022-09-13 DIAGNOSIS — Z6838 Body mass index (BMI) 38.0-38.9, adult: Secondary | ICD-10-CM | POA: Diagnosis not present

## 2022-09-13 DIAGNOSIS — E785 Hyperlipidemia, unspecified: Secondary | ICD-10-CM | POA: Diagnosis not present

## 2022-09-13 DIAGNOSIS — C7951 Secondary malignant neoplasm of bone: Secondary | ICD-10-CM | POA: Diagnosis not present

## 2022-09-13 DIAGNOSIS — Z041 Encounter for examination and observation following transport accident: Secondary | ICD-10-CM | POA: Diagnosis not present

## 2022-09-13 DIAGNOSIS — I611 Nontraumatic intracerebral hemorrhage in hemisphere, cortical: Secondary | ICD-10-CM

## 2022-09-13 DIAGNOSIS — Z7982 Long term (current) use of aspirin: Secondary | ICD-10-CM | POA: Diagnosis not present

## 2022-09-13 DIAGNOSIS — Z853 Personal history of malignant neoplasm of breast: Secondary | ICD-10-CM | POA: Diagnosis not present

## 2022-09-13 DIAGNOSIS — Z8572 Personal history of non-Hodgkin lymphomas: Secondary | ICD-10-CM | POA: Insufficient documentation

## 2022-09-13 DIAGNOSIS — I629 Nontraumatic intracranial hemorrhage, unspecified: Secondary | ICD-10-CM | POA: Diagnosis not present

## 2022-09-13 DIAGNOSIS — S0990XA Unspecified injury of head, initial encounter: Secondary | ICD-10-CM | POA: Diagnosis not present

## 2022-09-13 DIAGNOSIS — N1831 Chronic kidney disease, stage 3a: Secondary | ICD-10-CM | POA: Insufficient documentation

## 2022-09-13 DIAGNOSIS — M7989 Other specified soft tissue disorders: Secondary | ICD-10-CM | POA: Diagnosis not present

## 2022-09-13 DIAGNOSIS — E876 Hypokalemia: Secondary | ICD-10-CM | POA: Diagnosis present

## 2022-09-13 DIAGNOSIS — S3993XA Unspecified injury of pelvis, initial encounter: Secondary | ICD-10-CM | POA: Diagnosis not present

## 2022-09-13 DIAGNOSIS — I129 Hypertensive chronic kidney disease with stage 1 through stage 4 chronic kidney disease, or unspecified chronic kidney disease: Secondary | ICD-10-CM | POA: Diagnosis not present

## 2022-09-13 DIAGNOSIS — S06300A Unspecified focal traumatic brain injury without loss of consciousness, initial encounter: Principal | ICD-10-CM | POA: Insufficient documentation

## 2022-09-13 DIAGNOSIS — S299XXA Unspecified injury of thorax, initial encounter: Secondary | ICD-10-CM | POA: Diagnosis not present

## 2022-09-13 DIAGNOSIS — M47816 Spondylosis without myelopathy or radiculopathy, lumbar region: Secondary | ICD-10-CM | POA: Diagnosis not present

## 2022-09-13 DIAGNOSIS — S3991XA Unspecified injury of abdomen, initial encounter: Secondary | ICD-10-CM | POA: Diagnosis not present

## 2022-09-13 DIAGNOSIS — R911 Solitary pulmonary nodule: Secondary | ICD-10-CM | POA: Insufficient documentation

## 2022-09-13 DIAGNOSIS — M542 Cervicalgia: Secondary | ICD-10-CM | POA: Diagnosis not present

## 2022-09-13 DIAGNOSIS — S066X0A Traumatic subarachnoid hemorrhage without loss of consciousness, initial encounter: Secondary | ICD-10-CM | POA: Diagnosis not present

## 2022-09-13 DIAGNOSIS — E669 Obesity, unspecified: Secondary | ICD-10-CM | POA: Diagnosis present

## 2022-09-13 LAB — COMPREHENSIVE METABOLIC PANEL
ALT: 16 U/L (ref 0–44)
AST: 22 U/L (ref 15–41)
Albumin: 3.3 g/dL — ABNORMAL LOW (ref 3.5–5.0)
Alkaline Phosphatase: 65 U/L (ref 38–126)
Anion gap: 6 (ref 5–15)
BUN: 18 mg/dL (ref 8–23)
CO2: 29 mmol/L (ref 22–32)
Calcium: 8.4 mg/dL — ABNORMAL LOW (ref 8.9–10.3)
Chloride: 106 mmol/L (ref 98–111)
Creatinine, Ser: 1.33 mg/dL — ABNORMAL HIGH (ref 0.44–1.00)
GFR, Estimated: 41 mL/min — ABNORMAL LOW (ref >60)
Glucose, Bld: 102 mg/dL — ABNORMAL HIGH (ref 70–99)
Potassium: 2.9 mmol/L — ABNORMAL LOW (ref 3.5–5.1)
Sodium: 141 mmol/L (ref 135–145)
Total Bilirubin: 0.6 mg/dL (ref 0.3–1.2)
Total Protein: 7 g/dL (ref 6.5–8.1)

## 2022-09-13 LAB — CBC
HCT: 44.2 % (ref 36.0–46.0)
Hemoglobin: 14.1 g/dL (ref 12.0–15.0)
MCH: 29.9 pg (ref 26.0–34.0)
MCHC: 31.9 g/dL (ref 30.0–36.0)
MCV: 93.8 fL (ref 80.0–100.0)
Platelets: 209 10*3/uL (ref 150–400)
RBC: 4.71 MIL/uL (ref 3.87–5.11)
RDW: 14.4 % (ref 11.5–15.5)
WBC: 8.6 10*3/uL (ref 4.0–10.5)
nRBC: 0 % (ref 0.0–0.2)

## 2022-09-13 LAB — PROTIME-INR
INR: 1 (ref 0.8–1.2)
Prothrombin Time: 13.1 seconds (ref 11.4–15.2)

## 2022-09-13 LAB — LACTIC ACID, PLASMA: Lactic Acid, Venous: 0.9 mmol/L (ref 0.5–1.9)

## 2022-09-13 MED ORDER — OXYCODONE-ACETAMINOPHEN 5-325 MG PO TABS
2.0000 | ORAL_TABLET | Freq: Once | ORAL | Status: AC
  Administered 2022-09-13: 2 via ORAL
  Filled 2022-09-13: qty 2

## 2022-09-13 MED ORDER — HYDROCHLOROTHIAZIDE 12.5 MG PO TABS
12.5000 mg | ORAL_TABLET | Freq: Every day | ORAL | Status: DC
  Administered 2022-09-14: 12.5 mg via ORAL
  Filled 2022-09-13: qty 1

## 2022-09-13 MED ORDER — METOPROLOL TARTRATE 50 MG PO TABS
50.0000 mg | ORAL_TABLET | Freq: Two times a day (BID) | ORAL | Status: DC
  Administered 2022-09-13 – 2022-09-14 (×2): 50 mg via ORAL
  Filled 2022-09-13 (×2): qty 1

## 2022-09-13 MED ORDER — ACETAMINOPHEN 650 MG RE SUPP: 650.0000 mg | Freq: Four times a day (QID) | RECTAL | Status: DC | PRN

## 2022-09-13 MED ORDER — ACETAMINOPHEN 500 MG PO TABS: 500.0000 mg | ORAL_TABLET | Freq: Four times a day (QID) | ORAL | Status: DC | PRN

## 2022-09-13 MED ORDER — LOSARTAN POTASSIUM-HCTZ 50-12.5 MG PO TABS: 1.0000 | ORAL_TABLET | Freq: Every day | ORAL | Status: DC

## 2022-09-13 MED ORDER — ONDANSETRON HCL 4 MG PO TABS: 4.0000 mg | ORAL_TABLET | Freq: Four times a day (QID) | ORAL | Status: DC | PRN

## 2022-09-13 MED ORDER — POTASSIUM CHLORIDE CRYS ER 20 MEQ PO TBCR
40.0000 meq | EXTENDED_RELEASE_TABLET | Freq: Once | ORAL | Status: AC
  Administered 2022-09-13: 40 meq via ORAL
  Filled 2022-09-13: qty 2

## 2022-09-13 MED ORDER — ONDANSETRON HCL 4 MG/2ML IJ SOLN: 4.0000 mg | Freq: Four times a day (QID) | INTRAMUSCULAR | Status: DC | PRN

## 2022-09-13 MED ORDER — LORAZEPAM 0.5 MG PO TABS
0.5000 mg | ORAL_TABLET | Freq: Two times a day (BID) | ORAL | Status: DC | PRN
  Administered 2022-09-14: 0.5 mg via ORAL
  Filled 2022-09-13: qty 1

## 2022-09-13 MED ORDER — ONDANSETRON HCL 4 MG/2ML IJ SOLN
4.0000 mg | Freq: Once | INTRAMUSCULAR | Status: AC
  Administered 2022-09-13: 4 mg via INTRAVENOUS
  Filled 2022-09-13: qty 2

## 2022-09-13 MED ORDER — OXYCODONE HCL 5 MG PO TABS
5.0000 mg | ORAL_TABLET | Freq: Four times a day (QID) | ORAL | Status: DC | PRN
  Administered 2022-09-13 – 2022-09-14 (×2): 5 mg via ORAL
  Filled 2022-09-13 (×2): qty 1

## 2022-09-13 MED ORDER — ACETAMINOPHEN 325 MG PO TABS
650.0000 mg | ORAL_TABLET | Freq: Four times a day (QID) | ORAL | Status: DC | PRN
  Administered 2022-09-14 (×2): 650 mg via ORAL
  Filled 2022-09-13 (×2): qty 2

## 2022-09-13 MED ORDER — LOSARTAN POTASSIUM 50 MG PO TABS
50.0000 mg | ORAL_TABLET | Freq: Every day | ORAL | Status: DC
  Administered 2022-09-14: 50 mg via ORAL
  Filled 2022-09-13: qty 1
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
## 2022-09-13 NOTE — Assessment & Plan Note (Signed)
Continue blood pressure control with losartan. HCTZ and metoprolol Hold on aspirin

## 2022-09-13 NOTE — Assessment & Plan Note (Addendum)
CKD stage 3a  Electrolytes have been corrected.  Na 140, K 3,7 and serum bicarbonate at 26. Plan to continue current diuretic therapy for blood pressure control with HCTZ and follow up renal function as outpatient.

## 2022-09-13 NOTE — ED Notes (Signed)
Family states that the patient "has a tumor in her L2, they are trying to figure out where her cancer is she is suppose to have a PET scan soon. The tumor invades the spinal cord and surrounding soft tissue."

## 2022-09-13 NOTE — ED Notes (Signed)
Provider at bedside

## 2022-09-13 NOTE — Assessment & Plan Note (Signed)
New diagnosis of lung mass likely source of metastatic lesions.  5,9 x 2.7 x 3.2 cm right middle lobe mass with right middle branch airway occlusion.  Positive right pleural effusion Plan to follow up as outpatient with Dr Benay Spice, likely will need further work up with bronchoscopy.  Patient may need a referral for outpatient pulmonary.   She is a established patient with Dr Benay Spice and she has an appointment in 2 weeks, she is in the process of getting final work up, she may need further consultation with Pulmonary as outpatient  Patient and her son are in agreement in going home today and follow up as outpatient. She has no dyspnea or cough, no respiratory symptoms.   At the time of her discharge her 02 saturation is 95% on room air.

## 2022-09-13 NOTE — Assessment & Plan Note (Signed)
Calculated BMI is 36,3 consistent with obesity class 2
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
## 2022-09-13 NOTE — Assessment & Plan Note (Signed)
Continue with statin therapy.  ?
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
## 2022-09-13 NOTE — ED Notes (Signed)
Patient transported to CT 

## 2022-09-13 NOTE — ED Notes (Signed)
Patient attempted to obtain a urine sample, but was unsuccessful.

## 2022-09-14 DIAGNOSIS — E785 Hyperlipidemia, unspecified: Secondary | ICD-10-CM | POA: Diagnosis not present

## 2022-09-14 DIAGNOSIS — I629 Nontraumatic intracranial hemorrhage, unspecified: Secondary | ICD-10-CM | POA: Diagnosis not present

## 2022-09-14 DIAGNOSIS — E668 Other obesity: Secondary | ICD-10-CM | POA: Diagnosis not present

## 2022-09-14 DIAGNOSIS — S066X0A Traumatic subarachnoid hemorrhage without loss of consciousness, initial encounter: Secondary | ICD-10-CM | POA: Diagnosis not present

## 2022-09-14 DIAGNOSIS — I10 Essential (primary) hypertension: Secondary | ICD-10-CM | POA: Diagnosis not present

## 2022-09-14 LAB — BASIC METABOLIC PANEL
Anion gap: 5 (ref 5–15)
BUN: 20 mg/dL (ref 8–23)
CO2: 26 mmol/L (ref 22–32)
Calcium: 8.1 mg/dL — ABNORMAL LOW (ref 8.9–10.3)
Chloride: 109 mmol/L (ref 98–111)
Creatinine, Ser: 1.32 mg/dL — ABNORMAL HIGH (ref 0.44–1.00)
GFR, Estimated: 42 mL/min — ABNORMAL LOW (ref >60)
Glucose, Bld: 125 mg/dL — ABNORMAL HIGH (ref 70–99)
Potassium: 3.7 mmol/L (ref 3.5–5.1)
Sodium: 140 mmol/L (ref 135–145)

## 2022-09-14 LAB — CBC
HCT: 40.9 % (ref 36.0–46.0)
Hemoglobin: 13 g/dL (ref 12.0–15.0)
MCH: 30.4 pg (ref 26.0–34.0)
MCHC: 31.8 g/dL (ref 30.0–36.0)
MCV: 95.6 fL (ref 80.0–100.0)
Platelets: 198 10*3/uL (ref 150–400)
RBC: 4.28 MIL/uL (ref 3.87–5.11)
RDW: 14.6 % (ref 11.5–15.5)
WBC: 7 10*3/uL (ref 4.0–10.5)
nRBC: 0 % (ref 0.0–0.2)

## 2022-09-14 LAB — URINALYSIS, ROUTINE W REFLEX MICROSCOPIC
Bilirubin Urine: NEGATIVE
Glucose, UA: NEGATIVE mg/dL
Hgb urine dipstick: NEGATIVE
Ketones, ur: NEGATIVE mg/dL
Leukocytes,Ua: NEGATIVE
Nitrite: NEGATIVE
Protein, ur: NEGATIVE mg/dL
Specific Gravity, Urine: 1.018 (ref 1.005–1.030)
pH: 5 (ref 5.0–8.0)

## 2022-09-14 MED ORDER — OXYCODONE HCL 5 MG PO TABS: 5.0000 mg | ORAL_TABLET | Freq: Four times a day (QID) | ORAL | 0 refills | Status: DC | PRN

## 2022-09-14 MED ORDER — ACETAMINOPHEN 325 MG PO TABS: 650.0000 mg | ORAL_TABLET | Freq: Four times a day (QID) | ORAL | Status: DC | PRN
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
## 2022-09-18 DIAGNOSIS — Z6836 Body mass index (BMI) 36.0-36.9, adult: Secondary | ICD-10-CM | POA: Diagnosis not present

## 2022-09-18 DIAGNOSIS — D492 Neoplasm of unspecified behavior of bone, soft tissue, and skin: Secondary | ICD-10-CM | POA: Diagnosis not present

## 2022-09-19 ENCOUNTER — Telehealth: Payer: Self-pay | Admitting: *Deleted

## 2022-09-19 ENCOUNTER — Inpatient Hospital Stay (HOSPITAL_BASED_OUTPATIENT_CLINIC_OR_DEPARTMENT_OTHER): Payer: No Typology Code available for payment source | Admitting: Oncology

## 2022-09-19 VITALS — BP 129/84 | HR 100 | Temp 98.1°F | Resp 18 | Ht 65.0 in | Wt 222.4 lb

## 2022-09-19 DIAGNOSIS — C8599 Non-Hodgkin lymphoma, unspecified, extranodal and solid organ sites: Secondary | ICD-10-CM

## 2022-09-19 DIAGNOSIS — R918 Other nonspecific abnormal finding of lung field: Secondary | ICD-10-CM | POA: Diagnosis not present
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
## 2022-09-19 NOTE — Telephone Encounter (Signed)
Notified of thoracentesis at [facility] on 11/30 at 1300/1330 and check in at admitting. No prep.

## 2022-09-21 ENCOUNTER — Ambulatory Visit (HOSPITAL_COMMUNITY)
Admission: RE | Admit: 2022-09-21 | Discharge: 2022-09-21 | Disposition: A | Discharge status: Home / Self Care | Payer: No Typology Code available for payment source | Source: Ambulatory Visit | Attending: Student | Admitting: Student

## 2022-09-21 ENCOUNTER — Ambulatory Visit (HOSPITAL_COMMUNITY)
Admission: RE | Admit: 2022-09-21 | Discharge: 2022-09-21 | Disposition: A | Discharge status: Home / Self Care | Payer: No Typology Code available for payment source | Source: Ambulatory Visit | Attending: Oncology | Admitting: Oncology

## 2022-09-21 VITALS — BP 113/69

## 2022-09-21 DIAGNOSIS — C3490 Malignant neoplasm of unspecified part of unspecified bronchus or lung: Secondary | ICD-10-CM | POA: Insufficient documentation

## 2022-09-21 DIAGNOSIS — R918 Other nonspecific abnormal finding of lung field: Secondary | ICD-10-CM

## 2022-09-21 DIAGNOSIS — J90 Pleural effusion, not elsewhere classified: Secondary | ICD-10-CM | POA: Diagnosis not present

## 2022-09-21 DIAGNOSIS — C8599 Non-Hodgkin lymphoma, unspecified, extranodal and solid organ sites: Secondary | ICD-10-CM | POA: Diagnosis not present

## 2022-09-21 DIAGNOSIS — Z853 Personal history of malignant neoplasm of breast: Secondary | ICD-10-CM | POA: Insufficient documentation

## 2022-09-21 DIAGNOSIS — J910 Malignant pleural effusion: Secondary | ICD-10-CM | POA: Diagnosis not present

## 2022-09-21 DIAGNOSIS — C8590 Non-Hodgkin lymphoma, unspecified, unspecified site: Secondary | ICD-10-CM | POA: Diagnosis not present

## 2022-09-21 LAB — PROTEIN, PLEURAL OR PERITONEAL FLUID: Total protein, fluid: 3.6 g/dL

## 2022-09-21 LAB — BODY FLUID CELL COUNT WITH DIFFERENTIAL
Eos, Fluid: 0 %
Lymphs, Fluid: 50 %
Monocyte-Macrophage-Serous Fluid: 47 % — ABNORMAL LOW (ref 50–90)
Neutrophil Count, Fluid: 3 % (ref 0–25)
Total Nucleated Cell Count, Fluid: 1235 cu mm — ABNORMAL HIGH (ref 0–1000)

## 2022-09-21 MED ORDER — LIDOCAINE HCL 1 % IJ SOLN
INTRAMUSCULAR | Status: AC
  Filled 2022-09-21: qty 20

## 2022-09-21 NOTE — Procedures (Signed)
PROCEDURE SUMMARY:  Successful US guided right thoracentesis. Yielded 900 ml of clear yellow fluid. Pt tolerated procedure well. No immediate complications.  Specimen sent for labs. CXR ordered; no post-procedure pneumothorax identified  EBL < 2 mL  Theresa Duty, NP 09/21/2022 2:09 PM

## 2022-09-25 ENCOUNTER — Other Ambulatory Visit: Payer: Self-pay | Admitting: Nurse Practitioner

## 2022-09-25 DIAGNOSIS — C8599 Non-Hodgkin lymphoma, unspecified, extranodal and solid organ sites: Secondary | ICD-10-CM

## 2022-09-25 MED ORDER — OXYCODONE HCL 5 MG PO TABS: 5.0000 mg | ORAL_TABLET | Freq: Four times a day (QID) | ORAL | 0 refills | Status: DC | PRN

## 2022-09-26 LAB — CYTOLOGY - NON PAP

## 2022-09-29 ENCOUNTER — Encounter: Payer: Self-pay | Admitting: *Deleted

## 2022-09-29 ENCOUNTER — Telehealth: Payer: Self-pay | Admitting: Radiation Oncology

## 2022-09-29 ENCOUNTER — Inpatient Hospital Stay: Payer: No Typology Code available for payment source | Attending: Oncology | Admitting: Oncology

## 2022-09-29 VITALS — BP 140/90 | HR 92 | Temp 98.1°F | Resp 20 | Ht 65.0 in | Wt 219.0 lb

## 2022-09-29 DIAGNOSIS — C3491 Malignant neoplasm of unspecified part of right bronchus or lung: Secondary | ICD-10-CM

## 2022-09-29 DIAGNOSIS — Z8572 Personal history of non-Hodgkin lymphomas: Secondary | ICD-10-CM | POA: Diagnosis not present

## 2022-09-29 DIAGNOSIS — C342 Malignant neoplasm of middle lobe, bronchus or lung: Secondary | ICD-10-CM | POA: Insufficient documentation

## 2022-09-29 DIAGNOSIS — J90 Pleural effusion, not elsewhere classified: Secondary | ICD-10-CM | POA: Insufficient documentation

## 2022-09-29 DIAGNOSIS — C7951 Secondary malignant neoplasm of bone: Secondary | ICD-10-CM | POA: Insufficient documentation

## 2022-09-29 DIAGNOSIS — Z853 Personal history of malignant neoplasm of breast: Secondary | ICD-10-CM | POA: Diagnosis not present

## 2022-09-29 DIAGNOSIS — M549 Dorsalgia, unspecified: Secondary | ICD-10-CM | POA: Diagnosis not present

## 2022-09-29 DIAGNOSIS — C3490 Malignant neoplasm of unspecified part of unspecified bronchus or lung: Secondary | ICD-10-CM | POA: Insufficient documentation

## 2022-09-29 NOTE — Progress Notes (Signed)
Foundation one and PDL-1 testing requested from Accession number [id number]
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
## 2022-09-29 NOTE — Telephone Encounter (Signed)
12/8 @ 11:08 am Left voicemail for patient to call our office to be schedule for consult with Dr. Tammi Klippel.

## 2022-10-01 ENCOUNTER — Encounter: Payer: Self-pay | Admitting: Cardiology

## 2022-10-01 NOTE — Assessment & Plan Note (Signed)
Unfortunately difficult for her to exercise.  Discussed dietary modification, however with concern for recurrent cancer is a possibility, I will be reluctant to adjust her diet for fear of potentially leading to some malnutrition.

## 2022-10-01 NOTE — Assessment & Plan Note (Signed)
Clearly multifactorial.  Error exercise balance very much limited by her musculoskeletal pains.  Hopefully with physical therapy she can regain some of her strength.

## 2022-10-01 NOTE — Assessment & Plan Note (Signed)
Blood pressure pretty stable.  She is on stable dose of losartan and HCTZ 50-12.5 mg as well as Lopressor (metoprolol tartrate) 50 mg twice daily No change.  Check CMP to assess renal function

## 2022-10-01 NOTE — Assessment & Plan Note (Signed)
Well-controlled on current dose of beta-blocker.

## 2022-10-01 NOTE — Assessment & Plan Note (Signed)
As of her most recent echo, EF is back to 50-55% with no RWMA.  No active heart failure symptoms. Mostly limited by back, leg pain as well as obesity and deconditioning. Euvolemic on exam.  She is back on her ARB-HCTZ combination along with beta-blocker.  No further diuretic besides HCTZ required.

## 2022-10-01 NOTE — Assessment & Plan Note (Addendum)
Not currently on statin-related to myalgias. Labs checked today showed LDL 120 which is up from last visit.  Low threshold to consider treating, however she has lots of issues ongoing with her back and hip pain and concerns for recurrent cancers.  I think there is more important things to treat right now the lipids.  A1c also checked today was 5.6.
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
## 2022-10-03 ENCOUNTER — Other Ambulatory Visit: Payer: Self-pay | Admitting: Urology

## 2022-10-03 ENCOUNTER — Ambulatory Visit
Admission: RE | Admit: 2022-10-03 | Discharge: 2022-10-03 | Disposition: A | Discharge status: Home / Self Care | Payer: No Typology Code available for payment source | Source: Ambulatory Visit | Attending: Radiation Oncology | Admitting: Radiation Oncology

## 2022-10-03 VITALS — BP 119/80 | HR 84 | Temp 97.9°F | Resp 20 | Ht 65.0 in | Wt 217.2 lb

## 2022-10-03 DIAGNOSIS — Z9221 Personal history of antineoplastic chemotherapy: Secondary | ICD-10-CM | POA: Insufficient documentation

## 2022-10-03 DIAGNOSIS — J90 Pleural effusion, not elsewhere classified: Secondary | ICD-10-CM | POA: Diagnosis not present

## 2022-10-03 DIAGNOSIS — K219 Gastro-esophageal reflux disease without esophagitis: Secondary | ICD-10-CM | POA: Diagnosis not present

## 2022-10-03 DIAGNOSIS — Z79899 Other long term (current) drug therapy: Secondary | ICD-10-CM | POA: Diagnosis not present

## 2022-10-03 DIAGNOSIS — C3411 Malignant neoplasm of upper lobe, right bronchus or lung: Secondary | ICD-10-CM

## 2022-10-03 DIAGNOSIS — C7951 Secondary malignant neoplasm of bone: Secondary | ICD-10-CM | POA: Insufficient documentation

## 2022-10-03 DIAGNOSIS — Z8572 Personal history of non-Hodgkin lymphomas: Secondary | ICD-10-CM | POA: Diagnosis not present

## 2022-10-03 DIAGNOSIS — Z510 Encounter for antineoplastic radiation therapy: Secondary | ICD-10-CM | POA: Insufficient documentation

## 2022-10-03 DIAGNOSIS — I10 Essential (primary) hypertension: Secondary | ICD-10-CM | POA: Diagnosis not present

## 2022-10-03 DIAGNOSIS — E7800 Pure hypercholesterolemia, unspecified: Secondary | ICD-10-CM | POA: Diagnosis not present

## 2022-10-03 DIAGNOSIS — Z853 Personal history of malignant neoplasm of breast: Secondary | ICD-10-CM | POA: Diagnosis not present

## 2022-10-03 DIAGNOSIS — E669 Obesity, unspecified: Secondary | ICD-10-CM | POA: Diagnosis not present

## 2022-10-03 DIAGNOSIS — C8599 Non-Hodgkin lymphoma, unspecified, extranodal and solid organ sites: Secondary | ICD-10-CM

## 2022-10-03 MED ORDER — OXYCODONE HCL 5 MG PO TABS: 5.0000 mg | ORAL_TABLET | Freq: Four times a day (QID) | ORAL | 0 refills | Status: DC | PRN

## 2022-10-03 NOTE — Progress Notes (Signed)
  Radiation Oncology         [phone] ________________________________  Name: Crystal [name] MRN: [id number]  Date: 10/03/2022  DOB: [birth year]  SIMULATION AND TREATMENT PLANNING NOTE    ICD-10-CM   1. Metastasis to spinal column (HCC)  C79.51       DIAGNOSIS:  76 y.o. 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  Identity was confirmed.  All relevant records and images related to the planned course of therapy were reviewed.  The patient freely provided informed written consent to proceed with treatment after reviewing the details related to the planned course of therapy. The consent form was witnessed and verified by the simulation staff.  Then, the patient was set-up in a stable reproducible  supine position for radiation therapy.  CT images were obtained.  Surface markings were placed.  The CT images were loaded into the planning software.  Then the target and avoidance structures were contoured including kidneys.  Treatment planning then occurred.  The radiation prescription was entered and confirmed.  Then, I designed and supervised the construction of a total of 3 medically necessary complex treatment devices with VacLoc positioner and at least 2 MLCs to shield kidneys.  I have requested : 3D Simulation  I have requested a DVH of the following structures: Left Kidney, Right Kidney and target.  PLAN:  The patient will receive 30 Gy in 10 fractions.  ________________________________  Crystal [name], M.D.
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
## 2022-10-05 ENCOUNTER — Other Ambulatory Visit: Payer: Self-pay

## 2022-10-05 ENCOUNTER — Ambulatory Visit
Admission: RE | Admit: 2022-10-05 | Discharge: 2022-10-05 | Disposition: A | Discharge status: Home / Self Care | Payer: No Typology Code available for payment source | Source: Ambulatory Visit | Attending: Radiation Oncology | Admitting: Radiation Oncology

## 2022-10-05 DIAGNOSIS — Z510 Encounter for antineoplastic radiation therapy: Secondary | ICD-10-CM | POA: Diagnosis not present

## 2022-10-05 LAB — RAD ONC ARIA SESSION SUMMARY
Course Elapsed Days: 0
Plan Fractions Treated to Date: 1
Plan Prescribed Dose Per Fraction: 3 Gy
Plan Total Fractions Prescribed: 10
Plan Total Prescribed Dose: 30 Gy
Reference Point Dosage Given to Date: 3 Gy
Reference Point Session Dosage Given: 3 Gy
Session Number: 1

## 2022-10-06 ENCOUNTER — Ambulatory Visit: Payer: No Typology Code available for payment source

## 2022-10-06 ENCOUNTER — Ambulatory Visit
Admission: RE | Admit: 2022-10-06 | Discharge: 2022-10-06 | Disposition: A | Discharge status: Home / Self Care | Payer: No Typology Code available for payment source | Source: Ambulatory Visit | Attending: Radiation Oncology | Admitting: Radiation Oncology

## 2022-10-06 ENCOUNTER — Other Ambulatory Visit: Payer: Self-pay

## 2022-10-06 DIAGNOSIS — C3411 Malignant neoplasm of upper lobe, right bronchus or lung: Secondary | ICD-10-CM | POA: Diagnosis not present

## 2022-10-06 DIAGNOSIS — C7951 Secondary malignant neoplasm of bone: Secondary | ICD-10-CM | POA: Diagnosis not present

## 2022-10-06 DIAGNOSIS — C3490 Malignant neoplasm of unspecified part of unspecified bronchus or lung: Secondary | ICD-10-CM | POA: Diagnosis not present

## 2022-10-06 DIAGNOSIS — Z510 Encounter for antineoplastic radiation therapy: Secondary | ICD-10-CM | POA: Diagnosis not present

## 2022-10-06 DIAGNOSIS — I629 Nontraumatic intracranial hemorrhage, unspecified: Secondary | ICD-10-CM | POA: Diagnosis not present

## 2022-10-06 DIAGNOSIS — I951 Orthostatic hypotension: Secondary | ICD-10-CM | POA: Diagnosis not present

## 2022-10-06 DIAGNOSIS — I10 Essential (primary) hypertension: Secondary | ICD-10-CM | POA: Diagnosis not present

## 2022-10-06 LAB — RAD ONC ARIA SESSION SUMMARY
Course Elapsed Days: 1
Plan Fractions Treated to Date: 2
Plan Prescribed Dose Per Fraction: 3 Gy
Plan Total Fractions Prescribed: 10
Plan Total Prescribed Dose: 30 Gy
Reference Point Dosage Given to Date: 6 Gy
Reference Point Session Dosage Given: 3 Gy
Session Number: 2

## 2022-10-09 ENCOUNTER — Other Ambulatory Visit: Payer: Self-pay

## 2022-10-09 ENCOUNTER — Encounter (HOSPITAL_COMMUNITY): Payer: Self-pay | Admitting: Oncology

## 2022-10-09 ENCOUNTER — Ambulatory Visit
Admission: RE | Admit: 2022-10-09 | Discharge: 2022-10-09 | Disposition: A | Discharge status: Home / Self Care | Payer: No Typology Code available for payment source | Source: Ambulatory Visit | Attending: Radiation Oncology | Admitting: Radiation Oncology

## 2022-10-09 DIAGNOSIS — Z510 Encounter for antineoplastic radiation therapy: Secondary | ICD-10-CM | POA: Diagnosis not present

## 2022-10-09 LAB — RAD ONC ARIA SESSION SUMMARY
Course Elapsed Days: 4
Plan Fractions Treated to Date: 3
Plan Prescribed Dose Per Fraction: 3 Gy
Plan Total Fractions Prescribed: 10
Plan Total Prescribed Dose: 30 Gy
Reference Point Dosage Given to Date: 9 Gy
Reference Point Session Dosage Given: 3 Gy
Session Number: 3

## 2022-10-10 ENCOUNTER — Other Ambulatory Visit: Payer: Self-pay

## 2022-10-10 ENCOUNTER — Ambulatory Visit
Admission: RE | Admit: 2022-10-10 | Discharge: 2022-10-10 | Disposition: A | Discharge status: Home / Self Care | Payer: No Typology Code available for payment source | Source: Ambulatory Visit | Attending: Radiation Oncology | Admitting: Radiation Oncology

## 2022-10-10 DIAGNOSIS — Z510 Encounter for antineoplastic radiation therapy: Secondary | ICD-10-CM | POA: Diagnosis not present

## 2022-10-10 LAB — RAD ONC ARIA SESSION SUMMARY
Course Elapsed Days: 5
Plan Fractions Treated to Date: 4
Plan Prescribed Dose Per Fraction: 3 Gy
Plan Total Fractions Prescribed: 10
Plan Total Prescribed Dose: 30 Gy
Reference Point Dosage Given to Date: 12 Gy
Reference Point Session Dosage Given: 3 Gy
Session Number: 4

## 2022-10-11 ENCOUNTER — Other Ambulatory Visit: Payer: Self-pay

## 2022-10-11 ENCOUNTER — Ambulatory Visit
Admission: RE | Admit: 2022-10-11 | Discharge: 2022-10-11 | Disposition: A | Discharge status: Home / Self Care | Payer: No Typology Code available for payment source | Source: Ambulatory Visit | Attending: Radiation Oncology | Admitting: Radiation Oncology

## 2022-10-11 DIAGNOSIS — C3411 Malignant neoplasm of upper lobe, right bronchus or lung: Secondary | ICD-10-CM | POA: Diagnosis not present

## 2022-10-11 DIAGNOSIS — Z510 Encounter for antineoplastic radiation therapy: Secondary | ICD-10-CM | POA: Diagnosis not present

## 2022-10-11 DIAGNOSIS — C7951 Secondary malignant neoplasm of bone: Secondary | ICD-10-CM | POA: Diagnosis not present

## 2022-10-11 LAB — RAD ONC ARIA SESSION SUMMARY
Course Elapsed Days: 6
Plan Fractions Treated to Date: 5
Plan Prescribed Dose Per Fraction: 3 Gy
Plan Total Fractions Prescribed: 10
Plan Total Prescribed Dose: 30 Gy
Reference Point Dosage Given to Date: 15 Gy
Reference Point Session Dosage Given: 3 Gy
Session Number: 5

## 2022-10-12 ENCOUNTER — Inpatient Hospital Stay: Payer: No Typology Code available for payment source | Admitting: Nurse Practitioner

## 2022-10-12 ENCOUNTER — Ambulatory Visit
Admission: RE | Admit: 2022-10-12 | Discharge: 2022-10-12 | Disposition: A | Discharge status: Home / Self Care | Payer: No Typology Code available for payment source | Source: Ambulatory Visit | Attending: Radiation Oncology | Admitting: Radiation Oncology

## 2022-10-12 ENCOUNTER — Other Ambulatory Visit: Payer: Self-pay

## 2022-10-12 ENCOUNTER — Encounter: Payer: Self-pay | Admitting: Oncology

## 2022-10-12 ENCOUNTER — Encounter (HOSPITAL_COMMUNITY): Payer: Self-pay | Admitting: Oncology

## 2022-10-12 DIAGNOSIS — C342 Malignant neoplasm of middle lobe, bronchus or lung: Secondary | ICD-10-CM | POA: Diagnosis not present

## 2022-10-12 DIAGNOSIS — C8599 Non-Hodgkin lymphoma, unspecified, extranodal and solid organ sites: Secondary | ICD-10-CM

## 2022-10-12 DIAGNOSIS — Z510 Encounter for antineoplastic radiation therapy: Secondary | ICD-10-CM | POA: Diagnosis not present

## 2022-10-12 DIAGNOSIS — C3490 Malignant neoplasm of unspecified part of unspecified bronchus or lung: Secondary | ICD-10-CM | POA: Diagnosis not present

## 2022-10-12 LAB — RAD ONC ARIA SESSION SUMMARY
Course Elapsed Days: 7
Plan Fractions Treated to Date: 6
Plan Prescribed Dose Per Fraction: 3 Gy
Plan Total Fractions Prescribed: 10
Plan Total Prescribed Dose: 30 Gy
Reference Point Dosage Given to Date: 18 Gy
Reference Point Session Dosage Given: 3 Gy
Session Number: 6

## 2022-10-12 MED ORDER — OXYCODONE HCL 5 MG PO TABS: 5.0000 mg | ORAL_TABLET | Freq: Four times a day (QID) | ORAL | 0 refills | Status: DC | PRN
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
## 2022-10-13 ENCOUNTER — Other Ambulatory Visit: Payer: Self-pay

## 2022-10-13 ENCOUNTER — Ambulatory Visit
Admission: RE | Admit: 2022-10-13 | Discharge: 2022-10-13 | Disposition: A | Discharge status: Home / Self Care | Payer: No Typology Code available for payment source | Source: Ambulatory Visit | Attending: Radiation Oncology | Admitting: Radiation Oncology

## 2022-10-13 DIAGNOSIS — Z510 Encounter for antineoplastic radiation therapy: Secondary | ICD-10-CM | POA: Diagnosis not present

## 2022-10-13 LAB — RAD ONC ARIA SESSION SUMMARY
Course Elapsed Days: 8
Plan Fractions Treated to Date: 7
Plan Prescribed Dose Per Fraction: 3 Gy
Plan Total Fractions Prescribed: 10
Plan Total Prescribed Dose: 30 Gy
Reference Point Dosage Given to Date: 21 Gy
Reference Point Session Dosage Given: 3 Gy
Session Number: 7

## 2022-10-17 ENCOUNTER — Other Ambulatory Visit: Payer: Self-pay

## 2022-10-17 ENCOUNTER — Ambulatory Visit
Admission: RE | Admit: 2022-10-17 | Discharge: 2022-10-17 | Disposition: A | Discharge status: Home / Self Care | Payer: No Typology Code available for payment source | Source: Ambulatory Visit | Attending: Radiation Oncology | Admitting: Radiation Oncology

## 2022-10-17 DIAGNOSIS — C3411 Malignant neoplasm of upper lobe, right bronchus or lung: Secondary | ICD-10-CM | POA: Diagnosis not present

## 2022-10-17 DIAGNOSIS — C7951 Secondary malignant neoplasm of bone: Secondary | ICD-10-CM | POA: Diagnosis not present

## 2022-10-17 DIAGNOSIS — Z510 Encounter for antineoplastic radiation therapy: Secondary | ICD-10-CM | POA: Diagnosis not present

## 2022-10-17 LAB — RAD ONC ARIA SESSION SUMMARY
Course Elapsed Days: 12
Plan Fractions Treated to Date: 8
Plan Prescribed Dose Per Fraction: 3 Gy
Plan Total Fractions Prescribed: 10
Plan Total Prescribed Dose: 30 Gy
Reference Point Dosage Given to Date: 24 Gy
Reference Point Session Dosage Given: 3 Gy
Session Number: 8

## 2022-10-18 ENCOUNTER — Ambulatory Visit
Admission: RE | Admit: 2022-10-18 | Discharge: 2022-10-18 | Disposition: A | Discharge status: Home / Self Care | Payer: No Typology Code available for payment source | Source: Ambulatory Visit | Attending: Radiation Oncology | Admitting: Radiation Oncology

## 2022-10-18 ENCOUNTER — Other Ambulatory Visit: Payer: Self-pay

## 2022-10-18 DIAGNOSIS — Z510 Encounter for antineoplastic radiation therapy: Secondary | ICD-10-CM | POA: Diagnosis not present

## 2022-10-18 LAB — RAD ONC ARIA SESSION SUMMARY
Course Elapsed Days: 13
Plan Fractions Treated to Date: 9
Plan Prescribed Dose Per Fraction: 3 Gy
Plan Total Fractions Prescribed: 10
Plan Total Prescribed Dose: 30 Gy
Reference Point Dosage Given to Date: 27 Gy
Reference Point Session Dosage Given: 3 Gy
Session Number: 9

## 2022-10-19 ENCOUNTER — Ambulatory Visit
Admission: RE | Admit: 2022-10-19 | Discharge: 2022-10-19 | Disposition: A | Discharge status: Home / Self Care | Payer: No Typology Code available for payment source | Source: Ambulatory Visit | Attending: Radiation Oncology | Admitting: Radiation Oncology

## 2022-10-19 ENCOUNTER — Encounter: Payer: Self-pay | Admitting: Urology

## 2022-10-19 ENCOUNTER — Other Ambulatory Visit: Payer: Self-pay

## 2022-10-19 DIAGNOSIS — C7951 Secondary malignant neoplasm of bone: Secondary | ICD-10-CM | POA: Diagnosis not present

## 2022-10-19 DIAGNOSIS — C3411 Malignant neoplasm of upper lobe, right bronchus or lung: Secondary | ICD-10-CM | POA: Diagnosis not present

## 2022-10-19 DIAGNOSIS — Z510 Encounter for antineoplastic radiation therapy: Secondary | ICD-10-CM | POA: Diagnosis not present

## 2022-10-19 LAB — RAD ONC ARIA SESSION SUMMARY
Course Elapsed Days: 14
Plan Fractions Treated to Date: 10
Plan Prescribed Dose Per Fraction: 3 Gy
Plan Total Fractions Prescribed: 10
Plan Total Prescribed Dose: 30 Gy
Reference Point Dosage Given to Date: 30 Gy
Reference Point Session Dosage Given: 3 Gy
Session Number: 10

## 2022-10-24 ENCOUNTER — Ambulatory Visit (HOSPITAL_BASED_OUTPATIENT_CLINIC_OR_DEPARTMENT_OTHER)
Admission: RE | Admit: 2022-10-24 | Discharge: 2022-10-24 | Disposition: A | Discharge status: Home / Self Care | Payer: No Typology Code available for payment source | Source: Ambulatory Visit | Attending: Oncology | Admitting: Oncology

## 2022-10-24 ENCOUNTER — Inpatient Hospital Stay: Payer: No Typology Code available for payment source | Attending: Oncology | Admitting: Oncology

## 2022-10-24 VITALS — BP 132/76 | HR 98 | Temp 98.1°F | Resp 20 | Ht 65.0 in | Wt 215.0 lb

## 2022-10-24 DIAGNOSIS — C3491 Malignant neoplasm of unspecified part of right bronchus or lung: Secondary | ICD-10-CM | POA: Insufficient documentation

## 2022-10-24 DIAGNOSIS — Z79620 Long term (current) use of immunosuppressive biologic: Secondary | ICD-10-CM | POA: Insufficient documentation

## 2022-10-24 DIAGNOSIS — C342 Malignant neoplasm of middle lobe, bronchus or lung: Secondary | ICD-10-CM | POA: Insufficient documentation

## 2022-10-24 DIAGNOSIS — C8599 Non-Hodgkin lymphoma, unspecified, extranodal and solid organ sites: Secondary | ICD-10-CM

## 2022-10-24 DIAGNOSIS — C3490 Malignant neoplasm of unspecified part of unspecified bronchus or lung: Secondary | ICD-10-CM | POA: Diagnosis not present

## 2022-10-24 DIAGNOSIS — J90 Pleural effusion, not elsewhere classified: Secondary | ICD-10-CM | POA: Diagnosis not present

## 2022-10-24 DIAGNOSIS — J910 Malignant pleural effusion: Secondary | ICD-10-CM | POA: Insufficient documentation

## 2022-10-24 DIAGNOSIS — C7951 Secondary malignant neoplasm of bone: Secondary | ICD-10-CM | POA: Insufficient documentation

## 2022-10-24 MED ORDER — OXYCODONE HCL 5 MG PO TABS: 5.0000 mg | ORAL_TABLET | Freq: Four times a day (QID) | ORAL | 0 refills | Status: DC | PRN
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
## 2022-10-25 ENCOUNTER — Other Ambulatory Visit: Payer: Self-pay

## 2022-10-25 ENCOUNTER — Other Ambulatory Visit (HOSPITAL_COMMUNITY): Payer: No Typology Code available for payment source

## 2022-10-25 ENCOUNTER — Telehealth: Payer: Self-pay

## 2022-10-25 DIAGNOSIS — C342 Malignant neoplasm of middle lobe, bronchus or lung: Secondary | ICD-10-CM

## 2022-10-25 NOTE — Telephone Encounter (Signed)
-----   Message from Ladell Pier, MD sent at 10/24/2022  5:27 PM EST ----- Please call patient, the right pleural effusion is larger, let us know if she has significant dyspnea we will arrange for a repeat therapeutic thoracentesis

## 2022-10-25 NOTE — Telephone Encounter (Signed)
Patient gave verbal understanding and she is schedule for Friday morning the thoracentesis

## 2022-10-26 ENCOUNTER — Other Ambulatory Visit: Payer: Self-pay | Admitting: Interventional Radiology

## 2022-10-26 ENCOUNTER — Other Ambulatory Visit: Payer: Self-pay | Admitting: Oncology

## 2022-10-26 ENCOUNTER — Ambulatory Visit
Admission: RE | Admit: 2022-10-26 | Discharge: 2022-10-26 | Disposition: A | Discharge status: Home / Self Care | Payer: No Typology Code available for payment source | Source: Ambulatory Visit | Attending: Interventional Radiology | Admitting: Interventional Radiology

## 2022-10-26 ENCOUNTER — Encounter: Payer: Self-pay | Admitting: General Practice

## 2022-10-26 DIAGNOSIS — C7951 Secondary malignant neoplasm of bone: Secondary | ICD-10-CM | POA: Diagnosis not present

## 2022-10-26 DIAGNOSIS — C3490 Malignant neoplasm of unspecified part of unspecified bronchus or lung: Secondary | ICD-10-CM | POA: Diagnosis not present

## 2022-10-26 DIAGNOSIS — M8458XA Pathological fracture in neoplastic disease, other specified site, initial encounter for fracture: Secondary | ICD-10-CM | POA: Diagnosis not present

## 2022-10-26 DIAGNOSIS — C3491 Malignant neoplasm of unspecified part of right bronchus or lung: Secondary | ICD-10-CM

## 2022-10-26 HISTORY — PX: IR RADIOLOGIST EVAL & MGMT: IMG5224

## 2022-10-26 NOTE — Progress Notes (Signed)
Mugweru, Crystal Glaser, MD  Esmond Plants, Marva Panda, NT; Plover, Loistine Simas, Farrel Conners, Tiffany Cc: de Rosario Jacks, MD Spoke w Dr Benay Spice.  Approach would be similar and therefore he is OK with [place] proceeding with L2 Bx, RFA and BKP This can be done at [facility] or [facility], by any VIR that does spine or by Dr Neena Rhymes [facility] for her). whomever you may schedule with soonest.  Pt will need a consultation for insurance submission.  Please contact me with questions, concerns, or if issue pertaining to this request arise.  Michaelle Birks, MD [facility] Radiology  Pager. [phone] Clinic. [phone]       Previous Messages    ----- Message ----- From: Michaelle Birks, MD Sent: 10/25/2022   5:05 PM EST To: Allen Kell, NT Subject: RE: CT Biopsy                                  BIOPSY REVIEW Date: 10/25/22  Requested Biopsy site: L2 Reason for request: Mets  Imaging review: I reviewed all pertinent diagnostic studies, including; MR L spine 09/13/22 and CT CAP, 09/13/22 Recommended imaging modality to perform biopsy: Other  Additional comments: Pt is post radiation therapy to L2 lesion and remains symptomatic.  Will discuss with Dr Benay Spice about simultaneous Bx, RFA and BKP for symptomatic spinal metastatic lesion.  Please contact me with questions, concerns, or if issue pertaining to this request arise.  Michaelle Birks, MD [facility] Radiology  Pager. [phone] Clinic. [phone]  ----- Message ----- From: Allen Kell, NT Sent: 10/24/2022   3:06 PM EST To: Ir Procedure Requests Subject: CT Biopsy                                      Procedure: CT Biopsy  Reason: Non-small cell lung cancer, L2 metastasis  History: CT  in chart  Provider: Ladell Pier, MD  Contact: [phone]
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
## 2022-10-27 ENCOUNTER — Ambulatory Visit (HOSPITAL_COMMUNITY)
Admission: RE | Admit: 2022-10-27 | Discharge: 2022-10-27 | Disposition: A | Discharge status: Home / Self Care | Payer: No Typology Code available for payment source | Source: Ambulatory Visit | Attending: Oncology | Admitting: Oncology

## 2022-10-27 ENCOUNTER — Ambulatory Visit (HOSPITAL_COMMUNITY)
Admission: RE | Admit: 2022-10-27 | Discharge: 2022-10-27 | Disposition: A | Discharge status: Home / Self Care | Payer: No Typology Code available for payment source | Source: Ambulatory Visit | Attending: Physician Assistant | Admitting: Physician Assistant

## 2022-10-27 VITALS — BP 152/115

## 2022-10-27 DIAGNOSIS — J90 Pleural effusion, not elsewhere classified: Secondary | ICD-10-CM | POA: Diagnosis not present

## 2022-10-27 DIAGNOSIS — C342 Malignant neoplasm of middle lobe, bronchus or lung: Secondary | ICD-10-CM | POA: Insufficient documentation

## 2022-10-27 DIAGNOSIS — C801 Malignant (primary) neoplasm, unspecified: Secondary | ICD-10-CM | POA: Diagnosis not present

## 2022-10-27 MED ORDER — LIDOCAINE HCL 1 % IJ SOLN
INTRAMUSCULAR | Status: AC
  Administered 2022-10-27: 10 mL
  Filled 2022-10-27: qty 20

## 2022-10-27 NOTE — Procedures (Signed)
PROCEDURE SUMMARY:  Successful US guided right thoracentesis. Yielded 1696ml of amber fluid. Pt tolerated procedure well. No immediate complications.  Specimen not sent for labs. CXR ordered.  EBL < 5 mL  [place] Delauder PA-C 10/27/2022 12:07 PM

## 2022-10-30 ENCOUNTER — Telehealth (HOSPITAL_COMMUNITY): Payer: Self-pay

## 2022-10-30 NOTE — Telephone Encounter (Signed)
Called to schedule osteocool, no answer, left vm. AW

## 2022-10-31 ENCOUNTER — Telehealth: Payer: Self-pay | Admitting: *Deleted

## 2022-10-31 ENCOUNTER — Other Ambulatory Visit: Payer: Self-pay | Admitting: Oncology

## 2022-10-31 MED ORDER — LORAZEPAM 0.5 MG PO TABS: 0.5000 mg | ORAL_TABLET | Freq: Two times a day (BID) | ORAL | 0 refills | Status: DC | PRN

## 2022-10-31 NOTE — Telephone Encounter (Signed)
Already called in

## 2022-10-31 NOTE — Telephone Encounter (Signed)
Called to request to move her 1/12 f/u to following week since biopsy is on 1/11. Also requesting refill on her lorazepam. Refill completed and scheduler made aware of appointment change need.

## 2022-11-01 ENCOUNTER — Other Ambulatory Visit: Payer: Self-pay | Admitting: Internal Medicine

## 2022-11-01 ENCOUNTER — Other Ambulatory Visit: Payer: Self-pay | Admitting: Radiology

## 2022-11-01 DIAGNOSIS — C3490 Malignant neoplasm of unspecified part of unspecified bronchus or lung: Secondary | ICD-10-CM

## 2022-11-02 ENCOUNTER — Encounter (HOSPITAL_COMMUNITY): Payer: Self-pay

## 2022-11-02 ENCOUNTER — Ambulatory Visit (HOSPITAL_COMMUNITY)
Admission: RE | Admit: 2022-11-02 | Discharge: 2022-11-02 | Disposition: A | Discharge status: Home / Self Care | Payer: No Typology Code available for payment source | Source: Ambulatory Visit | Attending: Oncology | Admitting: Oncology

## 2022-11-02 ENCOUNTER — Other Ambulatory Visit: Payer: Self-pay

## 2022-11-02 DIAGNOSIS — Z539 Procedure and treatment not carried out, unspecified reason: Secondary | ICD-10-CM | POA: Insufficient documentation

## 2022-11-02 DIAGNOSIS — M8448XA Pathological fracture, other site, initial encounter for fracture: Secondary | ICD-10-CM | POA: Diagnosis not present

## 2022-11-02 DIAGNOSIS — C3491 Malignant neoplasm of unspecified part of right bronchus or lung: Secondary | ICD-10-CM

## 2022-11-02 DIAGNOSIS — C3490 Malignant neoplasm of unspecified part of unspecified bronchus or lung: Secondary | ICD-10-CM

## 2022-11-02 LAB — CBC
HCT: 40.9 % (ref 36.0–46.0)
Hemoglobin: 12.9 g/dL (ref 12.0–15.0)
MCH: 29.5 pg (ref 26.0–34.0)
MCHC: 31.5 g/dL (ref 30.0–36.0)
MCV: 93.4 fL (ref 80.0–100.0)
Platelets: 216 10*3/uL (ref 150–400)
RBC: 4.38 MIL/uL (ref 3.87–5.11)
RDW: 14.7 % (ref 11.5–15.5)
WBC: 4.9 10*3/uL (ref 4.0–10.5)
nRBC: 0 % (ref 0.0–0.2)

## 2022-11-02 LAB — PROTIME-INR
INR: 1 (ref 0.8–1.2)
Prothrombin Time: 13.4 seconds (ref 11.4–15.2)

## 2022-11-02 LAB — BASIC METABOLIC PANEL
Anion gap: 9 (ref 5–15)
BUN: 11 mg/dL (ref 8–23)
CO2: 25 mmol/L (ref 22–32)
Calcium: 7.9 mg/dL — ABNORMAL LOW (ref 8.9–10.3)
Chloride: 105 mmol/L (ref 98–111)
Creatinine, Ser: 1.21 mg/dL — ABNORMAL HIGH (ref 0.44–1.00)
GFR, Estimated: 46 mL/min — ABNORMAL LOW (ref >60)
Glucose, Bld: 105 mg/dL — ABNORMAL HIGH (ref 70–99)
Potassium: 2.8 mmol/L — ABNORMAL LOW (ref 3.5–5.1)
Sodium: 139 mmol/L (ref 135–145)

## 2022-11-02 MED ORDER — SODIUM CHLORIDE 0.9 % IV SOLN: INTRAVENOUS | Status: DC

## 2022-11-02 MED ORDER — CEFAZOLIN SODIUM-DEXTROSE 2-4 GM/100ML-% IV SOLN: 2.0000 g | INTRAVENOUS | Status: DC

## 2022-11-02 NOTE — Consult Note (Signed)
Chief Complaint: Crystal [name] was seen in consultation today for L2 pathologic compression fracture  Referring Physician(s): Ladell Pier  Supervising Physician: Michaelle Birks  Crystal [name] Status: [facility] - Out-pt  History of Present Illness: Crystal [name] is a 77 y.o. female with past medical history of remote R breast cancer, NHL, and recent diagnosis of NSCLC who was recently found to have a L2 pathologic compression fracture and malignant R pleural effusion. Crystal [name] was seen in telephone consultation by Dr. Maryelizabeth Kaufmann 10/26/21 to discuss management options for Crystal [name] pathologic fracture including biopsy, Ostecool ablation, and vertebroplasty/kyphoplasty. After discussion Crystal [name] is agreeable to proceed with intervention.   Crystal [name] presents to [facility] today in Crystal [name] usual state of health.  Crystal [name] has been NPO.  Crystal [name] does not take blood thinners.  Crystal [name] has been using oxycodone PRN related to back pain and continues to endorse lower back pain particularly with getting from a seated position and with walking to perform routine chores like grocery shopping.  Crystal [name] would like to be able to decrease Crystal [name] pain medication use.  Crystal [name] is aware of the goals of the procedure and is agreeable to proceed.   Crystal [name] sisters, Opal Sidles and Horris Latino, are available for transportation and care post-procedure.   Past Medical History:  Diagnosis Date   Anxiety    Atherosclerosis of aorta ([place])    Breast cancer ([place]) 1998   (Rt) lumpectomy dx 1999; Dr. Benay Spice   Dyslipidemia, goal LDL below 130    Essential hypertension    GERD (gastroesophageal reflux disease)    Hypercholesterolemia    IBS (irritable bowel syndrome)    Lymphoma ([place])    lymphoma dx 11/28/10 - left neck   non hodgkins lymphoma 12/2010   right aprotid gland   Nonischemic cardiomyopathy ([place]) 2008   ? Doxorubincin induced; essentially resolved as of echo in January 2014, current EF 50-55%. Grade 1 diastolic dysfunction.   Obesity    Severe obesity (BMI >= 40) ([place])  05/21/2013   Improve to BMI of 39 by July 2015   Tremor     Past Surgical History:  Procedure Laterality Date   ABDOMINAL HYSTERECTOMY     BSO   ANKLE FRACTURE SURGERY Left    BREAST EXCISIONAL BIOPSY Left    BREAST EXCISIONAL BIOPSY Right    BREAST LUMPECTOMY Right 1998   BREAST LUMPECTOMY Right 08/25/2020   Procedure: RIGHT BREAST LUMPECTOMY;  Surgeon: Coralie Keens, MD;  Location: [place];  Service: General;  Laterality: Right;   CHOLECYSTECTOMY N/A 06/09/2020   Procedure: LAPAROSCOPIC CHOLECYSTECTOMY;  Surgeon: Coralie Keens, MD;  Location: [facility];  Service: General;  Laterality: N/A;   COLONOSCOPY     IR RADIOLOGIST EVAL & MGMT  10/26/2022   LEFT HEART CATH AND CORONARY ANGIOGRAPHY  12/2007   None coronary disease, EF 45% (up from nuclear study EF of 30% in June '08)   NM MYOVIEW LTD  03/29/2007   EF 33%,NEGATIVE ISCHEMIA, prob need cath   NM MYOVIEW LTD  05/01/2014   Lexiscan: EF 55%. Normal wall motion; no ischemia or infarction; apical thinning   PORTACATH PLACEMENT  08/25/2013   rt. with tip in cavoatrial junction, Dr.Yamagata    TRANSTHORACIC ECHOCARDIOGRAM  05/01/2014   Normal LV size with low normal function. EF of 50-55% and Gr 1 DD; aortic sclerosis without stenosis. MAC and thickening/calcification of the anterior leaflet - no notable AI / AS or MR/MS    Allergies: Simvastatin, Zoloft [sertraline], Codeine, Coreg, Lisinopril, and Tizanidine hcl  Medications: Prior to Admission medications   Medication Sig Start Date End Date Taking? Authorizing Provider  acetaminophen (TYLENOL) 325 MG tablet Take 2 tablets (650 mg total) by mouth every 6 (six) hours as needed for mild pain or moderate pain. 09/14/22   Arrien, Jimmy Picket, MD  ibuprofen (ADVIL) 100 MG tablet Take 100 mg by mouth every 6 (six) hours as needed for fever.    [provider]  LORazepam (ATIVAN) 0.5 MG tablet Take 1 tablet (0.5 mg total) by mouth 2 (two) times daily  as needed. 10/31/22   Ladell Pier, MD  metoprolol tartrate (LOPRESSOR) 50 MG tablet Take 50 mg by mouth 2 (two) times daily.    [provider]  OVER THE COUNTER MEDICATION Take 1 capsule by mouth daily. Gallbladder Enzymes OTC    [provider]  oxyCODONE (OXY IR/ROXICODONE) 5 MG immediate release tablet Take 1 tablet (5 mg total) by mouth every 6 (six) hours as needed for severe pain. 10/24/22   Ladell Pier, MD     Family History  Problem Relation Age of Onset   Heart Problems Mother        CABG   Chronic Renal Failure Mother    Heart Problems Father    Hypertension Sister    Cancer Sister        bladder   Migraines Sister    Heart attack Sister    Hypertension Sister        x2   Lupus Brother    Hypertension Brother        and lupus   Heart Problems Maternal Grandmother    Stroke Maternal Grandfather    Cancer Paternal Grandmother        stomach   Heart Problems Paternal Grandfather     Social History   Socioeconomic History   Marital status: Widowed    Spouse name: Not on file   Number of children: 3   Years of education: hs   Highest education level: Not on file  Occupational History   Occupation: Retired  Tobacco Use   Smoking status: Never   Smokeless tobacco: Never  Vaping Use   Vaping Use: Never used  Substance and Sexual Activity   Alcohol use: Yes    Comment: social    Drug use: No   Sexual activity: Not on file  Other Topics Concern   Not on file  Social History Narrative   Crystal [name] is a widow mother of 2, with 1 child is deceased. Crystal [name] has 2 grandchildren. Crystal [name] is trying to get some walking in town and can do about 20-25 minutes his another 30 minutes Crystal [name] pushes a more night gets short of breath.   Crystal [name] is at retired [employer], he simply laid off after 45 years.   Crystal [name] usually comes accompanied by Crystal [name] sister, who is also my Crystal [name].   Crystal [name] never was a smoker, and only occasionally has a social alcoholic beverage.   Social  Determinants of Health   Financial Resource Strain: Not on file  Food Insecurity: No Food Insecurity (09/14/2022)   Hunger Vital Sign    Worried About Running Out of Food in the Last Year: Never true    Ran Out of Food in the Last Year: Never true  Transportation Needs: No Transportation Needs (09/14/2022)   PRAPARE - Hydrologist (Medical): No    Lack of Transportation (Non-Medical): No  Physical Activity: Not on file  Stress:  Not on file  Social Connections: Not on file     Review of Systems: A 12 point ROS discussed and pertinent positives are indicated in the HPI above.  All other systems are negative.  Review of Systems  Constitutional:  Negative for fatigue and fever.  Respiratory:  Negative for cough and shortness of breath.   Cardiovascular:  Negative for chest pain.  Gastrointestinal:  Negative for abdominal pain.  Musculoskeletal:  Positive for back pain (lower back pain).  Psychiatric/Behavioral:  Negative for behavioral problems and confusion.     Vital Signs: BP (!) 119/92   Pulse 88   Temp (!) 97.2 F (36.2 C) (Temporal)   Resp 20   Ht _0  (1.651 m)   Wt 215 lb (97.5 kg)   SpO2 96%   BMI 35.78 kg/m   Physical Exam Vitals and nursing note reviewed.  Constitutional:      General: Crystal [name] is not in acute distress.    Appearance: Normal appearance. Crystal [name] is not ill-appearing.  HENT:     Mouth/Throat:     Mouth: Mucous membranes are moist.     Pharynx: Oropharynx is clear.  Cardiovascular:     Rate and Rhythm: Normal rate and regular rhythm.  Pulmonary:     Effort: Pulmonary effort is normal.     Breath sounds: Normal breath sounds.  Abdominal:     General: Abdomen is flat.     Palpations: Abdomen is soft.  Skin:    General: Skin is warm and dry.  Neurological:     General: No focal deficit present.     Mental Status: Crystal [name] is alert and oriented to person, place, and time. Mental status is at baseline.  Psychiatric:         Mood and Affect: Mood normal.        Behavior: Behavior normal.        Thought Content: Thought content normal.        Judgment: Judgment normal.      MD Evaluation Airway: WNL Heart: WNL Abdomen: WNL Chest/ Lungs: WNL ASA  Classification: 3 Mallampati/Airway Score: Two   Imaging: US Thoracentesis Asp Pleural space w/IMG guide  Result Date: 10/27/2022 INDICATION: Metastatic disease EXAM: ULTRASOUND GUIDED RIGHT THORACENTESIS MEDICATIONS: None. COMPLICATIONS: None immediate. PROCEDURE: An ultrasound guided thoracentesis was thoroughly discussed with the Crystal [name] and questions answered. The benefits, risks, alternatives and complications were also discussed. The Crystal [name] understands and wishes to proceed with the procedure. Written consent was obtained. Ultrasound was performed to localize and mark an adequate pocket of fluid in the right chest. The area was then prepped and draped in the normal sterile fashion. 1% Lidocaine was used for local anesthesia. Under ultrasound guidance a 6 Fr Safe-T-Centesis catheter was introduced. Thoracentesis was performed. The catheter was removed and a dressing applied. FINDINGS: A total of approximately 1610m of amber fluid was removed. IMPRESSION: Successful ultrasound guided right thoracentesis yielding 16565mof pleural fluid. Performed and dictated by HaPasty SpillersPA-C Electronically Signed   By: M.Jerilynn Mages Shick M.D.   On: 10/27/2022 12:11   DG CHEST PORT 1 VIEW  Result Date: 10/27/2022 CLINICAL DATA:  S/P thoracentesis EXAM: PORTABLE CHEST 1 VIEW COMPARISON:  Chest x-ray 10/24/2022. FINDINGS: Decreased right pleural effusion. No visible pneumothorax. Mild right basilar opacities, likely atelectasis. Cardiomediastinal silhouette is similar. No acute osseous abnormality IMPRESSION: 1. Decreased right pleural effusion. No visible pneumothorax. 2. Probable right basilar atelectasis. Electronically Signed   By: FrMargaretha Sheffield.D.   On:  10/27/2022 11:35   IR  Radiologist Eval & Mgmt  Result Date: 10/26/2022 EXAM: NEW Crystal [name] OFFICE VISIT CHIEF COMPLAINT: See below HISTORY OF PRESENT ILLNESS: See below REVIEW OF SYSTEMS: See below PHYSICAL EXAMINATION: See below ASSESSMENT AND PLAN: Please refer to completed note in the electronic medical record on [place]o Mugweru, MD [facility] Electronically Signed   By: Michaelle Birks M.D.   On: 10/26/2022 11:53   DG Chest 2 View  Result Date: 10/24/2022 CLINICAL DATA:  Lung cancer.  Follow-up right pleural effusion. EXAM: CHEST - 2 VIEW COMPARISON:  September 21, 2022 FINDINGS: The heart size and mediastinal contours are stable. Moderate right pleural effusion is identified significantly increasing compared to prior exam. Masslike lesion identified right mid lung unchanged. The left lung is clear. The visualized skeletal structures are unremarkable. IMPRESSION: 1. Moderate right pleural effusion significantly increasing compared to prior exam. 2. Masslike lesion identified right mid lung unchanged. Electronically Signed   By: Abelardo Diesel M.D.   On: 10/24/2022 15:04    Labs:  CBC: Recent Labs    09/04/22 0906 09/13/22 1321 09/14/22 0359 11/02/22 0844  WBC 4.8 8.6 7.0 4.9  HGB 13.5 14.1 13.0 12.9  HCT 40.8 44.2 40.9 40.9  PLT 250 209 198 216    COAGS: Recent Labs    09/13/22 1321 11/02/22 0844  INR 1.0 1.0    BMP: Recent Labs    09/04/22 0906 09/13/22 1321 09/14/22 0359 11/02/22 0844  NA 143 141 140 139  K 3.6 2.9* 3.7 2.8*  CL 106 106 109 105  CO2 _0 GLUCOSE 92 102* 125* 105*  BUN _1 CALCIUM 9.6 8.4* 8.1* 7.9*  CREATININE 1.39* 1.33* 1.32* 1.21*  GFRNONAA  --  41* 42* 46*    LIVER FUNCTION TESTS: Recent Labs    09/04/22 0906 09/13/22 1321  BILITOT 0.3 0.6  AST 18 22  ALT 10 16  ALKPHOS 82 65  PROT 6.4 7.0  ALBUMIN 3.5* 3.3*    TUMOR MARKERS: No results for input(s): "AFPTM", "CEA",  "CA199", "CHROMGRNA" in the last 8760 hours.  Assessment and Plan: Metastatic lung cancer, symptomatic L2 pathologic fracture s/p palliative XRT Crystal [name] with past medical history of metastatic lung cancer presents with complaint of pathologic L2 compression fracture.  IR consulted for Osteocool ablation with vertebroplasty/kyphoplasty at the request of Dr. Benay Spice. Case reviewed by Dr. Maryelizabeth Kaufmann who has met with Crystal [name] in consultation and who approves Crystal [name] for procedure.  Crystal [name] presents today in their usual state of health.  Crystal [name] has been NPO and is not currently on blood thinners.   Risks and benefits were discussed with the Crystal [name] including, but not limited to education regarding the natural healing process of compression fractures without intervention, bleeding, infection, cement migration which may cause spinal cord damage, paralysis, pulmonary embolism or even death.  This interventional procedure involves the use of X-rays and because of the nature of the planned procedure, it is possible that we will have prolonged use of X-ray fluoroscopy.  Potential radiation risks to you include (but are not limited to) the following: - A slightly elevated risk for cancer  several years later in life. This risk is typically less than 0.5% percent. This risk is low in comparison to the normal incidence of human cancer, which is 33% for women and 50% for men according to the [place]. - Radiation induced injury can include skin  redness, resembling a rash, tissue breakdown / ulcers and hair loss (which can be temporary or permanent).   The likelihood of either of these occurring depends on the difficulty of the procedure and whether you are sensitive to radiation due to previous procedures, disease, or genetic conditions.   IF your procedure requires a prolonged use of radiation, you will be notified and given written instructions for further action.  It is your responsibility to  monitor the irradiated area for the 2 weeks following the procedure and to notify your physician if you are concerned that you have suffered a radiation induced injury.    All of the Crystal [name]'s questions were answered, Crystal [name] is agreeable to proceed.  Consent signed and in chart.  Thank you for this interesting consult.  I greatly enjoyed meeting Crystal [name] and look forward to participating in their care.  A copy of this report was sent to the requesting provider on this date.  Electronically Signed: Docia Barrier, PA 11/02/2022, 11:34 AM   I spent a total of  30 Minutes   in face to face in clinical consultation, greater than 50% of which was counseling/coordinating care for L2 compression fracture.

## 2022-11-03 ENCOUNTER — Inpatient Hospital Stay: Payer: No Typology Code available for payment source | Admitting: Nurse Practitioner

## 2022-11-03 ENCOUNTER — Other Ambulatory Visit (HOSPITAL_COMMUNITY): Payer: Self-pay | Admitting: Physician Assistant

## 2022-11-03 ENCOUNTER — Telehealth: Payer: Self-pay | Admitting: *Deleted

## 2022-11-03 DIAGNOSIS — Z8781 Personal history of (healed) traumatic fracture: Secondary | ICD-10-CM

## 2022-11-03 MED ORDER — CEFAZOLIN SODIUM-DEXTROSE 2-4 GM/100ML-% IV SOLN: 2.0000 g | INTRAVENOUS | Status: AC

## 2022-11-03 NOTE — Telephone Encounter (Addendum)
Her biopsy has been scheduled for 1/15 at 1 pm. Requesting to move her 1/16 visit to later in week when pathology will be available. Per Dr. Truett Perna: OK to move 1/16 OV to later in week on 1/17 or 1/18 with NP. Scheduling message sent

## 2022-11-05 ENCOUNTER — Encounter (HOSPITAL_COMMUNITY): Payer: Self-pay

## 2022-11-05 NOTE — H&P (Signed)
Chief Complaint: L2 Pathological fracture. Patient presents for L2 biopsy, Ostecool ablation, and vertebroplasty/kyphoplasty  Referring Physician(s): Sherrill,Gary B  Supervising Physician: {Supervising Physician:[id number]}  Patient Status: [facility] - Out-pt  History of Present Illness: Crystal [name] is a 77 y.o. female female outpatient. Known to IR. Remote history of  breast cancer (right), HLD, GERD, Non hodgkin's lymphoma, metastatic NSCLC.  recently found to have a L2 pathologic compression fracture and malignant R pleural effusion. She was seen in via telephone consultation by IR Attending Dr. Maryelizabeth Kaufmann 1.4.24 to discuss management options for her pathologic fracture including biopsy, Ostecool ablation, and vertebroplasty/kyphoplasty. The patient was given several different treatment options including. After discussion  the Patient is agreeable to proceed with intervention. Patient was rescheduled from 1.11.24 due to emergent IR cases.   Past Medical History:  Diagnosis Date   Anxiety    Atherosclerosis of aorta ([place])    Breast cancer ([place]) 1998   (Rt) lumpectomy dx 1999; Dr. Benay Spice   Dyslipidemia, goal LDL below 130    Essential hypertension    GERD (gastroesophageal reflux disease)    Hypercholesterolemia    IBS (irritable bowel syndrome)    Lymphoma ([place])    lymphoma dx 11/28/10 - left neck   non hodgkins lymphoma 12/2010   right aprotid gland   Nonischemic cardiomyopathy ([place]) 2008   ? Doxorubincin induced; essentially resolved as of echo in January 2014, current EF 50-55%. Grade 1 diastolic dysfunction.   Obesity    Severe obesity (BMI >= 40) ([place]) 05/21/2013   Improve to BMI of 39 by July 2015   Tremor     Past Surgical History:  Procedure Laterality Date   ABDOMINAL HYSTERECTOMY     BSO   ANKLE FRACTURE SURGERY Left    BREAST EXCISIONAL BIOPSY Left    BREAST EXCISIONAL BIOPSY Right    BREAST LUMPECTOMY Right 1998   BREAST LUMPECTOMY Right 08/25/2020    Procedure: RIGHT BREAST LUMPECTOMY;  Surgeon: Coralie Keens, MD;  Location: [facility];  Service: General;  Laterality: Right;   CHOLECYSTECTOMY N/A 06/09/2020   Procedure: LAPAROSCOPIC CHOLECYSTECTOMY;  Surgeon: Coralie Keens, MD;  Location: [facility];  Service: General;  Laterality: N/A;   COLONOSCOPY     IR RADIOLOGIST EVAL & MGMT  10/26/2022   LEFT HEART CATH AND CORONARY ANGIOGRAPHY  12/2007   None coronary disease, EF 45% (up from nuclear study EF of 30% in June '08)   NM MYOVIEW LTD  03/29/2007   EF 33%,NEGATIVE ISCHEMIA, prob need cath   NM MYOVIEW LTD  05/01/2014   Lexiscan: EF 55%. Normal wall motion; no ischemia or infarction; apical thinning   PORTACATH PLACEMENT  08/25/2013   rt. with tip in cavoatrial junction, Dr.Yamagata    TRANSTHORACIC ECHOCARDIOGRAM  05/01/2014   Normal LV size with low normal function. EF of 50-55% and Gr 1 DD; aortic sclerosis without stenosis. MAC and thickening/calcification of the anterior leaflet - no notable AI / AS or MR/MS    Allergies: Simvastatin, Zoloft [sertraline], Codeine, Coreg, Lisinopril, and Tizanidine hcl  Medications: Prior to Admission medications   Medication Sig Start Date End Date Taking? Authorizing Provider  acetaminophen (TYLENOL) 325 MG tablet Take 2 tablets (650 mg total) by mouth every 6 (six) hours as needed for mild pain or moderate pain. 09/14/22   Arrien, Jimmy Picket, MD  ibuprofen (ADVIL) 100 MG tablet Take 100 mg by mouth every 6 (six) hours as needed for fever.  [provider]  LORazepam (ATIVAN) 0.5 MG tablet Take 1 tablet (0.5 mg total) by mouth 2 (two) times daily as needed. 10/31/22   Ladell Pier, MD  metoprolol tartrate (LOPRESSOR) 50 MG tablet Take 50 mg by mouth 2 (two) times daily.    [provider]  OVER THE COUNTER MEDICATION Take 1 capsule by mouth daily. Gallbladder Enzymes OTC    [provider]  oxyCODONE (OXY IR/ROXICODONE) 5 MG immediate release  tablet Take 1 tablet (5 mg total) by mouth every 6 (six) hours as needed for severe pain. 10/24/22   Ladell Pier, MD     Family History  Problem Relation Age of Onset   Heart Problems Mother        CABG   Chronic Renal Failure Mother    Heart Problems Father    Hypertension Sister    Cancer Sister        bladder   Migraines Sister    Heart attack Sister    Hypertension Sister        x2   Lupus Brother    Hypertension Brother        and lupus   Heart Problems Maternal Grandmother    Stroke Maternal Grandfather    Cancer Paternal Grandmother        stomach   Heart Problems Paternal Grandfather     Social History   Socioeconomic History   Marital status: Widowed    Spouse name: Not on file   Number of children: 3   Years of education: hs   Highest education level: Not on file  Occupational History   Occupation: Retired  Tobacco Use   Smoking status: Never   Smokeless tobacco: Never  Vaping Use   Vaping Use: Never used  Substance and Sexual Activity   Alcohol use: Yes    Comment: social    Drug use: No   Sexual activity: Not on file  Other Topics Concern   Not on file  Social History Narrative   She is a widow mother of 2, with 1 child is deceased. She has 2 grandchildren. She is trying to get some walking in town and can do about 20-25 minutes his another 30 minutes she pushes a more night gets short of breath.   She is at retired [employer], he simply laid off after 45 years.   She usually comes accompanied by her sister, who is also my patient.   She never was a smoker, and only occasionally has a social alcoholic beverage.   Social Determinants of Health   Financial Resource Strain: Not on file  Food Insecurity: No Food Insecurity (09/14/2022)   Hunger Vital Sign    Worried About Running Out of Food in the Last Year: Never true    Ran Out of Food in the Last Year: Never true  Transportation Needs: No Transportation Needs (09/14/2022)    PRAPARE - Hydrologist (Medical): No    Lack of Transportation (Non-Medical): No  Physical Activity: Not on file  Stress: Not on file  Social Connections: Not on file    ECOG Status: {CHL ONC ECOG FB:[id number]}  Review of Systems: A 12 point ROS discussed and pertinent positives are indicated in the HPI above.  All other systems are negative.  Review of Systems  Vital Signs: There were no vitals taken for this visit.  Advance Care Plan: {Advance Care OEUM:35361}    Physical Exam  Imaging:  US Thoracentesis Asp Pleural space w/IMG guide  Result Date: 10/27/2022 INDICATION: Metastatic disease EXAM: ULTRASOUND GUIDED RIGHT THORACENTESIS MEDICATIONS: None. COMPLICATIONS: None immediate. PROCEDURE: An ultrasound guided thoracentesis was thoroughly discussed with the patient and questions answered. The benefits, risks, alternatives and complications were also discussed. The patient understands and wishes to proceed with the procedure. Written consent was obtained. Ultrasound was performed to localize and mark an adequate pocket of fluid in the right chest. The area was then prepped and draped in the normal sterile fashion. 1% Lidocaine was used for local anesthesia. Under ultrasound guidance a 6 Fr Safe-T-Centesis catheter was introduced. Thoracentesis was performed. The catheter was removed and a dressing applied. FINDINGS: A total of approximately 1676m of amber fluid was removed. IMPRESSION: Successful ultrasound guided right thoracentesis yielding 16565mof pleural fluid. Performed and dictated by HaPasty SpillersPA-C Electronically Signed   By: M.Jerilynn Mages Shick M.D.   On: 10/27/2022 12:11   DG CHEST PORT 1 VIEW  Result Date: 10/27/2022 CLINICAL DATA:  S/P thoracentesis EXAM: PORTABLE CHEST 1 VIEW COMPARISON:  Chest x-ray 10/24/2022. FINDINGS: Decreased right pleural effusion. No visible pneumothorax. Mild right basilar opacities, likely atelectasis. Cardiomediastinal  silhouette is similar. No acute osseous abnormality IMPRESSION: 1. Decreased right pleural effusion. No visible pneumothorax. 2. Probable right basilar atelectasis. Electronically Signed   By: FrMargaretha Sheffield.D.   On: 10/27/2022 11:35   IR Radiologist Eval & Mgmt  Result Date: 10/26/2022 EXAM: NEW PATIENT OFFICE VISIT CHIEF COMPLAINT: See below HISTORY OF PRESENT ILLNESS: See below REVIEW OF SYSTEMS: See below PHYSICAL EXAMINATION: See below ASSESSMENT AND PLAN: Please refer to completed note in the electronic medical record on [place], MD Vascular and Interventional Radiology Specialists [facility] Electronically Signed   By: JoMichaelle Birks.D.   On: 10/26/2022 11:53   DG Chest 2 View  Result Date: 10/24/2022 CLINICAL DATA:  Lung cancer.  Follow-up right pleural effusion. EXAM: CHEST - 2 VIEW COMPARISON:  September 21, 2022 FINDINGS: The heart size and mediastinal contours are stable. Moderate right pleural effusion is identified significantly increasing compared to prior exam. Masslike lesion identified right mid lung unchanged. The left lung is clear. The visualized skeletal structures are unremarkable. IMPRESSION: 1. Moderate right pleural effusion significantly increasing compared to prior exam. 2. Masslike lesion identified right mid lung unchanged. Electronically Signed   By: WeAbelardo Diesel.D.   On: 10/24/2022 15:04    Labs:  CBC: Recent Labs    09/04/22 0906 09/13/22 1321 09/14/22 0359 11/02/22 0844  WBC 4.8 8.6 7.0 4.9  HGB 13.5 14.1 13.0 12.9  HCT 40.8 44.2 40.9 40.9  PLT 250 209 198 216    COAGS: Recent Labs    09/13/22 1321 11/02/22 0844  INR 1.0 1.0    BMP: Recent Labs    09/04/22 0906 09/13/22 1321 09/14/22 0359 11/02/22 0844  NA 143 141 140 139  K 3.6 2.9* 3.7 2.8*  CL 106 106 109 105  CO2 _0 GLUCOSE 92 102* 125* 105*  BUN _1 CALCIUM 9.6 8.4* 8.1* 7.9*  CREATININE 1.39* 1.33* 1.32* 1.21*  GFRNONAA  --   41* 42* 46*    LIVER FUNCTION TESTS: Recent Labs    09/04/22 0906 09/13/22 1321  BILITOT 0.3 0.6  AST 18 22  ALT 10 16  ALKPHOS 82 65  PROT 6.4 7.0  ALBUMIN 3.5* 3.3*      Assessment and Plan:  [age].o.  female outpatient. Known to IR. Remote history of  breast cancer (right), HLD, GERD, Non hodgkin's lymphoma, metastatic NSCLC.  recently found to have a L2 pathologic compression fracture and malignant R pleural effusion. She was seen in via telephone consultation by IR Attending Dr. Maryelizabeth Kaufmann 1.4.24 to discuss management options for her pathologic fracture including biopsy, Ostecool ablation, and vertebroplasty/kyphoplasty. The patient was given several different treatment options including. After discussion  the Patient is agreeable to proceed with intervention. Patient was rescheduled from 1.11.24 due to emergent IR cases.    *** All labs and medications are within acceptable parameters. Allergies include codeine. Patient has been NPO since midnight. Patient has been NPO since midnight.   The Risks and benefits of biopsy, Ostecool ablation, and vertebroplasty/kyphoplasty. were discussed with the patient including, but not limited to bleeding, infection, vascular injury, post operative pain, or contrast induced renal failure.  This procedure involves the use of X-rays and because of the nature of the planned procedure, it is possible that we will have prolonged use of X-ray fluoroscopy.  Potential radiation risks to you include (but are not limited to) the following: - A slightly elevated risk for cancer several years later in life. This risk is typically less than 0.5% percent. This risk is low in comparison to the normal incidence of human cancer, which is 33% for women and 50% for men according to the [place]. - Radiation induced injury can include skin redness, resembling a rash, tissue breakdown / ulcers and hair loss (which can be temporary or permanent).   The  likelihood of either of these occurring depends on the difficulty of the procedure and whether you are sensitive to radiation due to previous procedures, disease, or genetic conditions.   IF your procedure requires a prolonged use of radiation, you will be notified and given written instructions for further action.  It is your responsibility to monitor the irradiated area for the 2 weeks following the procedure and to notify your physician if you are concerned that you have suffered a radiation induced injury.    All of the patient's questions were answered, patient is agreeable to proceed. Consent signed and in chart.    Thank you for this interesting consult.  I greatly enjoyed meeting Crystal [name] and look forward to participating in their care.  A copy of this report was sent to the requesting provider on this date.  Electronically Signed: Jacqualine Mau, NP 11/05/2022, 7:42 PM   I spent a total of {New HWYS:[id number]} {New Out-Pt:[id number]}  {Established Out-Pt:[id number]} in face to face in clinical consultation, greater than 50% of which was counseling/coordinating care for ***

## 2022-11-06 ENCOUNTER — Ambulatory Visit: Payer: No Typology Code available for payment source | Admitting: Orthopaedic Surgery

## 2022-11-06 ENCOUNTER — Ambulatory Visit (HOSPITAL_COMMUNITY)
Admission: RE | Admit: 2022-11-06 | Discharge: 2022-11-06 | Disposition: A | Discharge status: Home / Self Care | Payer: No Typology Code available for payment source | Source: Ambulatory Visit | Attending: Oncology | Admitting: Oncology

## 2022-11-06 DIAGNOSIS — Z8052 Family history of malignant neoplasm of bladder: Secondary | ICD-10-CM | POA: Diagnosis not present

## 2022-11-06 DIAGNOSIS — K219 Gastro-esophageal reflux disease without esophagitis: Secondary | ICD-10-CM | POA: Diagnosis not present

## 2022-11-06 DIAGNOSIS — Z8572 Personal history of non-Hodgkin lymphomas: Secondary | ICD-10-CM | POA: Insufficient documentation

## 2022-11-06 DIAGNOSIS — M4856XA Collapsed vertebra, not elsewhere classified, lumbar region, initial encounter for fracture: Secondary | ICD-10-CM | POA: Insufficient documentation

## 2022-11-06 DIAGNOSIS — C7951 Secondary malignant neoplasm of bone: Secondary | ICD-10-CM | POA: Insufficient documentation

## 2022-11-06 DIAGNOSIS — Z800 Family history of malignant neoplasm of digestive organs: Secondary | ICD-10-CM | POA: Insufficient documentation

## 2022-11-06 DIAGNOSIS — Z853 Personal history of malignant neoplasm of breast: Secondary | ICD-10-CM | POA: Diagnosis not present

## 2022-11-06 DIAGNOSIS — Z01818 Encounter for other preprocedural examination: Secondary | ICD-10-CM | POA: Diagnosis not present

## 2022-11-06 DIAGNOSIS — C3491 Malignant neoplasm of unspecified part of right bronchus or lung: Secondary | ICD-10-CM | POA: Insufficient documentation

## 2022-11-06 DIAGNOSIS — Z8781 Personal history of (healed) traumatic fracture: Secondary | ICD-10-CM

## 2022-11-06 DIAGNOSIS — M8458XA Pathological fracture in neoplastic disease, other specified site, initial encounter for fracture: Secondary | ICD-10-CM | POA: Diagnosis not present

## 2022-11-06 DIAGNOSIS — C801 Malignant (primary) neoplasm, unspecified: Secondary | ICD-10-CM | POA: Diagnosis not present

## 2022-11-06 DIAGNOSIS — E785 Hyperlipidemia, unspecified: Secondary | ICD-10-CM | POA: Insufficient documentation

## 2022-11-06 HISTORY — PX: IR KYPHO LUMBAR INC FX REDUCE BONE BX UNI/BIL CANNULATION INC/IMAGING: IMG5519

## 2022-11-06 HISTORY — PX: IR BONE TUMOR(S)RF ABLATION: IMG2284

## 2022-11-06 LAB — BASIC METABOLIC PANEL
Anion gap: 10 (ref 5–15)
BUN: 11 mg/dL (ref 8–23)
CO2: 29 mmol/L (ref 22–32)
Calcium: 8.3 mg/dL — ABNORMAL LOW (ref 8.9–10.3)
Chloride: 104 mmol/L (ref 98–111)
Creatinine, Ser: 1.19 mg/dL — ABNORMAL HIGH (ref 0.44–1.00)
GFR, Estimated: 47 mL/min — ABNORMAL LOW (ref >60)
Glucose, Bld: 108 mg/dL — ABNORMAL HIGH (ref 70–99)
Potassium: 3.5 mmol/L (ref 3.5–5.1)
Sodium: 143 mmol/L (ref 135–145)

## 2022-11-06 LAB — CBC
HCT: 40.7 % (ref 36.0–46.0)
Hemoglobin: 13.5 g/dL (ref 12.0–15.0)
MCH: 30.9 pg (ref 26.0–34.0)
MCHC: 33.2 g/dL (ref 30.0–36.0)
MCV: 93.1 fL (ref 80.0–100.0)
Platelets: 214 10*3/uL (ref 150–400)
RBC: 4.37 MIL/uL (ref 3.87–5.11)
RDW: 14.7 % (ref 11.5–15.5)
WBC: 4.7 10*3/uL (ref 4.0–10.5)
nRBC: 0 % (ref 0.0–0.2)

## 2022-11-06 LAB — PROTIME-INR
INR: 1 (ref 0.8–1.2)
Prothrombin Time: 13.4 seconds (ref 11.4–15.2)

## 2022-11-06 MED ORDER — FENTANYL CITRATE (PF) 100 MCG/2ML IJ SOLN
INTRAMUSCULAR | Status: AC
  Filled 2022-11-06: qty 4

## 2022-11-06 MED ORDER — HYDROMORPHONE HCL 1 MG/ML IJ SOLN: 1.0000 mg | INTRAMUSCULAR | Status: DC | PRN

## 2022-11-06 MED ORDER — OXYCODONE HCL 5 MG PO TABS: 10.0000 mg | ORAL_TABLET | ORAL | Status: DC | PRN

## 2022-11-06 MED ORDER — FENTANYL CITRATE (PF) 100 MCG/2ML IJ SOLN
INTRAMUSCULAR | Status: AC | PRN
  Administered 2022-11-06: 25 ug via INTRAVENOUS
  Administered 2022-11-06: 50 ug via INTRAVENOUS
  Administered 2022-11-06 (×3): 25 ug via INTRAVENOUS

## 2022-11-06 MED ORDER — LIDOCAINE-EPINEPHRINE 1 %-1:100000 IJ SOLN
INTRAMUSCULAR | Status: AC
  Administered 2022-11-06: 20 mL
  Filled 2022-11-06: qty 1

## 2022-11-06 MED ORDER — ONDANSETRON HCL 4 MG/2ML IJ SOLN
4.0000 mg | Freq: Once | INTRAMUSCULAR | Status: AC
  Administered 2022-11-06: 4 mg via INTRAVENOUS

## 2022-11-06 MED ORDER — HYDROMORPHONE HCL 1 MG/ML IJ SOLN
INTRAMUSCULAR | Status: AC | PRN
  Administered 2022-11-06: 1 mg via INTRAVENOUS

## 2022-11-06 MED ORDER — ONDANSETRON HCL 4 MG/2ML IJ SOLN
INTRAMUSCULAR | Status: AC | PRN
  Administered 2022-11-06: 4 mg via INTRAVENOUS

## 2022-11-06 MED ORDER — SODIUM CHLORIDE 0.9 % IV SOLN: INTRAVENOUS | Status: DC

## 2022-11-06 MED ORDER — ONDANSETRON HCL 4 MG/2ML IJ SOLN
INTRAMUSCULAR | Status: AC
  Filled 2022-11-06: qty 2

## 2022-11-06 MED ORDER — HYDROMORPHONE HCL 1 MG/ML IJ SOLN
INTRAMUSCULAR | Status: AC
  Filled 2022-11-06: qty 1

## 2022-11-06 MED ORDER — MIDAZOLAM HCL 2 MG/2ML IJ SOLN
INTRAMUSCULAR | Status: AC | PRN
  Administered 2022-11-06: 1 mg via INTRAVENOUS
  Administered 2022-11-06: .5 mg via INTRAVENOUS
  Administered 2022-11-06: 1 mg via INTRAVENOUS
  Administered 2022-11-06: .5 mg via INTRAVENOUS

## 2022-11-06 MED ORDER — BUPIVACAINE HCL (PF) 0.5 % IJ SOLN
INTRAMUSCULAR | Status: AC
  Administered 2022-11-06: 30 mL
  Filled 2022-11-06: qty 30

## 2022-11-06 MED ORDER — MIDAZOLAM HCL 2 MG/2ML IJ SOLN
INTRAMUSCULAR | Status: AC
  Filled 2022-11-06: qty 4

## 2022-11-06 NOTE — Procedures (Addendum)
Vascular and Interventional Radiology Procedure Note  Patient: Crystal [name] DOB: 1946-06-29 Medical Record Number: [id number] Note Date/Time: 11/06/22 6:43 PM   Performing Physician: Roanna Banning, MD Assistant(s): None  Diagnosis: Hx of NSCLC. Symptomatic L2 pathologic vertebral body fracture.  Procedure:  L2 BONE BIOPSY, PERCUTANEOUS RADIOFREQUENCY "OsteoCool" ABLATION  and VERTEBRAL BODY KYPHOPLASTY  Anesthesia: Conscious Sedation Complications: None Estimated Blood Loss: Minimal Specimens: Sent for Pathology  Findings:  Successful Fluoroscopy-guided L2 vertebral body biopsy, "OsteoCool" RFA and bipedicular kyphoplasty. A total of 4 mL PMMA was used. Hemostasis of the tract was achieved using Manual Pressure.  Plan: Bed rest for 2 hours.  See detailed procedure note with images in PACS. The patient tolerated the procedure well without incident or complication and was returned to Recovery in stable condition.    Roanna Banning, MD Vascular and Interventional Radiology Specialists [facility] Radiology   Pager. [phone] Clinic. [phone]

## 2022-11-07 ENCOUNTER — Inpatient Hospital Stay: Payer: No Typology Code available for payment source | Admitting: Nurse Practitioner

## 2022-11-07 ENCOUNTER — Telehealth (HOSPITAL_COMMUNITY): Payer: Self-pay | Admitting: Interventional Radiology

## 2022-11-07 ENCOUNTER — Encounter (HOSPITAL_COMMUNITY): Payer: Self-pay | Admitting: Interventional Radiology

## 2022-11-07 NOTE — Progress Notes (Signed)
Patient call to inform her that I did not give her the written discharge instructions for her procedure. Was not able to reach patient , sister [name] or sister Erskine Squibb. Discharge instructions were discussed with patient, [name] and Erskine Squibb prior to discharge.  11/07/2022 @ 08:45 Called patient to inform her that the discharge instructions can be located in her my Chart. As well as ask how she was doing since discharge Patient said "she make it home without vomiting  and was able to sleep all night all night." Was very thankful for everything I did for her and her sisters.

## 2022-11-07 NOTE — Progress Notes (Signed)
Vascular and Interventional Radiology  Phone Note  Patient: Crystal [name] DOB: August 28, 1946 Medical Record Number: [id number] Note Date/Time: 11/07/22 10:34 AM   Diagnosis: Met NSCLC w L2 bone metastasis.  I identified myself to the patient and conveyed my credentials to Crystal [name] For medical emergencies, Pt was advised to call 911 or go to the nearest emergency room.   Assessment  Plan: 77 y.o. year old female POD 1 s/p L2 Bone Biopsy, Percutaneous RFA "Osteocool" and Balloon Kyphoplasty Vertebral Augmentation.  VIR reached out in courtesy follow-up.   Pt reports that they are doing well. Denies any discomfort.  Is already up and walking. She reports to feel "great". Reports normal bladder function. No concern at this time.    Follow up Pt to follow up with me in Clinic within 1 month post op. She is scheduled to see Medical Oncology (Dr Truett Perna and Lonna Cobb, NP) this Friday 11/10/22.   As part of this Telephone encounter, no in-person exam was conducted. The patient was physically located in [place] or a state in which I am permitted to provide care.    Roanna Banning, MD Vascular and Interventional Radiology Specialists [facility] Radiology   Pager. [phone] Clinic. [phone]

## 2022-11-09 ENCOUNTER — Encounter: Payer: Self-pay | Admitting: *Deleted

## 2022-11-09 LAB — SURGICAL PATHOLOGY

## 2022-11-09 NOTE — Progress Notes (Signed)
                                                                                                                                                             Patient Name: Crystal [name] MRN: [id number] DOB: [birth year] Referring Physician: Thornton Papas (Profile Not Attached) Date of Service: 10/19/2022 [facility], [place]                                                        End Of Treatment Note  Diagnoses: C34.11-Malignant neoplasm of upper lobe, right bronchus or lung  Cancer Staging: 77 y.o. patient with L2 lumbar metastasis from adenocarcinoma of the right middle lung   Intent: Palliative  Radiation Treatment Dates: 10/05/2022 through 10/19/2022 Site Technique Total Dose (Gy) Dose per Fx (Gy) Completed Fx Beam Energies  Lumbar Spine: Spine 3D 30/30 3 10/10 10X, 15X   Narrative: The patient tolerated radiation therapy relatively well with only modest fatigue.  Plan: The patient will receive a call in about one month from the radiation oncology department. She will continue follow up with her medical oncologist, Dr. Truett Perna as well.  ------------------------------------------------   Margaretmary Dys, MD [facility]  Radiation Oncology Direct Dial: [phone]  Fax: [phone] [email or web address]  Skype  [email or web address]

## 2022-11-09 NOTE — Progress Notes (Signed)
Foundation One ordered on accession number [id number].

## 2022-11-10 ENCOUNTER — Telehealth: Payer: Self-pay | Admitting: *Deleted

## 2022-11-10 ENCOUNTER — Inpatient Hospital Stay (HOSPITAL_BASED_OUTPATIENT_CLINIC_OR_DEPARTMENT_OTHER): Payer: No Typology Code available for payment source | Admitting: Nurse Practitioner

## 2022-11-10 ENCOUNTER — Ambulatory Visit (HOSPITAL_BASED_OUTPATIENT_CLINIC_OR_DEPARTMENT_OTHER)
Admission: RE | Admit: 2022-11-10 | Discharge: 2022-11-10 | Disposition: A | Discharge status: Home / Self Care | Payer: No Typology Code available for payment source | Source: Ambulatory Visit | Attending: Nurse Practitioner | Admitting: Nurse Practitioner

## 2022-11-10 ENCOUNTER — Other Ambulatory Visit: Payer: Self-pay | Admitting: *Deleted

## 2022-11-10 ENCOUNTER — Encounter: Payer: Self-pay | Admitting: *Deleted

## 2022-11-10 VITALS — BP 132/87 | HR 100 | Temp 98.1°F | Resp 18 | Ht 64.0 in | Wt 214.0 lb

## 2022-11-10 DIAGNOSIS — J90 Pleural effusion, not elsewhere classified: Secondary | ICD-10-CM | POA: Diagnosis not present

## 2022-11-10 DIAGNOSIS — C3490 Malignant neoplasm of unspecified part of unspecified bronchus or lung: Secondary | ICD-10-CM | POA: Diagnosis not present

## 2022-11-10 DIAGNOSIS — C342 Malignant neoplasm of middle lobe, bronchus or lung: Secondary | ICD-10-CM

## 2022-11-10 DIAGNOSIS — C3491 Malignant neoplasm of unspecified part of right bronchus or lung: Secondary | ICD-10-CM | POA: Diagnosis not present

## 2022-11-10 DIAGNOSIS — J9811 Atelectasis: Secondary | ICD-10-CM | POA: Diagnosis not present

## 2022-11-10 DIAGNOSIS — I2699 Other pulmonary embolism without acute cor pulmonale: Secondary | ICD-10-CM | POA: Diagnosis not present

## 2022-11-10 DIAGNOSIS — I700 Atherosclerosis of aorta: Secondary | ICD-10-CM | POA: Diagnosis not present

## 2022-11-10 DIAGNOSIS — I2694 Multiple subsegmental pulmonary emboli without acute cor pulmonale: Secondary | ICD-10-CM | POA: Diagnosis not present

## 2022-11-10 DIAGNOSIS — R0602 Shortness of breath: Secondary | ICD-10-CM | POA: Diagnosis not present
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
## 2022-11-10 NOTE — Progress Notes (Signed)
PATIENT NAVIGATOR PROGRESS NOTE  Name: Crystal [name] Date: 11/10/2022 MRN: [id number]  DOB: April 19, 1946   Reason for visit:  F/U visit with Dr Truett Perna and Lonna Cobb NP  Comments:  Met with [name] and her sister during visit Referral entered for SW and specifically for advance directives Order for [facility] entered for 2 weeks Foundation one testing requested and sent yesterday    Time spent counseling/coordinating care: 30-45 minutes

## 2022-11-10 NOTE — Telephone Encounter (Signed)
Left VM asking when her next appointment is. Forwarded message to scheduling department.

## 2022-11-11 ENCOUNTER — Other Ambulatory Visit: Payer: Self-pay

## 2022-11-11 ENCOUNTER — Emergency Department (HOSPITAL_COMMUNITY): Payer: No Typology Code available for payment source

## 2022-11-11 ENCOUNTER — Inpatient Hospital Stay (HOSPITAL_COMMUNITY)
Admission: EM | Admit: 2022-11-11 | Discharge: 2022-11-18 | DRG: 166 | Disposition: A | Discharge status: Home / Self Care | Payer: No Typology Code available for payment source | Attending: Internal Medicine | Admitting: Internal Medicine

## 2022-11-11 DIAGNOSIS — C3490 Malignant neoplasm of unspecified part of unspecified bronchus or lung: Secondary | ICD-10-CM | POA: Insufficient documentation

## 2022-11-11 DIAGNOSIS — Z6836 Body mass index (BMI) 36.0-36.9, adult: Secondary | ICD-10-CM

## 2022-11-11 DIAGNOSIS — I2699 Other pulmonary embolism without acute cor pulmonale: Secondary | ICD-10-CM | POA: Diagnosis not present

## 2022-11-11 DIAGNOSIS — Z8572 Personal history of non-Hodgkin lymphomas: Secondary | ICD-10-CM

## 2022-11-11 DIAGNOSIS — Z923 Personal history of irradiation: Secondary | ICD-10-CM

## 2022-11-11 DIAGNOSIS — F419 Anxiety disorder, unspecified: Secondary | ICD-10-CM | POA: Insufficient documentation

## 2022-11-11 DIAGNOSIS — I130 Hypertensive heart and chronic kidney disease with heart failure and stage 1 through stage 4 chronic kidney disease, or unspecified chronic kidney disease: Secondary | ICD-10-CM | POA: Diagnosis present

## 2022-11-11 DIAGNOSIS — S2239XA Fracture of one rib, unspecified side, initial encounter for closed fracture: Secondary | ICD-10-CM | POA: Insufficient documentation

## 2022-11-11 DIAGNOSIS — Z888 Allergy status to other drugs, medicaments and biological substances status: Secondary | ICD-10-CM

## 2022-11-11 DIAGNOSIS — K219 Gastro-esophageal reflux disease without esophagitis: Secondary | ICD-10-CM | POA: Diagnosis present

## 2022-11-11 DIAGNOSIS — E669 Obesity, unspecified: Secondary | ICD-10-CM | POA: Diagnosis present

## 2022-11-11 DIAGNOSIS — N1831 Chronic kidney disease, stage 3a: Secondary | ICD-10-CM | POA: Insufficient documentation

## 2022-11-11 DIAGNOSIS — Z853 Personal history of malignant neoplasm of breast: Secondary | ICD-10-CM

## 2022-11-11 DIAGNOSIS — E7800 Pure hypercholesterolemia, unspecified: Secondary | ICD-10-CM | POA: Diagnosis present

## 2022-11-11 DIAGNOSIS — C3491 Malignant neoplasm of unspecified part of right bronchus or lung: Principal | ICD-10-CM | POA: Diagnosis present

## 2022-11-11 DIAGNOSIS — J9819 Other pulmonary collapse: Secondary | ICD-10-CM | POA: Diagnosis present

## 2022-11-11 DIAGNOSIS — Z885 Allergy status to narcotic agent status: Secondary | ICD-10-CM

## 2022-11-11 DIAGNOSIS — Z8249 Family history of ischemic heart disease and other diseases of the circulatory system: Secondary | ICD-10-CM

## 2022-11-11 DIAGNOSIS — I700 Atherosclerosis of aorta: Secondary | ICD-10-CM | POA: Diagnosis not present

## 2022-11-11 DIAGNOSIS — J90 Pleural effusion, not elsewhere classified: Secondary | ICD-10-CM | POA: Diagnosis not present

## 2022-11-11 DIAGNOSIS — I5022 Chronic systolic (congestive) heart failure: Secondary | ICD-10-CM | POA: Diagnosis present

## 2022-11-11 DIAGNOSIS — J9601 Acute respiratory failure with hypoxia: Secondary | ICD-10-CM | POA: Insufficient documentation

## 2022-11-11 DIAGNOSIS — N390 Urinary tract infection, site not specified: Secondary | ICD-10-CM | POA: Diagnosis present

## 2022-11-11 DIAGNOSIS — Z800 Family history of malignant neoplasm of digestive organs: Secondary | ICD-10-CM

## 2022-11-11 DIAGNOSIS — Z79899 Other long term (current) drug therapy: Secondary | ICD-10-CM

## 2022-11-11 DIAGNOSIS — R0602 Shortness of breath: Secondary | ICD-10-CM | POA: Diagnosis not present

## 2022-11-11 DIAGNOSIS — S2232XD Fracture of one rib, left side, subsequent encounter for fracture with routine healing: Secondary | ICD-10-CM

## 2022-11-11 DIAGNOSIS — J910 Malignant pleural effusion: Secondary | ICD-10-CM | POA: Insufficient documentation

## 2022-11-11 DIAGNOSIS — Z86711 Personal history of pulmonary embolism: Secondary | ICD-10-CM

## 2022-11-11 DIAGNOSIS — Z832 Family history of diseases of the blood and blood-forming organs and certain disorders involving the immune mechanism: Secondary | ICD-10-CM

## 2022-11-11 DIAGNOSIS — I82442 Acute embolism and thrombosis of left tibial vein: Secondary | ICD-10-CM | POA: Diagnosis present

## 2022-11-11 DIAGNOSIS — E876 Hypokalemia: Secondary | ICD-10-CM | POA: Diagnosis present

## 2022-11-11 DIAGNOSIS — N183 Chronic kidney disease, stage 3 unspecified: Secondary | ICD-10-CM | POA: Insufficient documentation

## 2022-11-11 DIAGNOSIS — Z8052 Family history of malignant neoplasm of bladder: Secondary | ICD-10-CM

## 2022-11-11 DIAGNOSIS — C342 Malignant neoplasm of middle lobe, bronchus or lung: Secondary | ICD-10-CM

## 2022-11-11 DIAGNOSIS — I5032 Chronic diastolic (congestive) heart failure: Secondary | ICD-10-CM | POA: Insufficient documentation

## 2022-11-11 DIAGNOSIS — R319 Hematuria, unspecified: Secondary | ICD-10-CM | POA: Diagnosis present

## 2022-11-11 DIAGNOSIS — I2694 Multiple subsegmental pulmonary emboli without acute cor pulmonale: Secondary | ICD-10-CM | POA: Diagnosis not present

## 2022-11-11 DIAGNOSIS — I10 Essential (primary) hypertension: Secondary | ICD-10-CM | POA: Diagnosis present

## 2022-11-11 DIAGNOSIS — Z823 Family history of stroke: Secondary | ICD-10-CM

## 2022-11-11 DIAGNOSIS — Z66 Do not resuscitate: Secondary | ICD-10-CM | POA: Diagnosis present

## 2022-11-11 DIAGNOSIS — J9811 Atelectasis: Secondary | ICD-10-CM | POA: Diagnosis not present

## 2022-11-11 LAB — BASIC METABOLIC PANEL
Anion gap: 10 (ref 5–15)
BUN: 14 mg/dL (ref 8–23)
CO2: 26 mmol/L (ref 22–32)
Calcium: 8.1 mg/dL — ABNORMAL LOW (ref 8.9–10.3)
Chloride: 103 mmol/L (ref 98–111)
Creatinine, Ser: 1.18 mg/dL — ABNORMAL HIGH (ref 0.44–1.00)
GFR, Estimated: 48 mL/min — ABNORMAL LOW (ref >60)
Glucose, Bld: 121 mg/dL — ABNORMAL HIGH (ref 70–99)
Potassium: 3.1 mmol/L — ABNORMAL LOW (ref 3.5–5.1)
Sodium: 139 mmol/L (ref 135–145)

## 2022-11-11 LAB — CBC WITH DIFFERENTIAL/PLATELET
Abs Immature Granulocytes: 0.01 10*3/uL (ref 0.00–0.07)
Basophils Absolute: 0 10*3/uL (ref 0.0–0.1)
Basophils Relative: 1 %
Eosinophils Absolute: 0.1 10*3/uL (ref 0.0–0.5)
Eosinophils Relative: 1 %
HCT: 40.4 % (ref 36.0–46.0)
Hemoglobin: 13.6 g/dL (ref 12.0–15.0)
Immature Granulocytes: 0 %
Lymphocytes Relative: 26 %
Lymphs Abs: 1.4 10*3/uL (ref 0.7–4.0)
MCH: 31.1 pg (ref 26.0–34.0)
MCHC: 33.7 g/dL (ref 30.0–36.0)
MCV: 92.2 fL (ref 80.0–100.0)
Monocytes Absolute: 0.4 10*3/uL (ref 0.1–1.0)
Monocytes Relative: 8 %
Neutro Abs: 3.5 10*3/uL (ref 1.7–7.7)
Neutrophils Relative %: 64 %
Platelets: 244 10*3/uL (ref 150–400)
RBC: 4.38 MIL/uL (ref 3.87–5.11)
RDW: 14.8 % (ref 11.5–15.5)
WBC: 5.4 10*3/uL (ref 4.0–10.5)
nRBC: 0 % (ref 0.0–0.2)

## 2022-11-11 LAB — BRAIN NATRIURETIC PEPTIDE: B Natriuretic Peptide: 160 pg/mL — ABNORMAL HIGH (ref 0.0–100.0)

## 2022-11-11 LAB — D-DIMER, QUANTITATIVE: D-Dimer, Quant: 8.02 ug/mL-FEU — ABNORMAL HIGH (ref 0.00–0.50)

## 2022-11-11 MED ORDER — HEPARIN (PORCINE) 25000 UT/250ML-% IV SOLN
1300.0000 [IU]/h | INTRAVENOUS | Status: DC
  Administered 2022-11-12: 1300 [IU]/h via INTRAVENOUS
  Filled 2022-11-11: qty 250

## 2022-11-11 MED ORDER — IOHEXOL 350 MG/ML SOLN
55.0000 mL | Freq: Once | INTRAVENOUS | Status: AC | PRN
  Administered 2022-11-11: 55 mL via INTRAVENOUS

## 2022-11-11 MED ORDER — POTASSIUM CHLORIDE CRYS ER 20 MEQ PO TBCR
40.0000 meq | EXTENDED_RELEASE_TABLET | Freq: Once | ORAL | Status: AC
  Administered 2022-11-11: 40 meq via ORAL
  Filled 2022-11-11: qty 2

## 2022-11-11 MED ORDER — HEPARIN BOLUS VIA INFUSION
4000.0000 [IU] | Freq: Once | INTRAVENOUS | Status: AC
  Administered 2022-11-12: 4000 [IU] via INTRAVENOUS
  Filled 2022-11-11: qty 4000

## 2022-11-11 NOTE — ED Notes (Signed)
Patient transported to X-ray 

## 2022-11-11 NOTE — ED Notes (Signed)
The patient has returned from xray

## 2022-11-11 NOTE — ED Notes (Signed)
Patient transported to CT 

## 2022-11-11 NOTE — ED Notes (Signed)
[place], PA at bedside 
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
## 2022-11-11 NOTE — Progress Notes (Signed)
ANTICOAGULATION CONSULT NOTE - Initial Consult  Pharmacy Consult for heparin Indication: pulmonary embolus  Allergies  Allergen Reactions   Simvastatin     Leg cramps   Zoloft [Sertraline] Nausea Only   Codeine Other (See Comments)    Bad Headaches   Coreg Other (See Comments)    "Made my legs hurt"   Lisinopril Cough   Tizanidine Hcl Rash    hypotension    Patient Measurements: Height: 5\' 4"  (162.6 cm) Weight: 97.1 kg (214 lb) IBW/kg (Calculated) : 54.7 Heparin Dosing Weight: 80kg  Vital Signs: Temp: 98.2 F (36.8 C) (01/20 2037) BP: 136/91 (01/20 2230) Pulse Rate: 88 (01/20 2230)  Labs: Recent Labs    11/11/22 2103  HGB 13.6  HCT 40.4  PLT 244  CREATININE 1.18*    Estimated Creatinine Clearance: 45.2 mL/min (A) (by C-G formula based on SCr of 1.18 mg/dL (H)).   Medical History: Past Medical History:  Diagnosis Date   Anxiety    Atherosclerosis of aorta (HCC)    Breast cancer (HCC) 1998   (Rt) lumpectomy dx 1999; Dr. 2000   Dyslipidemia, goal LDL below 130    Essential hypertension    GERD (gastroesophageal reflux disease)    Hypercholesterolemia    IBS (irritable bowel syndrome)    Lymphoma (HCC)    lymphoma dx 11/28/10 - left neck   non hodgkins lymphoma 12/2010   right aprotid gland   Nonischemic cardiomyopathy (HCC) 2008   ? Doxorubincin induced; essentially resolved as of echo in January 2014, current EF 50-55%. Grade 1 diastolic dysfunction.   Obesity    Severe obesity (BMI >= 40) (HCC) 05/21/2013   Improve to BMI of 39 by July 2015   Tremor     Assessment: 77yo female undergoing radiation for lung ca c/o CP and SOB x2d, CT reveals PE w/ small clot burden >> to begin heparin.  Goal of Therapy:  Heparin level 0.3-0.7 units/ml Monitor platelets by anticoagulation protocol: Yes   Plan:  Heparin 4000 units IV bolus x1 followed by infusion at 1300 units/hr. Monitor heparin levels and CBC.  77yo, PharmD, BCPS  11/11/2022,11:53  PM

## 2022-11-11 NOTE — ED Triage Notes (Signed)
Pt via POV c/o CP and SOB for the past two days. Was seen at [facility] yesterday but did not complete her evaluation. Pt states s/s similar to when she has had to have fluid removed before. Currently undergoing radiation treatment for lung cancer.

## 2022-11-12 ENCOUNTER — Observation Stay (HOSPITAL_COMMUNITY): Payer: No Typology Code available for payment source

## 2022-11-12 ENCOUNTER — Encounter (HOSPITAL_COMMUNITY): Payer: Self-pay | Admitting: Internal Medicine

## 2022-11-12 ENCOUNTER — Observation Stay (HOSPITAL_BASED_OUTPATIENT_CLINIC_OR_DEPARTMENT_OTHER): Payer: No Typology Code available for payment source

## 2022-11-12 DIAGNOSIS — I2699 Other pulmonary embolism without acute cor pulmonale: Secondary | ICD-10-CM | POA: Diagnosis present

## 2022-11-12 DIAGNOSIS — J90 Pleural effusion, not elsewhere classified: Secondary | ICD-10-CM | POA: Diagnosis not present

## 2022-11-12 DIAGNOSIS — I5022 Chronic systolic (congestive) heart failure: Secondary | ICD-10-CM | POA: Insufficient documentation

## 2022-11-12 DIAGNOSIS — J9601 Acute respiratory failure with hypoxia: Secondary | ICD-10-CM | POA: Insufficient documentation

## 2022-11-12 DIAGNOSIS — J910 Malignant pleural effusion: Secondary | ICD-10-CM | POA: Insufficient documentation

## 2022-11-12 DIAGNOSIS — I2694 Multiple subsegmental pulmonary emboli without acute cor pulmonale: Secondary | ICD-10-CM

## 2022-11-12 DIAGNOSIS — S2239XA Fracture of one rib, unspecified side, initial encounter for closed fracture: Secondary | ICD-10-CM | POA: Insufficient documentation

## 2022-11-12 DIAGNOSIS — Z853 Personal history of malignant neoplasm of breast: Secondary | ICD-10-CM | POA: Diagnosis not present

## 2022-11-12 DIAGNOSIS — N1831 Chronic kidney disease, stage 3a: Secondary | ICD-10-CM | POA: Insufficient documentation

## 2022-11-12 DIAGNOSIS — C3490 Malignant neoplasm of unspecified part of unspecified bronchus or lung: Secondary | ICD-10-CM | POA: Insufficient documentation

## 2022-11-12 DIAGNOSIS — F419 Anxiety disorder, unspecified: Secondary | ICD-10-CM | POA: Insufficient documentation

## 2022-11-12 DIAGNOSIS — I5032 Chronic diastolic (congestive) heart failure: Secondary | ICD-10-CM | POA: Insufficient documentation

## 2022-11-12 DIAGNOSIS — N183 Chronic kidney disease, stage 3 unspecified: Secondary | ICD-10-CM | POA: Insufficient documentation

## 2022-11-12 LAB — COMPREHENSIVE METABOLIC PANEL
ALT: 9 U/L (ref 0–44)
AST: 19 U/L (ref 15–41)
Albumin: 2.6 g/dL — ABNORMAL LOW (ref 3.5–5.0)
Alkaline Phosphatase: 54 U/L (ref 38–126)
Anion gap: 10 (ref 5–15)
BUN: 12 mg/dL (ref 8–23)
CO2: 26 mmol/L (ref 22–32)
Calcium: 7.9 mg/dL — ABNORMAL LOW (ref 8.9–10.3)
Chloride: 102 mmol/L (ref 98–111)
Creatinine, Ser: 1.13 mg/dL — ABNORMAL HIGH (ref 0.44–1.00)
GFR, Estimated: 50 mL/min — ABNORMAL LOW (ref >60)
Glucose, Bld: 96 mg/dL (ref 70–99)
Potassium: 3.4 mmol/L — ABNORMAL LOW (ref 3.5–5.1)
Sodium: 138 mmol/L (ref 135–145)
Total Bilirubin: 0.5 mg/dL (ref 0.3–1.2)
Total Protein: 5.9 g/dL — ABNORMAL LOW (ref 6.5–8.1)

## 2022-11-12 LAB — ECHOCARDIOGRAM COMPLETE
AR max vel: 2.08 cm2
AV Area VTI: 2.05 cm2
AV Area mean vel: 2.01 cm2
AV Mean grad: 4 mmHg
AV Peak grad: 7.7 mmHg
Ao pk vel: 1.39 m/s
Area-P 1/2: 5.54 cm2
Calc EF: 47.6 %
Height: 64 in
S' Lateral: 3.9 cm
Single Plane A2C EF: 49.7 %
Single Plane A4C EF: 48.7 %
Weight: 3424 oz

## 2022-11-12 LAB — MAGNESIUM: Magnesium: 1.6 mg/dL — ABNORMAL LOW (ref 1.7–2.4)

## 2022-11-12 LAB — HEPARIN LEVEL (UNFRACTIONATED): Heparin Unfractionated: 0.91 IU/mL — ABNORMAL HIGH (ref 0.30–0.70)

## 2022-11-12 LAB — TROPONIN I (HIGH SENSITIVITY)
Troponin I (High Sensitivity): 12 ng/L
Troponin I (High Sensitivity): 13 ng/L (ref <18)

## 2022-11-12 MED ORDER — DIPHENHYDRAMINE HCL 25 MG PO CAPS: 50.0000 mg | ORAL_CAPSULE | Freq: Every evening | ORAL | Status: DC | PRN

## 2022-11-12 MED ORDER — PERFLUTREN LIPID MICROSPHERE
1.0000 mL | INTRAVENOUS | Status: DC | PRN
  Administered 2022-11-12: 3 mL via INTRAVENOUS

## 2022-11-12 MED ORDER — APIXABAN 5 MG PO TABS: 5.0000 mg | ORAL_TABLET | Freq: Two times a day (BID) | ORAL | Status: DC

## 2022-11-12 MED ORDER — MELATONIN 3 MG PO TABS: 3.0000 mg | ORAL_TABLET | Freq: Every evening | ORAL | Status: DC | PRN

## 2022-11-12 MED ORDER — MELATONIN 3 MG PO TABS: 3.0000 mg | ORAL_TABLET | Freq: Every day | ORAL | Status: DC

## 2022-11-12 MED ORDER — LIDOCAINE HCL (PF) 1 % IJ SOLN: 5.0000 mL | Freq: Once | INTRAMUSCULAR | Status: DC

## 2022-11-12 MED ORDER — LIDOCAINE HCL (PF) 1 % IJ SOLN
INTRAMUSCULAR | Status: AC
  Filled 2022-11-12: qty 30

## 2022-11-12 MED ORDER — METOPROLOL TARTRATE 50 MG PO TABS
50.0000 mg | ORAL_TABLET | Freq: Two times a day (BID) | ORAL | Status: DC
  Administered 2022-11-12 – 2022-11-13 (×4): 50 mg via ORAL
  Filled 2022-11-12 (×2): qty 1
  Filled 2022-11-12: qty 2
  Filled 2022-11-12 (×2): qty 1

## 2022-11-12 MED ORDER — TRAMADOL HCL 50 MG PO TABS: 50.0000 mg | ORAL_TABLET | Freq: Four times a day (QID) | ORAL | Status: DC | PRN

## 2022-11-12 MED ORDER — APIXABAN 5 MG PO TABS
10.0000 mg | ORAL_TABLET | Freq: Two times a day (BID) | ORAL | Status: DC
  Administered 2022-11-12 – 2022-11-14 (×5): 10 mg via ORAL
  Filled 2022-11-12 (×5): qty 2

## 2022-11-12 MED ORDER — ACETAMINOPHEN 650 MG RE SUPP: 650.0000 mg | Freq: Four times a day (QID) | RECTAL | Status: DC | PRN

## 2022-11-12 MED ORDER — LORAZEPAM 0.5 MG PO TABS
0.5000 mg | ORAL_TABLET | Freq: Two times a day (BID) | ORAL | Status: DC | PRN
  Administered 2022-11-13 – 2022-11-17 (×4): 0.5 mg via ORAL
  Filled 2022-11-12 (×4): qty 1

## 2022-11-12 MED ORDER — MAGNESIUM SULFATE 2 GM/50ML IV SOLN
2.0000 g | Freq: Once | INTRAVENOUS | Status: AC
  Administered 2022-11-12: 2 g via INTRAVENOUS
  Filled 2022-11-12: qty 50

## 2022-11-12 MED ORDER — OXYCODONE HCL 5 MG PO TABS
5.0000 mg | ORAL_TABLET | Freq: Four times a day (QID) | ORAL | Status: DC | PRN
  Administered 2022-11-16: 5 mg via ORAL
  Filled 2022-11-12: qty 1

## 2022-11-12 MED ORDER — POTASSIUM CHLORIDE CRYS ER 20 MEQ PO TBCR
40.0000 meq | EXTENDED_RELEASE_TABLET | Freq: Two times a day (BID) | ORAL | Status: AC
  Administered 2022-11-12 – 2022-11-13 (×3): 40 meq via ORAL
  Filled 2022-11-12 (×3): qty 2

## 2022-11-12 MED ORDER — LORAZEPAM 1 MG PO TABS
0.5000 mg | ORAL_TABLET | Freq: Once | ORAL | Status: AC
  Administered 2022-11-12: 0.5 mg via ORAL
  Filled 2022-11-12: qty 1

## 2022-11-12 MED ORDER — ACETAMINOPHEN 325 MG PO TABS: 650.0000 mg | ORAL_TABLET | Freq: Four times a day (QID) | ORAL | Status: DC | PRN

## 2022-11-12 NOTE — Progress Notes (Signed)
Patient received from ED to [facility]  with stable vital signs and 2L [place]. Pt is alert and oriented

## 2022-11-12 NOTE — Discharge Instructions (Addendum)
   _______________________________________________________________________________________________________________________________________ Information on my medicine - ELIQUIS (apixaban)  This medication education was reviewed with me or my healthcare representative as part of my discharge preparation.   Why was Eliquis prescribed for you? Eliquis was prescribed to treat blood clots that may have been found in the veins of your legs (deep vein thrombosis) or in your lungs (pulmonary embolism) and to reduce the risk of them occurring again.  What do You need to know about Eliquis ? The starting dose is 10 mg (two 5 mg tablets) taken TWICE daily for the FIRST SEVEN (7) DAYS, then on 11/19/22  the dose is reduced to ONE 5 mg tablet taken TWICE daily.  Eliquis may be taken with or without food.   Try to take the dose about the same time in the morning and in the evening. If you have difficulty swallowing the tablet whole please discuss with your pharmacist how to take the medication safely.  Take Eliquis exactly as prescribed and DO NOT stop taking Eliquis without talking to the doctor who prescribed the medication.  Stopping may increase your risk of developing a new blood clot.  Refill your prescription before you run out.  After discharge, you should have regular check-up appointments with your healthcare provider that is prescribing your Eliquis.    What do you do if you miss a dose? If a dose of ELIQUIS is not taken at the scheduled time, take it as soon as possible on the same day and twice-daily administration should be resumed. The dose should not be doubled to make up for a missed dose.  Important Safety Information A possible side effect of Eliquis is bleeding. You should call your healthcare provider right away if you experience any of the following: Bleeding from an injury or your nose that does not stop. Unusual colored urine (red or dark brown) or unusual colored stools  (red or black). Unusual bruising for unknown reasons. A serious fall or if you hit your head (even if there is no bleeding).  Some medicines may interact with Eliquis and might increase your risk of bleeding or clotting while on Eliquis. To help avoid this, consult your healthcare provider or pharmacist prior to using any new prescription or non-prescription medications, including herbals, vitamins, non-steroidal anti-inflammatory drugs (NSAIDs) and supplements.  This website has more information on Eliquis (apixaban): http://www.eliquis.com/eliquis/home

## 2022-11-12 NOTE — Procedures (Signed)
PROCEDURE SUMMARY:  Successful US guided therapeutic right thoracentesis. Yielded 800 cc of clear, yellow fluid. Pt tolerated procedure well. No immediate complications.  Specimen not sent for labs. CXR ordered.  EBL < 1 mL  Shon Hough, AGNP 11/12/2022 9:34 AM

## 2022-11-12 NOTE — ED Notes (Signed)
The patient is requesting medication to help her sleep as well as asking for something to drink. ER PA informed via epic secure chat. Awaiting response.

## 2022-11-12 NOTE — Progress Notes (Addendum)
ANTICOAGULATION CONSULT NOTE - Initial Consult  Pharmacy Consult for Apixaban Indication: pulmonary embolus  Allergies  Allergen Reactions   Simvastatin     Leg cramps   Zoloft [Sertraline] Nausea Only   Codeine Other (See Comments)    Bad Headaches   Coreg Other (See Comments)    "Made my legs hurt"   Lisinopril Cough   Tizanidine Hcl Rash    hypotension    Patient Measurements: Height: 5\' 4"  (162.6 cm) Weight: 97.1 kg (214 lb) IBW/kg (Calculated) : 54.7 Heparin Dosing Weight: 80kg  Vital Signs: Temp: 97.7 F (36.5 C) (01/21 0800) Temp Source: Oral (01/21 0800) BP: 154/81 (01/21 0800) Pulse Rate: 101 (01/21 0800)  Labs: Recent Labs    11/11/22 2103 11/12/22 0003 11/12/22 0653  HGB 13.6  --   --   HCT 40.4  --   --   PLT 244  --   --   HEPARINUNFRC  --   --  0.91*  CREATININE 1.18*  --  1.13*  TROPONINIHS  --  13 12     Estimated Creatinine Clearance: 47.2 mL/min (A) (by C-G formula based on SCr of 1.13 mg/dL (H)).   Medical History: Past Medical History:  Diagnosis Date   Anxiety    Atherosclerosis of aorta (HCC)    Breast cancer (HCC) 1998   (Rt) lumpectomy dx 1999; Dr. 2000   Dyslipidemia, goal LDL below 130    Essential hypertension    GERD (gastroesophageal reflux disease)    Hypercholesterolemia    IBS (irritable bowel syndrome)    Lymphoma (HCC)    lymphoma dx 11/28/10 - left neck   non hodgkins lymphoma 12/2010   right aprotid gland   Nonischemic cardiomyopathy (HCC) 2008   ? Doxorubincin induced; essentially resolved as of echo in January 2014, current EF 50-55%. Grade 1 diastolic dysfunction.   Obesity    Severe obesity (BMI >= 40) (HCC) 05/21/2013   Improve to BMI of 39 by July 2015   Tremor     Assessment: 77yo female undergoing radiation for lung ca c/o CP and SOB x2d, CT reveals PE w/ small clot burden. Pharmacy consulted to dose and manage heparin infusion for acute PE on 1/20. Pharmacy consulted to transition pt to apixaban  on 11/12/22.   Heparin level 0.91, supratherapeutic (6.5 hour level) Current heparin infusion rate: 1300 units/hr  Hgb 13.6, Plt 244 from admission No s/sx of bleeding per RN  Goal of Therapy:  Heparin level 0.3-0.7 units/ml Monitor platelets by anticoagulation protocol: Yes   Plan:  Discontinue heparin infusion Start apixaban 1 hour after heparin infusion d/c'd Apixaban 10mg  BID x7 days, followed by apixaban 5mg  BID thereafter Monitor daily CBC and for s/sx of bleeding  11/14/22, PharmD, BCPS Clinical Pharmacist 11/12/2022 9:37 AM   Please refer to [facility]ION for pharmacy phone number

## 2022-11-12 NOTE — ED Notes (Signed)
Patient transported to Ultrasound 

## 2022-11-12 NOTE — Progress Notes (Signed)
  Echocardiogram 2D Echocardiogram has been performed.  Crystal [name] 11/12/2022, 12:38 PM

## 2022-11-12 NOTE — Progress Notes (Signed)
Lower extremity venous has been completed.   Preliminary results in CV Proc.   Aundra Millet Joli Koob 11/12/2022 10:47 AM

## 2022-11-12 NOTE — ED Notes (Signed)
Pt returned from US

## 2022-11-12 NOTE — ED Notes (Signed)
ED TO INPATIENT HANDOFF REPORT  ED Nurse Name and Phone #: Massie Maroon RN [phone]  S Name/Age/Gender Crystal [name] 77 y.o. female Room/Bed: 039C/039C  Code Status   Code Status: DNR  Home/SNF/Other Home Patient oriented to: self, place, time, and situation Is this baseline? Yes   Triage Complete: Triage complete  Chief Complaint Acute pulmonary embolism ([place]) [I26.99]  Triage Note Pt via POV c/o CP and SOB for the past two days. Was seen at [facility] yesterday but did not complete her evaluation. Pt states s/s similar to when she has had to have fluid removed before. Currently undergoing radiation treatment for lung cancer.    Allergies Allergies  Allergen Reactions   Simvastatin     Leg cramps   Zoloft [Sertraline] Nausea Only   Codeine Other (See Comments)    Bad Headaches   Coreg Other (See Comments)    "Made my legs hurt"   Lisinopril Cough   Tizanidine Hcl Rash    hypotension    Level of Care/Admitting Diagnosis ED Disposition     ED Disposition  Admit   Condition  --   Comment  Hospital Area: [facility] [[id number]]  Level of Care: Med-Surg [16]  May place patient in observation at [facility] or [facility] if equivalent level of care is available:: No  Covid Evaluation: Asymptomatic - no recent exposure (last 10 days) testing not required  Diagnosis: Acute pulmonary embolism [facility]) [[id number]]  Admitting Physician: Thereasa Solo, JEFFREY T [[id number]]  Attending Physician: Thereasa Solo, JEFFREY T [[id number]]          B Medical/Surgery History Past Medical History:  Diagnosis Date   Anxiety    Atherosclerosis of aorta ([place])    Breast cancer ([place]) 1998   (Rt) lumpectomy dx 1999; Dr. Benay Spice   Dyslipidemia, goal LDL below 130    Essential hypertension    GERD (gastroesophageal reflux disease)    Hypercholesterolemia    IBS (irritable bowel syndrome)    Lymphoma ([place])    lymphoma dx 11/28/10 - left neck   non hodgkins lymphoma 12/2010   right  aprotid gland   Nonischemic cardiomyopathy ([place]) 2008   ? Doxorubincin induced; essentially resolved as of echo in January 2014, current EF 50-55%. Grade 1 diastolic dysfunction.   Obesity    Severe obesity (BMI >= 40) ([place]) 05/21/2013   Improve to BMI of 39 by July 2015   Tremor    Past Surgical History:  Procedure Laterality Date   ABDOMINAL HYSTERECTOMY     BSO   ANKLE FRACTURE SURGERY Left    BREAST EXCISIONAL BIOPSY Left    BREAST EXCISIONAL BIOPSY Right    BREAST LUMPECTOMY Right 1998   BREAST LUMPECTOMY Right 08/25/2020   Procedure: RIGHT BREAST LUMPECTOMY;  Surgeon: Coralie Keens, MD;  Location: [facility];  Service: General;  Laterality: Right;   CHOLECYSTECTOMY N/A 06/09/2020   Procedure: LAPAROSCOPIC CHOLECYSTECTOMY;  Surgeon: Coralie Keens, MD;  Location: [facility];  Service: General;  Laterality: N/A;   COLONOSCOPY     IR BONE TUMOR(S)RF ABLATION  11/06/2022   IR KYPHO LUMBAR INC FX REDUCE BONE BX UNI/BIL CANNULATION INC/IMAGING  11/06/2022   IR RADIOLOGIST EVAL & MGMT  10/26/2022   LEFT HEART CATH AND CORONARY ANGIOGRAPHY  12/2007   None coronary disease, EF 45% (up from nuclear study EF of 30% in June '08)   NM MYOVIEW LTD  03/29/2007   EF 33%,NEGATIVE ISCHEMIA, prob need cath   [place]  05/01/2014   Lexiscan: EF 55%. Normal wall motion; no ischemia or infarction; apical thinning   PORTACATH PLACEMENT  08/25/2013   rt. with tip in cavoatrial junction, Dr.Yamagata    TRANSTHORACIC ECHOCARDIOGRAM  05/01/2014   Normal LV size with low normal function. EF of 50-55% and Gr 1 DD; aortic sclerosis without stenosis. MAC and thickening/calcification of the anterior leaflet - no notable AI / AS or MR/MS     A IV Location/Drains/Wounds Patient Lines/Drains/Airways Status     Active Line/Drains/Airways     Name Placement date Placement time Site Days   Peripheral IV 11/11/22 20 G Left Antecubital 11/11/22  2101  Antecubital  1   Peripheral IV  11/12/22 22 G Anterior;Left Forearm 11/12/22  0005  Forearm  less than 1   Incision - 4 Ports Abdomen Left;Medial Mid;Upper Right;Upper Right;Lower 06/09/20  1259  -- 886            Intake/Output Last 24 hours  Intake/Output Summary (Last 24 hours) at 11/12/2022 1314 Last data filed at 11/12/2022 0354 Gross per 24 hour  Intake 85.91 ml  Output --  Net 85.91 ml    Labs/Imaging Results for orders placed or performed during the hospital encounter of 11/11/22 (from the past 48 hour(s))  Basic metabolic panel     Status: Abnormal   Collection Time: 11/11/22  9:03 PM  Result Value Ref Range   Sodium 139 135 - 145 mmol/L   Potassium 3.1 (L) 3.5 - 5.1 mmol/L   Chloride 103 98 - 111 mmol/L   CO2 26 22 - 32 mmol/L   Glucose, Bld 121 (H) 70 - 99 mg/dL    Comment: Glucose reference range applies only to samples taken after fasting for at least 8 hours.   BUN 14 8 - 23 mg/dL   Creatinine, Ser 1.18 (H) 0.44 - 1.00 mg/dL   Calcium 8.1 (L) 8.9 - 10.3 mg/dL   GFR, Estimated 48 (L) >60 mL/min    Comment: (NOTE) Calculated using the CKD-EPI Creatinine Equation (2021)    Anion gap 10 5 - 15    Comment: Performed at [facility] [address] [place], [place] [address]  CBC with Differential     Status: None   Collection Time: 11/11/22  9:03 PM  Result Value Ref Range   WBC 5.4 4.0 - 10.5 K/uL   RBC 4.38 3.87 - 5.11 MIL/uL   Hemoglobin 13.6 12.0 - 15.0 g/dL   HCT 40.4 36.0 - 46.0 %   MCV 92.2 80.0 - 100.0 fL   MCH 31.1 26.0 - 34.0 pg   MCHC 33.7 30.0 - 36.0 g/dL   RDW 14.8 11.5 - 15.5 %   Platelets 244 150 - 400 K/uL   nRBC 0.0 0.0 - 0.2 %   Neutrophils Relative % 64 %   Neutro Abs 3.5 1.7 - 7.7 K/uL   Lymphocytes Relative 26 %   Lymphs Abs 1.4 0.7 - 4.0 K/uL   Monocytes Relative 8 %   Monocytes Absolute 0.4 0.1 - 1.0 K/uL   Eosinophils Relative 1 %   Eosinophils Absolute 0.1 0.0 - 0.5 K/uL   Basophils Relative 1 %   Basophils Absolute 0.0 0.0 - 0.1 K/uL   Immature  Granulocytes 0 %   Abs Immature Granulocytes 0.01 0.00 - 0.07 K/uL    Comment: Performed at [facility], [address] [place], [place] [address]  Brain natriuretic peptide     Status: Abnormal  Collection Time: 11/11/22  9:03 PM  Result Value Ref Range   B Natriuretic Peptide 160.0 (H) 0.0 - 100.0 pg/mL    Comment: Performed at [facility] [address], [place], [place] [address]  D-dimer, quantitative     Status: Abnormal   Collection Time: 11/11/22  9:03 PM  Result Value Ref Range   D-Dimer, Quant 8.02 (H) 0.00 - 0.50 ug/mL-FEU    Comment: (NOTE) At the manufacturer cut-off value of 0.5 g/mL FEU, this assay has a negative predictive value of 95-100%.This assay is intended for use in conjunction with a clinical pretest probability (PTP) assessment model to exclude pulmonary embolism (PE) and deep venous thrombosis (DVT) in outpatients suspected of PE or DVT. Results should be correlated with clinical presentation. Performed at [facility], [place] [address], [place], [place] [address]   Troponin I (High Sensitivity)     Status: None   Collection Time: 11/12/22 12:03 AM  Result Value Ref Range   Troponin I (High Sensitivity) 13 <18 ng/L    Comment: (NOTE) Elevated high sensitivity troponin I (hsTnI) values and significant  changes across serial measurements may suggest ACS but many other  chronic and acute conditions are known to elevate hsTnI results.  Refer to the "Links" section for chest pain algorithms and additional  guidance. Performed at [facility], [place] [address] [place], [place] [address]   Heparin level (unfractionated)     Status: Abnormal   Collection Time: 11/12/22  6:53 AM  Result Value Ref Range   Heparin Unfractionated 0.91 (H) 0.30 - 0.70 IU/mL    Comment: (NOTE) The clinical reportable range upper limit is being lowered to >1.10 to align with the FDA approved guidance for the current laboratory assay.  If heparin  results are below expected values, and patient dosage has  been confirmed, suggest follow up testing of antithrombin III levels. Performed at [facility], [place] [address] [place], [place] [address]   Troponin I (High Sensitivity)     Status: None   Collection Time: 11/12/22  6:53 AM  Result Value Ref Range   Troponin I (High Sensitivity) 12 <18 ng/L    Comment: (NOTE) Elevated high sensitivity troponin I (hsTnI) values and significant  changes across serial measurements may suggest ACS but many other  chronic and acute conditions are known to elevate hsTnI results.  Refer to the "Links" section for chest pain algorithms and additional  guidance. Performed at [facility], [place] [address], [place], [place] [address]   Comprehensive metabolic panel     Status: Abnormal   Collection Time: 11/12/22  6:53 AM  Result Value Ref Range   Sodium 138 135 - 145 mmol/L   Potassium 3.4 (L) 3.5 - 5.1 mmol/L    Comment: HEMOLYSIS AT THIS LEVEL MAY AFFECT RESULT   Chloride 102 98 - 111 mmol/L   CO2 26 22 - 32 mmol/L   Glucose, Bld 96 70 - 99 mg/dL    Comment: Glucose reference range applies only to samples taken after fasting for at least 8 hours.   BUN 12 8 - 23 mg/dL   Creatinine, Ser 1.13 (H) 0.44 - 1.00 mg/dL   Calcium 7.9 (L) 8.9 - 10.3 mg/dL   Total Protein 5.9 (L) 6.5 - 8.1 g/dL   Albumin 2.6 (L) 3.5 - 5.0 g/dL   AST 19 15 - 41 U/L    Comment: HEMOLYSIS AT THIS LEVEL MAY AFFECT RESULT  ALT 9 0 - 44 U/L    Comment: HEMOLYSIS AT THIS LEVEL MAY AFFECT RESULT   Alkaline Phosphatase 54 38 - 126 U/L   Total Bilirubin 0.5 0.3 - 1.2 mg/dL    Comment: HEMOLYSIS AT THIS LEVEL MAY AFFECT RESULT   GFR, Estimated 50 (L) >60 mL/min    Comment: (NOTE) Calculated using the CKD-EPI Creatinine Equation (2021)    Anion gap 10 5 - 15    Comment: Performed at [facility] [address], [place], [place] [address]  Magnesium     Status: Abnormal   Collection Time: 11/12/22  6:53  AM  Result Value Ref Range   Magnesium 1.6 (L) 1.7 - 2.4 mg/dL    Comment: Performed at [facility] [address], [place], [place] [address]   US THORACENTESIS ASP PLEURAL SPACE W/IMG GUIDE  Result Date: 11/12/2022 INDICATION: History of breast cancer with recurrent right pleural effusion currently admitted for acute PE. Request received for therapeutic right thoracentesis. EXAM: ULTRASOUND GUIDED THERAPEUTIC RIGHT THORACENTESIS MEDICATIONS: 15 mL 1 % lidocaine COMPLICATIONS: None immediate. PROCEDURE: An ultrasound guided thoracentesis was thoroughly discussed with the patient and questions answered. The benefits, risks, alternatives and complications were also discussed. The patient understands and wishes to proceed with the procedure. Written consent was obtained. Ultrasound was performed to localize and mark an adequate pocket of fluid in the right chest. The area was then prepped and draped in the normal sterile fashion. 1% Lidocaine was used for local anesthesia. Under ultrasound guidance a 6 Fr Safe-T-Centesis catheter was introduced. Thoracentesis was performed. The catheter was removed and a dressing applied. FINDINGS: A total of approximately 800 cc of clear, yellow fluid was removed. IMPRESSION: Successful ultrasound guided right thoracentesis yielding 800 cc of pleural fluid. Read by: Narda Rutherford, AGNP-BC Electronically Signed   By: Miachel Roux M.D.   On: 11/12/2022 11:57   VAS [place] LOWER EXTREMITY VENOUS (DVT)  Result Date: 11/12/2022  Lower Venous DVT Study Patient Name:  Crystal [name]  Date of Exam:   11/12/2022 Medical Rec #: [id number]        Accession #:    [id number] Date of Birth: 1946-09-06         Patient Gender: F Patient Age:   25 years Exam Location:  [facility] Procedure:      VAS [place] LOWER EXTREMITY VENOUS (DVT) Referring Phys: Wandra Feinstein RATHORE --------------------------------------------------------------------------------  Indications: Pulmonary embolism.   Comparison Study: no prior Performing Technologist: Archie Patten RVS  Examination Guidelines: A complete evaluation includes B-mode imaging, spectral Doppler, color Doppler, and power Doppler as needed of all accessible portions of each vessel. Bilateral testing is considered an integral part of a complete examination. Limited examinations for reoccurring indications may be performed as noted. The reflux portion of the exam is performed with the patient in reverse Trendelenburg.  +--------+---------------+---------+-----------+----------+--------------------+ RIGHT   CompressibilityPhasicitySpontaneityPropertiesThrombus Aging       +--------+---------------+---------+-----------+----------+--------------------+ CFV     Full           Yes      Yes                                       +--------+---------------+---------+-----------+----------+--------------------+ SFJ     Full                                                              +--------+---------------+---------+-----------+----------+--------------------+  FV Prox Full                                                              +--------+---------------+---------+-----------+----------+--------------------+ FV Mid  Full                                                              +--------+---------------+---------+-----------+----------+--------------------+ FV      Full                                                              Distal                                                                    +--------+---------------+---------+-----------+----------+--------------------+ PFV     Full                                                              +--------+---------------+---------+-----------+----------+--------------------+ POP     Full           Yes      Yes                                       +--------+---------------+---------+-----------+----------+--------------------+  PTV     Full                                                              +--------+---------------+---------+-----------+----------+--------------------+ PERO                   Yes      Yes                  patent by color                                                           doppler              +--------+---------------+---------+-----------+----------+--------------------+   +--------+---------------+---------+-----------+----------+--------------------+ LEFT    CompressibilityPhasicitySpontaneityPropertiesThrombus Aging       +--------+---------------+---------+-----------+----------+--------------------+  CFV     Full           Yes      Yes                                       +--------+---------------+---------+-----------+----------+--------------------+ SFJ     Full                                                              +--------+---------------+---------+-----------+----------+--------------------+ FV Prox Full                                                              +--------+---------------+---------+-----------+----------+--------------------+ FV Mid  Full                                                              +--------+---------------+---------+-----------+----------+--------------------+ FV                     Yes      Yes                                       Distal                                                                    +--------+---------------+---------+-----------+----------+--------------------+ PFV     Full                                                              +--------+---------------+---------+-----------+----------+--------------------+ POP     Full           Yes      Yes                                       +--------+---------------+---------+-----------+----------+--------------------+ PTV     None                                         in a single paired  vein                 +--------+---------------+---------+-----------+----------+--------------------+ PERO                   Yes      Yes                  patent by color                                                           doppler              +--------+---------------+---------+-----------+----------+--------------------+     Summary: RIGHT: - There is no evidence of deep vein thrombosis in the lower extremity.  - No cystic structure found in the popliteal fossa.  LEFT: - Findings consistent with age indeterminate deep vein thrombosis involving the left posterior tibial veins. - No cystic structure found in the popliteal fossa.  *See table(s) above for measurements and observations.    Preliminary    DG Chest 1 View  Result Date: 11/12/2022 CLINICAL DATA:  Post thoracentesis right lung. EXAM: CHEST  1 VIEW COMPARISON:  11/11/2022 FINDINGS: Lungs are hypoinflated as patient is post right thoracentesis. Interval improvement in patient's right pleural effusion. There is persistent moderate opacification over the right mid to lower lung likely combination of atelectasis and residual fluid. No pneumothorax. Left lung is clear. Cardiomediastinal silhouette and remainder of the exam is unchanged. IMPRESSION: Interval improvement in patient's right pleural effusion post thoracentesis. Persistent moderate opacification over the right mid to lower lung likely combination of atelectasis and residual fluid. Electronically Signed   By: Marin Olp M.D.   On: 11/12/2022 09:41   DG Chest 2 View  Result Date: 11/11/2022 CLINICAL DATA:  Lung cancer, right pleural effusion EXAM: CHEST - 2 VIEW COMPARISON:  10/27/2022 FINDINGS: Moderate to large right pleural effusion has increased in size since prior examination with compressive atelectasis of the right lung base. Left lung is clear. No pneumothorax. No pleural effusion on the left. Cardiac size  within normal limits. Surgical clips are seen within the right axilla. No acute bone abnormality. IMPRESSION: 1. Moderate to large right pleural effusion, increased in size since prior examination. Electronically Signed   By: Fidela Salisbury M.D.   On: 11/11/2022 23:36   CT Angio Chest PE W and/or Wo Contrast  Result Date: 11/11/2022 CLINICAL DATA:  Lung cancer, pleural effusion. Pulmonary embolism (PE) suspected, low to intermediate prob, positive D-dimer EXAM: CT ANGIOGRAPHY CHEST WITH CONTRAST TECHNIQUE: Multidetector CT imaging of the chest was performed using the standard protocol during bolus administration of intravenous contrast. Multiplanar CT image reconstructions and MIPs were obtained to evaluate the vascular anatomy. RADIATION DOSE REDUCTION: This exam was performed according to the departmental dose-optimization program which includes automated exposure control, adjustment of the mA and/or kV according to patient size and/or use of iterative reconstruction technique. CONTRAST:  69m OMNIPAQUE IOHEXOL 350 MG/ML SOLN COMPARISON:  09/13/2022 FINDINGS: Cardiovascular: There are multiple small intraluminal filling defects identified within the segmental pulmonary arteries of the left upper lobe and left lower lobe in keeping with acute pulmonary emboli. The embolic burden is small. The central pulmonary arteries are of normal caliber. No CT evidence of right heart strain (RV/LV ratio 0.74). Mild coronary artery calcification. Global  cardiac size within normal limits. No pericardial effusion. Mild atherosclerotic calcification within the thoracic aorta. No aortic aneurysm. Mediastinum/Nodes: Visualized thyroid is unremarkable. No pathologic thoracic adenopathy. Right axillary lymph node dissection has been performed. Esophagus is unremarkable. Lungs/Pleura: Large right pleural effusion has enlarged since prior examination with near complete collapse of the right lower lobe. Right middle lobe pulmonary  mass is difficult to accurately measure given superimposed pulmonary collapse but measures at least 2.7 x 5.3 cm at axial image # 51/5. Progressive right middle lobe volume loss. Progressive compressive atelectasis of the right upper lobe. Left lung is clear. No pneumothorax. No pleural effusion on the left. There is obliteration of the right middle lobar segmental pulmonary bronchi by the malignant mass, progressive since prior examination Upper Abdomen: No acute abnormality. Musculoskeletal: Interval development of a acute to subacute fracture of the left ninth rib laterally. No focal lytic or blastic bone lesions. Review of the MIP images confirms the above findings. IMPRESSION: 1. Acute pulmonary emboli within the segmental pulmonary arteries of the left upper lobe and left lower lobe. The embolic burden is small. No CT evidence of right heart strain. 2. Interval enlargement of a large right pleural effusion with near complete collapse of the right lower lobe. 3. Right middle lobe pulmonary mass, difficult to accurately measure given superimposed pulmonary collapse but similar in size measuring 5.3 x 2.7 cm. Progressive obliteration of the right middle lobe segmental pulmonary bronchi by the malignant mass with increasing right middle lobe volume loss. 4. Mild coronary artery calcification. 5. Acute to subacute fracture of the left ninth rib laterally. Aortic Atherosclerosis (ICD10-I70.0). Electronically Signed   By: Fidela Salisbury M.D.   On: 11/11/2022 23:34   DG Chest 2 View  Result Date: 11/11/2022 CLINICAL DATA:  Shortness of breath. EXAM: CHEST - 2 VIEW COMPARISON:  Chest radiograph dated 11/10/2022. FINDINGS: Moderate right pleural effusion and right lung base atelectasis or infiltrate similar or slightly progressed since the prior radiograph. The left lung is clear. No pneumothorax. Stable cardiomediastinal silhouette. Atherosclerotic calcification of the aortic arch. No acute osseous pathology.  IMPRESSION: Moderate right pleural effusion and right lung base atelectasis or infiltrate. Electronically Signed   By: Anner Crete M.D.   On: 11/11/2022 22:08    Pending Labs Unresulted Labs (From admission, onward)     Start     Ordered   11/13/22 0500  CBC  Daily,   R     See Hyperspace for full Linked Orders Report.   11/11/22 2357   11/13/22 0500  Comprehensive metabolic panel  Tomorrow morning,   R        11/12/22 0902   11/13/22 0500  Magnesium  Tomorrow morning,   R        11/12/22 0902            Vitals/Pain Today's Vitals   11/12/22 1000 11/12/22 1145 11/12/22 1152 11/12/22 1200  BP: (!) 168/91 132/79    Pulse: (!) 115   90  Resp: (!) 26 (!) 21  (!) 21  Temp:   98 F (36.7 C)   TempSrc:   Oral   SpO2: 97%   95%  Weight:      Height:      PainSc:        Isolation Precautions No active isolations  Medications Medications  melatonin tablet 3 mg (has no administration in time range)  metoprolol tartrate (LOPRESSOR) tablet 50 mg (50 mg Oral Given 11/12/22 0955)  LORazepam (ATIVAN)  tablet 0.5 mg (has no administration in time range)  acetaminophen (TYLENOL) tablet 650 mg (has no administration in time range)  oxyCODONE (Oxy IR/ROXICODONE) immediate release tablet 5 mg (has no administration in time range)  traMADol (ULTRAM) tablet 50 mg (has no administration in time range)  potassium chloride SA (KLOR-CON M) CR tablet 40 mEq (40 mEq Oral Given 11/12/22 0955)  apixaban (ELIQUIS) tablet 10 mg (10 mg Oral Given 11/12/22 1220)    Followed by  apixaban (ELIQUIS) tablet 5 mg (has no administration in time range)  lidocaine (PF) (XYLOCAINE) 1 % injection 5 mL ( Intradermal Canceled Entry 11/12/22 0950)  perflutren lipid microspheres (DEFINITY) IV suspension (3 mLs Intravenous Given 11/12/22 1227)  potassium chloride SA (KLOR-CON M) CR tablet 40 mEq (40 mEq Oral Given 11/11/22 2325)  iohexol (OMNIPAQUE) 350 MG/ML injection 55 mL (55 mLs Intravenous Contrast Given  11/11/22 2319)  heparin bolus via infusion 4,000 Units (4,000 Units Intravenous Bolus from Bag 11/12/22 0023)  LORazepam (ATIVAN) tablet 0.5 mg (0.5 mg Oral Given 11/12/22 0151)  lidocaine (PF) (XYLOCAINE) 1 % injection ( Intradermal Given by Other 11/12/22 0943)  magnesium sulfate IVPB 2 g 50 mL (0 g Intravenous Stopped 11/12/22 1116)    Mobility walks     Focused Assessments Pulmonary Assessment Handoff:  Lung sounds: Bilateral Breath Sounds: Clear L Breath Sounds: Diminished, Clear R Breath Sounds: Diminished O2 Device: Nasal Cannula O2 Flow Rate (L/min): 2 L/min    R Recommendations: See Admitting Provider Note  Report given to: [id number]  Additional Notes: Also has SAH, CT shows [name] in November, pt has not c/o any HA or pain concerning that
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
## 2022-11-13 ENCOUNTER — Telehealth: Payer: Self-pay | Admitting: *Deleted

## 2022-11-13 ENCOUNTER — Inpatient Hospital Stay: Payer: No Typology Code available for payment source

## 2022-11-13 ENCOUNTER — Encounter: Payer: Self-pay | Admitting: Oncology

## 2022-11-13 ENCOUNTER — Other Ambulatory Visit (HOSPITAL_COMMUNITY): Payer: Self-pay

## 2022-11-13 ENCOUNTER — Observation Stay (HOSPITAL_COMMUNITY): Payer: No Typology Code available for payment source

## 2022-11-13 DIAGNOSIS — E669 Obesity, unspecified: Secondary | ICD-10-CM | POA: Diagnosis not present

## 2022-11-13 DIAGNOSIS — N390 Urinary tract infection, site not specified: Secondary | ICD-10-CM | POA: Diagnosis not present

## 2022-11-13 DIAGNOSIS — Z853 Personal history of malignant neoplasm of breast: Secondary | ICD-10-CM | POA: Diagnosis not present

## 2022-11-13 DIAGNOSIS — R079 Chest pain, unspecified: Secondary | ICD-10-CM | POA: Diagnosis not present

## 2022-11-13 DIAGNOSIS — R0602 Shortness of breath: Secondary | ICD-10-CM | POA: Diagnosis not present

## 2022-11-13 DIAGNOSIS — Z452 Encounter for adjustment and management of vascular access device: Secondary | ICD-10-CM | POA: Diagnosis not present

## 2022-11-13 DIAGNOSIS — N1831 Chronic kidney disease, stage 3a: Secondary | ICD-10-CM | POA: Diagnosis not present

## 2022-11-13 DIAGNOSIS — C3490 Malignant neoplasm of unspecified part of unspecified bronchus or lung: Secondary | ICD-10-CM | POA: Diagnosis not present

## 2022-11-13 DIAGNOSIS — Z6836 Body mass index (BMI) 36.0-36.9, adult: Secondary | ICD-10-CM | POA: Diagnosis not present

## 2022-11-13 DIAGNOSIS — C3491 Malignant neoplasm of unspecified part of right bronchus or lung: Secondary | ICD-10-CM | POA: Diagnosis not present

## 2022-11-13 DIAGNOSIS — Z86711 Personal history of pulmonary embolism: Secondary | ICD-10-CM | POA: Diagnosis not present

## 2022-11-13 DIAGNOSIS — J90 Pleural effusion, not elsewhere classified: Secondary | ICD-10-CM | POA: Diagnosis not present

## 2022-11-13 DIAGNOSIS — J939 Pneumothorax, unspecified: Secondary | ICD-10-CM | POA: Diagnosis not present

## 2022-11-13 DIAGNOSIS — I5022 Chronic systolic (congestive) heart failure: Secondary | ICD-10-CM | POA: Diagnosis not present

## 2022-11-13 DIAGNOSIS — J9601 Acute respiratory failure with hypoxia: Secondary | ICD-10-CM | POA: Diagnosis not present

## 2022-11-13 DIAGNOSIS — C342 Malignant neoplasm of middle lobe, bronchus or lung: Secondary | ICD-10-CM | POA: Diagnosis not present

## 2022-11-13 DIAGNOSIS — I130 Hypertensive heart and chronic kidney disease with heart failure and stage 1 through stage 4 chronic kidney disease, or unspecified chronic kidney disease: Secondary | ICD-10-CM | POA: Diagnosis not present

## 2022-11-13 DIAGNOSIS — Z923 Personal history of irradiation: Secondary | ICD-10-CM | POA: Diagnosis not present

## 2022-11-13 DIAGNOSIS — J9811 Atelectasis: Secondary | ICD-10-CM | POA: Diagnosis not present

## 2022-11-13 DIAGNOSIS — F419 Anxiety disorder, unspecified: Secondary | ICD-10-CM | POA: Diagnosis not present

## 2022-11-13 DIAGNOSIS — Z66 Do not resuscitate: Secondary | ICD-10-CM | POA: Diagnosis not present

## 2022-11-13 DIAGNOSIS — J910 Malignant pleural effusion: Secondary | ICD-10-CM | POA: Diagnosis not present

## 2022-11-13 DIAGNOSIS — E876 Hypokalemia: Secondary | ICD-10-CM | POA: Diagnosis not present

## 2022-11-13 DIAGNOSIS — Z888 Allergy status to other drugs, medicaments and biological substances status: Secondary | ICD-10-CM | POA: Diagnosis not present

## 2022-11-13 DIAGNOSIS — J9819 Other pulmonary collapse: Secondary | ICD-10-CM | POA: Diagnosis not present

## 2022-11-13 DIAGNOSIS — I82442 Acute embolism and thrombosis of left tibial vein: Secondary | ICD-10-CM | POA: Diagnosis not present

## 2022-11-13 DIAGNOSIS — R319 Hematuria, unspecified: Secondary | ICD-10-CM | POA: Diagnosis not present

## 2022-11-13 DIAGNOSIS — I2699 Other pulmonary embolism without acute cor pulmonale: Secondary | ICD-10-CM | POA: Diagnosis not present

## 2022-11-13 DIAGNOSIS — Z8572 Personal history of non-Hodgkin lymphomas: Secondary | ICD-10-CM | POA: Diagnosis not present

## 2022-11-13 DIAGNOSIS — E7800 Pure hypercholesterolemia, unspecified: Secondary | ICD-10-CM | POA: Diagnosis not present

## 2022-11-13 DIAGNOSIS — I2694 Multiple subsegmental pulmonary emboli without acute cor pulmonale: Secondary | ICD-10-CM | POA: Diagnosis not present

## 2022-11-13 LAB — COMPREHENSIVE METABOLIC PANEL
ALT: 12 U/L (ref 0–44)
AST: 17 U/L (ref 15–41)
Albumin: 2.4 g/dL — ABNORMAL LOW (ref 3.5–5.0)
Alkaline Phosphatase: 55 U/L (ref 38–126)
Anion gap: 7 (ref 5–15)
BUN: 9 mg/dL (ref 8–23)
CO2: 26 mmol/L (ref 22–32)
Calcium: 8.1 mg/dL — ABNORMAL LOW (ref 8.9–10.3)
Chloride: 108 mmol/L (ref 98–111)
Creatinine, Ser: 1.08 mg/dL — ABNORMAL HIGH (ref 0.44–1.00)
GFR, Estimated: 53 mL/min — ABNORMAL LOW (ref >60)
Glucose, Bld: 107 mg/dL — ABNORMAL HIGH (ref 70–99)
Potassium: 3.9 mmol/L (ref 3.5–5.1)
Sodium: 141 mmol/L (ref 135–145)
Total Bilirubin: 0.1 mg/dL — ABNORMAL LOW (ref 0.3–1.2)
Total Protein: 5.6 g/dL — ABNORMAL LOW (ref 6.5–8.1)

## 2022-11-13 LAB — CBC
HCT: 38.6 % (ref 36.0–46.0)
Hemoglobin: 12.5 g/dL (ref 12.0–15.0)
MCH: 29.7 pg (ref 26.0–34.0)
MCHC: 32.4 g/dL (ref 30.0–36.0)
MCV: 91.7 fL (ref 80.0–100.0)
Platelets: 228 K/uL (ref 150–400)
RBC: 4.21 MIL/uL (ref 3.87–5.11)
RDW: 15.2 % (ref 11.5–15.5)
WBC: 5.2 K/uL (ref 4.0–10.5)
nRBC: 0 % (ref 0.0–0.2)

## 2022-11-13 LAB — MAGNESIUM: Magnesium: 1.9 mg/dL (ref 1.7–2.4)

## 2022-11-13 NOTE — Progress Notes (Signed)
   11/13/22 1320  Spiritual Encounters  Type of Visit Initial  Care provided to: Pt and family  Conversation partners present during encounter Nurse  Referral source Nurse (RN/NT/LPN);Family  Reason for visit Advance directives   Chaplain Tery Sanfilippo responded to the consult request for an Advance Directive. The patient's sister and niece, Aundra Millet who is an Charity fundraiser in ED was at the bedside. This chaplain provided A.D. education. The patient wants to appoint Aundra Millet as her HCPOA. Advised [place] notary is not available until tomorrow morning.  Advised this chaplain will refer request for notary for A.M. chaplain to follow up. This note was prepared by Deneen Harts, M.Div..  For questions please contact by phone [phone].

## 2022-11-13 NOTE — Progress Notes (Signed)
[facility] CSW Progress Note  Clinical Child psychotherapist contacted patient by phone to discuss advance directives per her request.  Plan is for CSW to contact her in a few days to schedule once she is home from the hospital..    Pascal Lux Aishia Barkey, LCSW

## 2022-11-13 NOTE — TOC Benefit Eligibility Note (Signed)
Patient Product/process development scientist completed.    The patient is currently admitted and upon discharge could be taking Eliquis 5 mg.  The current 30 day co-pay is $45.00.   The patient is insured through [employer] Medicare Part D   Roland Earl, CPHT Pharmacy Patient Advocate Specialist [facility] Patient Advocate Team Direct Number: [phone]  Fax: [phone]
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
## 2022-11-13 NOTE — Telephone Encounter (Signed)
Oncology Discharge Planning Admission Note  [facility] Address: [address], [place], [place] [address] Hours of Operation:  8am - 5pm, Monday - Friday  Clinic Contact Information:  [phone]  Oncology Care Team: Medical Oncologist:  Dr. Thornton Papas  Contacted Crystal [name] to inform that the oncology provider Dr. Truett Perna  is aware of this hospital admission dated 11/11/22, and the cancer center will follow Crystal [name]'s inpatient care to assist with discharge planning as indicated by the oncologist. Dr. Truett Perna will come see her tomorrow at 0600.  Disclaimer:  This Cancer Center nursing note does not imply a formal consult request has been made by the admitting attending for this admission or there will be an inpatient consult completed by oncology.  Please request oncology consults as per standard process as indicated.

## 2022-11-14 ENCOUNTER — Inpatient Hospital Stay (HOSPITAL_COMMUNITY): Payer: No Typology Code available for payment source

## 2022-11-14 DIAGNOSIS — C342 Malignant neoplasm of middle lobe, bronchus or lung: Secondary | ICD-10-CM | POA: Diagnosis not present

## 2022-11-14 DIAGNOSIS — I2699 Other pulmonary embolism without acute cor pulmonale: Secondary | ICD-10-CM | POA: Diagnosis not present

## 2022-11-14 DIAGNOSIS — C3490 Malignant neoplasm of unspecified part of unspecified bronchus or lung: Secondary | ICD-10-CM

## 2022-11-14 DIAGNOSIS — J9811 Atelectasis: Secondary | ICD-10-CM | POA: Diagnosis not present

## 2022-11-14 DIAGNOSIS — R0602 Shortness of breath: Secondary | ICD-10-CM | POA: Diagnosis not present

## 2022-11-14 DIAGNOSIS — J910 Malignant pleural effusion: Secondary | ICD-10-CM | POA: Diagnosis not present

## 2022-11-14 DIAGNOSIS — R079 Chest pain, unspecified: Secondary | ICD-10-CM | POA: Diagnosis not present

## 2022-11-14 DIAGNOSIS — J90 Pleural effusion, not elsewhere classified: Secondary | ICD-10-CM | POA: Diagnosis not present

## 2022-11-14 LAB — CBC
HCT: 41.2 % (ref 36.0–46.0)
Hemoglobin: 13.1 g/dL (ref 12.0–15.0)
MCH: 29.8 pg (ref 26.0–34.0)
MCHC: 31.8 g/dL (ref 30.0–36.0)
MCV: 93.8 fL (ref 80.0–100.0)
Platelets: 247 10*3/uL (ref 150–400)
RBC: 4.39 MIL/uL (ref 3.87–5.11)
RDW: 15.3 % (ref 11.5–15.5)
WBC: 4.6 10*3/uL (ref 4.0–10.5)
nRBC: 0 % (ref 0.0–0.2)

## 2022-11-14 LAB — COMPREHENSIVE METABOLIC PANEL
ALT: 12 U/L (ref 0–44)
AST: 20 U/L (ref 15–41)
Albumin: 2.7 g/dL — ABNORMAL LOW (ref 3.5–5.0)
Alkaline Phosphatase: 65 U/L (ref 38–126)
Anion gap: 9 (ref 5–15)
BUN: 9 mg/dL (ref 8–23)
CO2: 24 mmol/L (ref 22–32)
Calcium: 8.4 mg/dL — ABNORMAL LOW (ref 8.9–10.3)
Chloride: 106 mmol/L (ref 98–111)
Creatinine, Ser: 1.26 mg/dL — ABNORMAL HIGH (ref 0.44–1.00)
GFR, Estimated: 44 mL/min — ABNORMAL LOW (ref >60)
Glucose, Bld: 115 mg/dL — ABNORMAL HIGH (ref 70–99)
Potassium: 3.7 mmol/L (ref 3.5–5.1)
Sodium: 139 mmol/L (ref 135–145)
Total Bilirubin: 0.4 mg/dL (ref 0.3–1.2)
Total Protein: 6.3 g/dL — ABNORMAL LOW (ref 6.5–8.1)

## 2022-11-14 MED ORDER — HEPARIN (PORCINE) 25000 UT/250ML-% IV SOLN
1400.0000 [IU]/h | INTRAVENOUS | Status: DC
  Administered 2022-11-14: 1200 [IU]/h via INTRAVENOUS
  Filled 2022-11-14: qty 250

## 2022-11-14 MED ORDER — ADULT MULTIVITAMIN W/MINERALS CH
1.0000 | ORAL_TABLET | Freq: Every day | ORAL | Status: DC
  Administered 2022-11-14 – 2022-11-18 (×5): 1 via ORAL
  Filled 2022-11-14 (×5): qty 1

## 2022-11-14 MED ORDER — METOPROLOL TARTRATE 50 MG PO TABS
100.0000 mg | ORAL_TABLET | Freq: Two times a day (BID) | ORAL | Status: DC
  Administered 2022-11-14 – 2022-11-18 (×8): 100 mg via ORAL
  Filled 2022-11-14 (×8): qty 2

## 2022-11-14 NOTE — Progress Notes (Signed)
ANTICOAGULATION CONSULT NOTE   Pharmacy Consult for Apixaban to Heparin  Indication: pulmonary embolus  Allergies  Allergen Reactions   Simvastatin     Leg cramps   Zoloft [Sertraline] Nausea Only   Codeine Other (See Comments)    Bad Headaches   Coreg Other (See Comments)    "Made my legs hurt"   Lisinopril Cough   Tizanidine Hcl Rash    hypotension    Patient Measurements: Height: 5\' 4"  (162.6 cm) Weight: 97.1 kg (214 lb) IBW/kg (Calculated) : 54.7 Heparin Dosing Weight: 80kg  Vital Signs: Temp: 98.1 F (36.7 C) (01/23 0747) Temp Source: Oral (01/23 0747) BP: 126/77 (01/23 0747) Pulse Rate: 110 (01/23 0747)  Labs: Recent Labs    11/11/22 2103 11/12/22 0003 11/12/22 0653 11/13/22 0449 11/14/22 0816  HGB 13.6  --   --  12.5 13.1  HCT 40.4  --   --  38.6 41.2  PLT 244  --   --  228 247  HEPARINUNFRC  --   --  0.91*  --   --   CREATININE 1.18*  --  1.13* 1.08* 1.26*  TROPONINIHS  --  13 12  --   --      Estimated Creatinine Clearance: 42.3 mL/min (A) (by C-G formula based on SCr of 1.26 mg/dL (H)).   Medical History: Past Medical History:  Diagnosis Date   Anxiety    Atherosclerosis of aorta (HCC)    Breast cancer (HCC) 1998   (Rt) lumpectomy dx 1999; Dr. 2000   Dyslipidemia, goal LDL below 130    Essential hypertension    GERD (gastroesophageal reflux disease)    Hypercholesterolemia    IBS (irritable bowel syndrome)    Lymphoma (HCC)    lymphoma dx 11/28/10 - left neck   non hodgkins lymphoma 12/2010   right aprotid gland   Nonischemic cardiomyopathy (HCC) 2008   ? Doxorubincin induced; essentially resolved as of echo in January 2014, current EF 50-55%. Grade 1 diastolic dysfunction.   Obesity    Severe obesity (BMI >= 40) (HCC) 05/21/2013   Improve to BMI of 39 by July 2015   Tremor     Assessment: 77yo female undergoing radiation for lung ca c/o CP and SOB x2d, CT reveals PE w/ small clot burden. Pharmacy consulted to dose and manage  heparin infusion for acute PE on 1/20. Pharmacy consulted to transition pt to apixaban on 11/12/22.  Now converting back to heparin for possible pleur x cath 1/23  Last dose of apixaban at 10 am 1/23  Goal of Therapy:  Heparin level 0.3-0.7 units/ml Monitor platelets by anticoagulation protocol: Yes PTT 66 to 102 seconds   Plan:  Heparin at 1200 units / hr starting at 10 pm Daily heparin level, PTT, CBC  Thank you 2/23, PharmD 11/14/2022 12:53 PM   Please refer to [facility] for pharmacy phone number

## 2022-11-14 NOTE — Progress Notes (Signed)
CSW spoke with Amy at [facility] who states the agency can accept the patient for [facility] services. Iantha Fallen will contact patient directly after discharge to initiate services.  Edwin Dada, MSW, LCSW Transitions of Care  Clinical Social Worker II [phone]
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
## 2022-11-14 NOTE — Consult Note (Signed)
Chief Complaint: Patient was seen in consultation today for [facility] a cath placement; [facility] lung Ca Chief Complaint  Patient presents with   Chest Pain   Shortness of Breath   at the request of Dr Darrold Junker  Supervising Physician: Daryll Brod  Patient Status: [facility] - In-pt  History of Present Illness: PESSY DELAMAR is a 77 y.o. female   New dx non small cell lung cancer Hx Lymphoma Adm 11/11/22 with SOB Has had 3 thoracentesis 09/21/22; 10/27/22; 11/12/22--- +malignant effusion Adenocarcinoma  Planned for PleurX catheter placement with PCCM 11/16/22  Dr Benay Spice starting chemo asap after [place] placed as IP    Past Medical History:  Diagnosis Date   Anxiety    Atherosclerosis of aorta ([place])    Breast cancer ([place]) 1998   (Rt) lumpectomy dx 1999; Dr. Benay Spice   Dyslipidemia, goal LDL below 130    Essential hypertension    GERD (gastroesophageal reflux disease)    Hypercholesterolemia    IBS (irritable bowel syndrome)    Lymphoma ([place])    lymphoma dx 11/28/10 - left neck   non hodgkins lymphoma 12/2010   right aprotid gland   Nonischemic cardiomyopathy ([place]) 2008   ? Doxorubincin induced; essentially resolved as of echo in January 2014, current EF 50-55%. Grade 1 diastolic dysfunction.   Obesity    Severe obesity (BMI >= 40) ([place]) 05/21/2013   Improve to BMI of 39 by July 2015   Tremor     Past Surgical History:  Procedure Laterality Date   ABDOMINAL HYSTERECTOMY     BSO   ANKLE FRACTURE SURGERY Left    BREAST EXCISIONAL BIOPSY Left    BREAST EXCISIONAL BIOPSY Right    BREAST LUMPECTOMY Right 1998   BREAST LUMPECTOMY Right 08/25/2020   Procedure: RIGHT BREAST LUMPECTOMY;  Surgeon: Coralie Keens, MD;  Location: [place];  Service: General;  Laterality: Right;   CHOLECYSTECTOMY N/A 06/09/2020   Procedure: LAPAROSCOPIC CHOLECYSTECTOMY;  Surgeon: Coralie Keens, MD;  Location: [facility];  Service: General;  Laterality: N/A;    COLONOSCOPY     IR BONE TUMOR(S)RF ABLATION  11/06/2022   IR KYPHO LUMBAR INC FX REDUCE BONE BX UNI/BIL CANNULATION INC/IMAGING  11/06/2022   IR RADIOLOGIST EVAL & MGMT  10/26/2022   LEFT HEART CATH AND CORONARY ANGIOGRAPHY  12/2007   None coronary disease, EF 45% (up from nuclear study EF of 30% in June '08)   NM MYOVIEW LTD  03/29/2007   EF 33%,NEGATIVE ISCHEMIA, prob need cath   NM MYOVIEW LTD  05/01/2014   Lexiscan: EF 55%. Normal wall motion; no ischemia or infarction; apical thinning   PORTACATH PLACEMENT  08/25/2013   rt. with tip in cavoatrial junction, Dr.Yamagata    TRANSTHORACIC ECHOCARDIOGRAM  05/01/2014   Normal LV size with low normal function. EF of 50-55% and Gr 1 DD; aortic sclerosis without stenosis. MAC and thickening/calcification of the anterior leaflet - no notable AI / AS or MR/MS    Allergies: Simvastatin, Zoloft [sertraline], Codeine, Coreg, Lisinopril, and Tizanidine hcl  Medications: Prior to Admission medications   Medication Sig Start Date End Date Taking? Authorizing Provider  acetaminophen (TYLENOL) 325 MG tablet Take 2 tablets (650 mg total) by mouth every 6 (six) hours as needed for mild pain or moderate pain. 09/14/22  Yes Arrien, Jimmy Picket, MD  Ibuprofen-diphenhydrAMINE HCl (ADVIL PM) 200-25 MG CAPS Take 3 tablets by mouth at bedtime as needed (for sleep).   Yes [provider]  LORazepam (ATIVAN) 0.5 MG tablet Take 1 tablet (0.5 mg total) by mouth 2 (two) times daily as needed. Patient taking differently: Take 0.5 mg by mouth 2 (two) times daily as needed for anxiety. 10/31/22  Yes Ladell Pier, MD  metoprolol tartrate (LOPRESSOR) 50 MG tablet Take 50 mg by mouth 2 (two) times daily.   Yes [provider]  OVER THE COUNTER MEDICATION Take 1 capsule by mouth daily. Gallbladder Enzymes OTC   Yes [provider]  oxyCODONE (OXY IR/ROXICODONE) 5 MG immediate release tablet Take 1 tablet (5 mg total) by mouth every 6 (six)  hours as needed for severe pain. 10/24/22  Yes Ladell Pier, MD     Family History  Problem Relation Age of Onset   Heart Problems Mother        CABG   Chronic Renal Failure Mother    Heart Problems Father    Hypertension Sister    Cancer Sister        bladder   Migraines Sister    Heart attack Sister    Hypertension Sister        x2   Lupus Brother    Hypertension Brother        and lupus   Heart Problems Maternal Grandmother    Stroke Maternal Grandfather    Cancer Paternal Grandmother        stomach   Heart Problems Paternal Grandfather     Social History   Socioeconomic History   Marital status: Widowed    Spouse name: Not on file   Number of children: 3   Years of education: hs   Highest education level: Not on file  Occupational History   Occupation: Retired  Tobacco Use   Smoking status: Never   Smokeless tobacco: Never  Vaping Use   Vaping Use: Never used  Substance and Sexual Activity   Alcohol use: Yes    Comment: social    Drug use: No   Sexual activity: Not on file  Other Topics Concern   Not on file  Social History Narrative   She is a widow mother of 2, with 1 child is deceased. She has 2 grandchildren. She is trying to get some walking in town and can do about 20-25 minutes his another 30 minutes she pushes a more night gets short of breath.   She is at retired [employer], he simply laid off after 45 years.   She usually comes accompanied by her sister, who is also my patient.   She never was a smoker, and only occasionally has a social alcoholic beverage.   Social Determinants of Health   Financial Resource Strain: Not on file  Food Insecurity: No Food Insecurity (11/12/2022)   Hunger Vital Sign    Worried About Running Out of Food in the Last Year: Never true    Ran Out of Food in the Last Year: Never true  Transportation Needs: No Transportation Needs (11/12/2022)   PRAPARE - Hydrologist (Medical):  No    Lack of Transportation (Non-Medical): No  Physical Activity: Not on file  Stress: Not on file  Social Connections: Not on file    Review of Systems: A 12 point ROS discussed and pertinent positives are indicated in the HPI above.  All other systems are negative.  Review of Systems  Constitutional:  Positive for fatigue. Negative for fever.  Respiratory:  Positive for shortness of breath.   Psychiatric/Behavioral:  Negative for behavioral problems and confusion.     Vital Signs: BP 126/77 (BP Location: Left Arm)   Pulse (!) 110   Temp 98.1 F (36.7 C) (Oral)   Resp 16   Ht _0  (1.626 m)   Wt 214 lb (97.1 kg)   SpO2 94%   BMI 36.73 kg/m    Physical Exam Vitals reviewed.  Cardiovascular:     Rate and Rhythm: Normal rate and regular rhythm.  Pulmonary:     Effort: Pulmonary effort is normal.     Breath sounds: No wheezing.  Abdominal:     Palpations: Abdomen is soft.  Musculoskeletal:        General: Normal range of motion.  Skin:    General: Skin is warm.  Neurological:     Mental Status: She is alert and oriented to person, place, and time.  Psychiatric:        Behavior: Behavior normal.     Imaging: DG Chest Port 1 View  Result Date: 11/14/2022 CLINICAL DATA:  Chest pain with shortness of breath. Malignant pleural effusion post recent right thoracentesis. EXAM: PORTABLE CHEST 1 VIEW COMPARISON:  Radiographs 11/13/2022 and 11/12/2022.  CT 11/11/2022. FINDINGS: 0522 hours. The heart size and mediastinal contours are stable. Residual right pleural effusion, right basilar airspace disease and known right middle lobe mass are grossly unchanged. There is mild left basilar atelectasis. No pneumothorax. The bones are unchanged. There are surgical clips in the right breast and right axilla. IMPRESSION: No evidence of pneumothorax following recent right thoracentesis. Residual right pleural effusion and right basilar airspace disease are grossly unchanged.  Electronically Signed   By: Richardean Sale M.D.   On: 11/14/2022 08:21   DG Chest Port 1 View  Result Date: 11/13/2022 CLINICAL DATA:  Follow-up pleural effusion EXAM: PORTABLE CHEST 1 VIEW COMPARISON:  11/12/2022 FINDINGS: Alveolar consolidation right base with interval improvement. Moderate right-sided pleural effusion. Left lung clear. No pneumothorax. Aorta is calcified. IMPRESSION: Right basilar consolidation consistent with pneumonia with some improvement. Electronically Signed   By: Sammie Bench M.D.   On: 11/13/2022 12:08   ECHOCARDIOGRAM COMPLETE  Result Date: 11/12/2022    ECHOCARDIOGRAM REPORT   Patient Name:   NELVA HAUK Date of Exam: 11/12/2022 Medical Rec #:  [id number]       Height:       64.0 in Accession #:    [id number]      Weight:       214.0 lb Date of Birth:  [birth year]        BSA:          2.013 m Patient Age:    21 years        BP:           168/91 mmHg Patient Gender: F               HR:           90 bpm. Exam Location:  Inpatient Procedure: 2D Echo and Intracardiac Opacification Agent Indications:    pulmonary embolus  History:        Patient has prior history of Echocardiogram examinations, most                 recent 02/03/2020. CAD, Arrythmias:Atrial Fibrillation; Risk                 Factors:Hypertension and Dyslipidemia.  Sonographer:    Harvie Junior Referring Phys: [place]  Sonographer Comments: Technically  difficult study due to poor echo windows and patient is obese. Image acquisition challenging due to patient body habitus. IMPRESSIONS  1. Left ventricular ejection fraction, by estimation, is 45 to 50%. The left ventricle has mildly decreased function. The left ventricle demonstrates global hypokinesis. There is mild left ventricular hypertrophy. Left ventricular diastolic parameters are consistent with Grade I diastolic dysfunction (impaired relaxation).  2. Right ventricular systolic function is normal. The right ventricular size is normal. There is  normal pulmonary artery systolic pressure. The estimated right ventricular systolic pressure is 14.7 mmHg.  3. The mitral valve is normal in structure. Trivial mitral valve regurgitation. No evidence of mitral stenosis.  4. The aortic valve was not well visualized. Aortic valve regurgitation is not visualized. No aortic stenosis is present.  5. The inferior vena cava is normal in size with greater than 50% respiratory variability, suggesting right atrial pressure of 3 mmHg. FINDINGS  Left Ventricle: Left ventricular ejection fraction, by estimation, is 45 to 50%. The left ventricle has mildly decreased function. The left ventricle demonstrates global hypokinesis. The left ventricular internal cavity size was normal in size. There is  mild left ventricular hypertrophy. Left ventricular diastolic parameters are consistent with Grade I diastolic dysfunction (impaired relaxation). Right Ventricle: The right ventricular size is normal. No increase in right ventricular wall thickness. Right ventricular systolic function is normal. There is normal pulmonary artery systolic pressure. The tricuspid regurgitant velocity is 2.22 m/s, and  with an assumed right atrial pressure of 3 mmHg, the estimated right ventricular systolic pressure is 82.9 mmHg. Left Atrium: Left atrial size was normal in size. Right Atrium: Right atrial size was normal in size. Pericardium: There is no evidence of pericardial effusion. Presence of epicardial fat layer. Mitral Valve: The mitral valve is normal in structure. Trivial mitral valve regurgitation. No evidence of mitral valve stenosis. Tricuspid Valve: The tricuspid valve is normal in structure. Tricuspid valve regurgitation is trivial. Aortic Valve: The aortic valve was not well visualized. Aortic valve regurgitation is not visualized. No aortic stenosis is present. Aortic valve mean gradient measures 4.0 mmHg. Aortic valve peak gradient measures 7.7 mmHg. Aortic valve area, by VTI measures  2.05 cm. Pulmonic Valve: The pulmonic valve was not well visualized. Pulmonic valve regurgitation is trivial. Aorta: The aortic root and ascending aorta are structurally normal, with no evidence of dilitation. Venous: The inferior vena cava is normal in size with greater than 50% respiratory variability, suggesting right atrial pressure of 3 mmHg. IAS/Shunts: No atrial level shunt detected by color flow Doppler.  LEFT VENTRICLE PLAX 2D LVIDd:         4.60 cm     Diastology LVIDs:         3.90 cm     LV e' medial:    3.92 cm/s LV PW:         0.80 cm     LV E/e' medial:  16.7 LV IVS:        0.80 cm     LV e' lateral:   7.83 cm/s LVOT diam:     2.30 cm     LV E/e' lateral: 8.4 LV SV:         53 LV SV Index:   26 LVOT Area:     4.15 cm  LV Volumes (MOD) LV vol d, MOD A2C: 57.6 ml LV vol d, MOD A4C: 75.0 ml LV vol s, MOD A2C: 29.0 ml LV vol s, MOD A4C: 38.5 ml LV SV MOD A2C:  28.6 ml LV SV MOD A4C:     75.0 ml LV SV MOD BP:      35.1 ml RIGHT VENTRICLE RV Basal diam:  2.60 cm RV Mid diam:    2.30 cm RV S prime:     11.20 cm/s TAPSE (M-mode): 1.5 cm LEFT ATRIUM             Index        RIGHT ATRIUM          Index LA diam:        3.20 cm 1.59 cm/m   RA Area:     7.32 cm LA Vol (A2C):   37.9 ml 18.82 ml/m  RA Volume:   11.90 ml 5.91 ml/m LA Vol (A4C):   29.8 ml 14.80 ml/m LA Biplane Vol: 35.5 ml 17.63 ml/m  AORTIC VALVE                    PULMONIC VALVE AV Area (Vmax):    2.08 cm     PV Vmax:          0.96 m/s AV Area (Vmean):   2.01 cm     PV Peak grad:     3.7 mmHg AV Area (VTI):     2.05 cm     PR End Diast Vel: 3.61 msec AV Vmax:           139.00 cm/s AV Vmean:          96.200 cm/s AV VTI:            0.259 m AV Peak Grad:      7.7 mmHg AV Mean Grad:      4.0 mmHg LVOT Vmax:         69.70 cm/s LVOT Vmean:        46.600 cm/s LVOT VTI:          0.128 m LVOT/AV VTI ratio: 0.49  AORTA Ao Root diam: 3.70 cm Ao Asc diam:  3.30 cm MITRAL VALVE                TRICUSPID VALVE MV Area (PHT): 5.54 cm     TR Peak  grad:   19.7 mmHg MV Decel Time: 137 msec     TR Vmax:        222.00 cm/s MV E velocity: 65.50 cm/s MV A velocity: 109.00 cm/s  SHUNTS MV E/A ratio:  0.60         Systemic VTI:  0.13 m                             Systemic Diam: 2.30 cm Oswaldo Milian MD Electronically signed by Oswaldo Milian MD Signature Date/Time: 11/12/2022/2:27:15 PM    Final    VAS [place] LOWER EXTREMITY VENOUS (DVT)  Result Date: 11/12/2022  Lower Venous DVT Study Patient Name:  Winfred Burn  Date of Exam:   11/12/2022 Medical Rec #: [id number]        Accession #:    [id number] Date of Birth: 1946/09/14         Patient Gender: F Patient Age:   89 years Exam Location:  [facility] Procedure:      VAS [place] LOWER EXTREMITY VENOUS (DVT) Referring Phys: Wandra Feinstein RATHORE --------------------------------------------------------------------------------  Indications: Pulmonary embolism.  Comparison Study: no prior Performing Technologist: Archie Patten RVS  Examination Guidelines: A complete evaluation includes B-mode imaging, spectral Doppler, color Doppler,  and power Doppler as needed of all accessible portions of each vessel. Bilateral testing is considered an integral part of a complete examination. Limited examinations for reoccurring indications may be performed as noted. The reflux portion of the exam is performed with the patient in reverse Trendelenburg.  +--------+---------------+---------+-----------+----------+--------------------+ RIGHT   CompressibilityPhasicitySpontaneityPropertiesThrombus Aging       +--------+---------------+---------+-----------+----------+--------------------+ CFV     Full           Yes      Yes                                       +--------+---------------+---------+-----------+----------+--------------------+ SFJ     Full                                                              +--------+---------------+---------+-----------+----------+--------------------+ FV Prox  Full                                                              +--------+---------------+---------+-----------+----------+--------------------+ FV Mid  Full                                                              +--------+---------------+---------+-----------+----------+--------------------+ FV      Full                                                              Distal                                                                    +--------+---------------+---------+-----------+----------+--------------------+ PFV     Full                                                              +--------+---------------+---------+-----------+----------+--------------------+ POP     Full           Yes      Yes                                       +--------+---------------+---------+-----------+----------+--------------------+ PTV     Full                                                              +--------+---------------+---------+-----------+----------+--------------------+  PERO                   Yes      Yes                  patent by color                                                           doppler              +--------+---------------+---------+-----------+----------+--------------------+   +--------+---------------+---------+-----------+----------+--------------------+ LEFT    CompressibilityPhasicitySpontaneityPropertiesThrombus Aging       +--------+---------------+---------+-----------+----------+--------------------+ CFV     Full           Yes      Yes                                       +--------+---------------+---------+-----------+----------+--------------------+ SFJ     Full                                                              +--------+---------------+---------+-----------+----------+--------------------+ FV Prox Full                                                               +--------+---------------+---------+-----------+----------+--------------------+ FV Mid  Full                                                              +--------+---------------+---------+-----------+----------+--------------------+ FV                     Yes      Yes                                       Distal                                                                    +--------+---------------+---------+-----------+----------+--------------------+ PFV     Full                                                              +--------+---------------+---------+-----------+----------+--------------------+  POP     Full           Yes      Yes                                       +--------+---------------+---------+-----------+----------+--------------------+ PTV     None                                         in a single paired                                                        vein                 +--------+---------------+---------+-----------+----------+--------------------+ PERO                   Yes      Yes                  patent by color                                                           doppler              +--------+---------------+---------+-----------+----------+--------------------+     Summary: RIGHT: - There is no evidence of deep vein thrombosis in the lower extremity.  - No cystic structure found in the popliteal fossa.  LEFT: - Findings consistent with age indeterminate deep vein thrombosis involving the left posterior tibial veins. - No cystic structure found in the popliteal fossa.  *See table(s) above for measurements and observations. Electronically signed by Harold Barban MD on 11/12/2022 at 2:15:42 PM.    Final    US THORACENTESIS ASP PLEURAL SPACE W/IMG GUIDE  Result Date: 11/12/2022 INDICATION: History of breast cancer with recurrent right pleural effusion currently admitted for acute PE. Request received  for therapeutic right thoracentesis. EXAM: ULTRASOUND GUIDED THERAPEUTIC RIGHT THORACENTESIS MEDICATIONS: 15 mL 1 % lidocaine COMPLICATIONS: None immediate. PROCEDURE: An ultrasound guided thoracentesis was thoroughly discussed with the patient and questions answered. The benefits, risks, alternatives and complications were also discussed. The patient understands and wishes to proceed with the procedure. Written consent was obtained. Ultrasound was performed to localize and mark an adequate pocket of fluid in the right chest. The area was then prepped and draped in the normal sterile fashion. 1% Lidocaine was used for local anesthesia. Under ultrasound guidance a 6 Fr Safe-T-Centesis catheter was introduced. Thoracentesis was performed. The catheter was removed and a dressing applied. FINDINGS: A total of approximately 800 cc of clear, yellow fluid was removed. IMPRESSION: Successful ultrasound guided right thoracentesis yielding 800 cc of pleural fluid. Read by: Narda Rutherford, AGNP-BC Electronically Signed   By: Miachel Roux M.D.   On: 11/12/2022 11:57   DG Chest 1 View  Result Date: 11/12/2022 CLINICAL DATA:  Post thoracentesis right lung. EXAM: CHEST  1 VIEW COMPARISON:  11/11/2022 FINDINGS: Lungs are hypoinflated as  patient is post right thoracentesis. Interval improvement in patient's right pleural effusion. There is persistent moderate opacification over the right mid to lower lung likely combination of atelectasis and residual fluid. No pneumothorax. Left lung is clear. Cardiomediastinal silhouette and remainder of the exam is unchanged. IMPRESSION: Interval improvement in patient's right pleural effusion post thoracentesis. Persistent moderate opacification over the right mid to lower lung likely combination of atelectasis and residual fluid. Electronically Signed   By: Marin Olp M.D.   On: 11/12/2022 09:41   DG Chest 2 View  Result Date: 11/11/2022 CLINICAL DATA:  Lung cancer, right pleural  effusion EXAM: CHEST - 2 VIEW COMPARISON:  10/27/2022 FINDINGS: Moderate to large right pleural effusion has increased in size since prior examination with compressive atelectasis of the right lung base. Left lung is clear. No pneumothorax. No pleural effusion on the left. Cardiac size within normal limits. Surgical clips are seen within the right axilla. No acute bone abnormality. IMPRESSION: 1. Moderate to large right pleural effusion, increased in size since prior examination. Electronically Signed   By: Fidela Salisbury M.D.   On: 11/11/2022 23:36   CT Angio Chest PE W and/or Wo Contrast  Result Date: 11/11/2022 CLINICAL DATA:  Lung cancer, pleural effusion. Pulmonary embolism (PE) suspected, low to intermediate prob, positive D-dimer EXAM: CT ANGIOGRAPHY CHEST WITH CONTRAST TECHNIQUE: Multidetector CT imaging of the chest was performed using the standard protocol during bolus administration of intravenous contrast. Multiplanar CT image reconstructions and MIPs were obtained to evaluate the vascular anatomy. RADIATION DOSE REDUCTION: This exam was performed according to the departmental dose-optimization program which includes automated exposure control, adjustment of the mA and/or kV according to patient size and/or use of iterative reconstruction technique. CONTRAST:  68m OMNIPAQUE IOHEXOL 350 MG/ML SOLN COMPARISON:  09/13/2022 FINDINGS: Cardiovascular: There are multiple small intraluminal filling defects identified within the segmental pulmonary arteries of the left upper lobe and left lower lobe in keeping with acute pulmonary emboli. The embolic burden is small. The central pulmonary arteries are of normal caliber. No CT evidence of right heart strain (RV/LV ratio 0.74). Mild coronary artery calcification. Global cardiac size within normal limits. No pericardial effusion. Mild atherosclerotic calcification within the thoracic aorta. No aortic aneurysm. Mediastinum/Nodes: Visualized thyroid is  unremarkable. No pathologic thoracic adenopathy. Right axillary lymph node dissection has been performed. Esophagus is unremarkable. Lungs/Pleura: Large right pleural effusion has enlarged since prior examination with near complete collapse of the right lower lobe. Right middle lobe pulmonary mass is difficult to accurately measure given superimposed pulmonary collapse but measures at least 2.7 x 5.3 cm at axial image # 51/5. Progressive right middle lobe volume loss. Progressive compressive atelectasis of the right upper lobe. Left lung is clear. No pneumothorax. No pleural effusion on the left. There is obliteration of the right middle lobar segmental pulmonary bronchi by the malignant mass, progressive since prior examination Upper Abdomen: No acute abnormality. Musculoskeletal: Interval development of a acute to subacute fracture of the left ninth rib laterally. No focal lytic or blastic bone lesions. Review of the MIP images confirms the above findings. IMPRESSION: 1. Acute pulmonary emboli within the segmental pulmonary arteries of the left upper lobe and left lower lobe. The embolic burden is small. No CT evidence of right heart strain. 2. Interval enlargement of a large right pleural effusion with near complete collapse of the right lower lobe. 3. Right middle lobe pulmonary mass, difficult to accurately measure given superimposed pulmonary collapse but similar in size measuring 5.3  x 2.7 cm. Progressive obliteration of the right middle lobe segmental pulmonary bronchi by the malignant mass with increasing right middle lobe volume loss. 4. Mild coronary artery calcification. 5. Acute to subacute fracture of the left ninth rib laterally. Aortic Atherosclerosis (ICD10-I70.0). Electronically Signed   By: Fidela Salisbury M.D.   On: 11/11/2022 23:34   DG Chest 2 View  Result Date: 11/11/2022 CLINICAL DATA:  Shortness of breath. EXAM: CHEST - 2 VIEW COMPARISON:  Chest radiograph dated 11/10/2022. FINDINGS:  Moderate right pleural effusion and right lung base atelectasis or infiltrate similar or slightly progressed since the prior radiograph. The left lung is clear. No pneumothorax. Stable cardiomediastinal silhouette. Atherosclerotic calcification of the aortic arch. No acute osseous pathology. IMPRESSION: Moderate right pleural effusion and right lung base atelectasis or infiltrate. Electronically Signed   By: Anner Crete M.D.   On: 11/11/2022 22:08   IR KYPHO LUMBAR INC FX REDUCE BONE BX UNI/BIL CANNULATION INC/IMAGING  Result Date: 11/07/2022 CLINICAL DATA:  Non-small cell lung cancer, L2 metastasis EXAM: L2 VERTEBRAL BODY BIOPSY, RADIOFREQUENCY (OsteoCool) ABLATION and KYPHOPLASTY AUGMENTATION COMPARISON:  CT and MR L-spine, 09/13/2022. MEDICATIONS: Ancef 2 g IV; The antibiotic was administered in an appropriate time interval prior to needle puncture of the skin. Dilaudid 1 mg IV. 0.5 % bupivacaine IM within the access level paraspinal muscles. Zofran 4 mg IV. ANESTHESIA/SEDATION: Moderate (conscious) sedation was employed during this procedure. A total of Versed 3 mg and Fentanyl 150 mcg was administered intravenously. Moderate Sedation Time: 110 minutes. The patient's level of consciousness and vital signs were monitored continuously by radiology nursing throughout the procedure under my direct supervision. FLUOROSCOPY TIME:  Fluoroscopic dose; 600 mGy COMPLICATIONS: None immediate. TECHNIQUE: Informed written consent was obtained from the patient after a thorough discussion of the procedural risks, benefits and alternatives. All questions were addressed. Maximal Sterile Barrier Technique was utilized including caps, mask, sterile gowns, sterile gloves, sterile drape, hand hygiene and skin antiseptic. A timeout was performed prior to the initiation of the procedure. The patient was placed prone on the fluoroscopic table. The skin overlying the lumbar spine region was then prepped and draped in the usual  sterile fashion. Maximal barrier sterile technique was utilized including caps, mask, sterile gowns, sterile gloves, sterile drape, hand hygiene and skin antiseptic. The RIGHT pedicle at L2 was then infiltrated with 1% lidocaine followed by the advancement of a Kyphon trocar needle through the RIGHT pedicle into the posterior one-third of the vertebral body. Subsequently, the osteo drill was advanced to the anterior third of the vertebral body. The osteo drill was retracted. Through the working cannula, a 15 mm OsteoCoolRF ablation probe was inserted and positioned under fluoroscopic guidance. In similar fashion, the LEFT L2 pedicle was infiltrated with 1% lidocaine. Utilizing a extra pedicular approach, a second Kyphon trocar needle was advanced into the posterior third of the vertebral body. Subsequently, the osteo drill was coaxially advanced to the anterior right third. The osteo drill was exchanged for a 15 mm OsteoCool RF ablation probe which was positioned under fluoroscopic guidance. With both OsteoCool ablation probes in place, the ablation was performed for 7.5 minutes. Attention was now paid towards the kyphoplasty portion of the procedure. A Kyphon inflatable bone tamp 15 x 3 was advanced through both working cannulas and positioned with the distal marker approximately 5 mm from the anterior aspect of the cortex. Appropriate positioning was confirmed on the AP projection. At this time, the balloon was expanded using contrast via a Kyphon  inflation syringe device via micro tubing. Inflations were continued under direct fluoroscopic guidance. At this time, methylmethacrylate mixture was reconstituted in the Kyphon bone mixing device system. This was then loaded into the delivery mechanism, attached to Kyphon bone fillers. The balloons were deflated and removed followed by the instillation of methylmethacrylate mixture with excellent filling in the AP and lateral projections. The working cannulae and the  bone filler were then retrieved and removed. Multiple spot radiographic images were obtained in various obliquities. Hemostasis was achieved with manual compression. The patient tolerated the procedure well without immediate postprocedural complication. FINDINGS: *Adequate cement filling of the L2 vertebral body on both the AP and lateral projections. *No extravasation was noted in the disk spaces or posteriorly into the spinal canal or along the pedicular access. *No epidural venous contamination was seen. IMPRESSION: Successful L2 vertebral body biopsy, radiofrequency "OsteoCool" ablation and bi-pedicular cement augmentation with balloon kyphoplasty, as above. PLAN: The patient will return to Vascular Interventional Radiology (VIR) for clinic follow-up in 4 weeks. Michaelle Birks, MD [facility] Radiology Electronically Signed   By: Michaelle Birks M.D.   On: 11/07/2022 17:24   IR Bone Tumor(s)RF Ablation  Result Date: 11/07/2022 CLINICAL DATA:  Non-small cell lung cancer, L2 metastasis EXAM: L2 VERTEBRAL BODY BIOPSY, RADIOFREQUENCY (OsteoCool) ABLATION and KYPHOPLASTY AUGMENTATION COMPARISON:  CT and MR L-spine, 09/13/2022. MEDICATIONS: Ancef 2 g IV; The antibiotic was administered in an appropriate time interval prior to needle puncture of the skin. Dilaudid 1 mg IV. 0.5 % bupivacaine IM within the access level paraspinal muscles. Zofran 4 mg IV. ANESTHESIA/SEDATION: Moderate (conscious) sedation was employed during this procedure. A total of Versed 3 mg and Fentanyl 150 mcg was administered intravenously. Moderate Sedation Time: 110 minutes. The patient's level of consciousness and vital signs were monitored continuously by radiology nursing throughout the procedure under my direct supervision. FLUOROSCOPY TIME:  Fluoroscopic dose; 248 mGy COMPLICATIONS: None immediate. TECHNIQUE: Informed written consent was obtained from the patient after a thorough discussion of  the procedural risks, benefits and alternatives. All questions were addressed. Maximal Sterile Barrier Technique was utilized including caps, mask, sterile gowns, sterile gloves, sterile drape, hand hygiene and skin antiseptic. A timeout was performed prior to the initiation of the procedure. The patient was placed prone on the fluoroscopic table. The skin overlying the lumbar spine region was then prepped and draped in the usual sterile fashion. Maximal barrier sterile technique was utilized including caps, mask, sterile gowns, sterile gloves, sterile drape, hand hygiene and skin antiseptic. The RIGHT pedicle at L2 was then infiltrated with 1% lidocaine followed by the advancement of a Kyphon trocar needle through the RIGHT pedicle into the posterior one-third of the vertebral body. Subsequently, the osteo drill was advanced to the anterior third of the vertebral body. The osteo drill was retracted. Through the working cannula, a 15 mm OsteoCoolRF ablation probe was inserted and positioned under fluoroscopic guidance. In similar fashion, the LEFT L2 pedicle was infiltrated with 1% lidocaine. Utilizing a extra pedicular approach, a second Kyphon trocar needle was advanced into the posterior third of the vertebral body. Subsequently, the osteo drill was coaxially advanced to the anterior right third. The osteo drill was exchanged for a 15 mm OsteoCool RF ablation probe which was positioned under fluoroscopic guidance. With both OsteoCool ablation probes in place, the ablation was performed for 7.5 minutes. Attention was now paid towards the kyphoplasty portion of the procedure. A Kyphon inflatable bone tamp 15 x 3 was  advanced through both working cannulas and positioned with the distal marker approximately 5 mm from the anterior aspect of the cortex. Appropriate positioning was confirmed on the AP projection. At this time, the balloon was expanded using contrast via a Kyphon inflation syringe device via micro  tubing. Inflations were continued under direct fluoroscopic guidance. At this time, methylmethacrylate mixture was reconstituted in the Kyphon bone mixing device system. This was then loaded into the delivery mechanism, attached to Kyphon bone fillers. The balloons were deflated and removed followed by the instillation of methylmethacrylate mixture with excellent filling in the AP and lateral projections. The working cannulae and the bone filler were then retrieved and removed. Multiple spot radiographic images were obtained in various obliquities. Hemostasis was achieved with manual compression. The patient tolerated the procedure well without immediate postprocedural complication. FINDINGS: *Adequate cement filling of the L2 vertebral body on both the AP and lateral projections. *No extravasation was noted in the disk spaces or posteriorly into the spinal canal or along the pedicular access. *No epidural venous contamination was seen. IMPRESSION: Successful L2 vertebral body biopsy, radiofrequency "OsteoCool" ablation and bi-pedicular cement augmentation with balloon kyphoplasty, as above. PLAN: The patient will return to Vascular Interventional Radiology (VIR) for clinic follow-up in 4 weeks. Michaelle Birks, MD [facility]iology [facility]ists [facility] Radiology Electronically Signed   By: Michaelle Birks M.D.   On: 11/07/2022 17:24   US Thoracentesis Asp Pleural space w/IMG guide  Result Date: 10/27/2022 INDICATION: Metastatic disease EXAM: ULTRASOUND GUIDED RIGHT THORACENTESIS MEDICATIONS: None. COMPLICATIONS: None immediate. PROCEDURE: An ultrasound guided thoracentesis was thoroughly discussed with the patient and questions answered. The benefits, risks, alternatives and complications were also discussed. The patient understands and wishes to proceed with the procedure. Written consent was obtained. Ultrasound was performed to localize and mark an adequate pocket of fluid in the right chest.  The area was then prepped and draped in the normal sterile fashion. 1% Lidocaine was used for local anesthesia. Under ultrasound guidance a 6 Fr Safe-T-Centesis catheter was introduced. Thoracentesis was performed. The catheter was removed and a dressing applied. FINDINGS: A total of approximately 1624m of amber fluid was removed. IMPRESSION: Successful ultrasound guided right thoracentesis yielding 16538mof pleural fluid. Performed and dictated by HaPasty SpillersPA-C Electronically Signed   By: M.Jerilynn Mages Shick M.D.   On: 10/27/2022 12:11   DG CHEST PORT 1 VIEW  Result Date: 10/27/2022 CLINICAL DATA:  S/P thoracentesis EXAM: PORTABLE CHEST 1 VIEW COMPARISON:  Chest x-ray 10/24/2022. FINDINGS: Decreased right pleural effusion. No visible pneumothorax. Mild right basilar opacities, likely atelectasis. Cardiomediastinal silhouette is similar. No acute osseous abnormality IMPRESSION: 1. Decreased right pleural effusion. No visible pneumothorax. 2. Probable right basilar atelectasis. Electronically Signed   By: FrMargaretha Sheffield.D.   On: 10/27/2022 11:35   IR Radiologist Eval & Mgmt  Result Date: 10/26/2022 EXAM: NEW PATIENT OFFICE VISIT CHIEF COMPLAINT: See below HISTORY OF PRESENT ILLNESS: See below REVIEW OF SYSTEMS: See below PHYSICAL EXAMINATION: See below ASSESSMENT AND PLAN: Please refer to completed note in the electronic medical record on CoPelahatchieugweru, MD [facility] Electronically Signed   By: JoMichaelle Birks.D.   On: 10/26/2022 11:53   DG Chest 2 View  Result Date: 10/24/2022 CLINICAL DATA:  Lung cancer.  Follow-up right pleural effusion. EXAM: CHEST - 2 VIEW COMPARISON:  September 21, 2022 FINDINGS: The heart size and mediastinal contours are stable. Moderate right pleural effusion is identified  significantly increasing compared to prior exam. Masslike lesion identified right mid lung unchanged. The left lung is clear. The  visualized skeletal structures are unremarkable. IMPRESSION: 1. Moderate right pleural effusion significantly increasing compared to prior exam. 2. Masslike lesion identified right mid lung unchanged. Electronically Signed   By: Abelardo Diesel M.D.   On: 10/24/2022 15:04    Labs:  CBC: Recent Labs    11/06/22 1226 11/11/22 2103 11/13/22 0449 11/14/22 0816  WBC 4.7 5.4 5.2 4.6  HGB 13.5 13.6 12.5 13.1  HCT 40.7 40.4 38.6 41.2  PLT 214 244 228 247    COAGS: Recent Labs    09/13/22 1321 11/02/22 0844 11/06/22 1226  INR 1.0 1.0 1.0    BMP: Recent Labs    11/11/22 2103 11/12/22 0653 11/13/22 0449 11/14/22 0816  NA 139 138 141 139  K 3.1* 3.4* 3.9 3.7  CL 103 102 108 106  CO2 _0 GLUCOSE 121* 96 107* 115*  BUN _1 CALCIUM 8.1* 7.9* 8.1* 8.4*  CREATININE 1.18* 1.13* 1.08* 1.26*  GFRNONAA 48* 50* 53* 44*    LIVER FUNCTION TESTS: Recent Labs    09/13/22 1321 11/12/22 0653 11/13/22 0449 11/14/22 0816  BILITOT 0.6 0.5 0.1* 0.4  AST _2 ALT _3 ALKPHOS 65 54 55 65  PROT 7.0 5.9* 5.6* 6.3*  ALBUMIN 3.3* 2.6* 2.4* 2.7*    TUMOR MARKERS: No results for input(s): "AFPTM", "CEA", "CA199", "CHROMGRNA" in the last 8760 hours.  Assessment and Plan:  Hx Lymphoma and follows with Dr Benay Spice New dx [place] 3 thoracentesis in last few months:  +malignant cells Planned for PleurX catheter placement 11/15/22 with PCCM Planned for [facility] placement in IR 11/14/22 Risks and benefits of image guided port-a-catheter placement was discussed with the patient including, but not limited to bleeding, infection, pneumothorax, or fibrin sheath development and need for additional procedures.  All of the patient's questions were answered, patient is agreeable to proceed. Consent signed and in chart.  Thank you for this interesting consult.  I greatly enjoyed meeting GUIDA ASMAN and look forward to participating in their care.  A copy of this  report was sent to the requesting provider on this date.  Electronically Signed: Lavonia Drafts, PA-C 11/14/2022, 2:47 PM   I spent a total of 20 Minutes    in face to face in clinical consultation, greater than 50% of which was counseling/coordinating care for [facility] a cath placement

## 2022-11-14 NOTE — Progress Notes (Signed)
This chaplain responded to chaplain referral for notarizing the Pt. Advance Directive:  HCPOA and Livning Will. The Pt. is awake and willing to participate in identifying and clarifying her responses to [facility] AD education.  The chaplain is present with the Pt., notary, and witnesses for notarizing the Pt. Advance Directive. The Pt. named Tommi Rumps as her healthcare agent. If this person is unwilling or unable to serve as healthcare agent, the Pt. next choice is Signe Colt.  The Pt. completed a Living Will.  The chaplain gave the original AD along with two copies to the Pt. The chaplain scanned a copy of the Pt. AD into the Pt. EMR.  This chaplain is available for F/U spiritual care as needed. The chaplain updated the Pt. RN and greeted the Pt. family member who is at the bedside at the end of the visit.  Chaplain Stephanie Acre [phone]

## 2022-11-14 NOTE — Consult Note (Signed)
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  She is amenable to this therefore her Eliquis will be stopped to be placed on a heparin drip and will be scheduled for Pleurx catheter in the near future.  Pertinent  Medical History   Past Medical History:  Diagnosis Date   Anxiety    Atherosclerosis of aorta ([place])    Breast cancer ([place]) 1998   (Rt) lumpectomy dx 1999; Dr. Benay Spice   Dyslipidemia, goal LDL below 130    Essential hypertension    GERD (gastroesophageal reflux disease)    Hypercholesterolemia    IBS (irritable bowel syndrome)    Lymphoma ([place])    lymphoma dx 11/28/10 - left neck   non hodgkins lymphoma 12/2010   right aprotid gland   Nonischemic cardiomyopathy ([place]) 2008   ? Doxorubincin induced; essentially resolved as of echo in January 2014, current EF 50-55%. Grade 1 diastolic dysfunction.   Obesity    Severe obesity (BMI >= 40) ([place]) 05/21/2013   Improve to BMI of 39 by July 2015   Tremor      Significant Hospital Events: Including procedures, antibiotic start and stop dates in addition to other pertinent events     Interim History / Subjective:  Interviewed no acute distress at rest  Objective   Blood pressure 126/77, pulse (!) 110, temperature  98.1 F (36.7 C), temperature source Oral, resp. rate 16, height _0  (1.626 m), weight 97.1 kg, SpO2 94 %.       No intake or output data in the 24 hours ending 11/14/22 1225 Filed Weights   11/11/22 2045  Weight: 97.1 kg    Examination: General: Obese female no acute distress HENT: No JVD is appreciated Lungs: Diminished in the bases Cardiovascular: Heart sounds are regular Abdomen: Obese soft nontender Extremities: Warm and dry with 1+ edema Neuro: Grossly intact without focal defect GU: Voids  Resolved Hospital Problem list     Assessment & Plan:  Recurrent malignant effusion right-sided due to recently diagnosed non-small cell lung cancer she is status post 3 thoracentesis and now requesting evaluation for Pleurx catheter. Stop Eliquis Start systemic heparin will be discontinued 2 hours prior to intervention Set up endoscopy suite for Pleurx catheter Pleurx catheter insertion procedure was explained in detail  History of breast cancer 1998 Chronic kidney disease Lab Results  Component Value Date   CREATININE 1.26 (H) 11/14/2022   CREATININE 1.08 (H) 11/13/2022   CREATININE 1.13 (H) 11/12/2022   CREATININE 1.07 12/08/2014   CREATININE 0.91 11/30/2014   CREATININE 1.44 (H) 11/20/2014   CREATININE 1.0 01/27/2014   CREATININE 1.0 12/23/2013   CREATININE 1.0 11/25/2013   History of PE on Eliquis New diagnosis of non-small cell lung cancer November 2023 with multiple thoracentesis for malignant pleural effusions Morbid obesity Per oncology and primary  Best Practice (right click and "Reselect all SmartList Selections" daily)   Diet/type: Regular consistency (see orders) DVT prophylaxis: systemic heparin GI prophylaxis: PPI Lines: N/A Foley:  N/A Code Status:  full code Last date of multidisciplinary goals of care discussion [tbd]  Labs   CBC: Recent Labs  Lab 11/11/22 2103 11/13/22 0449 11/14/22 0816  WBC 5.4 5.2 4.6  NEUTROABS 3.5  --   --    HGB 13.6 12.5 13.1  HCT 40.4 38.6 41.2  MCV 92.2 91.7 93.8  PLT 244 228 481    Basic Metabolic Panel: Recent Labs  Lab 11/11/22 2103 11/12/22 0653 11/13/22 0449 11/14/22 0816  NA 139 138 141 139  K 3.1* 3.4* 3.9 3.7  CL 103 102 108 106  CO2 _0 GLUCOSE 121* 96 107* 115*  BUN _1 CREATININE 1.18* 1.13* 1.08* 1.26*  CALCIUM 8.1* 7.9* 8.1* 8.4*  MG  --  1.6* 1.9  --    GFR: Estimated Creatinine Clearance: 42.3 mL/min (A) (by C-G formula based on SCr of 1.26 mg/dL (H)). Recent Labs  Lab 11/11/22 2103 11/13/22 0449 11/14/22 0816  WBC 5.4 5.2 4.6    Liver Function Tests: Recent Labs  Lab 11/12/22 0653 11/13/22 0449 11/14/22 0816  AST _2 ALT _3 ALKPHOS 54 55 65  BILITOT 0.5 0.1* 0.4  PROT 5.9* 5.6* 6.3*  ALBUMIN 2.6* 2.4* 2.7*   No results for input(s): "LIPASE", "AMYLASE" in the last 168 hours. No results for input(s): "AMMONIA" in the last 168 hours.  ABG No results found for: "PHART", "PCO2ART", "PO2ART", "HCO3", "TCO2", "ACIDBASEDEF", "O2SAT"   Coagulation Profile: No results for input(s): "INR", "PROTIME" in the last 168 hours.  Cardiac Enzymes: No results for input(s): "CKTOTAL", "CKMB", "CKMBINDEX", "TROPONINI" in the last 168 hours.  HbA1C: Hgb A1c MFr Bld  Date/Time Value Ref Range Status  09/04/2022 09:06 AM 5.6 4.8 - 5.6 % Final    Comment:             Prediabetes: 5.7 - 6.4          Diabetes: >6.4          Glycemic control for adults with diabetes: <7.0   08/30/2021 08:43 AM 6.0 (H) 4.8 - 5.6 % Final    Comment:             Prediabetes: 5.7 - 6.4          Diabetes: >6.4          Glycemic control for adults with diabetes: <7.0     CBG: No results for input(s): "GLUCAP" in the last 168 hours.  Review of Systems:   10 point review of system taken, please see HPI for positives and negatives.   Past Medical History:  She,  has a past medical history of Anxiety, Atherosclerosis of aorta ([place]), Breast  cancer ([place]) (1998), Dyslipidemia, goal LDL below 130, Essential hypertension, GERD (gastroesophageal reflux disease), Hypercholesterolemia, IBS (irritable bowel syndrome), Lymphoma ([place]), non hodgkins lymphoma (12/2010), Nonischemic cardiomyopathy ([place]) (2008), Obesity, Severe obesity (BMI >= 40) ([place]) (05/21/2013), and Tremor.   Surgical History:   Past Surgical History:  Procedure Laterality Date   ABDOMINAL HYSTERECTOMY     BSO   ANKLE FRACTURE SURGERY Left    BREAST EXCISIONAL BIOPSY Left    BREAST EXCISIONAL BIOPSY Right    BREAST LUMPECTOMY Right 1998   BREAST LUMPECTOMY Right 08/25/2020   Procedure: RIGHT BREAST LUMPECTOMY;  Surgeon: Coralie Keens, MD;  Location: [facility];  Service: General;  Laterality: Right;   CHOLECYSTECTOMY N/A 06/09/2020   Procedure: LAPAROSCOPIC CHOLECYSTECTOMY;  Surgeon: Coralie Keens, MD;  Location: [facility];  Service: General;  Laterality: N/A;   COLONOSCOPY     IR BONE TUMOR(S)RF ABLATION  11/06/2022   IR KYPHO LUMBAR INC FX REDUCE BONE BX UNI/BIL CANNULATION INC/IMAGING  11/06/2022   IR RADIOLOGIST EVAL & MGMT  10/26/2022   LEFT HEART CATH AND CORONARY ANGIOGRAPHY  12/2007   None coronary disease, EF 45% (up from nuclear study EF of 30% in June '08)   NM MYOVIEW LTD  03/29/2007   EF 33%,NEGATIVE ISCHEMIA, prob need cath   NM MYOVIEW LTD  05/01/2014   Lexiscan: EF 55%. Normal wall motion; no ischemia or infarction; apical thinning   PORTACATH PLACEMENT  08/25/2013   rt. with tip in cavoatrial junction, Dr.Yamagata    TRANSTHORACIC ECHOCARDIOGRAM  05/01/2014   Normal LV size with low normal function. EF of 50-55% and Gr 1 DD; aortic sclerosis without stenosis. MAC and thickening/calcification of the anterior leaflet - no notable AI / AS or MR/MS     Social History:   reports that she has never smoked. She has never used smokeless tobacco. She reports current alcohol use. She reports that she does not use drugs.   Family History:   Her family history includes Cancer in her paternal grandmother and sister; Chronic Renal Failure in her mother; Heart Problems in her father, maternal grandmother, mother, and paternal grandfather; Heart attack in her sister; Hypertension in her brother, sister, and sister; Lupus in her brother; Migraines in her sister; Stroke in her maternal grandfather.   Allergies Allergies  Allergen Reactions   Simvastatin     Leg cramps   Zoloft [Sertraline] Nausea Only   Codeine Other (See Comments)    Bad Headaches   Coreg Other (See Comments)    "Made my legs hurt"   Lisinopril Cough   Tizanidine Hcl Rash    hypotension     Home Medications  Prior to Admission medications   Medication Sig Start Date End Date Taking? Authorizing Provider  acetaminophen (TYLENOL) 325 MG tablet Take 2 tablets (650 mg total) by mouth every 6 (six) hours as needed for mild pain or moderate pain. 09/14/22  Yes Arrien, Jimmy Picket, MD  Ibuprofen-diphenhydrAMINE HCl (ADVIL PM) 200-25 MG CAPS Take 3 tablets by mouth at bedtime as needed (for sleep).   Yes [provider]  LORazepam (ATIVAN) 0.5 MG tablet Take 1 tablet (0.5 mg total) by mouth 2 (two) times daily as needed. Patient taking differently: Take 0.5 mg by mouth 2 (two) times daily as needed for anxiety. 10/31/22  Yes Ladell Pier, MD  metoprolol tartrate (LOPRESSOR) 50 MG tablet Take 50 mg by mouth 2 (two) times daily.   Yes [provider]  OVER THE COUNTER MEDICATION Take 1 capsule by mouth daily. Gallbladder Enzymes OTC   Yes [provider]  oxyCODONE (OXY IR/ROXICODONE) 5 MG immediate release tablet Take 1 tablet (5 mg total) by mouth every 6 (six) hours as needed for severe pain. 10/24/22  Yes Ladell Pier, MD     Critical care time: Ferol Luz Javarious Elsayed ACNP Acute Care Nurse Practitioner Mary[facility] Pulmonary/Critical Care Please consult Amion 11/14/2022, 12:25 PM
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
## 2022-11-14 NOTE — Progress Notes (Signed)
Initial Nutrition Assessment  DOCUMENTATION CODES:   Obesity unspecified  INTERVENTION:  - Add Acupuncturist TID, each packet mixed with 8 ounces of 2% milk provides 13 grams of protein and 260 calories.   - Add MVI q day.   NUTRITION DIAGNOSIS:   Inadequate oral intake related to poor appetite as evidenced by per patient/family report.  GOAL:   Patient will meet greater than or equal to 90% of their needs  MONITOR:   PO intake, Supplement acceptance  REASON FOR ASSESSMENT:   Malnutrition Screening Tool    ASSESSMENT:   77 y.o. female admits related to chest pain. PMH includes: breast cancer, dyslipidemia, HTN, GERD, IBS, lymphoma, non hodgkins lymphoma. Pt is currently receiving medical management for acute pulmonary embolism.  Meds reviewed. Labs reviewed.   The pt reports that she has not had a good appetite for about 6 weeks. No significant wt loss per record. The pt reports that she would be interested in CIB in chocolate flavor. RD will add supplement and continue to monitor PO intakes.   NUTRITION - FOCUSED PHYSICAL EXAM:  WNL - no wasting noted.   Diet Order:   Diet Order             Diet regular Room service appropriate? Yes; Fluid consistency: Thin  Diet effective now                   EDUCATION NEEDS:   Not appropriate for education at this time  Skin:  Skin Assessment: Reviewed RN Assessment  Last BM:  11/13/22  Height:   Ht Readings from Last 1 Encounters:  11/11/22 5\' 4"  (1.626 m)    Weight:   Wt Readings from Last 1 Encounters:  11/11/22 97.1 kg    Ideal Body Weight:     BMI:  Body mass index is 36.73 kg/m.  Estimated Nutritional Needs:   Kcal:  11/13/22 kcals  Protein:  95-120 gm  Fluid:  >/= 1.9 L  4580-9983, RD, LDN, CNSC.

## 2022-11-15 ENCOUNTER — Other Ambulatory Visit: Payer: Self-pay | Admitting: Oncology

## 2022-11-15 ENCOUNTER — Inpatient Hospital Stay (HOSPITAL_COMMUNITY): Payer: No Typology Code available for payment source

## 2022-11-15 DIAGNOSIS — C342 Malignant neoplasm of middle lobe, bronchus or lung: Secondary | ICD-10-CM | POA: Diagnosis not present

## 2022-11-15 DIAGNOSIS — I2694 Multiple subsegmental pulmonary emboli without acute cor pulmonale: Secondary | ICD-10-CM | POA: Diagnosis not present

## 2022-11-15 DIAGNOSIS — Z452 Encounter for adjustment and management of vascular access device: Secondary | ICD-10-CM | POA: Diagnosis not present

## 2022-11-15 DIAGNOSIS — C3490 Malignant neoplasm of unspecified part of unspecified bronchus or lung: Secondary | ICD-10-CM | POA: Diagnosis not present

## 2022-11-15 HISTORY — PX: IR IMAGING GUIDED PORT INSERTION: IMG5740

## 2022-11-15 LAB — CBC
HCT: 38.9 % (ref 36.0–46.0)
Hemoglobin: 12.3 g/dL (ref 12.0–15.0)
MCH: 29.9 pg (ref 26.0–34.0)
MCHC: 31.6 g/dL (ref 30.0–36.0)
MCV: 94.4 fL (ref 80.0–100.0)
Platelets: 229 K/uL (ref 150–400)
RBC: 4.12 MIL/uL (ref 3.87–5.11)
RDW: 15.4 % (ref 11.5–15.5)
WBC: 4 K/uL (ref 4.0–10.5)
nRBC: 0 % (ref 0.0–0.2)

## 2022-11-15 LAB — COMPREHENSIVE METABOLIC PANEL WITH GFR
ALT: 13 U/L (ref 0–44)
AST: 18 U/L (ref 15–41)
Albumin: 2.4 g/dL — ABNORMAL LOW (ref 3.5–5.0)
Alkaline Phosphatase: 54 U/L (ref 38–126)
Anion gap: 9 (ref 5–15)
BUN: 13 mg/dL (ref 8–23)
CO2: 25 mmol/L (ref 22–32)
Calcium: 8.1 mg/dL — ABNORMAL LOW (ref 8.9–10.3)
Chloride: 105 mmol/L (ref 98–111)
Creatinine, Ser: 1.21 mg/dL — ABNORMAL HIGH (ref 0.44–1.00)
GFR, Estimated: 46 mL/min — ABNORMAL LOW
Glucose, Bld: 97 mg/dL (ref 70–99)
Potassium: 3.8 mmol/L (ref 3.5–5.1)
Sodium: 139 mmol/L (ref 135–145)
Total Bilirubin: 0.7 mg/dL (ref 0.3–1.2)
Total Protein: 5.7 g/dL — ABNORMAL LOW (ref 6.5–8.1)

## 2022-11-15 LAB — APTT: aPTT: 57 seconds — ABNORMAL HIGH (ref 24–36)

## 2022-11-15 LAB — HEPARIN LEVEL (UNFRACTIONATED): Heparin Unfractionated: >1.1 IU/mL — ABNORMAL HIGH (ref 0.30–0.70)

## 2022-11-15 MED ORDER — FENTANYL CITRATE (PF) 100 MCG/2ML IJ SOLN
INTRAMUSCULAR | Status: AC
  Filled 2022-11-15: qty 2

## 2022-11-15 MED ORDER — FENTANYL CITRATE (PF) 100 MCG/2ML IJ SOLN
INTRAMUSCULAR | Status: AC | PRN
  Administered 2022-11-15: 50 ug via INTRAVENOUS
  Administered 2022-11-15 (×2): 25 ug via INTRAVENOUS

## 2022-11-15 MED ORDER — CYANOCOBALAMIN 1000 MCG/ML IJ SOLN
1000.0000 ug | Freq: Once | INTRAMUSCULAR | Status: DC
  Filled 2022-11-15 (×3): qty 1

## 2022-11-15 MED ORDER — MIDAZOLAM HCL 2 MG/2ML IJ SOLN
INTRAMUSCULAR | Status: AC
  Filled 2022-11-15: qty 2

## 2022-11-15 MED ORDER — HEPARIN SOD (PORK) LOCK FLUSH 100 UNIT/ML IV SOLN
INTRAVENOUS | Status: AC
  Filled 2022-11-15: qty 5

## 2022-11-15 MED ORDER — HEPARIN (PORCINE) 25000 UT/250ML-% IV SOLN
1250.0000 [IU]/h | INTRAVENOUS | Status: DC
  Administered 2022-11-15 (×2): 1400 [IU]/h via INTRAVENOUS
  Filled 2022-11-15: qty 250

## 2022-11-15 MED ORDER — MIDAZOLAM HCL 2 MG/2ML IJ SOLN
INTRAMUSCULAR | Status: AC | PRN
  Administered 2022-11-15 (×2): 1 mg via INTRAVENOUS

## 2022-11-15 MED ORDER — LIDOCAINE-EPINEPHRINE 1 %-1:100000 IJ SOLN
INTRAMUSCULAR | Status: AC
  Filled 2022-11-15: qty 1

## 2022-11-15 NOTE — Procedures (Signed)
Interventional Radiology Procedure Note  Procedure: RT IJ POWER PORT    Complications: None  Estimated Blood Loss:  MIN  Findings: TIP SVCRA    M. TREVOR Caiya Bettes, MD    
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
## 2022-11-15 NOTE — Progress Notes (Addendum)
Mobility Specialist Progress Note   11/15/22 0950  Mobility  Activity Ambulated with assistance in hallway;Dangled on edge of bed  Level of Assistance Standby assist, set-up cues, supervision of patient - no hands on  Assistive Device Front wheel walker  Distance Ambulated (ft) 260 ft  Range of Motion/Exercises Active;All extremities  Activity Response Tolerated well   Patient received in supine and agreeable to participate. Ambulated min guard to supervision with slow steady gait. Patient with improved activity tolerance, was able to extend distance compared to past sessions. Did required standing rest break secondary to 2/4 dyspnea and fatigue. Oxygen saturations maintained >93% on RA. Returned to room without complaint or incident. Was left dangling EOB with all needs met, call bell in reach.   Crystal [name], BS EXP Mobility Specialist Please contact via SecureChat or Rehab office at [phone]

## 2022-11-15 NOTE — Progress Notes (Signed)
START OFF PATHWAY REGIMEN - Non-Small Cell Lung   [id number]:Pembrolizumab 200 mg  IV D1 + Pemetrexed 500 mg/m2 IV D1 + Carboplatin AUC=5 IV D1 q21 Days:   A cycle is every 21 days:     Pembrolizumab      Pemetrexed      Carboplatin   **Always confirm dose/schedule in your pharmacy ordering system**  Patient Characteristics: Stage IV Metastatic, Nonsquamous, Awaiting Molecular Test Results and Need to Start Chemotherapy, PS = 0, 1 Therapeutic Status: Stage IV Metastatic Histology: Nonsquamous Cell Broad Molecular Profiling Status: Awaiting Molecular Test Results and Need to Start Chemotherapy ECOG Performance Status: 1 Intent of Therapy: Non-Curative / Palliative Intent, Discussed with Patient

## 2022-11-15 NOTE — Sedation Documentation (Signed)
Patient is resting comfortably. 

## 2022-11-15 NOTE — Progress Notes (Signed)
[place] for Apixaban to Heparin  Indication: pulmonary embolus  Allergies  Allergen Reactions   Simvastatin     Leg cramps   Zoloft [Sertraline] Nausea Only   Codeine Other (See Comments)    Bad Headaches   Coreg Other (See Comments)    "Made my legs hurt"   Lisinopril Cough   Tizanidine Hcl Rash    hypotension    Patient Measurements: Height: 5\' 4"  (162.6 cm) Weight: 97.1 kg (214 lb) IBW/kg (Calculated) : 54.7 Heparin Dosing Weight: 80kg  Vital Signs: Temp: 98.2 F (36.8 C) (01/24 0726) Temp Source: Oral (01/24 0726) BP: 122/85 (01/24 0726) Pulse Rate: 100 (01/24 0726)  Labs: Recent Labs    11/13/22 0449 11/14/22 0816 11/15/22 0715  HGB 12.5 13.1 12.3  HCT 38.6 41.2 38.9  PLT 228 247 229  APTT  --   --  57*  HEPARINUNFRC  --   --  >1.10*  CREATININE 1.08* 1.26* 1.21*     Estimated Creatinine Clearance: 44.1 mL/min (A) (by C-G formula based on SCr of 1.21 mg/dL (H)).   Medical History: Past Medical History:  Diagnosis Date   Anxiety    Atherosclerosis of aorta ([place])    Breast cancer ([place]) 1998   (Rt) lumpectomy dx 1999; Dr. Benay Spice   Dyslipidemia, goal LDL below 130    Essential hypertension    GERD (gastroesophageal reflux disease)    Hypercholesterolemia    IBS (irritable bowel syndrome)    Lymphoma ([place])    lymphoma dx 11/28/10 - left neck   non hodgkins lymphoma 12/2010   right aprotid gland   Nonischemic cardiomyopathy ([place]) 2008   ? Doxorubincin induced; essentially resolved as of echo in January 2014, current EF 50-55%. Grade 1 diastolic dysfunction.   Obesity    Severe obesity (BMI >= 40) ([place]) 05/21/2013   Improve to BMI of 39 by July 2015   Tremor     Assessment: 77yo female undergoing radiation for lung ca c/o CP and SOB x2d, CT reveals PE w/ small clot burden. Pharmacy consulted to dose and manage heparin infusion for acute PE on 1/20. Pharmacy consulted to transition pt to apixaban on  11/12/22.  Now converting back to heparin for pleur x cath per CCM  Last dose of apixaban at 10 am 1/23  PTT 57 seconds, HL > 1.10 ( as expected)   Goal of Therapy:  Heparin level 0.3-0.7 units/ml Monitor platelets by anticoagulation protocol: Yes PTT 66 to 102 seconds   Plan:  Heparin to 1400 units / hr Daily heparin level, PTT, CBC  Thank you Anette Guarneri, PharmD 11/15/2022 9:46 AM
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
## 2022-11-15 NOTE — Progress Notes (Signed)
ANTICOAGULATION CONSULT NOTE  Pharmacy Consult for Heparin Indication: pulmonary embolus  Allergies  Allergen Reactions   Simvastatin     Leg cramps   Zoloft [Sertraline] Nausea Only   Codeine Other (See Comments)    Bad Headaches   Coreg Other (See Comments)    "Made my legs hurt"   Lisinopril Cough   Tizanidine Hcl Rash    hypotension    Patient Measurements: Height: 5\' 4"  (162.6 cm) Weight: 97.1 kg (214 lb) IBW/kg (Calculated) : 54.7 Heparin Dosing Weight: 80 kg  Vital Signs: Temp: 98.2 F (36.8 C) (01/24 0726) Temp Source: Oral (01/24 0726) BP: 118/80 (01/24 1530) Pulse Rate: 95 (01/24 1530)  Labs: Recent Labs    11/13/22 0449 11/14/22 0816 11/15/22 0715  HGB 12.5 13.1 12.3  HCT 38.6 41.2 38.9  PLT 228 247 229  APTT  --   --  57*  HEPARINUNFRC  --   --  >1.10*  CREATININE 1.08* 1.26* 1.21*    Estimated Creatinine Clearance: 44.1 mL/min (A) (by C-G formula based on SCr of 1.21 mg/dL (H)).   Assessment: 77 yo female undergoing radiation for lung ca who presented with CP and SOB. CT reveals PE w/ small clot burden. Pharmacy consulted to dose and manage heparin infusion for acute PE on 1/20. Transitioned to apixaban on 11/12/22. Now converting back to heparin per CCM. Last dose of apixaban at 10 am 1/23. Will resume heparin infusion at previous rate at 5PM per consult note.  Goal of Therapy:  Heparin level 0.3-0.7 units/ml aPTT 66-102 seconds Monitor platelets by anticoagulation protocol: Yes   Plan:  Resume heparin infusion at 1400 units/hr at 5PM Check heparin level in 8 hours and daily while on heparin Continue to monitor H&H and platelets F/u transition back to apixaban when able    Thank you for allowing pharmacy to be a part of this patient's care.  Ardyth Harps, PharmD Clinical Pharmacist

## 2022-11-15 NOTE — Progress Notes (Signed)
CSW spoke with Amy at [facility] who states the agency cannot provide [facility] services due to the patient being scheduled to have a PleurX catheter placed tomorrow.  Madilyn Fireman, MSW, LCSW Transitions of Care  Clinical Social Worker II [phone]

## 2022-11-16 ENCOUNTER — Encounter (HOSPITAL_COMMUNITY)
Admission: EM | Disposition: A | Discharge status: Home / Self Care | Payer: Self-pay | Source: Home / Self Care | Attending: Internal Medicine

## 2022-11-16 ENCOUNTER — Other Ambulatory Visit: Payer: Self-pay

## 2022-11-16 ENCOUNTER — Other Ambulatory Visit: Payer: Self-pay | Admitting: Nurse Practitioner

## 2022-11-16 ENCOUNTER — Encounter (HOSPITAL_COMMUNITY): Payer: Self-pay | Admitting: Internal Medicine

## 2022-11-16 ENCOUNTER — Inpatient Hospital Stay (HOSPITAL_COMMUNITY): Payer: No Typology Code available for payment source

## 2022-11-16 DIAGNOSIS — C342 Malignant neoplasm of middle lobe, bronchus or lung: Secondary | ICD-10-CM | POA: Diagnosis not present

## 2022-11-16 DIAGNOSIS — J939 Pneumothorax, unspecified: Secondary | ICD-10-CM | POA: Diagnosis not present

## 2022-11-16 DIAGNOSIS — J9811 Atelectasis: Secondary | ICD-10-CM | POA: Diagnosis not present

## 2022-11-16 DIAGNOSIS — J90 Pleural effusion, not elsewhere classified: Secondary | ICD-10-CM | POA: Diagnosis not present

## 2022-11-16 DIAGNOSIS — J9601 Acute respiratory failure with hypoxia: Secondary | ICD-10-CM | POA: Diagnosis not present

## 2022-11-16 DIAGNOSIS — I2694 Multiple subsegmental pulmonary emboli without acute cor pulmonale: Secondary | ICD-10-CM | POA: Diagnosis not present

## 2022-11-16 DIAGNOSIS — R079 Chest pain, unspecified: Secondary | ICD-10-CM | POA: Diagnosis not present

## 2022-11-16 HISTORY — PX: CHEST TUBE INSERTION: SHX231

## 2022-11-16 LAB — COMPREHENSIVE METABOLIC PANEL
ALT: 12 U/L (ref 0–44)
AST: 18 U/L (ref 15–41)
Albumin: 2.5 g/dL — ABNORMAL LOW (ref 3.5–5.0)
Alkaline Phosphatase: 58 U/L (ref 38–126)
Anion gap: 5 (ref 5–15)
BUN: 15 mg/dL (ref 8–23)
CO2: 27 mmol/L (ref 22–32)
Calcium: 8.1 mg/dL — ABNORMAL LOW (ref 8.9–10.3)
Chloride: 107 mmol/L (ref 98–111)
Creatinine, Ser: 1.28 mg/dL — ABNORMAL HIGH (ref 0.44–1.00)
GFR, Estimated: 43 mL/min — ABNORMAL LOW (ref >60)
Glucose, Bld: 103 mg/dL — ABNORMAL HIGH (ref 70–99)
Potassium: 3.5 mmol/L (ref 3.5–5.1)
Sodium: 139 mmol/L (ref 135–145)
Total Bilirubin: 0.4 mg/dL (ref 0.3–1.2)
Total Protein: 5.6 g/dL — ABNORMAL LOW (ref 6.5–8.1)

## 2022-11-16 LAB — APTT
aPTT: 180 seconds (ref 24–36)
aPTT: 136 seconds — ABNORMAL HIGH (ref 24–36)

## 2022-11-16 LAB — CBC
HCT: 36.9 % (ref 36.0–46.0)
Hemoglobin: 12.1 g/dL (ref 12.0–15.0)
MCH: 30.3 pg (ref 26.0–34.0)
MCHC: 32.8 g/dL (ref 30.0–36.0)
MCV: 92.3 fL (ref 80.0–100.0)
Platelets: 225 10*3/uL (ref 150–400)
RBC: 4 MIL/uL (ref 3.87–5.11)
RDW: 15.2 % (ref 11.5–15.5)
WBC: 5.4 10*3/uL (ref 4.0–10.5)
nRBC: 0 % (ref 0.0–0.2)

## 2022-11-16 LAB — URINALYSIS, ROUTINE W REFLEX MICROSCOPIC
Glucose, UA: NEGATIVE mg/dL
Ketones, ur: NEGATIVE mg/dL
Nitrite: POSITIVE — AB
Protein, ur: 100 mg/dL — AB
Specific Gravity, Urine: 1.02 (ref 1.005–1.030)
pH: 6.5 (ref 5.0–8.0)

## 2022-11-16 LAB — URINALYSIS, MICROSCOPIC (REFLEX): RBC / HPF: >50 RBC/hpf (ref 0–5)

## 2022-11-16 LAB — HEPARIN LEVEL (UNFRACTIONATED): Heparin Unfractionated: >1.1 IU/mL — ABNORMAL HIGH (ref 0.30–0.70)

## 2022-11-16 SURGERY — INSERTION, PLEURAL DRAINAGE CATHETER
Anesthesia: IV Sedation (MBSC Only) | Laterality: Left

## 2022-11-16 MED ORDER — MIDAZOLAM HCL (PF) 5 MG/ML IJ SOLN
INTRAMUSCULAR | Status: AC
  Filled 2022-11-16: qty 1

## 2022-11-16 MED ORDER — MIDAZOLAM HCL 2 MG/2ML IJ SOLN: 2.0000 mg | Freq: Once | INTRAMUSCULAR | Status: DC

## 2022-11-16 MED ORDER — MIDAZOLAM HCL 5 MG/5ML IJ SOLN
INTRAMUSCULAR | Status: DC | PRN
  Administered 2022-11-16 (×2): 1 mg via INTRAVENOUS

## 2022-11-16 MED ORDER — HEPARIN (PORCINE) 25000 UT/250ML-% IV SOLN
1250.0000 [IU]/h | INTRAVENOUS | Status: DC
  Administered 2022-11-16 – 2022-11-17 (×2): 1250 [IU]/h via INTRAVENOUS
  Filled 2022-11-16: qty 250

## 2022-11-16 NOTE — Procedures (Signed)
PleurX Insertion Procedure Note  Crystal [name]  [id number]  22-Sep-1946  Date:11/16/22  Time:2:37 PM   Provider Performing:Amador Braddy Shearon Stalls  Procedure: PleurX Tunneled Pleural Catheter Placement (62947)  Indication(s) Relief of dyspnea from recurrent effusion  Consent Risks of the procedure as well as the alternatives and risks of each were explained to the patient and/or caregiver.  Consent for the procedure was obtained.   Anesthesia Topical only with 1% lidocaine    Time Out Verified patient identification, verified procedure, site/side was marked, verified correct patient position, special equipment/implants available, medications/allergies/relevant history reviewed, required imaging and test results available.   Sterile Technique Maximal sterile technique including sterile barrier drape, hand hygiene, sterile gown, sterile gloves, mask, hair covering.    Procedure Description Ultrasound used to identify appropriate pleural anatomy for placement and overlying skin marked.  Area of drainage cleaned and draped in sterile fashion.   Lidocaine was used to anesthetize the skin and subcutaneous tissue.   1.5 cm incision made overlying fluid and another about 5 cm anterior to this along chest wall.  PleurX catheter inserted in usual sterile fashion using modified seldinger technique.  Interrupted silk sutures placed at catheter insertion and tunneling points which will be removed at later date.  PleurX catheter then hooked to suction.  After fluid aspirated, pleurX capped and sterile dressing applied.   Complications/Tolerance None; patient tolerated the procedure well. Chest X-ray is ordered to confirm no post-procedural complication.   EBL Minimal   Specimen(s) none   Montey Hora, PA - C [facility] For pager details, please see AMION or use Epic chat  After 1900, please call [facility] for cross coverage needs 11/16/2022, 2:37 PM

## 2022-11-16 NOTE — Progress Notes (Signed)
ANTICOAGULATION CONSULT NOTE  Pharmacy Consult for Heparin Indication: pulmonary embolus  Allergies  Allergen Reactions   Simvastatin     Leg cramps   Zoloft [Sertraline] Nausea Only   Codeine Other (See Comments)    Bad Headaches   Coreg Other (See Comments)    "Made my legs hurt"   Lisinopril Cough   Tizanidine Hcl Rash    hypotension    Patient Measurements: Height: 5\' 4"  (162.6 cm) Weight: 97.1 kg (214 lb) IBW/kg (Calculated) : 54.7 Heparin Dosing Weight: 80 kg  Vital Signs: Temp: 98.1 F (36.7 C) (01/24 2028) Temp Source: Oral (01/24 2028) BP: 128/84 (01/25 0323) Pulse Rate: 96 (01/25 0323)  Labs: Recent Labs    11/14/22 0816 11/15/22 0715 11/16/22 0057 11/16/22 0420  HGB 13.1 12.3 12.1  --   HCT 41.2 38.9 36.9  --   PLT 247 229 225  --   APTT  --  57* 180* 136*  HEPARINUNFRC  --  >1.10* >1.10*  --   CREATININE 1.26* 1.21* 1.28*  --      Estimated Creatinine Clearance: 41.7 mL/min (A) (by C-G formula based on SCr of 1.28 mg/dL (H)).   Assessment: 77 yo female undergoing radiation for lung ca who presented with CP and SOB. CT reveals PE w/ small clot burden. Pharmacy consulted to dose and manage heparin infusion for acute PE on 1/20. Transitioned to apixaban on 11/12/22. Now converting back to heparin per CCM. Last dose of apixaban at 10 am 1/23. Will resume heparin infusion at previous rate at 5PM per consult note.  1/25 AM update:  aPTT is supra-therapeutic at 136  Goal of Therapy:  Heparin level 0.3-0.7 units/ml aPTT 66-102 seconds Monitor platelets by anticoagulation protocol: Yes   Plan:  Dec heparin to 1250 units/hr Heparin level and aPTT in 8 hours    Narda Bonds, PharmD, [place] Pharmacist Phone: [phone]

## 2022-11-16 NOTE — Progress Notes (Signed)
TRH night cross cover note:  Patient's scheduled evening dose of metoprolol is being held in the setting of soft blood pressure, with current heart rate 89.  No new symptoms reported at this time.    Babs Bertin, DO Hospitalist

## 2022-11-16 NOTE — Plan of Care (Signed)
  Problem: Education: Goal: Knowledge of General Education information will improve Description: Including pain rating scale, medication(s)/side effects and non-pharmacologic comfort measures Outcome: Progressing   Problem: Health Behavior/Discharge Planning: Goal: Ability to manage health-related needs will improve Outcome: Progressing   Problem: Clinical Measurements: Goal: Ability to maintain clinical measurements within normal limits will improve Outcome: Progressing Goal: Will remain free from infection Outcome: Progressing Goal: Respiratory complications will improve Outcome: Progressing Goal: Cardiovascular complication will be avoided Outcome: Progressing   Problem: Activity: Goal: Risk for activity intolerance will decrease Outcome: Progressing   Problem: Nutrition: Goal: Adequate nutrition will be maintained Outcome: Progressing   Problem: Coping: Goal: Level of anxiety will decrease Outcome: Progressing   Problem: Elimination: Goal: Will not experience complications related to bowel motility Outcome: Progressing Goal: Will not experience complications related to urinary retention Outcome: Progressing   Problem: Pain Managment: Goal: General experience of comfort will improve Outcome: Progressing   Problem: Safety: Goal: Ability to remain free from injury will improve Outcome: Progressing

## 2022-11-16 NOTE — Progress Notes (Signed)
Patient has been seen and examined with Richardson Landry Minor, NP. I agree with assessment and plan.   PleurX catheter placed today with out issues. 1L pleural fluid drained. PleurX supply sheet signed and placed in chart. Appreciate case management assistance.   Safe to resume eliquis tomorrow.  PCCM will continue to follow and see again tomorrow morning.  Freda Jackson, MD [facility] Office: [phone]     See Amion for personal pager PCCM on call pager [phone] until 7pm. Please call [facility] 7p-7a. [phone]
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
## 2022-11-16 NOTE — TOC Initial Note (Addendum)
Transition of Care [facility]) - Initial/Assessment Note    Patient Details  Name: RULA KENISTON MRN: [id number] Date of Birth: [birth year]  Transition of [facility]) CM/SW Contact:    Verdell Carmine, RN Phone Number: 11/16/2022, 10:40 AM  Clinical Narrative:                 77 year old patient  presented with [facility], Has history of breast Ca, now with Malignant lung/ pleural effusion. Plan to place a pluerex catheter.  She will need 10 pleurex drains on discharge.  [facility]- Cannot take [facility] Cannot take [facility]- No contract with [facility] no contract with [facility]- cannot take plurex  The plan will be to have the educator from Pulmonary teach  patient and family regarding pleurex and order supplies from [facility]. Called Fabienne Bruns  for education, After pleurex is placed, form for [facility] will be filled out. Need to make sure that pulmonary/ oncology who will follow for pleurex   TOC will continue to follow for needs, recommendations, and transitions of care. Expected Discharge Plan: Home/Self Care Barriers to Discharge: Continued Medical Work up   Patient Goals and CMS Choice            Expected Discharge Plan and Services   Discharge Planning Services: CM Consult                     DME Arranged:  (Will need supply of pluerex drains) DME Agency: [employer] Date DME Agency Contacted: 11/16/22 Time DME Agency Contacted: 9 Representative spoke with at DME Agency: Patriciaann Clan            Prior Living Arrangements/Services     Patient language and need for interpreter reviewed:: Yes        Need for Family Participation in Patient Care: Yes (Comment) Care giver support system in place?: Yes (comment)   Criminal Activity/Legal Involvement Pertinent to Current Situation/Hospitalization: No - Comment as needed  Activities of Daily Living Home Assistive Devices/Equipment: Eyeglasses ADL Screening (condition at time of admission) Patient's  cognitive ability adequate to safely complete daily activities?: Yes Is the patient deaf or have difficulty hearing?: No Does the patient have difficulty seeing, even when wearing glasses/contacts?: No Does the patient have difficulty concentrating, remembering, or making decisions?: No Patient able to express need for assistance with ADLs?: Yes Does the patient have difficulty dressing or bathing?: No Independently performs ADLs?: Yes (appropriate for developmental age) Does the patient have difficulty walking or climbing stairs?: No Weakness of Legs: None Weakness of Arms/Hands: None  Permission Sought/Granted                  Emotional Assessment              Admission diagnosis:  Acute pulmonary embolism ([place]) [I26.99] Recurrent right pleural effusion [J90] Multiple subsegmental pulmonary emboli without acute cor pulmonale (HCC) [I26.94] Malignant pleural effusion [J91.0] Patient Active Problem List   Diagnosis Date Noted   Acute pulmonary embolism ([place]) 11/12/2022   Acute hypoxemic respiratory failure ([place]) 11/12/2022   Metastatic non-small cell lung cancer ([place]) 11/12/2022   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   Rib fracture 11/12/2022   Anxiety 11/12/2022   CKD (chronic kidney disease), stage III ([place]) 11/12/2022   Chronic HFrEF (heart failure with reduced ejection fraction) ([place]) 11/12/2022   Metastasis to spinal column ([place]) 10/03/2022   Primary cancer of right middle lobe of lung ([place]) 09/29/2022   Intracranial bleed ([place])  09/13/2022   Lung mass 09/13/2022   Hypokalemia 09/13/2022   Tachycardia 08/29/2021   Preop cardiovascular exam 08/29/2020   Symptomatic cholelithiasis 06/09/2020   S/P laparoscopic cholecystectomy 06/09/2020   Chest pain with moderate risk for cardiac etiology 02/14/2018   Right upper quadrant pain 02/12/2018   Tremor 02/11/2014   Non Hodgkin's lymphoma ([place]) 08/18/2013   Neck pain on left side 05/30/2013   Dizziness 05/30/2013    Moderate obesity 05/21/2013   Lung crackles 05/21/2013   Dyspnea on exertion 05/21/2013   Nonischemic cardiomyopathy ([place])    Essential hypertension    Dyslipidemia, goal LDL below 100    PCP:  Gaynelle Arabian, MD Pharmacy:   [name] [place], [address] [place] [address] Phone: [phone] Fax: [phone]  [facility] [address] [place] [address] Phone: [phone] Fax: [phone]     Social Determinants of Health (SDOH) Social History: SDOH Screenings   Food Insecurity: No Food Insecurity (11/12/2022)  Housing: Low Risk  (11/12/2022)  Transportation Needs: No Transportation Needs (11/12/2022)  Utilities: Not At Risk (11/12/2022)  Tobacco Use: Low Risk  (11/15/2022)   SDOH Interventions:     Readmission Risk Interventions     No data to display
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
## 2022-11-16 NOTE — Progress Notes (Signed)
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  She is amenable to this therefore her Eliquis will be stopped to be placed on a heparin drip and will be scheduled for Pleurx catheter in the near future.  Pertinent  Medical History   Past Medical History:  Diagnosis Date   Anxiety    Atherosclerosis of aorta ([place])    Breast cancer ([place]) 1998   (Rt) lumpectomy dx 1999; Dr. Benay Spice   Dyslipidemia, goal LDL below 130    Essential hypertension    GERD (gastroesophageal reflux disease)    Hypercholesterolemia    IBS (irritable bowel syndrome)    Lymphoma ([place])    lymphoma dx 11/28/10 - left neck   non hodgkins lymphoma 12/2010   right aprotid gland   Nonischemic cardiomyopathy ([place]) 2008   ? Doxorubincin induced; essentially resolved as of echo in January 2014, current EF 50-55%. Grade 1 diastolic dysfunction.   Obesity    Severe obesity (BMI >= 40) ([place]) 05/21/2013   Improve to BMI of 39 by July 2015   Tremor      Significant Hospital Events: Including procedures, antibiotic start and stop dates in addition to other pertinent events   11/16/2022 Pleurx catheter insertion planned for 1300 hrs.  Interim History / Subjective:  Anxious about procedure once  sedation  Objective   Blood pressure 116/72, pulse 96, temperature 98.1 F (36.7 C), temperature source Oral, resp. rate 16, height 5\' 4"  (1.626 m), weight 97.1 kg, SpO2 94 %.        Intake/Output Summary (Last 24 hours) at 11/16/2022 1042 Last data filed at 11/15/2022 2322 Gross per 24 hour  Intake 206.67 ml  Output --  Net 206.67 ml   Filed Weights   11/11/22 2045  Weight: 97.1 kg    Examination: General: Elderly female no acute distress HEENT: MM pink/moist oropharynx is unremarkable Neuro: Grossly intact without focal defect CV: Heart sounds are regular PULM: Diminished breath sounds on the right   GI: soft, bsx4 active  GU: Voids Extremities: warm/dry, 1+ edema  Skin: no rashes or lesions   Resolved Hospital Problem list     Assessment & Plan:  Recurrent malignant effusion right-sided due to recently diagnosed non-small cell lung cancer she is status post 3 thoracentesis and now requesting evaluation for Pleurx catheter. Pleurx catheter to be inserted 11/16/2022 at 1300 hrs. Family will need training concerning drainage of pleural effusion Stop heparin 2 hours before procedure SHe has family members that are ER nurse and the [facility] who can assist with draining regular core.    History of breast cancer 1998 Chronic kidney disease Lab Results  Component Value Date   CREATININE 1.28 (H) 11/16/2022   CREATININE 1.21 (H) 11/15/2022   CREATININE 1.26 (H) 11/14/2022   CREATININE 1.07 12/08/2014   CREATININE 0.91 11/30/2014  CREATININE 1.44 (H) 11/20/2014   CREATININE 1.0 01/27/2014   CREATININE 1.0 12/23/2013   CREATININE 1.0 11/25/2013   History of PE on Eliquis New diagnosis of non-small cell lung cancer November 2023 with multiple thoracentesis for malignant pleural effusions Morbid obesity Per oncology and primary Port-A-Cath placed 11/15/2022 Plan to start chemotherapy per oncology  START OFF PATHWAY REGIMEN - Non-Small Cell Lung      [id number]:Pembrolizumab 200 mg  IV D1 + Pemetrexed 500 mg/m2 IV D1 + Carboplatin AUC=5 IV D1 q21 Days:   A cycle is every 21 days:     Pembrolizumab      Pemetrexed        Best Practice (right click and "Reselect all SmartList Selections" daily)   Diet/type: Regular consistency (see orders) DVT prophylaxis: systemic heparin GI prophylaxis: PPI Lines: N/A right Port-A-Cath placed 11/15/2022 Foley:  N/A Code Status:  full code Last date of multidisciplinary goals of care discussion [tbd]  Labs   CBC: Recent Labs  Lab 11/11/22 2103 11/13/22 0449 11/14/22 0816 11/15/22 0715 11/16/22 0057  WBC 5.4 5.2 4.6 4.0 5.4  NEUTROABS 3.5  --   --   --   --   HGB 13.6 12.5 13.1 12.3 12.1  HCT 40.4 38.6 41.2 38.9 36.9  MCV 92.2 91.7 93.8 94.4 92.3  PLT 244 228 247 229 785    Basic Metabolic Panel: Recent Labs  Lab 11/12/22 0653 11/13/22 0449 11/14/22 0816 11/15/22 0715 11/16/22 0057  NA 138 141 139 139 139  K 3.4* 3.9 3.7 3.8 3.5  CL 102 108 106 105 107  CO2 26 26 24 25 27   GLUCOSE 96 107* 115* 97 103*  BUN 12 9 9 13 15   CREATININE 1.13* 1.08* 1.26* 1.21* 1.28*  CALCIUM 7.9* 8.1* 8.4* 8.1* 8.1*  MG 1.6* 1.9  --   --   --    GFR: Estimated Creatinine Clearance: 41.7 mL/min (A) (by C-G formula based on SCr of 1.28 mg/dL (H)). Recent Labs  Lab 11/13/22 0449 11/14/22 0816 11/15/22 0715 11/16/22 0057  WBC 5.2 4.6 4.0 5.4    Liver Function Tests: Recent Labs  Lab 11/12/22 0653 11/13/22 0449 11/14/22 0816 11/15/22 0715 11/16/22 0057  AST 19 17 20 18 18   ALT 9 12 12 13 12   ALKPHOS 54 55 65 54 58  BILITOT 0.5 0.1* 0.4 0.7 0.4  PROT 5.9* 5.6* 6.3* 5.7* 5.6*  ALBUMIN 2.6* 2.4* 2.7* 2.4* 2.5*   No results for input(s): "LIPASE", "AMYLASE" in the last 168 hours. No results for input(s): "AMMONIA" in the last 168 hours.  ABG No results found for: "PHART", "PCO2ART", "PO2ART", "HCO3", "TCO2", "ACIDBASEDEF", "O2SAT"   Coagulation Profile: No results for input(s):  "INR", "PROTIME" in the last 168 hours.  Cardiac Enzymes: No results for input(s): "CKTOTAL", "CKMB", "CKMBINDEX", "TROPONINI" in the last 168 hours.  HbA1C: Hgb A1c MFr Bld  Date/Time Value Ref Range Status  09/04/2022 09:06 AM 5.6 4.8 - 5.6 % Final    Comment:             Prediabetes: 5.7 - 6.4          Diabetes: >6.4          Glycemic control for adults with diabetes: <7.0   08/30/2021 08:43 AM 6.0 (H) 4.8 - 5.6 % Final    Comment:             Prediabetes: 5.7 - 6.4          Diabetes: >6.4  Glycemic control for adults with diabetes: <7.0     CBG: No results for input(s): "GLUCAP" in the last 168 hours.   Richardson Landry Krisa Blattner ACNP Acute Care Nurse Practitioner [place] Please consult Amion 11/16/2022, 10:42 AM

## 2022-11-17 ENCOUNTER — Encounter: Payer: Self-pay | Admitting: Oncology

## 2022-11-17 ENCOUNTER — Other Ambulatory Visit (HOSPITAL_COMMUNITY): Payer: Self-pay

## 2022-11-17 ENCOUNTER — Encounter (HOSPITAL_COMMUNITY): Payer: Self-pay | Admitting: Pulmonary Disease

## 2022-11-17 DIAGNOSIS — J90 Pleural effusion, not elsewhere classified: Secondary | ICD-10-CM | POA: Diagnosis not present

## 2022-11-17 DIAGNOSIS — I2694 Multiple subsegmental pulmonary emboli without acute cor pulmonale: Secondary | ICD-10-CM | POA: Diagnosis not present

## 2022-11-17 DIAGNOSIS — C342 Malignant neoplasm of middle lobe, bronchus or lung: Secondary | ICD-10-CM | POA: Diagnosis not present

## 2022-11-17 LAB — CBC
HCT: 35.7 % — ABNORMAL LOW (ref 36.0–46.0)
Hemoglobin: 11.7 g/dL — ABNORMAL LOW (ref 12.0–15.0)
MCH: 29.9 pg (ref 26.0–34.0)
MCHC: 32.8 g/dL (ref 30.0–36.0)
MCV: 91.3 fL (ref 80.0–100.0)
Platelets: 223 10*3/uL (ref 150–400)
RBC: 3.91 MIL/uL (ref 3.87–5.11)
RDW: 15.2 % (ref 11.5–15.5)
WBC: 6 10*3/uL (ref 4.0–10.5)
nRBC: 0 % (ref 0.0–0.2)

## 2022-11-17 LAB — HEPARIN LEVEL (UNFRACTIONATED): Heparin Unfractionated: <0.1 IU/mL — ABNORMAL LOW (ref 0.30–0.70)

## 2022-11-17 LAB — APTT: aPTT: 115 seconds — ABNORMAL HIGH (ref 24–36)

## 2022-11-17 MED ORDER — SODIUM CHLORIDE 0.9 % IV SOLN
1.0000 g | Freq: Every day | INTRAVENOUS | Status: DC
  Administered 2022-11-17: 1 g via INTRAVENOUS
  Filled 2022-11-17 (×2): qty 10

## 2022-11-17 MED ORDER — APIXABAN 5 MG PO TABS
10.0000 mg | ORAL_TABLET | Freq: Two times a day (BID) | ORAL | Status: DC
  Administered 2022-11-17 – 2022-11-18 (×3): 10 mg via ORAL
  Filled 2022-11-17 (×3): qty 2

## 2022-11-17 MED ORDER — APIXABAN 5 MG PO TABS: 5.0000 mg | ORAL_TABLET | Freq: Two times a day (BID) | ORAL | Status: DC

## 2022-11-17 MED ORDER — METOPROLOL TARTRATE 100 MG PO TABS
100.0000 mg | ORAL_TABLET | Freq: Two times a day (BID) | ORAL | 0 refills | Status: DC
  Filled 2022-11-17: qty 60, 30d supply, fill #0

## 2022-11-17 MED ORDER — APIXABAN (ELIQUIS) VTE STARTER PACK (10MG AND 5MG)
ORAL_TABLET | ORAL | 0 refills | Status: DC
  Filled 2022-11-17: qty 74, 30d supply, fill #0

## 2022-11-17 MED ORDER — CEFDINIR 300 MG PO CAPS
300.0000 mg | ORAL_CAPSULE | Freq: Two times a day (BID) | ORAL | 0 refills | Status: AC
  Filled 2022-11-17: qty 10, 5d supply, fill #0

## 2022-11-17 NOTE — TOC Progression Note (Signed)
Discharge medications (3) are being stored in the main pharmacy on the ground floor until patient is ready for discharge.

## 2022-11-17 NOTE — Discharge Summary (Signed)
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 right thoracentesis. EXAM: ULTRASOUND GUIDED THERAPEUTIC RIGHT THORACENTESIS MEDICATIONS: 15 mL 1 % lidocaine COMPLICATIONS: None immediate. PROCEDURE: An ultrasound guided thoracentesis was thoroughly discussed with the patient and questions answered. The benefits, risks, alternatives and complications were also discussed. The patient understands and wishes to proceed with the procedure. Written consent was obtained. Ultrasound was performed to localize and mark an adequate pocket of fluid in the right chest. The area was then prepped and draped in the normal sterile fashion. 1% Lidocaine was used for local anesthesia. Under ultrasound guidance a 6 Fr Safe-T-Centesis catheter was introduced. Thoracentesis was performed. The catheter was removed and a dressing applied. FINDINGS: A  total of approximately 800 cc of clear, yellow  fluid was removed. IMPRESSION: Successful ultrasound guided right thoracentesis yielding 800 cc of pleural fluid. Read by: Narda Rutherford, AGNP-BC Electronically Signed   By: Miachel Roux M.D.   On: 11/12/2022 11:57   DG Chest 1 View  Result Date: 11/12/2022 CLINICAL DATA:  Post thoracentesis right lung. EXAM: CHEST  1 VIEW COMPARISON:  11/11/2022 FINDINGS: Lungs are hypoinflated as patient is post right thoracentesis. Interval improvement in patient's right pleural effusion. There is persistent moderate opacification over the right mid to lower lung likely combination of atelectasis and residual fluid. No pneumothorax. Left lung is clear. Cardiomediastinal silhouette and remainder of the exam is unchanged. IMPRESSION: Interval improvement in patient's right pleural effusion post thoracentesis. Persistent moderate opacification over the right mid to lower lung likely combination of atelectasis and residual fluid. Electronically Signed   By: Marin Olp M.D.   On: 11/12/2022 09:41   DG Chest 2 View  Result Date: 11/11/2022 CLINICAL DATA:  Lung cancer, right pleural effusion EXAM: CHEST - 2 VIEW COMPARISON:  10/27/2022 FINDINGS: Moderate to large right pleural effusion has increased in size since prior examination with compressive atelectasis of the right lung base. Left lung is clear. No pneumothorax. No pleural effusion on the left. Cardiac size within normal limits. Surgical clips are seen within the right axilla. No acute bone abnormality. IMPRESSION: 1. Moderate to large right pleural effusion, increased in size since prior examination. Electronically Signed   By: Fidela Salisbury M.D.   On: 11/11/2022 23:36   CT Angio Chest PE W and/or Wo Contrast  Result Date: 11/11/2022 CLINICAL DATA:  Lung cancer, pleural effusion. Pulmonary embolism (PE) suspected, low to intermediate prob, positive D-dimer EXAM: CT ANGIOGRAPHY CHEST WITH CONTRAST TECHNIQUE:  Multidetector CT imaging of the chest was performed using the standard protocol during bolus administration of intravenous contrast. Multiplanar CT image reconstructions and MIPs were obtained to evaluate the vascular anatomy. RADIATION DOSE REDUCTION: This exam was performed according to the departmental dose-optimization program which includes automated exposure control, adjustment of the mA and/or kV according to patient size and/or use of iterative reconstruction technique. CONTRAST:  37mL OMNIPAQUE IOHEXOL 350 MG/ML SOLN COMPARISON:  09/13/2022 FINDINGS: Cardiovascular: There are multiple small intraluminal filling defects identified within the segmental pulmonary arteries of the left upper lobe and left lower lobe in keeping with acute pulmonary emboli. The embolic burden is small. The central pulmonary arteries are of normal caliber. No CT evidence of right heart strain (RV/LV ratio 0.74). Mild coronary artery calcification. Global cardiac size within normal limits. No pericardial effusion. Mild atherosclerotic calcification within the thoracic aorta. No aortic aneurysm. Mediastinum/Nodes: Visualized thyroid is unremarkable. No pathologic thoracic adenopathy. Right axillary lymph node dissection has been performed. Esophagus is unremarkable. Lungs/Pleura: Large right pleural effusion has enlarged since prior examination with near complete collapse of the right lower lobe. Right middle lobe pulmonary mass is difficult to accurately measure given superimposed pulmonary collapse but measures at least 2.7 x 5.3 cm at axial image # 51/5. Progressive right middle lobe volume loss. Progressive compressive atelectasis of the right upper lobe. Left lung is clear. No pneumothorax. No pleural effusion on the left. There is obliteration of the right middle lobar segmental pulmonary bronchi by the malignant mass, progressive since prior examination Upper Abdomen: No acute abnormality. Musculoskeletal: Interval development  of a acute to subacute fracture of the left ninth rib laterally. No focal lytic or blastic bone lesions. Review of the MIP images confirms the above findings. IMPRESSION: 1. Acute pulmonary emboli  within the segmental pulmonary arteries of the left upper lobe and left lower lobe. The embolic burden is small. No CT evidence of right heart strain. 2. Interval enlargement of a large right pleural effusion with near complete collapse of the right lower lobe. 3. Right middle lobe pulmonary mass, difficult to accurately measure given superimposed pulmonary collapse but similar in size measuring 5.3 x 2.7 cm. Progressive obliteration of the right middle lobe segmental pulmonary bronchi by the malignant mass with increasing right middle lobe volume loss. 4. Mild coronary artery calcification. 5. Acute to subacute fracture of the left ninth rib laterally. Aortic Atherosclerosis (ICD10-I70.0). Electronically Signed   By: Fidela Salisbury M.D.   On: 11/11/2022 23:34   DG Chest 2 View  Result Date: 11/11/2022 CLINICAL DATA:  Shortness of breath. EXAM: CHEST - 2 VIEW COMPARISON:  Chest radiograph dated 11/10/2022. FINDINGS: Moderate right pleural effusion and right lung base atelectasis or infiltrate similar or slightly progressed since the prior radiograph. The left lung is clear. No pneumothorax. Stable cardiomediastinal silhouette. Atherosclerotic calcification of the aortic arch. No acute osseous pathology. IMPRESSION: Moderate right pleural effusion and right lung base atelectasis or infiltrate. Electronically Signed   By: Anner Crete M.D.   On: 11/11/2022 22:08   IR KYPHO LUMBAR INC FX REDUCE BONE BX UNI/BIL CANNULATION INC/IMAGING  Result Date: 11/07/2022 CLINICAL DATA:  Non-small cell lung cancer, L2 metastasis EXAM: L2 VERTEBRAL BODY BIOPSY, RADIOFREQUENCY (OsteoCool) ABLATION and KYPHOPLASTY AUGMENTATION COMPARISON:  CT and MR L-spine, 09/13/2022. MEDICATIONS: Ancef 2 g IV; The antibiotic was administered in  an appropriate time interval prior to needle puncture of the skin. Dilaudid 1 mg IV. 0.5 % bupivacaine IM within the access level paraspinal muscles. Zofran 4 mg IV. ANESTHESIA/SEDATION: Moderate (conscious) sedation was employed during this procedure. A total of Versed 3 mg and Fentanyl 150 mcg was administered intravenously. Moderate Sedation Time: 110 minutes. The patient's level of consciousness and vital signs were monitored continuously by radiology nursing throughout the procedure under my direct supervision. FLUOROSCOPY TIME:  Fluoroscopic dose; 102 mGy COMPLICATIONS: None immediate. TECHNIQUE: Informed written consent was obtained from the patient after a thorough discussion of the procedural risks, benefits and alternatives. All questions were addressed. Maximal Sterile Barrier Technique was utilized including caps, mask, sterile gowns, sterile gloves, sterile drape, hand hygiene and skin antiseptic. A timeout was performed prior to the initiation of the procedure. The patient was placed prone on the fluoroscopic table. The skin overlying the lumbar spine region was then prepped and draped in the usual sterile fashion. Maximal barrier sterile technique was utilized including caps, mask, sterile gowns, sterile gloves, sterile drape, hand hygiene and skin antiseptic. The RIGHT pedicle at L2 was then infiltrated with 1% lidocaine followed by the advancement of a Kyphon trocar needle through the RIGHT pedicle into the posterior one-third of the vertebral body. Subsequently, the osteo drill was advanced to the anterior third of the vertebral body. The osteo drill was retracted. Through the working cannula, a 15 mm OsteoCoolRF ablation probe was inserted and positioned under fluoroscopic guidance. In similar fashion, the LEFT L2 pedicle was infiltrated with 1% lidocaine. Utilizing a extra pedicular approach, a second Kyphon trocar needle was advanced into the posterior third of the vertebral body. Subsequently,  the osteo drill was coaxially advanced to the anterior right third. The osteo drill was exchanged for a 15 mm OsteoCool RF ablation probe which was positioned under fluoroscopic guidance. With both OsteoCool ablation probes in place, the ablation was performed  for 7.5 minutes. Attention was now paid towards the kyphoplasty portion of the procedure. A Kyphon inflatable bone tamp 15 x 3 was advanced through both working cannulas and positioned with the distal marker approximately 5 mm from the anterior aspect of the cortex. Appropriate positioning was confirmed on the AP projection. At this time, the balloon was expanded using contrast via a Kyphon inflation syringe device via micro tubing. Inflations were continued under direct fluoroscopic guidance. At this time, methylmethacrylate mixture was reconstituted in the Kyphon bone mixing device system. This was then loaded into the delivery mechanism, attached to Kyphon bone fillers. The balloons were deflated and removed followed by the instillation of methylmethacrylate mixture with excellent filling in the AP and lateral projections. The working cannulae and the bone filler were then retrieved and removed. Multiple spot radiographic images were obtained in various obliquities. Hemostasis was achieved with manual compression. The patient tolerated the procedure well without immediate postprocedural complication. FINDINGS: *Adequate cement filling of the L2 vertebral body on both the AP and lateral projections. *No extravasation was noted in the disk spaces or posteriorly into the spinal canal or along the pedicular access. *No epidural venous contamination was seen. IMPRESSION: Successful L2 vertebral body biopsy, radiofrequency "OsteoCool" ablation and bi-pedicular cement augmentation with balloon kyphoplasty, as above. PLAN: The patient will return to Vascular Interventional Radiology (VIR) for clinic follow-up in 4 weeks. Michaelle Birks, MD [facility] Radiology Electronically Signed   By: Michaelle Birks M.D.   On: 11/07/2022 17:24   IR Bone Tumor(s)RF Ablation  Result Date: 11/07/2022 CLINICAL DATA:  Non-small cell lung cancer, L2 metastasis EXAM: L2 VERTEBRAL BODY BIOPSY, RADIOFREQUENCY (OsteoCool) ABLATION and KYPHOPLASTY AUGMENTATION COMPARISON:  CT and MR L-spine, 09/13/2022. MEDICATIONS: Ancef 2 g IV; The antibiotic was administered in an appropriate time interval prior to needle puncture of the skin. Dilaudid 1 mg IV. 0.5 % bupivacaine IM within the access level paraspinal muscles. Zofran 4 mg IV. ANESTHESIA/SEDATION: Moderate (conscious) sedation was employed during this procedure. A total of Versed 3 mg and Fentanyl 150 mcg was administered intravenously. Moderate Sedation Time: 110 minutes. The patient's level of consciousness and vital signs were monitored continuously by radiology nursing throughout the procedure under my direct supervision. FLUOROSCOPY TIME:  Fluoroscopic dose; 944 mGy COMPLICATIONS: None immediate. TECHNIQUE: Informed written consent was obtained from the patient after a thorough discussion of the procedural risks, benefits and alternatives. All questions were addressed. Maximal Sterile Barrier Technique was utilized including caps, mask, sterile gowns, sterile gloves, sterile drape, hand hygiene and skin antiseptic. A timeout was performed prior to the initiation of the procedure. The patient was placed prone on the fluoroscopic table. The skin overlying the lumbar spine region was then prepped and draped in the usual sterile fashion. Maximal barrier sterile technique was utilized including caps, mask, sterile gowns, sterile gloves, sterile drape, hand hygiene and skin antiseptic. The RIGHT pedicle at L2 was then infiltrated with 1% lidocaine followed by the advancement of a Kyphon trocar needle through the RIGHT pedicle into the posterior one-third of the vertebral body. Subsequently, the osteo  drill was advanced to the anterior third of the vertebral body. The osteo drill was retracted. Through the working cannula, a 15 mm OsteoCoolRF ablation probe was inserted and positioned under fluoroscopic guidance. In similar fashion, the LEFT L2 pedicle was infiltrated with 1% lidocaine. Utilizing a extra pedicular approach, a second Kyphon trocar needle was advanced into the posterior third of the vertebral body. Subsequently,  the osteo drill was coaxially advanced to the anterior right third. The osteo drill was exchanged for a 15 mm OsteoCool RF ablation probe which was positioned under fluoroscopic guidance. With both OsteoCool ablation probes in place, the ablation was performed for 7.5 minutes. Attention was now paid towards the kyphoplasty portion of the procedure. A Kyphon inflatable bone tamp 15 x 3 was advanced through both working cannulas and positioned with the distal marker approximately 5 mm from the anterior aspect of the cortex. Appropriate positioning was confirmed on the AP projection. At this time, the balloon was expanded using contrast via a Kyphon inflation syringe device via micro tubing. Inflations were continued under direct fluoroscopic guidance. At this time, methylmethacrylate mixture was reconstituted in the Kyphon bone mixing device system. This was then loaded into the delivery mechanism, attached to Kyphon bone fillers. The balloons were deflated and removed followed by the instillation of methylmethacrylate mixture with excellent filling in the AP and lateral projections. The working cannulae and the bone filler were then retrieved and removed. Multiple spot radiographic images were obtained in various obliquities. Hemostasis was achieved with manual compression. The patient tolerated the procedure well without immediate postprocedural complication. FINDINGS: *Adequate cement filling of the L2 vertebral body on both the AP and lateral projections. *No extravasation was noted in  the disk spaces or posteriorly into the spinal canal or along the pedicular access. *No epidural venous contamination was seen. IMPRESSION: Successful L2 vertebral body biopsy, radiofrequency "OsteoCool" ablation and bi-pedicular cement augmentation with balloon kyphoplasty, as above. PLAN: The patient will return to Vascular Interventional Radiology (VIR) for clinic follow-up in 4 weeks. Michaelle Birks, MD [facility] Radiology Electronically Signed   By: Michaelle Birks M.D.   On: 11/07/2022 17:24   US Thoracentesis Asp Pleural space w/IMG guide  Result Date: 10/27/2022 INDICATION: Metastatic disease EXAM: ULTRASOUND GUIDED RIGHT THORACENTESIS MEDICATIONS: None. COMPLICATIONS: None immediate. PROCEDURE: An ultrasound guided thoracentesis was thoroughly discussed with the patient and questions answered. The benefits, risks, alternatives and complications were also discussed. The patient understands and wishes to proceed with the procedure. Written consent was obtained. Ultrasound was performed to localize and mark an adequate pocket of fluid in the right chest. The area was then prepped and draped in the normal sterile fashion. 1% Lidocaine was used for local anesthesia. Under ultrasound guidance a 6 Fr Safe-T-Centesis catheter was introduced. Thoracentesis was performed. The catheter was removed and a dressing applied. FINDINGS: A total of approximately 1637ml of amber fluid was removed. IMPRESSION: Successful ultrasound guided right thoracentesis yielding 1657ml of pleural fluid. Performed and dictated by Pasty Spillers, PA-C Electronically Signed   By: Jerilynn Mages.  Shick M.D.   On: 10/27/2022 12:11   DG CHEST PORT 1 VIEW  Result Date: 10/27/2022 CLINICAL DATA:  S/P thoracentesis EXAM: PORTABLE CHEST 1 VIEW COMPARISON:  Chest x-ray 10/24/2022. FINDINGS: Decreased right pleural effusion. No visible pneumothorax. Mild right basilar opacities, likely atelectasis.  Cardiomediastinal silhouette is similar. No acute osseous abnormality IMPRESSION: 1. Decreased right pleural effusion. No visible pneumothorax. 2. Probable right basilar atelectasis. Electronically Signed   By: Margaretha Sheffield M.D.   On: 10/27/2022 11:35   IR Radiologist Eval & Mgmt  Result Date: 10/26/2022 EXAM: NEW PATIENT OFFICE VISIT CHIEF COMPLAINT: See below HISTORY OF PRESENT ILLNESS: See below REVIEW OF SYSTEMS: See below PHYSICAL EXAMINATION: See below ASSESSMENT AND PLAN: Please refer to completed note in the electronic medical record on [place], MD [facility] Radiology Electronically Signed   By: Michaelle Birks M.D.   On: 10/26/2022 11:53   DG Chest 2 View  Result Date: 10/24/2022 CLINICAL DATA:  Lung cancer.  Follow-up right pleural effusion. EXAM: CHEST - 2 VIEW COMPARISON:  September 21, 2022 FINDINGS: The heart size and mediastinal contours are stable. Moderate right pleural effusion is identified significantly increasing compared to prior exam. Masslike lesion identified right mid lung unchanged. The left lung is clear. The visualized skeletal structures are unremarkable. IMPRESSION: 1. Moderate right pleural effusion significantly increasing compared to prior exam. 2. Masslike lesion identified right mid lung unchanged. Electronically Signed   By: Abelardo Diesel M.D.   On: 10/24/2022 15:04    Microbiology: Results for orders placed or performed during the hospital encounter of 08/21/20  SARS CORONAVIRUS 2 (TAT 6-24 HRS) Nasopharyngeal Nasopharyngeal Swab     Status: None   Collection Time: 08/21/20  1:50 PM   Specimen: Nasopharyngeal Swab  Result Value Ref Range Status   SARS Coronavirus 2 NEGATIVE NEGATIVE Final    Comment: (NOTE) SARS-CoV-2 target nucleic acids are NOT DETECTED.  The SARS-CoV-2 RNA is generally detectable in upper and lower respiratory specimens during the acute phase of infection.  Negative results do not preclude SARS-CoV-2 infection, do not rule out co-infections with other pathogens, and should not be used as the sole basis for treatment or other patient management decisions. Negative results must be combined with clinical observations, patient history, and epidemiological information. The expected result is Negative.  Fact Sheet for Patients: SugarRoll.be  Fact Sheet for Healthcare Providers: https://www.woods-mathews.com/  This test is not yet approved or cleared by the [place] FDA and  has been authorized for detection and/or diagnosis of SARS-CoV-2 by FDA under an Emergency Use Authorization (EUA). This EUA will remain  in effect (meaning this test can be used) for the duration of the COVID-19 declaration under Se ction 564(b)(1) of the Act, 21 U.S.C. section 360bbb-3(b)(1), unless the authorization is terminated or revoked sooner.  Performed at [facility], [place] [address], [place], [place] [address]     Labs: CBC: Recent Labs  Lab 11/11/22 2103 11/13/22 0449 11/14/22 0816 11/15/22 0715 11/16/22 0057 11/17/22 0638 11/18/22 0422  WBC 5.4   < > 4.6 4.0 5.4 6.0 4.8  NEUTROABS 3.5  --   --   --   --   --   --   HGB 13.6   < > 13.1 12.3 12.1 11.7* 11.2*  HCT 40.4   < > 41.2 38.9 36.9 35.7* 35.3*  MCV 92.2   < > 93.8 94.4 92.3 91.3 94.1  PLT 244   < > 247 229 225 223 194   < > = values in this interval not displayed.   Basic Metabolic Panel: Recent Labs  Lab 11/12/22 0653 11/13/22 0449 11/14/22 0816 11/15/22 0715 11/16/22 0057  NA 138 141 139 139 139  K 3.4* 3.9 3.7 3.8 3.5  CL 102 108 106 105 107  CO2 26 26 24 25 27   GLUCOSE 96 107* 115* 97 103*  BUN 12 9 9 13 15   CREATININE 1.13* 1.08* 1.26* 1.21* 1.28*  CALCIUM 7.9* 8.1* 8.4* 8.1* 8.1*  MG 1.6* 1.9  --   --   --    Liver Function Tests: Recent Labs  Lab 11/12/22 0653 11/13/22 0449 11/14/22 0816 11/15/22 0715  11/16/22 0057  AST 19 17 20 18 18   ALT 9 12 12 13  12  ALKPHOS 54 55 65 54 58  BILITOT 0.5 0.1* 0.4 0.7 0.4  PROT 5.9* 5.6* 6.3* 5.7* 5.6*  ALBUMIN 2.6* 2.4* 2.7* 2.4* 2.5*   CBG: No results for input(s): "GLUCAP" in the last 168 hours.  Discharge time spent: less than 30 minutes.  Signed: Marylu Lund, MD [facility] 11/18/2022

## 2022-11-17 NOTE — Progress Notes (Addendum)
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  She is amenable to this therefore her Eliquis will be stopped to be placed on a heparin drip and will be scheduled for Pleurx catheter in the near future.  Pertinent  Medical History   Past Medical History:  Diagnosis Date   Anxiety    Atherosclerosis of aorta ([place])    Breast cancer ([place]) 1998   (Rt) lumpectomy dx 1999; Dr. Benay Spice   Dyslipidemia, goal LDL below 130    Essential hypertension    GERD (gastroesophageal reflux disease)    Hypercholesterolemia    IBS (irritable bowel syndrome)    Lymphoma ([place])    lymphoma dx 11/28/10 - left neck   non hodgkins lymphoma 12/2010   right aprotid gland   Nonischemic cardiomyopathy ([place]) 2008   ? Doxorubincin induced; essentially resolved as of echo in January 2014, current EF 50-55%. Grade 1 diastolic dysfunction.   Obesity    Severe obesity (BMI >= 40) ([place]) 05/21/2013   Improve to BMI of 39 by July 2015   Tremor      Significant Hospital Events: Including procedures, antibiotic start and stop dates in addition to other pertinent events   11/16/2022 Pleurx catheter insertion  1300 hrs.  Interim History / Subjective:  Anxious about procedure once sedation  Objective   Blood  pressure 109/64, pulse (!) 110, temperature 98.6 F (37 C), temperature source Oral, resp. rate 16, height 5\' 4"  (1.626 m), weight 97.1 kg, SpO2 90 %.        Intake/Output Summary (Last 24 hours) at 11/17/2022 1043 Last data filed at 11/17/2022 0607 Gross per 24 hour  Intake 137.71 ml  Output 1100 ml  Net -962.29 ml   Filed Weights   11/11/22 2045  Weight: 97.1 kg    Examination: General: Elderly female in no acute distress HEENT: MM pink/moist Neuro: Sleepy but intact CV: Heart sounds are regular PULM: Diminished in the bases right greater than left, chest tube site unremarkable GI: soft, bsx4 active   Extremities: warm/dry,  edema  Skin: no rashes or lesions   Resolved Hospital Problem list     Assessment & Plan:  Recurrent malignant effusion right-sided due to recently diagnosed non-small cell lung cancer she is status post 3 thoracentesis and now requesting evaluation for Pleurx catheter. Pleurx catheter  inserted 11/16/2022 at 1300 hrs. 1000 cc drained Family will need training concerning drainage of pleural effusion Arrange for in-house training of family and drainage of Pleurx tube Home supplies for drainage of Pleurx tube She has a follow-up appointment with Dr. Madie Reno has been arranged.  She will need a chest x-ray at that time. 10 pluerex drain kits ordered for dc.   History of breast cancer 1998 Chronic kidney disease Lab Results  Component Value Date   CREATININE 1.28 (H) 11/16/2022  CREATININE 1.21 (H) 11/15/2022   CREATININE 1.26 (H) 11/14/2022   CREATININE 1.07 12/08/2014   CREATININE 0.91 11/30/2014   CREATININE 1.44 (H) 11/20/2014   CREATININE 1.0 01/27/2014   CREATININE 1.0 12/23/2013   CREATININE 1.0 11/25/2013   History of PE on Eliquis New diagnosis of non-small cell lung cancer November 2023 with multiple thoracentesis for malignant pleural effusions Morbid obesity Per oncology and primary Port-A-Cath placed 11/15/2022 Plan to start  chemotherapy per oncology  START OFF PATHWAY REGIMEN - Non-Small Cell Lung     [id number]:Pembrolizumab 200 mg  IV D1 + Pemetrexed 500 mg/m2 IV D1 + Carboplatin AUC=5 IV D1 q21 Days:   A cycle is every 21 days:     Pembrolizumab      Pemetrexed        Best Practice (right click and "Reselect all SmartList Selections" daily)   Diet/type: Regular consistency (see orders) DVT prophylaxis: systemic heparin GI prophylaxis: PPI Lines: N/A right Port-A-Cath placed 11/15/2022 Foley:  N/A Code Status:  full code Last date of multidisciplinary goals of care discussion tbd  Labs   CBC: Recent Labs  Lab 11/11/22 2103 11/13/22 0449 11/14/22 0816 11/15/22 0715 11/16/22 0057 11/17/22 0638  WBC 5.4 5.2 4.6 4.0 5.4 6.0  NEUTROABS 3.5  --   --   --   --   --   HGB 13.6 12.5 13.1 12.3 12.1 11.7*  HCT 40.4 38.6 41.2 38.9 36.9 35.7*  MCV 92.2 91.7 93.8 94.4 92.3 91.3  PLT 244 228 247 229 225 546    Basic Metabolic Panel: Recent Labs  Lab 11/12/22 0653 11/13/22 0449 11/14/22 0816 11/15/22 0715 11/16/22 0057  NA 138 141 139 139 139  K 3.4* 3.9 3.7 3.8 3.5  CL 102 108 106 105 107  CO2 26 26 24 25 27   GLUCOSE 96 107* 115* 97 103*  BUN 12 9 9 13 15   CREATININE 1.13* 1.08* 1.26* 1.21* 1.28*  CALCIUM 7.9* 8.1* 8.4* 8.1* 8.1*  MG 1.6* 1.9  --   --   --    GFR: Estimated Creatinine Clearance: 41.7 mL/min (A) (by C-G formula based on SCr of 1.28 mg/dL (H)). Recent Labs  Lab 11/14/22 0816 11/15/22 0715 11/16/22 0057 11/17/22 0638  WBC 4.6 4.0 5.4 6.0    Liver Function Tests: Recent Labs  Lab 11/12/22 0653 11/13/22 0449 11/14/22 0816 11/15/22 0715 11/16/22 0057  AST 19 17 20 18 18   ALT 9 12 12 13 12   ALKPHOS 54 55 65 54 58  BILITOT 0.5 0.1* 0.4 0.7 0.4  PROT 5.9* 5.6* 6.3* 5.7* 5.6*  ALBUMIN 2.6* 2.4* 2.7* 2.4* 2.5*   No results for input(s): "LIPASE", "AMYLASE" in the last 168 hours. No results for input(s): "AMMONIA" in the last 168 hours.  ABG No results found  for: "PHART", "PCO2ART", "PO2ART", "HCO3", "TCO2", "ACIDBASEDEF", "O2SAT"   Coagulation Profile: No results for input(s): "INR", "PROTIME" in the last 168 hours.  Cardiac Enzymes: No results for input(s): "CKTOTAL", "CKMB", "CKMBINDEX", "TROPONINI" in the last 168 hours.  HbA1C: Hgb A1c MFr Bld  Date/Time Value Ref Range Status  09/04/2022 09:06 AM 5.6 4.8 - 5.6 % Final    Comment:             Prediabetes: 5.7 - 6.4          Diabetes: >6.4          Glycemic control for adults with diabetes: <7.0   08/30/2021 08:43 AM 6.0 (H) 4.8 - 5.6 %  Final    Comment:             Prediabetes: 5.7 - 6.4          Diabetes: >6.4          Glycemic control for adults with diabetes: <7.0     CBG: No results for input(s): "GLUCAP" in the last 168 hours.   Richardson Landry Daylin Eads ACNP Acute Care Nurse Practitioner [place] Please consult Amion 11/17/2022, 10:43 AM

## 2022-11-17 NOTE — Progress Notes (Signed)
TRH night cross cover note:  I added the following hold parameters to existing order for metoprolol tartrate: Please hold for systolic blood pressure less than 90 mmHg or for heart rate less than 60 bpm.    Babs Bertin, DO Hospitalist

## 2022-11-17 NOTE — Progress Notes (Signed)
Provided education on pleurX drainage procedure to patient and her niece. I went over the procedure, showed the dressing change kit, the PleurX catheter, and the drainage bottle - how to connect/disconnect and how to apply the dressing. I answered all questions from Crystal [name] and her niece. They did not wish to see the PleurX patient education video at this time and feel comfortable they can help with draining at home.

## 2022-11-17 NOTE — Progress Notes (Addendum)
Medications picked up from Pharmacy for patient this evening. Dr. Wyline Copas notified that patient does not have a pleural vac drainage schedule. Per Dr. Geryl Councilman PCCM needs to provide drainage. PCCM notified. No new orders at this time. Will delegate to charge Nurse Jonelle Sidle.

## 2022-11-17 NOTE — Progress Notes (Addendum)
Consulted by case management to do PleurX education for Crystal [name] and her niece Crystal [name]. Will plan to do that today. I have left my cell phone number on the information board in the room to call me when Crystal [name] arrives so I can do education on draining.

## 2022-11-18 DIAGNOSIS — J90 Pleural effusion, not elsewhere classified: Secondary | ICD-10-CM | POA: Diagnosis not present

## 2022-11-18 DIAGNOSIS — C342 Malignant neoplasm of middle lobe, bronchus or lung: Secondary | ICD-10-CM | POA: Diagnosis not present

## 2022-11-18 DIAGNOSIS — I2694 Multiple subsegmental pulmonary emboli without acute cor pulmonale: Secondary | ICD-10-CM | POA: Diagnosis not present

## 2022-11-18 LAB — CBC
HCT: 35.3 % — ABNORMAL LOW (ref 36.0–46.0)
Hemoglobin: 11.2 g/dL — ABNORMAL LOW (ref 12.0–15.0)
MCH: 29.9 pg (ref 26.0–34.0)
MCHC: 31.7 g/dL (ref 30.0–36.0)
MCV: 94.1 fL (ref 80.0–100.0)
Platelets: 194 10*3/uL (ref 150–400)
RBC: 3.75 MIL/uL — ABNORMAL LOW (ref 3.87–5.11)
RDW: 15.1 % (ref 11.5–15.5)
WBC: 4.8 10*3/uL (ref 4.0–10.5)
nRBC: 0 % (ref 0.0–0.2)

## 2022-11-18 NOTE — TOC Transition Note (Addendum)
Transition of Care [facility]) - CM/SW Discharge Note   Patient Details  Name: Crystal [name] MRN: [id number] Date of Birth: [birth year]  Transition of Care [facility]) CM/SW Contact:  Carles Collet, RN Phone Number: 11/18/2022, 8:58 AM   Clinical Narrative:     Spoke w patient at bedside.  Reviewed DC paln, no HH available, niece has been taught care of PleurX drain, and she states she has another niece who is a nurse in the ED who will be able to assist as well. PleaurX drains for home are at the bedside.  RW to be delivered to the room to go home with patient.  PleurX order taken from chart, faxed, and also left for CMA to mail to [place] on Monday. Family to provide transport home.   Please make sure that patient goes home with meds from TOC, PleurX drains, and RW.   Patient has follow up appointments at [facility] on Tuesday Jan 30th starting at 9:30   10:12 drainage schedule for PleurX placed as a CM consult. I have added it the AVS so the patient has this information to go home with, and instructed the nurse to reprint it if necessary so the  patient is sent home with these instructions and to review the schedule with the patient as part of DC instructions.  "Pleural drainage to start 1/27. Drain daily, up to max of 1L until patient is only able to drain out 146ml. If < 158ml for 3 consecutive drains, then drain every other day. If < 128ml for 3 consecutive drains every other day, then call  [facility] [phone]) for possible removal. "  Final next level of care: Home/Self Care Barriers to Discharge: No Barriers Identified   Patient Goals and CMS Choice      Discharge Placement                         Discharge Plan and Services Additional resources added to the After Visit Summary for     Discharge Planning Services: CM Consult            DME Arranged: Gilford Rile rolling DME Agency: [facility] Date DME Agency Contacted: 11/18/22 Time DME Agency  Contacted: [phone] Representative spoke with at DME Agency: Brenton [facility]ills HH Arranged:  (unable to obtain [facility] services)          Social Determinants of Health (SDOH) Interventions SDOH Screenings   Food Insecurity: No Food Insecurity (11/12/2022)  Housing: Low Risk  (11/12/2022)  Transportation Needs: No Transportation Needs (11/12/2022)  Utilities: Not At Risk (11/12/2022)  Tobacco Use: Low Risk  (11/17/2022)     Readmission Risk Interventions     No data to display
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
## 2022-11-19 ENCOUNTER — Other Ambulatory Visit: Payer: Self-pay | Admitting: Oncology

## 2022-11-19 ENCOUNTER — Other Ambulatory Visit: Payer: Self-pay

## 2022-11-19 DIAGNOSIS — C3490 Malignant neoplasm of unspecified part of unspecified bronchus or lung: Secondary | ICD-10-CM

## 2022-11-19 DIAGNOSIS — C342 Malignant neoplasm of middle lobe, bronchus or lung: Secondary | ICD-10-CM

## 2022-11-21 ENCOUNTER — Other Ambulatory Visit: Payer: No Typology Code available for payment source

## 2022-11-21 ENCOUNTER — Inpatient Hospital Stay: Payer: No Typology Code available for payment source

## 2022-11-21 ENCOUNTER — Other Ambulatory Visit: Payer: Self-pay

## 2022-11-21 ENCOUNTER — Telehealth: Payer: Self-pay

## 2022-11-21 ENCOUNTER — Inpatient Hospital Stay: Payer: No Typology Code available for payment source | Admitting: Nurse Practitioner

## 2022-11-21 ENCOUNTER — Encounter: Payer: Self-pay | Admitting: Nurse Practitioner

## 2022-11-21 ENCOUNTER — Telehealth: Payer: Self-pay | Admitting: Pulmonary Disease

## 2022-11-21 ENCOUNTER — Ambulatory Visit: Payer: No Typology Code available for payment source | Admitting: Nurse Practitioner

## 2022-11-21 ENCOUNTER — Encounter: Payer: Self-pay | Admitting: Medical Oncology

## 2022-11-21 DIAGNOSIS — C7951 Secondary malignant neoplasm of bone: Secondary | ICD-10-CM | POA: Diagnosis not present

## 2022-11-21 DIAGNOSIS — C3490 Malignant neoplasm of unspecified part of unspecified bronchus or lung: Secondary | ICD-10-CM | POA: Diagnosis not present

## 2022-11-21 DIAGNOSIS — Z79620 Long term (current) use of immunosuppressive biologic: Secondary | ICD-10-CM | POA: Diagnosis not present

## 2022-11-21 DIAGNOSIS — C342 Malignant neoplasm of middle lobe, bronchus or lung: Secondary | ICD-10-CM

## 2022-11-21 DIAGNOSIS — J910 Malignant pleural effusion: Secondary | ICD-10-CM | POA: Diagnosis not present

## 2022-11-21 DIAGNOSIS — Z95828 Presence of other vascular implants and grafts: Secondary | ICD-10-CM

## 2022-11-21 LAB — CBC WITH DIFFERENTIAL (CANCER CENTER ONLY)
Abs Immature Granulocytes: 0.02 10*3/uL (ref 0.00–0.07)
Basophils Absolute: 0 10*3/uL (ref 0.0–0.1)
Basophils Relative: 1 %
Eosinophils Absolute: 0.1 10*3/uL (ref 0.0–0.5)
Eosinophils Relative: 3 %
HCT: 35.5 % — ABNORMAL LOW (ref 36.0–46.0)
Hemoglobin: 11.5 g/dL — ABNORMAL LOW (ref 12.0–15.0)
Immature Granulocytes: 0 %
Lymphocytes Relative: 21 %
Lymphs Abs: 1.1 10*3/uL (ref 0.7–4.0)
MCH: 29.9 pg (ref 26.0–34.0)
MCHC: 32.4 g/dL (ref 30.0–36.0)
MCV: 92.4 fL (ref 80.0–100.0)
Monocytes Absolute: 0.4 10*3/uL (ref 0.1–1.0)
Monocytes Relative: 8 %
Neutro Abs: 3.5 10*3/uL (ref 1.7–7.7)
Neutrophils Relative %: 67 %
Platelet Count: 304 10*3/uL (ref 150–400)
RBC: 3.84 MIL/uL — ABNORMAL LOW (ref 3.87–5.11)
RDW: 14.9 % (ref 11.5–15.5)
WBC Count: 5.2 10*3/uL (ref 4.0–10.5)
nRBC: 0 % (ref 0.0–0.2)

## 2022-11-21 LAB — CMP (CANCER CENTER ONLY)
ALT: 14 U/L (ref 0–44)
AST: 17 U/L (ref 15–41)
Albumin: 2.9 g/dL — ABNORMAL LOW (ref 3.5–5.0)
Alkaline Phosphatase: 54 U/L (ref 38–126)
Anion gap: 9 (ref 5–15)
BUN: 17 mg/dL (ref 8–23)
CO2: 26 mmol/L (ref 22–32)
Calcium: 8.3 mg/dL — ABNORMAL LOW (ref 8.9–10.3)
Chloride: 104 mmol/L (ref 98–111)
Creatinine: 1.4 mg/dL — ABNORMAL HIGH (ref 0.44–1.00)
GFR, Estimated: 39 mL/min — ABNORMAL LOW (ref >60)
Glucose, Bld: 108 mg/dL — ABNORMAL HIGH (ref 70–99)
Potassium: 3.3 mmol/L — ABNORMAL LOW (ref 3.5–5.1)
Sodium: 139 mmol/L (ref 135–145)
Total Bilirubin: 0.5 mg/dL (ref 0.3–1.2)
Total Protein: 5.9 g/dL — ABNORMAL LOW (ref 6.5–8.1)

## 2022-11-21 LAB — TSH: TSH: 3.952 u[IU]/mL (ref 0.350–4.500)

## 2022-11-21 MED ORDER — CYANOCOBALAMIN 1000 MCG/ML IJ SOLN
1000.0000 ug | Freq: Once | INTRAMUSCULAR | Status: AC
  Administered 2022-11-21: 1000 ug via INTRAMUSCULAR
  Filled 2022-11-21: qty 1

## 2022-11-21 MED ORDER — SODIUM CHLORIDE 0.9% FLUSH
10.0000 mL | Freq: Once | INTRAVENOUS | Status: AC
  Administered 2022-11-21: 10 mL via INTRAVENOUS

## 2022-11-21 MED ORDER — HEPARIN SOD (PORK) LOCK FLUSH 100 UNIT/ML IV SOLN
500.0000 [IU] | Freq: Once | INTRAVENOUS | Status: AC
  Administered 2022-11-21: 500 [IU] via INTRAVENOUS

## 2022-11-21 NOTE — Progress Notes (Signed)
  Trial:  [employer] 2021-05 - Specimen Collection Study to Evaluate Biomarkers in Subjects with Cancer   Patient Crystal [name] was identified by MD as a potential candidate for the above listed study.  This Clinical Research Nurse met with [name], [id number], on 11/21/22 in a manner and location that ensures patient privacy to discuss participation in the above listed research study.  Patient is Accompanied by her sister today .  A copy of the informed consent document with embedded HIPAA language was provided to the patient.  Patient reads, speaks, and understands [place].   Patient was provided with the business card of this Nurse and encouraged to contact the research team with any questions.  Approximately 10 minutes were spent with the patient reviewing the informed consent documents.  Patient was provided the option of taking informed consent documents home to review and was encouraged to review at their convenience with their support network, including other care providers. Patient took the consent documents home to review. Patient is not scheduled to start treatment until next week. Patient was informed that blood collection would need to occur prior to the start of any treatment. I informed patient that I will follow-up with her, within a week, to see if she has any questions and if she is interested in study participation. Patient denies any questions at this time. Patient thanked for her time and encouraged to call with questions.    Maxwell Marion, RN, BSN, [facility] Clinical Research Nurse Lead 11/21/2022 11:31 AM

## 2022-11-21 NOTE — Telephone Encounter (Signed)
PT PCP calling for appt. PT just release from hospital and a [place] was inserted. She was give no exit [place], they said and they wonder how often they need to drain it. Pls call PT @ [phone]

## 2022-11-21 NOTE — Patient Instructions (Signed)

## 2022-11-21 NOTE — Patient Instructions (Signed)
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
## 2022-11-21 NOTE — Telephone Encounter (Signed)
-----  Message from Owens Shark, NP sent at 11/21/2022  3:54 PM EST ----- Please let Ms. Raggio know the estimated result date for the foundation 1 testing is 12/04/2022.  Does she want to wait for this result or begin treatment as she had previously discussed with Dr. Benay Spice?

## 2022-11-21 NOTE — Telephone Encounter (Addendum)
Mrs. Hatcher called in and stated her niece drainage about 350cc of fluids from her pleural catheter. She agree to wait for the foundation 1 testing result to begin treatment.

## 2022-11-22 ENCOUNTER — Inpatient Hospital Stay: Payer: No Typology Code available for payment source

## 2022-11-23 ENCOUNTER — Encounter: Payer: Self-pay | Admitting: Oncology

## 2022-11-23 ENCOUNTER — Other Ambulatory Visit: Payer: Self-pay | Admitting: Oncology

## 2022-11-23 DIAGNOSIS — C8599 Non-Hodgkin lymphoma, unspecified, extranodal and solid organ sites: Secondary | ICD-10-CM

## 2022-11-23 LAB — T4: T4, Total: 6.6 ug/dL (ref 4.5–12.0)

## 2022-11-23 MED ORDER — OXYCODONE HCL 5 MG PO TABS: 5.0000 mg | ORAL_TABLET | Freq: Four times a day (QID) | ORAL | 0 refills | Status: DC | PRN

## 2022-11-23 NOTE — Telephone Encounter (Signed)
Spoke with pt and reviewed D/C paperwork from most recent hospital visit that states pt should keep pulmonary OV as scheduled. Pt is scheduled for hospital f/u on 11/30/22 at 11 with Geraldo Pitter. Pt instructed if she needed [place] before then to call office. Pt stated understanding. Nothing further needed at this time.

## 2022-11-24 ENCOUNTER — Telehealth: Payer: Self-pay | Admitting: *Deleted

## 2022-11-24 NOTE — Telephone Encounter (Signed)
Noted MyChart message from her Phill Mutter and called Ms. Markus. She declines home health nurse at this time saying she has three people who can drain her Jinny Blossom, April and Angie). Informed her that [facility] results show ETA of 12/01/22 according to [facility] website. She reports cath is drained daily and she only has #6 kits at this time. Reports her cost on 1 box of kits is $180 out of pocket. This RN called [facility] and confirmed they have her orders and have been trying to reach her to arrange delivery. Made her HCPOA aware of the above.

## 2022-11-27 ENCOUNTER — Other Ambulatory Visit: Payer: Self-pay | Admitting: *Deleted

## 2022-11-27 ENCOUNTER — Encounter: Payer: Self-pay | Admitting: *Deleted

## 2022-11-27 ENCOUNTER — Telehealth: Payer: Self-pay | Admitting: Medical Oncology

## 2022-11-27 DIAGNOSIS — C342 Malignant neoplasm of middle lobe, bronchus or lung: Secondary | ICD-10-CM | POA: Diagnosis not present

## 2022-11-27 MED ORDER — PROCHLORPERAZINE MALEATE 10 MG PO TABS: 10.0000 mg | ORAL_TABLET | Freq: Four times a day (QID) | ORAL | 1 refills | Status: DC | PRN

## 2022-11-27 MED ORDER — ONDANSETRON HCL 8 MG PO TABS: 8.0000 mg | ORAL_TABLET | Freq: Three times a day (TID) | ORAL | 0 refills | Status: DC | PRN

## 2022-11-27 MED ORDER — LIDOCAINE-PRILOCAINE 2.5-2.5 % EX CREA: 1.0000 | TOPICAL_CREAM | CUTANEOUS | 0 refills | Status: DC | PRN

## 2022-11-27 NOTE — Progress Notes (Signed)
PATIENT NAVIGATOR PROGRESS NOTE  Name: Crystal [name] Date: 11/27/2022 MRN: [id number]  DOB: September 27, 1946   Reason for visit:  Molecular study results and starting treatment  Comments:  Called pt per Dr Benay Spice to discuss Foundation one results and that the molecular study did not find any actionable mutations. Mr Tellez would like to start treatment immediately. After coordination with team we have her scheduled  to start treatment on 11/29/22.      Time spent counseling/coordinating care: 45-60 minutes

## 2022-11-27 NOTE — Telephone Encounter (Signed)
[employer] 2021-05 - Specimen Collection Study to Evaluate Biomarkers in Subjects with Cancer   Outside call:  [phone]  Follow up call to patient. Inquired with patient if she had some time to read the study information provided to her last week and if she had any questions. Patient stated that her treatment will be starting on Wednesday and that she wanted more time to decided and she would decide on Wednesday. Patient thanked. Encouraged to call with questions.   Maxwell Marion, RN, BSN, [place] Clinical Research Nurse Lead 11/27/2022 4:26 PM

## 2022-11-28 ENCOUNTER — Inpatient Hospital Stay: Payer: No Typology Code available for payment source

## 2022-11-28 ENCOUNTER — Ambulatory Visit: Payer: No Typology Code available for payment source

## 2022-11-28 ENCOUNTER — Inpatient Hospital Stay: Payer: No Typology Code available for payment source | Admitting: Nurse Practitioner

## 2022-11-29 ENCOUNTER — Encounter: Payer: Self-pay | Admitting: Medical Oncology

## 2022-11-29 ENCOUNTER — Inpatient Hospital Stay: Payer: No Typology Code available for payment source

## 2022-11-29 ENCOUNTER — Inpatient Hospital Stay: Payer: No Typology Code available for payment source | Admitting: Nurse Practitioner

## 2022-11-29 ENCOUNTER — Inpatient Hospital Stay: Payer: No Typology Code available for payment source | Attending: Oncology

## 2022-11-29 ENCOUNTER — Encounter: Payer: Self-pay | Admitting: Nurse Practitioner

## 2022-11-29 VITALS — BP 124/70 | HR 80

## 2022-11-29 VITALS — BP 126/90 | HR 96 | Temp 98.1°F | Resp 18 | Ht 64.0 in | Wt 210.0 lb

## 2022-11-29 DIAGNOSIS — Z5112 Encounter for antineoplastic immunotherapy: Secondary | ICD-10-CM | POA: Diagnosis not present

## 2022-11-29 DIAGNOSIS — K1379 Other lesions of oral mucosa: Secondary | ICD-10-CM | POA: Diagnosis not present

## 2022-11-29 DIAGNOSIS — C342 Malignant neoplasm of middle lobe, bronchus or lung: Secondary | ICD-10-CM

## 2022-11-29 DIAGNOSIS — Z79620 Long term (current) use of immunosuppressive biologic: Secondary | ICD-10-CM | POA: Insufficient documentation

## 2022-11-29 DIAGNOSIS — C3490 Malignant neoplasm of unspecified part of unspecified bronchus or lung: Secondary | ICD-10-CM

## 2022-11-29 DIAGNOSIS — Z5111 Encounter for antineoplastic chemotherapy: Secondary | ICD-10-CM | POA: Insufficient documentation

## 2022-11-29 DIAGNOSIS — C7951 Secondary malignant neoplasm of bone: Secondary | ICD-10-CM | POA: Insufficient documentation

## 2022-11-29 DIAGNOSIS — R197 Diarrhea, unspecified: Secondary | ICD-10-CM | POA: Insufficient documentation

## 2022-11-29 LAB — CBC WITH DIFFERENTIAL (CANCER CENTER ONLY)
Abs Immature Granulocytes: 0.02 10*3/uL (ref 0.00–0.07)
Basophils Absolute: 0.1 10*3/uL (ref 0.0–0.1)
Basophils Relative: 1 %
Eosinophils Absolute: 0.1 10*3/uL (ref 0.0–0.5)
Eosinophils Relative: 1 %
HCT: 37.6 % (ref 36.0–46.0)
Hemoglobin: 11.9 g/dL — ABNORMAL LOW (ref 12.0–15.0)
Immature Granulocytes: 0 %
Lymphocytes Relative: 20 %
Lymphs Abs: 1 10*3/uL (ref 0.7–4.0)
MCH: 29.5 pg (ref 26.0–34.0)
MCHC: 31.6 g/dL (ref 30.0–36.0)
MCV: 93.1 fL (ref 80.0–100.0)
Monocytes Absolute: 0.4 10*3/uL (ref 0.1–1.0)
Monocytes Relative: 7 %
Neutro Abs: 3.6 10*3/uL (ref 1.7–7.7)
Neutrophils Relative %: 71 %
Platelet Count: 286 10*3/uL (ref 150–400)
RBC: 4.04 MIL/uL (ref 3.87–5.11)
RDW: 15.2 % (ref 11.5–15.5)
WBC Count: 5.1 10*3/uL (ref 4.0–10.5)
nRBC: 0 % (ref 0.0–0.2)

## 2022-11-29 LAB — CMP (CANCER CENTER ONLY)
ALT: 8 U/L (ref 0–44)
AST: 13 U/L — ABNORMAL LOW (ref 15–41)
Albumin: 2.9 g/dL — ABNORMAL LOW (ref 3.5–5.0)
Alkaline Phosphatase: 52 U/L (ref 38–126)
Anion gap: 8 (ref 5–15)
BUN: 14 mg/dL (ref 8–23)
CO2: 27 mmol/L (ref 22–32)
Calcium: 8.5 mg/dL — ABNORMAL LOW (ref 8.9–10.3)
Chloride: 107 mmol/L (ref 98–111)
Creatinine: 1.34 mg/dL — ABNORMAL HIGH (ref 0.44–1.00)
GFR, Estimated: 41 mL/min — ABNORMAL LOW (ref >60)
Glucose, Bld: 109 mg/dL — ABNORMAL HIGH (ref 70–99)
Potassium: 3.5 mmol/L (ref 3.5–5.1)
Sodium: 142 mmol/L (ref 135–145)
Total Bilirubin: 0.4 mg/dL (ref 0.3–1.2)
Total Protein: 6.2 g/dL — ABNORMAL LOW (ref 6.5–8.1)

## 2022-11-29 LAB — TSH: TSH: 3.794 u[IU]/mL (ref 0.350–4.500)

## 2022-11-29 MED ORDER — SODIUM CHLORIDE 0.9 % IV SOLN: Freq: Once | INTRAVENOUS | Status: AC

## 2022-11-29 MED ORDER — SODIUM CHLORIDE 0.9 % IV SOLN
150.0000 mg | Freq: Once | INTRAVENOUS | Status: AC
  Administered 2022-11-29: 150 mg via INTRAVENOUS
  Filled 2022-11-29: qty 5

## 2022-11-29 MED ORDER — SODIUM CHLORIDE 0.9 % IV SOLN
500.0000 mg/m2 | Freq: Once | INTRAVENOUS | Status: AC
  Administered 2022-11-29: 1000 mg via INTRAVENOUS
  Filled 2022-11-29: qty 40

## 2022-11-29 MED ORDER — SODIUM CHLORIDE 0.9 % IV SOLN
394.5000 mg | Freq: Once | INTRAVENOUS | Status: AC
  Administered 2022-11-29: 400 mg via INTRAVENOUS
  Filled 2022-11-29: qty 40

## 2022-11-29 MED ORDER — HEPARIN SOD (PORK) LOCK FLUSH 100 UNIT/ML IV SOLN
500.0000 [IU] | Freq: Once | INTRAVENOUS | Status: AC | PRN
  Administered 2022-11-29: 500 [IU]

## 2022-11-29 MED ORDER — SODIUM CHLORIDE 0.9% FLUSH
10.0000 mL | INTRAVENOUS | Status: DC | PRN
  Administered 2022-11-29: 10 mL

## 2022-11-29 MED ORDER — FOLIC ACID 1 MG PO TABS: 1.0000 mg | ORAL_TABLET | Freq: Every day | ORAL | 5 refills | Status: DC

## 2022-11-29 MED ORDER — PALONOSETRON HCL INJECTION 0.25 MG/5ML
0.2500 mg | Freq: Once | INTRAVENOUS | Status: AC
  Administered 2022-11-29: 0.25 mg via INTRAVENOUS
  Filled 2022-11-29: qty 5

## 2022-11-29 MED ORDER — SODIUM CHLORIDE 0.9 % IV SOLN
200.0000 mg | Freq: Once | INTRAVENOUS | Status: AC
  Administered 2022-11-29: 200 mg via INTRAVENOUS
  Filled 2022-11-29: qty 8

## 2022-11-29 MED ORDER — SODIUM CHLORIDE 0.9 % IV SOLN
10.0000 mg | Freq: Once | INTRAVENOUS | Status: AC
  Administered 2022-11-29: 10 mg via INTRAVENOUS
  Filled 2022-11-29: qty 1

## 2022-11-29 NOTE — Progress Notes (Signed)
[employer] 2021-05 - Specimen Collection Study to Evaluate Biomarkers in Subjects with Cancer    Went to meet with patient this morning during her scheduled clinic appointments. Patient scheduled with NP, Ned Card. NP informed patient that I was in clinic to follow up with patient, as patient requested yesterday during our phone conversation. Patient informed NP that she was not interested in participating with study. Patient did not provide a reason to declining. Patient thanked.  Maxwell Marion, RN, BSN, [place] Clinical Research Nurse Lead 11/29/2022 9:57 AM

## 2022-11-29 NOTE — Patient Instructions (Signed)
[facility]   Discharge Instructions: Thank you for choosing [facility] to provide your oncology and hematology care.   If you have a lab appointment with the [facility], please go directly to the [facility] and check in at the registration area.   Wear comfortable clothing and clothing appropriate for easy access to any Portacath or PICC line.   We strive to give you quality time with your provider. You may need to reschedule your appointment if you arrive late (15 or more minutes).  Arriving late affects you and other patients whose appointments are after yours.  Also, if you miss three or more appointments without notifying the office, you may be dismissed from the clinic at the provider's discretion.      For prescription refill requests, have your pharmacy contact our office and allow 72 hours for refills to be completed.    Today you received the following chemotherapy and/or immunotherapy agents Keytruda, Alimta, Carboplatin.      To help prevent nausea and vomiting after your treatment, we encourage you to take your nausea medication as directed.  BELOW ARE SYMPTOMS THAT SHOULD BE REPORTED IMMEDIATELY: *FEVER GREATER THAN 100.4 F (38 C) OR HIGHER *CHILLS OR SWEATING *NAUSEA AND VOMITING THAT IS NOT CONTROLLED WITH YOUR NAUSEA MEDICATION *UNUSUAL SHORTNESS OF BREATH *UNUSUAL BRUISING OR BLEEDING *URINARY PROBLEMS (pain or burning when urinating, or frequent urination) *BOWEL PROBLEMS (unusual diarrhea, constipation, pain near the anus) TENDERNESS IN MOUTH AND THROAT WITH OR WITHOUT PRESENCE OF ULCERS (sore throat, sores in mouth, or a toothache) UNUSUAL RASH, SWELLING OR PAIN  UNUSUAL VAGINAL DISCHARGE OR ITCHING   Items with * indicate a potential emergency and should be followed up as soon as possible or go to the Emergency Department if any problems should occur.  Please show the CHEMOTHERAPY ALERT CARD or IMMUNOTHERAPY  ALERT CARD at check-in to the Emergency Department and triage nurse.  Should you have questions after your visit or need to cancel or reschedule your appointment, please contact [facility]  Dept: [phone]  and follow the prompts.  Office hours are 8:00 a.m. to 4:30 p.m. Monday - Friday. Please note that voicemails left after 4:00 p.m. may not be returned until the following business day.  We are closed weekends and major holidays. You have access to a nurse at all times for urgent questions. Please call the main number to the clinic Dept: [phone] and follow the prompts.   For any non-urgent questions, you may also contact your provider using MyChart. We now offer e-Visits for anyone 83 and older to request care online for non-urgent symptoms. For details visit [email or web address].   Also download the MyChart app! Go to the app store, search "MyChart", open the app, select , and log in with your MyChart username and password.  Pembrolizumab Injection What is this medication? PEMBROLIZUMAB (PEM broe LIZ ue mab) treats some types of cancer. It works by helping your immune system slow or stop the spread of cancer cells. It is a monoclonal antibody. This medicine may be used for other purposes; ask your health care provider or pharmacist if you have questions. COMMON BRAND NAME(S): Keytruda What should I tell my care team before I take this medication? They need to know if you have any of these conditions: Allogeneic stem cell transplant (uses someone else's stem cells) Autoimmune diseases, such as Crohn disease, ulcerative colitis, lupus History of chest radiation  Nervous system problems, such as Guillain-Barre syndrome, myasthenia gravis Organ transplant An unusual or allergic reaction to pembrolizumab, other medications, foods, dyes, or preservatives Pregnant or trying to get pregnant Breast-feeding How should I use this  medication? This medication is injected into a vein. It is given by your care team in a hospital or clinic setting. A special MedGuide will be given to you before each treatment. Be sure to read this information carefully each time. Talk to your care team about the use of this medication in children. While it may be prescribed for children as young as 6 months for selected conditions, precautions do apply. Overdosage: If you think you have taken too much of this medicine contact a poison control center or emergency room at once. NOTE: This medicine is only for you. Do not share this medicine with others. What if I miss a dose? Keep appointments for follow-up doses. It is important not to miss your dose. Call your care team if you are unable to keep an appointment. What may interact with this medication? Interactions have not been studied. This list may not describe all possible interactions. Give your health care provider a list of all the medicines, herbs, non-prescription drugs, or dietary supplements you use. Also tell them if you smoke, drink alcohol, or use illegal drugs. Some items may interact with your medicine. What should I watch for while using this medication? Your condition will be monitored carefully while you are receiving this medication. You may need blood work while taking this medication. This medication may cause serious skin reactions. They can happen weeks to months after starting the medication. Contact your care team right away if you notice fevers or flu-like symptoms with a rash. The rash may be red or purple and then turn into blisters or peeling of the skin. You may also notice a red rash with swelling of the face, lips, or lymph nodes in your neck or under your arms. Tell your care team right away if you have any change in your eyesight. Talk to your care team if you may be pregnant. Serious birth defects can occur if you take this medication during pregnancy and for 4  months after the last dose. You will need a negative pregnancy test before starting this medication. Contraception is recommended while taking this medication and for 4 months after the last dose. Your care team can help you find the option that works for you. Do not breastfeed while taking this medication and for 4 months after the last dose. What side effects may I notice from receiving this medication? Side effects that you should report to your care team as soon as possible: Allergic reactions--skin rash, itching, hives, swelling of the face, lips, tongue, or throat Dry cough, shortness of breath or trouble breathing Eye pain, redness, irritation, or discharge with blurry or decreased vision Heart muscle inflammation--unusual weakness or fatigue, shortness of breath, chest pain, fast or irregular heartbeat, dizziness, swelling of the ankles, feet, or hands Hormone gland problems--headache, sensitivity to light, unusual weakness or fatigue, dizziness, fast or irregular heartbeat, increased sensitivity to cold or heat, excessive sweating, constipation, hair loss, increased thirst or amount of urine, tremors or shaking, irritability Infusion reactions--chest pain, shortness of breath or trouble breathing, feeling faint or lightheaded Kidney injury (glomerulonephritis)--decrease in the amount of urine, red or dark brown urine, foamy or bubbly urine, swelling of the ankles, hands, or feet Liver injury--right upper belly pain, loss of appetite, nausea, light-colored stool, dark yellow  or brown urine, yellowing skin or eyes, unusual weakness or fatigue Pain, tingling, or numbness in the hands or feet, muscle weakness, change in vision, confusion or trouble speaking, loss of balance or coordination, trouble walking, seizures Rash, fever, and swollen lymph nodes Redness, blistering, peeling, or loosening of the skin, including inside the mouth Sudden or severe stomach pain, bloody diarrhea, fever, nausea,  vomiting Side effects that usually do not require medical attention (report to your care team if they continue or are bothersome): Bone, joint, or muscle pain Diarrhea Fatigue Loss of appetite Nausea Skin rash This list may not describe all possible side effects. Call your doctor for medical advice about side effects. You may report side effects to FDA at 1-800-FDA-1088. Where should I keep my medication? This medication is given in a hospital or clinic. It will not be stored at home. NOTE: This sheet is a summary. It may not cover all possible information. If you have questions about this medicine, talk to your doctor, pharmacist, or health care provider.  2023 Elsevier/Gold Standard (2013-06-30 00:00:00)  Pemetrexed Injection What is this medication? PEMETREXED (PEM e TREX ed) treats some types of cancer. It works by slowing down the growth of cancer cells. This medicine may be used for other purposes; ask your health care provider or pharmacist if you have questions. COMMON BRAND NAME(S): Alimta, PEMFEXY What should I tell my care team before I take this medication? They need to know if you have any of these conditions: Infection, such as chickenpox, cold sores, or herpes Kidney disease Low blood cell levels (white cells, red cells, and platelets) Lung or breathing disease, such as asthma Radiation therapy An unusual or allergic reaction to pemetrexed, other medications, foods, dyes, or preservatives If you or your partner are pregnant or trying to get pregnant Breast-feeding How should I use this medication? This medication is injected into a vein. It is given by your care team in a hospital or clinic setting. Talk to your care team about the use of this medication in children. Special care may be needed. Overdosage: If you think you have taken too much of this medicine contact a poison control center or emergency room at once. NOTE: This medicine is only for you. Do not share  this medicine with others. What if I miss a dose? Keep appointments for follow-up doses. It is important not to miss your dose. Call your care team if you are unable to keep an appointment. What may interact with this medication? Do not take this medication with any of the following: Live virus vaccines This medication may also interact with the following: Ibuprofen This list may not describe all possible interactions. Give your health care provider a list of all the medicines, herbs, non-prescription drugs, or dietary supplements you use. Also tell them if you smoke, drink alcohol, or use illegal drugs. Some items may interact with your medicine. What should I watch for while using this medication? Your condition will be monitored carefully while you are receiving this medication. This medication may make you feel generally unwell. This is not uncommon as chemotherapy can affect healthy cells as well as cancer cells. Report any side effects. Continue your course of treatment even though you feel ill unless your care team tells you to stop. This medication can cause serious side effects. To reduce the risk, your care team may give you other medications to take before receiving this one. Be sure to follow the directions from your care team. This  medication can cause a rash or redness in areas of the body that have previously had radiation therapy. If you have had radiation therapy, tell your care team if you notice a rash in this area. This medication may increase your risk of getting an infection. Call your care team for advice if you get a fever, chills, sore throat, or other symptoms of a cold or flu. Do not treat yourself. Try to avoid being around people who are sick. Be careful brushing or flossing your teeth or using a toothpick because you may get an infection or bleed more easily. If you have any dental work done, tell your dentist you are receiving this medication. Avoid taking medications  that contain aspirin, acetaminophen, ibuprofen, naproxen, or ketoprofen unless instructed by your care team. These medications may hide a fever. Check with your care team if you have severe diarrhea, nausea, and vomiting, or if you sweat a lot. The loss of too much body fluid may make it dangerous for you to take this medication. Talk to your care team if you or your partner wish to become pregnant or think either of you might be pregnant. This medication can cause serious birth defects if taken during pregnancy and for 6 months after the last dose. A negative pregnancy test is required before starting this medication. A reliable form of contraception is recommended while taking this medication and for 6 months after the last dose. Talk to your care team about reliable forms of contraception. Do not father a child while taking this medication and for 3 months after the last dose. Use a condom while having sex during this time period. Do not breastfeed while taking this medication and for 1 week after the last dose. This medication may cause infertility. Talk to your care team if you are concerned about your fertility. What side effects may I notice from receiving this medication? Side effects that you should report to your care team as soon as possible: Allergic reactions--skin rash, itching, hives, swelling of the face, lips, tongue, or throat Dry cough, shortness of breath or trouble breathing Infection--fever, chills, cough, sore throat, wounds that don't heal, pain or trouble when passing urine, general feeling of discomfort or being unwell Kidney injury--decrease in the amount of urine, swelling of the ankles, hands, or feet Low red blood cell level--unusual weakness or fatigue, dizziness, headache, trouble breathing Redness, blistering, peeling, or loosening of the skin, including inside the mouth Unusual bruising or bleeding Side effects that usually do not require medical attention (report to  your care team if they continue or are bothersome): Fatigue Loss of appetite Nausea Vomiting This list may not describe all possible side effects. Call your doctor for medical advice about side effects. You may report side effects to FDA at 1-800-FDA-1088. Where should I keep my medication? This medication is given in a hospital or clinic. It will not be stored at home. NOTE: This sheet is a summary. It may not cover all possible information. If you have questions about this medicine, talk to your doctor, pharmacist, or health care provider.  2023 Elsevier/Gold Standard (2022-02-13 00:00:00)  Carboplatin Injection What is this medication? CARBOPLATIN (KAR boe pla tin) treats some types of cancer. It works by slowing down the growth of cancer cells. This medicine may be used for other purposes; ask your health care provider or pharmacist if you have questions. COMMON BRAND NAME(S): Paraplatin What should I tell my care team before I take this medication? They need  to know if you have any of these conditions: Blood disorders Hearing problems Kidney disease Recent or ongoing radiation therapy An unusual or allergic reaction to carboplatin, cisplatin, other medications, foods, dyes, or preservatives Pregnant or trying to get pregnant Breast-feeding How should I use this medication? This medication is injected into a vein. It is given by your care team in a hospital or clinic setting. Talk to your care team about the use of this medication in children. Special care may be needed. Overdosage: If you think you have taken too much of this medicine contact a poison control center or emergency room at once. NOTE: This medicine is only for you. Do not share this medicine with others. What if I miss a dose? Keep appointments for follow-up doses. It is important not to miss your dose. Call your care team if you are unable to keep an appointment. What may interact with this  medication? Medications for seizures Some antibiotics, such as amikacin, gentamicin, neomycin, streptomycin, tobramycin Vaccines This list may not describe all possible interactions. Give your health care provider a list of all the medicines, herbs, non-prescription drugs, or dietary supplements you use. Also tell them if you smoke, drink alcohol, or use illegal drugs. Some items may interact with your medicine. What should I watch for while using this medication? Your condition will be monitored carefully while you are receiving this medication. You may need blood work while taking this medication. This medication may make you feel generally unwell. This is not uncommon, as chemotherapy can affect healthy cells as well as cancer cells. Report any side effects. Continue your course of treatment even though you feel ill unless your care team tells you to stop. In some cases, you may be given additional medications to help with side effects. Follow all directions for their use. This medication may increase your risk of getting an infection. Call your care team for advice if you get a fever, chills, sore throat, or other symptoms of a cold or flu. Do not treat yourself. Try to avoid being around people who are sick. Avoid taking medications that contain aspirin, acetaminophen, ibuprofen, naproxen, or ketoprofen unless instructed by your care team. These medications may hide a fever. Be careful brushing or flossing your teeth or using a toothpick because you may get an infection or bleed more easily. If you have any dental work done, tell your dentist you are receiving this medication. Talk to your care team if you wish to become pregnant or think you might be pregnant. This medication can cause serious birth defects. Talk to your care team about effective forms of contraception. Do not breast-feed while taking this medication. What side effects may I notice from receiving this medication? Side effects  that you should report to your care team as soon as possible: Allergic reactions--skin rash, itching, hives, swelling of the face, lips, tongue, or throat Infection--fever, chills, cough, sore throat, wounds that don't heal, pain or trouble when passing urine, general feeling of discomfort or being unwell Low red blood cell level--unusual weakness or fatigue, dizziness, headache, trouble breathing Pain, tingling, or numbness in the hands or feet, muscle weakness, change in vision, confusion or trouble speaking, loss of balance or coordination, trouble walking, seizures Unusual bruising or bleeding Side effects that usually do not require medical attention (report to your care team if they continue or are bothersome): Hair loss Nausea Unusual weakness or fatigue Vomiting This list may not describe all possible side effects. Call your doctor  for medical advice about side effects. You may report side effects to FDA at 1-800-FDA-1088. Where should I keep my medication? This medication is given in a hospital or clinic. It will not be stored at home. NOTE: This sheet is a summary. It may not cover all possible information. If you have questions about this medicine, talk to your doctor, pharmacist, or health care provider.  2023 Elsevier/Gold Standard (2022-01-23 00:00:00)
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
## 2022-11-29 NOTE — Progress Notes (Signed)
Patient seen by Lisa Thomas NP today  Vitals are within treatment parameters.  Labs reviewed by Lisa Thomas NP and are within treatment parameters.  Per physician team, patient is ready for treatment and there are NO modifications to the treatment plan.     

## 2022-11-29 NOTE — Progress Notes (Unsigned)
@Patient  ID: Crystal [name], female    DOB: December 08, 1945, 77 y.o.   MRN: [id number]  No chief complaint on file.   Referring provider: Gaynelle Arabian, MD  HPI: 77 year old female, never smoked.  Past medical history significant for pretension, nonischemic cardiomyopathy, acute pulmonary embolism, chronic heart failure with reduced ejection fraction, metastatic non-small cell lung cancer, malignant pleural effusion, acute hypoxic respiratory failure, chronic kidney disease stage III.  11/30/2022 Patient presents today for hospital follow-up.  History significant for subsegmental pulmonary emboli, recurrent right pleural effusion, cancer right middle lobe of lung.   Pleural catheter inserted on 11/16/2022 by Dr. Erin Fulling         Allergies  Allergen Reactions   Simvastatin     Leg cramps   Zoloft [Sertraline] Nausea Only   Codeine Other (See Comments)    Bad Headaches   Coreg Other (See Comments)    "Made my legs hurt"   Lisinopril Cough   Tizanidine Hcl Rash    hypotension    Immunization History  Administered Date(s) Administered   Influenza Split 08/15/2010, 07/10/2011, 07/18/2012, 08/13/2013, 07/12/2015, 07/17/2016, 07/13/2018   Influenza, High Dose Seasonal PF 07/15/2018, 08/13/2019   Influenza,inj,Quad PF,6+ Mos 06/30/2014, 07/17/2016   Influenza-Unspecified 07/24/2019   Pneumococcal Conjugate-13 09/08/2011, 09/10/2014   Pneumococcal Polysaccharide-23 09/05/2011, 05/11/2015    Past Medical History:  Diagnosis Date   Anxiety    Atherosclerosis of aorta ([place])    Breast cancer ([place]) 1998   (Rt) lumpectomy dx 1999; Dr. Benay Spice   Dyslipidemia, goal LDL below 130    Essential hypertension    GERD (gastroesophageal reflux disease)    Hypercholesterolemia    IBS (irritable bowel syndrome)    Lymphoma ([place])    lymphoma dx 11/28/10 - left neck   non hodgkins lymphoma 12/2010   right aprotid gland   Nonischemic cardiomyopathy ([place]) 2008   ? Doxorubincin induced;  essentially resolved as of echo in January 2014, current EF 50-55%. Grade 1 diastolic dysfunction.   Obesity    Severe obesity (BMI >= 40) ([place]) 05/21/2013   Improve to BMI of 39 by July 2015   Tremor     Tobacco History: Social History   Tobacco Use  Smoking Status Never  Smokeless Tobacco Never   Counseling given: Not Answered   Outpatient Medications Prior to Visit  Medication Sig Dispense Refill   acetaminophen (TYLENOL) 325 MG tablet Take 2 tablets (650 mg total) by mouth every 6 (six) hours as needed for mild pain or moderate pain.     APIXABAN (ELIQUIS) VTE STARTER PACK (10MG  AND 5MG ) Take as directed on package: Take 2 tablets (10 mg total) twice daily for 7 days THEN take 1 tablet (5 mg total) twice daily thereafter. 74 each 0   folic acid (FOLVITE) 1 MG tablet Take 1 tablet (1 mg total) by mouth daily. 30 tablet 5   lidocaine-prilocaine (EMLA) cream Apply 1 Application topically as needed (Apply to port site an hour before port to be accessed). (Patient not taking: Reported on 11/29/2022) 30 g 0   LORazepam (ATIVAN) 0.5 MG tablet Take 1 tablet (0.5 mg total) by mouth 2 (two) times daily as needed. 60 tablet 0   metoprolol tartrate (LOPRESSOR) 100 MG tablet Take 1 tablet (100 mg total) by mouth 2 (two) times daily. 60 tablet 0   ondansetron (ZOFRAN) 8 MG tablet Take 1 tablet (8 mg total) by mouth every 8 (eight) hours as needed for nausea or vomiting (starting using day 3 after  treatment as needed for nausea/vomiting). (Patient not taking: Reported on 11/29/2022) 20 tablet 0   oxyCODONE (OXY IR/ROXICODONE) 5 MG immediate release tablet Take 1 tablet (5 mg total) by mouth every 6 (six) hours as needed for severe pain. 60 tablet 0   prochlorperazine (COMPAZINE) 10 MG tablet Take 1 tablet (10 mg total) by mouth every 6 (six) hours as needed for nausea or vomiting. (Patient not taking: Reported on 11/29/2022) 30 tablet 1   Facility-Administered Medications Prior to Visit  Medication Dose  Route Frequency Provider Last Rate Last Admin   sodium chloride flush (NS) 0.9 % injection 10 mL  10 mL Intracatheter PRN Ladell Pier, MD   10 mL at 11/29/22 1308      Review of Systems  Review of Systems   Physical Exam  There were no vitals taken for this visit. Physical Exam   Lab Results:  CBC    Component Value Date/Time   WBC 5.1 11/29/2022 0807   WBC 4.8 11/18/2022 0422   RBC 4.04 11/29/2022 0807   HGB 11.9 (L) 11/29/2022 0807   HGB 13.5 09/04/2022 0906   HGB 13.5 06/30/2014 1417   HCT 37.6 11/29/2022 0807   HCT 40.8 09/04/2022 0906   HCT 40.3 06/30/2014 1417   PLT 286 11/29/2022 0807   PLT 250 09/04/2022 0906   MCV 93.1 11/29/2022 0807   MCV 91 09/04/2022 0906   MCV 90.6 06/30/2014 1417   MCH 29.5 11/29/2022 0807   MCHC 31.6 11/29/2022 0807   RDW 15.2 11/29/2022 0807   RDW 13.4 09/04/2022 0906   RDW 14.2 06/30/2014 1417   LYMPHSABS 1.0 11/29/2022 0807   LYMPHSABS 1.2 06/30/2014 1417   MONOABS 0.4 11/29/2022 0807   MONOABS 0.3 06/30/2014 1417   EOSABS 0.1 11/29/2022 0807   EOSABS 0.0 06/30/2014 1417   BASOSABS 0.1 11/29/2022 0807   BASOSABS 0.0 06/30/2014 1417    BMET    Component Value Date/Time   NA 142 11/29/2022 0807   NA 143 09/04/2022 0906   NA 147 (H) 01/27/2014 0923   K 3.5 11/29/2022 0807   K 4.1 01/27/2014 0923   CL 107 11/29/2022 0807   CO2 27 11/29/2022 0807   CO2 27 01/27/2014 0923   GLUCOSE 109 (H) 11/29/2022 0807   GLUCOSE 132 01/27/2014 0923   BUN 14 11/29/2022 0807   BUN 24 09/04/2022 0906   BUN 18.4 01/27/2014 0923   CREATININE 1.34 (H) 11/29/2022 0807   CREATININE 1.07 12/08/2014 0817   CREATININE 1.0 01/27/2014 0923   CALCIUM 8.5 (L) 11/29/2022 0807   CALCIUM 9.2 01/27/2014 0923   GFRNONAA 41 (L) 11/29/2022 0807   GFRNONAA 53 (L) 05/30/2013 1523   GFRAA 48 (L) 06/01/2020 0928   GFRAA 62 05/30/2013 1523    BNP    Component Value Date/Time   BNP 160.0 (H) 11/11/2022 2103    ProBNP No results found for:  "PROBNP"  Imaging: DG CHEST PORT 1 VIEW  Result Date: 11/16/2022 CLINICAL DATA:  Chest tube placement EXAM: PORTABLE CHEST 1 VIEW COMPARISON:  CXR 11/16/22 FINDINGS: Right-sided chest port in place with the tip in the right atrium. Interval placement of a right-sided thoracostomy tube with the tip positioned along the medial margin of the right mid lung. There is interval decrease in the size of the right-sided pleural effusion. Unchanged focal right perihilar consolidative opacity. Pneumothorax. Surgical clips in the right axilla. Cardiac and mediastinal contours are unchanged. Visualized upper abdomen is unremarkable. Surgical clips in the  right upper quadrant. IMPRESSION: Interval placement of a right-sided thoracostomy tube with the tip positioned along the medial margin of the right mid lung. Interval decrease in the size of the right-sided pleural effusion. No pneumothorax. Electronically Signed   By: Marin Roberts M.D.   On: 11/16/2022 15:30   DG CHEST PORT 1 VIEW  Result Date: 11/16/2022 CLINICAL DATA:  Chest pain, pleural effusion EXAM: PORTABLE CHEST 1 VIEW COMPARISON:  Portable exam 1043 hours compared to 11/14/2022 FINDINGS: RIGHT jugular Port-A-Cath with tip projecting over SVC. Normal heart size, mediastinal contours, and pulmonary vascularity. Atherosclerotic calcification aorta. Increased RIGHT pleural effusion and basilar atelectasis. LEFT lung clear. No pneumothorax or acute osseous findings. IMPRESSION: Slightly increased RIGHT pleural effusion and basilar atelectasis. Aortic Atherosclerosis (ICD10-I70.0). Electronically Signed   By: Lavonia Dana M.D.   On: 11/16/2022 11:04   IR IMAGING GUIDED PORT INSERTION  Result Date: 11/15/2022 CLINICAL DATA:  Metastatic lung cancer, access for chemotherapy EXAM: RIGHT INTERNAL JUGULAR SINGLE LUMEN POWER PORT CATHETER INSERTION Date:  11/15/2022 11/15/2022 3:36 pm Radiologist:  Jerilynn Mages. Daryll Brod, MD Guidance:  Ultrasound and fluoroscopic MEDICATIONS:  1% lidocaine local with epinephrine ANESTHESIA/SEDATION: Versed 2.0 mg IV; Fentanyl 100 mcg IV; Moderate Sedation Time:  36 minute The patient was continuously monitored during the procedure by the interventional radiology nurse under my direct supervision. FLUOROSCOPY: 0 minutes, 36 seconds (3 mGy) COMPLICATIONS: None immediate. CONTRAST:  None. PROCEDURE: Informed consent was obtained from the patient following explanation of the procedure, risks, benefits and alternatives. The patient understands, agrees and consents for the procedure. All questions were addressed. A time out was performed. Maximal barrier sterile technique utilized including caps, mask, sterile gowns, sterile gloves, large sterile drape, hand hygiene, and 2% chlorhexidine scrub. Under sterile conditions and local anesthesia, right internal jugular micropuncture venous access was performed. Access was performed with ultrasound. Images were obtained for documentation of the patent right internal jugular vein. A guide wire was inserted followed by a transitional dilator. This allowed insertion of a guide wire and catheter into the IVC. Measurements were obtained from the SVC / RA junction back to the right IJ venotomy site. In the right infraclavicular chest, a subcutaneous pocket was created over the second anterior rib. This was done under sterile conditions and local anesthesia. 1% lidocaine with epinephrine was utilized for this. A 2.5 cm incision was made in the skin. Blunt dissection was performed to create a subcutaneous pocket over the right pectoralis major muscle. The pocket was flushed with saline vigorously. There was adequate hemostasis. The port catheter was assembled and checked for leakage. The port catheter was secured in the pocket with two retention sutures. The tubing was tunneled subcutaneously to the right venotomy site and inserted into the SVC/RA junction through a valved peel-away sheath. Position was confirmed with  fluoroscopy. Images were obtained for documentation. The patient tolerated the procedure well. No immediate complications. Incisions were closed in a two layer fashion with 4 - 0 Vicryl suture. Dermabond was applied to the skin. The port catheter was accessed, blood was aspirated followed by saline and heparin flushes. Needle was removed. A dry sterile dressing was applied. IMPRESSION: Ultrasound and fluoroscopically guided right internal jugular single lumen power port catheter insertion. Tip in the SVC/RA junction. Catheter ready for use. Electronically Signed   By: Jerilynn Mages.  Shick M.D.   On: 11/15/2022 15:44   DG Chest Port 1 View  Result Date: 11/14/2022 CLINICAL DATA:  Chest pain with shortness of breath. Malignant pleural effusion  post recent right thoracentesis. EXAM: PORTABLE CHEST 1 VIEW COMPARISON:  Radiographs 11/13/2022 and 11/12/2022.  CT 11/11/2022. FINDINGS: 0522 hours. The heart size and mediastinal contours are stable. Residual right pleural effusion, right basilar airspace disease and known right middle lobe mass are grossly unchanged. There is mild left basilar atelectasis. No pneumothorax. The bones are unchanged. There are surgical clips in the right breast and right axilla. IMPRESSION: No evidence of pneumothorax following recent right thoracentesis. Residual right pleural effusion and right basilar airspace disease are grossly unchanged. Electronically Signed   By: Richardean Sale M.D.   On: 11/14/2022 08:21   DG Chest Port 1 View  Result Date: 11/13/2022 CLINICAL DATA:  Follow-up pleural effusion EXAM: PORTABLE CHEST 1 VIEW COMPARISON:  11/12/2022 FINDINGS: Alveolar consolidation right base with interval improvement. Moderate right-sided pleural effusion. Left lung clear. No pneumothorax. Aorta is calcified. IMPRESSION: Right basilar consolidation consistent with pneumonia with some improvement. Electronically Signed   By: Sammie Bench M.D.   On: 11/13/2022 12:08   ECHOCARDIOGRAM  COMPLETE  Result Date: 11/12/2022    ECHOCARDIOGRAM REPORT   Patient Name:   Crystal [name] Date of Exam: 11/12/2022 Medical Rec #:  [id number]       Height:       64.0 in Accession #:    [id number]      Weight:       214.0 lb Date of Birth:  03/31/1946        BSA:          2.013 m Patient Age:    68 years        BP:           168/91 mmHg Patient Gender: F               HR:           90 bpm. Exam Location:  Inpatient Procedure: 2D Echo and Intracardiac Opacification Agent Indications:    pulmonary embolus  History:        Patient has prior history of Echocardiogram examinations, most                 recent 02/03/2020. CAD, Arrythmias:Atrial Fibrillation; Risk                 Factors:Hypertension and Dyslipidemia.  Sonographer:    Harvie Junior Referring Phys: [id number] JEFFREY T [facility]  Sonographer Comments: Technically difficult study due to poor echo windows and patient is obese. Image acquisition challenging due to patient body habitus. IMPRESSIONS  1. Left ventricular ejection fraction, by estimation, is 45 to 50%. The left ventricle has mildly decreased function. The left ventricle demonstrates global hypokinesis. There is mild left ventricular hypertrophy. Left ventricular diastolic parameters are consistent with Grade I diastolic dysfunction (impaired relaxation).  2. Right ventricular systolic function is normal. The right ventricular size is normal. There is normal pulmonary artery systolic pressure. The estimated right ventricular systolic pressure is 61.6 mmHg.  3. The mitral valve is normal in structure. Trivial mitral valve regurgitation. No evidence of mitral stenosis.  4. The aortic valve was not well visualized. Aortic valve regurgitation is not visualized. No aortic stenosis is present.  5. The inferior vena cava is normal in size with greater than 50% respiratory variability, suggesting right atrial pressure of 3 mmHg. FINDINGS  Left Ventricle: Left ventricular ejection fraction, by estimation, is 45 to  50%. The left ventricle has mildly decreased function. The left ventricle demonstrates global hypokinesis. The left ventricular internal  cavity size was normal in size. There is  mild left ventricular hypertrophy. Left ventricular diastolic parameters are consistent with Grade I diastolic dysfunction (impaired relaxation). Right Ventricle: The right ventricular size is normal. No increase in right ventricular wall thickness. Right ventricular systolic function is normal. There is normal pulmonary artery systolic pressure. The tricuspid regurgitant velocity is 2.22 m/s, and  with an assumed right atrial pressure of 3 mmHg, the estimated right ventricular systolic pressure is 97.9 mmHg. Left Atrium: Left atrial size was normal in size. Right Atrium: Right atrial size was normal in size. Pericardium: There is no evidence of pericardial effusion. Presence of epicardial fat layer. Mitral Valve: The mitral valve is normal in structure. Trivial mitral valve regurgitation. No evidence of mitral valve stenosis. Tricuspid Valve: The tricuspid valve is normal in structure. Tricuspid valve regurgitation is trivial. Aortic Valve: The aortic valve was not well visualized. Aortic valve regurgitation is not visualized. No aortic stenosis is present. Aortic valve mean gradient measures 4.0 mmHg. Aortic valve peak gradient measures 7.7 mmHg. Aortic valve area, by VTI measures 2.05 cm. Pulmonic Valve: The pulmonic valve was not well visualized. Pulmonic valve regurgitation is trivial. Aorta: The aortic root and ascending aorta are structurally normal, with no evidence of dilitation. Venous: The inferior vena cava is normal in size with greater than 50% respiratory variability, suggesting right atrial pressure of 3 mmHg. IAS/Shunts: No atrial level shunt detected by color flow Doppler.  LEFT VENTRICLE PLAX 2D LVIDd:         4.60 cm     Diastology LVIDs:         3.90 cm     LV e' medial:    3.92 cm/s LV PW:         0.80 cm     LV E/e'  medial:  16.7 LV IVS:        0.80 cm     LV e' lateral:   7.83 cm/s LVOT diam:     2.30 cm     LV E/e' lateral: 8.4 LV SV:         53 LV SV Index:   26 LVOT Area:     4.15 cm  LV Volumes (MOD) LV vol d, MOD A2C: 57.6 ml LV vol d, MOD A4C: 75.0 ml LV vol s, MOD A2C: 29.0 ml LV vol s, MOD A4C: 38.5 ml LV SV MOD A2C:     28.6 ml LV SV MOD A4C:     75.0 ml LV SV MOD BP:      35.1 ml RIGHT VENTRICLE RV Basal diam:  2.60 cm RV Mid diam:    2.30 cm RV S prime:     11.20 cm/s TAPSE (M-mode): 1.5 cm LEFT ATRIUM             Index        RIGHT ATRIUM          Index LA diam:        3.20 cm 1.59 cm/m   RA Area:     7.32 cm LA Vol (A2C):   37.9 ml 18.82 ml/m  RA Volume:   11.90 ml 5.91 ml/m LA Vol (A4C):   29.8 ml 14.80 ml/m LA Biplane Vol: 35.5 ml 17.63 ml/m  AORTIC VALVE                    PULMONIC VALVE AV Area (Vmax):    2.08 cm     PV Vmax:  0.96 m/s AV Area (Vmean):   2.01 cm     PV Peak grad:     3.7 mmHg AV Area (VTI):     2.05 cm     PR End Diast Vel: 3.61 msec AV Vmax:           139.00 cm/s AV Vmean:          96.200 cm/s AV VTI:            0.259 m AV Peak Grad:      7.7 mmHg AV Mean Grad:      4.0 mmHg LVOT Vmax:         69.70 cm/s LVOT Vmean:        46.600 cm/s LVOT VTI:          0.128 m LVOT/AV VTI ratio: 0.49  AORTA Ao Root diam: 3.70 cm Ao Asc diam:  3.30 cm MITRAL VALVE                TRICUSPID VALVE MV Area (PHT): 5.54 cm     TR Peak grad:   19.7 mmHg MV Decel Time: 137 msec     TR Vmax:        222.00 cm/s MV E velocity: 65.50 cm/s MV A velocity: 109.00 cm/s  SHUNTS MV E/A ratio:  0.60         Systemic VTI:  0.13 m                             Systemic Diam: 2.30 cm Oswaldo Milian MD Electronically signed by Oswaldo Milian MD Signature Date/Time: 11/12/2022/2:27:15 PM    Final    VAS [place] LOWER EXTREMITY VENOUS (DVT)  Result Date: 11/12/2022  Lower Venous DVT Study Patient Name:  Crystal [name]  Date of Exam:   11/12/2022 Medical Rec #: [id number]        Accession #:    [id number]  Date of Birth: 09/16/1946         Patient Gender: F Patient Age:   60 years Exam Location:  [facility] Procedure:      VAS [place] LOWER EXTREMITY VENOUS (DVT) Referring Phys: Wandra Feinstein RATHORE --------------------------------------------------------------------------------  Indications: Pulmonary embolism.  Comparison Study: no prior Performing Technologist: Archie Patten RVS  Examination Guidelines: A complete evaluation includes B-mode imaging, spectral Doppler, color Doppler, and power Doppler as needed of all accessible portions of each vessel. Bilateral testing is considered an integral part of a complete examination. Limited examinations for reoccurring indications may be performed as noted. The reflux portion of the exam is performed with the patient in reverse Trendelenburg.  +--------+---------------+---------+-----------+----------+--------------------+ RIGHT   CompressibilityPhasicitySpontaneityPropertiesThrombus Aging       +--------+---------------+---------+-----------+----------+--------------------+ CFV     Full           Yes      Yes                                       +--------+---------------+---------+-----------+----------+--------------------+ SFJ     Full                                                              +--------+---------------+---------+-----------+----------+--------------------+  FV Prox Full                                                              +--------+---------------+---------+-----------+----------+--------------------+ FV Mid  Full                                                              +--------+---------------+---------+-----------+----------+--------------------+ FV      Full                                                              Distal                                                                    +--------+---------------+---------+-----------+----------+--------------------+ PFV     Full                                                               +--------+---------------+---------+-----------+----------+--------------------+ POP     Full           Yes      Yes                                       +--------+---------------+---------+-----------+----------+--------------------+ PTV     Full                                                              +--------+---------------+---------+-----------+----------+--------------------+ PERO                   Yes      Yes                  patent by color                                                           doppler              +--------+---------------+---------+-----------+----------+--------------------+   +--------+---------------+---------+-----------+----------+--------------------+ LEFT    CompressibilityPhasicitySpontaneityPropertiesThrombus Aging       +--------+---------------+---------+-----------+----------+--------------------+  CFV     Full           Yes      Yes                                       +--------+---------------+---------+-----------+----------+--------------------+ SFJ     Full                                                              +--------+---------------+---------+-----------+----------+--------------------+ FV Prox Full                                                              +--------+---------------+---------+-----------+----------+--------------------+ FV Mid  Full                                                              +--------+---------------+---------+-----------+----------+--------------------+ FV                     Yes      Yes                                       Distal                                                                    +--------+---------------+---------+-----------+----------+--------------------+ PFV     Full                                                               +--------+---------------+---------+-----------+----------+--------------------+ POP     Full           Yes      Yes                                       +--------+---------------+---------+-----------+----------+--------------------+ PTV     None                                         in a single paired  vein                 +--------+---------------+---------+-----------+----------+--------------------+ PERO                   Yes      Yes                  patent by color                                                           doppler              +--------+---------------+---------+-----------+----------+--------------------+     Summary: RIGHT: - There is no evidence of deep vein thrombosis in the lower extremity.  - No cystic structure found in the popliteal fossa.  LEFT: - Findings consistent with age indeterminate deep vein thrombosis involving the left posterior tibial veins. - No cystic structure found in the popliteal fossa.  *See table(s) above for measurements and observations. Electronically signed by Harold Barban MD on 11/12/2022 at 2:15:42 PM.    Final    US THORACENTESIS ASP PLEURAL SPACE W/IMG GUIDE  Result Date: 11/12/2022 INDICATION: History of breast cancer with recurrent right pleural effusion currently admitted for acute PE. Request received for therapeutic right thoracentesis. EXAM: ULTRASOUND GUIDED THERAPEUTIC RIGHT THORACENTESIS MEDICATIONS: 15 mL 1 % lidocaine COMPLICATIONS: None immediate. PROCEDURE: An ultrasound guided thoracentesis was thoroughly discussed with the patient and questions answered. The benefits, risks, alternatives and complications were also discussed. The patient understands and wishes to proceed with the procedure. Written consent was obtained. Ultrasound was performed to localize and mark an adequate pocket of fluid in the right chest. The area was then prepped and draped  in the normal sterile fashion. 1% Lidocaine was used for local anesthesia. Under ultrasound guidance a 6 Fr Safe-T-Centesis catheter was introduced. Thoracentesis was performed. The catheter was removed and a dressing applied. FINDINGS: A total of approximately 800 cc of clear, yellow fluid was removed. IMPRESSION: Successful ultrasound guided right thoracentesis yielding 800 cc of pleural fluid. Read by: Narda Rutherford, AGNP-BC Electronically Signed   By: Miachel Roux M.D.   On: 11/12/2022 11:57   DG Chest 1 View  Result Date: 11/12/2022 CLINICAL DATA:  Post thoracentesis right lung. EXAM: CHEST  1 VIEW COMPARISON:  11/11/2022 FINDINGS: Lungs are hypoinflated as patient is post right thoracentesis. Interval improvement in patient's right pleural effusion. There is persistent moderate opacification over the right mid to lower lung likely combination of atelectasis and residual fluid. No pneumothorax. Left lung is clear. Cardiomediastinal silhouette and remainder of the exam is unchanged. IMPRESSION: Interval improvement in patient's right pleural effusion post thoracentesis. Persistent moderate opacification over the right mid to lower lung likely combination of atelectasis and residual fluid. Electronically Signed   By: Marin Olp M.D.   On: 11/12/2022 09:41   DG Chest 2 View  Result Date: 11/11/2022 CLINICAL DATA:  Lung cancer, right pleural effusion EXAM: CHEST - 2 VIEW COMPARISON:  10/27/2022 FINDINGS: Moderate to large right pleural effusion has increased in size since prior examination with compressive atelectasis of the right lung base. Left lung is clear. No pneumothorax. No pleural effusion on the left. Cardiac size within normal limits. Surgical clips are seen within the right axilla. No acute bone abnormality. IMPRESSION: 1. Moderate  to large right pleural effusion, increased in size since prior examination. Electronically Signed   By: Fidela Salisbury M.D.   On: 11/11/2022 23:36   CT Angio Chest  PE W and/or Wo Contrast  Result Date: 11/11/2022 CLINICAL DATA:  Lung cancer, pleural effusion. Pulmonary embolism (PE) suspected, low to intermediate prob, positive D-dimer EXAM: CT ANGIOGRAPHY CHEST WITH CONTRAST TECHNIQUE: Multidetector CT imaging of the chest was performed using the standard protocol during bolus administration of intravenous contrast. Multiplanar CT image reconstructions and MIPs were obtained to evaluate the vascular anatomy. RADIATION DOSE REDUCTION: This exam was performed according to the departmental dose-optimization program which includes automated exposure control, adjustment of the mA and/or kV according to patient size and/or use of iterative reconstruction technique. CONTRAST:  48mL OMNIPAQUE IOHEXOL 350 MG/ML SOLN COMPARISON:  09/13/2022 FINDINGS: Cardiovascular: There are multiple small intraluminal filling defects identified within the segmental pulmonary arteries of the left upper lobe and left lower lobe in keeping with acute pulmonary emboli. The embolic burden is small. The central pulmonary arteries are of normal caliber. No CT evidence of right heart strain (RV/LV ratio 0.74). Mild coronary artery calcification. Global cardiac size within normal limits. No pericardial effusion. Mild atherosclerotic calcification within the thoracic aorta. No aortic aneurysm. Mediastinum/Nodes: Visualized thyroid is unremarkable. No pathologic thoracic adenopathy. Right axillary lymph node dissection has been performed. Esophagus is unremarkable. Lungs/Pleura: Large right pleural effusion has enlarged since prior examination with near complete collapse of the right lower lobe. Right middle lobe pulmonary mass is difficult to accurately measure given superimposed pulmonary collapse but measures at least 2.7 x 5.3 cm at axial image # 51/5. Progressive right middle lobe volume loss. Progressive compressive atelectasis of the right upper lobe. Left lung is clear. No pneumothorax. No pleural  effusion on the left. There is obliteration of the right middle lobar segmental pulmonary bronchi by the malignant mass, progressive since prior examination Upper Abdomen: No acute abnormality. Musculoskeletal: Interval development of a acute to subacute fracture of the left ninth rib laterally. No focal lytic or blastic bone lesions. Review of the MIP images confirms the above findings. IMPRESSION: 1. Acute pulmonary emboli within the segmental pulmonary arteries of the left upper lobe and left lower lobe. The embolic burden is small. No CT evidence of right heart strain. 2. Interval enlargement of a large right pleural effusion with near complete collapse of the right lower lobe. 3. Right middle lobe pulmonary mass, difficult to accurately measure given superimposed pulmonary collapse but similar in size measuring 5.3 x 2.7 cm. Progressive obliteration of the right middle lobe segmental pulmonary bronchi by the malignant mass with increasing right middle lobe volume loss. 4. Mild coronary artery calcification. 5. Acute to subacute fracture of the left ninth rib laterally. Aortic Atherosclerosis (ICD10-I70.0). Electronically Signed   By: Fidela Salisbury M.D.   On: 11/11/2022 23:34   DG Chest 2 View  Result Date: 11/11/2022 CLINICAL DATA:  Shortness of breath. EXAM: CHEST - 2 VIEW COMPARISON:  Chest radiograph dated 11/10/2022. FINDINGS: Moderate right pleural effusion and right lung base atelectasis or infiltrate similar or slightly progressed since the prior radiograph. The left lung is clear. No pneumothorax. Stable cardiomediastinal silhouette. Atherosclerotic calcification of the aortic arch. No acute osseous pathology. IMPRESSION: Moderate right pleural effusion and right lung base atelectasis or infiltrate. Electronically Signed   By: Anner Crete M.D.   On: 11/11/2022 22:08   IR KYPHO LUMBAR INC FX REDUCE BONE BX UNI/BIL CANNULATION INC/IMAGING  Result Date: 11/07/2022  CLINICAL DATA:  Non-small  cell lung cancer, L2 metastasis EXAM: L2 VERTEBRAL BODY BIOPSY, RADIOFREQUENCY (OsteoCool) ABLATION and KYPHOPLASTY AUGMENTATION COMPARISON:  CT and MR L-spine, 09/13/2022. MEDICATIONS: Ancef 2 g IV; The antibiotic was administered in an appropriate time interval prior to needle puncture of the skin. Dilaudid 1 mg IV. 0.5 % bupivacaine IM within the access level paraspinal muscles. Zofran 4 mg IV. ANESTHESIA/SEDATION: Moderate (conscious) sedation was employed during this procedure. A total of Versed 3 mg and Fentanyl 150 mcg was administered intravenously. Moderate Sedation Time: 110 minutes. The patient's level of consciousness and vital signs were monitored continuously by radiology nursing throughout the procedure under my direct supervision. FLUOROSCOPY TIME:  Fluoroscopic dose; 621 mGy COMPLICATIONS: None immediate. TECHNIQUE: Informed written consent was obtained from the patient after a thorough discussion of the procedural risks, benefits and alternatives. All questions were addressed. Maximal Sterile Barrier Technique was utilized including caps, mask, sterile gowns, sterile gloves, sterile drape, hand hygiene and skin antiseptic. A timeout was performed prior to the initiation of the procedure. The patient was placed prone on the fluoroscopic table. The skin overlying the lumbar spine region was then prepped and draped in the usual sterile fashion. Maximal barrier sterile technique was utilized including caps, mask, sterile gowns, sterile gloves, sterile drape, hand hygiene and skin antiseptic. The RIGHT pedicle at L2 was then infiltrated with 1% lidocaine followed by the advancement of a Kyphon trocar needle through the RIGHT pedicle into the posterior one-third of the vertebral body. Subsequently, the osteo drill was advanced to the anterior third of the vertebral body. The osteo drill was retracted. Through the working cannula, a 15 mm OsteoCoolRF ablation probe was inserted and positioned under  fluoroscopic guidance. In similar fashion, the LEFT L2 pedicle was infiltrated with 1% lidocaine. Utilizing a extra pedicular approach, a second Kyphon trocar needle was advanced into the posterior third of the vertebral body. Subsequently, the osteo drill was coaxially advanced to the anterior right third. The osteo drill was exchanged for a 15 mm OsteoCool RF ablation probe which was positioned under fluoroscopic guidance. With both OsteoCool ablation probes in place, the ablation was performed for 7.5 minutes. Attention was now paid towards the kyphoplasty portion of the procedure. A Kyphon inflatable bone tamp 15 x 3 was advanced through both working cannulas and positioned with the distal marker approximately 5 mm from the anterior aspect of the cortex. Appropriate positioning was confirmed on the AP projection. At this time, the balloon was expanded using contrast via a Kyphon inflation syringe device via micro tubing. Inflations were continued under direct fluoroscopic guidance. At this time, methylmethacrylate mixture was reconstituted in the Kyphon bone mixing device system. This was then loaded into the delivery mechanism, attached to Kyphon bone fillers. The balloons were deflated and removed followed by the instillation of methylmethacrylate mixture with excellent filling in the AP and lateral projections. The working cannulae and the bone filler were then retrieved and removed. Multiple spot radiographic images were obtained in various obliquities. Hemostasis was achieved with manual compression. The patient tolerated the procedure well without immediate postprocedural complication. FINDINGS: *Adequate cement filling of the L2 vertebral body on both the AP and lateral projections. *No extravasation was noted in the disk spaces or posteriorly into the spinal canal or along the pedicular access. *No epidural venous contamination was seen. IMPRESSION: Successful L2 vertebral body biopsy, radiofrequency  "OsteoCool" ablation and bi-pedicular cement augmentation with balloon kyphoplasty, as above. PLAN: The patient will return to Vascular Inter[facility]  Radiology (VIR) for clinic follow-up in 4 weeks. Michaelle Birks, MD [facility] Electronically Signed   By: Michaelle Birks M.D.   On: 11/07/2022 17:24   IR Bone Tumor(s)RF Ablation  Result Date: 11/07/2022 CLINICAL DATA:  Non-small cell lung cancer, L2 metastasis EXAM: L2 VERTEBRAL BODY BIOPSY, RADIOFREQUENCY (OsteoCool) ABLATION and KYPHOPLASTY AUGMENTATION COMPARISON:  CT and MR L-spine, 09/13/2022. MEDICATIONS: Ancef 2 g IV; The antibiotic was administered in an appropriate time interval prior to needle puncture of the skin. Dilaudid 1 mg IV. 0.5 % bupivacaine IM within the access level paraspinal muscles. Zofran 4 mg IV. ANESTHESIA/SEDATION: Moderate (conscious) sedation was employed during this procedure. A total of Versed 3 mg and Fentanyl 150 mcg was administered intravenously. Moderate Sedation Time: 110 minutes. The patient's level of consciousness and vital signs were monitored continuously by radiology nursing throughout the procedure under my direct supervision. FLUOROSCOPY TIME:  Fluoroscopic dose; 326 mGy COMPLICATIONS: None immediate. TECHNIQUE: Informed written consent was obtained from the patient after a thorough discussion of the procedural risks, benefits and alternatives. All questions were addressed. Maximal Sterile Barrier Technique was utilized including caps, mask, sterile gowns, sterile gloves, sterile drape, hand hygiene and skin antiseptic. A timeout was performed prior to the initiation of the procedure. The patient was placed prone on the fluoroscopic table. The skin overlying the lumbar spine region was then prepped and draped in the usual sterile fashion. Maximal barrier sterile technique was utilized including caps, mask, sterile gowns, sterile gloves, sterile drape, hand  hygiene and skin antiseptic. The RIGHT pedicle at L2 was then infiltrated with 1% lidocaine followed by the advancement of a Kyphon trocar needle through the RIGHT pedicle into the posterior one-third of the vertebral body. Subsequently, the osteo drill was advanced to the anterior third of the vertebral body. The osteo drill was retracted. Through the working cannula, a 15 mm OsteoCoolRF ablation probe was inserted and positioned under fluoroscopic guidance. In similar fashion, the LEFT L2 pedicle was infiltrated with 1% lidocaine. Utilizing a extra pedicular approach, a second Kyphon trocar needle was advanced into the posterior third of the vertebral body. Subsequently, the osteo drill was coaxially advanced to the anterior right third. The osteo drill was exchanged for a 15 mm OsteoCool RF ablation probe which was positioned under fluoroscopic guidance. With both OsteoCool ablation probes in place, the ablation was performed for 7.5 minutes. Attention was now paid towards the kyphoplasty portion of the procedure. A Kyphon inflatable bone tamp 15 x 3 was advanced through both working cannulas and positioned with the distal marker approximately 5 mm from the anterior aspect of the cortex. Appropriate positioning was confirmed on the AP projection. At this time, the balloon was expanded using contrast via a Kyphon inflation syringe device via micro tubing. Inflations were continued under direct fluoroscopic guidance. At this time, methylmethacrylate mixture was reconstituted in the Kyphon bone mixing device system. This was then loaded into the delivery mechanism, attached to Kyphon bone fillers. The balloons were deflated and removed followed by the instillation of methylmethacrylate mixture with excellent filling in the AP and lateral projections. The working cannulae and the bone filler were then retrieved and removed. Multiple spot radiographic images were obtained in various obliquities. Hemostasis was  achieved with manual compression. The patient tolerated the procedure well without immediate postprocedural complication. FINDINGS: *Adequate cement filling of the L2 vertebral body on both the AP and lateral projections. *No extravasation was noted in the disk spaces or posteriorly  into the spinal canal or along the pedicular access. *No epidural venous contamination was seen. IMPRESSION: Successful L2 vertebral body biopsy, radiofrequency "OsteoCool" ablation and bi-pedicular cement augmentation with balloon kyphoplasty, as above. PLAN: The patient will return to Vascular Interventional Radiology (VIR) for clinic follow-up in 4 weeks. Michaelle Birks, MD [facility] Electronically Signed   By: Michaelle Birks M.D.   On: 11/07/2022 17:24     Assessment & Plan:   No problem-specific Assessment & Plan notes found for this encounter.     Martyn Ehrich, NP 11/29/2022

## 2022-11-29 NOTE — Progress Notes (Signed)
Pharmacist Chemotherapy Monitoring - Initial Assessment    Anticipated start date: 11/29/22   The following has been reviewed per standard work regarding the patient's treatment regimen: The patient's diagnosis, treatment plan and drug doses, and organ/hematologic function Lab orders and baseline tests specific to treatment regimen  The treatment plan start date, drug sequencing, and pre-medications Prior authorization status  Patient's documented medication list, including drug-drug interaction screen and prescriptions for anti-emetics and supportive care specific to the treatment regimen The drug concentrations, fluid compatibility, administration routes, and timing of the medications to be used The patient's access for treatment and lifetime cumulative dose history, if applicable  The patient's medication allergies and previous infusion related reactions, if applicable   Changes made to treatment plan:  N/A  Follow up needed:  N/A   Patrica Duel, RPH, 11/29/2022  9:50 AM

## 2022-11-30 ENCOUNTER — Ambulatory Visit: Payer: No Typology Code available for payment source | Admitting: Primary Care

## 2022-11-30 ENCOUNTER — Telehealth: Payer: Self-pay

## 2022-11-30 ENCOUNTER — Encounter: Payer: Self-pay | Admitting: Primary Care

## 2022-11-30 VITALS — BP 118/58 | HR 80 | Ht 65.0 in | Wt 210.0 lb

## 2022-11-30 DIAGNOSIS — J90 Pleural effusion, not elsewhere classified: Secondary | ICD-10-CM | POA: Diagnosis not present

## 2022-11-30 DIAGNOSIS — I2699 Other pulmonary embolism without acute cor pulmonale: Secondary | ICD-10-CM

## 2022-11-30 NOTE — Telephone Encounter (Signed)
[place]  Telephone call to patient post her first time Keytruda/Alimta/Carboplatin infusion. Unable to reach patient and unable to leave a voice message.

## 2022-11-30 NOTE — Assessment & Plan Note (Signed)
-   Acute recurrent PE and acute left posterior tibial vein DVT in the setting of active metastatic lung cancer. Patient has no symptoms of shortness of breath or chest pain. She is not requiring supplemental oxygen. No bleeding note since being on blood thinner. Continue Eliquis 5mg  twice daily, will need life long anticoagulation.

## 2022-11-30 NOTE — Assessment & Plan Note (Signed)
-   Diagnosed with metastatic non-small cell lung cancer in November 2023. Status post radiation and recently started chemotherapy. She has chronic malignant right pleural effusion, Pleurx catheter placed by Dr. Erin Fulling on 11/16/2022.  Patient has been draining Pleurx catheter daily, the last 3 days output has been approximately 25 mL.  Advise she decrease drainage to 3 times/week and if getting less than 250 cc consecutively can decrease to twice weekly and prn shortness of breath.  Removed 2 sutures from Pleurx catheter site today. Tolerated procedure well.  No complications. Clean sterile dressing placed.  Advised patient notify office if she develops fever, pain with drainage or bleeding.  She will follow-up in 3 to 4 weeks with Dr. Erin Fulling or [facility] NP.

## 2022-11-30 NOTE — Patient Instructions (Signed)
Very nice meeting you today Crystal [name]  Recommendations: - Drain pleurX three times a week (MWF) and if symptomatic, if getting <279ml consecutively x3 then decrease to twice a week - If you develop fever, pain with drainage or bleeding please notify office   Follow-up: 3-4 weeks with Dr. Erin Fulling (if no openings ok to put with Beth Np)  Indwelling Pleural Catheter Home Guide  An indwelling pleural catheter is a thin, flexible tube that is inserted under your skin and into your chest. The catheter drains excess fluid that collects in the area between the chest wall and the lungs (pleural space).After the catheter is inserted, it can be attached to a container that collects fluid. The pleural catheter will allow you to drain fluid from your chest at home on a regular basis (sometimes daily). This will eliminate the need for frequent visits to the hospital or clinic to drain the fluid. The catheter may be removed after the excess fluid problem is resolved, usually after 2-3 months. It is important to follow instructions from your health care provider about how to drain and care for your catheter. What are the risks? Generally, this is a safe procedure. However, problems may occur, including: Infection. Skin damage around the catheter. Lung damage. Failure of the pleural catheter to work properly. Spreading of cancer cells along the catheter, if you have cancer. Supplies needed: Vacuum-sealed drainage container with attached drainage line. Germ-free (sterile) dressing. Sterile alcohol pads. Sterile gloves. Valve cap. Sterile gauze pads, 4 inches  4 inches (10 cm  10 cm). Tape. Adhesive dressing. Sterile foam catheter pad. How to care for your catheter and insertion site Wash your hands with soap and warm water for at least 20 seconds before and after touching the catheter or insertion site. If soap and water are not available, use hand sanitizer. Change your dressing regularly, usually  every week, or as often as told by your health care provider. Keep the skin around the catheter clean and dry. Check the catheter regularly for any cracks or kinks in the tubing. Check your catheter insertion site every day for signs of infection. Check for: Redness, swelling, or pain. Fluid or blood. Warmth. Pus or a bad smell. How to drain your catheter Follow instructions from your health care provider about how often to drain your catheter. You may also refer to instructions that come with the pleural catheter drainage system. To drain the catheter: Wash your hands with soap and warm water for at least 20 seconds. If soap and water are not available, use hand sanitizer. Carefully remove the dressing from around the catheter. Do not discard unless performing a dressing change. Wash your hands again. Put on sterile gloves. Prepare the vacuum-sealed drainage container and drainage line. Close the drainage line of the vacuum-sealed drainage container by squeezing the pinch clamp or rolling the wheel of the roller clamp toward the container. The vacuum in the container will be lost if the line is not closed completely. Remove the access tip cover from the drainage line. Do not touch the end. Set it on a sterile surface. Remove the catheter valve cap and throw it away. Use an alcohol pad to clean the end of the catheter. Insert the access tip into the catheter valve. Make sure the valve and access tip are securely connected. Listen for a click to confirm that they are connected. Insert the T plunger to break the vacuum seal on the drainage container. Open the clamp on the drainage line.  Allow the catheter to drain. Keep the catheter and the drainage container below the level of your chest. There may be a one-way valve on the end of the tubing that will allow liquid and air to flow out of the catheter without letting air inside. Drain the amount of fluid as told by your health care provider. It  usually takes 5-15 minutes. Do not drain more than 1000 mL of fluid. You may feel a little discomfort while you are draining. If the pain is severe, stop draining and contact your health care provider. After you finish draining the catheter, remove the drainage container tubing from the catheter. Use a clean alcohol pad to wipe the catheter tip. Place a clean cap on the end of the catheter. Use an alcohol pad to clean the skin around the catheter. Allow the skin to air-dry. Put the catheter pad on your skin. Curl the catheter into loops and place it on the pad. Do not place the catheter on your skin. Replace the dressing over the catheter. Discard the drainage container as instructed by your health care provider. Do not reuse the drainage container. Wash your hands with soap and warm water for at least 20 seconds. If soap and water are not available, use hand sanitizer. How to change your dressing Change your dressing at least once a week, or more often if needed to keep the dressing dry. Be sure to change the dressing whenever it becomes moist. Your health care provider will tell you how often to change your dressing. Wash your hands with soap and warm water for at least 20 seconds. If soap and water are not available, use hand sanitizer. Gently remove the old dressing. Avoid using scissors to remove the dressing. Sharp objects may damage the catheter. Wash the skin around the insertion site with mild, fragrance-free soap and warm water. Rinse well, then pat the area dry with a clean cloth. If your catheter was stitched (sutured) to your skin, look at the suture to make sure it is still anchored in your skin. Do not apply creams, ointments, or alcohol to the area. Let your skin air-dry completely before you apply a new dressing. Curl the catheter into loops and place it on the sterile catheter pad. Do not place the catheter on your skin. If you do not have a pad, use a clean dressing. Slide the  dressing under the disk that holds the drainage catheter in place. Use clean gauze to cover the catheter and the catheter pad. The catheter should rest on the pad or dressing, not on your skin. Tape the dressing to your skin. You may be instructed to use an adhesive dressing covering instead of gauze and tape. Wash your hands with soap and warm water for at least 20 seconds. If soap and water are not available, use hand sanitizer. General tips and recommendations Always wash your hands with soap and warm water for at least 20 seconds before and after caring for your catheter and drainage container. Use a mild, fragrance-free soap. If soap and water are not available, use hand sanitizer. Always make sure there are no leaks or loose connections in the catheter or drainage container. Each time you drain the catheter, note the color and amount of fluid. Do not take baths, swim, or use a hot tub until your health care provider approves. Ask your health care provider if you may take showers. You may only be allowed to take sponge baths. Take deep breaths regularly, followed  by a cough. Doing this can help to prevent lung infection. Contact a health care provider if: You still have pain at the catheter insertion site more than 2 days after your procedure. You have pain while draining your catheter. You have redness, swelling, or pain around your catheter insertion site. You have fluid or blood coming from your catheter insertion site. Your catheter insertion site feels warm to the touch. You have pus or a bad smell coming from your catheter insertion site. You have a fever. Your catheter becomes bent, twisted, or cracked. Get help right away if: You have chest pain. You have dizziness or shortness of breath. The catheter comes out. The catheter is blocked or clogged. These symptoms may be an emergency. Get help right away. Call 911. Do not wait to see if the symptoms will go away. Do not drive  yourself to the hospital. Summary An indwelling pleural catheter is a thin, flexible tube that is inserted under your skin and into your chest to drain excess fluid that collects in the pleural space. It is important to follow instructions from your health care provider about how to drain and care for your catheter. Do not touch the tip of the catheter or the drainage container tubing. Always wash your hands with soap and water before and after caring for your catheter and drainage container. If soap and water are not available, use hand sanitizer. This information is not intended to replace advice given to you by your health care provider. Make sure you discuss any questions you have with your health care provider. Document Revised: 05/03/2021 Document Reviewed: 05/03/2021 Elsevier Patient Education  [place].

## 2022-11-30 NOTE — Telephone Encounter (Signed)
error 

## 2022-12-01 LAB — T4: T4, Total: 6.9 ug/dL (ref 4.5–12.0)

## 2022-12-04 ENCOUNTER — Telehealth: Payer: Self-pay

## 2022-12-04 ENCOUNTER — Other Ambulatory Visit: Payer: Self-pay | Admitting: Nurse Practitioner

## 2022-12-04 DIAGNOSIS — I2699 Other pulmonary embolism without acute cor pulmonale: Secondary | ICD-10-CM

## 2022-12-04 MED ORDER — APIXABAN 5 MG PO TABS: 5.0000 mg | ORAL_TABLET | Freq: Two times a day (BID) | ORAL | 2 refills | Status: DC

## 2022-12-04 NOTE — Telephone Encounter (Signed)
Patient called and request a refill of her Eliquis, the prescription was sent to her pharmacy.

## 2022-12-05 ENCOUNTER — Telehealth: Payer: Self-pay | Admitting: *Deleted

## 2022-12-05 ENCOUNTER — Inpatient Hospital Stay: Payer: No Typology Code available for payment source | Admitting: Nurse Practitioner

## 2022-12-05 ENCOUNTER — Telehealth: Payer: Self-pay

## 2022-12-05 ENCOUNTER — Other Ambulatory Visit (HOSPITAL_BASED_OUTPATIENT_CLINIC_OR_DEPARTMENT_OTHER): Payer: Self-pay

## 2022-12-05 ENCOUNTER — Inpatient Hospital Stay: Payer: No Typology Code available for payment source

## 2022-12-05 VITALS — BP 128/72 | HR 96 | Temp 98.1°F | Resp 18 | Ht 65.0 in | Wt 210.0 lb

## 2022-12-05 VITALS — BP 102/63 | HR 90

## 2022-12-05 DIAGNOSIS — C3490 Malignant neoplasm of unspecified part of unspecified bronchus or lung: Secondary | ICD-10-CM

## 2022-12-05 DIAGNOSIS — Z95828 Presence of other vascular implants and grafts: Secondary | ICD-10-CM

## 2022-12-05 DIAGNOSIS — R197 Diarrhea, unspecified: Secondary | ICD-10-CM | POA: Diagnosis not present

## 2022-12-05 LAB — CBC WITH DIFFERENTIAL (CANCER CENTER ONLY)
Abs Immature Granulocytes: 0.07 10*3/uL (ref 0.00–0.07)
Basophils Absolute: 0 10*3/uL (ref 0.0–0.1)
Basophils Relative: 1 %
Eosinophils Absolute: 0 10*3/uL (ref 0.0–0.5)
Eosinophils Relative: 1 %
HCT: 34.9 % — ABNORMAL LOW (ref 36.0–46.0)
Hemoglobin: 11.4 g/dL — ABNORMAL LOW (ref 12.0–15.0)
Immature Granulocytes: 3 %
Lymphocytes Relative: 39 %
Lymphs Abs: 0.8 10*3/uL (ref 0.7–4.0)
MCH: 29.9 pg (ref 26.0–34.0)
MCHC: 32.7 g/dL (ref 30.0–36.0)
MCV: 91.6 fL (ref 80.0–100.0)
Monocytes Absolute: 0 10*3/uL — ABNORMAL LOW (ref 0.1–1.0)
Monocytes Relative: 1 %
Neutro Abs: 1.2 10*3/uL — ABNORMAL LOW (ref 1.7–7.7)
Neutrophils Relative %: 55 %
Platelet Count: 153 10*3/uL (ref 150–400)
RBC: 3.81 MIL/uL — ABNORMAL LOW (ref 3.87–5.11)
RDW: 14.8 % (ref 11.5–15.5)
WBC Count: 2.1 10*3/uL — ABNORMAL LOW (ref 4.0–10.5)
nRBC: 0 % (ref 0.0–0.2)

## 2022-12-05 LAB — CMP (CANCER CENTER ONLY)
ALT: 12 U/L (ref 0–44)
AST: 21 U/L (ref 15–41)
Albumin: 3 g/dL — ABNORMAL LOW (ref 3.5–5.0)
Alkaline Phosphatase: 51 U/L (ref 38–126)
Anion gap: 7 (ref 5–15)
BUN: 25 mg/dL — ABNORMAL HIGH (ref 8–23)
CO2: 29 mmol/L (ref 22–32)
Calcium: 7.9 mg/dL — ABNORMAL LOW (ref 8.9–10.3)
Chloride: 105 mmol/L (ref 98–111)
Creatinine: 1.52 mg/dL — ABNORMAL HIGH (ref 0.44–1.00)
GFR, Estimated: 35 mL/min — ABNORMAL LOW (ref >60)
Glucose, Bld: 121 mg/dL — ABNORMAL HIGH (ref 70–99)
Potassium: 3.2 mmol/L — ABNORMAL LOW (ref 3.5–5.1)
Sodium: 141 mmol/L (ref 135–145)
Total Bilirubin: 0.7 mg/dL (ref 0.3–1.2)
Total Protein: 5.9 g/dL — ABNORMAL LOW (ref 6.5–8.1)

## 2022-12-05 LAB — MAGNESIUM: Magnesium: 1.4 mg/dL — ABNORMAL LOW (ref 1.7–2.4)

## 2022-12-05 MED ORDER — FLUCONAZOLE 100 MG PO TABS: 100.0000 mg | ORAL_TABLET | Freq: Every day | ORAL | 0 refills | Status: DC

## 2022-12-05 MED ORDER — HEPARIN SOD (PORK) LOCK FLUSH 100 UNIT/ML IV SOLN
500.0000 [IU] | Freq: Once | INTRAVENOUS | Status: AC
  Administered 2022-12-05: 500 [IU] via INTRAVENOUS

## 2022-12-05 MED ORDER — SODIUM CHLORIDE 0.9% FLUSH
10.0000 mL | Freq: Once | INTRAVENOUS | Status: AC
  Administered 2022-12-05: 10 mL via INTRAVENOUS

## 2022-12-05 MED ORDER — POTASSIUM CHLORIDE CRYS ER 10 MEQ PO TBCR: 10.0000 meq | EXTENDED_RELEASE_TABLET | Freq: Two times a day (BID) | ORAL | 1 refills | Status: DC

## 2022-12-05 MED ORDER — NYSTATIN 100000 UNIT/ML MT SUSP
Freq: Two times a day (BID) | OROMUCOSAL | 1 refills | Status: DC
  Filled 2022-12-05: qty 240, 12d supply, fill #0

## 2022-12-05 MED ORDER — MAGNESIUM OXIDE -MG SUPPLEMENT 400 (240 MG) MG PO TABS: 400.0000 mg | ORAL_TABLET | Freq: Every day | ORAL | 1 refills | Status: DC

## 2022-12-05 MED ORDER — NYSTATIN 100000 UNIT/GM EX POWD: 1.0000 | Freq: Three times a day (TID) | CUTANEOUS | 0 refills | Status: DC

## 2022-12-05 MED ORDER — SODIUM CHLORIDE 0.9 % IV SOLN: INTRAVENOUS | Status: AC

## 2022-12-05 NOTE — Patient Instructions (Signed)
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
## 2022-12-05 NOTE — Telephone Encounter (Signed)
Called Crystal [name] and instructed her to come to office now to be evaluated. She will call her niece to come home and bring her. Informed she needs to come asap before provider leaves office. She agrees.

## 2022-12-05 NOTE — Telephone Encounter (Signed)
Call from patient that she has terrible sores in her mouth and back of throat looks red and has white patches. Can barely swallow anything. Sucking on ice chips, but not helping. Family asking for some medicated suckers from [place] that another family member used when they had cancer. Informed her it is doubtful that Dr. Benay Spice would order this, will stay with standard of care medications for these symptoms. She is also having multiple diarrhea stools/day and needs something for this as well.

## 2022-12-05 NOTE — Telephone Encounter (Signed)
Called in and MMW to [facility].

## 2022-12-06 ENCOUNTER — Inpatient Hospital Stay: Payer: No Typology Code available for payment source

## 2022-12-06 ENCOUNTER — Other Ambulatory Visit (HOSPITAL_BASED_OUTPATIENT_CLINIC_OR_DEPARTMENT_OTHER): Payer: Self-pay

## 2022-12-06 ENCOUNTER — Other Ambulatory Visit: Payer: Self-pay | Admitting: Nurse Practitioner

## 2022-12-06 ENCOUNTER — Ambulatory Visit: Payer: No Typology Code available for payment source | Admitting: Nurse Practitioner

## 2022-12-06 ENCOUNTER — Other Ambulatory Visit: Payer: No Typology Code available for payment source

## 2022-12-06 ENCOUNTER — Ambulatory Visit: Payer: No Typology Code available for payment source

## 2022-12-06 VITALS — BP 117/74 | HR 91 | Temp 98.0°F | Resp 18

## 2022-12-06 DIAGNOSIS — C3490 Malignant neoplasm of unspecified part of unspecified bronchus or lung: Secondary | ICD-10-CM

## 2022-12-06 DIAGNOSIS — Z95828 Presence of other vascular implants and grafts: Secondary | ICD-10-CM

## 2022-12-06 DIAGNOSIS — R197 Diarrhea, unspecified: Secondary | ICD-10-CM

## 2022-12-06 DIAGNOSIS — C342 Malignant neoplasm of middle lobe, bronchus or lung: Secondary | ICD-10-CM

## 2022-12-06 LAB — CBC WITH DIFFERENTIAL (CANCER CENTER ONLY)
Abs Immature Granulocytes: 0.01 10*3/uL (ref 0.00–0.07)
Basophils Absolute: 0 10*3/uL (ref 0.0–0.1)
Basophils Relative: 1 %
Eosinophils Absolute: 0 10*3/uL (ref 0.0–0.5)
Eosinophils Relative: 3 %
HCT: 34.9 % — ABNORMAL LOW (ref 36.0–46.0)
Hemoglobin: 11.2 g/dL — ABNORMAL LOW (ref 12.0–15.0)
Immature Granulocytes: 1 %
Lymphocytes Relative: 57 %
Lymphs Abs: 0.9 10*3/uL (ref 0.7–4.0)
MCH: 29.6 pg (ref 26.0–34.0)
MCHC: 32.1 g/dL (ref 30.0–36.0)
MCV: 92.3 fL (ref 80.0–100.0)
Monocytes Absolute: 0 10*3/uL — ABNORMAL LOW (ref 0.1–1.0)
Monocytes Relative: 1 %
Neutro Abs: 0.6 10*3/uL — ABNORMAL LOW (ref 1.7–7.7)
Neutrophils Relative %: 37 %
Platelet Count: 128 10*3/uL — ABNORMAL LOW (ref 150–400)
RBC: 3.78 MIL/uL — ABNORMAL LOW (ref 3.87–5.11)
RDW: 14.9 % (ref 11.5–15.5)
WBC Count: 1.6 10*3/uL — ABNORMAL LOW (ref 4.0–10.5)
nRBC: 0 % (ref 0.0–0.2)

## 2022-12-06 LAB — BASIC METABOLIC PANEL - CANCER CENTER ONLY
Anion gap: 8 (ref 5–15)
BUN: 25 mg/dL — ABNORMAL HIGH (ref 8–23)
CO2: 26 mmol/L (ref 22–32)
Calcium: 7.3 mg/dL — ABNORMAL LOW (ref 8.9–10.3)
Chloride: 110 mmol/L (ref 98–111)
Creatinine: 1.47 mg/dL — ABNORMAL HIGH (ref 0.44–1.00)
GFR, Estimated: 37 mL/min — ABNORMAL LOW (ref >60)
Glucose, Bld: 106 mg/dL — ABNORMAL HIGH (ref 70–99)
Potassium: 3.1 mmol/L — ABNORMAL LOW (ref 3.5–5.1)
Sodium: 144 mmol/L (ref 135–145)

## 2022-12-06 LAB — MAGNESIUM: Magnesium: 1.3 mg/dL — ABNORMAL LOW (ref 1.7–2.4)

## 2022-12-06 MED ORDER — SODIUM CHLORIDE 0.9% FLUSH
10.0000 mL | INTRAVENOUS | Status: AC | PRN
  Administered 2022-12-06: 10 mL

## 2022-12-06 MED ORDER — POTASSIUM CHLORIDE CRYS ER 20 MEQ PO TBCR
20.0000 meq | EXTENDED_RELEASE_TABLET | Freq: Once | ORAL | Status: AC
  Administered 2022-12-06: 20 meq via ORAL
  Filled 2022-12-06: qty 1

## 2022-12-06 MED ORDER — MAGNESIUM SULFATE 4 GM/100ML IV SOLN
4.0000 g | Freq: Once | INTRAVENOUS | Status: AC
  Administered 2022-12-06: 4 g via INTRAVENOUS
  Filled 2022-12-06: qty 100

## 2022-12-06 MED ORDER — SODIUM CHLORIDE 0.9 % IV SOLN: INTRAVENOUS | Status: DC

## 2022-12-06 MED ORDER — SODIUM CHLORIDE 0.9 % IV SOLN: INTRAVENOUS | Status: AC

## 2022-12-06 MED ORDER — HEPARIN SOD (PORK) LOCK FLUSH 100 UNIT/ML IV SOLN
500.0000 [IU] | INTRAVENOUS | Status: AC | PRN
  Administered 2022-12-06: 500 [IU]

## 2022-12-06 NOTE — Patient Instructions (Addendum)
Rehydration, Older Adult  Rehydration is the replacement of fluids, salts, and minerals in the body (electrolytes) that are lost during dehydration. Dehydration is when there is not enough water or other fluids in the body. This happens when you lose more fluids than you take in. People who are age 78 or older have a higher risk of dehydration than younger adults. This is because in older age, the body: Is less able to maintain the right amount of water. Does not respond to temperature changes as well. Does not get a sense of thirst as easily or quickly. Other causes include: Not drinking enough fluids. This can occur when you are ill, when you forget to drink, or when you are doing activities that require a lot of energy, especially in hot weather. Conditions that cause loss of water or other fluids. These include diarrhea, vomiting, sweating, or urinating a lot. Other illnesses, such as fever or infection. Certain medicines, such as those that remove excess fluid from the body (diuretics). Symptoms of mild or moderate dehydration may include thirst, dry lips and mouth, and dizziness. Symptoms of severe dehydration may include increased heart rate, confusion, fainting, and not urinating. In severe cases, you may need to get fluids through an IV at the hospital. For mild or moderate cases, you can usually rehydrate at home by drinking certain fluids as told by your health care provider. What are the risks? Rehydration is usually safe. Taking in too much fluid (overhydration) can be a problem but is rare. Overhydration can cause an imbalance of electrolytes in the body, kidney failure, fluid in the lungs, or a decrease in salt (sodium) levels in the body. Supplies needed: You will need an oral rehydration solution (ORS) if your health care provider tells you to use one. This is a drink to treat dehydration. It can be found in pharmacies and retail stores. How to rehydrate Fluids Follow  instructions from your health care provider about what to drink. The kind of fluid and the amount you should drink depend on your condition. In general, you should choose drinks that you prefer. If told by your health care provider, drink an ORS. Make an ORS by following instructions on the package. Start by drinking small amounts, about  cup (120 mL) every 5-10 minutes. Slowly increase how much you drink until you have taken in the amount recommended by your health care provider. Drink enough clear fluids to keep your urine pale yellow. If you were told to drink an ORS, finish it first, then start slowly drinking other clear fluids. Drink fluids such as: Water. This includes sparkling and flavored water. Drinking only water can lead to having too little sodium in your body (hyponatremia). Follow the advice of your health care provider. Water from ice chips you suck on. Fruit juice with water added to it(diluted). Sports drinks. Hot or cold herbal teas. Broth-based soups. Coffee. Milk or milk products. Food Follow instructions from your health care provider about what to eat while you rehydrate. Your health care provider may recommend that you slowly begin eating regular foods in small amounts. Eat foods that contain a healthy balance of electrolytes, such as bananas, oranges, potatoes, tomatoes, and spinach. Avoid foods that are greasy or contain a lot of sugar. In some cases, you may get nutrition through a feeding tube that is passed through your nose and into your stomach (nasogastric tube, or NG tube). This may be done if you have uncontrolled vomiting or diarrhea. Drinks to avoid  Certain drinks may make dehydration worse. While you rehydrate, avoid drinking alcohol. How to tell if you are recovering from dehydration You may be getting better if: You are urinating more often than before you started rehydrating. Your urine is pale yellow. Your energy level improves. You vomit less  often. You have diarrhea less often. Your appetite improves or returns to normal. You feel less dizzy or light-headed. Your skin tone and color start to look more normal. Follow these instructions at home: Take over-the-counter and prescription medicines only as told by your health care provider. Do not take sodium tablets. Doing this can lead to having too much sodium in your body (hypernatremia). Contact a health care provider if: You continue to have symptoms of mild or moderate dehydration, such as: Thirst. Dry lips. Slightly dry mouth. Dizziness. Dark urine or less urine than usual. Muscle cramps. You continue to vomit or have diarrhea. Get help right away if: You have symptoms of dehydration that get worse. You have a fever. You have a severe headache. You have been vomiting and have problems, such as: Your vomiting gets worse. Your vomit includes blood or green matter (bile). You cannot eat or drink without vomiting. You have problems with urination or bowel movements, such as: Diarrhea that gets worse. Blood in your stool (feces). This may cause stool to look black and tarry. Not urinating, or urinating only a small amount of very dark urine, within 6-8 hours. You have trouble breathing. You have symptoms that get worse with treatment. These symptoms may be an emergency. Get help right away. Call 911. Do not wait to see if the symptoms will go away. Do not drive yourself to the hospital. This information is not intended to replace advice given to you by your health care provider. Make sure you discuss any questions you have with your health care provider. Document Revised: 02/22/2022 Document Reviewed: 02/20/2022 Elsevier Patient Education  [place].  Magnesium Sulfate Injection What is this medication? MAGNESIUM SULFATE (mag NEE zee um SUL fate) prevents and treats low levels of magnesium in your body. It may also be used to prevent and treat seizures during  pregnancy in people with high blood pressure disorders, such as preeclampsia or eclampsia. Magnesium plays an important role in maintaining the health of your muscles and nervous system. This medicine may be used for other purposes; ask your health care provider or pharmacist if you have questions. What should I tell my care team before I take this medication? They need to know if you have any of these conditions: Heart disease History of irregular heart beat Kidney disease An unusual or allergic reaction to magnesium sulfate, medications, foods, dyes, or preservatives Pregnant or trying to get pregnant Breast-feeding How should I use this medication? This medication is for infusion into a vein. It is given in a hospital or clinic setting. Talk to your care team about the use of this medication in children. While this medication may be prescribed for selected conditions, precautions do apply. Overdosage: If you think you have taken too much of this medicine contact a poison control center or emergency room at once. NOTE: This medicine is only for you. Do not share this medicine with others. What if I miss a dose? This does not apply. What may interact with this medication? Certain medications for anxiety or sleep Certain medications for seizures, such phenobarbital Digoxin Medications that relax muscles for surgery Narcotic medications for pain This list may not describe all possible  interactions. Give your health care provider a list of all the medicines, herbs, non-prescription drugs, or dietary supplements you use. Also tell them if you smoke, drink alcohol, or use illegal drugs. Some items may interact with your medicine. What should I watch for while using this medication? Your condition will be monitored carefully while you are receiving this medication. You may need blood work done while you are receiving this medication. What side effects may I notice from receiving this  medication? Side effects that you should report to your care team as soon as possible: Allergic reactions--skin rash, itching, hives, swelling of the face, lips, tongue, or throat High magnesium level--confusion, drowsiness, facial flushing, redness, sweating, muscle weakness, fast or irregular heartbeat, trouble breathing Low blood pressure--dizziness, feeling faint or lightheaded, blurry vision Side effects that usually do not require medical attention (report to your care team if they continue or are bothersome): Headache Nausea This list may not describe all possible side effects. Call your doctor for medical advice about side effects. You may report side effects to FDA at 1-800-FDA-1088. Where should I keep my medication? This medication is given in a hospital or clinic and will not be stored at home. NOTE: This sheet is a summary. It may not cover all possible information. If you have questions about this medicine, talk to your doctor, pharmacist, or health care provider.  2023 Elsevier/Gold Standard (2013-02-14 00:00:00)

## 2022-12-08 ENCOUNTER — Telehealth: Payer: Self-pay

## 2022-12-08 ENCOUNTER — Inpatient Hospital Stay: Payer: No Typology Code available for payment source | Admitting: Nurse Practitioner

## 2022-12-08 ENCOUNTER — Encounter (HOSPITAL_COMMUNITY): Payer: Self-pay | Admitting: Internal Medicine

## 2022-12-08 ENCOUNTER — Inpatient Hospital Stay: Payer: No Typology Code available for payment source

## 2022-12-08 ENCOUNTER — Other Ambulatory Visit: Payer: Self-pay

## 2022-12-08 ENCOUNTER — Inpatient Hospital Stay (HOSPITAL_COMMUNITY)
Admission: AD | Admit: 2022-12-08 | Discharge: 2022-12-18 | DRG: 180 | Disposition: A | Discharge status: Home / Self Care | Payer: No Typology Code available for payment source | Source: Ambulatory Visit | Attending: Internal Medicine | Admitting: Internal Medicine

## 2022-12-08 ENCOUNTER — Encounter (HOSPITAL_COMMUNITY): Payer: Self-pay

## 2022-12-08 ENCOUNTER — Encounter: Payer: Self-pay | Admitting: Nurse Practitioner

## 2022-12-08 VITALS — BP 133/81 | HR 97 | Temp 98.1°F | Resp 20 | Ht 65.0 in | Wt 211.0 lb

## 2022-12-08 VITALS — Temp 97.7°F | Resp 18

## 2022-12-08 DIAGNOSIS — T451X5A Adverse effect of antineoplastic and immunosuppressive drugs, initial encounter: Principal | ICD-10-CM

## 2022-12-08 DIAGNOSIS — C3490 Malignant neoplasm of unspecified part of unspecified bronchus or lung: Secondary | ICD-10-CM

## 2022-12-08 DIAGNOSIS — Z6835 Body mass index (BMI) 35.0-35.9, adult: Secondary | ICD-10-CM

## 2022-12-08 DIAGNOSIS — C7951 Secondary malignant neoplasm of bone: Secondary | ICD-10-CM | POA: Diagnosis not present

## 2022-12-08 DIAGNOSIS — K1230 Oral mucositis (ulcerative), unspecified: Secondary | ICD-10-CM | POA: Diagnosis present

## 2022-12-08 DIAGNOSIS — I428 Other cardiomyopathies: Secondary | ICD-10-CM | POA: Diagnosis not present

## 2022-12-08 DIAGNOSIS — Z7901 Long term (current) use of anticoagulants: Secondary | ICD-10-CM | POA: Diagnosis not present

## 2022-12-08 DIAGNOSIS — D61810 Antineoplastic chemotherapy induced pancytopenia: Secondary | ICD-10-CM | POA: Diagnosis not present

## 2022-12-08 DIAGNOSIS — K921 Melena: Secondary | ICD-10-CM | POA: Diagnosis not present

## 2022-12-08 DIAGNOSIS — E876 Hypokalemia: Secondary | ICD-10-CM | POA: Diagnosis not present

## 2022-12-08 DIAGNOSIS — J90 Pleural effusion, not elsewhere classified: Secondary | ICD-10-CM | POA: Diagnosis not present

## 2022-12-08 DIAGNOSIS — C342 Malignant neoplasm of middle lobe, bronchus or lung: Secondary | ICD-10-CM

## 2022-12-08 DIAGNOSIS — K589 Irritable bowel syndrome without diarrhea: Secondary | ICD-10-CM | POA: Diagnosis present

## 2022-12-08 DIAGNOSIS — D701 Agranulocytosis secondary to cancer chemotherapy: Secondary | ICD-10-CM | POA: Diagnosis not present

## 2022-12-08 DIAGNOSIS — D709 Neutropenia, unspecified: Secondary | ICD-10-CM | POA: Diagnosis present

## 2022-12-08 DIAGNOSIS — F419 Anxiety disorder, unspecified: Secondary | ICD-10-CM | POA: Diagnosis present

## 2022-12-08 DIAGNOSIS — I130 Hypertensive heart and chronic kidney disease with heart failure and stage 1 through stage 4 chronic kidney disease, or unspecified chronic kidney disease: Secondary | ICD-10-CM | POA: Diagnosis present

## 2022-12-08 DIAGNOSIS — Z923 Personal history of irradiation: Secondary | ICD-10-CM

## 2022-12-08 DIAGNOSIS — R21 Rash and other nonspecific skin eruption: Secondary | ICD-10-CM | POA: Diagnosis not present

## 2022-12-08 DIAGNOSIS — J910 Malignant pleural effusion: Secondary | ICD-10-CM | POA: Diagnosis not present

## 2022-12-08 DIAGNOSIS — N1831 Chronic kidney disease, stage 3a: Secondary | ICD-10-CM | POA: Diagnosis not present

## 2022-12-08 DIAGNOSIS — Z832 Family history of diseases of the blood and blood-forming organs and certain disorders involving the immune mechanism: Secondary | ICD-10-CM | POA: Diagnosis not present

## 2022-12-08 DIAGNOSIS — Z823 Family history of stroke: Secondary | ICD-10-CM

## 2022-12-08 DIAGNOSIS — Z8572 Personal history of non-Hodgkin lymphomas: Secondary | ICD-10-CM

## 2022-12-08 DIAGNOSIS — Z66 Do not resuscitate: Secondary | ICD-10-CM | POA: Diagnosis not present

## 2022-12-08 DIAGNOSIS — Z853 Personal history of malignant neoplasm of breast: Secondary | ICD-10-CM

## 2022-12-08 DIAGNOSIS — Z79899 Other long term (current) drug therapy: Secondary | ICD-10-CM

## 2022-12-08 DIAGNOSIS — Z86711 Personal history of pulmonary embolism: Secondary | ICD-10-CM

## 2022-12-08 DIAGNOSIS — R197 Diarrhea, unspecified: Secondary | ICD-10-CM | POA: Diagnosis not present

## 2022-12-08 DIAGNOSIS — I10 Essential (primary) hypertension: Secondary | ICD-10-CM | POA: Diagnosis present

## 2022-12-08 DIAGNOSIS — D696 Thrombocytopenia, unspecified: Secondary | ICD-10-CM | POA: Diagnosis not present

## 2022-12-08 DIAGNOSIS — E7800 Pure hypercholesterolemia, unspecified: Secondary | ICD-10-CM | POA: Diagnosis present

## 2022-12-08 DIAGNOSIS — C3491 Malignant neoplasm of unspecified part of right bronchus or lung: Secondary | ICD-10-CM | POA: Diagnosis not present

## 2022-12-08 DIAGNOSIS — I5022 Chronic systolic (congestive) heart failure: Secondary | ICD-10-CM | POA: Diagnosis present

## 2022-12-08 DIAGNOSIS — J9811 Atelectasis: Secondary | ICD-10-CM | POA: Diagnosis not present

## 2022-12-08 DIAGNOSIS — Z841 Family history of disorders of kidney and ureter: Secondary | ICD-10-CM

## 2022-12-08 DIAGNOSIS — E785 Hyperlipidemia, unspecified: Secondary | ICD-10-CM | POA: Diagnosis present

## 2022-12-08 DIAGNOSIS — L0390 Cellulitis, unspecified: Secondary | ICD-10-CM | POA: Diagnosis present

## 2022-12-08 DIAGNOSIS — Z8249 Family history of ischemic heart disease and other diseases of the circulatory system: Secondary | ICD-10-CM | POA: Diagnosis not present

## 2022-12-08 DIAGNOSIS — K219 Gastro-esophageal reflux disease without esophagitis: Secondary | ICD-10-CM | POA: Diagnosis present

## 2022-12-08 DIAGNOSIS — N183 Chronic kidney disease, stage 3 unspecified: Secondary | ICD-10-CM | POA: Diagnosis present

## 2022-12-08 DIAGNOSIS — I5032 Chronic diastolic (congestive) heart failure: Secondary | ICD-10-CM | POA: Diagnosis present

## 2022-12-08 DIAGNOSIS — Z888 Allergy status to other drugs, medicaments and biological substances status: Secondary | ICD-10-CM

## 2022-12-08 LAB — CBC WITH DIFFERENTIAL (CANCER CENTER ONLY)
Abs Immature Granulocytes: 0 10*3/uL (ref 0.00–0.07)
Basophils Absolute: 0 10*3/uL (ref 0.0–0.1)
Basophils Relative: 0 %
Eosinophils Absolute: 0 10*3/uL (ref 0.0–0.5)
Eosinophils Relative: 3 %
HCT: 32.1 % — ABNORMAL LOW (ref 36.0–46.0)
Hemoglobin: 10.2 g/dL — ABNORMAL LOW (ref 12.0–15.0)
Immature Granulocytes: 0 %
Lymphocytes Relative: 66 %
Lymphs Abs: 0.5 10*3/uL — ABNORMAL LOW (ref 0.7–4.0)
MCH: 29.5 pg (ref 26.0–34.0)
MCHC: 31.8 g/dL (ref 30.0–36.0)
MCV: 92.8 fL (ref 80.0–100.0)
Monocytes Absolute: 0 10*3/uL — ABNORMAL LOW (ref 0.1–1.0)
Monocytes Relative: 1 %
Neutro Abs: 0.2 10*3/uL — CL (ref 1.7–7.7)
Neutrophils Relative %: 30 %
Platelet Count: 60 10*3/uL — ABNORMAL LOW (ref 150–400)
RBC: 3.46 MIL/uL — ABNORMAL LOW (ref 3.87–5.11)
RDW: 14.6 % (ref 11.5–15.5)
WBC Count: 0.7 10*3/uL — CL (ref 4.0–10.5)
nRBC: 0 % (ref 0.0–0.2)

## 2022-12-08 LAB — CMP (CANCER CENTER ONLY)
ALT: 9 U/L (ref 0–44)
AST: 15 U/L (ref 15–41)
Albumin: 2.9 g/dL — ABNORMAL LOW (ref 3.5–5.0)
Alkaline Phosphatase: 43 U/L (ref 38–126)
Anion gap: 7 (ref 5–15)
BUN: 23 mg/dL (ref 8–23)
CO2: 28 mmol/L (ref 22–32)
Calcium: 8 mg/dL — ABNORMAL LOW (ref 8.9–10.3)
Chloride: 108 mmol/L (ref 98–111)
Creatinine: 1.55 mg/dL — ABNORMAL HIGH (ref 0.44–1.00)
GFR, Estimated: 34 mL/min — ABNORMAL LOW (ref >60)
Glucose, Bld: 129 mg/dL — ABNORMAL HIGH (ref 70–99)
Potassium: 3.5 mmol/L (ref 3.5–5.1)
Sodium: 143 mmol/L (ref 135–145)
Total Bilirubin: 0.6 mg/dL (ref 0.3–1.2)
Total Protein: 5.8 g/dL — ABNORMAL LOW (ref 6.5–8.1)

## 2022-12-08 LAB — LACTIC ACID, PLASMA
Lactic Acid, Venous: 1.2 mmol/L (ref 0.5–1.9)
Lactic Acid, Venous: 0.8 mmol/L (ref 0.5–1.9)

## 2022-12-08 LAB — MAGNESIUM: Magnesium: 1.8 mg/dL (ref 1.7–2.4)

## 2022-12-08 MED ORDER — LORAZEPAM 0.5 MG PO TABS
0.5000 mg | ORAL_TABLET | Freq: Two times a day (BID) | ORAL | Status: DC | PRN
  Administered 2022-12-10 – 2022-12-17 (×5): 0.5 mg via ORAL
  Filled 2022-12-08 (×5): qty 1

## 2022-12-08 MED ORDER — METOPROLOL TARTRATE 50 MG PO TABS
100.0000 mg | ORAL_TABLET | Freq: Two times a day (BID) | ORAL | Status: DC
  Administered 2022-12-08 – 2022-12-18 (×20): 100 mg via ORAL
  Filled 2022-12-08 (×21): qty 2

## 2022-12-08 MED ORDER — TRAZODONE HCL 50 MG PO TABS
25.0000 mg | ORAL_TABLET | Freq: Every evening | ORAL | Status: DC | PRN
  Administered 2022-12-08 – 2022-12-12 (×2): 25 mg via ORAL
  Filled 2022-12-08 (×2): qty 1

## 2022-12-08 MED ORDER — SODIUM CHLORIDE 0.9 % IV SOLN: INTRAVENOUS | Status: DC

## 2022-12-08 MED ORDER — APIXABAN 5 MG PO TABS
5.0000 mg | ORAL_TABLET | Freq: Two times a day (BID) | ORAL | Status: DC
  Administered 2022-12-08 – 2022-12-09 (×3): 5 mg via ORAL
  Filled 2022-12-08 (×3): qty 1

## 2022-12-08 MED ORDER — DOCUSATE SODIUM 100 MG PO CAPS
100.0000 mg | ORAL_CAPSULE | Freq: Two times a day (BID) | ORAL | Status: DC
  Administered 2022-12-08 – 2022-12-15 (×14): 100 mg via ORAL
  Filled 2022-12-08 (×15): qty 1

## 2022-12-08 MED ORDER — TBO-FILGRASTIM 480 MCG/0.8ML ~~LOC~~ SOSY
480.0000 ug | PREFILLED_SYRINGE | Freq: Every day | SUBCUTANEOUS | Status: DC
  Administered 2022-12-08 – 2022-12-11 (×4): 480 ug via SUBCUTANEOUS
  Filled 2022-12-08 (×4): qty 0.8

## 2022-12-08 MED ORDER — POLYETHYLENE GLYCOL 3350 17 G PO PACK: 17.0000 g | PACK | Freq: Every day | ORAL | Status: DC | PRN

## 2022-12-08 MED ORDER — SODIUM CHLORIDE 0.9 % IV SOLN
2.0000 g | INTRAVENOUS | Status: AC
  Administered 2022-12-08 – 2022-12-14 (×7): 2 g via INTRAVENOUS
  Filled 2022-12-08 (×7): qty 20

## 2022-12-08 MED ORDER — FOLIC ACID 1 MG PO TABS
1.0000 mg | ORAL_TABLET | Freq: Every day | ORAL | Status: DC
  Administered 2022-12-09 – 2022-12-18 (×10): 1 mg via ORAL
  Filled 2022-12-08 (×10): qty 1

## 2022-12-08 MED ORDER — MAGIC MOUTHWASH W/LIDOCAINE
5.0000 mL | Freq: Four times a day (QID) | ORAL | Status: DC | PRN
  Administered 2022-12-08 – 2022-12-12 (×7): 5 mL via ORAL
  Filled 2022-12-08 (×11): qty 5

## 2022-12-08 MED ORDER — ONDANSETRON HCL 4 MG PO TABS
4.0000 mg | ORAL_TABLET | Freq: Four times a day (QID) | ORAL | Status: DC | PRN
  Administered 2022-12-15: 4 mg via ORAL
  Filled 2022-12-08: qty 1

## 2022-12-08 MED ORDER — NYSTATIN 100000 UNIT/GM EX POWD
1.0000 | Freq: Three times a day (TID) | CUTANEOUS | Status: DC
  Administered 2022-12-08 – 2022-12-18 (×30): 1 via TOPICAL
  Filled 2022-12-08 (×2): qty 15

## 2022-12-08 MED ORDER — OXYCODONE HCL 5 MG PO TABS
5.0000 mg | ORAL_TABLET | Freq: Four times a day (QID) | ORAL | Status: DC | PRN
  Administered 2022-12-09 – 2022-12-13 (×9): 5 mg via ORAL
  Filled 2022-12-08 (×9): qty 1

## 2022-12-08 MED ORDER — ONDANSETRON HCL 4 MG/2ML IJ SOLN
4.0000 mg | Freq: Four times a day (QID) | INTRAMUSCULAR | Status: DC | PRN
  Administered 2022-12-16: 4 mg via INTRAVENOUS
  Filled 2022-12-08: qty 2

## 2022-12-08 MED ORDER — HYDROCORTISONE 0.5 % EX CREA
TOPICAL_CREAM | CUTANEOUS | Status: DC | PRN
  Filled 2022-12-08: qty 28.35

## 2022-12-08 MED ORDER — VANCOMYCIN HCL 1250 MG/250ML IV SOLN
1250.0000 mg | INTRAVENOUS | Status: DC
  Administered 2022-12-08: 1250 mg via INTRAVENOUS
  Filled 2022-12-08 (×2): qty 250

## 2022-12-08 MED ORDER — FLUCONAZOLE 100 MG PO TABS
100.0000 mg | ORAL_TABLET | Freq: Every day | ORAL | Status: AC
  Administered 2022-12-09 – 2022-12-10 (×2): 100 mg via ORAL
  Filled 2022-12-08 (×2): qty 1

## 2022-12-08 MED ORDER — POTASSIUM CHLORIDE IN NACL 20-0.9 MEQ/L-% IV SOLN
INTRAVENOUS | Status: DC
  Filled 2022-12-08 (×4): qty 1000
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
## 2022-12-08 NOTE — TOC Progression Note (Signed)
Transition of Care [facility]) - Progression Note    Patient Details  Name: SANTANNA WHITFORD MRN: [id number] Date of Birth: 1946-10-14  Transition of Care [facility]) CM/SW [place], RN Phone Number:[phone]  12/08/2022, 4:14 PM  Clinical Narrative:     Transition of Care [facility]) Screening Note   Patient Details  Name: CORREEN BUBOLZ Date of Birth: 03-Feb-1946   Transition of Care [facility]) CM/SW Contact:    Angelita Ingles, RN Phone Number: 12/08/2022, 4:14 PM    Transition of Care Department [facility]) has reviewed patient and no TOC needs have been identified at this time. We will continue to monitor patient advancement through interdisciplinary progression rounds. If new patient transition needs arise, please place a TOC consult.          Expected Discharge Plan and Services                                               Social Determinants of Health (SDOH) Interventions SDOH Screenings   Food Insecurity: No Food Insecurity (12/08/2022)  Housing: Low Risk  (12/08/2022)  Transportation Needs: No Transportation Needs (12/08/2022)  Utilities: Not At Risk (12/08/2022)  Tobacco Use: Low Risk  (12/08/2022)    Readmission Risk Interventions     No data to display

## 2022-12-08 NOTE — Patient Instructions (Signed)

## 2022-12-08 NOTE — Progress Notes (Signed)
CRITICAL VALUE STICKER  CRITICAL VALUE:WBC 0.7 and ANC 0.2  RECEIVER (on-site recipient of call):Ronalda Walpole,RN  DATE & TIME NOTIFIED: 12/08/22 @ 0930  MESSENGER (representative from lab):Simona Huh  MD NOTIFIED: Ned Card, NP  TIME OF NOTIFICATION:0931  RESPONSE:  Currently being seen by provider.

## 2022-12-08 NOTE — H&P (Signed)
History and Physical  Crystal [name] YIF:[id number] DOB: [birth year] DOA: 12/08/2022   PCP: Gaynelle Arabian, MD   Patient coming from: Home via [facility]   Chief Complaint: Skin breakdown   HPI: Crystal [name] is a 77 y.o. female with medical history significant for pulmonary embolism on Eliquis, non-small cell lung cancer who just completed cycle 1 of carboplatin/Alimta/Pembrolizumab 11/29/2022 under the care of Dr. Benay Spice and is being admitted to the hospital with neutropenia and skin breakdown.  She denies any fevers, or chills.  Unfortunately on 12/03/2022 she started to develop sores and diarrhea.  She is able to tolerate fluids, but due to pain and alteration in taste unable to take in much by mouth.  He is having 4+ watery bowel movements a day.  She also developed a rash in the lower abdomen, which looks significantly worse today, with increasing erythema, and some bloody drainage intermittently.  As such, I was contacted by her oncologist this morning for direct admission.  Dr. Benay Spice feels her skin changes are likely a rare but not unheard of reaction to her chemotherapy.  He recommends supportive care with IV fluids, and empiric IV antibiotics given her neutropenic status.  Review of Systems: Please see HPI for pertinent positives and negatives. A complete 10 system review of systems are otherwise negative.  Past Medical History:  Diagnosis Date   Anxiety    Atherosclerosis of aorta ([place])    Breast cancer ([place]) 1998   (Rt) lumpectomy dx 1999; Dr. Benay Spice   Dyslipidemia, goal LDL below 130    Essential hypertension    GERD (gastroesophageal reflux disease)    Hypercholesterolemia    IBS (irritable bowel syndrome)    Lymphoma ([place])    lymphoma dx 11/28/10 - left neck   non hodgkins lymphoma 12/2010   right aprotid gland   Nonischemic cardiomyopathy ([place]) 2008   ? Doxorubincin induced; essentially resolved as of echo in January 2014, current EF 50-55%. Grade 1 diastolic  dysfunction.   Obesity    Severe obesity (BMI >= 40) ([place]) 05/21/2013   Improve to BMI of 39 by July 2015   Tremor    Past Surgical History:  Procedure Laterality Date   ABDOMINAL HYSTERECTOMY     BSO   ANKLE FRACTURE SURGERY Left    BREAST EXCISIONAL BIOPSY Left    BREAST EXCISIONAL BIOPSY Right    BREAST LUMPECTOMY Right 1998   BREAST LUMPECTOMY Right 08/25/2020   Procedure: RIGHT BREAST LUMPECTOMY;  Surgeon: Coralie Keens, MD;  Location: [facility];  Service: General;  Laterality: Right;   CHEST TUBE INSERTION Left 11/16/2022   Procedure: INSERTION PLEURAL DRAINAGE CATHETER;  Surgeon: Freddi Starr, MD;  Location: [facility];  Service: Pulmonary;  Laterality: Left;  afternoon scheduling time please   CHOLECYSTECTOMY N/A 06/09/2020   Procedure: LAPAROSCOPIC CHOLECYSTECTOMY;  Surgeon: Coralie Keens, MD;  Location: [facility];  Service: General;  Laterality: N/A;   COLONOSCOPY     IR BONE TUMOR(S)RF ABLATION  11/06/2022   IR IMAGING GUIDED PORT INSERTION  11/15/2022   IR KYPHO LUMBAR INC FX REDUCE BONE BX UNI/BIL CANNULATION INC/IMAGING  11/06/2022   IR RADIOLOGIST EVAL & MGMT  10/26/2022   LEFT HEART CATH AND CORONARY ANGIOGRAPHY  12/2007   None coronary disease, EF 45% (up from nuclear study EF of 30% in June '08)   [place]  03/29/2007   EF 33%,NEGATIVE ISCHEMIA, prob need cath   NM MYOVIEW LTD  05/01/2014   Lexiscan:  EF 55%. Normal wall motion; no ischemia or infarction; apical thinning   PORTACATH PLACEMENT  08/25/2013   rt. with tip in cavoatrial junction, Dr.Yamagata    TRANSTHORACIC ECHOCARDIOGRAM  05/01/2014   Normal LV size with low normal function. EF of 50-55% and Gr 1 DD; aortic sclerosis without stenosis. MAC and thickening/calcification of the anterior leaflet - no notable AI / AS or MR/MS    Social History:  reports that she has never smoked. She has never used smokeless tobacco. She reports current alcohol use. She reports that she does  not use drugs.   Allergies  Allergen Reactions   Simvastatin     Leg cramps   Zoloft [Sertraline] Nausea Only   Codeine Other (See Comments)    Bad Headaches   Coreg Other (See Comments)    "Made my legs hurt"   Lisinopril Cough   Tizanidine Hcl Rash    hypotension    Family History  Problem Relation Age of Onset   Heart Problems Mother        CABG   Chronic Renal Failure Mother    Heart Problems Father    Hypertension Sister    Cancer Sister        bladder   Migraines Sister    Heart attack Sister    Hypertension Sister        x2   Lupus Brother    Hypertension Brother        and lupus   Heart Problems Maternal Grandmother    Stroke Maternal Grandfather    Cancer Paternal Grandmother        stomach   Heart Problems Paternal Grandfather      Prior to Admission medications   Medication Sig Start Date End Date Taking? Authorizing Provider  acetaminophen (TYLENOL) 325 MG tablet Take 2 tablets (650 mg total) by mouth every 6 (six) hours as needed for mild pain or moderate pain. 09/14/22   Arrien, Jimmy Picket, MD  apixaban (ELIQUIS) 5 MG TABS tablet Take 1 tablet (5 mg total) by mouth 2 (two) times daily. 12/04/22   Owens Shark, NP  fluconazole (DIFLUCAN) 100 MG tablet Take 1 tablet (100 mg total) by mouth daily for 5 days. 12/05/22 12/10/22  Owens Shark, NP  folic acid (FOLVITE) 1 MG tablet Take 1 tablet (1 mg total) by mouth daily. 11/29/22   Owens Shark, NP  lidocaine-prilocaine (EMLA) cream Apply 1 Application topically as needed (Apply to port site an hour before port to be accessed). 11/27/22   Ladell Pier, MD  LORazepam (ATIVAN) 0.5 MG tablet Take 1 tablet (0.5 mg total) by mouth 2 (two) times daily as needed. 10/31/22   Ladell Pier, MD  magic mouthwash (nystatin, diphenhydrAMINE, alum & mag hydroxide) suspension mixture Swish 5 mL by mouth for 1 minute then spit; may use up to four times daily. 12/05/22   Owens Shark, NP  magnesium oxide (MAG-OX)  400 (240 Mg) MG tablet Take 1 tablet (400 mg total) by mouth daily. 12/05/22   Owens Shark, NP  metoprolol tartrate (LOPRESSOR) 100 MG tablet Take 1 tablet (100 mg total) by mouth 2 (two) times daily. 11/17/22 12/17/22  Donne Hazel, MD  nystatin (MYCOSTATIN/NYSTOP) powder Apply 1 Application topically 3 (three) times daily. 12/05/22   Owens Shark, NP  ondansetron (ZOFRAN) 8 MG tablet Take 1 tablet (8 mg total) by mouth every 8 (eight) hours as needed for nausea or vomiting (starting using day  3 after treatment as needed for nausea/vomiting). 11/27/22   Ladell Pier, MD  oxyCODONE (OXY IR/ROXICODONE) 5 MG immediate release tablet Take 1 tablet (5 mg total) by mouth every 6 (six) hours as needed for severe pain. 11/23/22   Ladell Pier, MD  potassium chloride (KLOR-CON M) 10 MEQ tablet Take 1 tablet (10 mEq total) by mouth 2 (two) times daily. 12/05/22   Owens Shark, NP  prochlorperazine (COMPAZINE) 10 MG tablet Take 1 tablet (10 mg total) by mouth every 6 (six) hours as needed for nausea or vomiting. 11/27/22   Ladell Pier, MD    Physical Exam: BP (!) 130/92 (BP Location: Right Arm)   Pulse 94   Temp 98.2 F (36.8 C) (Oral)   Resp 20   SpO2 95%   General:  Alert, oriented, calm, in no acute distress, she is nontoxic in appearance, pleasant and cooperative Eyes: EOMI, clear conjuctivae, white sclerea Neck: supple, no masses, trachea mildline  Cardiovascular: RRR, no murmurs or rubs, no peripheral edema  Respiratory: clear to auscultation bilaterally, no wheezes, no crackles  Abdomen: soft, he is, nontender, nondistended, normal bowel tones heard  Skin: She has oral ulcerations, as well as significant warmth, erythema underneath her abdominal pannus, with small punctate areas of bleeding; no areas of fluctuance, no obvious areas of drainage, and not significantly tender to palpation Musculoskeletal: no joint effusions, normal range of motion  Psychiatric: appropriate affect,  normal speech  Neurologic: extraocular muscles intact, clear speech, moving all extremities with intact sensorium          Labs on Admission:  Basic Metabolic Panel: Recent Labs  Lab 12/05/22 1458 12/06/22 1217 12/08/22 0843  NA 141 144 143  K 3.2* 3.1* 3.5  CL 105 110 108  CO2 29 26 28   GLUCOSE 121* 106* 129*  BUN 25* 25* 23  CREATININE 1.52* 1.47* 1.55*  CALCIUM 7.9* 7.3* 8.0*  MG 1.4* 1.3* 1.8   Liver Function Tests: Recent Labs  Lab 12/05/22 1458 12/08/22 0843  AST 21 15  ALT 12 9  ALKPHOS 51 43  BILITOT 0.7 0.6  PROT 5.9* 5.8*  ALBUMIN 3.0* 2.9*   No results for input(s): "LIPASE", "AMYLASE" in the last 168 hours. No results for input(s): "AMMONIA" in the last 168 hours. CBC: Recent Labs  Lab 12/05/22 1458 12/06/22 1217 12/08/22 0843  WBC 2.1* 1.6* 0.7*  NEUTROABS 1.2* 0.6* 0.2*  HGB 11.4* 11.2* 10.2*  HCT 34.9* 34.9* 32.1*  MCV 91.6 92.3 92.8  PLT 153 128* 60*   Cardiac Enzymes: No results for input(s): "CKTOTAL", "CKMB", "CKMBINDEX", "TROPONINI" in the last 168 hours.  BNP (last 3 results) Recent Labs    11/11/22 2103  BNP 160.0*    ProBNP (last 3 results) No results for input(s): "PROBNP" in the last 8760 hours.  CBG: No results for input(s): "GLUCAP" in the last 168 hours.  Radiological Exams on Admission: No results found.  Assessment/Plan Principal Problem:   Neutropenia ([place]) -the patient is afebrile, hemodynamically stable.  Is neutropenic as a result of recently completed chemotherapy.  She has been admitted to the hospital for management of her neutropenia and worsening skin lesions. -Inpatient admission -IV fluid hydration -Regular diet -Empiric IV Rocephin and IV vancomycin -Check lactate, blood cultures x 2 Active Problems: Sores-also due to recent chemotherapy, continue Magic mouthwash with lidocaine 4 times daily as needed   Essential hypertension -continue home metoprolol   Dyslipidemia, goal LDL below 100    Nonischemic  cardiomyopathy ([place])   Cellulitis -treating with empiric IV antibiotics as above  DVT prophylaxis: Continue home Eliquis for history of PE  Code Status: DNR, confirmed with patient at the time of admission.  Family Communication: None present, patient is awake alert oriented x 4.  Consults called: Discussed with Dr. Benay Spice who will follow in AM.   Admission status: Inpatient   Time spent: 49 minutes  Ilham Roughton Neva Seat MD [facility] Pager [phone]  If 7PM-7AM, please contact night-coverage [email or web address] Password [facility]  12/08/2022, 3:03 PM

## 2022-12-08 NOTE — Telephone Encounter (Signed)
Called 6E to give report: I spoke with [name] reported Crystal [name] has metastatic non-small cell lung cancer.  She completed cycle 1 carboplatin/Alimta/Pembrolizumab 11/29/2022.  She has significant mucositis and an ulcerated rash at the low abdomen/groin.  She is neutropenic and thrombocytopenic.

## 2022-12-08 NOTE — Progress Notes (Signed)
Pharmacy Antibiotic Note  Crystal [name] is a 77 y.o. female admitted on 12/08/2022 with cellulitis.  Pharmacy has been consulted for vancomycin dosing.  Plan: Vancomycin 1250mg  IV q48h for estimated AUC 482 using SCr 1.55, Vd 0.5 Check vancomycin levels at steady state, goal AUC 400-550 Follow up renal function & cultures    Temp (24hrs), Avg:98 F (36.7 C), Min:97.7 F (36.5 C), Max:98.2 F (36.8 C)  Recent Labs  Lab 12/05/22 1458 12/06/22 1217 12/08/22 0843  WBC 2.1* 1.6* 0.7*  CREATININE 1.52* 1.47* 1.55*    Estimated Creatinine Clearance: 34.8 mL/min (A) (by C-G formula based on SCr of 1.55 mg/dL (H)).    Allergies  Allergen Reactions   Simvastatin     Leg cramps   Zoloft [Sertraline] Nausea Only   Codeine Other (See Comments)    Bad Headaches   Coreg Other (See Comments)    "Made my legs hurt"   Lisinopril Cough   Tizanidine Hcl Rash    hypotension    Antimicrobials this admission: 2/16 Vanc >> 2/16 Ceftriaxone >>  Dose adjustments this admission:  Microbiology results:   Thank you for allowing pharmacy to be a part of this patient's care.  Peggyann Juba, PharmD, BCPS Pharmacy: [phone] 12/08/2022 2:48 PM

## 2022-12-09 DIAGNOSIS — T451X5A Adverse effect of antineoplastic and immunosuppressive drugs, initial encounter: Secondary | ICD-10-CM | POA: Diagnosis not present

## 2022-12-09 DIAGNOSIS — D701 Agranulocytosis secondary to cancer chemotherapy: Secondary | ICD-10-CM | POA: Diagnosis not present

## 2022-12-09 LAB — CBC WITH DIFFERENTIAL/PLATELET
Abs Immature Granulocytes: 0 10*3/uL (ref 0.00–0.07)
Basophils Absolute: 0 10*3/uL (ref 0.0–0.1)
Basophils Relative: 0 %
Eosinophils Absolute: 0 10*3/uL (ref 0.0–0.5)
Eosinophils Relative: 0 %
HCT: 29.3 % — ABNORMAL LOW (ref 36.0–46.0)
Hemoglobin: 9 g/dL — ABNORMAL LOW (ref 12.0–15.0)
Immature Granulocytes: 0 %
Lymphocytes Relative: 91 %
Lymphs Abs: 0.7 10*3/uL (ref 0.7–4.0)
MCH: 29.4 pg (ref 26.0–34.0)
MCHC: 30.7 g/dL (ref 30.0–36.0)
MCV: 95.8 fL (ref 80.0–100.0)
Monocytes Absolute: 0 10*3/uL — ABNORMAL LOW (ref 0.1–1.0)
Monocytes Relative: 1 %
Neutro Abs: 0.1 10*3/uL — CL (ref 1.7–7.7)
Neutrophils Relative %: 8 %
Platelets: 32 10*3/uL — ABNORMAL LOW (ref 150–400)
RBC: 3.06 MIL/uL — ABNORMAL LOW (ref 3.87–5.11)
RDW: 14.6 % (ref 11.5–15.5)
WBC: 0.8 10*3/uL — CL (ref 4.0–10.5)
nRBC: 0 % (ref 0.0–0.2)

## 2022-12-09 LAB — BASIC METABOLIC PANEL
Anion gap: 9 (ref 5–15)
BUN: 18 mg/dL (ref 8–23)
CO2: 26 mmol/L (ref 22–32)
Calcium: 7.2 mg/dL — ABNORMAL LOW (ref 8.9–10.3)
Chloride: 109 mmol/L (ref 98–111)
Creatinine, Ser: 1.45 mg/dL — ABNORMAL HIGH (ref 0.44–1.00)
GFR, Estimated: 37 mL/min — ABNORMAL LOW (ref >60)
Glucose, Bld: 103 mg/dL — ABNORMAL HIGH (ref 70–99)
Potassium: 3.3 mmol/L — ABNORMAL LOW (ref 3.5–5.1)
Sodium: 144 mmol/L (ref 135–145)

## 2022-12-09 LAB — MAGNESIUM: Magnesium: 1.5 mg/dL — ABNORMAL LOW (ref 1.7–2.4)

## 2022-12-09 MED ORDER — CHLORHEXIDINE GLUCONATE CLOTH 2 % EX PADS
6.0000 | MEDICATED_PAD | Freq: Every day | CUTANEOUS | Status: DC
  Administered 2022-12-09 – 2022-12-18 (×10): 6 via TOPICAL

## 2022-12-09 NOTE — Progress Notes (Signed)
PROGRESS NOTE    Crystal [name]  BLT:[id number] DOB: 1945/12/10 DOA: 12/08/2022 PCP: Gaynelle Arabian, MD   Brief Narrative:  Crystal [name] is a 77 y.o. female with medical history significant for pulmonary embolism on Eliquis, non-small cell lung cancer who just completed cycle 1 of carboplatin/Alimta/Pembrolizumab 11/29/2022 under the care of Dr. Benay Spice and is being admitted to the hospital with neutropenia and skin breakdown.  Unfortunately on 12/03/2022 she started to develop sores and diarrhea unable to take anything by mouth due to severe pain.  She also developed a rash in the lower abdomen, which looks significantly worse today, with increasing erythema, and some bloody drainage intermittently.  Oncology recommended admission for supportive care with IV fluids, and empiric IV antibiotics given her neutropenic status.   Assessment & Plan:   Chemotherapy-induced neutropenia: -Patient is currently afebrile.  Hemodynamically stable.   -Continue Rocephin and vancomycin.  Lactic acid: WNL.  Blood culture no growth yet.   -Continue Granix -Continue to monitor CBC  -Appreciate oncology recommendations  Mucositis: -Severe mouth sores and severe rash in her groin area -Continue Magic mouthwash with lidocaine.  Continue Diflucan 100 mg daily -Continue nystatin powder -Continue supportive care.  Soft diet  Hypertension: Stable on metoprolol.  Continue same  Hypokalemia: Replenished.  Check magnesium level. Repeat BMP tomorrow a.m.  CKD stage IIIa: At baseline.  Continue to monitor  Metastatic non-small cell lung cancer: -Diagnosed in November 2023.  Underwent radiation therapy in December and currently on chemo. -Followed by oncology outpatient  History of recurrent PE: Continue Eliquis  Chronic HFrEF: -Echo from 11/12/2022 ejection fraction of 45 to 50% with grade 1 Expectuss function.  Patient currently not in fluid overload.  Continue gentle hydration and monitor signs of fluid  overload.  Strict INO's and daily weight.  Anxiety: Continue Ativan as needed  Morbid obesity with BMI of 35 -Associated with hypertension, cancer, CHF -Outpatient follow-up  DVT prophylaxis: Eliquis Code Status: DNR Family Communication:  None present at bedside.  Plan of care discussed with patient in length and he verbalized understanding and agreed with it. Disposition Plan: To be determined  Consultants:  Oncology  Procedures:  None  Antimicrobials:  Rocephin Vancomycin  Status is: Inpatient     Subjective: Patient seen and examined.  Tearful due to severe sores in her mouth and redness in her lower abdominal/groin area.  She remained afebrile.  Still unable to eat anything due to pain.  Objective: Vitals:   12/08/22 1757 12/08/22 1943 12/09/22 0117 12/09/22 0431  BP: 103/68 139/64 130/71 134/69  Pulse: 100 99 (!) 103 (!) 102  Resp: 20 18 17 18   Temp: 99.1 F (37.3 C) 98.8 F (37.1 C) 98.2 F (36.8 C) 98.1 F (36.7 C)  TempSrc: Oral Oral Oral Oral  SpO2: 93% 97% 93% 96%  Weight:      Height:        Intake/Output Summary (Last 24 hours) at 12/09/2022 1153 Last data filed at 12/09/2022 0600 Gross per 24 hour  Intake 1382.36 ml  Output --  Net 1382.36 ml   Filed Weights   12/08/22 1500  Weight: 95.7 kg    Examination:  General exam: Appears calm and comfortable, on room air Oral: Ulcers noted on left side of tongue. Respiratory system: Clear to auscultation. Respiratory effort normal. Cardiovascular system: S1 & S2 heard, RRR. No JVD, murmurs, rubs, gallops or clicks. No pedal edema. Gastrointestinal system: Abdomen is nondistended, soft and nontender. No organomegaly or masses felt. Normal  bowel sounds heard. Central nervous system: Alert and oriented. No focal neurological deficits. Extremities: Symmetric 5 x 5 power. Skin: Erythematous rash noted on left side of upper chest Erythematous, swollen, warm to touch, moist rash noted underneath her  abdominal pannus.  No bleeding or discharge.  Nystatin powder applied.  Nontender on palpation Psychiatry: Tearful  Data Reviewed: I have personally reviewed following labs and imaging studies  CBC: Recent Labs  Lab 12/05/22 1458 12/06/22 1217 12/08/22 0843 12/09/22 0600  WBC 2.1* 1.6* 0.7* 0.8*  NEUTROABS 1.2* 0.6* 0.2* 0.1*  HGB 11.4* 11.2* 10.2* 9.0*  HCT 34.9* 34.9* 32.1* 29.3*  MCV 91.6 92.3 92.8 95.8  PLT 153 128* 60* 32*   Basic Metabolic Panel: Recent Labs  Lab 12/05/22 1458 12/06/22 1217 12/08/22 0843 12/09/22 0600  NA 141 144 143 144  K 3.2* 3.1* 3.5 3.3*  CL 105 110 108 109  CO2 29 26 28 26   GLUCOSE 121* 106* 129* 103*  BUN 25* 25* 23 18  CREATININE 1.52* 1.47* 1.55* 1.45*  CALCIUM 7.9* 7.3* 8.0* 7.2*  MG 1.4* 1.3* 1.8  --    GFR: Estimated Creatinine Clearance: 37.2 mL/min (A) (by C-G formula based on SCr of 1.45 mg/dL (H)). Liver Function Tests: Recent Labs  Lab 12/05/22 1458 12/08/22 0843  AST 21 15  ALT 12 9  ALKPHOS 51 43  BILITOT 0.7 0.6  PROT 5.9* 5.8*  ALBUMIN 3.0* 2.9*   No results for input(s): "LIPASE", "AMYLASE" in the last 168 hours. No results for input(s): "AMMONIA" in the last 168 hours. Coagulation Profile: No results for input(s): "INR", "PROTIME" in the last 168 hours. Cardiac Enzymes: No results for input(s): "CKTOTAL", "CKMB", "CKMBINDEX", "TROPONINI" in the last 168 hours. BNP (last 3 results) No results for input(s): "PROBNP" in the last 8760 hours. HbA1C: No results for input(s): "HGBA1C" in the last 72 hours. CBG: No results for input(s): "GLUCAP" in the last 168 hours. Lipid Profile: No results for input(s): "CHOL", "HDL", "LDLCALC", "TRIG", "CHOLHDL", "LDLDIRECT" in the last 72 hours. Thyroid Function Tests: No results for input(s): "TSH", "T4TOTAL", "FREET4", "T3FREE", "THYROIDAB" in the last 72 hours. Anemia Panel: No results for input(s): "VITAMINB12", "FOLATE", "FERRITIN", "TIBC", "IRON", "RETICCTPCT" in the  last 72 hours. Sepsis Labs: Recent Labs  Lab 12/08/22 1531 12/08/22 2225  LATICACIDVEN 1.2 0.8    Recent Results (from the past 240 hour(s))  Culture, blood (Routine X 2) w Reflex to ID Panel     Status: None (Preliminary result)   Collection Time: 12/08/22  3:31 PM   Specimen: BLOOD LEFT ARM  Result Value Ref Range Status   Specimen Description   Final    BLOOD LEFT ARM Performed at [facility], [place] [address] [place], [place] [address]    Special Requests   Final    BOTTLES DRAWN AEROBIC ONLY Blood Culture adequate volume Performed at [facility] [address], [place], [place] [address]    Culture   Final    NO GROWTH < 24 HOURS Performed at [facility] [address], [place], [place] [address]    Report Status PENDING  Incomplete  Culture, blood (Routine X 2) w Reflex to ID Panel     Status: None (Preliminary result)   Collection Time: 12/08/22  3:35 PM   Specimen: BLOOD  Result Value Ref Range Status   Specimen Description   Final    BLOOD AEROBIC BOTTLE ONLY Performed at [facility], [address] [place], [place] [address]    Special Requests   Final    BOTTLES DRAWN AEROBIC ONLY Blood Culture adequate volume Performed at [facility] [address] [place], [place] [address]    Culture   Final    NO GROWTH < 24 HOURS Performed at [facility] [address] [place], [place] [address]    Report Status PENDING  Incomplete      Radiology Studies: No results found.  Scheduled Meds:  apixaban  5 mg Oral BID   Chlorhexidine Gluconate Cloth  6 each Topical Daily   docusate sodium  100 mg Oral BID   fluconazole  100 mg Oral Daily   folic acid  1 mg Oral Daily   metoprolol tartrate  100 mg Oral BID   nystatin  1 Application Topical TID   Tbo-filgastrim (GRANIX) SQ  480 mcg Subcutaneous q1800   Continuous Infusions:  0.9 % NaCl with KCl 20 mEq / L 75 mL/hr at 12/08/22  1746   cefTRIAXone (ROCEPHIN)  IV 2 g (12/08/22 1511)   vancomycin 1,250 mg (12/08/22 1750)     LOS: 1 day   Time spent: 35 minutes   Ishita Mcnerney Loann Quill, MD [facility]  If 7PM-7AM, please contact night-coverage [email or web address] 12/09/2022, 11:53 AM

## 2022-12-09 NOTE — Progress Notes (Signed)
[name] still is in the throes of neutropenia and mucositis.  We do not have about labs back yet.  She still has significant mucositis.  I doubt that her mucositis will improve until her white cell count improves.  She is on antibiotics.  Cultures so far have been negative.  She is on Magic mouthwash with lidocaine.  This may help a little bit.  Again, I just do not think that the mucus will really get better until her white cell count comes up.  She is on Neupogen.  She is having no obvious bleeding.  Again there may be little bit of oozing from the mucositis.  Her platelet count has been on the lower side.  She is having no diarrhea.  She is having no cough or shortness of breath.   Her vital signs show temperature of 98.1.  Pulse 102.  Blood pressure 134/69.  Her head and neck exam shows the mucositis in the oral cavity.  She has some eschars.  There is no adenopathy in the neck.  Lungs are clear bilaterally.  She has good air movement bilaterally.  Cardiac exam regular rate and rhythm.  Abdomen is soft.  Bowel sounds are present.  She has no fluid wave.  There is no palpable liver or spleen tip.  Extremities shows some slight edema in the legs.  She has good strength in upper lower extremities.  Neurological exam shows no focal neurological deficits.  I am a little bit surprised by the profound nature of her leukopenia.  Typically, the protocol that she received for the lung cancer really is not that myelosuppressive.  Again we does have to wait for the white cell count to come up.  We will see what her blood counts are today.  This is really all about supportive care.  I know that she we will get incredible care from everybody upon 6 E.  Lattie Haw, MD  Darlyn Chamber 17:14

## 2022-12-10 DIAGNOSIS — T451X5A Adverse effect of antineoplastic and immunosuppressive drugs, initial encounter: Secondary | ICD-10-CM

## 2022-12-10 DIAGNOSIS — D701 Agranulocytosis secondary to cancer chemotherapy: Secondary | ICD-10-CM | POA: Diagnosis not present

## 2022-12-10 LAB — COMPREHENSIVE METABOLIC PANEL
ALT: 11 U/L (ref 0–44)
AST: 17 U/L (ref 15–41)
Albumin: 2.1 g/dL — ABNORMAL LOW (ref 3.5–5.0)
Alkaline Phosphatase: 41 U/L (ref 38–126)
Anion gap: 9 (ref 5–15)
BUN: 18 mg/dL (ref 8–23)
CO2: 25 mmol/L (ref 22–32)
Calcium: 7.1 mg/dL — ABNORMAL LOW (ref 8.9–10.3)
Chloride: 109 mmol/L (ref 98–111)
Creatinine, Ser: 1.17 mg/dL — ABNORMAL HIGH (ref 0.44–1.00)
GFR, Estimated: 48 mL/min — ABNORMAL LOW (ref >60)
Glucose, Bld: 103 mg/dL — ABNORMAL HIGH (ref 70–99)
Potassium: 3.2 mmol/L — ABNORMAL LOW (ref 3.5–5.1)
Sodium: 143 mmol/L (ref 135–145)
Total Bilirubin: 0.6 mg/dL (ref 0.3–1.2)
Total Protein: 5.5 g/dL — ABNORMAL LOW (ref 6.5–8.1)

## 2022-12-10 LAB — CBC WITH DIFFERENTIAL/PLATELET
Abs Immature Granulocytes: 0 10*3/uL (ref 0.00–0.07)
Basophils Absolute: 0 10*3/uL (ref 0.0–0.1)
Basophils Relative: 0 %
Eosinophils Absolute: 0 10*3/uL (ref 0.0–0.5)
Eosinophils Relative: 2 %
HCT: 29.6 % — ABNORMAL LOW (ref 36.0–46.0)
Hemoglobin: 9.1 g/dL — ABNORMAL LOW (ref 12.0–15.0)
Immature Granulocytes: 0 %
Lymphocytes Relative: 93 %
Lymphs Abs: 0.6 10*3/uL — ABNORMAL LOW (ref 0.7–4.0)
MCH: 29.4 pg (ref 26.0–34.0)
MCHC: 30.7 g/dL (ref 30.0–36.0)
MCV: 95.5 fL (ref 80.0–100.0)
Monocytes Absolute: 0 10*3/uL — ABNORMAL LOW (ref 0.1–1.0)
Monocytes Relative: 3 %
Neutro Abs: 0 10*3/uL — CL (ref 1.7–7.7)
Neutrophils Relative %: 2 %
Platelets: 16 10*3/uL — CL (ref 150–400)
RBC: 3.1 MIL/uL — ABNORMAL LOW (ref 3.87–5.11)
RDW: 14.6 % (ref 11.5–15.5)
WBC: 0.6 10*3/uL — CL (ref 4.0–10.5)
nRBC: 0 % (ref 0.0–0.2)

## 2022-12-10 MED ORDER — HYDROMORPHONE HCL 1 MG/ML IJ SOLN
0.5000 mg | INTRAMUSCULAR | Status: DC | PRN
  Administered 2022-12-12 – 2022-12-13 (×2): 0.5 mg via INTRAVENOUS
  Filled 2022-12-10 (×2): qty 0.5

## 2022-12-10 MED ORDER — POTASSIUM CHLORIDE IN NACL 40-0.9 MEQ/L-% IV SOLN
INTRAVENOUS | Status: DC
  Filled 2022-12-10 (×8): qty 1000

## 2022-12-10 MED ORDER — VANCOMYCIN HCL 750 MG/150ML IV SOLN
750.0000 mg | INTRAVENOUS | Status: DC
  Administered 2022-12-10 – 2022-12-13 (×4): 750 mg via INTRAVENOUS
  Filled 2022-12-10 (×5): qty 150

## 2022-12-10 MED ORDER — MAGNESIUM SULFATE 2 GM/50ML IV SOLN
2.0000 g | Freq: Once | INTRAVENOUS | Status: AC
  Administered 2022-12-10: 2 g via INTRAVENOUS
  Filled 2022-12-10: qty 50

## 2022-12-10 NOTE — Progress Notes (Signed)
PROGRESS NOTE    Crystal [name]  UVO:[id number] DOB: 04-05-1946 DOA: 12/08/2022 PCP: Gaynelle Arabian, MD   Brief Narrative:  Crystal [name] is a 77 y.o. female with medical history significant for pulmonary embolism on Eliquis, non-small cell lung cancer who just completed cycle 1 of carboplatin/Alimta/Pembrolizumab 11/29/2022 under the care of Dr. Benay Spice and is being admitted to the hospital with neutropenia and skin breakdown.  Unfortunately on 12/03/2022 she started to develop sores and diarrhea unable to take anything by mouth due to severe pain.  She also developed a rash in the lower abdomen, which looks significantly worse today, with increasing erythema, and some bloody drainage intermittently.  Oncology recommended admission for supportive care with IV fluids, and empiric IV antibiotics given her neutropenic status.   Assessment & Plan:   Chemotherapy-induced neutropenia: -Patient is currently afebrile.  Hemodynamically stable.   -Continue Rocephin and vancomycin.  Lactic acid: WNL.  Blood culture no growth yet.   -Continue G-CSF -Continue to monitor CBC.  Transfuse as needed -Appreciate oncology recommendations  Pancytopenia: -Likely chemotherapy induced.  WBC: 0.6, H&H: 9.1/29.6.  Platelet dropped to 16,000 today. -Transfuse platelet if less than 10 or bleeding -Monitor closely.  Stop Eliquis  Mucositis: -Severe mouth sores and severe rash in her groin area -Continue Magic mouthwash with lidocaine.  Finished Diflucan 100 mg x 2 -Continue nystatin powder, hydrocortisone cream -Continue supportive care.  Soft diet  Hypertension: Stable on metoprolol.  Continue same  Hypokalemia: Replenished.   Repeat BMP tomorrow a.m.  Hypomagnesemia: Replenished.  Repeat magnesium level tomorrow a.m.  CKD stage IIIa: At baseline.  Continue to monitor  Metastatic non-small cell lung cancer: -Diagnosed in November 2023.  Underwent radiation therapy in December and currently on  chemo. -Followed by oncology outpatient  History of recurrent PE: Continue Eliquis  Chronic HFrEF: -Echo from 11/12/2022 ejection fraction of 45 to 50% with grade 1 Expectuss function.  Patient currently not in fluid overload.  Continue gentle hydration and monitor signs of fluid overload.  Strict INO's and daily weight.  Anxiety: Continue Ativan as needed  Morbid obesity with BMI of 35 -Associated with hypertension, cancer, CHF -Outpatient follow-up  DVT prophylaxis: SCD, stop Eliquis due to worsening platelet count. Code Status: DNR Family Communication:  None present at bedside.  Plan of care discussed with patient in length and he verbalized understanding and agreed with it. Disposition Plan: To be determined  Consultants:  Oncology  Procedures:  None  Antimicrobials:  Rocephin Vancomycin  Status is: Inpatient     Subjective: Patient seen and examined.  Reports overall improvement in symptoms.  Mild pain have improved.  Rash has improved as well.  Remained afebrile.  No acute events overnight.  Tearful during the encounter  Objective: Vitals:   12/09/22 1404 12/09/22 1957 12/09/22 2120 12/10/22 0402  BP: 108/71 111/69 113/84 138/88  Pulse: 98 98 100 94  Resp:  18 15 16   Temp: 98 F (36.7 C) 98.4 F (36.9 C) 98.1 F (36.7 C) 97.7 F (36.5 C)  TempSrc: Oral Oral Oral Oral  SpO2: 91% 94% 91% 91%  Weight:      Height:        Intake/Output Summary (Last 24 hours) at 12/10/2022 0915 Last data filed at 12/10/2022 0847 Gross per 24 hour  Intake 1662.47 ml  Output 352 ml  Net 1310.47 ml    Filed Weights   12/08/22 1500  Weight: 95.7 kg    Examination:  General exam: Appears calm and comfortable,  on room air Oral: Ulcers noted on left side of tongue.  Peeling of the skin noted.  No bleeding or discharge seen Respiratory system: Clear to auscultation. Respiratory effort normal. Cardiovascular system: S1 & S2 heard, RRR. No JVD, murmurs, rubs, gallops or  clicks. No pedal edema. Gastrointestinal system: Abdomen is nondistended, soft and nontender. No organomegaly or masses felt. Normal bowel sounds heard. Central nervous system: Alert and oriented. No focal neurological deficits. Extremities: Symmetric 5 x 5 power. Skin: Erythematous/petechial rash noted on left side of upper chest Erythematous, mild swollen,  moist rash noted underneath her abdominal pannus/vaginal area.  No bleeding or discharge.  Nystatin powder applied.  Nontender on palpation Psychiatry: Tearful  Data Reviewed: I have personally reviewed following labs and imaging studies  CBC: Recent Labs  Lab 12/05/22 1458 12/06/22 1217 12/08/22 0843 12/09/22 0600 12/10/22 0500  WBC 2.1* 1.6* 0.7* 0.8* 0.6*  NEUTROABS 1.2* 0.6* 0.2* 0.1* 0.0*  HGB 11.4* 11.2* 10.2* 9.0* 9.1*  HCT 34.9* 34.9* 32.1* 29.3* 29.6*  MCV 91.6 92.3 92.8 95.8 95.5  PLT 153 128* 60* 32* 16*    Basic Metabolic Panel: Recent Labs  Lab 12/05/22 1458 12/06/22 1217 12/08/22 0843 12/09/22 0600 12/10/22 0500  NA 141 144 143 144 143  K 3.2* 3.1* 3.5 3.3* 3.2*  CL 105 110 108 109 109  CO2 29 26 28 26 25   GLUCOSE 121* 106* 129* 103* 103*  BUN 25* 25* 23 18 18   CREATININE 1.52* 1.47* 1.55* 1.45* 1.17*  CALCIUM 7.9* 7.3* 8.0* 7.2* 7.1*  MG 1.4* 1.3* 1.8 1.5*  --     GFR: Estimated Creatinine Clearance: 46.1 mL/min (A) (by C-G formula based on SCr of 1.17 mg/dL (H)). Liver Function Tests: Recent Labs  Lab 12/05/22 1458 12/08/22 0843 12/10/22 0500  AST 21 15 17   ALT 12 9 11   ALKPHOS 51 43 41  BILITOT 0.7 0.6 0.6  PROT 5.9* 5.8* 5.5*  ALBUMIN 3.0* 2.9* 2.1*    No results for input(s): "LIPASE", "AMYLASE" in the last 168 hours. No results for input(s): "AMMONIA" in the last 168 hours. Coagulation Profile: No results for input(s): "INR", "PROTIME" in the last 168 hours. Cardiac Enzymes: No results for input(s): "CKTOTAL", "CKMB", "CKMBINDEX", "TROPONINI" in the last 168 hours. BNP (last  3 results) No results for input(s): "PROBNP" in the last 8760 hours. HbA1C: No results for input(s): "HGBA1C" in the last 72 hours. CBG: No results for input(s): "GLUCAP" in the last 168 hours. Lipid Profile: No results for input(s): "CHOL", "HDL", "LDLCALC", "TRIG", "CHOLHDL", "LDLDIRECT" in the last 72 hours. Thyroid Function Tests: No results for input(s): "TSH", "T4TOTAL", "FREET4", "T3FREE", "THYROIDAB" in the last 72 hours. Anemia Panel: No results for input(s): "VITAMINB12", "FOLATE", "FERRITIN", "TIBC", "IRON", "RETICCTPCT" in the last 72 hours. Sepsis Labs: Recent Labs  Lab 12/08/22 1531 12/08/22 2225  LATICACIDVEN 1.2 0.8     Recent Results (from the past 240 hour(s))  Culture, blood (Routine X 2) w Reflex to ID Panel     Status: None (Preliminary result)   Collection Time: 12/08/22  3:31 PM   Specimen: BLOOD LEFT ARM  Result Value Ref Range Status   Specimen Description   Final    BLOOD LEFT ARM Performed at [facility], [place] [address] [place], [place] [address]    Special Requests   Final    BOTTLES DRAWN AEROBIC ONLY Blood Culture adequate volume Performed at [facility] [address], [place], [place] [address]  Culture   Final    NO GROWTH 2 DAYS Performed at [facility], [place] [address], [place], [place] [address]    Report Status PENDING  Incomplete  Culture, blood (Routine X 2) w Reflex to ID Panel     Status: None (Preliminary result)   Collection Time: 12/08/22  3:35 PM   Specimen: BLOOD  Result Value Ref Range Status   Specimen Description   Final    BLOOD AEROBIC BOTTLE ONLY Performed at [facility] [address], [place], [place] [address]    Special Requests   Final    BOTTLES DRAWN AEROBIC ONLY Blood Culture adequate volume Performed at [facility] [address] [place], [place] [address]    Culture   Final    NO GROWTH 2 DAYS Performed at [facility] [address], [place], [place] [address]    Report Status PENDING  Incomplete      Radiology Studies: No results found.  Scheduled Meds:  Chlorhexidine Gluconate Cloth  6 each Topical Daily   docusate sodium  100 mg Oral BID   folic acid  1 mg Oral Daily   metoprolol tartrate  100 mg Oral BID   nystatin  1 Application Topical TID   Tbo-filgastrim (GRANIX) SQ  480 mcg Subcutaneous q1800   Continuous Infusions:  0.9 % NaCl with KCl 40 mEq / L 75 mL/hr at 12/10/22 0913   cefTRIAXone (ROCEPHIN)  IV 2 g (12/09/22 1618)   vancomycin 1,250 mg (12/08/22 1750)     LOS: 2 days   Time spent: 35 minutes   Rayen Dafoe Loann Quill, MD [facility]  If 7PM-7AM, please contact night-coverage [email or web address] 12/10/2022, 9:15 AM
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
## 2022-12-10 NOTE — Progress Notes (Addendum)
Pharmacy Antibiotic Note  Crystal [name] is a 77 y.o. female admitted on 12/08/2022 with cellulitis, neutropenia.  Pharmacy has been consulted for vancomycin dosing.  Plan: Ceftriaxone per MD Change to Vancomycin 750 mg IV q24h  (SCr 1.14, Vd 0.5, est AUC 455) Measure Vanc levels as needed.  Goal AUC = 400 - 550  Follow up renal function, culture results, and clinical course.    Height: 5\' 5"  (165.1 cm) Weight: 95.7 kg (211 lb) IBW/kg (Calculated) : 57  Temp (24hrs), Avg:98.1 F (36.7 C), Min:97.7 F (36.5 C), Max:98.4 F (36.9 C)  Recent Labs  Lab 12/05/22 1458 12/06/22 1217 12/08/22 0843 12/08/22 1531 12/08/22 2225 12/09/22 0600 12/10/22 0500  WBC 2.1* 1.6* 0.7*  --   --  0.8* 0.6*  CREATININE 1.52* 1.47* 1.55*  --   --  1.45* 1.17*  LATICACIDVEN  --   --   --  1.2 0.8  --   --      Estimated Creatinine Clearance: 46.1 mL/min (A) (by C-G formula based on SCr of 1.17 mg/dL (H)).    Allergies  Allergen Reactions   Simvastatin Other (See Comments)    Leg cramps   Zoloft [Sertraline] Nausea Only   Codeine Other (See Comments)    Bad Headaches   Coreg Other (See Comments)    "Made my legs hurt"   Lisinopril Cough   Tizanidine Hcl Rash and Other (See Comments)    Hypotension, also    Antimicrobials this admission: 2/16 Vanc >> 2/16 Ceftriaxone >> 2/17 Fluconazole >> 2/18  Dose adjustments this admission:  Microbiology results: 2/16 BCx: ngtd  Thank you for allowing pharmacy to be a part of this patient's care.  Gretta Arab PharmD, BCPS [facility] main pharmacy [phone] 12/10/2022 1:08 PM

## 2022-12-11 ENCOUNTER — Encounter: Payer: Self-pay | Admitting: Oncology

## 2022-12-11 DIAGNOSIS — T451X5A Adverse effect of antineoplastic and immunosuppressive drugs, initial encounter: Secondary | ICD-10-CM | POA: Diagnosis not present

## 2022-12-11 DIAGNOSIS — D701 Agranulocytosis secondary to cancer chemotherapy: Secondary | ICD-10-CM | POA: Diagnosis not present

## 2022-12-11 DIAGNOSIS — C3490 Malignant neoplasm of unspecified part of unspecified bronchus or lung: Secondary | ICD-10-CM | POA: Diagnosis not present

## 2022-12-11 LAB — CBC WITH DIFFERENTIAL/PLATELET
Abs Immature Granulocytes: 0.01 10*3/uL (ref 0.00–0.07)
Basophils Absolute: 0 10*3/uL (ref 0.0–0.1)
Basophils Relative: 0 %
Eosinophils Absolute: 0 10*3/uL (ref 0.0–0.5)
Eosinophils Relative: 0 %
HCT: 29.2 % — ABNORMAL LOW (ref 36.0–46.0)
Hemoglobin: 9 g/dL — ABNORMAL LOW (ref 12.0–15.0)
Immature Granulocytes: 1 %
Lymphocytes Relative: 92 %
Lymphs Abs: 0.7 10*3/uL (ref 0.7–4.0)
MCH: 29.2 pg (ref 26.0–34.0)
MCHC: 30.8 g/dL (ref 30.0–36.0)
MCV: 94.8 fL (ref 80.0–100.0)
Monocytes Absolute: 0 10*3/uL — ABNORMAL LOW (ref 0.1–1.0)
Monocytes Relative: 4 %
Neutro Abs: 0 10*3/uL — CL (ref 1.7–7.7)
Neutrophils Relative %: 3 %
Platelets: 6 10*3/uL — CL (ref 150–400)
RBC: 3.08 MIL/uL — ABNORMAL LOW (ref 3.87–5.11)
RDW: 14.3 % (ref 11.5–15.5)
WBC: 0.8 10*3/uL — CL (ref 4.0–10.5)
nRBC: 0 % (ref 0.0–0.2)

## 2022-12-11 LAB — BASIC METABOLIC PANEL
Anion gap: 8 (ref 5–15)
BUN: 15 mg/dL (ref 8–23)
CO2: 25 mmol/L (ref 22–32)
Calcium: 7 mg/dL — ABNORMAL LOW (ref 8.9–10.3)
Chloride: 108 mmol/L (ref 98–111)
Creatinine, Ser: 1.26 mg/dL — ABNORMAL HIGH (ref 0.44–1.00)
GFR, Estimated: 44 mL/min — ABNORMAL LOW (ref >60)
Glucose, Bld: 104 mg/dL — ABNORMAL HIGH (ref 70–99)
Potassium: 3.3 mmol/L — ABNORMAL LOW (ref 3.5–5.1)
Sodium: 141 mmol/L (ref 135–145)

## 2022-12-11 LAB — MAGNESIUM: Magnesium: 1.8 mg/dL (ref 1.7–2.4)

## 2022-12-11 LAB — TYPE AND SCREEN
ABO/RH(D): A NEG
Antibody Screen: NEGATIVE

## 2022-12-11 LAB — PLATELET COUNT: Platelets: 31 10*3/uL — ABNORMAL LOW (ref 150–400)

## 2022-12-11 LAB — ABO/RH: ABO/RH(D): A NEG

## 2022-12-11 MED ORDER — SODIUM CHLORIDE 0.9% IV SOLUTION: Freq: Once | INTRAVENOUS | Status: AC

## 2022-12-11 MED ORDER — CALCIUM GLUCONATE-NACL 1-0.675 GM/50ML-% IV SOLN
1.0000 g | Freq: Once | INTRAVENOUS | Status: AC
  Administered 2022-12-11: 1000 mg via INTRAVENOUS
  Filled 2022-12-11: qty 50
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
## 2022-12-11 NOTE — Care Management Important Message (Signed)
Important Message  Patient Details IM Letter given. Name: Crystal [name] MRN: [id number] Date of Birth: August 31, 1946   Medicare Important Message Given:  Yes     Kerin Salen 12/11/2022, 9:01 AM

## 2022-12-11 NOTE — Progress Notes (Signed)
PROGRESS NOTE    Crystal [name]  JJH:[id number] DOB: 1946-09-11 DOA: 12/08/2022 PCP: Gaynelle Arabian, MD   Brief Narrative:  Crystal [name] is a 77 y.o. female with medical history significant for pulmonary embolism on Eliquis, non-small cell lung cancer who just completed cycle 1 of carboplatin/Alimta/Pembrolizumab 11/29/2022 under the care of Dr. Benay Spice and is being admitted to the hospital with neutropenia and skin breakdown.  Unfortunately on 12/03/2022 she started to develop sores and diarrhea unable to take anything by mouth due to severe pain.  She also developed a rash in the lower abdomen, which looks significantly worse today, with increasing erythema, and some bloody drainage intermittently.  Oncology recommended admission for supportive care with IV fluids, and empiric IV antibiotics given her neutropenic status.   Assessment & Plan:   Chemotherapy-induced neutropenia: -Patient is currently afebrile.  Hemodynamically stable.   -Continue Rocephin and vancomycin.  Lactic acid: WNL.  Blood culture no growth yet.   -Continue G-CSF -Continue to monitor CBC.  Transfuse as needed -Appreciate oncology recommendations  Severe pancytopenia: -Likely chemotherapy induced.   -  Platelet dropped to 6000 today.  Transfuse 1 unit platelets and will repeat after the transfusion.  Will continue to monitor signs for bleeding. -Monitor closely.  Stop Eliquis  Mucositis: Improving -Severe mouth sores and severe rash in her groin area -Continue Magic mouthwash with lidocaine.  Finished Diflucan 100 mg x 2 -Continue nystatin powder, hydrocortisone cream -Continue supportive care.  Soft diet  Hypertension: Stable on metoprolol.  Continue same  Hypokalemia: Getting along with IV fluids. -Repeat BMP tomorrow a.m.  Hypomagnesemia: Replenished.    CKD stage IIIa: At baseline.  Continue to monitor  Metastatic non-small cell lung cancer: -Diagnosed in November 2023.  Underwent radiation therapy  in December and currently on chemo. -Followed by oncology outpatient  History of recurrent PE: Eliquis is stopped due to severe thrombocytopenia.  Chronic HFrEF: -Echo from 11/12/2022 ejection fraction of 45 to 50% with grade 1 Expectuss function.  Patient currently not in fluid overload.  Strict INO's and daily weight.  Anxiety: Continue Ativan as needed  Morbid obesity with BMI of 35 -Associated with hypertension, cancer, CHF -Outpatient follow-up  DVT prophylaxis: SCD, stop Eliquis due to worsening platelet count. Code Status: DNR Family Communication:  None present at bedside.  Plan of care discussed with patient in length and he verbalized understanding and agreed with it. Disposition Plan: To be determined  Consultants:  Oncology  Procedures:  None  Antimicrobials:  Rocephin Vancomycin  Status is: Inpatient     Subjective: Patient seen and examined.  Resting comfortably on the bed.  Overall she feels better this morning.  Has less mouth pain as compared to yesterday.  Abdominal rash also has improved.  She denies any bleeding in her stool or urine.  Remained afebrile.  No acute event overnight.  Objective: Vitals:   12/10/22 0402 12/10/22 1312 12/10/22 1950 12/11/22 0502  BP: 138/88 122/72 125/74 (!) 146/98  Pulse: 94 98 (!) 101 (!) 103  Resp: 16 16 20    Temp: 97.7 F (36.5 C) 97.6 F (36.4 C) 99.3 F (37.4 C) 97.7 F (36.5 C)  TempSrc: Oral Oral Oral Oral  SpO2: 91% 92% 91% 96%  Weight:      Height:        Intake/Output Summary (Last 24 hours) at 12/11/2022 0932 Last data filed at 12/11/2022 0342 Gross per 24 hour  Intake 1522.29 ml  Output 301 ml  Net 1221.29 ml  Filed Weights   12/08/22 1500  Weight: 95.7 kg    Examination:  General exam: Appears calm and comfortable, on room air Oral: Large ulcerated area noted on left side of tongue.    No bleeding or discharge seen Respiratory system: Clear to auscultation. Respiratory effort  normal. Cardiovascular system: S1 & S2 heard, RRR. No JVD, murmurs, rubs, gallops or clicks. No pedal edema. Gastrointestinal system: Abdomen is nondistended, soft and nontender. No organomegaly or masses felt. Normal bowel sounds heard. Central nervous system: Alert and oriented. No focal neurological deficits. Extremities: Symmetric 5 x 5 power. Skin: Erythematous/petechial rash noted on left side of upper chest Erythematous rash noted underneath her abdominal pannus/vaginal area.  No bleeding or discharge.  Nystatin powder applied.  Nontender on palpation Psychiatry: Appropriate mood and affect  Data Reviewed: I have personally reviewed following labs and imaging studies  CBC: Recent Labs  Lab 12/06/22 1217 12/08/22 0843 12/09/22 0600 12/10/22 0500 12/11/22 0500  WBC 1.6* 0.7* 0.8* 0.6* 0.8*  NEUTROABS 0.6* 0.2* 0.1* 0.0* 0.0*  HGB 11.2* 10.2* 9.0* 9.1* 9.0*  HCT 34.9* 32.1* 29.3* 29.6* 29.2*  MCV 92.3 92.8 95.8 95.5 94.8  PLT 128* 60* 32* 16* 6*    Basic Metabolic Panel: Recent Labs  Lab 12/05/22 1458 12/06/22 1217 12/08/22 0843 12/09/22 0600 12/10/22 0500 12/11/22 0500  NA 141 144 143 144 143 141  K 3.2* 3.1* 3.5 3.3* 3.2* 3.3*  CL 105 110 108 109 109 108  CO2 29 26 28 26 25 25   GLUCOSE 121* 106* 129* 103* 103* 104*  BUN 25* 25* 23 18 18 15   CREATININE 1.52* 1.47* 1.55* 1.45* 1.17* 1.26*  CALCIUM 7.9* 7.3* 8.0* 7.2* 7.1* 7.0*  MG 1.4* 1.3* 1.8 1.5*  --  1.8    GFR: Estimated Creatinine Clearance: 42.8 mL/min (A) (by C-G formula based on SCr of 1.26 mg/dL (H)). Liver Function Tests: Recent Labs  Lab 12/05/22 1458 12/08/22 0843 12/10/22 0500  AST 21 15 17   ALT 12 9 11   ALKPHOS 51 43 41  BILITOT 0.7 0.6 0.6  PROT 5.9* 5.8* 5.5*  ALBUMIN 3.0* 2.9* 2.1*    No results for input(s): "LIPASE", "AMYLASE" in the last 168 hours. No results for input(s): "AMMONIA" in the last 168 hours. Coagulation Profile: No results for input(s): "INR", "PROTIME" in the  last 168 hours. Cardiac Enzymes: No results for input(s): "CKTOTAL", "CKMB", "CKMBINDEX", "TROPONINI" in the last 168 hours. BNP (last 3 results) No results for input(s): "PROBNP" in the last 8760 hours. HbA1C: No results for input(s): "HGBA1C" in the last 72 hours. CBG: No results for input(s): "GLUCAP" in the last 168 hours. Lipid Profile: No results for input(s): "CHOL", "HDL", "LDLCALC", "TRIG", "CHOLHDL", "LDLDIRECT" in the last 72 hours. Thyroid Function Tests: No results for input(s): "TSH", "T4TOTAL", "FREET4", "T3FREE", "THYROIDAB" in the last 72 hours. Anemia Panel: No results for input(s): "VITAMINB12", "FOLATE", "FERRITIN", "TIBC", "IRON", "RETICCTPCT" in the last 72 hours. Sepsis Labs: Recent Labs  Lab 12/08/22 1531 12/08/22 2225  LATICACIDVEN 1.2 0.8     Recent Results (from the past 240 hour(s))  Culture, blood (Routine X 2) w Reflex to ID Panel     Status: None (Preliminary result)   Collection Time: 12/08/22  3:31 PM   Specimen: BLOOD LEFT ARM  Result Value Ref Range Status   Specimen Description   Final    BLOOD LEFT ARM Performed at [facility], [place] [address] [place], [place] [address]    Special  Requests   Final    BOTTLES DRAWN AEROBIC ONLY Blood Culture adequate volume Performed at [facility] [address] [place], [place] [address]    Culture   Final    NO GROWTH 3 DAYS Performed at [facility], [place] [address] [place], [place] [address]    Report Status PENDING  Incomplete  Culture, blood (Routine X 2) w Reflex to ID Panel     Status: None (Preliminary result)   Collection Time: 12/08/22  3:35 PM   Specimen: BLOOD  Result Value Ref Range Status   Specimen Description   Final    BLOOD AEROBIC BOTTLE ONLY Performed at [facility] [address] [place], [place] [address]    Special Requests   Final    BOTTLES DRAWN AEROBIC ONLY Blood Culture adequate volume Performed at [facility] [address], [place], [place] [address]    Culture   Final    NO GROWTH 3 DAYS Performed at [facility], [place] [address], [place], [place] [address]    Report Status PENDING  Incomplete      Radiology Studies: No results found.  Scheduled Meds:  sodium chloride   Intravenous Once   Chlorhexidine Gluconate Cloth  6 each Topical Daily   docusate sodium  100 mg Oral BID   folic acid  1 mg Oral Daily   metoprolol tartrate  100 mg Oral BID   nystatin  1 Application Topical TID   Tbo-filgastrim (GRANIX) SQ  480 mcg Subcutaneous q1800   Continuous Infusions:  0.9 % NaCl with KCl 40 mEq / L 75 mL/hr at 12/11/22 0342   cefTRIAXone (ROCEPHIN)  IV Stopped (12/10/22 1704)   vancomycin Stopped (12/10/22 1520)     LOS: 3 days   Time spent: 35 minutes   Thaxton Pelley Loann Quill, MD [facility]  If 7PM-7AM, please contact night-coverage [email or web address] 12/11/2022, 9:32 AM

## 2022-12-11 NOTE — Progress Notes (Signed)
Date and time results received: 12/11/22 0635   Test: WBC 0.8 Plt 6  Name of Provider Notified: Olena Heckle, NP  Orders Received? Or Actions Taken?: Provider aware - no new orders

## 2022-12-12 ENCOUNTER — Inpatient Hospital Stay (HOSPITAL_COMMUNITY): Payer: No Typology Code available for payment source

## 2022-12-12 DIAGNOSIS — C3490 Malignant neoplasm of unspecified part of unspecified bronchus or lung: Secondary | ICD-10-CM

## 2022-12-12 DIAGNOSIS — J9811 Atelectasis: Secondary | ICD-10-CM | POA: Diagnosis not present

## 2022-12-12 DIAGNOSIS — J90 Pleural effusion, not elsewhere classified: Secondary | ICD-10-CM | POA: Diagnosis not present

## 2022-12-12 DIAGNOSIS — T451X5A Adverse effect of antineoplastic and immunosuppressive drugs, initial encounter: Secondary | ICD-10-CM | POA: Diagnosis not present

## 2022-12-12 DIAGNOSIS — D701 Agranulocytosis secondary to cancer chemotherapy: Secondary | ICD-10-CM | POA: Diagnosis not present

## 2022-12-12 LAB — COMPREHENSIVE METABOLIC PANEL
ALT: 10 U/L (ref 0–44)
AST: 14 U/L — ABNORMAL LOW (ref 15–41)
Albumin: 2 g/dL — ABNORMAL LOW (ref 3.5–5.0)
Alkaline Phosphatase: 38 U/L (ref 38–126)
Anion gap: 9 (ref 5–15)
BUN: 14 mg/dL (ref 8–23)
CO2: 27 mmol/L (ref 22–32)
Calcium: 7.1 mg/dL — ABNORMAL LOW (ref 8.9–10.3)
Chloride: 107 mmol/L (ref 98–111)
Creatinine, Ser: 1.18 mg/dL — ABNORMAL HIGH (ref 0.44–1.00)
GFR, Estimated: 48 mL/min — ABNORMAL LOW (ref >60)
Glucose, Bld: 94 mg/dL (ref 70–99)
Potassium: 3.3 mmol/L — ABNORMAL LOW (ref 3.5–5.1)
Sodium: 143 mmol/L (ref 135–145)
Total Bilirubin: 0.5 mg/dL (ref 0.3–1.2)
Total Protein: 5.2 g/dL — ABNORMAL LOW (ref 6.5–8.1)

## 2022-12-12 LAB — BPAM PLATELET PHERESIS
Blood Product Expiration Date: 202402222359
ISSUE DATE / TIME: 202402191308
Unit Type and Rh: 5100

## 2022-12-12 LAB — PREPARE PLATELET PHERESIS: Unit division: 0

## 2022-12-12 LAB — MAGNESIUM: Magnesium: 1.6 mg/dL — ABNORMAL LOW (ref 1.7–2.4)

## 2022-12-12 MED ORDER — MAGNESIUM SULFATE 2 GM/50ML IV SOLN
2.0000 g | Freq: Once | INTRAVENOUS | Status: AC
  Administered 2022-12-12: 2 g via INTRAVENOUS
  Filled 2022-12-12: qty 50

## 2022-12-12 MED ORDER — HYDROMORPHONE HCL 1 MG/ML IJ SOLN
0.5000 mg | Freq: Once | INTRAMUSCULAR | Status: AC
  Administered 2022-12-12: 0.5 mg via INTRAVENOUS
  Filled 2022-12-12: qty 0.5

## 2022-12-12 MED ORDER — ADULT MULTIVITAMIN W/MINERALS CH
1.0000 | ORAL_TABLET | Freq: Every day | ORAL | Status: DC
  Administered 2022-12-12 – 2022-12-18 (×7): 1 via ORAL
  Filled 2022-12-12 (×8): qty 1

## 2022-12-12 MED ORDER — BENZOCAINE 10 % MT GEL
Freq: Two times a day (BID) | OROMUCOSAL | Status: DC | PRN
  Filled 2022-12-12 (×2): qty 9.4

## 2022-12-12 MED ORDER — CALCIUM GLUCONATE-NACL 2-0.675 GM/100ML-% IV SOLN
2.0000 g | Freq: Once | INTRAVENOUS | Status: AC
  Administered 2022-12-12: 2000 mg via INTRAVENOUS
  Filled 2022-12-12: qty 100

## 2022-12-12 MED ORDER — MAGIC MOUTHWASH
10.0000 mL | Freq: Four times a day (QID) | ORAL | Status: DC | PRN
  Administered 2022-12-13 – 2022-12-15 (×3): 10 mL via ORAL
  Filled 2022-12-12 (×5): qty 10

## 2022-12-12 MED ORDER — TBO-FILGRASTIM 480 MCG/0.8ML ~~LOC~~ SOSY
480.0000 ug | PREFILLED_SYRINGE | Freq: Every day | SUBCUTANEOUS | Status: DC
  Administered 2022-12-12 – 2022-12-14 (×3): 480 ug via SUBCUTANEOUS
  Filled 2022-12-12 (×3): qty 0.8

## 2022-12-12 MED ORDER — ENSURE ENLIVE PO LIQD
237.0000 mL | Freq: Two times a day (BID) | ORAL | Status: DC
  Administered 2022-12-12 – 2022-12-17 (×10): 237 mL via ORAL

## 2022-12-12 MED ORDER — POTASSIUM CHLORIDE 20 MEQ PO PACK
40.0000 meq | PACK | Freq: Once | ORAL | Status: AC
  Administered 2022-12-12: 40 meq via ORAL
  Filled 2022-12-12: qty 2
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
## 2022-12-12 NOTE — Progress Notes (Addendum)
PROGRESS NOTE    Crystal [name]  YTK:[id number] DOB: 1946-07-09 DOA: 12/08/2022 PCP: Gaynelle Arabian, MD   Brief Narrative:  Crystal [name] is a 77 y.o. female with medical history significant for pulmonary embolism on Eliquis, non-small cell lung cancer who just completed cycle 1 of carboplatin/Alimta/Pembrolizumab 11/29/2022 under the care of Dr. Benay Spice and is being admitted to the hospital with neutropenia and skin breakdown.  Unfortunately on 12/03/2022 she started to develop sores and diarrhea unable to take anything by mouth due to severe pain.  She also developed a rash in the lower abdomen, which looks significantly worse today, with increasing erythema, and some bloody drainage intermittently.  Oncology recommended admission for supportive care with IV fluids, and empiric IV antibiotics given her neutropenic status.   Hospital course complicated by severe pancytopenia, chemotherapy-induced.  On G CSF and prophylactic IV antibiotics.  12/12/2022: The patient was seen and examined at bedside.  Her sister was present in the room.  Reports dyspnea with minimal exertion and with ambulation.  Chest x-ray showed persistent small right pleural effusion with new small amount of loculated fluid in the fissure.  Right-sided chest tube is unchanged.  Assessment & Plan:   Chemotherapy-induced neutropenia: -Patient is currently afebrile.  Hemodynamically stable.   -Continue Rocephin and vancomycin.  Lactic acid: WNL.  Blood culture no growth yet.   Continue G-CSF -Continue to monitor CBC.  Transfuse as needed -Appreciate oncology recommendations  Severe pancytopenia: -Likely chemotherapy induced.   Continue G-CSF Home Eliquis stopped. Appreciate medical oncology's assistance.  Persistent small right pleural effusion with new small amount of loculated fluid in the fissure.   Right-sided chest tube is unchanged. Monitor output from right-sided chest tube.  Mucositis: Improving -Severe mouth  sores and severe rash in her groin area -Continue Magic mouthwash with lidocaine.  Finished Diflucan 100 mg x 2 -Continue nystatin powder, hydrocortisone cream -Continue supportive care.  Soft diet  Hypertension: Stable on metoprolol.  Continue same  Refractory, repleted hypokalemia: Getting along with IV fluids. -Repeat BMP tomorrow a.m.  Refractory, repleted hypomagnesemia: Repeat level in the morning  CKD stage IIIa: At baseline.  Continue to monitor  Metastatic non-small cell lung cancer: -Diagnosed in November 2023.  Underwent radiation therapy in December and currently on chemo. -Followed by oncology outpatient  History of recurrent PE: Eliquis is stopped due to severe thrombocytopenia.  Chronic HFrEF: -Echo from 11/12/2022 ejection fraction of 45 to 50% with grade 1 Expectuss function.  Patient currently not in fluid overload.  Continue strict I's and O's and daily weight  Anxiety: Continue Ativan as needed  Morbid obesity with BMI of 35 -Associated with hypertension, cancer, CHF -Outpatient follow-up  DVT prophylaxis: SCD, stop Eliquis due to worsening platelet count. Code Status: DNR Family Communication: Updated her sister at bedside.  Disposition Plan: To be determined  Consultants:  Oncology  Procedures:  None  Antimicrobials:  Rocephin Vancomycin  Status is: Inpatient       Objective: Vitals:   12/11/22 2141 12/12/22 0627 12/12/22 0838 12/12/22 1340  BP: 129/87 (!) 137/101 126/75 132/89  Pulse: (!) 110 (!) 105 (!) 105 98  Resp: 16 18  18   Temp: 98.3 F (36.8 C) 98.4 F (36.9 C)  97.9 F (36.6 C)  TempSrc: Oral Oral  Oral  SpO2: 90% 93%  93%  Weight:      Height:        Intake/Output Summary (Last 24 hours) at 12/12/2022 1844 Last data filed at 12/12/2022 1751 Gross  per 24 hour  Intake 240 ml  Output 0 ml  Net 240 ml   Filed Weights   12/08/22 1500  Weight: 95.7 kg    Examination:  General exam: Well-developed well-nourished  in no acute distress.  She is alert oriented x 3.   Respiratory system: Clear to auscultation with no wheezes or rales. Cardiovascular system: Regular rate and rhythm no rubs or gallops.   Gastrointestinal system: Soft noted normal bowel sounds present.  Central nervous system: Alert and oriented. No focal neurological deficits. Extremities: Symmetric 5 x 5 power. Skin: Erythematous/petechial rash noted on left side of upper chest Erythematous rash noted underneath her abdominal pannus/vaginal area.  No bleeding or discharge.  Nystatin powder applied.  Nontender on palpation Psychiatry: Mood is appropriate for condition and setting.  Data Reviewed: I have personally reviewed following labs and imaging studies  CBC: Recent Labs  Lab 12/08/22 0843 12/09/22 0600 12/10/22 0500 12/11/22 0500 12/11/22 1027 12/12/22 0635  WBC 0.7* 0.8* 0.6* 0.8*  --  0.7*  NEUTROABS 0.2* 0.1* 0.0* 0.0*  --  0.1*  HGB 10.2* 9.0* 9.1* 9.0*  --  8.9*  HCT 32.1* 29.3* 29.6* 29.2*  --  28.2*  MCV 92.8 95.8 95.5 94.8  --  93.7  PLT 60* 32* 16* 6* 31* 21*   Basic Metabolic Panel: Recent Labs  Lab 12/06/22 1217 12/08/22 0843 12/09/22 0600 12/10/22 0500 12/11/22 0500 12/12/22 0635  NA 144 143 144 143 141 143  K 3.1* 3.5 3.3* 3.2* 3.3* 3.3*  CL 110 108 109 109 108 107  CO2 26 28 26 25 25 27   GLUCOSE 106* 129* 103* 103* 104* 94  BUN 25* 23 18 18 15 14   CREATININE 1.47* 1.55* 1.45* 1.17* 1.26* 1.18*  CALCIUM 7.3* 8.0* 7.2* 7.1* 7.0* 7.1*  MG 1.3* 1.8 1.5*  --  1.8 1.6*   GFR: Estimated Creatinine Clearance: 45.7 mL/min (A) (by C-G formula based on SCr of 1.18 mg/dL (H)). Liver Function Tests: Recent Labs  Lab 12/08/22 0843 12/10/22 0500 12/12/22 0635  AST 15 17 14*  ALT 9 11 10   ALKPHOS 43 41 38  BILITOT 0.6 0.6 0.5  PROT 5.8* 5.5* 5.2*  ALBUMIN 2.9* 2.1* 2.0*   No results for input(s): "LIPASE", "AMYLASE" in the last 168 hours. No results for input(s): "AMMONIA" in the last 168  hours. Coagulation Profile: No results for input(s): "INR", "PROTIME" in the last 168 hours. Cardiac Enzymes: No results for input(s): "CKTOTAL", "CKMB", "CKMBINDEX", "TROPONINI" in the last 168 hours. BNP (last 3 results) No results for input(s): "PROBNP" in the last 8760 hours. HbA1C: No results for input(s): "HGBA1C" in the last 72 hours. CBG: No results for input(s): "GLUCAP" in the last 168 hours. Lipid Profile: No results for input(s): "CHOL", "HDL", "LDLCALC", "TRIG", "CHOLHDL", "LDLDIRECT" in the last 72 hours. Thyroid Function Tests: No results for input(s): "TSH", "T4TOTAL", "FREET4", "T3FREE", "THYROIDAB" in the last 72 hours. Anemia Panel: No results for input(s): "VITAMINB12", "FOLATE", "FERRITIN", "TIBC", "IRON", "RETICCTPCT" in the last 72 hours. Sepsis Labs: Recent Labs  Lab 12/08/22 1531 12/08/22 2225  LATICACIDVEN 1.2 0.8    Recent Results (from the past 240 hour(s))  Culture, blood (Routine X 2) w Reflex to ID Panel     Status: None (Preliminary result)   Collection Time: 12/08/22  3:31 PM   Specimen: BLOOD LEFT ARM  Result Value Ref Range Status   Specimen Description   Final    BLOOD LEFT ARM Performed at [facility], [place] [address], [place], [place] [address]    Special Requests   Final    BOTTLES DRAWN AEROBIC ONLY Blood Culture adequate volume Performed at [facility] [address], [place], [place] [address]    Culture   Final    NO GROWTH 4 DAYS Performed at [facility], [place] [address] [place], [place] [address]    Report Status PENDING  Incomplete  Culture, blood (Routine X 2) w Reflex to ID Panel     Status: None (Preliminary result)   Collection Time: 12/08/22  3:35 PM   Specimen: BLOOD  Result Value Ref Range Status   Specimen Description   Final    BLOOD AEROBIC BOTTLE ONLY Performed at [facility] [address] [place], [place] [address]    Special Requests   Final     BOTTLES DRAWN AEROBIC ONLY Blood Culture adequate volume Performed at [facility] [address] [place], [place] [address]    Culture   Final    NO GROWTH 4 DAYS Performed at [facility], [place] [address], [place], [place] [address]    Report Status PENDING  Incomplete      Radiology Studies: DG CHEST PORT 1 VIEW  Result Date: 12/12/2022 CLINICAL DATA:  Right chest tube dysfunction. EXAM: PORTABLE CHEST 1 VIEW COMPARISON:  Chest x-ray dated November 16, 2022. FINDINGS: Unchanged right chest wall port catheter and right-sided chest tube. The heart size and mediastinal contours are within normal limits. Persistent small right pleural effusion with new small amount of loculated fluid in the fissure. Increased mild right basilar atelectasis. Known right middle lobe mass is grossly unchanged. The left lung is clear. No pneumothorax. No acute osseous abnormality. IMPRESSION: 1. Persistent small right pleural effusion with new small amount of loculated fluid in the fissure. Right-sided chest tube is unchanged. Electronically Signed   By: Titus Dubin M.D.   On: 12/12/2022 12:45    Scheduled Meds:  Chlorhexidine Gluconate Cloth  6 each Topical Daily   docusate sodium  100 mg Oral BID   feeding supplement  237 mL Oral BID BM   folic acid  1 mg Oral Daily   metoprolol tartrate  100 mg Oral BID   multivitamin with minerals  1 tablet Oral Daily   nystatin  1 Application Topical TID   Tbo-filgastrim (GRANIX) SQ  480 mcg Subcutaneous q1800   Continuous Infusions:  0.9 % NaCl with KCl 40 mEq / L 75 mL/hr at 12/12/22 0621   cefTRIAXone (ROCEPHIN)  IV 2 g (12/12/22 1634)   vancomycin 750 mg (12/12/22 1504)     LOS: 4 days   Time spent: 35 minutes   Kayleen Memos, MD [facility]  If 7PM-7AM, please contact night-coverage [email or web address] 12/12/2022, 6:44 PM

## 2022-12-12 NOTE — Progress Notes (Signed)
Initial Nutrition Assessment  DOCUMENTATION CODES:   Obesity unspecified  INTERVENTION:   -Ensure Plus High Protein po BID, each supplement provides 350 kcal and 20 grams of protein.   -Multivitamin with minerals daily  -Liberalize diet to regular given pt felt limited in menu options.   NUTRITION DIAGNOSIS:   Inadequate oral intake related to acute illness (mucositis) as evidenced by per patient/family report.  GOAL:   Patient will meet greater than or equal to 90% of their needs  MONITOR:   PO intake, Supplement acceptance, Labs, Weight trends, I & O's  REASON FOR ASSESSMENT:   Malnutrition Screening Tool    ASSESSMENT:   77 y.o. female with medical history significant for pulmonary embolism on Eliquis, non-small cell lung cancer who just completed cycle 1 of carboplatin/Alimta/Pembrolizumab 11/29/2022 under the care of Dr. Benay Spice and is being admitted to the hospital with neutropenia and skin breakdown.  Unfortunately on 12/03/2022 she started to develop sores and diarrhea unable to take anything by mouth due to severe pain. Admitted for chemotherapy induced neutropenia.  Patient in room, sister at bedside. Pt states she hasn't been eating well d/t poor appetite and now is having mouth pain and sores. Pt states Magic Mouthwash didn't help so she is going to try [place] now. Pt was wanting a sweet potato and pinto beans but was told by kitchen she couldn't have these items on the GI soft. Will liberalize diet to regular and let pt choose items she feels she can eat with limited pain. Pt states she was given a "Boost" earlier and liked it. Will order Ensure supplements.   Per weight records, pt has lost 8 lbs since 09/07/22 (3% wt loss x 3 months, insignificant for time frame).  Medications: Colace, Folic acid, Nystatin, calcium gluconate, IV Mg sulfate  Labs reviewed: Low K Low Mg   NUTRITION - FOCUSED PHYSICAL EXAM:  Flowsheet Row Most Recent Value  Orbital Region No  depletion  Upper Arm Region No depletion  Thoracic and Lumbar Region No depletion  Buccal Region Mild depletion  Temple Region No depletion  Clavicle Bone Region No depletion  Clavicle and Acromion Bone Region No depletion  Scapular Bone Region No depletion  Dorsal Hand Mild depletion  Patellar Region No depletion  Anterior Thigh Region No depletion  Posterior Calf Region No depletion  Edema (RD Assessment) None  Hair Reviewed  Eyes Reviewed  Mouth Reviewed  [sores in mouth and on tongue]  Skin Reviewed       Diet Order:   Diet Order             Diet regular Room service appropriate? Yes; Fluid consistency: Thin  Diet effective now                   EDUCATION NEEDS:   Education needs have been addressed  Skin:  Skin Assessment: Skin Integrity Issues: Skin Integrity Issues:: Other (Comment) Other: MASD lower abdomen  Last BM:  2/17  Height:   Ht Readings from Last 1 Encounters:  12/08/22 5\' 5"  (1.651 m)    Weight:   Wt Readings from Last 1 Encounters:  12/08/22 95.7 kg   BMI:  Body mass index is 35.11 kg/m.  Estimated Nutritional Needs:   Kcal:  1900-2100  Protein:  85-100g  Fluid:  2.1L/day  Clayton Bibles, MS, RD, LDN Inpatient Clinical Dietitian Contact information available via Amion

## 2022-12-13 DIAGNOSIS — C3490 Malignant neoplasm of unspecified part of unspecified bronchus or lung: Secondary | ICD-10-CM | POA: Diagnosis not present

## 2022-12-13 DIAGNOSIS — T451X5A Adverse effect of antineoplastic and immunosuppressive drugs, initial encounter: Secondary | ICD-10-CM | POA: Diagnosis not present

## 2022-12-13 DIAGNOSIS — D701 Agranulocytosis secondary to cancer chemotherapy: Secondary | ICD-10-CM | POA: Diagnosis not present

## 2022-12-13 LAB — CBC WITH DIFFERENTIAL/PLATELET
Abs Immature Granulocytes: 0.04 10*3/uL (ref 0.00–0.07)
Basophils Absolute: 0 10*3/uL (ref 0.0–0.1)
Basophils Relative: 1 %
Eosinophils Absolute: 0 10*3/uL (ref 0.0–0.5)
Eosinophils Relative: 0 %
HCT: 28.8 % — ABNORMAL LOW (ref 36.0–46.0)
Hemoglobin: 9.1 g/dL — ABNORMAL LOW (ref 12.0–15.0)
Immature Granulocytes: 4 %
Lymphocytes Relative: 68 %
Lymphs Abs: 0.7 10*3/uL (ref 0.7–4.0)
MCH: 29.8 pg (ref 26.0–34.0)
MCHC: 31.6 g/dL (ref 30.0–36.0)
MCV: 94.4 fL (ref 80.0–100.0)
Monocytes Absolute: 0.1 10*3/uL (ref 0.1–1.0)
Monocytes Relative: 8 %
Neutro Abs: 0.2 10*3/uL — CL (ref 1.7–7.7)
Neutrophils Relative %: 19 %
Platelets: 11 10*3/uL — CL (ref 150–400)
RBC: 3.05 MIL/uL — ABNORMAL LOW (ref 3.87–5.11)
RDW: 14 % (ref 11.5–15.5)
WBC: 1 10*3/uL — CL (ref 4.0–10.5)
nRBC: 0 % (ref 0.0–0.2)

## 2022-12-13 LAB — MAGNESIUM: Magnesium: 1.7 mg/dL (ref 1.7–2.4)

## 2022-12-13 LAB — BASIC METABOLIC PANEL
Anion gap: 10 (ref 5–15)
BUN: 13 mg/dL (ref 8–23)
CO2: 26 mmol/L (ref 22–32)
Calcium: 7.3 mg/dL — ABNORMAL LOW (ref 8.9–10.3)
Chloride: 107 mmol/L (ref 98–111)
Creatinine, Ser: 1.13 mg/dL — ABNORMAL HIGH (ref 0.44–1.00)
GFR, Estimated: 50 mL/min — ABNORMAL LOW (ref >60)
Glucose, Bld: 95 mg/dL (ref 70–99)
Potassium: 3.4 mmol/L — ABNORMAL LOW (ref 3.5–5.1)
Sodium: 143 mmol/L (ref 135–145)

## 2022-12-13 LAB — CULTURE, BLOOD (ROUTINE X 2)
Culture: NO GROWTH
Culture: NO GROWTH
Special Requests: ADEQUATE
Special Requests: ADEQUATE

## 2022-12-13 MED ORDER — OXYCODONE HCL 5 MG PO TABS
5.0000 mg | ORAL_TABLET | Freq: Four times a day (QID) | ORAL | Status: DC
  Administered 2022-12-13 – 2022-12-18 (×19): 5 mg via ORAL
  Filled 2022-12-13 (×19): qty 1
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
## 2022-12-13 NOTE — Progress Notes (Addendum)
PROGRESS NOTE    Crystal [name]  EGB:[id number] DOB: 1946-03-12 DOA: 12/08/2022 PCP: Gaynelle Arabian, MD     Brief Narrative:  Crystal [name] is a 77 y.o. female with medical history significant for pulmonary embolism on Eliquis, non-small cell lung cancer who just completed cycle 1 of carboplatin/Alimta/Pembrolizumab 11/29/2022 under the care of Dr. Benay Spice and is being admitted to the hospital with neutropenia and skin breakdown.  Unfortunately on 12/03/2022 she started to develop sores and diarrhea unable to take anything by mouth due to severe pain.  She also developed a rash in the lower abdomen, which looks significantly worse today, with increasing erythema, and some bloody drainage intermittently.  Oncology recommended admission for supportive care with IV fluids, and empiric IV antibiotics given her neutropenic status.    Hospital course complicated by severe pancytopenia, chemotherapy-induced.  On G CSF and prophylactic IV antibiotics.  New events last 24 hours / Subjective: Feeling well overall. No report of blood loss. Poor appetite, mucositis slowly improving.   Assessment & Plan:   Principal Problem:   Neutropenia ([place]) Active Problems:   Essential hypertension   Dyslipidemia, goal LDL below 100   Nonischemic cardiomyopathy (HCC)   Hypokalemia   Metastatic non-small cell lung cancer (HCC)   Malignant pleural effusion   CKD (chronic kidney disease), stage III (HCC)   Chronic HFrEF (heart failure with reduced ejection fraction) (HCC)   Cellulitis   Chemotherapy-induced neutropenia -Continue Rocephin and vancomycin -Blood culture negative  -Continue G-CSF -Appreciate oncology    Severe pancytopenia secondary to chemotherapy  -Continue G-CSF -Home Eliquis stopped -Appreciate oncology    Persistent small right pleural effusion with new small amount of loculated fluid in the fissure -Right-sided chest tube in place    Mucositis -Severe mouth sores and severe rash  in her groin area -Continue Magic mouthwash with lidocaine.  Finished Diflucan 100 mg x 2 -Continue nystatin powder, hydrocortisone cream -Continue supportive care   Hypertension -Metoprolol   Hypokalemia -Replace     CKD stage IIIa -Stable    Metastatic non-small cell lung cancer -Diagnosed in November 2023.  Underwent radiation therapy in December and currently on chemo. -Followed by oncology   History of recurrent PE -Eliquis is stopped due to severe thrombocytopenia   Chronic HFrEF -Echo from 11/12/2022 ejection fraction of 45 to 50% with grade 1 diastolic dysfunction -Stable    Anxiety -Continue Ativan as needed   Morbid obesity -BMI of 35     DVT prophylaxis: Eliquis on hold    Code Status: DNR Family Communication: niece over the phone  Disposition Plan:  Status is: Inpatient Remains inpatient appropriate because: pancytopenia   Consultants:  Oncology   Procedures:  None   Antimicrobials:  Anti-infectives (From admission, onward)    Start     Dose/Rate Route Frequency Ordered Stop   12/10/22 1315  vancomycin (VANCOREADY) IVPB 750 mg/150 mL        750 mg 150 mL/hr over 60 Minutes Intravenous Every 24 hours 12/10/22 1309     12/09/22 1000  fluconazole (DIFLUCAN) tablet 100 mg        100 mg Oral Daily 12/08/22 1441 12/10/22 0913   12/08/22 1600  cefTRIAXone (ROCEPHIN) 2 g in sodium chloride 0.9 % 100 mL IVPB        2 g 200 mL/hr over 30 Minutes Intravenous Every 24 hours 12/08/22 1441     12/08/22 1600  vancomycin (VANCOREADY) IVPB 1250 mg/250 mL  Status:  Discontinued  1,250 mg 166.7 mL/hr over 90 Minutes Intravenous Every 48 hours 12/08/22 1450 12/10/22 1309        Objective: Vitals:   12/12/22 0838 12/12/22 1340 12/12/22 2100 12/13/22 0552  BP: 126/75 132/89 (!) 151/98 (!) 143/79  Pulse: (!) 105 98 (!) 110 (!) 102  Resp:  18 18 16   Temp:  97.9 F (36.6 C) 98.1 F (36.7 C) 98 F (36.7 C)  TempSrc:  Oral Oral Oral  SpO2:  93%  99% 98%  Weight:      Height:        Intake/Output Summary (Last 24 hours) at 12/13/2022 1315 Last data filed at 12/13/2022 0900 Gross per 24 hour  Intake 1760 ml  Output --  Net 1760 ml   Filed Weights   12/08/22 1500  Weight: 95.7 kg    Examination:  General exam: Appears calm and comfortable  Respiratory system: Clear to auscultation. Respiratory effort normal. No respiratory distress. No conversational dyspnea.  Cardiovascular system: S1 & S2 heard, RRR. No murmurs. No pedal edema. Gastrointestinal system: Abdomen is nondistended, soft and nontender. Normal bowel sounds heard. Central nervous system: Alert and oriented. No focal neurological deficits. Speech clear.  Extremities: Symmetric in appearance  Skin: No rashes, lesions or ulcers on exposed skin  Psychiatry: Judgement and insight appear normal. Mood & affect appropriate.   Data Reviewed: I have personally reviewed following labs and imaging studies  CBC: Recent Labs  Lab 12/09/22 0600 12/10/22 0500 12/11/22 0500 12/11/22 1027 12/12/22 0635 12/13/22 0555  WBC 0.8* 0.6* 0.8*  --  0.7* 1.0*  NEUTROABS 0.1* 0.0* 0.0*  --  0.1* 0.2*  HGB 9.0* 9.1* 9.0*  --  8.9* 9.1*  HCT 29.3* 29.6* 29.2*  --  28.2* 28.8*  MCV 95.8 95.5 94.8  --  93.7 94.4  PLT 32* 16* 6* 31* 21* 11*   Basic Metabolic Panel: Recent Labs  Lab 12/08/22 0843 12/09/22 0600 12/10/22 0500 12/11/22 0500 12/12/22 0635 12/13/22 0555  NA 143 144 143 141 143 143  K 3.5 3.3* 3.2* 3.3* 3.3* 3.4*  CL 108 109 109 108 107 107  CO2 28 26 25 25 27 26   GLUCOSE 129* 103* 103* 104* 94 95  BUN 23 18 18 15 14 13   CREATININE 1.55* 1.45* 1.17* 1.26* 1.18* 1.13*  CALCIUM 8.0* 7.2* 7.1* 7.0* 7.1* 7.3*  MG 1.8 1.5*  --  1.8 1.6* 1.7   GFR: Estimated Creatinine Clearance: 47.7 mL/min (A) (by C-G formula based on SCr of 1.13 mg/dL (H)). Liver Function Tests: Recent Labs  Lab 12/08/22 0843 12/10/22 0500 12/12/22 0635  AST 15 17 14*  ALT 9 11 10    ALKPHOS 43 41 38  BILITOT 0.6 0.6 0.5  PROT 5.8* 5.5* 5.2*  ALBUMIN 2.9* 2.1* 2.0*   No results for input(s): "LIPASE", "AMYLASE" in the last 168 hours. No results for input(s): "AMMONIA" in the last 168 hours. Coagulation Profile: No results for input(s): "INR", "PROTIME" in the last 168 hours. Cardiac Enzymes: No results for input(s): "CKTOTAL", "CKMB", "CKMBINDEX", "TROPONINI" in the last 168 hours. BNP (last 3 results) No results for input(s): "PROBNP" in the last 8760 hours. HbA1C: No results for input(s): "HGBA1C" in the last 72 hours. CBG: No results for input(s): "GLUCAP" in the last 168 hours. Lipid Profile: No results for input(s): "CHOL", "HDL", "LDLCALC", "TRIG", "CHOLHDL", "LDLDIRECT" in the last 72 hours. Thyroid Function Tests: No results for input(s): "TSH", "T4TOTAL", "FREET4", "T3FREE", "THYROIDAB" in the last 72 hours. Anemia Panel:  No results for input(s): "VITAMINB12", "FOLATE", "FERRITIN", "TIBC", "IRON", "RETICCTPCT" in the last 72 hours. Sepsis Labs: Recent Labs  Lab 12/08/22 1531 12/08/22 2225  LATICACIDVEN 1.2 0.8    Recent Results (from the past 240 hour(s))  Culture, blood (Routine X 2) w Reflex to ID Panel     Status: None   Collection Time: 12/08/22  3:31 PM   Specimen: BLOOD LEFT ARM  Result Value Ref Range Status   Specimen Description   Final    BLOOD LEFT ARM Performed at [facility], [place] [address], [place], [place] [address]    Special Requests   Final    BOTTLES DRAWN AEROBIC ONLY Blood Culture adequate volume Performed at [facility] [address] [place], [place] [address]    Culture   Final    NO GROWTH 5 DAYS Performed at [facility], [place] [address] [place], [place] [address]    Report Status 12/13/2022 FINAL  Final  Culture, blood (Routine X 2) w Reflex to ID Panel     Status: None   Collection Time: 12/08/22  3:35 PM   Specimen: BLOOD  Result Value Ref Range Status   Specimen  Description   Final    BLOOD AEROBIC BOTTLE ONLY Performed at [facility] [address], [place], [place] [address]    Special Requests   Final    BOTTLES DRAWN AEROBIC ONLY Blood Culture adequate volume Performed at [facility] [address], [place], [place] [address]    Culture   Final    NO GROWTH 5 DAYS Performed at [facility], [place] [address], [place],  [address]    Report Status 12/13/2022 FINAL  Final      Radiology Studies: DG CHEST PORT 1 VIEW  Result Date: 12/12/2022 CLINICAL DATA:  Right chest tube dysfunction. EXAM: PORTABLE CHEST 1 VIEW COMPARISON:  Chest x-ray dated November 16, 2022. FINDINGS: Unchanged right chest wall port catheter and right-sided chest tube. The heart size and mediastinal contours are within normal limits. Persistent small right pleural effusion with new small amount of loculated fluid in the fissure. Increased mild right basilar atelectasis. Known right middle lobe mass is grossly unchanged. The left lung is clear. No pneumothorax. No acute osseous abnormality. IMPRESSION: 1. Persistent small right pleural effusion with new small amount of loculated fluid in the fissure. Right-sided chest tube is unchanged. Electronically Signed   By: Titus Dubin M.D.   On: 12/12/2022 12:45      Scheduled Meds:  Chlorhexidine Gluconate Cloth  6 each Topical Daily   docusate sodium  100 mg Oral BID   feeding supplement  237 mL Oral BID BM   folic acid  1 mg Oral Daily   metoprolol tartrate  100 mg Oral BID   multivitamin with minerals  1 tablet Oral Daily   nystatin  1 Application Topical TID   Tbo-filgastrim (GRANIX) SQ  480 mcg Subcutaneous q1800   Continuous Infusions:  0.9 % NaCl with KCl 40 mEq / L 75 mL/hr at 12/13/22 1217   cefTRIAXone (ROCEPHIN)  IV 2 g (12/12/22 1634)   vancomycin 750 mg (12/12/22 1504)     LOS: 5 days   Time spent: 25 minutes   Dessa Phi, DO [facility] 12/13/2022, 1:15 PM   Available via Epic secure chat 7am-7pm After these hours, please refer to coverage provider listed on [email or web address]

## 2022-12-13 NOTE — Progress Notes (Signed)
Pharmacy Antibiotic Note  Crystal [name] is a 77 y.o. female admitted on 12/08/2022 with neutropenia, concern for cellulitis. Pharmacy has been consulted for Vancomycin dosing.  Plan: Ceftriaxone per MD Continue Vancomycin 750mg  IV q24h Measure Vancomycin levels as needed. Goal AUC = 400 - 550  Follow up renal function, culture results, clinical course, duration of therapy.   Height: 5\' 5"  (165.1 cm) Weight: 95.7 kg (211 lb) IBW/kg (Calculated) : 57  Temp (24hrs), Avg:98.1 F (36.7 C), Min:98 F (36.7 C), Max:98.3 F (36.8 C)  Recent Labs  Lab 12/08/22 1531 12/08/22 2225 12/09/22 0600 12/10/22 0500 12/11/22 0500 12/12/22 0635 12/13/22 0555  WBC  --   --  0.8* 0.6* 0.8* 0.7* 1.0*  CREATININE  --   --  1.45* 1.17* 1.26* 1.18* 1.13*  LATICACIDVEN 1.2 0.8  --   --   --   --   --      Estimated Creatinine Clearance: 47.7 mL/min (A) (by C-G formula based on SCr of 1.13 mg/dL (H)).    Allergies  Allergen Reactions   Simvastatin Other (See Comments)    Leg cramps   Zoloft [Sertraline] Nausea Only   Codeine Other (See Comments)    Bad Headaches   Coreg Other (See Comments)    "Made my legs hurt"   Lisinopril Cough   Tizanidine Hcl Rash and Other (See Comments)    Hypotension, also    Antimicrobials this admission: 2/16 Vanc >> 2/16 Ceftriaxone >> 2/17 Fluconazole >> 2/18  Microbiology results: 2/16 BCx: NGF  Thank you for allowing pharmacy to be a part of this patient's care.  Lindell Spar, PharmD, BCPS Clinical Pharmacist 12/13/2022 2:20 PM

## 2022-12-14 ENCOUNTER — Encounter: Payer: Self-pay | Admitting: *Deleted

## 2022-12-14 DIAGNOSIS — C3490 Malignant neoplasm of unspecified part of unspecified bronchus or lung: Secondary | ICD-10-CM | POA: Diagnosis not present

## 2022-12-14 DIAGNOSIS — D701 Agranulocytosis secondary to cancer chemotherapy: Secondary | ICD-10-CM | POA: Diagnosis not present

## 2022-12-14 DIAGNOSIS — T451X5A Adverse effect of antineoplastic and immunosuppressive drugs, initial encounter: Secondary | ICD-10-CM | POA: Diagnosis not present

## 2022-12-14 LAB — MAGNESIUM: Magnesium: 1.6 mg/dL — ABNORMAL LOW (ref 1.7–2.4)

## 2022-12-14 LAB — CBC WITH DIFFERENTIAL/PLATELET
Abs Immature Granulocytes: 0 10*3/uL (ref 0.00–0.07)
Abs Immature Granulocytes: 0.22 10*3/uL — ABNORMAL HIGH (ref 0.00–0.07)
Basophils Absolute: 0 10*3/uL (ref 0.0–0.1)
Basophils Absolute: 0 10*3/uL (ref 0.0–0.1)
Basophils Relative: 0 %
Basophils Relative: 1 %
Eosinophils Absolute: 0 10*3/uL (ref 0.0–0.5)
Eosinophils Absolute: 0 10*3/uL (ref 0.0–0.5)
Eosinophils Relative: 0 %
Eosinophils Relative: 0 %
HCT: 28.2 % — ABNORMAL LOW (ref 36.0–46.0)
HCT: 27.4 % — ABNORMAL LOW (ref 36.0–46.0)
Hemoglobin: 8.9 g/dL — ABNORMAL LOW (ref 12.0–15.0)
Hemoglobin: 8.8 g/dL — ABNORMAL LOW (ref 12.0–15.0)
Immature Granulocytes: 0 %
Immature Granulocytes: 10 %
Lymphocytes Relative: 84 %
Lymphocytes Relative: 46 %
Lymphs Abs: 0.6 10*3/uL — ABNORMAL LOW (ref 0.7–4.0)
Lymphs Abs: 1 10*3/uL (ref 0.7–4.0)
MCH: 29.6 pg (ref 26.0–34.0)
MCH: 30 pg (ref 26.0–34.0)
MCHC: 31.6 g/dL (ref 30.0–36.0)
MCHC: 32.1 g/dL (ref 30.0–36.0)
MCV: 93.7 fL (ref 80.0–100.0)
MCV: 93.5 fL (ref 80.0–100.0)
Monocytes Absolute: 0.1 10*3/uL (ref 0.1–1.0)
Monocytes Absolute: 0.2 10*3/uL (ref 0.1–1.0)
Monocytes Relative: 7 %
Monocytes Relative: 7 %
Neutro Abs: 0.1 10*3/uL — CL (ref 1.7–7.7)
Neutro Abs: 0.8 10*3/uL — ABNORMAL LOW (ref 1.7–7.7)
Neutrophils Relative %: 9 %
Neutrophils Relative %: 36 %
Platelets: 21 10*3/uL — CL (ref 150–400)
Platelets: 5 10*3/uL — CL (ref 150–400)
RBC: 3.01 MIL/uL — ABNORMAL LOW (ref 3.87–5.11)
RBC: 2.93 MIL/uL — ABNORMAL LOW (ref 3.87–5.11)
RDW: 14.1 % (ref 11.5–15.5)
RDW: 13.9 % (ref 11.5–15.5)
WBC: 0.7 10*3/uL — CL (ref 4.0–10.5)
WBC: 2.2 10*3/uL — ABNORMAL LOW (ref 4.0–10.5)
nRBC: 0 % (ref 0.0–0.2)
nRBC: 0 % (ref 0.0–0.2)

## 2022-12-14 LAB — BASIC METABOLIC PANEL
Anion gap: 7 (ref 5–15)
BUN: 12 mg/dL (ref 8–23)
CO2: 26 mmol/L (ref 22–32)
Calcium: 7 mg/dL — ABNORMAL LOW (ref 8.9–10.3)
Chloride: 107 mmol/L (ref 98–111)
Creatinine, Ser: 1.14 mg/dL — ABNORMAL HIGH (ref 0.44–1.00)
GFR, Estimated: 50 mL/min — ABNORMAL LOW (ref >60)
Glucose, Bld: 91 mg/dL (ref 70–99)
Potassium: 3.5 mmol/L (ref 3.5–5.1)
Sodium: 140 mmol/L (ref 135–145)

## 2022-12-14 MED ORDER — MAGNESIUM SULFATE 2 GM/50ML IV SOLN
2.0000 g | Freq: Once | INTRAVENOUS | Status: AC
  Administered 2022-12-14: 2 g via INTRAVENOUS
  Filled 2022-12-14: qty 50

## 2022-12-14 MED ORDER — POLYVINYL ALCOHOL 1.4 % OP SOLN
1.0000 [drp] | OPHTHALMIC | Status: DC | PRN
  Administered 2022-12-14: 1 [drp] via OPHTHALMIC
  Filled 2022-12-14: qty 15

## 2022-12-14 MED ORDER — SODIUM CHLORIDE 0.9% IV SOLUTION: Freq: Once | INTRAVENOUS | Status: AC

## 2022-12-14 MED ORDER — VANCOMYCIN HCL 750 MG/150ML IV SOLN
750.0000 mg | INTRAVENOUS | Status: AC
  Administered 2022-12-14: 750 mg via INTRAVENOUS
  Filled 2022-12-14: qty 150

## 2022-12-14 NOTE — Plan of Care (Signed)
Patient AOX4, VSS throughout shift.  All meds given on time as ordered.  Diminished lungs, IS encouraged.  Pt standby assist to bathroom.  Purple/reddish rash on bilateral arms and chest d/t past chemo treatments.  POC maintained, will continue to monitor.  Problem: Education: Goal: Knowledge of General Education information will improve Description: Including pain rating scale, medication(s)/side effects and non-pharmacologic comfort measures Outcome: Progressing   Problem: Health Behavior/Discharge Planning: Goal: Ability to manage health-related needs will improve Outcome: Progressing   Problem: Clinical Measurements: Goal: Ability to maintain clinical measurements within normal limits will improve Outcome: Progressing Goal: Will remain free from infection Outcome: Progressing Goal: Diagnostic test results will improve Outcome: Progressing Goal: Respiratory complications will improve Outcome: Progressing Goal: Cardiovascular complication will be avoided Outcome: Progressing   Problem: Activity: Goal: Risk for activity intolerance will decrease Outcome: Progressing   Problem: Nutrition: Goal: Adequate nutrition will be maintained Outcome: Progressing   Problem: Coping: Goal: Level of anxiety will decrease Outcome: Progressing   Problem: Elimination: Goal: Will not experience complications related to bowel motility Outcome: Progressing Goal: Will not experience complications related to urinary retention Outcome: Progressing   Problem: Pain Managment: Goal: General experience of comfort will improve Outcome: Progressing   Problem: Safety: Goal: Ability to remain free from injury will improve Outcome: Progressing   Problem: Skin Integrity: Goal: Risk for impaired skin integrity will decrease Outcome: Progressing

## 2022-12-14 NOTE — Care Management Important Message (Signed)
Important Message  Patient Details IM Letter given Name: Crystal [name] MRN: [id number] Date of Birth: Jun 13, 1946   Medicare Important Message Given:  Yes     Kerin Salen 12/14/2022, 10:02 AM

## 2022-12-14 NOTE — Progress Notes (Signed)
Oncology Discharge Planning Note  [facility] Address: [address] [place], [place], [place] [address] Hours of Operation:  Nena Polio, Monday - Friday  Clinic Contact Information:  [phone]  Oncology Care Team: Medical Oncologist:  Benay Spice  Patient Details: Name:  Crystal [name], Crystal [name] MRN:   [id number] DOB:   12-14-1945 Reason for Current Admission: @PPROB @  Discharge Planning Narrative: Notification of admission received by Inpatient team for Crystal [name].  Discharge follow-up appointments for oncology are current and available on the AVS and MyChart.   Upon discharge from the hospital, hematology/oncology's post discharge plan of care for the outpatient setting is: December 20, 2022 at 8:50am [facility] at [place], [place] [address] [phone]   Dorothy Polhemus will be called within two business days after discharge to review hematology/oncology's plan of care for full understanding.    Outpatient Oncology Specific Care Only: Oncology appointment transportation needs addressed?:  yes Oncology medication management for symptom management addressed?:  yes Chemo Alert Card reviewed?:  yes Immunotherapy Alert Card reviewed?:  yes
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
## 2022-12-14 NOTE — Progress Notes (Signed)
PROGRESS NOTE    Crystal [name]  GQQ:[id number] DOB: 10/31/1945 DOA: 12/08/2022 PCP: Gaynelle Arabian, MD     Brief Narrative:  Crystal [name] is a 77 y.o. female with medical history significant for pulmonary embolism on Eliquis, non-small cell lung cancer who just completed cycle 1 of carboplatin/Alimta/Pembrolizumab 11/29/2022 under the care of Dr. Benay Spice and is being admitted to the hospital with neutropenia and skin breakdown.  Unfortunately on 12/03/2022 she started to develop sores and diarrhea unable to take anything by mouth due to severe pain.  She also developed a rash in the lower abdomen, which looks significantly worse today, with increasing erythema, and some bloody drainage intermittently.  Oncology recommended admission for supportive care with IV fluids, and empiric IV antibiotics given her neutropenic status.    Hospital course complicated by severe pancytopenia, chemotherapy-induced.  On G CSF and prophylactic IV antibiotics.  New events last 24 hours / Subjective: Feeling a lot better today. Sitting in recliner. Still issues with mucositis.   Assessment & Plan:   Principal Problem:   Neutropenia ([place]) Active Problems:   Essential hypertension   Dyslipidemia, goal LDL below 100   Nonischemic cardiomyopathy (HCC)   Hypokalemia   Metastatic non-small cell lung cancer (HCC)   Malignant pleural effusion   CKD (chronic kidney disease), stage III (HCC)   Chronic HFrEF (heart failure with reduced ejection fraction) (HCC)   Cellulitis   Chemotherapy-induced neutropenia -Rocephin and vancomycin, today is day 7, last day today  -Blood culture negative  -Continue G-CSF -Appreciate oncology    Severe pancytopenia secondary to chemotherapy  -Continue G-CSF -Home Eliquis stopped -Appreciate oncology  -Transfuse platelet today    Persistent small right pleural effusion with new small amount of loculated fluid in the fissure -Right-sided chest tube in place, output  215ml today. Fluid to be drawn again tomorrow and sent to lab   Mucositis -Severe mouth sores and severe rash in her groin area -Continue Magic mouthwash with lidocaine.  Finished Diflucan 100 mg x 2 -Continue nystatin powder, hydrocortisone cream -Continue supportive care   Hypertension -Metoprolol   Hypomagnesemia  -Replace     CKD stage IIIa -Stable    Metastatic non-small cell lung cancer -Diagnosed in November 2023.  Underwent radiation therapy in December and currently on chemo. -Followed by oncology   History of recurrent PE -Eliquis is stopped due to severe thrombocytopenia   Chronic HFrEF -Echo from 11/12/2022 ejection fraction of 45 to 50% with grade 1 diastolic dysfunction -Stable    Anxiety -Continue Ativan as needed   Morbid obesity -BMI of 35     DVT prophylaxis: Eliquis on hold   Code Status: DNR Family Communication: No family at bedside  Disposition Plan:  Status is: Inpatient Remains inpatient appropriate because: pancytopenia   Consultants:  Oncology   Procedures:  None   Antimicrobials:  Anti-infectives (From admission, onward)    Start     Dose/Rate Route Frequency Ordered Stop   12/10/22 1315  vancomycin (VANCOREADY) IVPB 750 mg/150 mL        750 mg 150 mL/hr over 60 Minutes Intravenous Every 24 hours 12/10/22 1309     12/09/22 1000  fluconazole (DIFLUCAN) tablet 100 mg        100 mg Oral Daily 12/08/22 1441 12/10/22 0913   12/08/22 1600  cefTRIAXone (ROCEPHIN) 2 g in sodium chloride 0.9 % 100 mL IVPB        2 g 200 mL/hr over 30 Minutes Intravenous Every 24 hours  12/08/22 1441     12/08/22 1600  vancomycin (VANCOREADY) IVPB 1250 mg/250 mL  Status:  Discontinued        1,250 mg 166.7 mL/hr over 90 Minutes Intravenous Every 48 hours 12/08/22 1450 12/10/22 1309        Objective: Vitals:   12/13/22 2058 12/14/22 0433 12/14/22 1017 12/14/22 1045  BP: 127/82 (!) 145/85 (!) 136/91 134/79  Pulse: (!) 108 (!) 104 (!) 105 92   Resp: 18 16 18 18   Temp: 98.9 F (37.2 C) 98 F (36.7 C) (!) 97.5 F (36.4 C) 97.7 F (36.5 C)  TempSrc: Oral Axillary Oral   SpO2: 95% 95%    Weight:      Height:        Intake/Output Summary (Last 24 hours) at 12/14/2022 1158 Last data filed at 12/14/2022 0900 Gross per 24 hour  Intake 1965.13 ml  Output 250 ml  Net 1715.13 ml    Filed Weights   12/08/22 1500  Weight: 95.7 kg    Examination:  General exam: Appears calm and comfortable  Respiratory system: Clear to auscultation. Respiratory effort normal. No respiratory distress. No conversational dyspnea.  Cardiovascular system: S1 & S2 heard, RRR. No murmurs. No pedal edema. Gastrointestinal system: Abdomen is nondistended, soft and nontender. Normal bowel sounds heard. Central nervous system: Alert and oriented. No focal neurological deficits. Speech clear.  Extremities: Symmetric in appearance  Skin: No rashes, lesions or ulcers on exposed skin  Psychiatry: Judgement and insight appear normal. Mood & affect appropriate.   Data Reviewed: I have personally reviewed following labs and imaging studies  CBC: Recent Labs  Lab 12/10/22 0500 12/11/22 0500 12/11/22 1027 12/12/22 0635 12/13/22 0555 12/14/22 0450  WBC 0.6* 0.8*  --  0.7* 1.0* 2.2*  NEUTROABS 0.0* 0.0*  --  0.1* 0.2* 0.8*  HGB 9.1* 9.0*  --  8.9* 9.1* 8.8*  HCT 29.6* 29.2*  --  28.2* 28.8* 27.4*  MCV 95.5 94.8  --  93.7 94.4 93.5  PLT 16* 6* 31* 21* 11* 5*    Basic Metabolic Panel: Recent Labs  Lab 12/09/22 0600 12/10/22 0500 12/11/22 0500 12/12/22 0635 12/13/22 0555 12/14/22 0450  NA 144 143 141 143 143 140  K 3.3* 3.2* 3.3* 3.3* 3.4* 3.5  CL 109 109 108 107 107 107  CO2 26 25 25 27 26 26   GLUCOSE 103* 103* 104* 94 95 91  BUN 18 18 15 14 13 12   CREATININE 1.45* 1.17* 1.26* 1.18* 1.13* 1.14*  CALCIUM 7.2* 7.1* 7.0* 7.1* 7.3* 7.0*  MG 1.5*  --  1.8 1.6* 1.7 1.6*    GFR: Estimated Creatinine Clearance: 47.3 mL/min (A) (by C-G formula  based on SCr of 1.14 mg/dL (H)). Liver Function Tests: Recent Labs  Lab 12/08/22 0843 12/10/22 0500 12/12/22 0635  AST 15 17 14*  ALT 9 11 10   ALKPHOS 43 41 38  BILITOT 0.6 0.6 0.5  PROT 5.8* 5.5* 5.2*  ALBUMIN 2.9* 2.1* 2.0*    No results for input(s): "LIPASE", "AMYLASE" in the last 168 hours. No results for input(s): "AMMONIA" in the last 168 hours. Coagulation Profile: No results for input(s): "INR", "PROTIME" in the last 168 hours. Cardiac Enzymes: No results for input(s): "CKTOTAL", "CKMB", "CKMBINDEX", "TROPONINI" in the last 168 hours. BNP (last 3 results) No results for input(s): "PROBNP" in the last 8760 hours. HbA1C: No results for input(s): "HGBA1C" in the last 72 hours. CBG: No results for input(s): "GLUCAP" in the last 168 hours. Lipid  Profile: No results for input(s): "CHOL", "HDL", "LDLCALC", "TRIG", "CHOLHDL", "LDLDIRECT" in the last 72 hours. Thyroid Function Tests: No results for input(s): "TSH", "T4TOTAL", "FREET4", "T3FREE", "THYROIDAB" in the last 72 hours. Anemia Panel: No results for input(s): "VITAMINB12", "FOLATE", "FERRITIN", "TIBC", "IRON", "RETICCTPCT" in the last 72 hours. Sepsis Labs: Recent Labs  Lab 12/08/22 1531 12/08/22 2225  LATICACIDVEN 1.2 0.8     Recent Results (from the past 240 hour(s))  Culture, blood (Routine X 2) w Reflex to ID Panel     Status: None   Collection Time: 12/08/22  3:31 PM   Specimen: BLOOD LEFT ARM  Result Value Ref Range Status   Specimen Description   Final    BLOOD LEFT ARM Performed at [facility], [place] [address], [place], [place] [address]    Special Requests   Final    BOTTLES DRAWN AEROBIC ONLY Blood Culture adequate volume Performed at [facility] [address] [place], [place] [address]    Culture   Final    NO GROWTH 5 DAYS Performed at [facility], [place] [employer] [address], [place], [place] [address]    Report Status 12/13/2022 FINAL  Final  Culture, blood  (Routine X 2) w Reflex to ID Panel     Status: None   Collection Time: 12/08/22  3:35 PM   Specimen: BLOOD  Result Value Ref Range Status   Specimen Description   Final    BLOOD AEROBIC BOTTLE ONLY Performed at [facility] [address], [place], [place] [address]    Special Requests   Final    BOTTLES DRAWN AEROBIC ONLY Blood Culture adequate volume Performed at [facility] [address], [place], [place] [address]    Culture   Final    NO GROWTH 5 DAYS Performed at [facility], [place] [address] [place], [place] [address]    Report Status 12/13/2022 FINAL  Final      Radiology Studies: DG CHEST PORT 1 VIEW  Result Date: 12/12/2022 CLINICAL DATA:  Right chest tube dysfunction. EXAM: PORTABLE CHEST 1 VIEW COMPARISON:  Chest x-ray dated November 16, 2022. FINDINGS: Unchanged right chest wall port catheter and right-sided chest tube. The heart size and mediastinal contours are within normal limits. Persistent small right pleural effusion with new small amount of loculated fluid in the fissure. Increased mild right basilar atelectasis. Known right middle lobe mass is grossly unchanged. The left lung is clear. No pneumothorax. No acute osseous abnormality. IMPRESSION: 1. Persistent small right pleural effusion with new small amount of loculated fluid in the fissure. Right-sided chest tube is unchanged. Electronically Signed   By: Titus Dubin M.D.   On: 12/12/2022 12:45      Scheduled Meds:  Chlorhexidine Gluconate Cloth  6 each Topical Daily   docusate sodium  100 mg Oral BID   feeding supplement  237 mL Oral BID BM   folic acid  1 mg Oral Daily   metoprolol tartrate  100 mg Oral BID   multivitamin with minerals  1 tablet Oral Daily   nystatin  1 Application Topical TID   oxyCODONE  5 mg Oral Q6H   Tbo-filgastrim (GRANIX) SQ  480 mcg Subcutaneous q1800   Continuous Infusions:  0.9 % NaCl with KCl 40 mEq / L 20 mL/hr at 12/14/22  0823   cefTRIAXone (ROCEPHIN)  IV Stopped (12/13/22 1649)   vancomycin Stopped (12/13/22 1559)     LOS: 6 days  Time spent: 25 minutes   Dessa Phi, DO [facility] 12/14/2022, 11:58 AM   Available via Epic secure chat 7am-7pm After these hours, please refer to coverage provider listed on [email or web address]

## 2022-12-15 DIAGNOSIS — C3490 Malignant neoplasm of unspecified part of unspecified bronchus or lung: Secondary | ICD-10-CM | POA: Diagnosis not present

## 2022-12-15 DIAGNOSIS — T451X5A Adverse effect of antineoplastic and immunosuppressive drugs, initial encounter: Secondary | ICD-10-CM | POA: Diagnosis not present

## 2022-12-15 DIAGNOSIS — D701 Agranulocytosis secondary to cancer chemotherapy: Secondary | ICD-10-CM | POA: Diagnosis not present

## 2022-12-15 LAB — PREPARE PLATELET PHERESIS: Unit division: 0

## 2022-12-15 LAB — CBC WITH DIFFERENTIAL/PLATELET
Abs Immature Granulocytes: 0.44 10*3/uL — ABNORMAL HIGH (ref 0.00–0.07)
Basophils Absolute: 0 10*3/uL (ref 0.0–0.1)
Basophils Relative: 1 %
Eosinophils Absolute: 0 10*3/uL (ref 0.0–0.5)
Eosinophils Relative: 0 %
HCT: 25.6 % — ABNORMAL LOW (ref 36.0–46.0)
Hemoglobin: 8.1 g/dL — ABNORMAL LOW (ref 12.0–15.0)
Immature Granulocytes: 10 %
Lymphocytes Relative: 26 %
Lymphs Abs: 1.1 10*3/uL (ref 0.7–4.0)
MCH: 29.5 pg (ref 26.0–34.0)
MCHC: 31.6 g/dL (ref 30.0–36.0)
MCV: 93.1 fL (ref 80.0–100.0)
Monocytes Absolute: 0.3 10*3/uL (ref 0.1–1.0)
Monocytes Relative: 7 %
Neutro Abs: 2.4 10*3/uL (ref 1.7–7.7)
Neutrophils Relative %: 56 %
Platelets: 22 10*3/uL — CL (ref 150–400)
RBC: 2.75 MIL/uL — ABNORMAL LOW (ref 3.87–5.11)
RDW: 14.1 % (ref 11.5–15.5)
WBC: 4.3 10*3/uL (ref 4.0–10.5)
nRBC: 0 % (ref 0.0–0.2)

## 2022-12-15 LAB — BODY FLUID CELL COUNT WITH DIFFERENTIAL
Lymphs, Fluid: 90 %
Monocyte-Macrophage-Serous Fluid: 4 % — ABNORMAL LOW (ref 50–90)
Neutrophil Count, Fluid: 6 % (ref 0–25)
Total Nucleated Cell Count, Fluid: 67 cu mm (ref 0–1000)

## 2022-12-15 LAB — BASIC METABOLIC PANEL
Anion gap: 9 (ref 5–15)
BUN: 10 mg/dL (ref 8–23)
CO2: 28 mmol/L (ref 22–32)
Calcium: 6.8 mg/dL — ABNORMAL LOW (ref 8.9–10.3)
Chloride: 103 mmol/L (ref 98–111)
Creatinine, Ser: 0.5 mg/dL (ref 0.44–1.00)
GFR, Estimated: >60 mL/min (ref >60)
Glucose, Bld: 94 mg/dL (ref 70–99)
Potassium: 3.1 mmol/L — ABNORMAL LOW (ref 3.5–5.1)
Sodium: 140 mmol/L (ref 135–145)

## 2022-12-15 LAB — BPAM PLATELET PHERESIS
Blood Product Expiration Date: 202402242359
ISSUE DATE / TIME: 202402221022
Unit Type and Rh: 5100

## 2022-12-15 LAB — LACTATE DEHYDROGENASE, PLEURAL OR PERITONEAL FLUID: LD, Fluid: 163 U/L — ABNORMAL HIGH (ref 3–23)

## 2022-12-15 LAB — GLUCOSE, PLEURAL OR PERITONEAL FLUID: Glucose, Fluid: 90 mg/dL

## 2022-12-15 LAB — MAGNESIUM: Magnesium: 1.6 mg/dL — ABNORMAL LOW (ref 1.7–2.4)

## 2022-12-15 MED ORDER — POTASSIUM CHLORIDE CRYS ER 20 MEQ PO TBCR
40.0000 meq | EXTENDED_RELEASE_TABLET | Freq: Two times a day (BID) | ORAL | Status: AC
  Administered 2022-12-15 (×2): 40 meq via ORAL
  Filled 2022-12-15 (×2): qty 2

## 2022-12-15 MED ORDER — MAGNESIUM SULFATE 2 GM/50ML IV SOLN
2.0000 g | Freq: Once | INTRAVENOUS | Status: AC
  Administered 2022-12-15: 2 g via INTRAVENOUS
  Filled 2022-12-15: qty 50
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
## 2022-12-15 NOTE — Progress Notes (Signed)
PROGRESS NOTE    Crystal [name]  [id number] DOB: [birth year] DOA: 12/08/2022 PCP: Gaynelle Arabian, MD     Brief Narrative:  Crystal [name] is a 77 y.o. female with medical history significant for pulmonary embolism on Eliquis, non-small cell lung cancer who just completed cycle 1 of carboplatin/Alimta/Pembrolizumab 11/29/2022 under the care of Dr. Benay Spice and is being admitted to the hospital with neutropenia and skin breakdown.  Unfortunately on 12/03/2022 she started to develop sores and diarrhea unable to take anything by mouth due to severe pain.  She also developed a rash in the lower abdomen, which looks significantly worse today, with increasing erythema, and some bloody drainage intermittently.  Oncology recommended admission for supportive care with IV fluids, and empiric IV antibiotics given her neutropenic status.    Hospital course complicated by severe pancytopenia, chemotherapy-induced.  On G CSF and prophylactic IV antibiotics.  New events last 24 hours / Subjective: Feeling well, improving daily   Assessment & Plan:   Principal Problem:   Neutropenia (HCC) Active Problems:   Essential hypertension   Dyslipidemia, goal LDL below 100   Nonischemic cardiomyopathy (HCC)   Hypokalemia   Metastatic non-small cell lung cancer (HCC)   Malignant pleural effusion   CKD (chronic kidney disease), stage III (HCC)   Chronic HFrEF (heart failure with reduced ejection fraction) (HCC)   Cellulitis   Chemotherapy-induced neutropenia -Completed rocephin and vancomycin -Blood culture negative  -Continue G-CSF -Appreciate oncology    Severe pancytopenia secondary to chemotherapy  -Continue G-CSF -Home Eliquis stopped -Appreciate oncology    Persistent small right pleural effusion with new small amount of loculated fluid in the fissure -Right-sided chest tube in place, output 146m today - fluid sent to lab    Mucositis -Severe mouth sores and severe rash in her groin  area -Continue Magic mouthwash with lidocaine.  Finished Diflucan 100 mg x 2 -Continue nystatin powder, hydrocortisone cream -Continue supportive care   Hypertension -Metoprolol   Hypokalemia -Replace   Hypomagnesemia  -Replace     CKD stage IIIa -Stable    Metastatic non-small cell lung cancer -Diagnosed in November 2023.  Underwent radiation therapy in December and currently on chemo. -Followed by oncology   History of recurrent PE -Eliquis is stopped due to severe thrombocytopenia   Chronic HFrEF -Echo from 11/12/2022 ejection fraction of 45 to 50% with grade 1 diastolic dysfunction -Stable    Anxiety -Continue Ativan as needed   Morbid obesity -BMI of 35     DVT prophylaxis: Eliquis on hold   Code Status: DNR Family Communication: No family at bedside  Disposition Plan:  Status is: Inpatient Remains inpatient appropriate because: pancytopenia   Consultants:  Oncology   Procedures:  None   Antimicrobials:  Anti-infectives (From admission, onward)    Start     Dose/Rate Route Frequency Ordered Stop   12/14/22 1400  vancomycin (VANCOREADY) IVPB 750 mg/150 mL        750 mg 150 mL/hr over 60 Minutes Intravenous Every 24 hours 12/14/22 1206 12/14/22 1513   12/10/22 1315  vancomycin (VANCOREADY) IVPB 750 mg/150 mL  Status:  Discontinued        750 mg 150 mL/hr over 60 Minutes Intravenous Every 24 hours 12/10/22 1309 12/14/22 1206   12/09/22 1000  fluconazole (DIFLUCAN) tablet 100 mg        100 mg Oral Daily 12/08/22 1441 12/10/22 0913   12/08/22 1600  cefTRIAXone (ROCEPHIN) 2 g in sodium chloride 0.9 % 100 mL IVPB  2 g 200 mL/hr over 30 Minutes Intravenous Every 24 hours 12/08/22 1441 12/14/22 1625   12/08/22 1600  vancomycin (VANCOREADY) IVPB 1250 mg/250 mL  Status:  Discontinued        1,250 mg 166.7 mL/hr over 90 Minutes Intravenous Every 48 hours 12/08/22 1450 12/10/22 1309        Objective: Vitals:   12/14/22 1045 12/14/22 1339  12/14/22 2108 12/15/22 0621  BP: 134/79 109/63 117/73 138/81  Pulse: 92 99 (!) 106 100  Resp: '18 18 18 14  '$ Temp: 97.7 F (36.5 C) 97.7 F (36.5 C) 97.8 F (36.6 C) 97.6 F (36.4 C)  TempSrc:  Oral Oral Oral  SpO2:  94% 93% 97%  Weight:      Height:        Intake/Output Summary (Last 24 hours) at 12/15/2022 1214 Last data filed at 12/15/2022 0900 Gross per 24 hour  Intake 1503.67 ml  Output 150 ml  Net 1353.67 ml    Filed Weights   12/08/22 1500  Weight: 95.7 kg    Examination:  General exam: Appears calm and comfortable  Respiratory system: Clear to auscultation. Respiratory effort normal. No respiratory distress. No conversational dyspnea.  Cardiovascular system: S1 & S2 heard, RRR. No murmurs. No pedal edema. Gastrointestinal system: Abdomen is nondistended, soft and nontender. Normal bowel sounds heard. Central nervous system: Alert and oriented. No focal neurological deficits. Speech clear.  Extremities: Symmetric in appearance  Skin: No rashes, lesions or ulcers on exposed skin  Psychiatry: Judgement and insight appear normal. Mood & affect appropriate.   Data Reviewed: I have personally reviewed following labs and imaging studies  CBC: Recent Labs  Lab 12/11/22 0500 12/11/22 1027 12/12/22 0635 12/13/22 0555 12/14/22 0450 12/15/22 0607  WBC 0.8*  --  0.7* 1.0* 2.2* 4.3  NEUTROABS 0.0*  --  0.1* 0.2* 0.8* 2.4  HGB 9.0*  --  8.9* 9.1* 8.8* 8.1*  HCT 29.2*  --  28.2* 28.8* 27.4* 25.6*  MCV 94.8  --  93.7 94.4 93.5 93.1  PLT 6* 31* 21* 11* 5* 22*    Basic Metabolic Panel: Recent Labs  Lab 12/11/22 0500 12/12/22 0635 12/13/22 0555 12/14/22 0450 12/15/22 0607  NA 141 143 143 140 140  K 3.3* 3.3* 3.4* 3.5 3.1*  CL 108 107 107 107 103  CO2 '25 27 26 26 28  '$ GLUCOSE 104* 94 95 91 94  BUN '15 14 13 12 10  '$ CREATININE 1.26* 1.18* 1.13* 1.14* 0.50  CALCIUM 7.0* 7.1* 7.3* 7.0* 6.8*  MG 1.8 1.6* 1.7 1.6* 1.6*    GFR: Estimated Creatinine Clearance: 67.4  mL/min (by C-G formula based on SCr of 0.5 mg/dL). Liver Function Tests: Recent Labs  Lab 12/10/22 0500 12/12/22 0635  AST 17 14*  ALT 11 10  ALKPHOS 41 38  BILITOT 0.6 0.5  PROT 5.5* 5.2*  ALBUMIN 2.1* 2.0*    No results for input(s): "LIPASE", "AMYLASE" in the last 168 hours. No results for input(s): "AMMONIA" in the last 168 hours. Coagulation Profile: No results for input(s): "INR", "PROTIME" in the last 168 hours. Cardiac Enzymes: No results for input(s): "CKTOTAL", "CKMB", "CKMBINDEX", "TROPONINI" in the last 168 hours. BNP (last 3 results) No results for input(s): "PROBNP" in the last 8760 hours. HbA1C: No results for input(s): "HGBA1C" in the last 72 hours. CBG: No results for input(s): "GLUCAP" in the last 168 hours. Lipid Profile: No results for input(s): "CHOL", "HDL", "LDLCALC", "TRIG", "CHOLHDL", "LDLDIRECT" in the last 72 hours. Thyroid  Function Tests: No results for input(s): "TSH", "T4TOTAL", "FREET4", "T3FREE", "THYROIDAB" in the last 72 hours. Anemia Panel: No results for input(s): "VITAMINB12", "FOLATE", "FERRITIN", "TIBC", "IRON", "RETICCTPCT" in the last 72 hours. Sepsis Labs: Recent Labs  Lab 12/08/22 1531 12/08/22 2225  LATICACIDVEN 1.2 0.8     Recent Results (from the past 240 hour(s))  Culture, blood (Routine X 2) w Reflex to ID Panel     Status: None   Collection Time: 12/08/22  3:31 PM   Specimen: BLOOD LEFT ARM  Result Value Ref Range Status   Specimen Description   Final    BLOOD LEFT ARM Performed at [facility], [place] [address], [place], [place] [address]    Special Requests   Final    BOTTLES DRAWN AEROBIC ONLY Blood Culture adequate volume Performed at [facility] [facility] [address] [place], [place] [address]    Culture   Final    NO GROWTH 5 DAYS Performed at [facility], [place] [address], [place], [place] [address]    Report Status 12/13/2022 FINAL  Final  Culture, blood (Routine X 2) w Reflex  to ID Panel     Status: None   Collection Time: 12/08/22  3:35 PM   Specimen: BLOOD  Result Value Ref Range Status   Specimen Description   Final    BLOOD AEROBIC BOTTLE ONLY Performed at [place] [address], [place], [place] [address]    Special Requests   Final    BOTTLES DRAWN AEROBIC ONLY Blood Culture adequate volume Performed at [facility] [address] [place], [place] [address]    Culture   Final    NO GROWTH 5 DAYS Performed at [facility], [place] [address], [place],  [address]    Report Status 12/13/2022 FINAL  Final      Radiology Studies: No results found.    Scheduled Meds:  Chlorhexidine Gluconate Cloth  6 each Topical Daily   docusate sodium  100 mg Oral BID   feeding supplement  237 mL Oral BID BM   folic acid  1 mg Oral Daily   metoprolol tartrate  100 mg Oral BID   multivitamin with minerals  1 tablet Oral Daily   nystatin  1 Application Topical TID   oxyCODONE  5 mg Oral Q6H   potassium chloride  40 mEq Oral BID   Continuous Infusions:  0.9 % NaCl with KCl 40 mEq / L 20 mL/hr at 12/14/22 0823     LOS: 7 days   Time spent: 25 minutes   Dessa Phi, DO [facility] 12/15/2022, 12:14 PM   Available via Epic secure chat 7am-7pm After these hours, please refer to coverage provider listed on [email or web address]

## 2022-12-15 NOTE — TOC Progression Note (Signed)
Transition of Care [facility]) - Progression Note    Patient Details  Name: Crystal [name] MRN: [id number] Date of Birth: 06-01-1946  Transition of Care [facility]) CM/SW [place], RN Phone Number:[phone]  12/15/2022, 1:29 PM  Clinical Narrative:    TOC continues to follow no needs noted at this time.         Expected Discharge Plan and Services                                               Social Determinants of Health (SDOH) Interventions SDOH Screenings   Food Insecurity: No Food Insecurity (12/08/2022)  Housing: Low Risk  (12/08/2022)  Transportation Needs: No Transportation Needs (12/08/2022)  Utilities: Not At Risk (12/08/2022)  Tobacco Use: Low Risk  (12/08/2022)    Readmission Risk Interventions     No data to display

## 2022-12-15 NOTE — Plan of Care (Signed)
Patient AOX4, VSS throughout shift.  All meds given on time as ordered.  Diminished lungs, IS encouraged.  Pt standby assist to bathroom.  Purple/reddish rash on bilateral arms and chest d/t past chemo treatments.  POC maintained, will continue to monitor.  Problem: Education: Goal: Knowledge of General Education information will improve Description: Including pain rating scale, medication(s)/side effects and non-pharmacologic comfort measures Outcome: Progressing   Problem: Health Behavior/Discharge Planning: Goal: Ability to manage health-related needs will improve Outcome: Progressing   Problem: Clinical Measurements: Goal: Ability to maintain clinical measurements within normal limits will improve Outcome: Progressing Goal: Will remain free from infection Outcome: Progressing Goal: Diagnostic test results will improve Outcome: Progressing Goal: Respiratory complications will improve Outcome: Progressing Goal: Cardiovascular complication will be avoided Outcome: Progressing   Problem: Activity: Goal: Risk for activity intolerance will decrease Outcome: Progressing   Problem: Nutrition: Goal: Adequate nutrition will be maintained Outcome: Progressing   Problem: Coping: Goal: Level of anxiety will decrease Outcome: Progressing   Problem: Elimination: Goal: Will not experience complications related to bowel motility Outcome: Progressing Goal: Will not experience complications related to urinary retention Outcome: Progressing   Problem: Pain Managment: Goal: General experience of comfort will improve Outcome: Progressing   Problem: Safety: Goal: Ability to remain free from injury will improve Outcome: Progressing   Problem: Skin Integrity: Goal: Risk for impaired skin integrity will decrease Outcome: Progressing   

## 2022-12-16 DIAGNOSIS — K921 Melena: Secondary | ICD-10-CM | POA: Diagnosis not present

## 2022-12-16 DIAGNOSIS — T451X5A Adverse effect of antineoplastic and immunosuppressive drugs, initial encounter: Secondary | ICD-10-CM | POA: Diagnosis not present

## 2022-12-16 DIAGNOSIS — D696 Thrombocytopenia, unspecified: Secondary | ICD-10-CM | POA: Diagnosis not present

## 2022-12-16 DIAGNOSIS — R197 Diarrhea, unspecified: Secondary | ICD-10-CM | POA: Diagnosis not present

## 2022-12-16 DIAGNOSIS — D701 Agranulocytosis secondary to cancer chemotherapy: Secondary | ICD-10-CM | POA: Diagnosis not present

## 2022-12-16 LAB — BASIC METABOLIC PANEL
Anion gap: 7 (ref 5–15)
BUN: 20 mg/dL (ref 8–23)
CO2: 29 mmol/L (ref 22–32)
Calcium: 6.9 mg/dL — ABNORMAL LOW (ref 8.9–10.3)
Chloride: 104 mmol/L (ref 98–111)
Creatinine, Ser: 1.11 mg/dL — ABNORMAL HIGH (ref 0.44–1.00)
GFR, Estimated: 51 mL/min — ABNORMAL LOW (ref >60)
Glucose, Bld: 104 mg/dL — ABNORMAL HIGH (ref 70–99)
Potassium: 3.3 mmol/L — ABNORMAL LOW (ref 3.5–5.1)
Sodium: 140 mmol/L (ref 135–145)

## 2022-12-16 LAB — CBC WITH DIFFERENTIAL/PLATELET
Abs Immature Granulocytes: 0.59 10*3/uL — ABNORMAL HIGH (ref 0.00–0.07)
Basophils Absolute: 0.1 10*3/uL (ref 0.0–0.1)
Basophils Relative: 1 %
Eosinophils Absolute: 0 10*3/uL (ref 0.0–0.5)
Eosinophils Relative: 0 %
HCT: 22.4 % — ABNORMAL LOW (ref 36.0–46.0)
Hemoglobin: 7.1 g/dL — ABNORMAL LOW (ref 12.0–15.0)
Immature Granulocytes: 10 %
Lymphocytes Relative: 20 %
Lymphs Abs: 1.3 10*3/uL (ref 0.7–4.0)
MCH: 29.2 pg (ref 26.0–34.0)
MCHC: 31.7 g/dL (ref 30.0–36.0)
MCV: 92.2 fL (ref 80.0–100.0)
Monocytes Absolute: 0.5 10*3/uL (ref 0.1–1.0)
Monocytes Relative: 9 %
Neutro Abs: 3.7 10*3/uL (ref 1.7–7.7)
Neutrophils Relative %: 60 %
Platelets: 11 10*3/uL — CL (ref 150–400)
RBC: 2.43 MIL/uL — ABNORMAL LOW (ref 3.87–5.11)
RDW: 13.9 % (ref 11.5–15.5)
WBC: 6.2 10*3/uL (ref 4.0–10.5)
nRBC: 0 % (ref 0.0–0.2)

## 2022-12-16 LAB — C DIFFICILE (CDIFF) QUICK SCRN (NO PCR REFLEX)
C Diff antigen: NEGATIVE
C Diff interpretation: NOT DETECTED
C Diff toxin: NEGATIVE

## 2022-12-16 LAB — MAGNESIUM: Magnesium: 1.8 mg/dL (ref 1.7–2.4)

## 2022-12-16 LAB — PREPARE RBC (CROSSMATCH)

## 2022-12-16 LAB — OCCULT BLOOD X 1 CARD TO LAB, STOOL: Fecal Occult Bld: POSITIVE — AB

## 2022-12-16 MED ORDER — SUCRALFATE 1 G PO TABS
1.0000 g | ORAL_TABLET | Freq: Three times a day (TID) | ORAL | Status: DC
  Administered 2022-12-16 – 2022-12-18 (×7): 1 g via ORAL
  Filled 2022-12-16 (×7): qty 1

## 2022-12-16 MED ORDER — POTASSIUM CHLORIDE CRYS ER 20 MEQ PO TBCR
40.0000 meq | EXTENDED_RELEASE_TABLET | ORAL | Status: AC
  Administered 2022-12-16 (×2): 40 meq via ORAL
  Filled 2022-12-16 (×2): qty 2

## 2022-12-16 MED ORDER — SODIUM CHLORIDE 0.9% IV SOLUTION: Freq: Once | INTRAVENOUS | Status: AC

## 2022-12-16 MED ORDER — PANTOPRAZOLE SODIUM 40 MG IV SOLR
40.0000 mg | Freq: Two times a day (BID) | INTRAVENOUS | Status: DC
  Administered 2022-12-16 – 2022-12-18 (×5): 40 mg via INTRAVENOUS
  Filled 2022-12-16 (×5): qty 10

## 2022-12-16 MED ORDER — PANTOPRAZOLE SODIUM 40 MG IV SOLR
40.0000 mg | Freq: Once | INTRAVENOUS | Status: AC
  Administered 2022-12-16: 40 mg via INTRAVENOUS
  Filled 2022-12-16: qty 10

## 2022-12-16 NOTE — Progress Notes (Addendum)
PROGRESS NOTE    Crystal [name]  [id number] DOB: 04-30-1946 DOA: 12/08/2022 PCP: Gaynelle Arabian, MD     Brief Narrative:  Crystal [name] is a 77 y.o. female with medical history significant for pulmonary embolism on Eliquis, non-small cell lung cancer who just completed cycle 1 of carboplatin/Alimta/Pembrolizumab 11/29/2022 under the care of Dr. Benay Spice and is being admitted to the hospital with neutropenia and skin breakdown.  Unfortunately on 12/03/2022 she started to develop sores and diarrhea unable to take anything by mouth due to severe pain.  She also developed a rash in the lower abdomen, which looks significantly worse today, with increasing erythema, and some bloody drainage intermittently.  Oncology recommended admission for supportive care with IV fluids, and empiric IV antibiotics given her neutropenic status.    Hospital course complicated by severe pancytopenia, chemotherapy-induced.  On G CSF and prophylactic IV antibiotics.  New events last 24 hours / Subjective: Had report of watery black stool earlier this morning.  Had normal stool yesterday.  No nausea or vomiting.  Assessment & Plan:   Principal Problem:   Neutropenia ([place]) Active Problems:   Essential hypertension   Dyslipidemia, goal LDL below 100   Nonischemic cardiomyopathy (HCC)   Hypokalemia   Metastatic non-small cell lung cancer (HCC)   Malignant pleural effusion   CKD (chronic kidney disease), stage III (HCC)   Chronic HFrEF (heart failure with reduced ejection fraction) (HCC)   Cellulitis   Chemotherapy-induced neutropenia -Completed rocephin and vancomycin -Blood culture negative  -Appreciate oncology, discussed with Dr. Benay Spice today    Severe pancytopenia secondary to chemotherapy  -Home Eliquis stopped -Appreciate oncology   GI bleed -FOBT positive  -Transfuse 1 unit packed red blood cell and 1 unit platelet in setting of pancytopenia -IV PPI  -NPO -GI consult, discussed with Dr.  Therisa Doyne    Persistent small right pleural effusion with new small amount of loculated fluid in the fissure -Right-sided chest tube in place, output 167m 2/23  -Fluid culture is pending -Fluid is exudative    Mucositis -Severe mouth sores and severe rash in her groin area -Continue Magic mouthwash with lidocaine.  Finished Diflucan 100 mg x 2 -Continue nystatin powder, hydrocortisone cream -Continue supportive care   Hypertension -Metoprolol   Hypokalemia -Replace     CKD stage IIIa -Stable    Metastatic non-small cell lung cancer -Diagnosed in November 2023.  Underwent radiation therapy in December and currently on chemo. -Followed by oncology   History of recurrent PE -Eliquis is stopped due to severe thrombocytopenia   Chronic HFrEF -Echo from 11/12/2022 ejection fraction of 45 to 50% with grade 1 diastolic dysfunction -Stable    Anxiety -Continue Ativan as needed   Morbid obesity -BMI of 35     DVT prophylaxis: Eliquis on hold (last dose 2/17)  Code Status: DNR Family Communication: No family at bedside; updated [place] today  Disposition Plan:  Status is: Inpatient Remains inpatient appropriate because: pancytopenia   Consultants:  Oncology   Procedures:  None   Antimicrobials:  Anti-infectives (From admission, onward)    Start     Dose/Rate Route Frequency Ordered Stop   12/14/22 1400  vancomycin (VANCOREADY) IVPB 750 mg/150 mL        750 mg 150 mL/hr over 60 Minutes Intravenous Every 24 hours 12/14/22 1206 12/14/22 1513   12/10/22 1315  vancomycin (VANCOREADY) IVPB 750 mg/150 mL  Status:  Discontinued        750 mg 150 mL/hr over  60 Minutes Intravenous Every 24 hours 12/10/22 1309 12/14/22 1206   12/09/22 1000  fluconazole (DIFLUCAN) tablet 100 mg        100 mg Oral Daily 12/08/22 1441 12/10/22 0913   12/08/22 1600  cefTRIAXone (ROCEPHIN) 2 g in sodium chloride 0.9 % 100 mL IVPB        2 g 200 mL/hr over 30 Minutes Intravenous  Every 24 hours 12/08/22 1441 12/14/22 1625   12/08/22 1600  vancomycin (VANCOREADY) IVPB 1250 mg/250 mL  Status:  Discontinued        1,250 mg 166.7 mL/hr over 90 Minutes Intravenous Every 48 hours 12/08/22 1450 12/10/22 1309        Objective: Vitals:   12/16/22 0533 12/16/22 1108 12/16/22 1150 12/16/22 1207  BP: (!) 103/58 (!) 112/92 130/84 112/69  Pulse: (!) 101 (!) 107 (!) 106 (!) 106  Resp: '20  16 14  '$ Temp: (!) 97.5 F (36.4 C)  97.9 F (36.6 C) 98.2 F (36.8 C)  TempSrc: Oral  Oral Oral  SpO2: 96%  93% 93%  Weight:      Height:        Intake/Output Summary (Last 24 hours) at 12/16/2022 1257 Last data filed at 12/16/2022 1028 Gross per 24 hour  Intake 176.43 ml  Output --  Net 176.43 ml    Filed Weights   12/08/22 1500  Weight: 95.7 kg    Examination:  General exam: Appears calm and comfortable  Respiratory system: Clear to auscultation. Respiratory effort normal. No respiratory distress. No conversational dyspnea.  Cardiovascular system: S1 & S2 heard, RRR. No murmurs. No pedal edema. Gastrointestinal system: Abdomen is nondistended, soft and nontender. Normal bowel sounds heard. Central nervous system: Alert and oriented. No focal neurological deficits. Speech clear.  Extremities: Symmetric in appearance  Skin: No rashes, lesions or ulcers on exposed skin  Psychiatry: Judgement and insight appear normal. Mood & affect appropriate.   Data Reviewed: I have personally reviewed following labs and imaging studies  CBC: Recent Labs  Lab 12/12/22 0635 12/13/22 0555 12/14/22 0450 12/15/22 0607 12/16/22 0500  WBC 0.7* 1.0* 2.2* 4.3 6.2  NEUTROABS 0.1* 0.2* 0.8* 2.4 3.7  HGB 8.9* 9.1* 8.8* 8.1* 7.1*  HCT 28.2* 28.8* 27.4* 25.6* 22.4*  MCV 93.7 94.4 93.5 93.1 92.2  PLT 21* 11* 5* 22* 11*    Basic Metabolic Panel: Recent Labs  Lab 12/12/22 0635 12/13/22 0555 12/14/22 0450 12/15/22 0607 12/16/22 0500  NA 143 143 140 140 140  K 3.3* 3.4* 3.5 3.1* 3.3*   CL 107 107 107 103 104  CO2 '27 26 26 28 29  '$ GLUCOSE 94 95 91 94 104*  BUN '14 13 12 10 20  '$ CREATININE 1.18* 1.13* 1.14* 0.50 1.11*  CALCIUM 7.1* 7.3* 7.0* 6.8* 6.9*  MG 1.6* 1.7 1.6* 1.6* 1.8    GFR: Estimated Creatinine Clearance: 48.6 mL/min (A) (by C-G formula based on SCr of 1.11 mg/dL (H)). Liver Function Tests: Recent Labs  Lab 12/10/22 0500 12/12/22 0635  AST 17 14*  ALT 11 10  ALKPHOS 41 38  BILITOT 0.6 0.5  PROT 5.5* 5.2*  ALBUMIN 2.1* 2.0*    No results for input(s): "LIPASE", "AMYLASE" in the last 168 hours. No results for input(s): "AMMONIA" in the last 168 hours. Coagulation Profile: No results for input(s): "INR", "PROTIME" in the last 168 hours. Cardiac Enzymes: No results for input(s): "CKTOTAL", "CKMB", "CKMBINDEX", "TROPONINI" in the last 168 hours. BNP (last 3 results) No results for input(s): "PROBNP"  in the last 8760 hours. HbA1C: No results for input(s): "HGBA1C" in the last 72 hours. CBG: No results for input(s): "GLUCAP" in the last 168 hours. Lipid Profile: No results for input(s): "CHOL", "HDL", "LDLCALC", "TRIG", "CHOLHDL", "LDLDIRECT" in the last 72 hours. Thyroid Function Tests: No results for input(s): "TSH", "T4TOTAL", "FREET4", "T3FREE", "THYROIDAB" in the last 72 hours. Anemia Panel: No results for input(s): "VITAMINB12", "FOLATE", "FERRITIN", "TIBC", "IRON", "RETICCTPCT" in the last 72 hours. Sepsis Labs: No results for input(s): "PROCALCITON", "LATICACIDVEN" in the last 168 hours.   Recent Results (from the past 240 hour(s))  Culture, blood (Routine X 2) w Reflex to ID Panel     Status: None   Collection Time: 12/08/22  3:31 PM   Specimen: BLOOD LEFT ARM  Result Value Ref Range Status   Specimen Description   Final    BLOOD LEFT ARM Performed at [facility], [address], [place], [place] [address]    Special Requests   Final    BOTTLES DRAWN AEROBIC ONLY Blood Culture adequate volume Performed at [facility] [address], [place], [place] [address]    Culture   Final    NO GROWTH 5 DAYS Performed at [facility], [place] [address], [place], [place] [address]    Report Status 12/13/2022 FINAL  Final  Culture, blood (Routine X 2) w Reflex to ID Panel     Status: None   Collection Time: 12/08/22  3:35 PM   Specimen: BLOOD  Result Value Ref Range Status   Specimen Description   Final    BLOOD AEROBIC BOTTLE ONLY Performed at [facility] [address], [place], [place] [address]    Special Requests   Final    BOTTLES DRAWN AEROBIC ONLY Blood Culture adequate volume Performed at [facility] [address] [place], [place] [address]    Culture   Final    NO GROWTH 5 DAYS Performed at [facility], [place] [address], [place], [place] [address]    Report Status 12/13/2022 FINAL  Final  Body fluid culture w Gram Stain     Status: None (Preliminary result)   Collection Time: 12/14/22 10:04 AM   Specimen: Pleural Fluid  Result Value Ref Range Status   Specimen Description   Final    PLEURAL Performed at [facility] [address] [place], [place] [address]    Special Requests   Final    NONE Performed at [facility], [place] [address] [place], [place] [address]    Gram Stain   Final    RARE WBC PRESENT, PREDOMINANTLY MONONUCLEAR NO ORGANISMS SEEN    Culture   Final    NO GROWTH < 24 HOURS Performed at [facility], [place] [address] [place],  [address]    Report Status PENDING  Incomplete      Radiology Studies: No results found.    Scheduled Meds:  Chlorhexidine Gluconate Cloth  6 each Topical Daily   docusate sodium  100 mg Oral BID   feeding supplement  237 mL Oral BID BM   folic acid  1 mg Oral Daily   metoprolol tartrate  100 mg Oral BID   multivitamin with minerals  1 tablet Oral Daily   nystatin  1 Application Topical TID   oxyCODONE  5 mg  Oral Q6H   potassium chloride  40 mEq Oral Q4H   Continuous Infusions:  LOS: 8 days   Time spent: 25 minutes   Dessa Phi, DO [facility] 12/16/2022, 12:57 PM   Available via Epic secure chat 7am-7pm After these hours, please refer to coverage provider listed on [email or web address]

## 2022-12-16 NOTE — Plan of Care (Signed)
Problem: Clinical Measurements: Goal: Ability to maintain clinical measurements within normal limits will improve Outcome: Progressing Goal: Diagnostic test results will improve Outcome: Progressing Goal: Respiratory complications will improve Outcome: Progressing   Problem: Activity: Goal: Risk for activity intolerance will decrease Outcome: Progressing   Problem: Elimination: Goal: Will not experience complications related to bowel motility Outcome: Progressing   Problem: Pain Managment: Goal: General experience of comfort will improve Outcome: Progressing   Problem: Safety: Goal: Ability to remain free from injury will improve Outcome: Progressing   Problem: Skin Integrity: Goal: Risk for impaired skin integrity will decrease Outcome: Progressing

## 2022-12-16 NOTE — Progress Notes (Signed)
       CROSS COVER NOTE  NAME: Crystal [name] MRN: [id number] DOB : December 26, 1945    Date of Service   12/16/2022     HPI/Events of Note   Notified by RN of patient having watery black stool with some red.    Pt states that this stool change started yesterday.  Eliquis was stopped on 2/18 due to severe thrombocytopenia.  Patient received platelets transfusion on 2/22.   Was placed for occult blood stool, Protonix.  Labs pending.   Interventions/ Plan   Type and screen, CBC-pending Occult blood, stool IV Protonix       Raenette Rover, DNP, [place]

## 2022-12-16 NOTE — Consult Note (Signed)
[facility] Gastroenterology Consult  Referring Provider: Dr. Derrek Monaco hospitalist Primary Care Physician:  Gaynelle Arabian, MD Primary Gastroenterologist: Previous patient of Dr. Oletta Lamas  Reason for Consultation: [place]  HPI: Crystal [name] is a 77 y.o. female with history of non-small cell lung cancer status post treatment, last chemotherapy on 11/29/2022, history of pulmonary embolism on Eliquis, admitted with neutropenia and skin breakdown on 12/08/2022. Patient has been having liquid black bowel movements for the last several days, almost 10 days, however since 10 PM yesterday she has had increased frequency, fecal accidents and continued liquid black stools.  Prior GI workup: EGD 2014, chronic diarrhea: Normal, biopsies negative for celiac Colonoscopy, 2014, chronic diarrhea: Unremarkable, biopsies negative for microscopic colitis  Patient complains of mouth sores which cause difficulty and pain on swallowing which have now improved, she denies acid reflux or heartburn, denies current difficulty swallowing or pain on swallowing. Denies abdominal or rectal pain. Denies nausea or vomiting.   Past Medical History:  Diagnosis Date   Anxiety    Atherosclerosis of aorta ([place])    Breast cancer ([place]) 1998   (Rt) lumpectomy dx 1999; Dr. Benay Spice   Dyslipidemia, goal LDL below 130    Essential hypertension    GERD (gastroesophageal reflux disease)    Hypercholesterolemia    IBS (irritable bowel syndrome)    Lymphoma ([place])    lymphoma dx 11/28/10 - left neck   non hodgkins lymphoma 12/2010   right aprotid gland   Nonischemic cardiomyopathy ([place]) 2008   ? Doxorubincin induced; essentially resolved as of echo in January 2014, current EF 50-55%. Grade 1 diastolic dysfunction.   Obesity    Severe obesity (BMI >= 40) ([place]) 05/21/2013   Improve to BMI of 39 by July 2015   Tremor     Past Surgical History:  Procedure Laterality Date   ABDOMINAL HYSTERECTOMY     BSO   ANKLE FRACTURE SURGERY  Left    BREAST EXCISIONAL BIOPSY Left    BREAST EXCISIONAL BIOPSY Right    BREAST LUMPECTOMY Right 1998   BREAST LUMPECTOMY Right 08/25/2020   Procedure: RIGHT BREAST LUMPECTOMY;  Surgeon: Coralie Keens, MD;  Location: [facility];  Service: General;  Laterality: Right;   CHEST TUBE INSERTION Left 11/16/2022   Procedure: INSERTION PLEURAL DRAINAGE CATHETER;  Surgeon: Freddi Starr, MD;  Location: [facility];  Service: Pulmonary;  Laterality: Left;  afternoon scheduling time please   CHOLECYSTECTOMY N/A 06/09/2020   Procedure: LAPAROSCOPIC CHOLECYSTECTOMY;  Surgeon: Coralie Keens, MD;  Location: [facility];  Service: General;  Laterality: N/A;   COLONOSCOPY     IR BONE TUMOR(S)RF ABLATION  11/06/2022   IR IMAGING GUIDED PORT INSERTION  11/15/2022   IR KYPHO LUMBAR INC FX REDUCE BONE BX UNI/BIL CANNULATION INC/IMAGING  11/06/2022   IR RADIOLOGIST EVAL & MGMT  10/26/2022   LEFT HEART CATH AND CORONARY ANGIOGRAPHY  12/2007   None coronary disease, EF 45% (up from nuclear study EF of 30% in June '08)   NM MYOVIEW LTD  03/29/2007   EF 33%,NEGATIVE ISCHEMIA, prob need cath   NM MYOVIEW LTD  05/01/2014   Lexiscan: EF 55%. Normal wall motion; no ischemia or infarction; apical thinning   PORTACATH PLACEMENT  08/25/2013   rt. with tip in cavoatrial junction, Dr.Yamagata    TRANSTHORACIC ECHOCARDIOGRAM  05/01/2014   Normal LV size with low normal function. EF of 50-55% and Gr 1 DD; aortic sclerosis without stenosis. MAC and thickening/calcification of the anterior leaflet - no notable  AI / AS or MR/MS    Prior to Admission medications   Medication Sig Start Date End Date Taking? Authorizing Provider  acetaminophen (TYLENOL) 325 MG tablet Take 2 tablets (650 mg total) by mouth every 6 (six) hours as needed for mild pain or moderate pain. 09/14/22  Yes Arrien, Jimmy Picket, MD  apixaban (ELIQUIS) 5 MG TABS tablet Take 1 tablet (5 mg total) by mouth 2 (two) times daily. 12/04/22   Yes Owens Shark, NP  diphenhydrAMINE (BENADRYL) 25 MG tablet Take 50 mg by mouth at bedtime.   Yes [provider]  folic acid (FOLVITE) 1 MG tablet Take 1 tablet (1 mg total) by mouth daily. 11/29/22  Yes Owens Shark, NP  lidocaine-prilocaine (EMLA) cream Apply 1 Application topically as needed (Apply to port site an hour before port to be accessed). 11/27/22  Yes Ladell Pier, MD  LORazepam (ATIVAN) 0.5 MG tablet Take 1 tablet (0.5 mg total) by mouth 2 (two) times daily as needed. Patient taking differently: Take 0.5 mg by mouth 2 (two) times daily as needed for anxiety. 10/31/22  Yes Ladell Pier, MD  magic mouthwash (nystatin, diphenhydrAMINE, alum & mag hydroxide) suspension mixture Swish 5 mL by mouth for 1 minute then spit; may use up to four times daily. Patient taking differently: Swish and spit 5 mLs See admin instructions. : Swish 5 mL by mouth for 1 minute then spit; may use up to four times daily. 12/05/22  Yes Owens Shark, NP  magnesium oxide (MAG-OX) 400 (240 Mg) MG tablet Take 1 tablet (400 mg total) by mouth daily. 12/05/22  Yes Owens Shark, NP  metoprolol tartrate (LOPRESSOR) 100 MG tablet Take 1 tablet (100 mg total) by mouth 2 (two) times daily. 11/17/22 12/17/22 Yes Donne Hazel, MD  nystatin (MYCOSTATIN/NYSTOP) powder Apply 1 Application topically 3 (three) times daily. 12/05/22  Yes Owens Shark, NP  ondansetron (ZOFRAN) 8 MG tablet Take 1 tablet (8 mg total) by mouth every 8 (eight) hours as needed for nausea or vomiting (starting using day 3 after treatment as needed for nausea/vomiting). 11/27/22  Yes Ladell Pier, MD  oxyCODONE (OXY IR/ROXICODONE) 5 MG immediate release tablet Take 1 tablet (5 mg total) by mouth every 6 (six) hours as needed for severe pain. 11/23/22  Yes Ladell Pier, MD  potassium chloride (KLOR-CON M) 10 MEQ tablet Take 1 tablet (10 mEq total) by mouth 2 (two) times daily. 12/05/22  Yes Owens Shark, NP  prochlorperazine  (COMPAZINE) 10 MG tablet Take 1 tablet (10 mg total) by mouth every 6 (six) hours as needed for nausea or vomiting. 11/27/22  Yes Ladell Pier, MD    Current Facility-Administered Medications  Medication Dose Route Frequency Provider Last Rate Last Admin   benzocaine (ORAJEL) 10 % mucosal gel   Mouth/Throat BID PRN Ladell Pier, MD       Chlorhexidine Gluconate Cloth 2 % PADS 6 each  6 each Topical Daily Ladell Pier, MD   6 each at 12/16/22 1109   feeding supplement (ENSURE ENLIVE / ENSURE PLUS) liquid 237 mL  237 mL Oral BID BM Hall, Carole N, DO   237 mL at [id number]   folic acid (FOLVITE) tablet 1 mg  1 mg Oral Daily Hollice Gong, Mir M, MD   1 mg at 12/16/22 1107   hydrocortisone cream 0.5 %   Topical PRN Lucillie Garfinkel, MD   Given at 12/09/22 2141  HYDROmorphone (DILAUDID) injection 0.5 mg  0.5 mg Intravenous Q4H PRN Pahwani, Rinka R, MD   0.5 mg at 12/13/22 0557   LORazepam (ATIVAN) tablet 0.5 mg  0.5 mg Oral BID PRN Hollice Gong, Mir M, MD   0.5 mg at 12/15/22 2154   magic mouthwash  10 mL Oral QID PRN Ladell Pier, MD   10 mL at 12/15/22 2148   metoprolol tartrate (LOPRESSOR) tablet 100 mg  100 mg Oral BID Hollice Gong, Mir M, MD   100 mg at 12/16/22 1108   multivitamin with minerals tablet 1 tablet  1 tablet Oral Daily Irene Pap N, DO   1 tablet at 12/16/22 1107   nystatin (MYCOSTATIN/NYSTOP) topical powder 1 Application  1 Application Topical TID Lucillie Garfinkel, MD   1 Application at [id number]   ondansetron (ZOFRAN) tablet 4 mg  4 mg Oral Q6H PRN Lucillie Garfinkel, MD   4 mg at 12/15/22 2153   Or   ondansetron (ZOFRAN) injection 4 mg  4 mg Intravenous Q6H PRN Lucillie Garfinkel, MD   4 mg at 12/16/22 [id number]   oxyCODONE (Oxy IR/ROXICODONE) immediate release tablet 5 mg  5 mg Oral Q6H Dessa Phi, DO   5 mg at 12/16/22 1108   pantoprazole (PROTONIX) injection 40 mg  40 mg Intravenous Q12H Dessa Phi, DO       polyvinyl alcohol (LIQUIFILM TEARS) 1.4 %  ophthalmic solution 1 drop  1 drop Both Eyes PRN Dessa Phi, DO   1 drop at 12/14/22 1141   traZODone (DESYREL) tablet 25 mg  25 mg Oral QHS PRN Lucillie Garfinkel, MD   25 mg at 12/12/22 2102    Allergies as of 12/08/2022 - Review Complete 12/08/2022  Allergen Reaction Noted   Simvastatin Other (See Comments) 08/01/2022   Zoloft [sertraline] Nausea Only 08/01/2022   Codeine Other (See Comments) 01/03/2011   Coreg Other (See Comments) 12/22/2010   Lisinopril Cough 09/06/2017   Tizanidine hcl Rash and Other (See Comments) 08/01/2022    Family History  Problem Relation Age of Onset   Heart Problems Mother        CABG   Chronic Renal Failure Mother    Heart Problems Father    Hypertension Sister    Cancer Sister        bladder   Migraines Sister    Heart attack Sister    Hypertension Sister        x2   Lupus Brother    Hypertension Brother        and lupus   Heart Problems Maternal Grandmother    Stroke Maternal Grandfather    Cancer Paternal Grandmother        stomach   Heart Problems Paternal Grandfather     Social History   Socioeconomic History   Marital status: Widowed    Spouse name: Not on file   Number of children: 3   Years of education: hs   Highest education level: Not on file  Occupational History   Occupation: Retired  Tobacco Use   Smoking status: Never   Smokeless tobacco: Never  Vaping Use   Vaping Use: Never used  Substance and Sexual Activity   Alcohol use: Yes    Comment: social    Drug use: No   Sexual activity: Not on file  Other Topics Concern   Not on file  Social History Narrative   She is a widow mother of 2, with 1 child is deceased. She  has 2 grandchildren. She is trying to get some walking in town and can do about 20-25 minutes his another 30 minutes she pushes a more night gets short of breath.   She is at retired [employer], he simply laid off after 45 years.   She usually comes accompanied by her sister, who is  also my patient.   She never was a smoker, and only occasionally has a social alcoholic beverage.   Social Determinants of Health   Financial Resource Strain: Not on file  Food Insecurity: No Food Insecurity (12/08/2022)   Hunger Vital Sign    Worried About Running Out of Food in the Last Year: Never true    Ran Out of Food in the Last Year: Never true  Transportation Needs: No Transportation Needs (12/08/2022)   PRAPARE - Hydrologist (Medical): No    Lack of Transportation (Non-Medical): No  Physical Activity: Not on file  Stress: Not on file  Social Connections: Not on file  Intimate Partner Violence: Not At Risk (12/08/2022)   Humiliation, Afraid, Rape, and Kick questionnaire    Fear of Current or Ex-Partner: No    Emotionally Abused: No    Physically Abused: No    Sexually Abused: No    Review of Systems: As per HPI Physical Exam: Vital signs in last 24 hours: Temp:  [97.5 F (36.4 C)-98.3 F (36.8 C)] 98 F (36.7 C) (02/24 1452) Pulse Rate:  [66-107] 93 (02/24 1452) Resp:  [14-20] 14 (02/24 1452) BP: (103-130)/(57-92) 108/83 (02/24 1452) SpO2:  [93 %-100 %] 95 % (02/24 1434) FiO2 (%):  [28 %] 28 % (02/24 1207) Last BM Date : 12/15/22  General:   Alert,  Well-developed, overweight weight, pleasant and cooperative in NAD Head:  Normocephalic and atraumatic. Eyes:  Sclera clear, no icterus.   Mild pallor Ears:  Normal auditory acuity. Nose:  No deformity, discharge,  or lesions. Mouth:  No deformity or lesions.  Oropharynx pink & moist. Neck:  Supple; no masses or thyromegaly. Lungs:  Clear throughout to auscultation.   No wheezes, crackles, or rhonchi. No acute distress. Heart:  Regular rate and rhythm; no murmurs, clicks, rubs,  or gallops. Extremities:  Without clubbing or edema. Neurologic:  Alert and  oriented x4;  grossly normal neurologically. Skin:  Intact without significant lesions or rashes. Psych:  Alert and cooperative.  Normal mood and affect. Abdomen:  Soft, nontender and nondistended. No masses, hepatosplenomegaly or hernias noted. Normal bowel sounds, without guarding, and without rebound.         Lab Results: Recent Labs    12/14/22 0450 12/15/22 0607 12/16/22 0500  WBC 2.2* 4.3 6.2  HGB 8.8* 8.1* 7.1*  HCT 27.4* 25.6* 22.4*  PLT 5* 22* 11*   BMET Recent Labs    12/14/22 0450 12/15/22 0607 12/16/22 0500  NA 140 140 140  K 3.5 3.1* 3.3*  CL 107 103 104  CO2 '26 28 29  '$ GLUCOSE 91 94 104*  BUN '12 10 20  '$ CREATININE 1.14* 0.50 1.11*  CALCIUM 7.0* 6.8* 6.9*   LFT No results for input(s): "PROT", "ALBUMIN", "AST", "ALT", "ALKPHOS", "BILITOT", "BILIDIR", "IBILI" in the last 72 hours. PT/INR No results for input(s): "LABPROT", "INR" in the last 72 hours.  Studies/Results: No results found.  Impression: Black watery stools with normal BUN/creatinine ratio Anemia, hemoglobin 9.1 then 8.8 then 8.1 and today 7.1 On ongoing severe thrombocytopenia, platelet count 11 today Hypokalemia, elevated creatinine 1.11 and  low GFR of 51  Multiple comorbidities-metastatic non-small cell lung cancer, diagnosed in November 23, underwent radiation therapy in December and currently on chemotherapy, admitted with chemotherapy-induced neutropenia, severe pancytopenia, mucositis    Plan: Due to severe thrombocytopenia, do not recommend endoscopic intervention. Patient has multiple loose black stools, will send stool for C. difficile and GI pathogen panel testing to rule out infection. Treat empirically with PPI, agree with pantoprazole 40 mg IV twice a day, will add Carafate 1 g 4 times a day. Conservative management, H&H monitoring and transfusion as needed. Cannot perform endoscopic evaluation until platelet count is at least above 50,000. Discussed the same with the patient, her sister and niece at bedside and her hospitalist Dr. Maylene Roes.   LOS: 8 days   Ronnette Juniper, MD  12/16/2022, 3:12 PM

## 2022-12-16 NOTE — Plan of Care (Signed)
  Problem: Education: Goal: Knowledge of General Education information will improve Description: Including pain rating scale, medication(s)/side effects and non-pharmacologic comfort measures Outcome: Progressing   Problem: Safety: Goal: Ability to remain free from injury will improve Outcome: Progressing   

## 2022-12-17 ENCOUNTER — Other Ambulatory Visit: Payer: Self-pay | Admitting: Oncology

## 2022-12-17 DIAGNOSIS — D701 Agranulocytosis secondary to cancer chemotherapy: Secondary | ICD-10-CM | POA: Diagnosis not present

## 2022-12-17 DIAGNOSIS — T451X5A Adverse effect of antineoplastic and immunosuppressive drugs, initial encounter: Secondary | ICD-10-CM | POA: Diagnosis not present

## 2022-12-17 DIAGNOSIS — K921 Melena: Secondary | ICD-10-CM | POA: Diagnosis not present

## 2022-12-17 DIAGNOSIS — R197 Diarrhea, unspecified: Secondary | ICD-10-CM | POA: Diagnosis not present

## 2022-12-17 DIAGNOSIS — D696 Thrombocytopenia, unspecified: Secondary | ICD-10-CM | POA: Diagnosis not present

## 2022-12-17 LAB — BASIC METABOLIC PANEL
Anion gap: 7 (ref 5–15)
BUN: 18 mg/dL (ref 8–23)
CO2: 28 mmol/L (ref 22–32)
Calcium: 7.4 mg/dL — ABNORMAL LOW (ref 8.9–10.3)
Chloride: 107 mmol/L (ref 98–111)
Creatinine, Ser: 1.27 mg/dL — ABNORMAL HIGH (ref 0.44–1.00)
GFR, Estimated: 44 mL/min — ABNORMAL LOW (ref >60)
Glucose, Bld: 85 mg/dL (ref 70–99)
Potassium: 4.1 mmol/L (ref 3.5–5.1)
Sodium: 142 mmol/L (ref 135–145)

## 2022-12-17 LAB — GASTROINTESTINAL PANEL BY PCR, STOOL (REPLACES STOOL CULTURE)

## 2022-12-17 LAB — CBC WITH DIFFERENTIAL/PLATELET
Abs Immature Granulocytes: 0 10*3/uL (ref 0.00–0.07)
Abs Immature Granulocytes: 0.37 10*3/uL — ABNORMAL HIGH (ref 0.00–0.07)
Band Neutrophils: 1 %
Basophils Absolute: 0 10*3/uL (ref 0.0–0.1)
Basophils Absolute: 0.1 10*3/uL (ref 0.0–0.1)
Basophils Relative: 0 %
Basophils Relative: 1 %
Eosinophils Absolute: 0 10*3/uL (ref 0.0–0.5)
Eosinophils Absolute: 0 10*3/uL (ref 0.0–0.5)
Eosinophils Relative: 0 %
Eosinophils Relative: 0 %
HCT: 22.6 % — ABNORMAL LOW (ref 36.0–46.0)
HCT: 24.6 % — ABNORMAL LOW (ref 36.0–46.0)
Hemoglobin: 7.4 g/dL — ABNORMAL LOW (ref 12.0–15.0)
Hemoglobin: 8 g/dL — ABNORMAL LOW (ref 12.0–15.0)
Immature Granulocytes: 5 %
Lymphocytes Relative: 8 %
Lymphocytes Relative: 19 %
Lymphs Abs: 0.7 10*3/uL (ref 0.7–4.0)
Lymphs Abs: 1.6 10*3/uL (ref 0.7–4.0)
MCH: 30.3 pg (ref 26.0–34.0)
MCH: 30.2 pg (ref 26.0–34.0)
MCHC: 32.7 g/dL (ref 30.0–36.0)
MCHC: 32.5 g/dL (ref 30.0–36.0)
MCV: 92.6 fL (ref 80.0–100.0)
MCV: 92.8 fL (ref 80.0–100.0)
Monocytes Absolute: 0.1 10*3/uL (ref 0.1–1.0)
Monocytes Absolute: 0.6 10*3/uL (ref 0.1–1.0)
Monocytes Relative: 1 %
Monocytes Relative: 7 %
Neutro Abs: 7.5 10*3/uL (ref 1.7–7.7)
Neutro Abs: 5.7 10*3/uL (ref 1.7–7.7)
Neutrophils Relative %: 90 %
Neutrophils Relative %: 68 %
Platelets: 48 10*3/uL — ABNORMAL LOW (ref 150–400)
Platelets: 45 10*3/uL — ABNORMAL LOW (ref 150–400)
RBC: 2.44 MIL/uL — ABNORMAL LOW (ref 3.87–5.11)
RBC: 2.65 MIL/uL — ABNORMAL LOW (ref 3.87–5.11)
RDW: 14.4 % (ref 11.5–15.5)
RDW: 14.6 % (ref 11.5–15.5)
WBC: 8.2 10*3/uL (ref 4.0–10.5)
WBC: 8.3 10*3/uL (ref 4.0–10.5)
nRBC: 0 % (ref 0.0–0.2)
nRBC: 2 /100 WBC — ABNORMAL HIGH
nRBC: 0 % (ref 0.0–0.2)

## 2022-12-17 LAB — MAGNESIUM: Magnesium: 1.6 mg/dL — ABNORMAL LOW (ref 1.7–2.4)

## 2022-12-17 MED ORDER — MAGNESIUM SULFATE 2 GM/50ML IV SOLN
2.0000 g | Freq: Once | INTRAVENOUS | Status: AC
  Administered 2022-12-17: 2 g via INTRAVENOUS
  Filled 2022-12-17: qty 50

## 2022-12-17 NOTE — Progress Notes (Signed)
Subjective: Patient states she has not had black diarrhea since 4 PM yesterday, reports 1 brown bowel movement today.  Denies abdominal pain, nausea or vomiting, is requesting for diet to be advanced.  Objective: Vital signs in last 24 hours: Temp:  [97.6 F (36.4 C)-98.3 F (36.8 C)] 97.9 F (36.6 C) (02/25 1413) Pulse Rate:  [88-109] 88 (02/25 1413) Resp:  [16-18] 16 (02/25 1413) BP: (115-129)/(69-77) 118/72 (02/25 1413) SpO2:  [93 %-95 %] 93 % (02/25 1413) Weight change:  Last BM Date : 12/15/22  PE: Obese, lying comfortably on bed GENERAL: Mild pallor ABDOMEN: Distended but soft, nontender, normoactive bowel sounds EXTREMITIES: No deformity  Lab Results: Results for orders placed or performed during the hospital encounter of 12/08/22 (from the past 48 hour(s))  CBC with Differential/Platelet     Status: Abnormal   Collection Time: 12/16/22  5:00 AM  Result Value Ref Range   WBC 6.2 4.0 - 10.5 K/uL   RBC 2.43 (L) 3.87 - 5.11 MIL/uL   Hemoglobin 7.1 (L) 12.0 - 15.0 g/dL   HCT 22.4 (L) 36.0 - 46.0 %   MCV 92.2 80.0 - 100.0 fL   MCH 29.2 26.0 - 34.0 pg   MCHC 31.7 30.0 - 36.0 g/dL   RDW 13.9 11.5 - 15.5 %   Platelets 11 (LL) 150 - 400 K/uL    Comment: SPECIMEN CHECKED FOR CLOTS Immature Platelet Fraction may be clinically indicated, consider ordering this additional test [id number] CRITICAL VALUE NOTED.  VALUE IS CONSISTENT WITH PREVIOUSLY REPORTED AND CALLED VALUE. REPEATED TO VERIFY    nRBC 0.0 0.0 - 0.2 %   Neutrophils Relative % 60 %   Neutro Abs 3.7 1.7 - 7.7 K/uL   Lymphocytes Relative 20 %   Lymphs Abs 1.3 0.7 - 4.0 K/uL   Monocytes Relative 9 %   Monocytes Absolute 0.5 0.1 - 1.0 K/uL   Eosinophils Relative 0 %   Eosinophils Absolute 0.0 0.0 - 0.5 K/uL   Basophils Relative 1 %   Basophils Absolute 0.1 0.0 - 0.1 K/uL   WBC Morphology DOHLE BODIES     Comment: MODERATE LEFT SHIFT (>5% METAS AND MYELOS,OCC PRO NOTED)   Immature Granulocytes 10 %   Abs  Immature Granulocytes 0.59 (H) 0.00 - 0.07 K/uL    Comment: Performed at [facility], [place] [address] [place], [place] [id number]  Basic metabolic panel     Status: Abnormal   Collection Time: 12/16/22  5:00 AM  Result Value Ref Range   Sodium 140 135 - 145 mmol/L   Potassium 3.3 (L) 3.5 - 5.1 mmol/L   Chloride 104 98 - 111 mmol/L   CO2 29 22 - 32 mmol/L   Glucose, Bld 104 (H) 70 - 99 mg/dL    Comment: Glucose reference range applies only to samples taken after fasting for at least 8 hours.   BUN 20 8 - 23 mg/dL   Creatinine, Ser 1.11 (H) 0.44 - 1.00 mg/dL   Calcium 6.9 (L) 8.9 - 10.3 mg/dL   GFR, Estimated 51 (L) >60 mL/min    Comment: (NOTE) Calculated using the CKD-EPI Creatinine Equation (2021)    Anion gap 7 5 - 15    Comment: Performed at [facility], [place] [address], [place], [place] [address]  Magnesium     Status: None   Collection Time: 12/16/22  5:00 AM  Result Value Ref Range   Magnesium 1.8 1.7 - 2.4 mg/dL    Comment: Performed  at [facility], [place] [address] [place], [place] [address]  Type and screen [place]     Status: None (Preliminary result)   Collection Time: 12/16/22  6:50 AM  Result Value Ref Range   ABO/RH(D) A NEG    Antibody Screen NEG    Sample Expiration 12/19/2022,2359    Unit Number [id number]    Blood Component Type RED CELLS,LR    Unit division 00    Status of Unit ISSUED    Transfusion Status OK TO TRANSFUSE    Crossmatch Result      Compatible Performed at [facility], [place] [address], [place], [place] [address]   Prepare RBC (crossmatch)     Status: None   Collection Time: 12/16/22  8:18 AM  Result Value Ref Range   Order Confirmation      ORDER PROCESSED BY BLOOD BANK Performed at [facility], [place] [address] [place], [place] [address]   Prepare platelet pheresis     Status: None (Preliminary result)   Collection  Time: 12/16/22  8:18 AM  Result Value Ref Range   Unit Number [id number]    Blood Component Type PLTP2 PSORALEN TREATED    Unit division 00    Status of Unit ISSUED    Transfusion Status OK TO TRANSFUSE   Occult blood card to lab, stool     Status: Abnormal   Collection Time: 12/16/22  2:00 PM  Result Value Ref Range   Fecal Occult Bld POSITIVE (A) NEGATIVE    Comment: Performed at [facility], [place] [address] [place], [place] [address]  C Difficile Quick Screen (NO PCR Reflex)     Status: None   Collection Time: 12/16/22  4:00 PM   Specimen: Stool  Result Value Ref Range   C Diff antigen NEGATIVE NEGATIVE   C Diff toxin NEGATIVE NEGATIVE   C Diff interpretation No C. difficile detected.     Comment: Performed at [facility], [place] [address], [place],  [address]  CBC with Differential/Platelet     Status: Abnormal   Collection Time: 12/16/22  9:47 PM  Result Value Ref Range   WBC 8.2 4.0 - 10.5 K/uL   RBC 2.44 (L) 3.87 - 5.11 MIL/uL   Hemoglobin 7.4 (L) 12.0 - 15.0 g/dL   HCT 22.6 (L) 36.0 - 46.0 %   MCV 92.6 80.0 - 100.0 fL   MCH 30.3 26.0 - 34.0 pg   MCHC 32.7 30.0 - 36.0 g/dL   RDW 14.4 11.5 - 15.5 %   Platelets 48 (L) 150 - 400 K/uL    Comment: CRITICAL VALUE NOTED.  VALUE IS CONSISTENT WITH PREVIOUSLY REPORTED AND CALLED VALUE.   nRBC 0.0 0.0 - 0.2 %   Neutrophils Relative % 90 %   Neutro Abs 7.5 1.7 - 7.7 K/uL   Band Neutrophils 1 %   Lymphocytes Relative 8 %   Lymphs Abs 0.7 0.7 - 4.0 K/uL   Monocytes Relative 1 %   Monocytes Absolute 0.1 0.1 - 1.0 K/uL   Eosinophils Relative 0 %   Eosinophils Absolute 0.0 0.0 - 0.5 K/uL   Basophils Relative 0 %   Basophils Absolute 0.0 0.0 - 0.1 K/uL   WBC Morphology TOXIC GRANULATION    nRBC 2 (H) 0 /100 WBC   Abs Immature Granulocytes 0.00 0.00 - 0.07 K/uL   Dohle Bodies PRESENT     Comment: Performed at [facility], [place] [address], [place], [place]  [address]  CBC with Differential/Platelet     Status: Abnormal   Collection Time: 12/17/22  5:00 AM  Result Value Ref Range   WBC 8.3 4.0 - 10.5 K/uL   RBC 2.65 (L) 3.87 - 5.11 MIL/uL   Hemoglobin 8.0 (L) 12.0 - 15.0 g/dL   HCT 24.6 (L) 36.0 - 46.0 %   MCV 92.8 80.0 - 100.0 fL   MCH 30.2 26.0 - 34.0 pg   MCHC 32.5 30.0 - 36.0 g/dL   RDW 14.6 11.5 - 15.5 %   Platelets 45 (L) 150 - 400 K/uL    Comment: Immature Platelet Fraction may be clinically indicated, consider ordering this additional test [id number] PLATELET COUNT CONFIRMED BY SMEAR    nRBC 0.0 0.0 - 0.2 %   Neutrophils Relative % 68 %   Neutro Abs 5.7 1.7 - 7.7 K/uL   Lymphocytes Relative 19 %   Lymphs Abs 1.6 0.7 - 4.0 K/uL   Monocytes Relative 7 %   Monocytes Absolute 0.6 0.1 - 1.0 K/uL   Eosinophils Relative 0 %   Eosinophils Absolute 0.0 0.0 - 0.5 K/uL   Basophils Relative 1 %   Basophils Absolute 0.1 0.0 - 0.1 K/uL   WBC Morphology DOHLE BODIES     Comment: MILD LEFT SHIFT (1-5% METAS, OCC MYELO, OCC BANDS) TOXIC GRANULATION    Immature Granulocytes 5 %   Abs Immature Granulocytes 0.37 (H) 0.00 - 0.07 K/uL    Comment: Performed at [facility], [place] [address], [place], [place] [id number]  Basic metabolic panel     Status: Abnormal   Collection Time: 12/17/22  5:00 AM  Result Value Ref Range   Sodium 142 135 - 145 mmol/L   Potassium 4.1 3.5 - 5.1 mmol/L   Chloride 107 98 - 111 mmol/L   CO2 28 22 - 32 mmol/L   Glucose, Bld 85 70 - 99 mg/dL    Comment: Glucose reference range applies only to samples taken after fasting for at least 8 hours.   BUN 18 8 - 23 mg/dL   Creatinine, Ser 1.27 (H) 0.44 - 1.00 mg/dL   Calcium 7.4 (L) 8.9 - 10.3 mg/dL   GFR, Estimated 44 (L) >60 mL/min    Comment: (NOTE) Calculated using the CKD-EPI Creatinine Equation (2021)    Anion gap 7 5 - 15    Comment: Performed at [facility], [place] [address], [place], [place] [address]  Magnesium     Status:  Abnormal   Collection Time: 12/17/22  5:00 AM  Result Value Ref Range   Magnesium 1.6 (L) 1.7 - 2.4 mg/dL    Comment: Performed at [facility], [place] [address], [place],  [address]    Studies/Results: No results found.  Medications: I have reviewed the patient's current medications.  Assessment: Black diarrhea, hemoglobin yesterday was 7.4 and is 8 today C. difficile negative GI pathogen panel pending  Platelet count has improved to 45 Renal impairment, creatinine 1.27 with a GFR 44  Non-small cell lung cancer on chemotherapy, history of pulmonary embolism on Eliquis Chemotherapy-induced neutropenia, severe pancytopenia secondary to chemotherapy, Mucositis-on nystatin powder, Magic mouthwash with lidocaine, hydrocortisone  Plan: Advance diet to full liquids, continue conservative management with pantoprazole 40 mg every 12 hours and sucralfate 1 g 4 times a day. If platelet count remains stable, is above 50,000 and patient has further evidence of melena, will plan endoscopy. However, if there  is resolution of melena, hemoglobin remained stable, plan conservative management with PPI and sucralfate.  Ronnette Juniper, MD 12/17/2022, 2:55 PM

## 2022-12-17 NOTE — Progress Notes (Signed)
PROGRESS NOTE    Crystal [name]  [id number] DOB: September 21, 1946 DOA: 12/08/2022 PCP: Gaynelle Arabian, MD     Brief Narrative:  Crystal [name] is a 77 y.o. female with medical history significant for pulmonary embolism on Eliquis, non-small cell lung cancer who just completed cycle 1 of carboplatin/Alimta/Pembrolizumab 11/29/2022 under the care of Dr. Benay Spice and is being admitted to the hospital with neutropenia and skin breakdown.  Unfortunately on 12/03/2022 she started to develop sores and diarrhea unable to take anything by mouth due to severe pain.  She also developed a rash in the lower abdomen, which looks significantly worse today, with increasing erythema, and some bloody drainage intermittently.  Oncology recommended admission for supportive care with IV fluids, and empiric IV antibiotics given her neutropenic status.    Hospital course complicated by severe pancytopenia, chemotherapy-induced.  On G CSF and prophylactic IV antibiotics.  She has required blood and platelet transfusions.  She developed watery black stools and GI was consulted.   New events last 24 hours / Subjective: Doing well, no further watery black stools since yesterday afternoon.   Assessment & Plan:   Principal Problem:   Neutropenia ([place]) Active Problems:   Essential hypertension   Dyslipidemia, goal LDL below 100   Nonischemic cardiomyopathy (HCC)   Hypokalemia   Metastatic non-small cell lung cancer ([place])   Malignant pleural effusion   CKD (chronic kidney disease), stage III (HCC)   Chronic HFrEF (heart failure with reduced ejection fraction) (HCC)   Cellulitis   Chemotherapy-induced neutropenia -Completed rocephin and vancomycin -Blood culture negative  -Appreciate oncology   Severe pancytopenia secondary to chemotherapy  -Home Eliquis stopped -Appreciate oncology   GI bleed -FOBT positive  -Transfused 1 unit packed red blood cell and 1 unit platelet in setting of pancytopenia -IV PPI   -On clear liquid diet  -GI consulted, cannot undergo endoscopic intervention unless platelet > 50,000  -C Diff negative -GI PCR pending  -Hgb stable today    Persistent small right pleural effusion with new small amount of loculated fluid in the fissure -Right-sided chest tube in place, output 143m 2/23  -Fluid culture is negative to date  -Fluid is exudative    Mucositis -Severe mouth sores and severe rash in her groin area -Continue Magic mouthwash with lidocaine.  Finished Diflucan 100 mg x 2 -Continue nystatin powder, hydrocortisone cream -Continue supportive care   Hypertension -Metoprolol   Hypomagnesemia  -Replace     CKD stage IIIa -Stable    Metastatic non-small cell lung cancer -Diagnosed in November 2023.  Underwent radiation therapy in December and currently on chemo. -Followed by oncology   History of recurrent PE -Eliquis is stopped due to severe thrombocytopenia   Chronic HFrEF -Echo from 11/12/2022 ejection fraction of 45 to 50% with grade 1 diastolic dysfunction -Stable    Anxiety -Continue Ativan as needed   Morbid obesity -BMI of 35     DVT prophylaxis: Eliquis on hold (last dose 2/17)  Code Status: DNR Family Communication: Sisters and niece at bedside  Disposition Plan:  Status is: Inpatient Remains inpatient appropriate because: pancytopenia   Consultants:  Oncology   Procedures:  None   Antimicrobials:  Anti-infectives (From admission, onward)    Start     Dose/Rate Route Frequency Ordered Stop   12/14/22 1400  vancomycin (VANCOREADY) IVPB 750 mg/150 mL        750 mg 150 mL/hr over 60 Minutes Intravenous Every 24 hours 12/14/22 1206 12/14/22 1513   12/10/22  1315  vancomycin (VANCOREADY) IVPB 750 mg/150 mL  Status:  Discontinued        750 mg 150 mL/hr over 60 Minutes Intravenous Every 24 hours 12/10/22 1309 12/14/22 1206   12/09/22 1000  fluconazole (DIFLUCAN) tablet 100 mg        100 mg Oral Daily 12/08/22 1441 12/10/22  0913   12/08/22 1600  cefTRIAXone (ROCEPHIN) 2 g in sodium chloride 0.9 % 100 mL IVPB        2 g 200 mL/hr over 30 Minutes Intravenous Every 24 hours 12/08/22 1441 12/14/22 1625   12/08/22 1600  vancomycin (VANCOREADY) IVPB 1250 mg/250 mL  Status:  Discontinued        1,250 mg 166.7 mL/hr over 90 Minutes Intravenous Every 48 hours 12/08/22 1450 12/10/22 1309        Objective: Vitals:   12/16/22 1452 12/16/22 1655 12/16/22 2138 12/17/22 0545  BP: 108/83 115/69 129/77 119/75  Pulse: 93 (!) 102 (!) 109 92  Resp: '14 16 17 18  '$ Temp: 98 F (36.7 C) 98.3 F (36.8 C) 97.8 F (36.6 C) 97.6 F (36.4 C)  TempSrc:  Oral Oral Oral  SpO2:  94% 93% 95%  Weight:      Height:        Intake/Output Summary (Last 24 hours) at 12/17/2022 1326 Last data filed at 12/16/2022 1655 Gross per 24 hour  Intake 792.5 ml  Output --  Net 792.5 ml    Filed Weights   12/08/22 1500  Weight: 95.7 kg    Examination:  General exam: Appears calm and comfortable  Respiratory system: Clear to auscultation. Respiratory effort normal. No respiratory distress. No conversational dyspnea.  Cardiovascular system: S1 & S2 heard, RRR. No murmurs. No pedal edema. Gastrointestinal system: Abdomen is nondistended, soft and nontender. Normal bowel sounds heard. Central nervous system: Alert and oriented. No focal neurological deficits. Speech clear.  Extremities: Symmetric in appearance  Skin: No rashes, lesions or ulcers on exposed skin  Psychiatry: Judgement and insight appear normal. Mood & affect appropriate.   Data Reviewed: I have personally reviewed following labs and imaging studies  CBC: Recent Labs  Lab 12/14/22 0450 12/15/22 0607 12/16/22 0500 12/16/22 2147 12/17/22 0500  WBC 2.2* 4.3 6.2 8.2 8.3  NEUTROABS 0.8* 2.4 3.7 7.5 5.7  HGB 8.8* 8.1* 7.1* 7.4* 8.0*  HCT 27.4* 25.6* 22.4* 22.6* 24.6*  MCV 93.5 93.1 92.2 92.6 92.8  PLT 5* 22* 11* 48* 45*    Basic Metabolic Panel: Recent Labs  Lab  12/13/22 0555 12/14/22 0450 12/15/22 0607 12/16/22 0500 12/17/22 0500  NA 143 140 140 140 142  K 3.4* 3.5 3.1* 3.3* 4.1  CL 107 107 103 104 107  CO2 '26 26 28 29 28  '$ GLUCOSE 95 91 94 104* 85  BUN '13 12 10 20 18  '$ CREATININE 1.13* 1.14* 0.50 1.11* 1.27*  CALCIUM 7.3* 7.0* 6.8* 6.9* 7.4*  MG 1.7 1.6* 1.6* 1.8 1.6*    GFR: Estimated Creatinine Clearance: 42.5 mL/min (A) (by C-G formula based on SCr of 1.27 mg/dL (H)). Liver Function Tests: Recent Labs  Lab 12/12/22 0635  AST 14*  ALT 10  ALKPHOS 38  BILITOT 0.5  PROT 5.2*  ALBUMIN 2.0*    No results for input(s): "LIPASE", "AMYLASE" in the last 168 hours. No results for input(s): "AMMONIA" in the last 168 hours. Coagulation Profile: No results for input(s): "INR", "PROTIME" in the last 168 hours. Cardiac Enzymes: No results for input(s): "CKTOTAL", "CKMB", "CKMBINDEX", "TROPONINI"  in the last 168 hours. BNP (last 3 results) No results for input(s): "PROBNP" in the last 8760 hours. HbA1C: No results for input(s): "HGBA1C" in the last 72 hours. CBG: No results for input(s): "GLUCAP" in the last 168 hours. Lipid Profile: No results for input(s): "CHOL", "HDL", "LDLCALC", "TRIG", "CHOLHDL", "LDLDIRECT" in the last 72 hours. Thyroid Function Tests: No results for input(s): "TSH", "T4TOTAL", "FREET4", "T3FREE", "THYROIDAB" in the last 72 hours. Anemia Panel: No results for input(s): "VITAMINB12", "FOLATE", "FERRITIN", "TIBC", "IRON", "RETICCTPCT" in the last 72 hours. Sepsis Labs: No results for input(s): "PROCALCITON", "LATICACIDVEN" in the last 168 hours.   Recent Results (from the past 240 hour(s))  Culture, blood (Routine X 2) w Reflex to ID Panel     Status: None   Collection Time: 12/08/22  3:31 PM   Specimen: BLOOD LEFT ARM  Result Value Ref Range Status   Specimen Description   Final    BLOOD LEFT ARM Performed at [facility], [address], [place], [place] [address]    Special Requests   Final     BOTTLES DRAWN AEROBIC ONLY Blood Culture adequate volume Performed at [facility] [address] [place], [place] [address]    Culture   Final    NO GROWTH 5 DAYS Performed at [facility], [place] [address], [place], [place] [address]    Report Status 12/13/2022 FINAL  Final  Culture, blood (Routine X 2) w Reflex to ID Panel     Status: None   Collection Time: 12/08/22  3:35 PM   Specimen: BLOOD  Result Value Ref Range Status   Specimen Description   Final    BLOOD AEROBIC BOTTLE ONLY Performed at [facility] [address], [place], [place] [address]    Special Requests   Final    BOTTLES DRAWN AEROBIC ONLY Blood Culture adequate volume Performed at [facility] [address], [place], [place] [address]    Culture   Final    NO GROWTH 5 DAYS Performed at [facility], [place] [address], [place], [place] [address]    Report Status 12/13/2022 FINAL  Final  Body fluid culture w Gram Stain     Status: None (Preliminary result)   Collection Time: 12/14/22 10:04 AM   Specimen: Pleural Fluid  Result Value Ref Range Status   Specimen Description   Final    PLEURAL Performed at [facility] [address] [place], [place] [address]    Special Requests   Final    NONE Performed at [facility], [place] [address] [place], [place] [address]    Gram Stain   Final    RARE WBC PRESENT, PREDOMINANTLY MONONUCLEAR NO ORGANISMS SEEN    Culture   Final    NO GROWTH 2 DAYS Performed at [facility], [place] [address], [place],  [address]    Report Status PENDING  Incomplete  C Difficile Quick Screen (NO PCR Reflex)     Status: None   Collection Time: 12/16/22  4:00 PM   Specimen: Stool  Result Value Ref Range Status   C Diff antigen NEGATIVE NEGATIVE Final   C Diff toxin NEGATIVE NEGATIVE Final   C Diff interpretation No C. difficile detected.  Final    Comment:  Performed at [facility], [place] [address] [place] [address], [place],  [address]      Radiology Studies: No results found.  Scheduled Meds:  Chlorhexidine Gluconate Cloth  6 each Topical Daily   feeding supplement  237 mL Oral BID BM   folic acid  1 mg Oral Daily   metoprolol tartrate  100 mg Oral BID   multivitamin with minerals  1 tablet Oral Daily   nystatin  1 Application Topical TID   oxyCODONE  5 mg Oral Q6H   pantoprazole (PROTONIX) IV  40 mg Intravenous Q12H   sucralfate  1 g Oral TID WC & HS   Continuous Infusions:     LOS: 9 days   Time spent: 25 minutes   Dessa Phi, DO [facility] 12/17/2022, 1:26 PM   Available via Epic secure chat 7am-7pm After these hours, please refer to coverage provider listed on [email or web address]

## 2022-12-18 ENCOUNTER — Other Ambulatory Visit: Payer: Self-pay | Admitting: Oncology

## 2022-12-18 DIAGNOSIS — K921 Melena: Secondary | ICD-10-CM | POA: Diagnosis not present

## 2022-12-18 DIAGNOSIS — C3490 Malignant neoplasm of unspecified part of unspecified bronchus or lung: Secondary | ICD-10-CM | POA: Diagnosis not present

## 2022-12-18 DIAGNOSIS — D696 Thrombocytopenia, unspecified: Secondary | ICD-10-CM | POA: Diagnosis not present

## 2022-12-18 DIAGNOSIS — D701 Agranulocytosis secondary to cancer chemotherapy: Secondary | ICD-10-CM | POA: Diagnosis not present

## 2022-12-18 DIAGNOSIS — T451X5A Adverse effect of antineoplastic and immunosuppressive drugs, initial encounter: Secondary | ICD-10-CM | POA: Diagnosis not present

## 2022-12-18 DIAGNOSIS — R197 Diarrhea, unspecified: Secondary | ICD-10-CM | POA: Diagnosis not present

## 2022-12-18 LAB — BASIC METABOLIC PANEL
Anion gap: 7 (ref 5–15)
BUN: 14 mg/dL (ref 8–23)
CO2: 30 mmol/L (ref 22–32)
Calcium: 7.4 mg/dL — ABNORMAL LOW (ref 8.9–10.3)
Chloride: 104 mmol/L (ref 98–111)
Creatinine, Ser: 1.24 mg/dL — ABNORMAL HIGH (ref 0.44–1.00)
GFR, Estimated: 45 mL/min — ABNORMAL LOW (ref >60)
Glucose, Bld: 93 mg/dL (ref 70–99)
Potassium: 3.2 mmol/L — ABNORMAL LOW (ref 3.5–5.1)
Sodium: 141 mmol/L (ref 135–145)

## 2022-12-18 LAB — CBC WITH DIFFERENTIAL/PLATELET
Abs Immature Granulocytes: 0.31 10*3/uL — ABNORMAL HIGH (ref 0.00–0.07)
Basophils Absolute: 0 10*3/uL (ref 0.0–0.1)
Basophils Relative: 0 %
Eosinophils Absolute: 0 10*3/uL (ref 0.0–0.5)
Eosinophils Relative: 0 %
HCT: 23.8 % — ABNORMAL LOW (ref 36.0–46.0)
Hemoglobin: 7.6 g/dL — ABNORMAL LOW (ref 12.0–15.0)
Immature Granulocytes: 4 %
Lymphocytes Relative: 20 %
Lymphs Abs: 1.5 10*3/uL (ref 0.7–4.0)
MCH: 29.9 pg (ref 26.0–34.0)
MCHC: 31.9 g/dL (ref 30.0–36.0)
MCV: 93.7 fL (ref 80.0–100.0)
Monocytes Absolute: 0.6 10*3/uL (ref 0.1–1.0)
Monocytes Relative: 8 %
Neutro Abs: 5 10*3/uL (ref 1.7–7.7)
Neutrophils Relative %: 68 %
Platelets: 33 10*3/uL — ABNORMAL LOW (ref 150–400)
RBC: 2.54 MIL/uL — ABNORMAL LOW (ref 3.87–5.11)
RDW: 14.5 % (ref 11.5–15.5)
WBC: 7.5 10*3/uL (ref 4.0–10.5)
nRBC: 0 % (ref 0.0–0.2)

## 2022-12-18 LAB — TYPE AND SCREEN
ABO/RH(D): A NEG
Antibody Screen: NEGATIVE
Unit division: 0

## 2022-12-18 LAB — BODY FLUID CULTURE W GRAM STAIN: Culture: NO GROWTH

## 2022-12-18 LAB — BPAM RBC
Blood Product Expiration Date: 202403092359
ISSUE DATE / TIME: 202402241138
Unit Type and Rh: 600

## 2022-12-18 LAB — BPAM PLATELET PHERESIS
Blood Product Expiration Date: 202402252359
ISSUE DATE / TIME: 202402241424
Unit Type and Rh: 5100

## 2022-12-18 LAB — PREPARE PLATELET PHERESIS: Unit division: 0

## 2022-12-18 LAB — MAGNESIUM: Magnesium: 1.9 mg/dL (ref 1.7–2.4)

## 2022-12-18 MED ORDER — POTASSIUM CHLORIDE CRYS ER 20 MEQ PO TBCR
40.0000 meq | EXTENDED_RELEASE_TABLET | ORAL | Status: AC
  Administered 2022-12-18 (×2): 40 meq via ORAL
  Filled 2022-12-18 (×2): qty 2

## 2022-12-18 MED ORDER — SUCRALFATE 1 G PO TABS: 1.0000 g | ORAL_TABLET | Freq: Three times a day (TID) | ORAL | 1 refills | Status: DC

## 2022-12-18 MED ORDER — PANTOPRAZOLE SODIUM 40 MG PO TBEC: 40.0000 mg | DELAYED_RELEASE_TABLET | Freq: Two times a day (BID) | ORAL | 1 refills | Status: DC

## 2022-12-18 NOTE — Progress Notes (Signed)
[facility] Gastroenterology Progress Note  Crystal [name] 77 y.o. February 18, 1946  CC:  Melena   Subjective: Patient states she has not had melena since Friday 2/23. Tolerating diet without difficulty. Denies abdominal pain, nausea, vomiting.  ROS : Review of Systems  Constitutional:  Negative for chills, fever and malaise/fatigue.  Gastrointestinal:  Negative for abdominal pain, blood in stool, constipation, diarrhea, heartburn, melena, nausea and vomiting.      Objective: Vital signs in last 24 hours: Vitals:   12/17/22 2022 12/18/22 0607  BP: 125/67 132/77  Pulse: 97 89  Resp: 17 18  Temp: 97.8 F (36.6 C) 98.1 F (36.7 C)  SpO2: 91% 94%    Physical Exam:  General:  Alert, cooperative, no distress, appears stated age  Head:  Normocephalic, without obvious abnormality, atraumatic  Eyes:  Anicteric sclera, EOM's intact  Lungs:   Clear to auscultation bilaterally, respirations unlabored  Heart:  Regular rate and rhythm, S1, S2 normal  Abdomen:   Soft, non-tender, bowel sounds active all four quadrants,  no masses,     Lab Results: Recent Labs    12/17/22 0500 12/18/22 0512  NA 142 141  K 4.1 3.2*  CL 107 104  CO2 28 30  GLUCOSE 85 93  BUN 18 14  CREATININE 1.27* 1.24*  CALCIUM 7.4* 7.4*  MG 1.6* 1.9   No results for input(s): "AST", "ALT", "ALKPHOS", "BILITOT", "PROT", "ALBUMIN" in the last 72 hours. Recent Labs    12/17/22 0500 12/18/22 0512  WBC 8.3 7.5  NEUTROABS 5.7 5.0  HGB 8.0* 7.6*  HCT 24.6* 23.8*  MCV 92.8 93.7  PLT 45* 33*   No results for input(s): "LABPROT", "INR" in the last 72 hours.    Assessment Melena, anemia -  hgb 7.6, stable - platelets 33 -  c diff and GI pathogen panel negative  NSCLC on chemo and history of PE on Eliquis. Chemo-induced neutropenia, severe pancytopenia secondary to chemo.   Plan: Tolerating full liquids without difficulty. No further melena at this time. Platelets continue to remain under 50,000 and hgb  remains stable Recommend continued conservative management with pantoprazole '40mg'$  BID and sucralfate 1 g 4 times daily If continue melena and platelet count rises to above 50,000 can consider egd. At this time, will continue conservative management [facility] will follow  Garnette Scheuermann PA-C 12/18/2022, 11:04 AM  Contact #  [phone]

## 2022-12-18 NOTE — Care Management Important Message (Signed)
Important Message  Patient Details IM Letter given. Name: Crystal [name] MRN: [id number] Date of Birth: 1945-12-25   Medicare Important Message Given:  Yes     Kerin Salen 12/18/2022, 10:34 AM

## 2022-12-18 NOTE — Discharge Summary (Signed)
Physician Discharge Summary  Crystal [name] [id number] DOB: 20-Oct-1946 DOA: 12/08/2022  PCP: Gaynelle Arabian, MD  Admit date: 12/08/2022 Discharge date: 12/18/2022  Admitted From: Home Disposition:  Home  Recommendations for Outpatient Follow-up:  Follow up with oncology, Dr. Benay Spice, scheduled 2/28 with repeat blood work Follow-up with GI as needed  Discharge Condition: Stable CODE STATUS: DNR  Diet recommendation: Regular diet  Brief/Interim Summary:  Crystal [name] is a 77 y.o. female with medical history significant for pulmonary embolism on Eliquis, non-small cell lung cancer who just completed cycle 1 of carboplatin/Alimta/Pembrolizumab 11/29/2022 under the care of Dr. Benay Spice and is being admitted to the hospital with neutropenia and skin breakdown.  Unfortunately on 12/03/2022 she started to develop sores and diarrhea unable to take anything by mouth due to severe pain.  She also developed a rash in the lower abdomen, which looks significantly worse today, with increasing erythema, and some bloody drainage intermittently.  Oncology recommended admission for supportive care with IV fluids, and empiric IV antibiotics given her neutropenic status.    Hospital course complicated by severe pancytopenia, chemotherapy-induced.  On G CSF and prophylactic IV antibiotics.  She has required blood and platelet transfusions.  She developed watery black stools and GI was consulted.  Patient was managed conservatively due to her thrombocytopenia.  Melena stopped and patient's count remained stable.  Patient was discharged home in stable condition with close follow-up by oncology team.    Discharge Diagnoses:   Principal Problem:   Neutropenia ([place]) Active Problems:   Essential hypertension   Dyslipidemia, goal LDL below 100   Nonischemic cardiomyopathy (HCC)   Hypokalemia   Metastatic non-small cell lung cancer ([place])   Malignant pleural effusion   CKD (chronic kidney disease), stage III  (HCC)   Chronic HFrEF (heart failure with reduced ejection fraction) (HCC)   Cellulitis  Chemotherapy-induced neutropenia -Completed rocephin and vancomycin -Blood culture negative  -Appreciate oncology   Severe pancytopenia secondary to chemotherapy  -Home Eliquis stopped -Appreciate oncology    GI bleed -FOBT positive  -Transfused 1 unit packed red blood cell and 1 unit platelet in setting of pancytopenia -PPI  -GI consulted, cannot undergo endoscopic intervention unless platelet > 50,000  -C Diff negative -GI PCR negative -Hgb stable today  -No further bleeding   Persistent small right pleural effusion with new small amount of loculated fluid in the fissure -Right-sided chest tube in place, output 130m 2/23  -Fluid culture is negative to date  -Fluid is exudative    Mucositis -Severe mouth sores and severe rash in her groin area -Continue Magic mouthwash with lidocaine.  Finished Diflucan 100 mg x 2 -Continue nystatin powder, hydrocortisone cream -Continue supportive care   Hypertension -Metoprolol    Hypokalemia  -Replace     CKD stage IIIa -Stable    Metastatic non-small cell lung cancer -Diagnosed in November 2023.  Underwent radiation therapy in December and currently on chemo. -Followed by oncology   History of recurrent PE -Eliquis is stopped due to severe thrombocytopenia   Chronic HFrEF -Echo from 11/12/2022 ejection fraction of 45 to 50% with grade 1 diastolic dysfunction -Stable    Anxiety -Continue Ativan as needed   Morbid obesity -BMI of 35  Discharge Instructions  Discharge Instructions     Call MD for:   Complete by: As directed    Any black or bloody stool   Call MD for:  difficulty breathing, headache or visual disturbances   Complete by: As directed  Call MD for:  extreme fatigue   Complete by: As directed    Call MD for:  persistant dizziness or light-headedness   Complete by: As directed    Call MD for:  persistant  nausea and vomiting   Complete by: As directed    Call MD for:  severe uncontrolled pain   Complete by: As directed    Call MD for:  temperature >100.4   Complete by: As directed    Diet general   Complete by: As directed    Discharge instructions   Complete by: As directed    .[email or web address]   Increase activity slowly   Complete by: As directed    No wound care   Complete by: As directed       Allergies as of 12/18/2022       Reactions   Simvastatin Other (See Comments)   Leg cramps   Zoloft [sertraline] Nausea Only   Codeine Other (See Comments)   Bad Headaches   Coreg Other (See Comments)   "Made my legs hurt"   Lisinopril Cough   Tizanidine Hcl Rash, Other (See Comments)   Hypotension, also        Medication List     STOP taking these medications    apixaban 5 MG Tabs tablet Commonly known as: ELIQUIS   fluconazole 100 MG tablet Commonly known as: DIFLUCAN   potassium chloride 10 MEQ tablet Commonly known as: KLOR-CON M       TAKE these medications    acetaminophen 325 MG tablet Commonly known as: TYLENOL Take 2 tablets (650 mg total) by mouth every 6 (six) hours as needed for mild pain or moderate pain.   diphenhydrAMINE 25 MG tablet Commonly known as: BENADRYL Take 50 mg by mouth at bedtime.   folic acid 1 MG tablet Commonly known as: FOLVITE Take 1 tablet (1 mg total) by mouth daily.   lidocaine-prilocaine cream Commonly known as: EMLA Apply 1 Application topically as needed (Apply to port site an hour before port to be accessed).   LORazepam 0.5 MG tablet Commonly known as: ATIVAN Take 1 tablet (0.5 mg total) by mouth 2 (two) times daily as needed. What changed: reasons to take this   magic mouthwash (nystatin, diphenhydrAMINE, alum & mag hydroxide) suspension mixture Swish 5 mL by mouth for 1 minute then spit; may use up to four times daily. What changed:  how much to take how to take this when to take this additional instructions    magnesium oxide 400 (240 Mg) MG tablet Commonly known as: MAG-OX Take 1 tablet (400 mg total) by mouth daily.   metoprolol tartrate 100 MG tablet Commonly known as: LOPRESSOR Take 1 tablet (100 mg total) by mouth 2 (two) times daily.   nystatin powder Commonly known as: MYCOSTATIN/NYSTOP Apply 1 Application topically 3 (three) times daily.   ondansetron 8 MG tablet Commonly known as: ZOFRAN Take 1 tablet (8 mg total) by mouth every 8 (eight) hours as needed for nausea or vomiting (starting using day 3 after treatment as needed for nausea/vomiting).   oxyCODONE 5 MG immediate release tablet Commonly known as: Oxy IR/ROXICODONE Take 1 tablet (5 mg total) by mouth every 6 (six) hours as needed for severe pain.   pantoprazole 40 MG tablet Commonly known as: Protonix Take 1 tablet (40 mg total) by mouth 2 (two) times daily before a meal.   prochlorperazine 10 MG tablet Commonly known as: COMPAZINE Take 1 tablet (10 mg total) by mouth  every 6 (six) hours as needed for nausea or vomiting.   sucralfate 1 g tablet Commonly known as: CARAFATE Take 1 tablet (1 g total) by mouth 4 (four) times daily -  with meals and at bedtime.        Follow-up Information     Gaynelle Arabian, MD Follow up.   Specialty: Family Medicine Contact information: [address] [place] [address] [phone]         Ladell Pier, MD. Go on 12/20/2022.   Specialty: Oncology Contact information: [place] [address] [phone]         Ronnette Juniper, MD Follow up.   Specialty: Gastroenterology Why: As needed, If symptoms worsen Contact information: [address] [place] [address] [phone]                Allergies  Allergen Reactions   Simvastatin Other (See Comments)    Leg cramps   Zoloft [Sertraline] Nausea Only   Codeine Other (See Comments)    Bad Headaches   Coreg Other (See Comments)    "Made my legs  hurt"   Lisinopril Cough   Tizanidine Hcl Rash and Other (See Comments)    Hypotension, also      Procedures/Studies: DG CHEST PORT 1 VIEW  Result Date: 12/12/2022 CLINICAL DATA:  Right chest tube dysfunction. EXAM: PORTABLE CHEST 1 VIEW COMPARISON:  Chest x-ray dated November 16, 2022. FINDINGS: Unchanged right chest wall port catheter and right-sided chest tube. The heart size and mediastinal contours are within normal limits. Persistent small right pleural effusion with new small amount of loculated fluid in the fissure. Increased mild right basilar atelectasis. Known right middle lobe mass is grossly unchanged. The left lung is clear. No pneumothorax. No acute osseous abnormality. IMPRESSION: 1. Persistent small right pleural effusion with new small amount of loculated fluid in the fissure. Right-sided chest tube is unchanged. Electronically Signed   By: Titus Dubin M.D.   On: 12/12/2022 12:45       Discharge Exam: Vitals:   12/17/22 2022 12/18/22 0607  BP: 125/67 132/77  Pulse: 97 89  Resp: 17 18  Temp: 97.8 F (36.6 C) 98.1 F (36.7 C)  SpO2: 91% 94%    General: Pt is alert, awake, not in acute distress Cardiovascular: RRR, S1/S2 +, no edema Respiratory: CTA bilaterally, no wheezing, no rhonchi, no respiratory distress, no conversational dyspnea  Abdominal: Soft, NT, ND, bowel sounds + Extremities: no edema, no cyanosis Psych: Normal mood and affect, stable judgement and insight     The results of significant diagnostics from this hospitalization (including imaging, microbiology, ancillary and laboratory) are listed below for reference.     Microbiology: Recent Results (from the past 240 hour(s))  Culture, blood (Routine X 2) w Reflex to ID Panel     Status: None   Collection Time: 12/08/22  3:31 PM   Specimen: BLOOD LEFT ARM  Result Value Ref Range Status   Specimen Description   Final    BLOOD LEFT ARM Performed at [facility], [address],  [place], [place] [address]    Special Requests   Final    BOTTLES DRAWN AEROBIC ONLY Blood Culture adequate volume Performed at [facility] [address] [place], [place] [address]    Culture   Final    NO GROWTH 5 DAYS Performed at [facility], [place] [address] [place], [place] [address]  Report Status 12/13/2022 FINAL  Final  Culture, blood (Routine X 2) w Reflex to ID Panel     Status: None   Collection Time: 12/08/22  3:35 PM   Specimen: BLOOD  Result Value Ref Range Status   Specimen Description   Final    BLOOD AEROBIC BOTTLE ONLY Performed at [facility] [address] [place], [place] [address]    Special Requests   Final    BOTTLES DRAWN AEROBIC ONLY Blood Culture adequate volume Performed at [facility] [address], [place], [place] [address]    Culture   Final    NO GROWTH 5 DAYS Performed at [facility], [place] [address] [place] [address], [place], [place] [address]    Report Status 12/13/2022 FINAL  Final  Body fluid culture w Gram Stain     Status: None   Collection Time: 12/14/22 10:04 AM   Specimen: Pleural Fluid  Result Value Ref Range Status   Specimen Description   Final    PLEURAL Performed at [facility] [address], [place], [place] [address]    Special Requests   Final    NONE Performed at [facility], [place] [address], [place], [place] [address]    Gram Stain   Final    RARE WBC PRESENT, PREDOMINANTLY MONONUCLEAR NO ORGANISMS SEEN    Culture   Final    NO GROWTH 3 DAYS Performed at [facility], [place] [address], [place], [place] [address]    Report Status 12/18/2022 FINAL  Final  Gastrointestinal Panel by PCR , Stool     Status: None   Collection Time: 12/16/22  4:00 PM   Specimen: Stool  Result Value Ref Range Status   Campylobacter species NOT DETECTED NOT DETECTED Final   Plesimonas shigelloides NOT DETECTED NOT DETECTED Final    Salmonella species NOT DETECTED NOT DETECTED Final   Yersinia enterocolitica NOT DETECTED NOT DETECTED Final   Vibrio species NOT DETECTED NOT DETECTED Final   Vibrio cholerae NOT DETECTED NOT DETECTED Final   Enteroaggregative E coli (EAEC) NOT DETECTED NOT DETECTED Final   Enteropathogenic E coli (EPEC) NOT DETECTED NOT DETECTED Final   Enterotoxigenic E coli (ETEC) NOT DETECTED NOT DETECTED Final   Shiga like toxin producing E coli (STEC) NOT DETECTED NOT DETECTED Final   Shigella/Enteroinvasive E coli (EIEC) NOT DETECTED NOT DETECTED Final   Cryptosporidium NOT DETECTED NOT DETECTED Final   Cyclospora cayetanensis NOT DETECTED NOT DETECTED Final   Entamoeba histolytica NOT DETECTED NOT DETECTED Final   Giardia lamblia NOT DETECTED NOT DETECTED Final   Adenovirus F40/41 NOT DETECTED NOT DETECTED Final   Astrovirus NOT DETECTED NOT DETECTED Final   Norovirus GI/GII NOT DETECTED NOT DETECTED Final   Rotavirus A NOT DETECTED NOT DETECTED Final   Sapovirus (I, II, IV, and V) NOT DETECTED NOT DETECTED Final    Comment: Performed at [facility], [place] [place], [place] [address]  C Difficile Quick Screen (NO PCR Reflex)     Status: None   Collection Time: 12/16/22  4:00 PM   Specimen: Stool  Result Value Ref Range Status   C Diff antigen NEGATIVE NEGATIVE Final   C Diff toxin NEGATIVE NEGATIVE Final   C Diff interpretation No C. difficile detected.  Final    Comment: Performed at [facility], [place] [address], [place], [place] [address]     Labs: BNP (last 3 results) Recent  Labs    11/11/22 2103  BNP [id number]*   Basic Metabolic Panel: Recent Labs  Lab 12/14/22 0450 12/15/22 0607 12/16/22 0500 12/17/22 0500 12/18/22 0512  NA 140 140 140 142 141  K 3.5 3.1* 3.3* 4.1 3.2*  CL 107 103 104 107 104  CO2 '26 28 29 28 30  '$ GLUCOSE 91 94 104* 85 93  BUN '12 10 20 18 14  '$ CREATININE 1.14* 0.50 1.11* 1.27* 1.24*  CALCIUM 7.0* 6.8* 6.9* 7.4* 7.4*   MG 1.6* 1.6* 1.8 1.6* 1.9   Liver Function Tests: Recent Labs  Lab 12/12/22 0635  AST 14*  ALT 10  ALKPHOS 38  BILITOT 0.5  PROT 5.2*  ALBUMIN 2.0*   No results for input(s): "LIPASE", "AMYLASE" in the last 168 hours. No results for input(s): "AMMONIA" in the last 168 hours. CBC: Recent Labs  Lab 12/15/22 0607 12/16/22 0500 12/16/22 2147 12/17/22 0500 12/18/22 0512  WBC 4.3 6.2 8.2 8.3 7.5  NEUTROABS 2.4 3.7 7.5 5.7 5.0  HGB 8.1* 7.1* 7.4* 8.0* 7.6*  HCT 25.6* 22.4* 22.6* 24.6* 23.8*  MCV 93.1 92.2 92.6 92.8 93.7  PLT 22* 11* 48* 45* 33*   Cardiac Enzymes: No results for input(s): "CKTOTAL", "CKMB", "CKMBINDEX", "TROPONINI" in the last 168 hours. BNP: Invalid input(s): "POCBNP" CBG: No results for input(s): "GLUCAP" in the last 168 hours. D-Dimer No results for input(s): "DDIMER" in the last 72 hours. Hgb A1c No results for input(s): "HGBA1C" in the last 72 hours. Lipid Profile No results for input(s): "CHOL", "HDL", "LDLCALC", "TRIG", "CHOLHDL", "LDLDIRECT" in the last 72 hours. Thyroid function studies No results for input(s): "TSH", "T4TOTAL", "T3FREE", "THYROIDAB" in the last 72 hours.  Invalid input(s): "FREET3" Anemia work up No results for input(s): "VITAMINB12", "FOLATE", "FERRITIN", "TIBC", "IRON", "RETICCTPCT" in the last 72 hours. Urinalysis    Component Value Date/Time   COLORURINE RED (A) 11/16/2022 1902   APPEARANCEUR TURBID (A) 11/16/2022 1902   LABSPEC 1.020 11/16/2022 1902   LABSPEC 1.020 07/16/2017 1540   PHURINE 6.5 11/16/2022 1902   GLUCOSEU NEGATIVE 11/16/2022 1902   GLUCOSEU Negative 07/16/2017 1540   HGBUR LARGE (A) 11/16/2022 1902   BILIRUBINUR MODERATE (A) 11/16/2022 1902   BILIRUBINUR Negative 07/16/2017 1540   KETONESUR NEGATIVE 11/16/2022 1902   PROTEINUR 100 (A) 11/16/2022 1902   UROBILINOGEN 0.2 07/16/2017 1540   NITRITE POSITIVE (A) 11/16/2022 1902   LEUKOCYTESUR SMALL (A) 11/16/2022 1902   LEUKOCYTESUR Negative  07/16/2017 1540   Sepsis Labs Recent Labs  Lab 12/16/22 0500 12/16/22 2147 12/17/22 0500 12/18/22 0512  WBC 6.2 8.2 8.3 7.5   Microbiology Recent Results (from the past 240 hour(s))  Culture, blood (Routine X 2) w Reflex to ID Panel     Status: None   Collection Time: 12/08/22  3:31 PM   Specimen: BLOOD LEFT ARM  Result Value Ref Range Status   Specimen Description   Final    BLOOD LEFT ARM Performed at [facility], [place] [address] [place], [place] [address]    Special Requests   Final    BOTTLES DRAWN AEROBIC ONLY Blood Culture adequate volume Performed at [facility] [address], [place], [place] [address]    Culture   Final    NO GROWTH 5 DAYS Performed at [facility], [place] [address] [place], [place] [address]    Report Status 12/13/2022 FINAL  Final  Culture, blood (Routine X 2) w Reflex to ID Panel  Status: None   Collection Time: 12/08/22  3:35 PM   Specimen: BLOOD  Result Value Ref Range Status   Specimen Description   Final    BLOOD AEROBIC BOTTLE ONLY Performed at [facility] [address], [place], [place] [address]    Special Requests   Final    BOTTLES DRAWN AEROBIC ONLY Blood Culture adequate volume Performed at [facility] [address] [place], [place] [address]    Culture   Final    NO GROWTH 5 DAYS Performed at [facility], [place] [address] [place], [place] [address]    Report Status 12/13/2022 FINAL  Final  Body fluid culture w Gram Stain     Status: None   Collection Time: 12/14/22 10:04 AM   Specimen: Pleural Fluid  Result Value Ref Range Status   Specimen Description   Final    PLEURAL Performed at [facility] [address] [place], [place] [address]    Special Requests   Final    NONE Performed at [facility], [place] [address] [place], [place] [address]    Gram Stain   Final    RARE WBC PRESENT,  PREDOMINANTLY MONONUCLEAR NO ORGANISMS SEEN    Culture   Final    NO GROWTH 3 DAYS Performed at [facility], [place] [address] [place], [place] [address]    Report Status 12/18/2022 FINAL  Final  Gastrointestinal Panel by PCR , Stool     Status: None   Collection Time: 12/16/22  4:00 PM   Specimen: Stool  Result Value Ref Range Status   Campylobacter species NOT DETECTED NOT DETECTED Final   Plesimonas shigelloides NOT DETECTED NOT DETECTED Final   Salmonella species NOT DETECTED NOT DETECTED Final   Yersinia enterocolitica NOT DETECTED NOT DETECTED Final   Vibrio species NOT DETECTED NOT DETECTED Final   Vibrio cholerae NOT DETECTED NOT DETECTED Final   Enteroaggregative E coli (EAEC) NOT DETECTED NOT DETECTED Final   Enteropathogenic E coli (EPEC) NOT DETECTED NOT DETECTED Final   Enterotoxigenic E coli (ETEC) NOT DETECTED NOT DETECTED Final   Shiga like toxin producing E coli (STEC) NOT DETECTED NOT DETECTED Final   Shigella/Enteroinvasive E coli (EIEC) NOT DETECTED NOT DETECTED Final   Cryptosporidium NOT DETECTED NOT DETECTED Final   Cyclospora cayetanensis NOT DETECTED NOT DETECTED Final   Entamoeba histolytica NOT DETECTED NOT DETECTED Final   Giardia lamblia NOT DETECTED NOT DETECTED Final   Adenovirus F40/41 NOT DETECTED NOT DETECTED Final   Astrovirus NOT DETECTED NOT DETECTED Final   Norovirus GI/GII NOT DETECTED NOT DETECTED Final   Rotavirus A NOT DETECTED NOT DETECTED Final   Sapovirus (I, II, IV, and V) NOT DETECTED NOT DETECTED Final    Comment: Performed at [facility], [place] [place], [place] [address]  C Difficile Quick Screen (NO PCR Reflex)     Status: None   Collection Time: 12/16/22  4:00 PM   Specimen: Stool  Result Value Ref Range Status   C Diff antigen NEGATIVE NEGATIVE Final   C Diff toxin NEGATIVE NEGATIVE Final   C Diff interpretation No C. difficile detected.  Final    Comment: Performed at [facility],  [place] [address], [place], [place] [address]     Patient was seen and examined on the day of discharge and was found to be in stable condition. Time coordinating discharge: 35 minutes including assessment and  coordination of care, as well as examination of the patient.   SIGNED:  Dessa Phi, DO [facility] 12/18/2022, 12:55 PM
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
## 2022-12-18 NOTE — Progress Notes (Signed)
Day RN reported that patient did not feel it was necessary to be drained today. She reported that the past 2 days her output was 150 and 50. Patient reports she will have pleurx drained tomorrow. No complaints of shortness of breath or discomfort at this time.Roderick Pee

## 2022-12-18 NOTE — Progress Notes (Signed)
DISCONTINUE OFF PATHWAY REGIMEN - Non-Small Cell Lung   [id number]:Pembrolizumab 200 mg  IV D1 + Pemetrexed 500 mg/m2 IV D1 + Carboplatin AUC=5 IV D1 q21 Days:   A cycle is every 21 days:     Pembrolizumab      Pemetrexed      Carboplatin   **Always confirm dose/schedule in your pharmacy ordering system**  REASON: Toxicities / Adverse Event PRIOR TREATMENT: Off Pathway: Pembrolizumab 200 mg  IV D1 + Pemetrexed 500 mg/m2 IV D1 + Carboplatin AUC=5 IV D1 q21 Days TREATMENT RESPONSE: Unable to Evaluate  START ON PATHWAY REGIMEN - Non-Small Cell Lung     A cycle is every 21 days:     Pemetrexed      Carboplatin   **Always confirm dose/schedule in your pharmacy ordering system**  Patient Characteristics: Stage IV Metastatic, Nonsquamous, Awaiting Molecular Test Results and Need to Start Chemotherapy, PS = 0, 1 Therapeutic Status: Stage IV Metastatic Histology: Nonsquamous Cell Broad Molecular Profiling Status: Awaiting Molecular Test Results and Need to Start Chemotherapy ECOG Performance Status: 1 Intent of Therapy: Non-Curative / Palliative Intent, Discussed with Patient

## 2022-12-19 ENCOUNTER — Other Ambulatory Visit: Payer: Self-pay | Admitting: *Deleted

## 2022-12-19 DIAGNOSIS — C3490 Malignant neoplasm of unspecified part of unspecified bronchus or lung: Secondary | ICD-10-CM

## 2022-12-20 ENCOUNTER — Inpatient Hospital Stay: Payer: No Typology Code available for payment source | Admitting: Oncology

## 2022-12-20 ENCOUNTER — Telehealth: Payer: Self-pay | Admitting: *Deleted

## 2022-12-20 ENCOUNTER — Inpatient Hospital Stay: Payer: No Typology Code available for payment source

## 2022-12-20 ENCOUNTER — Encounter: Payer: Self-pay | Admitting: *Deleted

## 2022-12-20 VITALS — BP 105/63 | HR 89 | Temp 98.1°F | Resp 18 | Ht 65.0 in | Wt 211.0 lb

## 2022-12-20 DIAGNOSIS — K1379 Other lesions of oral mucosa: Secondary | ICD-10-CM | POA: Diagnosis not present

## 2022-12-20 DIAGNOSIS — Z5111 Encounter for antineoplastic chemotherapy: Secondary | ICD-10-CM | POA: Diagnosis not present

## 2022-12-20 DIAGNOSIS — Z5112 Encounter for antineoplastic immunotherapy: Secondary | ICD-10-CM | POA: Diagnosis not present

## 2022-12-20 DIAGNOSIS — C3490 Malignant neoplasm of unspecified part of unspecified bronchus or lung: Secondary | ICD-10-CM | POA: Diagnosis not present

## 2022-12-20 DIAGNOSIS — R197 Diarrhea, unspecified: Secondary | ICD-10-CM | POA: Diagnosis not present

## 2022-12-20 DIAGNOSIS — C342 Malignant neoplasm of middle lobe, bronchus or lung: Secondary | ICD-10-CM

## 2022-12-20 DIAGNOSIS — C7951 Secondary malignant neoplasm of bone: Secondary | ICD-10-CM | POA: Diagnosis not present

## 2022-12-20 DIAGNOSIS — Z79620 Long term (current) use of immunosuppressive biologic: Secondary | ICD-10-CM | POA: Diagnosis not present

## 2022-12-20 LAB — CBC WITH DIFFERENTIAL (CANCER CENTER ONLY)
Abs Immature Granulocytes: 0.5 10*3/uL — ABNORMAL HIGH (ref 0.00–0.07)
Band Neutrophils: 1 %
Basophils Absolute: 0 10*3/uL (ref 0.0–0.1)
Basophils Relative: 0 %
Eosinophils Absolute: 0 10*3/uL (ref 0.0–0.5)
Eosinophils Relative: 0 %
HCT: 24.4 % — ABNORMAL LOW (ref 36.0–46.0)
Hemoglobin: 8.1 g/dL — ABNORMAL LOW (ref 12.0–15.0)
Lymphocytes Relative: 13 %
Lymphs Abs: 1 10*3/uL (ref 0.7–4.0)
MCH: 30 pg (ref 26.0–34.0)
MCHC: 33.2 g/dL (ref 30.0–36.0)
MCV: 90.4 fL (ref 80.0–100.0)
Metamyelocytes Relative: 3 %
Monocytes Absolute: 0.2 10*3/uL (ref 0.1–1.0)
Monocytes Relative: 3 %
Myelocytes: 3 %
Neutro Abs: 5.9 10*3/uL (ref 1.7–7.7)
Neutrophils Relative %: 76 %
Platelet Count: 35 10*3/uL — ABNORMAL LOW (ref 150–400)
Promyelocytes Relative: 1 %
RBC: 2.7 MIL/uL — ABNORMAL LOW (ref 3.87–5.11)
RDW: 14.5 % (ref 11.5–15.5)
WBC Count: 7.7 10*3/uL (ref 4.0–10.5)
nRBC: 0 % (ref 0.0–0.2)

## 2022-12-20 LAB — CMP (CANCER CENTER ONLY)
ALT: 10 U/L (ref 0–44)
AST: 20 U/L (ref 15–41)
Albumin: 2.7 g/dL — ABNORMAL LOW (ref 3.5–5.0)
Alkaline Phosphatase: 66 U/L (ref 38–126)
Anion gap: 5 (ref 5–15)
BUN: 11 mg/dL (ref 8–23)
CO2: 32 mmol/L (ref 22–32)
Calcium: 7.9 mg/dL — ABNORMAL LOW (ref 8.9–10.3)
Chloride: 104 mmol/L (ref 98–111)
Creatinine: 1.3 mg/dL — ABNORMAL HIGH (ref 0.44–1.00)
GFR, Estimated: 42 mL/min — ABNORMAL LOW (ref >60)
Glucose, Bld: 110 mg/dL — ABNORMAL HIGH (ref 70–99)
Potassium: 3.1 mmol/L — ABNORMAL LOW (ref 3.5–5.1)
Sodium: 141 mmol/L (ref 135–145)
Total Bilirubin: 0.3 mg/dL (ref 0.3–1.2)
Total Protein: 6 g/dL — ABNORMAL LOW (ref 6.5–8.1)

## 2022-12-20 LAB — TSH: TSH: 3.346 u[IU]/mL (ref 0.350–4.500)

## 2022-12-20 MED ORDER — POTASSIUM CHLORIDE CRYS ER 10 MEQ PO TBCR: 10.0000 meq | EXTENDED_RELEASE_TABLET | Freq: Two times a day (BID) | ORAL | 1 refills | Status: DC

## 2022-12-20 MED ORDER — SODIUM CHLORIDE 0.9% FLUSH
10.0000 mL | INTRAVENOUS | Status: DC | PRN
  Administered 2022-12-20: 10 mL via INTRAVENOUS

## 2022-12-20 MED ORDER — HEPARIN SOD (PORK) LOCK FLUSH 100 UNIT/ML IV SOLN
500.0000 [IU] | Freq: Once | INTRAVENOUS | Status: AC
  Administered 2022-12-20: 500 [IU] via INTRAVENOUS

## 2022-12-20 NOTE — Telephone Encounter (Signed)
Left VM that Dr. Benay Spice wants her to decrease the metoprolol to 25 mg bid. Requested she return call to confirm and if she can cut the 100 mg in 4 pieces or want nurse to call in 25 mg tablets?

## 2022-12-20 NOTE — Patient Instructions (Signed)
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
## 2022-12-22 LAB — T4: T4, Total: 6.4 ug/dL (ref 4.5–12.0)

## 2022-12-23 ENCOUNTER — Other Ambulatory Visit: Payer: Self-pay | Admitting: Oncology

## 2022-12-25 ENCOUNTER — Other Ambulatory Visit (HOSPITAL_COMMUNITY): Payer: Self-pay

## 2022-12-25 ENCOUNTER — Telehealth: Payer: Self-pay

## 2022-12-25 LAB — MISC LABCORP TEST (SEND OUT): Labcorp test code: 9985

## 2022-12-25 NOTE — Telephone Encounter (Signed)
Patient called and report she don't want chemo this month and she wants to cancel her appointment on Friday. I advice the patient to keep her appointment  and come in to discuss this with the provider. She agree to come in.

## 2022-12-29 ENCOUNTER — Encounter: Payer: Self-pay | Admitting: Nurse Practitioner

## 2022-12-29 ENCOUNTER — Inpatient Hospital Stay: Payer: No Typology Code available for payment source

## 2022-12-29 ENCOUNTER — Inpatient Hospital Stay: Payer: No Typology Code available for payment source | Attending: Oncology

## 2022-12-29 ENCOUNTER — Inpatient Hospital Stay (HOSPITAL_BASED_OUTPATIENT_CLINIC_OR_DEPARTMENT_OTHER): Payer: No Typology Code available for payment source | Admitting: Nurse Practitioner

## 2022-12-29 VITALS — BP 99/62 | HR 100 | Temp 98.2°F | Resp 17

## 2022-12-29 VITALS — BP 104/70 | HR 91 | Temp 97.9°F | Resp 18 | Ht 65.0 in | Wt 205.0 lb

## 2022-12-29 DIAGNOSIS — Z5111 Encounter for antineoplastic chemotherapy: Secondary | ICD-10-CM | POA: Insufficient documentation

## 2022-12-29 DIAGNOSIS — C342 Malignant neoplasm of middle lobe, bronchus or lung: Secondary | ICD-10-CM

## 2022-12-29 DIAGNOSIS — C3490 Malignant neoplasm of unspecified part of unspecified bronchus or lung: Secondary | ICD-10-CM | POA: Diagnosis not present

## 2022-12-29 DIAGNOSIS — R634 Abnormal weight loss: Secondary | ICD-10-CM | POA: Diagnosis not present

## 2022-12-29 DIAGNOSIS — Z5112 Encounter for antineoplastic immunotherapy: Secondary | ICD-10-CM | POA: Diagnosis not present

## 2022-12-29 DIAGNOSIS — Z5189 Encounter for other specified aftercare: Secondary | ICD-10-CM | POA: Diagnosis not present

## 2022-12-29 LAB — TSH: TSH: 3.845 u[IU]/mL (ref 0.350–4.500)

## 2022-12-29 LAB — CBC WITH DIFFERENTIAL (CANCER CENTER ONLY)
Abs Immature Granulocytes: 0.08 10*3/uL — ABNORMAL HIGH (ref 0.00–0.07)
Basophils Absolute: 0 10*3/uL (ref 0.0–0.1)
Basophils Relative: 1 %
Eosinophils Absolute: 0 10*3/uL (ref 0.0–0.5)
Eosinophils Relative: 0 %
HCT: 26.8 % — ABNORMAL LOW (ref 36.0–46.0)
Hemoglobin: 8.4 g/dL — ABNORMAL LOW (ref 12.0–15.0)
Immature Granulocytes: 1 %
Lymphocytes Relative: 17 %
Lymphs Abs: 1.4 10*3/uL (ref 0.7–4.0)
MCH: 29.6 pg (ref 26.0–34.0)
MCHC: 31.3 g/dL (ref 30.0–36.0)
MCV: 94.4 fL (ref 80.0–100.0)
Monocytes Absolute: 0.6 10*3/uL (ref 0.1–1.0)
Monocytes Relative: 8 %
Neutro Abs: 6.1 10*3/uL (ref 1.7–7.7)
Neutrophils Relative %: 73 %
Platelet Count: 427 10*3/uL — ABNORMAL HIGH (ref 150–400)
RBC: 2.84 MIL/uL — ABNORMAL LOW (ref 3.87–5.11)
RDW: 17 % — ABNORMAL HIGH (ref 11.5–15.5)
WBC Count: 8.2 10*3/uL (ref 4.0–10.5)
nRBC: 0 % (ref 0.0–0.2)

## 2022-12-29 LAB — CMP (CANCER CENTER ONLY)
ALT: 9 U/L (ref 0–44)
AST: 16 U/L (ref 15–41)
Albumin: 2.8 g/dL — ABNORMAL LOW (ref 3.5–5.0)
Alkaline Phosphatase: 65 U/L (ref 38–126)
Anion gap: 11 (ref 5–15)
BUN: 12 mg/dL (ref 8–23)
CO2: 26 mmol/L (ref 22–32)
Calcium: 7.8 mg/dL — ABNORMAL LOW (ref 8.9–10.3)
Chloride: 107 mmol/L (ref 98–111)
Creatinine: 1.47 mg/dL — ABNORMAL HIGH (ref 0.44–1.00)
GFR, Estimated: 37 mL/min — ABNORMAL LOW (ref >60)
Glucose, Bld: 122 mg/dL — ABNORMAL HIGH (ref 70–99)
Potassium: 3.2 mmol/L — ABNORMAL LOW (ref 3.5–5.1)
Sodium: 144 mmol/L (ref 135–145)
Total Bilirubin: 0.3 mg/dL (ref 0.3–1.2)
Total Protein: 6.4 g/dL — ABNORMAL LOW (ref 6.5–8.1)

## 2022-12-29 MED ORDER — POTASSIUM CHLORIDE 10 MEQ/100ML IV SOLN
10.0000 meq | INTRAVENOUS | Status: AC
  Administered 2022-12-29 (×2): 10 meq via INTRAVENOUS
  Filled 2022-12-29: qty 100

## 2022-12-29 MED ORDER — SODIUM CHLORIDE 0.9 % IV SOLN: Freq: Once | INTRAVENOUS | Status: AC

## 2022-12-29 MED ORDER — SODIUM CHLORIDE 0.9 % IV SOLN: INTRAVENOUS | Status: AC

## 2022-12-29 NOTE — Progress Notes (Signed)
Patient seen by Ned Card NP today  Vitals are within treatment parameters.  Labs reviewed by Ned Card NP and are within treatment parameters. K+ is 3.2.  Per physician team, patient will not be receiving treatment today.  Patient will be getting IVF and K+. The K+ order is in sign and held.

## 2022-12-29 NOTE — Progress Notes (Signed)
Pt states that she does not receive treatment today. Pt left accessed and [facility], LPN notified.

## 2022-12-29 NOTE — Patient Instructions (Signed)
Rehydration, Older Adult  Rehydration is the replacement of fluids, salts, and minerals in the body (electrolytes) that are lost during dehydration. Dehydration is when there is not enough water or other fluids in the body. This happens when you lose more fluids than you take in. People who are age 78 or older have a higher risk of dehydration than younger adults. This is because in older age, the body: Is less able to maintain the right amount of water. Does not respond to temperature changes as well. Does not get a sense of thirst as easily or quickly. Other causes include: Not drinking enough fluids. This can occur when you are ill, when you forget to drink, or when you are doing activities that require a lot of energy, especially in hot weather. Conditions that cause loss of water or other fluids. These include diarrhea, vomiting, sweating, or urinating a lot. Other illnesses, such as fever or infection. Certain medicines, such as those that remove excess fluid from the body (diuretics). Symptoms of mild or moderate dehydration may include thirst, dry lips and mouth, and dizziness. Symptoms of severe dehydration may include increased heart rate, confusion, fainting, and not urinating. In severe cases, you may need to get fluids through an IV at the hospital. For mild or moderate cases, you can usually rehydrate at home by drinking certain fluids as told by your health care provider. What are the risks? Rehydration is usually safe. Taking in too much fluid (overhydration) can be a problem but is rare. Overhydration can cause an imbalance of electrolytes in the body, kidney failure, fluid in the lungs, or a decrease in salt (sodium) levels in the body. Supplies needed: You will need an oral rehydration solution (ORS) if your health care provider tells you to use one. This is a drink to treat dehydration. It can be found in pharmacies and retail stores. How to rehydrate Fluids Follow  instructions from your health care provider about what to drink. The kind of fluid and the amount you should drink depend on your condition. In general, you should choose drinks that you prefer. If told by your health care provider, drink an ORS. Make an ORS by following instructions on the package. Start by drinking small amounts, about  cup (120 mL) every 5-10 minutes. Slowly increase how much you drink until you have taken in the amount recommended by your health care provider. Drink enough clear fluids to keep your urine pale yellow. If you were told to drink an ORS, finish it first, then start slowly drinking other clear fluids. Drink fluids such as: Water. This includes sparkling and flavored water. Drinking only water can lead to having too little sodium in your body (hyponatremia). Follow the advice of your health care provider. Water from ice chips you suck on. Fruit juice with water added to it(diluted). Sports drinks. Hot or cold herbal teas. Broth-based soups. Coffee. Milk or milk products. Food Follow instructions from your health care provider about what to eat while you rehydrate. Your health care provider may recommend that you slowly begin eating regular foods in small amounts. Eat foods that contain a healthy balance of electrolytes, such as bananas, oranges, potatoes, tomatoes, and spinach. Avoid foods that are greasy or contain a lot of sugar. In some cases, you may get nutrition through a feeding tube that is passed through your nose and into your stomach (nasogastric tube, or NG tube). This may be done if you have uncontrolled vomiting or diarrhea. Drinks to avoid  Certain drinks may make dehydration worse. While you rehydrate, avoid drinking alcohol. How to tell if you are recovering from dehydration You may be getting better if: You are urinating more often than before you started rehydrating. Your urine is pale yellow. Your energy level improves. You vomit less  often. You have diarrhea less often. Your appetite improves or returns to normal. You feel less dizzy or light-headed. Your skin tone and color start to look more normal. Follow these instructions at home: Take over-the-counter and prescription medicines only as told by your health care provider. Do not take sodium tablets. Doing this can lead to having too much sodium in your body (hypernatremia). Contact a health care provider if: You continue to have symptoms of mild or moderate dehydration, such as: Thirst. Dry lips. Slightly dry mouth. Dizziness. Dark urine or less urine than usual. Muscle cramps. You continue to vomit or have diarrhea. Get help right away if: You have symptoms of dehydration that get worse. You have a fever. You have a severe headache. You have been vomiting and have problems, such as: Your vomiting gets worse. Your vomit includes blood or green matter (bile). You cannot eat or drink without vomiting. You have problems with urination or bowel movements, such as: Diarrhea that gets worse. Blood in your stool (feces). This may cause stool to look black and tarry. Not urinating, or urinating only a small amount of very dark urine, within 6-8 hours. You have trouble breathing. You have symptoms that get worse with treatment. These symptoms may be an emergency. Get help right away. Call 911. Do not wait to see if the symptoms will go away. Do not drive yourself to the hospital. This information is not intended to replace advice given to you by your health care provider. Make sure you discuss any questions you have with your health care provider. Document Revised: 02/22/2022 Document Reviewed: 02/20/2022 Elsevier Patient Education  [place].  Hypokalemia Hypokalemia means that the amount of potassium in the blood is lower than normal. Potassium is a mineral (electrolyte) that helps regulate the amount of fluid in the body. It also stimulates muscle  tightening (contraction) and helps nerves work properly. Normally, most of the body's potassium is inside cells, and only a very small amount is in the blood. Because the amount in the blood is so small, minor changes to potassium levels in the blood can be life-threatening. What are the causes? This condition may be caused by: Antibiotic medicine. Diarrhea or vomiting. Taking too much of a medicine that helps you have a bowel movement (laxative) can cause diarrhea and lead to hypokalemia. Chronic kidney disease (CKD). Medicines that help the body get rid of excess fluid (diuretics). Eating disorders, such as anorexia or bulimia. Low magnesium levels in the body. Sweating a lot. What are the signs or symptoms? Symptoms of this condition include: Weakness. Constipation. Fatigue. Muscle cramps. Mental confusion. Skipped heartbeats or irregular heartbeat (palpitations). Tingling or numbness. How is this diagnosed? This condition is diagnosed with a blood test. How is this treated? This condition may be treated by: Taking potassium supplements. Adjusting the medicines that you take. Eating more foods that contain a lot of potassium. If your potassium level is very low, you may need to get potassium through an IV and be monitored in the hospital. Follow these instructions at home: Eating and drinking  Eat a healthy diet. A healthy diet includes fresh fruits and vegetables, whole grains, healthy fats, and lean proteins. If  told, eat more foods that contain a lot of potassium. These include: Nuts, such as peanuts and pistachios. Seeds, such as sunflower seeds and pumpkin seeds. Peas, lentils, and lima beans. Whole grain and bran cereals and breads. Fresh fruits and vegetables, such as apricots, avocado, bananas, cantaloupe, kiwi, oranges, tomatoes, asparagus, and potatoes. Juices, such as orange, tomato, and prune. Lean meats, including fish. Milk and milk products, such as  yogurt. General instructions Take over-the-counter and prescription medicines only as told by your health care provider. This includes vitamins, natural food products, and supplements. Keep all follow-up visits. This is important. Contact a health care provider if: You have weakness that gets worse. You feel your heart pounding or racing. You vomit. You have diarrhea. You have diabetes and you have trouble keeping your blood sugar in your target range. Get help right away if: You have chest pain. You have shortness of breath. You have vomiting or diarrhea that lasts for more than 2 days. You faint. These symptoms may be an emergency. Get help right away. Call 911. Do not wait to see if the symptoms will go away. Do not drive yourself to the hospital. Summary Hypokalemia means that the amount of potassium in the blood is lower than normal. This condition is diagnosed with a blood test. Hypokalemia may be treated by taking potassium supplements, adjusting the medicines that you take, or eating more foods that are high in potassium. If your potassium level is very low, you may need to get potassium through an IV and be monitored in the hospital. This information is not intended to replace advice given to you by your health care provider. Make sure you discuss any questions you have with your health care provider. Document Revised: 06/23/2021 Document Reviewed: 06/23/2021 Elsevier Patient Education  [place].
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
## 2022-12-30 ENCOUNTER — Other Ambulatory Visit: Payer: Self-pay | Admitting: Oncology

## 2022-12-30 LAB — T4: T4, Total: 7 ug/dL (ref 4.5–12.0)

## 2023-01-03 ENCOUNTER — Inpatient Hospital Stay: Payer: No Typology Code available for payment source

## 2023-01-03 ENCOUNTER — Inpatient Hospital Stay: Payer: No Typology Code available for payment source | Admitting: Nurse Practitioner

## 2023-01-03 ENCOUNTER — Encounter: Payer: Self-pay | Admitting: Nurse Practitioner

## 2023-01-03 VITALS — BP 98/72 | HR 92 | Temp 98.1°F | Resp 18 | Ht 65.0 in | Wt 207.0 lb

## 2023-01-03 DIAGNOSIS — C3490 Malignant neoplasm of unspecified part of unspecified bronchus or lung: Secondary | ICD-10-CM

## 2023-01-03 DIAGNOSIS — Z5111 Encounter for antineoplastic chemotherapy: Secondary | ICD-10-CM | POA: Diagnosis not present

## 2023-01-03 DIAGNOSIS — C342 Malignant neoplasm of middle lobe, bronchus or lung: Secondary | ICD-10-CM

## 2023-01-03 LAB — CMP (CANCER CENTER ONLY)
ALT: 11 U/L (ref 0–44)
AST: 17 U/L (ref 15–41)
Albumin: 2.8 g/dL — ABNORMAL LOW (ref 3.5–5.0)
Alkaline Phosphatase: 56 U/L (ref 38–126)
Anion gap: 7 (ref 5–15)
BUN: 11 mg/dL (ref 8–23)
CO2: 28 mmol/L (ref 22–32)
Calcium: 7.9 mg/dL — ABNORMAL LOW (ref 8.9–10.3)
Chloride: 107 mmol/L (ref 98–111)
Creatinine: 1.34 mg/dL — ABNORMAL HIGH (ref 0.44–1.00)
GFR, Estimated: 41 mL/min — ABNORMAL LOW (ref >60)
Glucose, Bld: 168 mg/dL — ABNORMAL HIGH (ref 70–99)
Potassium: 3.2 mmol/L — ABNORMAL LOW (ref 3.5–5.1)
Sodium: 142 mmol/L (ref 135–145)
Total Bilirubin: 0.2 mg/dL — ABNORMAL LOW (ref 0.3–1.2)
Total Protein: 6.1 g/dL — ABNORMAL LOW (ref 6.5–8.1)

## 2023-01-03 LAB — CBC WITH DIFFERENTIAL (CANCER CENTER ONLY)
Abs Immature Granulocytes: 0.03 10*3/uL (ref 0.00–0.07)
Basophils Absolute: 0 10*3/uL (ref 0.0–0.1)
Basophils Relative: 1 %
Eosinophils Absolute: 0 10*3/uL (ref 0.0–0.5)
Eosinophils Relative: 0 %
HCT: 25.5 % — ABNORMAL LOW (ref 36.0–46.0)
Hemoglobin: 8.1 g/dL — ABNORMAL LOW (ref 12.0–15.0)
Immature Granulocytes: 1 %
Lymphocytes Relative: 23 %
Lymphs Abs: 1.3 10*3/uL (ref 0.7–4.0)
MCH: 30.3 pg (ref 26.0–34.0)
MCHC: 31.8 g/dL (ref 30.0–36.0)
MCV: 95.5 fL (ref 80.0–100.0)
Monocytes Absolute: 0.5 10*3/uL (ref 0.1–1.0)
Monocytes Relative: 9 %
Neutro Abs: 3.8 10*3/uL (ref 1.7–7.7)
Neutrophils Relative %: 66 %
Platelet Count: 403 10*3/uL — ABNORMAL HIGH (ref 150–400)
RBC: 2.67 MIL/uL — ABNORMAL LOW (ref 3.87–5.11)
RDW: 18.8 % — ABNORMAL HIGH (ref 11.5–15.5)
WBC Count: 5.7 10*3/uL (ref 4.0–10.5)
nRBC: 0 % (ref 0.0–0.2)

## 2023-01-03 LAB — MAGNESIUM: Magnesium: 1.3 mg/dL — ABNORMAL LOW (ref 1.7–2.4)

## 2023-01-03 MED ORDER — DEXAMETHASONE 2 MG PO TABS: ORAL_TABLET | ORAL | 0 refills | Status: DC
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
## 2023-01-03 NOTE — Progress Notes (Signed)
Patient seen by Ned Card NP today  Vitals are within treatment parameters.  Labs reviewed by Ned Card NP and are not all within treatment parameters. K+ is 3.2 and magnesium is 1.3. Patient will be received magnesium 4g IVPB.  She will increase potassium to 10 meq 3 times daily. The order for the magnesium is in.  Per physician team, patient is ready for treatment. Please note that modifications are being made to the treatment plan including doses adjusted due to the extremely poor  tolerance of prior chemotherapy.

## 2023-01-04 ENCOUNTER — Telehealth: Payer: Self-pay | Admitting: Primary Care

## 2023-01-04 ENCOUNTER — Inpatient Hospital Stay: Payer: No Typology Code available for payment source

## 2023-01-04 VITALS — BP 95/73 | HR 90 | Temp 98.1°F | Resp 18

## 2023-01-04 DIAGNOSIS — Z5111 Encounter for antineoplastic chemotherapy: Secondary | ICD-10-CM | POA: Diagnosis not present

## 2023-01-04 DIAGNOSIS — C3490 Malignant neoplasm of unspecified part of unspecified bronchus or lung: Secondary | ICD-10-CM

## 2023-01-04 DIAGNOSIS — C342 Malignant neoplasm of middle lobe, bronchus or lung: Secondary | ICD-10-CM

## 2023-01-04 MED ORDER — SODIUM CHLORIDE 0.9 % IV SOLN: Freq: Once | INTRAVENOUS | Status: AC

## 2023-01-04 MED ORDER — MAGNESIUM SULFATE 4 GM/100ML IV SOLN
4.0000 g | Freq: Once | INTRAVENOUS | Status: AC
  Administered 2023-01-04: 4 g via INTRAVENOUS
  Filled 2023-01-04: qty 100

## 2023-01-04 MED ORDER — SODIUM CHLORIDE 0.9 % IV SOLN
1200.0000 mg | Freq: Once | INTRAVENOUS | Status: AC
  Administered 2023-01-04: 1200 mg via INTRAVENOUS
  Filled 2023-01-04: qty 20

## 2023-01-04 MED ORDER — FAMOTIDINE IN NACL 20-0.9 MG/50ML-% IV SOLN
20.0000 mg | Freq: Once | INTRAVENOUS | Status: AC
  Administered 2023-01-04: 20 mg via INTRAVENOUS
  Filled 2023-01-04: qty 50

## 2023-01-04 MED ORDER — SODIUM CHLORIDE 0.9 % IV SOLN
156.2000 mg | Freq: Once | INTRAVENOUS | Status: AC
  Administered 2023-01-04: 160 mg via INTRAVENOUS
  Filled 2023-01-04: qty 16

## 2023-01-04 MED ORDER — SODIUM CHLORIDE 0.9 % IV SOLN
100.0000 mg/m2 | Freq: Once | INTRAVENOUS | Status: AC
  Administered 2023-01-04: 210 mg via INTRAVENOUS
  Filled 2023-01-04: qty 35

## 2023-01-04 MED ORDER — DIPHENHYDRAMINE HCL 50 MG/ML IJ SOLN
50.0000 mg | Freq: Once | INTRAMUSCULAR | Status: AC
  Administered 2023-01-04: 50 mg via INTRAVENOUS
  Filled 2023-01-04: qty 1

## 2023-01-04 MED ORDER — SODIUM CHLORIDE 0.9 % IV SOLN
10.0000 mg | Freq: Once | INTRAVENOUS | Status: AC
  Administered 2023-01-04: 10 mg via INTRAVENOUS
  Filled 2023-01-04: qty 1

## 2023-01-04 MED ORDER — PALONOSETRON HCL INJECTION 0.25 MG/5ML
0.2500 mg | Freq: Once | INTRAVENOUS | Status: AC
  Administered 2023-01-04: 0.25 mg via INTRAVENOUS
  Filled 2023-01-04: qty 5

## 2023-01-04 MED ORDER — SODIUM CHLORIDE 0.9 % IV SOLN
150.0000 mg | Freq: Once | INTRAVENOUS | Status: AC
  Administered 2023-01-04: 150 mg via INTRAVENOUS
  Filled 2023-01-04: qty 5

## 2023-01-04 MED ORDER — HEPARIN SOD (PORK) LOCK FLUSH 100 UNIT/ML IV SOLN
500.0000 [IU] | Freq: Once | INTRAVENOUS | Status: AC | PRN
  Administered 2023-01-04: 500 [IU]

## 2023-01-04 MED ORDER — SODIUM CHLORIDE 0.9% FLUSH
10.0000 mL | INTRAVENOUS | Status: DC | PRN
  Administered 2023-01-04: 10 mL

## 2023-01-04 NOTE — Telephone Encounter (Signed)
Opal Sidles sister states patient needs supplies for pleurx catheter. Only one bottle left. Opal Sidles phone number is [phone].

## 2023-01-04 NOTE — Telephone Encounter (Signed)
Spoke with pt 's sister Opal Sidles (per [facility]) who is requesting Pleurx drainage supplies. Opal Sidles was given Engineer, materials. Nothing further needed at this time.

## 2023-01-04 NOTE — Patient Instructions (Addendum)
[place]   Discharge Instructions: Thank you for choosing [facility] to provide your oncology and hematology care.   If you have a lab appointment with the [facility], please go directly to the [facility] and check in at the registration area.   Wear comfortable clothing and clothing appropriate for easy access to any Portacath or PICC line.   We strive to give you quality time with your provider. You may need to reschedule your appointment if you arrive late (15 or more minutes).  Arriving late affects you and other patients whose appointments are after yours.  Also, if you miss three or more appointments without notifying the office, you may be dismissed from the clinic at the provider's discretion.      For prescription refill requests, have your pharmacy contact our office and allow 72 hours for refills to be completed.    Today you received the following chemotherapy and/or immunotherapy agents Atezolizumab (TECENTRIQ), Paclitaxel (TAXOL) & Carboplatin (PARAPLATIN).      To help prevent nausea and vomiting after your treatment, we encourage you to take your nausea medication as directed.  BELOW ARE SYMPTOMS THAT SHOULD BE REPORTED IMMEDIATELY: *FEVER GREATER THAN 100.4 F (38 C) OR HIGHER *CHILLS OR SWEATING *NAUSEA AND VOMITING THAT IS NOT CONTROLLED WITH YOUR NAUSEA MEDICATION *UNUSUAL SHORTNESS OF BREATH *UNUSUAL BRUISING OR BLEEDING *URINARY PROBLEMS (pain or burning when urinating, or frequent urination) *BOWEL PROBLEMS (unusual diarrhea, constipation, pain near the anus) TENDERNESS IN MOUTH AND THROAT WITH OR WITHOUT PRESENCE OF ULCERS (sore throat, sores in mouth, or a toothache) UNUSUAL RASH, SWELLING OR PAIN  UNUSUAL VAGINAL DISCHARGE OR ITCHING   Items with * indicate a potential emergency and should be followed up as soon as possible or go to the Emergency Department if any problems should occur.  Please show  the CHEMOTHERAPY ALERT CARD or IMMUNOTHERAPY ALERT CARD at check-in to the Emergency Department and triage nurse.  Should you have questions after your visit or need to cancel or reschedule your appointment, please contact [facility]  Dept: [phone]  and follow the prompts.  Office hours are 8:00 a.m. to 4:30 p.m. Monday - Friday. Please note that voicemails left after 4:00 p.m. may not be returned until the following business day.  We are closed weekends and major holidays. You have access to a nurse at all times for urgent questions. Please call the main number to the clinic Dept: [phone] and follow the prompts.   For any non-urgent questions, you may also contact your provider using MyChart. We now offer e-Visits for anyone 57 and older to request care online for non-urgent symptoms. For details visit [email or web address].   Also download the MyChart app! Go to the app store, search "MyChart", open the app, select [facility], and log in with your MyChart username and password.  Atezolizumab Injection What is this medication? ATEZOLIZUMAB (a te zoe LIZ ue mab) treats some types of cancer. It works by helping your immune system slow or stop the spread of cancer cells. It is a monoclonal antibody. This medicine may be used for other purposes; ask your health care provider or pharmacist if you have questions. COMMON BRAND NAME(S): Tecentriq What should I tell my care team before I take this medication? They need to know if you have any of these conditions: Allogeneic stem cell transplant (uses someone else's stem cells) Autoimmune diseases, such as Crohn disease, ulcerative colitis,  lupus History of chest radiation Nervous system problems, such as Guillain-Barre syndrome, myasthenia gravis Organ transplant An unusual or allergic reaction to atezolizumab, other medications, foods, dyes, or preservatives Pregnant or trying to get  pregnant Breast-feeding How should I use this medication? This medication is injected into a vein. It is given by your care team in a hospital or clinic setting. A special MedGuide will be given to you before each treatment. Be sure to read this information carefully each time. Talk to your care team about the use of this medication in children. While it may be prescribed for children as young as 2 years for selected conditions, precautions do apply. Overdosage: If you think you have taken too much of this medicine contact a poison control center or emergency room at once. NOTE: This medicine is only for you. Do not share this medicine with others. What if I miss a dose? Keep appointments for follow-up doses. It is important not to miss your dose. Call your care team if you are unable to keep an appointment. What may interact with this medication? Interactions have not been studied. This list may not describe all possible interactions. Give your health care provider a list of all the medicines, herbs, non-prescription drugs, or dietary supplements you use. Also tell them if you smoke, drink alcohol, or use illegal drugs. Some items may interact with your medicine. What should I watch for while using this medication? Your condition will be monitored carefully while you are receiving this medication. You may need blood work while taking this medication. This medication may cause serious skin reactions. They can happen weeks to months after starting the medication. Contact your care team right away if you notice fevers or flu-like symptoms with a rash. The rash may be red or purple and then turn into blisters or peeling of the skin. You may also notice a red rash with swelling of the face, lips, or lymph nodes in your neck or under your arms. Tell your care team right away if you have any change in your eyesight. Talk to your care team if you may be pregnant. Serious birth defects can occur if you take  this medication during pregnancy and for 5 months after the last dose. You will need a negative pregnancy test before starting this medication. Contraception is recommended while taking this medication and for 5 months after the last dose. Your care team can help you find the option that works for you. Do not breastfeed while taking this medication and for at least 5 months after the last dose. What side effects may I notice from receiving this medication? Side effects that you should report to your doctor or health care professional as soon as possible: Allergic reactions--skin rash, itching, hives, swelling of the face, lips, tongue, or throat Dry cough, shortness of breath or trouble breathing Eye pain, redness, irritation, or discharge with blurry or decreased vision Heart muscle inflammation--unusual weakness or fatigue, shortness of breath, chest pain, fast or irregular heartbeat, dizziness, swelling of the ankles, feet, or hands Hormone gland problems--headache, sensitivity to light, unusual weakness or fatigue, dizziness, fast or irregular heartbeat, increased sensitivity to cold or heat, excessive sweating, constipation, hair loss, increased thirst or amount of urine, tremors or shaking, irritability Infusion reactions--chest pain, shortness of breath or trouble breathing, feeling faint or lightheaded Kidney injury (glomerulonephritis)--decrease in the amount of urine, red or dark brown urine, foamy or bubbly urine, swelling of the ankles, hands, or feet Liver injury--right upper  belly pain, loss of appetite, nausea, light-colored stool, dark yellow or brown urine, yellowing skin or eyes, unusual weakness or fatigue Pain, tingling, or numbness in the hands or feet, muscle weakness, change in vision, confusion or trouble speaking, loss of balance or coordination, trouble walking, seizures Rash, fever, and swollen lymph nodes Redness, blistering, peeling, or loosening of the skin, including  inside the mouth Sudden or severe stomach pain, bloody diarrhea, fever, nausea, vomiting Side effects that usually do not require medical attention (report to your doctor or health care professional if they continue or are bothersome): Bone, joint, or muscle pain Diarrhea Fatigue Loss of appetite Nausea Skin rash This list may not describe all possible side effects. Call your doctor for medical advice about side effects. You may report side effects to FDA at 1-800-FDA-1088. Where should I keep my medication? This medication is given in a hospital or clinic. It will not be stored at home. NOTE: This sheet is a summary. It may not cover all possible information. If you have questions about this medicine, talk to your doctor, pharmacist, or health care provider.  2023 Elsevier/Gold Standard (2022-01-30 00:00:00)  Paclitaxel Injection What is this medication? PACLITAXEL (PAK li TAX el) treats some types of cancer. It works by slowing down the growth of cancer cells. This medicine may be used for other purposes; ask your health care provider or pharmacist if you have questions. COMMON BRAND NAME(S): Onxol, Taxol What should I tell my care team before I take this medication? They need to know if you have any of these conditions: Heart disease Liver disease Low white blood cell levels An unusual or allergic reaction to paclitaxel, other medications, foods, dyes, or preservatives If you or your partner are pregnant or trying to get pregnant Breast-feeding How should I use this medication? This medication is injected into a vein. It is given by your care team in a hospital or clinic setting. Talk to your care team about the use of this medication in children. While it may be given to children for selected conditions, precautions do apply. Overdosage: If you think you have taken too much of this medicine contact a poison control center or emergency room at once. NOTE: This medicine is only for  you. Do not share this medicine with others. What if I miss a dose? Keep appointments for follow-up doses. It is important not to miss your dose. Call your care team if you are unable to keep an appointment. What may interact with this medication? Do not take this medication with any of the following: Live virus vaccines Other medications may affect the way this medication works. Talk with your care team about all of the medications you take. They may suggest changes to your treatment plan to lower the risk of side effects and to make sure your medications work as intended. This list may not describe all possible interactions. Give your health care provider a list of all the medicines, herbs, non-prescription drugs, or dietary supplements you use. Also tell them if you smoke, drink alcohol, or use illegal drugs. Some items may interact with your medicine. What should I watch for while using this medication? Your condition will be monitored carefully while you are receiving this medication. You may need blood work while taking this medication. This medication may make you feel generally unwell. This is not uncommon as chemotherapy can affect healthy cells as well as cancer cells. Report any side effects. Continue your course of treatment even though you  feel ill unless your care team tells you to stop. This medication can cause serious allergic reactions. To reduce the risk, your care team may give you other medications to take before receiving this one. Be sure to follow the directions from your care team. This medication may increase your risk of getting an infection. Call your care team for advice if you get a fever, chills, sore throat, or other symptoms of a cold or flu. Do not treat yourself. Try to avoid being around people who are sick. This medication may increase your risk to bruise or bleed. Call your care team if you notice any unusual bleeding. Be careful brushing or flossing your teeth or  using a toothpick because you may get an infection or bleed more easily. If you have any dental work done, tell your dentist you are receiving this medication. Talk to your care team if you may be pregnant. Serious birth defects can occur if you take this medication during pregnancy. Talk to your care team before breastfeeding. Changes to your treatment plan may be needed. What side effects may I notice from receiving this medication? Side effects that you should report to your care team as soon as possible: Allergic reactions--skin rash, itching, hives, swelling of the face, lips, tongue, or throat Heart rhythm changes--fast or irregular heartbeat, dizziness, feeling faint or lightheaded, chest pain, trouble breathing Increase in blood pressure Infection--fever, chills, cough, sore throat, wounds that don't heal, pain or trouble when passing urine, general feeling of discomfort or being unwell Low blood pressure--dizziness, feeling faint or lightheaded, blurry vision Low red blood cell level--unusual weakness or fatigue, dizziness, headache, trouble breathing Painful swelling, warmth, or redness of the skin, blisters or sores at the infusion site Pain, tingling, or numbness in the hands or feet Slow heartbeat--dizziness, feeling faint or lightheaded, confusion, trouble breathing, unusual weakness or fatigue Unusual bruising or bleeding Side effects that usually do not require medical attention (report to your care team if they continue or are bothersome): Diarrhea Hair loss Joint pain Loss of appetite Muscle pain Nausea Vomiting This list may not describe all possible side effects. Call your doctor for medical advice about side effects. You may report side effects to FDA at 1-800-FDA-1088. Where should I keep my medication? This medication is given in a hospital or clinic. It will not be stored at home. NOTE: This sheet is a summary. It may not cover all possible information. If you have  questions about this medicine, talk to your doctor, pharmacist, or health care provider.  2023 Elsevier/Gold Standard (2022-02-23 00:00:00)  Carboplatin Injection What is this medication? CARBOPLATIN (KAR boe pla tin) treats some types of cancer. It works by slowing down the growth of cancer cells. This medicine may be used for other purposes; ask your health care provider or pharmacist if you have questions. COMMON BRAND NAME(S): Paraplatin What should I tell my care team before I take this medication? They need to know if you have any of these conditions: Blood disorders Hearing problems Kidney disease Recent or ongoing radiation therapy An unusual or allergic reaction to carboplatin, cisplatin, other medications, foods, dyes, or preservatives Pregnant or trying to get pregnant Breast-feeding How should I use this medication? This medication is injected into a vein. It is given by your care team in a hospital or clinic setting. Talk to your care team about the use of this medication in children. Special care may be needed. Overdosage: If you think you have taken too much of  this medicine contact a poison control center or emergency room at once. NOTE: This medicine is only for you. Do not share this medicine with others. What if I miss a dose? Keep appointments for follow-up doses. It is important not to miss your dose. Call your care team if you are unable to keep an appointment. What may interact with this medication? Medications for seizures Some antibiotics, such as amikacin, gentamicin, neomycin, streptomycin, tobramycin Vaccines This list may not describe all possible interactions. Give your health care provider a list of all the medicines, herbs, non-prescription drugs, or dietary supplements you use. Also tell them if you smoke, drink alcohol, or use illegal drugs. Some items may interact with your medicine. What should I watch for while using this medication? Your condition  will be monitored carefully while you are receiving this medication. You may need blood work while taking this medication. This medication may make you feel generally unwell. This is not uncommon, as chemotherapy can affect healthy cells as well as cancer cells. Report any side effects. Continue your course of treatment even though you feel ill unless your care team tells you to stop. In some cases, you may be given additional medications to help with side effects. Follow all directions for their use. This medication may increase your risk of getting an infection. Call your care team for advice if you get a fever, chills, sore throat, or other symptoms of a cold or flu. Do not treat yourself. Try to avoid being around people who are sick. Avoid taking medications that contain aspirin, acetaminophen, ibuprofen, naproxen, or ketoprofen unless instructed by your care team. These medications may hide a fever. Be careful brushing or flossing your teeth or using a toothpick because you may get an infection or bleed more easily. If you have any dental work done, tell your dentist you are receiving this medication. Talk to your care team if you wish to become pregnant or think you might be pregnant. This medication can cause serious birth defects. Talk to your care team about effective forms of contraception. Do not breast-feed while taking this medication. What side effects may I notice from receiving this medication? Side effects that you should report to your care team as soon as possible: Allergic reactions--skin rash, itching, hives, swelling of the face, lips, tongue, or throat Infection--fever, chills, cough, sore throat, wounds that don't heal, pain or trouble when passing urine, general feeling of discomfort or being unwell Low red blood cell level--unusual weakness or fatigue, dizziness, headache, trouble breathing Pain, tingling, or numbness in the hands or feet, muscle weakness, change in vision,  confusion or trouble speaking, loss of balance or coordination, trouble walking, seizures Unusual bruising or bleeding Side effects that usually do not require medical attention (report to your care team if they continue or are bothersome): Hair loss Nausea Unusual weakness or fatigue Vomiting This list may not describe all possible side effects. Call your doctor for medical advice about side effects. You may report side effects to FDA at 1-800-FDA-1088. Where should I keep my medication? This medication is given in a hospital or clinic. It will not be stored at home. NOTE: This sheet is a summary. It may not cover all possible information. If you have questions about this medicine, talk to your doctor, pharmacist, or health care provider.  2023 Elsevier/Gold Standard (2007-11-30 00:00:00)  Magnesium Sulfate Injection What is this medication? MAGNESIUM SULFATE (mag NEE zee um SUL fate) prevents and treats low levels of magnesium  in your body. It may also be used to prevent and treat seizures during pregnancy in people with high blood pressure disorders, such as preeclampsia or eclampsia. Magnesium plays an important role in maintaining the health of your muscles and nervous system. This medicine may be used for other purposes; ask your health care provider or pharmacist if you have questions. What should I tell my care team before I take this medication? They need to know if you have any of these conditions: Heart disease History of irregular heart beat Kidney disease An unusual or allergic reaction to magnesium sulfate, medications, foods, dyes, or preservatives Pregnant or trying to get pregnant Breast-feeding How should I use this medication? This medication is for infusion into a vein. It is given in a hospital or clinic setting. Talk to your care team about the use of this medication in children. While this medication may be prescribed for selected conditions, precautions do  apply. Overdosage: If you think you have taken too much of this medicine contact a poison control center or emergency room at once. NOTE: This medicine is only for you. Do not share this medicine with others. What if I miss a dose? This does not apply. What may interact with this medication? Certain medications for anxiety or sleep Certain medications for seizures, such phenobarbital Digoxin Medications that relax muscles for surgery Narcotic medications for pain This list may not describe all possible interactions. Give your health care provider a list of all the medicines, herbs, non-prescription drugs, or dietary supplements you use. Also tell them if you smoke, drink alcohol, or use illegal drugs. Some items may interact with your medicine. What should I watch for while using this medication? Your condition will be monitored carefully while you are receiving this medication. You may need blood work done while you are receiving this medication. What side effects may I notice from receiving this medication? Side effects that you should report to your care team as soon as possible: Allergic reactions--skin rash, itching, hives, swelling of the face, lips, tongue, or throat High magnesium level--confusion, drowsiness, facial flushing, redness, sweating, muscle weakness, fast or irregular heartbeat, trouble breathing Low blood pressure--dizziness, feeling faint or lightheaded, blurry vision Side effects that usually do not require medical attention (report to your care team if they continue or are bothersome): Headache Nausea This list may not describe all possible side effects. Call your doctor for medical advice about side effects. You may report side effects to FDA at 1-800-FDA-1088. Where should I keep my medication? This medication is given in a hospital or clinic and will not be stored at home. NOTE: This sheet is a summary. It may not cover all possible information. If you have  questions about this medicine, talk to your doctor, pharmacist, or health care provider.  2023 Elsevier/Gold Standard (2013-02-14 00:00:00)

## 2023-01-05 ENCOUNTER — Telehealth: Payer: Self-pay

## 2023-01-05 ENCOUNTER — Inpatient Hospital Stay: Payer: No Typology Code available for payment source

## 2023-01-05 ENCOUNTER — Other Ambulatory Visit: Payer: Self-pay | Admitting: Nurse Practitioner

## 2023-01-05 ENCOUNTER — Other Ambulatory Visit: Payer: Self-pay

## 2023-01-05 ENCOUNTER — Ambulatory Visit: Payer: No Typology Code available for payment source | Admitting: Pulmonary Disease

## 2023-01-05 VITALS — BP 102/60 | HR 87 | Temp 98.2°F | Resp 18

## 2023-01-05 DIAGNOSIS — C3490 Malignant neoplasm of unspecified part of unspecified bronchus or lung: Secondary | ICD-10-CM

## 2023-01-05 DIAGNOSIS — C342 Malignant neoplasm of middle lobe, bronchus or lung: Secondary | ICD-10-CM

## 2023-01-05 DIAGNOSIS — Z5111 Encounter for antineoplastic chemotherapy: Secondary | ICD-10-CM | POA: Diagnosis not present

## 2023-01-05 MED ORDER — PEGFILGRASTIM-JMDB 6 MG/0.6ML ~~LOC~~ SOSY
6.0000 mg | PREFILLED_SYRINGE | Freq: Once | SUBCUTANEOUS | Status: AC
  Administered 2023-01-05: 6 mg via SUBCUTANEOUS
  Filled 2023-01-05: qty 0.6

## 2023-01-05 NOTE — Patient Instructions (Signed)

## 2023-01-05 NOTE — Telephone Encounter (Signed)
I called Crystal [name] to discuss her medication. Per Dr. Benay Spice do not increase the magnesium. K+ 3x daily. Patient gave verbal understanding and had no further questions or concerns.

## 2023-01-05 NOTE — Progress Notes (Signed)
24 Hour Callback 24 Hour callback post 1st time Tecentriq and Taxol infusion. Patient seen in infusion today for injection reports having no side effects from 1st time treatment. Pt denies any N/V/D. Pt did have questions regarding changes in her prescription medications from her last doctor visit.  This RN spoke with Jerene Canny, LPN who will get in touch with the patient. Pt had no other needs or questions.

## 2023-01-10 ENCOUNTER — Inpatient Hospital Stay: Payer: No Typology Code available for payment source

## 2023-01-10 ENCOUNTER — Inpatient Hospital Stay: Payer: No Typology Code available for payment source | Admitting: Nurse Practitioner

## 2023-01-10 ENCOUNTER — Encounter: Payer: Self-pay | Admitting: Nurse Practitioner

## 2023-01-10 VITALS — BP 105/76 | HR 94 | Temp 98.2°F | Resp 18 | Ht 65.0 in | Wt 209.0 lb

## 2023-01-10 DIAGNOSIS — I700 Atherosclerosis of aorta: Secondary | ICD-10-CM | POA: Diagnosis not present

## 2023-01-10 DIAGNOSIS — C3491 Malignant neoplasm of unspecified part of right bronchus or lung: Secondary | ICD-10-CM | POA: Diagnosis not present

## 2023-01-10 DIAGNOSIS — C3490 Malignant neoplasm of unspecified part of unspecified bronchus or lung: Secondary | ICD-10-CM

## 2023-01-10 DIAGNOSIS — C342 Malignant neoplasm of middle lobe, bronchus or lung: Secondary | ICD-10-CM

## 2023-01-10 DIAGNOSIS — R0600 Dyspnea, unspecified: Secondary | ICD-10-CM | POA: Diagnosis not present

## 2023-01-10 DIAGNOSIS — Z95828 Presence of other vascular implants and grafts: Secondary | ICD-10-CM

## 2023-01-10 DIAGNOSIS — J910 Malignant pleural effusion: Secondary | ICD-10-CM | POA: Diagnosis not present

## 2023-01-10 DIAGNOSIS — R0602 Shortness of breath: Secondary | ICD-10-CM | POA: Diagnosis not present

## 2023-01-10 DIAGNOSIS — T80211A Bloodstream infection due to central venous catheter, initial encounter: Secondary | ICD-10-CM | POA: Diagnosis not present

## 2023-01-10 DIAGNOSIS — J90 Pleural effusion, not elsewhere classified: Secondary | ICD-10-CM | POA: Diagnosis not present

## 2023-01-10 DIAGNOSIS — J9811 Atelectasis: Secondary | ICD-10-CM | POA: Diagnosis not present

## 2023-01-10 LAB — MAGNESIUM: Magnesium: 1.2 mg/dL — ABNORMAL LOW (ref 1.7–2.4)

## 2023-01-10 LAB — CMP (CANCER CENTER ONLY)
ALT: 14 U/L (ref 0–44)
AST: 18 U/L (ref 15–41)
Albumin: 2.5 g/dL — ABNORMAL LOW (ref 3.5–5.0)
Alkaline Phosphatase: 89 U/L (ref 38–126)
Anion gap: 7 (ref 5–15)
BUN: 14 mg/dL (ref 8–23)
CO2: 29 mmol/L (ref 22–32)
Calcium: 8.1 mg/dL — ABNORMAL LOW (ref 8.9–10.3)
Chloride: 103 mmol/L (ref 98–111)
Creatinine: 1.25 mg/dL — ABNORMAL HIGH (ref 0.44–1.00)
GFR, Estimated: 44 mL/min — ABNORMAL LOW (ref >60)
Glucose, Bld: 108 mg/dL — ABNORMAL HIGH (ref 70–99)
Potassium: 3.7 mmol/L (ref 3.5–5.1)
Sodium: 139 mmol/L (ref 135–145)
Total Bilirubin: 0.5 mg/dL (ref 0.3–1.2)
Total Protein: 6.2 g/dL — ABNORMAL LOW (ref 6.5–8.1)

## 2023-01-10 LAB — CBC WITH DIFFERENTIAL (CANCER CENTER ONLY)
Abs Immature Granulocytes: 0.1 10*3/uL — ABNORMAL HIGH (ref 0.00–0.07)
Band Neutrophils: 2 %
Basophils Absolute: 0 10*3/uL (ref 0.0–0.1)
Basophils Relative: 0 %
Eosinophils Absolute: 0.1 10*3/uL (ref 0.0–0.5)
Eosinophils Relative: 1 %
HCT: 23.4 % — ABNORMAL LOW (ref 36.0–46.0)
Hemoglobin: 7.4 g/dL — ABNORMAL LOW (ref 12.0–15.0)
Lymphocytes Relative: 18 %
Lymphs Abs: 1.1 10*3/uL (ref 0.7–4.0)
MCH: 30.1 pg (ref 26.0–34.0)
MCHC: 31.6 g/dL (ref 30.0–36.0)
MCV: 95.1 fL (ref 80.0–100.0)
Metamyelocytes Relative: 2 %
Monocytes Absolute: 0.4 10*3/uL (ref 0.1–1.0)
Monocytes Relative: 7 %
Neutro Abs: 4.5 10*3/uL (ref 1.7–7.7)
Neutrophils Relative %: 70 %
Platelet Count: 192 10*3/uL (ref 150–400)
RBC: 2.46 MIL/uL — ABNORMAL LOW (ref 3.87–5.11)
RDW: 18.3 % — ABNORMAL HIGH (ref 11.5–15.5)
WBC Count: 6.2 10*3/uL (ref 4.0–10.5)
nRBC: 0 % (ref 0.0–0.2)

## 2023-01-10 MED ORDER — MAGNESIUM SULFATE 4 GM/100ML IV SOLN
4.0000 g | Freq: Once | INTRAVENOUS | Status: AC
  Administered 2023-01-10: 4 g via INTRAVENOUS
  Filled 2023-01-10: qty 100

## 2023-01-10 MED ORDER — HEPARIN SOD (PORK) LOCK FLUSH 100 UNIT/ML IV SOLN
500.0000 [IU] | Freq: Once | INTRAVENOUS | Status: AC
  Administered 2023-01-10: 500 [IU] via INTRAVENOUS

## 2023-01-10 MED ORDER — SODIUM CHLORIDE 0.9% FLUSH
10.0000 mL | Freq: Once | INTRAVENOUS | Status: AC
  Administered 2023-01-10: 10 mL via INTRAVENOUS

## 2023-01-10 MED ORDER — SODIUM CHLORIDE 0.9 % IV SOLN: INTRAVENOUS | Status: DC

## 2023-01-10 NOTE — Patient Instructions (Signed)
Hypomagnesemia Hypomagnesemia is a condition in which the level of magnesium in the blood is too low. Magnesium is a mineral that is found in many foods. It is used in many different processes in the body. Hypomagnesemia can affect every organ in the body. In severe cases, it can cause life-threatening problems. What are the causes? This condition may be caused by: Not getting enough magnesium in your diet or not having enough healthy foods to eat (malnutrition). Problems with magnesium absorption in the intestines. Dehydration. Excessive use of alcohol. Vomiting. Severe or long-term (chronic) diarrhea. Some medicines, including medicines that make you urinate more often (diuretics). Certain diseases, such as kidney disease, diabetes, celiac disease, and overactive thyroid. What are the signs or symptoms? Symptoms of this condition include: Loss of appetite, nausea, and vomiting. Involuntary shaking or trembling of a body part (tremor). Muscle weakness or tingling in the arms and legs. Sudden tightening of muscles (muscle spasms). Confusion. Psychiatric issues, such as: Depression and irritability. Psychosis. A feeling of fluttering of the heart (palpitations). Seizures. These symptoms are more severe if magnesium levels drop suddenly. How is this diagnosed? This condition may be diagnosed based on: Your symptoms and medical history. A physical exam. Blood and urine tests. How is this treated? Treatment depends on the cause and the severity of the condition. It may be treated by: Taking a magnesium supplement. This can be taken in pill form. If the condition is severe, magnesium is usually given through an IV. Making changes to your diet. You may be directed to eat foods that have a lot of magnesium, such as green leafy vegetables, peas, beans, and nuts. Not drinking alcohol. If you are struggling not to drink, ask your health care provider for help. Follow these instructions at  home: Eating and drinking     Make sure that your diet includes foods with magnesium. Foods that have a lot of magnesium in them include: Green leafy vegetables, such as spinach and broccoli. Beans and peas. Nuts and seeds, such as almonds and sunflower seeds. Whole grains, such as whole grain bread and fortified cereals. Drink fluids that contain salts and minerals (electrolytes), such as sports drinks, when you are active. Do not drink alcohol. General instructions Take over-the-counter and prescription medicines only as told by your health care provider. Take magnesium supplements as directed if your health care provider tells you to take them. Have your magnesium levels monitored as told by your health care provider. Keep all follow-up visits. This is important. Contact a health care provider if: You get worse instead of better. Your symptoms return. Get help right away if: You develop severe muscle weakness. You have trouble breathing. You feel that your heart is racing. These symptoms may represent a serious problem that is an emergency. Do not wait to see if the symptoms will go away. Get medical help right away. Call your local emergency services (911 in the [place] Do not drive yourself to the hospital. Summary Hypomagnesemia is a condition in which the level of magnesium in the blood is too low. Hypomagnesemia can affect every organ in the body. Treatment may include eating more foods that contain magnesium, taking magnesium supplements, and not drinking alcohol. Have your magnesium levels monitored as told by your health care provider. This information is not intended to replace advice given to you by your health care provider. Make sure you discuss any questions you have with your health care provider. Document Revised: 03/08/2021 Document Reviewed: 03/08/2021 Elsevier Patient Education    2023 [employer] Inc[employer]  
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
## 2023-01-10 NOTE — Patient Instructions (Signed)

## 2023-01-10 NOTE — Addendum Note (Signed)
Addended by: Ned Card K on: 01/10/2023 11:30 AM   Modules accepted: Orders

## 2023-01-11 ENCOUNTER — Ambulatory Visit: Payer: No Typology Code available for payment source | Admitting: Pulmonary Disease

## 2023-01-12 ENCOUNTER — Emergency Department (HOSPITAL_COMMUNITY): Payer: No Typology Code available for payment source

## 2023-01-12 ENCOUNTER — Telehealth: Payer: Self-pay | Admitting: Primary Care

## 2023-01-12 ENCOUNTER — Other Ambulatory Visit: Payer: Self-pay

## 2023-01-12 ENCOUNTER — Encounter (HOSPITAL_COMMUNITY): Payer: Self-pay

## 2023-01-12 ENCOUNTER — Inpatient Hospital Stay (HOSPITAL_COMMUNITY)
Admission: EM | Admit: 2023-01-12 | Discharge: 2023-01-19 | DRG: 314 | Disposition: A | Discharge status: Home Health | Payer: No Typology Code available for payment source | Attending: Internal Medicine | Admitting: Internal Medicine

## 2023-01-12 DIAGNOSIS — J9811 Atelectasis: Secondary | ICD-10-CM | POA: Diagnosis not present

## 2023-01-12 DIAGNOSIS — A4101 Sepsis due to Methicillin susceptible Staphylococcus aureus: Secondary | ICD-10-CM | POA: Diagnosis present

## 2023-01-12 DIAGNOSIS — T451X5A Adverse effect of antineoplastic and immunosuppressive drugs, initial encounter: Secondary | ICD-10-CM | POA: Diagnosis present

## 2023-01-12 DIAGNOSIS — Z832 Family history of diseases of the blood and blood-forming organs and certain disorders involving the immune mechanism: Secondary | ICD-10-CM

## 2023-01-12 DIAGNOSIS — I5022 Chronic systolic (congestive) heart failure: Secondary | ICD-10-CM | POA: Diagnosis present

## 2023-01-12 DIAGNOSIS — E7800 Pure hypercholesterolemia, unspecified: Secondary | ICD-10-CM | POA: Diagnosis present

## 2023-01-12 DIAGNOSIS — D638 Anemia in other chronic diseases classified elsewhere: Secondary | ICD-10-CM | POA: Diagnosis present

## 2023-01-12 DIAGNOSIS — I700 Atherosclerosis of aorta: Secondary | ICD-10-CM | POA: Diagnosis not present

## 2023-01-12 DIAGNOSIS — I10 Essential (primary) hypertension: Secondary | ICD-10-CM | POA: Diagnosis not present

## 2023-01-12 DIAGNOSIS — R0600 Dyspnea, unspecified: Secondary | ICD-10-CM | POA: Diagnosis not present

## 2023-01-12 DIAGNOSIS — Z7901 Long term (current) use of anticoagulants: Secondary | ICD-10-CM

## 2023-01-12 DIAGNOSIS — D6959 Other secondary thrombocytopenia: Secondary | ICD-10-CM | POA: Diagnosis present

## 2023-01-12 DIAGNOSIS — I5032 Chronic diastolic (congestive) heart failure: Secondary | ICD-10-CM | POA: Diagnosis present

## 2023-01-12 DIAGNOSIS — C342 Malignant neoplasm of middle lobe, bronchus or lung: Secondary | ICD-10-CM

## 2023-01-12 DIAGNOSIS — C8590 Non-Hodgkin lymphoma, unspecified, unspecified site: Secondary | ICD-10-CM | POA: Diagnosis present

## 2023-01-12 DIAGNOSIS — T80211D Bloodstream infection due to central venous catheter, subsequent encounter: Secondary | ICD-10-CM

## 2023-01-12 DIAGNOSIS — R7881 Bacteremia: Secondary | ICD-10-CM

## 2023-01-12 DIAGNOSIS — E876 Hypokalemia: Secondary | ICD-10-CM | POA: Diagnosis not present

## 2023-01-12 DIAGNOSIS — R918 Other nonspecific abnormal finding of lung field: Secondary | ICD-10-CM | POA: Diagnosis not present

## 2023-01-12 DIAGNOSIS — J910 Malignant pleural effusion: Secondary | ICD-10-CM | POA: Diagnosis present

## 2023-01-12 DIAGNOSIS — Z885 Allergy status to narcotic agent status: Secondary | ICD-10-CM

## 2023-01-12 DIAGNOSIS — Y848 Other medical procedures as the cause of abnormal reaction of the patient, or of later complication, without mention of misadventure at the time of the procedure: Secondary | ICD-10-CM | POA: Diagnosis present

## 2023-01-12 DIAGNOSIS — Z86711 Personal history of pulmonary embolism: Secondary | ICD-10-CM

## 2023-01-12 DIAGNOSIS — I130 Hypertensive heart and chronic kidney disease with heart failure and stage 1 through stage 4 chronic kidney disease, or unspecified chronic kidney disease: Secondary | ICD-10-CM | POA: Diagnosis present

## 2023-01-12 DIAGNOSIS — N183 Chronic kidney disease, stage 3 unspecified: Secondary | ICD-10-CM | POA: Diagnosis present

## 2023-01-12 DIAGNOSIS — Z853 Personal history of malignant neoplasm of breast: Secondary | ICD-10-CM

## 2023-01-12 DIAGNOSIS — Z823 Family history of stroke: Secondary | ICD-10-CM

## 2023-01-12 DIAGNOSIS — Z8719 Personal history of other diseases of the digestive system: Secondary | ICD-10-CM

## 2023-01-12 DIAGNOSIS — Z90710 Acquired absence of both cervix and uterus: Secondary | ICD-10-CM

## 2023-01-12 DIAGNOSIS — E669 Obesity, unspecified: Secondary | ICD-10-CM | POA: Diagnosis present

## 2023-01-12 DIAGNOSIS — Z923 Personal history of irradiation: Secondary | ICD-10-CM

## 2023-01-12 DIAGNOSIS — K219 Gastro-esophageal reflux disease without esophagitis: Secondary | ICD-10-CM | POA: Diagnosis present

## 2023-01-12 DIAGNOSIS — Z888 Allergy status to other drugs, medicaments and biological substances status: Secondary | ICD-10-CM

## 2023-01-12 DIAGNOSIS — B9561 Methicillin susceptible Staphylococcus aureus infection as the cause of diseases classified elsewhere: Secondary | ICD-10-CM

## 2023-01-12 DIAGNOSIS — R0902 Hypoxemia: Secondary | ICD-10-CM | POA: Diagnosis not present

## 2023-01-12 DIAGNOSIS — T80211A Bloodstream infection due to central venous catheter, initial encounter: Principal | ICD-10-CM

## 2023-01-12 DIAGNOSIS — D84821 Immunodeficiency due to drugs: Secondary | ICD-10-CM | POA: Diagnosis present

## 2023-01-12 DIAGNOSIS — R0602 Shortness of breath: Secondary | ICD-10-CM | POA: Diagnosis not present

## 2023-01-12 DIAGNOSIS — Z66 Do not resuscitate: Secondary | ICD-10-CM | POA: Diagnosis present

## 2023-01-12 DIAGNOSIS — D6481 Anemia due to antineoplastic chemotherapy: Secondary | ICD-10-CM | POA: Diagnosis present

## 2023-01-12 DIAGNOSIS — Z8249 Family history of ischemic heart disease and other diseases of the circulatory system: Secondary | ICD-10-CM

## 2023-01-12 DIAGNOSIS — I428 Other cardiomyopathies: Secondary | ICD-10-CM | POA: Diagnosis present

## 2023-01-12 DIAGNOSIS — N1831 Chronic kidney disease, stage 3a: Secondary | ICD-10-CM | POA: Diagnosis present

## 2023-01-12 DIAGNOSIS — Z6834 Body mass index (BMI) 34.0-34.9, adult: Secondary | ICD-10-CM

## 2023-01-12 DIAGNOSIS — C3490 Malignant neoplasm of unspecified part of unspecified bronchus or lung: Secondary | ICD-10-CM | POA: Diagnosis not present

## 2023-01-12 DIAGNOSIS — Z841 Family history of disorders of kidney and ureter: Secondary | ICD-10-CM

## 2023-01-12 DIAGNOSIS — J90 Pleural effusion, not elsewhere classified: Secondary | ICD-10-CM | POA: Diagnosis not present

## 2023-01-12 DIAGNOSIS — Z9049 Acquired absence of other specified parts of digestive tract: Secondary | ICD-10-CM

## 2023-01-12 DIAGNOSIS — E785 Hyperlipidemia, unspecified: Secondary | ICD-10-CM | POA: Diagnosis present

## 2023-01-12 DIAGNOSIS — C3491 Malignant neoplasm of unspecified part of right bronchus or lung: Secondary | ICD-10-CM | POA: Diagnosis present

## 2023-01-12 LAB — COMPREHENSIVE METABOLIC PANEL
ALT: 17 U/L (ref 0–44)
AST: 21 U/L (ref 15–41)
Albumin: 2 g/dL — ABNORMAL LOW (ref 3.5–5.0)
Alkaline Phosphatase: 110 U/L (ref 38–126)
Anion gap: 8 (ref 5–15)
BUN: 11 mg/dL (ref 8–23)
CO2: 27 mmol/L (ref 22–32)
Calcium: 7.7 mg/dL — ABNORMAL LOW (ref 8.9–10.3)
Chloride: 107 mmol/L (ref 98–111)
Creatinine, Ser: 1.39 mg/dL — ABNORMAL HIGH (ref 0.44–1.00)
GFR, Estimated: 39 mL/min — ABNORMAL LOW (ref >60)
Glucose, Bld: 118 mg/dL — ABNORMAL HIGH (ref 70–99)
Potassium: 3.4 mmol/L — ABNORMAL LOW (ref 3.5–5.1)
Sodium: 142 mmol/L (ref 135–145)
Total Bilirubin: 0.5 mg/dL (ref 0.3–1.2)
Total Protein: 6 g/dL — ABNORMAL LOW (ref 6.5–8.1)

## 2023-01-12 LAB — CBC WITH DIFFERENTIAL/PLATELET
Abs Immature Granulocytes: 2.02 10*3/uL — ABNORMAL HIGH (ref 0.00–0.07)
Basophils Absolute: 0.2 10*3/uL — ABNORMAL HIGH (ref 0.0–0.1)
Basophils Relative: 1 %
Eosinophils Absolute: 0 10*3/uL (ref 0.0–0.5)
Eosinophils Relative: 0 %
HCT: 22.7 % — ABNORMAL LOW (ref 36.0–46.0)
Hemoglobin: 7 g/dL — ABNORMAL LOW (ref 12.0–15.0)
Immature Granulocytes: 7 %
Lymphocytes Relative: 8 %
Lymphs Abs: 2.3 10*3/uL (ref 0.7–4.0)
MCH: 30.6 pg (ref 26.0–34.0)
MCHC: 30.8 g/dL (ref 30.0–36.0)
MCV: 99.1 fL (ref 80.0–100.0)
Monocytes Absolute: 2.2 10*3/uL — ABNORMAL HIGH (ref 0.1–1.0)
Monocytes Relative: 7 %
Neutro Abs: 22.6 10*3/uL — ABNORMAL HIGH (ref 1.7–7.7)
Neutrophils Relative %: 77 %
Platelets: 194 10*3/uL (ref 150–400)
RBC: 2.29 MIL/uL — ABNORMAL LOW (ref 3.87–5.11)
RDW: 18.8 % — ABNORMAL HIGH (ref 11.5–15.5)
WBC: 29.3 10*3/uL — ABNORMAL HIGH (ref 4.0–10.5)
nRBC: 1.3 % — ABNORMAL HIGH (ref 0.0–0.2)

## 2023-01-12 LAB — URINALYSIS, ROUTINE W REFLEX MICROSCOPIC
Bilirubin Urine: NEGATIVE
Glucose, UA: NEGATIVE mg/dL
Ketones, ur: NEGATIVE mg/dL
Leukocytes,Ua: NEGATIVE
Nitrite: NEGATIVE
Protein, ur: NEGATIVE mg/dL
Specific Gravity, Urine: 1.021 (ref 1.005–1.030)
pH: 5 (ref 5.0–8.0)

## 2023-01-12 LAB — BRAIN NATRIURETIC PEPTIDE: B Natriuretic Peptide: 315.2 pg/mL — ABNORMAL HIGH (ref 0.0–100.0)

## 2023-01-12 LAB — LACTIC ACID, PLASMA: Lactic Acid, Venous: 1.7 mmol/L (ref 0.5–1.9)

## 2023-01-12 MED ORDER — SODIUM CHLORIDE 0.9 % IV SOLN
500.0000 mg | INTRAVENOUS | Status: DC
  Administered 2023-01-12 – 2023-01-13 (×2): 500 mg via INTRAVENOUS
  Filled 2023-01-12 (×2): qty 5

## 2023-01-12 MED ORDER — SODIUM CHLORIDE 0.9 % IV SOLN
1.0000 g | INTRAVENOUS | Status: DC
  Administered 2023-01-12: 1 g via INTRAVENOUS
  Filled 2023-01-12: qty 10

## 2023-01-12 MED ORDER — LACTATED RINGERS IV SOLN: INTRAVENOUS | Status: DC

## 2023-01-12 MED ORDER — IOHEXOL 300 MG/ML  SOLN
75.0000 mL | Freq: Once | INTRAMUSCULAR | Status: AC | PRN
  Administered 2023-01-12: 75 mL via INTRAVENOUS

## 2023-01-12 NOTE — ED Triage Notes (Signed)
Pt reports sob x 2 days states has a drain in right lung that is clogged up.

## 2023-01-12 NOTE — ED Provider Notes (Signed)
[place] EMERGENCY DEPARTMENT AT [facility] Provider Note   CSN: [id number] Arrival date & time: 01/12/23  1805     History  Chief Complaint  Patient presents with   Shortness of Breath    Crystal [name] is a 77 y.o. female.  77 year old female with history of lung cancer presents with increasing shortness of breath.  Patient has chronic right-sided pleural effusions and her catheter stopped working a couple days ago.  She has had increased dyspnea on exertion.  Patient also has a history of anemia and has required blood transfusion in the past.  Notes that she has had diffuse weakness.  Has had anorexia.  No vomiting or diarrhea.  No fever or chills.       Home Medications Prior to Admission medications   Medication Sig Start Date End Date Taking? Authorizing Provider  acetaminophen (TYLENOL) 325 MG tablet Take 2 tablets (650 mg total) by mouth every 6 (six) hours as needed for mild pain or moderate pain. 09/14/22   Arrien, Jimmy Picket, MD  dexamethasone (DECADRON) 2 MG tablet Take 5 tabs (10mg ) 10 pm night prior to first chemo and 6 am morning of first chemo 01/03/23   Owens Shark, NP  diphenhydrAMINE (BENADRYL) 25 MG tablet Take 50 mg by mouth at bedtime.    [provider]  ELIQUIS 5 MG TABS tablet Take 5 mg by mouth 2 (two) times daily. 12/31/22   [provider]  folic acid (FOLVITE) 1 MG tablet Take 1 tablet (1 mg total) by mouth daily. 11/29/22   Owens Shark, NP  lidocaine-prilocaine (EMLA) cream APPLY 1 APPLICATION AS NEEDED. APPLY TO PORT SITE AN HOUR BEFORE PORT TO BE ACCESSED 01/01/23   Ladell Pier, MD  LORazepam (ATIVAN) 0.5 MG tablet Take 1 tablet (0.5 mg total) by mouth 2 (two) times daily as needed for anxiety. 01/01/23   Ladell Pier, MD  magic mouthwash (nystatin, diphenhydrAMINE, alum & mag hydroxide) suspension mixture Swish 5 mL by mouth for 1 minute then spit; may use up to four times daily. Patient not taking:  Reported on 12/29/2022 12/05/22   Owens Shark, NP  magnesium oxide (MAG-OX) 400 (240 Mg) MG tablet Take 1 tablet (400 mg total) by mouth daily. 12/05/22   Owens Shark, NP  metoprolol tartrate (LOPRESSOR) 100 MG tablet Take 1 tablet (100 mg total) by mouth 2 (two) times daily. 11/17/22 12/20/22  Donne Hazel, MD  nystatin (MYCOSTATIN/NYSTOP) powder Apply 1 Application topically 3 (three) times daily. 12/05/22   Owens Shark, NP  ondansetron (ZOFRAN) 8 MG tablet Take 1 tablet (8 mg total) by mouth every 8 (eight) hours as needed for nausea or vomiting (starting using day 3 after treatment as needed for nausea/vomiting). 11/27/22   Ladell Pier, MD  oxyCODONE (OXY IR/ROXICODONE) 5 MG immediate release tablet Take 1 tablet (5 mg total) by mouth every 6 (six) hours as needed for severe pain. 11/23/22   Ladell Pier, MD  pantoprazole (PROTONIX) 40 MG tablet Take 1 tablet (40 mg total) by mouth 2 (two) times daily before a meal. 12/18/22 02/16/23  Dessa Phi, DO  potassium chloride (KLOR-CON M) 10 MEQ tablet Take 1 tablet (10 mEq total) by mouth 2 (two) times daily. Take #2 tablets twice daily on 2/28, then #2 tablets daily 12/20/22   Ladell Pier, MD  prochlorperazine (COMPAZINE) 10 MG tablet Take 1 tablet (10 mg total) by mouth every 6 (six) hours  as needed for nausea or vomiting. 11/27/22   Ladell Pier, MD  sucralfate (CARAFATE) 1 g tablet Take 1 tablet (1 g total) by mouth 4 (four) times daily -  with meals and at bedtime. 12/18/22 02/16/23  Dessa Phi, DO      Allergies    Simvastatin, Zoloft [sertraline], Codeine, Coreg, Lisinopril, and Tizanidine hcl    Review of Systems   Review of Systems  All other systems reviewed and are negative.   Physical Exam Updated Vital Signs BP 126/81   Pulse (!) 133   Temp 98.1 F (36.7 C) (Oral)   Resp 19   Ht 1.651 m (5\' 5" )   Wt 94 kg   SpO2 94%   BMI 34.49 kg/m  Physical Exam Vitals and nursing note reviewed.  Constitutional:       General: She is not in acute distress.    Appearance: Normal appearance. She is well-developed. She is not toxic-appearing.  HENT:     Head: Normocephalic and atraumatic.  Eyes:     General: Lids are normal.     Conjunctiva/sclera: Conjunctivae normal.     Pupils: Pupils are equal, round, and reactive to light.  Neck:     Thyroid: No thyroid mass.     Trachea: No tracheal deviation.  Cardiovascular:     Rate and Rhythm: Normal rate and regular rhythm.     Heart sounds: Normal heart sounds. No murmur heard.    No gallop.  Pulmonary:     Effort: Pulmonary effort is normal. Prolonged expiration present. No respiratory distress.     Breath sounds: Decreased air movement present. No stridor. Examination of the right-upper field reveals decreased breath sounds. Decreased breath sounds present. No wheezing, rhonchi or rales.  Abdominal:     General: There is no distension.     Palpations: Abdomen is soft.     Tenderness: There is no abdominal tenderness. There is no rebound.  Musculoskeletal:        General: No tenderness. Normal range of motion.     Cervical back: Normal range of motion and neck supple.  Skin:    General: Skin is warm and dry.     Findings: No abrasion or rash.  Neurological:     Mental Status: She is alert and oriented to person, place, and time. Mental status is at baseline.     GCS: GCS eye subscore is 4. GCS verbal subscore is 5. GCS motor subscore is 6.     Cranial Nerves: No cranial nerve deficit.     Sensory: No sensory deficit.     Motor: Motor function is intact.  Psychiatric:        Attention and Perception: Attention normal.        Speech: Speech normal.        Behavior: Behavior normal.     ED Results / Procedures / Treatments   Labs (all labs ordered are listed, but only abnormal results are displayed) Labs Reviewed  CBC WITH DIFFERENTIAL/PLATELET  COMPREHENSIVE METABOLIC PANEL  BRAIN NATRIURETIC PEPTIDE  URINALYSIS, ROUTINE W REFLEX  MICROSCOPIC  TYPE AND SCREEN    EKG EKG Interpretation  Date/Time:  Friday January 12 2023 18:13:54 EDT Ventricular Rate:  134 PR Interval:  96 QRS Duration: 93 QT Interval:  298 QTC Calculation: 445 R Axis:   -34 Text Interpretation: Sinus tachycardia Left axis deviation Low voltage, precordial leads Abnormal R-wave progression, early transition Consider anterior infarct Baseline wander in lead(s) V5 V6 Confirmed by  Lacretia Leigh [id number]) on 01/12/2023 6:28:21 PM  Radiology No results found.  Procedures Procedures    Medications Ordered in ED Medications  lactated ringers infusion (has no administration in time range)    ED Course/ Medical Decision Making/ A&P                             Medical Decision Making Amount and/or Complexity of Data Reviewed Labs: ordered. Radiology: ordered. ECG/medicine tests: ordered.  Risk Prescription drug management.   Patient is EKG per interpretation shows sinus tachycardia rate 133.  Patient's chest x-ray per my interpretation shows airspace disease on the right side.  Subsequent CT of the chest per interpretation shows worsening fluid accumulation.  Discussed with the relative at bedside who is a nurse who states that they have been trying to keep her drainage catheter clean.  She has noted some purulent drainage coming out of this.  Concern for possible infection here.  Patient has a significant leukocytosis on her CBC.  Blood cultures and lactate ordered.  Started on IV antibiotics.  Will require admission.  Will consult hospitalist team        Final Clinical Impression(s) / ED Diagnoses Final diagnoses:  None    Rx / DC Orders ED Discharge Orders     None         Lacretia Leigh, MD 01/12/23 2137
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
## 2023-01-12 NOTE — Telephone Encounter (Signed)
April is not listed on any DPRs for the patient.   Called patient directly and she did not answer. Left message for her to call back.

## 2023-01-12 NOTE — Telephone Encounter (Signed)
[facility] niece states patient's pleurx cath is not draining. Ok to speak to April. April phone number is [phone].

## 2023-01-13 DIAGNOSIS — I081 Rheumatic disorders of both mitral and tricuspid valves: Secondary | ICD-10-CM | POA: Diagnosis not present

## 2023-01-13 DIAGNOSIS — C8591 Non-Hodgkin lymphoma, unspecified, lymph nodes of head, face, and neck: Secondary | ICD-10-CM

## 2023-01-13 DIAGNOSIS — K769 Liver disease, unspecified: Secondary | ICD-10-CM | POA: Diagnosis not present

## 2023-01-13 DIAGNOSIS — D84821 Immunodeficiency due to drugs: Secondary | ICD-10-CM | POA: Diagnosis not present

## 2023-01-13 DIAGNOSIS — J9601 Acute respiratory failure with hypoxia: Secondary | ICD-10-CM | POA: Diagnosis not present

## 2023-01-13 DIAGNOSIS — N1831 Chronic kidney disease, stage 3a: Secondary | ICD-10-CM | POA: Diagnosis not present

## 2023-01-13 DIAGNOSIS — B9562 Methicillin resistant Staphylococcus aureus infection as the cause of diseases classified elsewhere: Secondary | ICD-10-CM | POA: Diagnosis not present

## 2023-01-13 DIAGNOSIS — I10 Essential (primary) hypertension: Secondary | ICD-10-CM | POA: Diagnosis not present

## 2023-01-13 DIAGNOSIS — J910 Malignant pleural effusion: Secondary | ICD-10-CM | POA: Diagnosis not present

## 2023-01-13 DIAGNOSIS — N189 Chronic kidney disease, unspecified: Secondary | ICD-10-CM | POA: Diagnosis not present

## 2023-01-13 DIAGNOSIS — N2889 Other specified disorders of kidney and ureter: Secondary | ICD-10-CM | POA: Diagnosis not present

## 2023-01-13 DIAGNOSIS — R0902 Hypoxemia: Secondary | ICD-10-CM | POA: Diagnosis not present

## 2023-01-13 DIAGNOSIS — C3490 Malignant neoplasm of unspecified part of unspecified bronchus or lung: Secondary | ICD-10-CM | POA: Diagnosis not present

## 2023-01-13 DIAGNOSIS — T80212A Local infection due to central venous catheter, initial encounter: Secondary | ICD-10-CM | POA: Diagnosis not present

## 2023-01-13 DIAGNOSIS — E7800 Pure hypercholesterolemia, unspecified: Secondary | ICD-10-CM | POA: Diagnosis not present

## 2023-01-13 DIAGNOSIS — A4101 Sepsis due to Methicillin susceptible Staphylococcus aureus: Secondary | ICD-10-CM | POA: Diagnosis not present

## 2023-01-13 DIAGNOSIS — D6481 Anemia due to antineoplastic chemotherapy: Secondary | ICD-10-CM | POA: Diagnosis not present

## 2023-01-13 DIAGNOSIS — I428 Other cardiomyopathies: Secondary | ICD-10-CM | POA: Diagnosis not present

## 2023-01-13 DIAGNOSIS — R7881 Bacteremia: Secondary | ICD-10-CM | POA: Diagnosis not present

## 2023-01-13 DIAGNOSIS — C3491 Malignant neoplasm of unspecified part of right bronchus or lung: Secondary | ICD-10-CM | POA: Diagnosis not present

## 2023-01-13 DIAGNOSIS — T451X5A Adverse effect of antineoplastic and immunosuppressive drugs, initial encounter: Secondary | ICD-10-CM | POA: Diagnosis not present

## 2023-01-13 DIAGNOSIS — E785 Hyperlipidemia, unspecified: Secondary | ICD-10-CM

## 2023-01-13 DIAGNOSIS — I130 Hypertensive heart and chronic kidney disease with heart failure and stage 1 through stage 4 chronic kidney disease, or unspecified chronic kidney disease: Secondary | ICD-10-CM | POA: Diagnosis not present

## 2023-01-13 DIAGNOSIS — Z6834 Body mass index (BMI) 34.0-34.9, adult: Secondary | ICD-10-CM | POA: Diagnosis not present

## 2023-01-13 DIAGNOSIS — T80211D Bloodstream infection due to central venous catheter, subsequent encounter: Secondary | ICD-10-CM | POA: Diagnosis not present

## 2023-01-13 DIAGNOSIS — I5022 Chronic systolic (congestive) heart failure: Secondary | ICD-10-CM | POA: Diagnosis not present

## 2023-01-13 DIAGNOSIS — Z452 Encounter for adjustment and management of vascular access device: Secondary | ICD-10-CM | POA: Diagnosis not present

## 2023-01-13 DIAGNOSIS — Z66 Do not resuscitate: Secondary | ICD-10-CM | POA: Diagnosis not present

## 2023-01-13 DIAGNOSIS — T80211A Bloodstream infection due to central venous catheter, initial encounter: Secondary | ICD-10-CM | POA: Diagnosis not present

## 2023-01-13 DIAGNOSIS — C8590 Non-Hodgkin lymphoma, unspecified, unspecified site: Secondary | ICD-10-CM | POA: Diagnosis not present

## 2023-01-13 DIAGNOSIS — D638 Anemia in other chronic diseases classified elsewhere: Secondary | ICD-10-CM | POA: Diagnosis not present

## 2023-01-13 DIAGNOSIS — Z8249 Family history of ischemic heart disease and other diseases of the circulatory system: Secondary | ICD-10-CM | POA: Diagnosis not present

## 2023-01-13 DIAGNOSIS — R918 Other nonspecific abnormal finding of lung field: Secondary | ICD-10-CM | POA: Diagnosis not present

## 2023-01-13 DIAGNOSIS — Y848 Other medical procedures as the cause of abnormal reaction of the patient, or of later complication, without mention of misadventure at the time of the procedure: Secondary | ICD-10-CM | POA: Diagnosis present

## 2023-01-13 DIAGNOSIS — D6959 Other secondary thrombocytopenia: Secondary | ICD-10-CM | POA: Diagnosis not present

## 2023-01-13 DIAGNOSIS — J9811 Atelectasis: Secondary | ICD-10-CM | POA: Diagnosis not present

## 2023-01-13 DIAGNOSIS — N183 Chronic kidney disease, stage 3 unspecified: Secondary | ICD-10-CM | POA: Diagnosis not present

## 2023-01-13 DIAGNOSIS — N289 Disorder of kidney and ureter, unspecified: Secondary | ICD-10-CM | POA: Diagnosis not present

## 2023-01-13 DIAGNOSIS — E669 Obesity, unspecified: Secondary | ICD-10-CM | POA: Diagnosis not present

## 2023-01-13 DIAGNOSIS — Z7901 Long term (current) use of anticoagulants: Secondary | ICD-10-CM | POA: Diagnosis not present

## 2023-01-13 DIAGNOSIS — B9561 Methicillin susceptible Staphylococcus aureus infection as the cause of diseases classified elsewhere: Secondary | ICD-10-CM | POA: Diagnosis not present

## 2023-01-13 DIAGNOSIS — E876 Hypokalemia: Secondary | ICD-10-CM | POA: Diagnosis not present

## 2023-01-13 DIAGNOSIS — F419 Anxiety disorder, unspecified: Secondary | ICD-10-CM | POA: Diagnosis not present

## 2023-01-13 LAB — BLOOD CULTURE ID PANEL (REFLEXED) - BCID2

## 2023-01-13 LAB — CBC WITH DIFFERENTIAL/PLATELET
Abs Immature Granulocytes: 2 10*3/uL — ABNORMAL HIGH (ref 0.00–0.07)
Basophils Absolute: 0.2 10*3/uL — ABNORMAL HIGH (ref 0.0–0.1)
Basophils Relative: 1 %
Eosinophils Absolute: 0 10*3/uL (ref 0.0–0.5)
Eosinophils Relative: 0 %
HCT: 21.3 % — ABNORMAL LOW (ref 36.0–46.0)
Hemoglobin: 6.7 g/dL — CL (ref 12.0–15.0)
Immature Granulocytes: 6 %
Lymphocytes Relative: 5 %
Lymphs Abs: 1.7 10*3/uL (ref 0.7–4.0)
MCH: 30.9 pg (ref 26.0–34.0)
MCHC: 31.5 g/dL (ref 30.0–36.0)
MCV: 98.2 fL (ref 80.0–100.0)
Monocytes Absolute: 2.3 10*3/uL — ABNORMAL HIGH (ref 0.1–1.0)
Monocytes Relative: 7 %
Neutro Abs: 25.5 10*3/uL — ABNORMAL HIGH (ref 1.7–7.7)
Neutrophils Relative %: 81 %
Platelets: 179 10*3/uL (ref 150–400)
RBC: 2.17 MIL/uL — ABNORMAL LOW (ref 3.87–5.11)
RDW: 18.6 % — ABNORMAL HIGH (ref 11.5–15.5)
WBC: 31.7 10*3/uL — ABNORMAL HIGH (ref 4.0–10.5)
nRBC: 0.7 % — ABNORMAL HIGH (ref 0.0–0.2)

## 2023-01-13 LAB — PREPARE RBC (CROSSMATCH)

## 2023-01-13 LAB — BASIC METABOLIC PANEL
Anion gap: 8 (ref 5–15)
BUN: 12 mg/dL (ref 8–23)
CO2: 27 mmol/L (ref 22–32)
Calcium: 7.6 mg/dL — ABNORMAL LOW (ref 8.9–10.3)
Chloride: 106 mmol/L (ref 98–111)
Creatinine, Ser: 1.33 mg/dL — ABNORMAL HIGH (ref 0.44–1.00)
GFR, Estimated: 41 mL/min — ABNORMAL LOW (ref >60)
Glucose, Bld: 121 mg/dL — ABNORMAL HIGH (ref 70–99)
Potassium: 3.7 mmol/L (ref 3.5–5.1)
Sodium: 141 mmol/L (ref 135–145)

## 2023-01-13 LAB — PROCALCITONIN: Procalcitonin: 0.27 ng/mL

## 2023-01-13 MED ORDER — FUROSEMIDE 10 MG/ML IJ SOLN
20.0000 mg | Freq: Once | INTRAMUSCULAR | Status: AC
  Administered 2023-01-13: 20 mg via INTRAVENOUS
  Filled 2023-01-13: qty 2

## 2023-01-13 MED ORDER — LIDOCAINE HCL (PF) 1 % IJ SOLN
5.0000 mL | Freq: Once | INTRAMUSCULAR | Status: AC
  Administered 2023-01-13: 5 mL via INTRADERMAL
  Filled 2023-01-13: qty 5

## 2023-01-13 MED ORDER — SODIUM CHLORIDE 0.9% IV SOLUTION: Freq: Once | INTRAVENOUS | Status: AC

## 2023-01-13 MED ORDER — ACETAMINOPHEN 325 MG PO TABS
650.0000 mg | ORAL_TABLET | Freq: Four times a day (QID) | ORAL | Status: DC | PRN
  Administered 2023-01-14: 650 mg via ORAL
  Filled 2023-01-13: qty 2

## 2023-01-13 MED ORDER — PANTOPRAZOLE SODIUM 40 MG PO TBEC
40.0000 mg | DELAYED_RELEASE_TABLET | Freq: Two times a day (BID) | ORAL | Status: DC
  Administered 2023-01-13 – 2023-01-19 (×12): 40 mg via ORAL
  Filled 2023-01-13 (×13): qty 1

## 2023-01-13 MED ORDER — SODIUM CHLORIDE 0.9% FLUSH: 10.0000 mL | INTRAVENOUS | Status: DC | PRN

## 2023-01-13 MED ORDER — FOLIC ACID 1 MG PO TABS
1.0000 mg | ORAL_TABLET | Freq: Every day | ORAL | Status: DC
  Administered 2023-01-13 – 2023-01-19 (×7): 1 mg via ORAL
  Filled 2023-01-13 (×7): qty 1

## 2023-01-13 MED ORDER — LORAZEPAM 0.5 MG PO TABS: 0.5000 mg | ORAL_TABLET | Freq: Two times a day (BID) | ORAL | Status: DC | PRN

## 2023-01-13 MED ORDER — OXYCODONE HCL 5 MG PO TABS
5.0000 mg | ORAL_TABLET | Freq: Every day | ORAL | Status: DC
  Administered 2023-01-13 – 2023-01-18 (×6): 5 mg via ORAL
  Filled 2023-01-13 (×6): qty 1

## 2023-01-13 MED ORDER — OXYCODONE HCL 5 MG PO TABS
5.0000 mg | ORAL_TABLET | Freq: Three times a day (TID) | ORAL | Status: DC | PRN
  Administered 2023-01-13 – 2023-01-19 (×4): 5 mg via ORAL
  Filled 2023-01-13 (×4): qty 1

## 2023-01-13 MED ORDER — SENNOSIDES-DOCUSATE SODIUM 8.6-50 MG PO TABS: 1.0000 | ORAL_TABLET | Freq: Every evening | ORAL | Status: DC | PRN

## 2023-01-13 MED ORDER — METOPROLOL TARTRATE 50 MG PO TABS
100.0000 mg | ORAL_TABLET | Freq: Two times a day (BID) | ORAL | Status: DC
  Administered 2023-01-13 – 2023-01-19 (×13): 100 mg via ORAL
  Filled 2023-01-13 (×14): qty 2

## 2023-01-13 MED ORDER — CHLORHEXIDINE GLUCONATE CLOTH 2 % EX PADS
6.0000 | MEDICATED_PAD | Freq: Every day | CUTANEOUS | Status: DC
  Administered 2023-01-13 – 2023-01-19 (×7): 6 via TOPICAL

## 2023-01-13 MED ORDER — CEFAZOLIN SODIUM-DEXTROSE 2-4 GM/100ML-% IV SOLN
2.0000 g | Freq: Three times a day (TID) | INTRAVENOUS | Status: DC
  Administered 2023-01-13 – 2023-01-19 (×18): 2 g via INTRAVENOUS
  Filled 2023-01-13 (×18): qty 100

## 2023-01-13 MED ORDER — SODIUM CHLORIDE 0.9 % IV SOLN
2.0000 g | Freq: Two times a day (BID) | INTRAVENOUS | Status: DC
  Administered 2023-01-13 (×2): 2 g via INTRAVENOUS
  Filled 2023-01-13 (×2): qty 12.5

## 2023-01-13 MED ORDER — ONDANSETRON HCL 4 MG PO TABS: 4.0000 mg | ORAL_TABLET | Freq: Four times a day (QID) | ORAL | Status: DC | PRN

## 2023-01-13 MED ORDER — ACETAMINOPHEN 650 MG RE SUPP: 650.0000 mg | Freq: Four times a day (QID) | RECTAL | Status: DC | PRN

## 2023-01-13 MED ORDER — OXYCODONE HCL 5 MG PO TABS: 5.0000 mg | ORAL_TABLET | ORAL | Status: DC

## 2023-01-13 MED ORDER — ONDANSETRON HCL 4 MG/2ML IJ SOLN: 4.0000 mg | Freq: Four times a day (QID) | INTRAMUSCULAR | Status: DC | PRN

## 2023-01-13 NOTE — Progress Notes (Signed)
PHARMACY - PHYSICIAN COMMUNICATION CRITICAL VALUE ALERT - BLOOD CULTURE IDENTIFICATION (BCID)  Crystal [name] is an 77 y.o. female who presented to [facility] on 01/12/2023 with a chief complaint of pleurx not draining   Assessment:2/4 staph aureus, no Resistance  Name of physician (or Provider) Contacted:   A Chavez  Current antibiotics: cefepime  Changes to prescribed antibiotics recommended:  D/c cefepime and start ancef 2gm IV q8h  Results for orders placed or performed during the hospital encounter of 01/12/23  Blood Culture ID Panel (Reflexed) (Collected: 01/12/2023 10:20 PM)  Result Value Ref Range   Enterococcus faecalis NOT DETECTED NOT DETECTED   Enterococcus Faecium NOT DETECTED NOT DETECTED   Listeria monocytogenes NOT DETECTED NOT DETECTED   Staphylococcus species DETECTED (A) NOT DETECTED   Staphylococcus aureus (BCID) DETECTED (A) NOT DETECTED   Staphylococcus epidermidis NOT DETECTED NOT DETECTED   Staphylococcus lugdunensis NOT DETECTED NOT DETECTED   Streptococcus species NOT DETECTED NOT DETECTED   Streptococcus agalactiae NOT DETECTED NOT DETECTED   Streptococcus pneumoniae NOT DETECTED NOT DETECTED   Streptococcus pyogenes NOT DETECTED NOT DETECTED   A.calcoaceticus-baumannii NOT DETECTED NOT DETECTED   Bacteroides fragilis NOT DETECTED NOT DETECTED   Enterobacterales NOT DETECTED NOT DETECTED   Enterobacter cloacae complex NOT DETECTED NOT DETECTED   Escherichia coli NOT DETECTED NOT DETECTED   Klebsiella aerogenes NOT DETECTED NOT DETECTED   Klebsiella oxytoca NOT DETECTED NOT DETECTED   Klebsiella pneumoniae NOT DETECTED NOT DETECTED   Proteus species NOT DETECTED NOT DETECTED   Salmonella species NOT DETECTED NOT DETECTED   Serratia marcescens NOT DETECTED NOT DETECTED   Haemophilus influenzae NOT DETECTED NOT DETECTED   Neisseria meningitidis NOT DETECTED NOT DETECTED   Pseudomonas aeruginosa NOT DETECTED NOT DETECTED   Stenotrophomonas maltophilia  NOT DETECTED NOT DETECTED   Candida albicans NOT DETECTED NOT DETECTED   Candida auris NOT DETECTED NOT DETECTED   Candida glabrata NOT DETECTED NOT DETECTED   Candida krusei NOT DETECTED NOT DETECTED   Candida parapsilosis NOT DETECTED NOT DETECTED   Candida tropicalis NOT DETECTED NOT DETECTED   Cryptococcus neoformans/gattii NOT DETECTED NOT DETECTED   Meth resistant mecA/C and MREJ NOT DETECTED NOT DETECTED    Angela Adam 01/13/2023  10:24 PM

## 2023-01-13 NOTE — Telephone Encounter (Signed)
Patient admitted for pleurx not draining. Effusion seems to have dried up. Pleurx removed while inpatient. Needs f/u with beth NP or Dr. Erin Fulling in the next 1-2 weeks. Please call.
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
## 2023-01-13 NOTE — Progress Notes (Signed)
Pharmacy Antibiotic Note  Crystal [name] is a 77 y.o. female admitted on 01/12/2023 with history of lung cancer presents with increasing shortness of breath. Patient has chronic right-sided pleural effusions and her catheter stopped working a couple days ago. She has had increased dyspnea on exertion. Marland Kitchen  Pharmacy has been consulted to dose cefepime for pna  Plan: Cefepime 2gm IV q12h Follow renal function and clinical course  Height: 5\' 5"  (165.1 cm) Weight: 94 kg (207 lb 3.7 oz) IBW/kg (Calculated) : 57  Temp (24hrs), Avg:98.2 F (36.8 C), Min:98.1 F (36.7 C), Max:98.3 F (36.8 C)  Recent Labs  Lab 01/10/23 0942 01/12/23 1845 01/12/23 2125  WBC 6.2 29.3*  --   CREATININE 1.25* 1.39*  --   LATICACIDVEN  --   --  1.7    Estimated Creatinine Clearance: 38.4 mL/min (A) (by C-G formula based on SCr of 1.39 mg/dL (H)).    Allergies  Allergen Reactions   Simvastatin Other (See Comments)    Leg cramps   Zoloft [Sertraline] Nausea Only   Codeine Other (See Comments)    Bad Headaches   Coreg Other (See Comments)    "Made my legs hurt"   Lisinopril Cough   Tizanidine Hcl Rash and Other (See Comments)    Hypotension, also    Antimicrobials this admission: 3/22 CTX x1 3/22 aztih 3/23 cefepime >>  Dose adjustments this admission:   Microbiology results: 3/22 BCx:   Thank you for allowing pharmacy to be a part of this patient's care.  Dolly Rias RPh 01/13/2023, 12:30 AM

## 2023-01-13 NOTE — Consult Note (Signed)
NAME:  Crystal [name], MRN:  [id number], DOB:  09-25-1946, LOS: 0 ADMISSION DATE:  01/12/2023, CONSULTATION DATE:  01/13/2023 REFERRING MD:  Little Ishikawa, MD, CHIEF COMPLAINT:  pleurx not draining   History of Present Illness:  Crystal [name] is a 77 y.o. woman with past medical history of metastatic NSCLC with MPE s/p Pleurx catheter placement in January 2024. Also has history of acute pulmonary embolism in the setting of malignancy on eliquis. Notes decreasing amounts of drainage. Down to about 100cc every three days.   Pertinent  Medical History  Metastatic NSCLC Acute PE/DVT  Significant Hospital Events: Including procedures, antibiotic start and stop dates in addition to other pertinent events     Interim History / Subjective:    Objective   Blood pressure 132/77, pulse (!) 135, temperature 98.2 F (36.8 C), resp. rate (!) 22, height 5\' 5"  (1.651 m), weight 94 kg, SpO2 94 %.        Intake/Output Summary (Last 24 hours) at 01/13/2023 1117 Last data filed at 01/13/2023 0600 Gross per 24 hour  Intake 1450 ml  Output --  Net 1450 ml   Filed Weights   01/12/23 1811  Weight: 94 kg    Examination: General: chronically ill, no respiratory distress HENT: mmm, on nasal cannula Lungs: diminished bilaterally, no wheezes or crackles, right chest wall indwelling catheter site without erythema or pain Cardiovascular: tachycardic, regular Abdomen: soft, nontender Extremities: no edema Neuro: normal speech no focal asymmetry  Resolved Hospital Problem list     Assessment & Plan:   Metastatic NSCLC with MPE s/p pleurx catheter placement. Acute PE in the setting of malignancy  I reviewed her CT Chest and it seems the catheter has drained the pocket of fluid. I suspect she has developed some rind and hopefully will achieve auto-pleurodesis. I think TPA would be low utility based on CT. I have pulled out her pleurx catheter without issue. Recommend sending her home with  some dressings until the site closes up. Will have her follow up in the clinic to ensure no recurrence of effusion. Ok to resume eliquis at discharge.    Lenice Llamas, MD [facility] [place] 01/13/2023 11:25 AM Pager: see AMION  If no response to pager, please call critical care on call (see AMION) until 7pm After 7:00 pm call Elink     Labs   CBC: Recent Labs  Lab 01/10/23 0942 01/12/23 1845 01/13/23 0625  WBC 6.2 29.3* 31.7*  NEUTROABS 4.5 22.6* 25.5*  HGB 7.4* 7.0* 6.7*  HCT 23.4* 22.7* 21.3*  MCV 95.1 99.1 98.2  PLT 192 194 0000000    Basic Metabolic Panel: Recent Labs  Lab 01/10/23 0942 01/12/23 1845 01/13/23 0625  NA 139 142 141  K 3.7 3.4* 3.7  CL 103 107 106  CO2 29 27 27   GLUCOSE 108* 118* 121*  BUN 14 11 12   CREATININE 1.25* 1.39* 1.33*  CALCIUM 8.1* 7.7* 7.6*  MG 1.2*  --   --    GFR: Estimated Creatinine Clearance: 40.2 mL/min (A) (by C-G formula based on SCr of 1.33 mg/dL (H)). Recent Labs  Lab 01/10/23 0942 01/12/23 1845 01/12/23 2125 01/13/23 0625  PROCALCITON  --   --   --  0.27  WBC 6.2 29.3*  --  31.7*  LATICACIDVEN  --   --  1.7  --     Liver Function Tests: Recent Labs  Lab 01/10/23 0942 01/12/23 1845  AST 18 21  ALT 14 17  ALKPHOS 89 110  BILITOT 0.5 0.5  PROT 6.2* 6.0*  ALBUMIN 2.5* 2.0*   No results for input(s): "LIPASE", "AMYLASE" in the last 168 hours. No results for input(s): "AMMONIA" in the last 168 hours.  ABG No results found for: "PHART", "PCO2ART", "PO2ART", "HCO3", "TCO2", "ACIDBASEDEF", "O2SAT"   Coagulation Profile: No results for input(s): "INR", "PROTIME" in the last 168 hours.  Cardiac Enzymes: No results for input(s): "CKTOTAL", "CKMB", "CKMBINDEX", "TROPONINI" in the last 168 hours.  HbA1C: Hgb A1c MFr Bld  Date/Time Value Ref Range Status  09/04/2022 09:06 AM 5.6 4.8 - 5.6 % Final    Comment:             Prediabetes: 5.7 - 6.4          Diabetes: >6.4           Glycemic control for adults with diabetes: <7.0   08/30/2021 08:43 AM 6.0 (H) 4.8 - 5.6 % Final    Comment:             Prediabetes: 5.7 - 6.4          Diabetes: >6.4          Glycemic control for adults with diabetes: <7.0     CBG: No results for input(s): "GLUCAP" in the last 168 hours.  Review of Systems:   Shortness of breath  Past Medical History:  She,  has a past medical history of Anxiety, Atherosclerosis of aorta ([place]), Breast cancer ([place]) (1998), Dyslipidemia, goal LDL below 130, Essential hypertension, GERD (gastroesophageal reflux disease), Hypercholesterolemia, IBS (irritable bowel syndrome), Lymphoma ([place]), non hodgkins lymphoma (12/2010), Nonischemic cardiomyopathy ([place]) (2008), Obesity, Severe obesity (BMI >= 40) ([place]) (05/21/2013), and Tremor.   Surgical History:   Past Surgical History:  Procedure Laterality Date   ABDOMINAL HYSTERECTOMY     BSO   ANKLE FRACTURE SURGERY Left    BREAST EXCISIONAL BIOPSY Left    BREAST EXCISIONAL BIOPSY Right    BREAST LUMPECTOMY Right 1998   BREAST LUMPECTOMY Right 08/25/2020   Procedure: RIGHT BREAST LUMPECTOMY;  Surgeon: Coralie Keens, MD;  Location: [facility];  Service: General;  Laterality: Right;   CHEST TUBE INSERTION Left 11/16/2022   Procedure: INSERTION PLEURAL DRAINAGE CATHETER;  Surgeon: Freddi Starr, MD;  Location: [facility];  Service: Pulmonary;  Laterality: Left;  afternoon scheduling time please   CHOLECYSTECTOMY N/A 06/09/2020   Procedure: LAPAROSCOPIC CHOLECYSTECTOMY;  Surgeon: Coralie Keens, MD;  Location: [facility];  Service: General;  Laterality: N/A;   COLONOSCOPY     IR BONE TUMOR(S)RF ABLATION  11/06/2022   IR IMAGING GUIDED PORT INSERTION  11/15/2022   IR KYPHO LUMBAR INC FX REDUCE BONE BX UNI/BIL CANNULATION INC/IMAGING  11/06/2022   IR RADIOLOGIST EVAL & MGMT  10/26/2022   LEFT HEART CATH AND CORONARY ANGIOGRAPHY  12/2007   None coronary disease, EF 45% (up from nuclear study  EF of 30% in June '08)   NM MYOVIEW LTD  03/29/2007   EF 33%,NEGATIVE ISCHEMIA, prob need cath   NM MYOVIEW LTD  05/01/2014   Lexiscan: EF 55%. Normal wall motion; no ischemia or infarction; apical thinning   PORTACATH PLACEMENT  08/25/2013   rt. with tip in cavoatrial junction, Dr.Yamagata    TRANSTHORACIC ECHOCARDIOGRAM  05/01/2014   Normal LV size with low normal function. EF of 50-55% and Gr 1 DD; aortic sclerosis without stenosis. MAC and thickening/calcification of the anterior leaflet - no notable AI / AS  or MR/MS     Social History:   reports that she has never smoked. She has never used smokeless tobacco. She reports current alcohol use. She reports that she does not use drugs.   Family History:  Her family history includes Cancer in her paternal grandmother and sister; Chronic Renal Failure in her mother; Heart Problems in her father, maternal grandmother, mother, and paternal grandfather; Heart attack in her sister; Hypertension in her brother, sister, and sister; Lupus in her brother; Migraines in her sister; Stroke in her maternal grandfather.   Allergies Allergies  Allergen Reactions   Simvastatin Other (See Comments)    Leg cramps   Zoloft [Sertraline] Nausea Only   Codeine Other (See Comments)    Bad Headaches   Coreg Other (See Comments)    "Made my legs hurt"   Lisinopril Cough   Tizanidine Hcl Rash and Other (See Comments)    Hypotension, also     Home Medications  Prior to Admission medications   Medication Sig Start Date End Date Taking? Authorizing Provider  acetaminophen (TYLENOL) 325 MG tablet Take 2 tablets (650 mg total) by mouth every 6 (six) hours as needed for mild pain or moderate pain. 09/14/22  Yes Arrien, Jimmy Picket, MD  diphenhydrAMINE (BENADRYL) 25 MG tablet Take 50 mg by mouth at bedtime.   Yes [provider]  ELIQUIS 5 MG TABS tablet Take 5 mg by mouth 2 (two) times daily. 12/31/22  Yes [provider]  folic acid  (FOLVITE) 1 MG tablet Take 1 tablet (1 mg total) by mouth daily. 11/29/22  Yes Owens Shark, NP  lidocaine-prilocaine (EMLA) cream APPLY 1 APPLICATION AS NEEDED. APPLY TO PORT SITE AN HOUR BEFORE PORT TO BE ACCESSED Patient taking differently: Apply 1 Application topically as needed (for port access- one hour prior to). 01/01/23  Yes Ladell Pier, MD  LORazepam (ATIVAN) 0.5 MG tablet Take 1 tablet (0.5 mg total) by mouth 2 (two) times daily as needed for anxiety. Patient taking differently: Take 0.5 mg by mouth See admin instructions. Take 0.5 mg by mouth at bedtime and an additional 0.5 mg once day as needed for anxiety 01/01/23  Yes Ladell Pier, MD  magnesium oxide (MAG-OX) 400 (240 Mg) MG tablet Take 1 tablet (400 mg total) by mouth daily. 12/05/22  Yes Owens Shark, NP  metoprolol tartrate (LOPRESSOR) 100 MG tablet Take 1 tablet (100 mg total) by mouth 2 (two) times daily. Patient taking differently: Take 100 mg by mouth See admin instructions. Take 100 mg by mouth two times a day and hold if Systolic number is less than 100 11/17/22 01/12/23 Yes Donne Hazel, MD  ondansetron (ZOFRAN) 8 MG tablet Take 1 tablet (8 mg total) by mouth every 8 (eight) hours as needed for nausea or vomiting (starting using day 3 after treatment as needed for nausea/vomiting). 11/27/22  Yes Ladell Pier, MD  oxyCODONE (OXY IR/ROXICODONE) 5 MG immediate release tablet Take 1 tablet (5 mg total) by mouth every 6 (six) hours as needed for severe pain. Patient taking differently: Take 5 mg by mouth See admin instructions. Take 5 mg by mouth at bedtime and an additional 5 mg three times a day as needed for pain 11/23/22  Yes Ladell Pier, MD  pantoprazole (PROTONIX) 40 MG tablet Take 1 tablet (40 mg total) by mouth 2 (two) times daily before a meal. Patient taking differently: Take 40 mg by mouth daily before breakfast. 12/18/22 02/16/23 Yes Choi,  Jennifer, DO  potassium chloride (KLOR-CON M) 10 MEQ tablet Take 1  tablet (10 mEq total) by mouth 2 (two) times daily. Take #2 tablets twice daily on 2/28, then #2 tablets daily Patient taking differently: Take 30 mEq by mouth daily. 12/20/22  Yes Ladell Pier, MD  prochlorperazine (COMPAZINE) 10 MG tablet Take 1 tablet (10 mg total) by mouth every 6 (six) hours as needed for nausea or vomiting. 11/27/22  Yes Ladell Pier, MD  dexamethasone (DECADRON) 2 MG tablet Take 5 tabs (10mg ) 10 pm night prior to first chemo and 6 am morning of first chemo Patient not taking: Reported on 01/12/2023 01/03/23   Owens Shark, NP  magic mouthwash (nystatin, diphenhydrAMINE, alum & mag hydroxide) suspension mixture Swish 5 mL by mouth for 1 minute then spit; may use up to four times daily. Patient not taking: Reported on 12/29/2022 12/05/22   Owens Shark, NP  nystatin (MYCOSTATIN/NYSTOP) powder Apply 1 Application topically 3 (three) times daily. Patient not taking: Reported on 01/12/2023 12/05/22   Owens Shark, NP  sucralfate (CARAFATE) 1 g tablet Take 1 tablet (1 g total) by mouth 4 (four) times daily -  with meals and at bedtime. Patient not taking: Reported on 01/12/2023 12/18/22 02/16/23  Dessa Phi, DO

## 2023-01-13 NOTE — Progress Notes (Signed)
  Transition of Care [facility]) Screening Note   Patient Details  Name: ROSEBUD SHARPLESS Date of Birth: [birth year]   Transition of Care [facility]) CM/SW Contact:    Henrietta Dine, RN Phone Number: 01/13/2023, 10:10 AM    Transition of Care Department [facility]) has reviewed patient and no TOC needs have been identified at this time. We will continue to monitor patient advancement through interdisciplinary progression rounds. If new patient transition needs arise, please place a TOC consult.

## 2023-01-13 NOTE — Progress Notes (Signed)
Date and time results received: 01/13/23 7:34 am (use smartphrase ".now" to insert current time)  Test: Hgb Critical Value: 6.7  Name of Provider Notified: 7:36 am  Orders Received? Or Actions Taken?:  Await response

## 2023-01-14 DIAGNOSIS — B9561 Methicillin susceptible Staphylococcus aureus infection as the cause of diseases classified elsewhere: Secondary | ICD-10-CM

## 2023-01-14 DIAGNOSIS — R7881 Bacteremia: Secondary | ICD-10-CM

## 2023-01-14 DIAGNOSIS — J910 Malignant pleural effusion: Secondary | ICD-10-CM | POA: Diagnosis not present

## 2023-01-14 LAB — BPAM RBC
Blood Product Expiration Date: 202404032359
Blood Product Expiration Date: 202404062359
ISSUE DATE / TIME: 202403230941
ISSUE DATE / TIME: 202403231425
Unit Type and Rh: 600
Unit Type and Rh: 600

## 2023-01-14 LAB — COMPREHENSIVE METABOLIC PANEL
ALT: 13 U/L (ref 0–44)
AST: 14 U/L — ABNORMAL LOW (ref 15–41)
Albumin: 1.8 g/dL — ABNORMAL LOW (ref 3.5–5.0)
Alkaline Phosphatase: 103 U/L (ref 38–126)
Anion gap: 10 (ref 5–15)
BUN: 14 mg/dL (ref 8–23)
CO2: 28 mmol/L (ref 22–32)
Calcium: 7.2 mg/dL — ABNORMAL LOW (ref 8.9–10.3)
Chloride: 101 mmol/L (ref 98–111)
Creatinine, Ser: 1.39 mg/dL — ABNORMAL HIGH (ref 0.44–1.00)
GFR, Estimated: 39 mL/min — ABNORMAL LOW (ref >60)
Glucose, Bld: 114 mg/dL — ABNORMAL HIGH (ref 70–99)
Potassium: 2.4 mmol/L — CL (ref 3.5–5.1)
Sodium: 139 mmol/L (ref 135–145)
Total Bilirubin: 0.5 mg/dL (ref 0.3–1.2)
Total Protein: 5.7 g/dL — ABNORMAL LOW (ref 6.5–8.1)

## 2023-01-14 LAB — CBC
HCT: 29 % — ABNORMAL LOW (ref 36.0–46.0)
Hemoglobin: 9.5 g/dL — ABNORMAL LOW (ref 12.0–15.0)
MCH: 30.2 pg (ref 26.0–34.0)
MCHC: 32.8 g/dL (ref 30.0–36.0)
MCV: 92.1 fL (ref 80.0–100.0)
Platelets: 160 10*3/uL (ref 150–400)
RBC: 3.15 MIL/uL — ABNORMAL LOW (ref 3.87–5.11)
RDW: 19.1 % — ABNORMAL HIGH (ref 11.5–15.5)
WBC: 26.5 10*3/uL — ABNORMAL HIGH (ref 4.0–10.5)
nRBC: 0.3 % — ABNORMAL HIGH (ref 0.0–0.2)

## 2023-01-14 LAB — TYPE AND SCREEN
ABO/RH(D): A NEG
Antibody Screen: NEGATIVE
Unit division: 0
Unit division: 0

## 2023-01-14 LAB — MAGNESIUM: Magnesium: 1.3 mg/dL — ABNORMAL LOW (ref 1.7–2.4)

## 2023-01-14 MED ORDER — APIXABAN 5 MG PO TABS
5.0000 mg | ORAL_TABLET | Freq: Two times a day (BID) | ORAL | Status: DC
  Administered 2023-01-14 – 2023-01-19 (×11): 5 mg via ORAL
  Filled 2023-01-14 (×11): qty 1

## 2023-01-14 MED ORDER — POTASSIUM CHLORIDE 10 MEQ/100ML IV SOLN
10.0000 meq | INTRAVENOUS | Status: AC
  Administered 2023-01-14 (×3): 10 meq via INTRAVENOUS
  Filled 2023-01-14 (×3): qty 100

## 2023-01-14 MED ORDER — POTASSIUM CHLORIDE CRYS ER 20 MEQ PO TBCR
40.0000 meq | EXTENDED_RELEASE_TABLET | Freq: Three times a day (TID) | ORAL | Status: AC
  Administered 2023-01-14 (×2): 40 meq via ORAL
  Filled 2023-01-14 (×2): qty 2
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
## 2023-01-14 NOTE — Consult Note (Signed)
[facility]    Date of Admission:  01/12/2023     Reason for Consult: mssa bacteremia    Referring Provider: Arlean Hopping     Lines:  Chronic right port-a-cath  Abx: 3/23-c cefazolin  3/22 azith/ctrx        Assessment: 77 yo female with hx low grade follicular lymphoma s/p 6 cycle chemo, breast cancer s/p tx, most recently metastatic nsclc (right pleural effusion/lumbothoracic spine mets), on chemo last cycline 3/14 check point inhibitor, taxol/platin therapy, admitted for fatigue/anemia, found to have high burden mssa bacteremia  I am concerned about the seeding of it with the port I am concerned she is relatively assymptomatic and this could have been going on for at least several days  Her wbc is also very high but she did receive dexa 3/13-3/23. However, I want to make sure she doesn't  have any distant intraabd abscesses  Her immunosuppression/chemo render her poor inflammatory response probably to this nasty infection   IE needs to also be r/o and the port will need to be removed  Plan: Continue cefazolin Repeat blood culture until clearance Tte and if negative TEE next week Ct abd pelv Please discuss with onc/radiology to remove port-a-cath Discussed with primary team    I spent more than 80 minute reviewing data/chart, and coordinating care and >50% direct face to face time providing counseling/discussing diagnostics/treatment plan with patient       ------------------------------------------------ Principal Problem:   Pleural effusion, malignant Active Problems:   Essential hypertension   Dyslipidemia, goal LDL below 100   Non Hodgkin's lymphoma ([place])   Lung mass   Metastatic non-small cell lung cancer ([place])   Malignant pleural effusion   CKD (chronic kidney disease), stage III ([place])   Chronic HFrEF (heart failure with reduced ejection fraction) ([place])    HPI: Crystal [name] is a 77 y.o. female  with complicated hx cancer, currently on chemo for nsclc stage 4, admitted for dyspnea/fatigue in setting acute on chronic anemia, found also to have high burden mssa bacteremia   Hx via patient/chart She had blood transfusion here and felt much better almost baseline She also had the pleurex drain removed this admission as there was no further drainage and pulm thinks it has auto-pleurodesed  She has had no fever, cough, chest pain Wbc 30 Ct chest showed complex right pleural effusion decreased in size and developed a rhind per pulm/ccm.  Bcx on admission grew 2 of 2 set mssa and she was started on cefazolin  She has no focal pain, headache, visual change, eye pain, port site pain  Oncology review: 3/14 lasta chemo carboplatin/taxol/atezolizumab. 3/20 last onc visit "seems to tolerate well" Cycle 2 of chemo planned for 01/25/23 Full oncology timeline: -Hx of low-grade follicular lymphoma involving right parotid mass, status post an excisional biopsy on 11/28/2010.  Limited to above diaphragm. Progressive left neck nodes on exam 08/18/2013. S/p bendamustine/rituxan 6 cycle by 01/2014 -Stage I right-sided breast cancer diagnosed in 1998 -Non-small cell lung cancer dx'ed 08/2022 low back pain presentation - MRI lumbar spine 09/03/2022 suggestive of L2 lesion metastatic disease MRI thoracic 09/13/2022 - T5 and T6 lesions suspicious for metastatic disease CT chest 09/13/2022 - right middle lobe mass, moderate to large right pleural effusion. Thoracentesis 09/21/2022 - adenocarcinoma Radiation L2 10/05/2022-10/19/2022 11/06/2022 L2 biopsy-metastatic non-small cell carcinoma consistent with lung primary CT chest 11/11/2022 - acute left upper lobe and left lower lobe pulmonary emboli,  interval enlargement of a large right pleural effusion with near complete collapse of the right lower lobe, right middle lobe pulmonary mass with increased right middle lobe volume loss Cycle 1  carboplatin/Alimta/Pembrolizumab 11/29/2022 Admission 12/08/2022 with pancytopenia, mucositis, and an ulcerated skin rash. GI bleeding 12/16/2022 dark stool Chemo changed to carboplatin, taxol, and atezolizumab 3/14  Family History  Problem Relation Age of Onset   Heart Problems Mother        CABG   Chronic Renal Failure Mother    Heart Problems Father    Hypertension Sister    Cancer Sister        bladder   Migraines Sister    Heart attack Sister    Hypertension Sister        x2   Lupus Brother    Hypertension Brother        and lupus   Heart Problems Maternal Grandmother    Stroke Maternal Grandfather    Cancer Paternal Grandmother        stomach   Heart Problems Paternal Grandfather     Social History   Tobacco Use   Smoking status: Never   Smokeless tobacco: Never  Vaping Use   Vaping Use: Never used  Substance Use Topics   Alcohol use: Yes    Comment: social    Drug use: No    Allergies  Allergen Reactions   Simvastatin Other (See Comments)    Leg cramps   Zoloft [Sertraline] Nausea Only   Codeine Other (See Comments)    Bad Headaches   Coreg Other (See Comments)    "Made my legs hurt"   Lisinopril Cough   Tizanidine Hcl Rash and Other (See Comments)    Hypotension, also    Review of Systems: ROS All Other ROS was negative, except mentioned above   Past Medical History:  Diagnosis Date   Anxiety    Atherosclerosis of aorta (HCC)    Breast cancer ([place]) 1998   (Rt) lumpectomy dx 1999; Dr. Benay Spice   Dyslipidemia, goal LDL below 130    Essential hypertension    GERD (gastroesophageal reflux disease)    Hypercholesterolemia    IBS (irritable bowel syndrome)    Lymphoma ([place])    lymphoma dx 11/28/10 - left neck   non hodgkins lymphoma 12/2010   right aprotid gland   Nonischemic cardiomyopathy ([place]) 2008   ? Doxorubincin induced; essentially resolved as of echo in January 2014, current EF 50-55%. Grade 1 diastolic dysfunction.   Obesity    Severe  obesity (BMI >= 40) (HCC) 05/21/2013   Improve to BMI of 39 by July 2015   Tremor        Scheduled Meds:  Chlorhexidine Gluconate Cloth  6 each Topical Daily   folic acid  1 mg Oral Daily   metoprolol tartrate  100 mg Oral BID   oxyCODONE  5 mg Oral QHS   pantoprazole  40 mg Oral BID AC   Continuous Infusions:  azithromycin 500 mg (01/13/23 2145)    ceFAZolin (ANCEF) IV 2 g (01/14/23 [id number])   lactated ringers 125 mL/hr at 01/13/23 0638   PRN Meds:.acetaminophen **OR** acetaminophen, LORazepam, ondansetron **OR** ondansetron (ZOFRAN) IV, oxyCODONE, senna-docusate, sodium chloride flush   OBJECTIVE: Blood pressure 116/70, pulse (!) 115, temperature 98.1 F (36.7 C), temperature source Oral, resp. rate (!) 22, height 5\' 5"  (1.651 m), weight 94 kg, SpO2 90 %.  Physical Exam  General/constitutional: no distress, pleasant HEENT: Normocephalic, PER, Conj Clear, EOMI, Oropharynx clear  Neck supple CV: rrr no mrg Lungs: clear to auscultation, normal respiratory effort Abd: Soft, Nontender Ext: no edema Skin: No Rash Neuro: nonfocal MSK: no peripheral joint swelling/tenderness/warmth; back spines nontender   Central line presence: right chest port site no tenderness/erythema   Lab Results Lab Results  Component Value Date   WBC 26.5 (H) 01/14/2023   HGB 9.5 (L) 01/14/2023   HCT 29.0 (L) 01/14/2023   MCV 92.1 01/14/2023   PLT 160 01/14/2023    Lab Results  Component Value Date   CREATININE 1.39 (H) 01/14/2023   BUN 14 01/14/2023   NA 139 01/14/2023   K 2.4 (LL) 01/14/2023   CL 101 01/14/2023   CO2 28 01/14/2023    Lab Results  Component Value Date   ALT 13 01/14/2023   AST 14 (L) 01/14/2023   ALKPHOS 103 01/14/2023   BILITOT 0.5 01/14/2023      Microbiology: Recent Results (from the past 240 hour(s))  Culture, blood (Routine X 2) w Reflex to ID Panel     Status: None (Preliminary result)   Collection Time: 01/12/23  9:25 PM   Specimen: BLOOD  Result  Value Ref Range Status   Specimen Description   Final    BLOOD LEFT ANTECUBITAL Performed at [facility], [place] [address] [place], [place] [address]    Special Requests   Final    BOTTLES DRAWN AEROBIC AND ANAEROBIC Blood Culture results may not be optimal due to an inadequate volume of blood received in culture bottles Performed at [facility] [address], [place], [place] [address]    Culture  Setup Time   Final    GRAM POSITIVE COCCI IN CLUSTERS AEROBIC BOTTLE ONLY CRITICAL VALUE NOTED.  VALUE IS CONSISTENT WITH PREVIOUSLY REPORTED AND CALLED VALUE.    Culture   Final    GRAM POSITIVE COCCI IDENTIFICATION TO FOLLOW Performed at [facility], [place] [address], [place], [place] [address]    Report Status PENDING  Incomplete  Culture, blood (Routine X 2) w Reflex to ID Panel     Status: Abnormal (Preliminary result)   Collection Time: 01/12/23 10:20 PM   Specimen: BLOOD  Result Value Ref Range Status   Specimen Description   Final    BLOOD PORTA CATH Performed at [facility] [address], [place], [place] [address]    Special Requests   Final    BOTTLES DRAWN AEROBIC AND ANAEROBIC Blood Culture adequate volume Performed at [facility] [address], [place], [place] [address]    Culture  Setup Time   Final    ANAEROBIC BOTTLE ONLY GRAM POSITIVE COCCI IN CLUSTERS CRITICAL RESULT CALLED TO, READ BACK BY AND VERIFIED WITH: [place] 01/13/23 @ 2150 BY AB    Culture (A)  Final    STAPHYLOCOCCUS AUREUS SUSCEPTIBILITIES TO FOLLOW Performed at [facility], [place] [address], [place], [place] [address]    Report Status PENDING  Incomplete  Blood Culture ID Panel (Reflexed)     Status: Abnormal   Collection Time: 01/12/23 10:20 PM  Result Value Ref Range Status   Enterococcus faecalis NOT DETECTED NOT DETECTED Final   Enterococcus Faecium NOT DETECTED NOT DETECTED Final    Listeria monocytogenes NOT DETECTED NOT DETECTED Final   Staphylococcus species DETECTED (A) NOT DETECTED Final    Comment: CRITICAL RESULT CALLED TO, READ BACK BY AND VERIFIED WITH: [place] 01/13/23 @ 2150 BY  AB    Staphylococcus aureus (BCID) DETECTED (A) NOT DETECTED Final    Comment: CRITICAL RESULT CALLED TO, READ BACK BY AND VERIFIED WITH: PHARMD E. JACKSON 01/13/23 @ 2150 BY AB    Staphylococcus epidermidis NOT DETECTED NOT DETECTED Final   Staphylococcus lugdunensis NOT DETECTED NOT DETECTED Final   Streptococcus species NOT DETECTED NOT DETECTED Final   Streptococcus agalactiae NOT DETECTED NOT DETECTED Final   Streptococcus pneumoniae NOT DETECTED NOT DETECTED Final   Streptococcus pyogenes NOT DETECTED NOT DETECTED Final   A.calcoaceticus-baumannii NOT DETECTED NOT DETECTED Final   Bacteroides fragilis NOT DETECTED NOT DETECTED Final   Enterobacterales NOT DETECTED NOT DETECTED Final   Enterobacter cloacae complex NOT DETECTED NOT DETECTED Final   Escherichia coli NOT DETECTED NOT DETECTED Final   Klebsiella aerogenes NOT DETECTED NOT DETECTED Final   Klebsiella oxytoca NOT DETECTED NOT DETECTED Final   Klebsiella pneumoniae NOT DETECTED NOT DETECTED Final   Proteus species NOT DETECTED NOT DETECTED Final   Salmonella species NOT DETECTED NOT DETECTED Final   Serratia marcescens NOT DETECTED NOT DETECTED Final   Haemophilus influenzae NOT DETECTED NOT DETECTED Final   Neisseria meningitidis NOT DETECTED NOT DETECTED Final   Pseudomonas aeruginosa NOT DETECTED NOT DETECTED Final   Stenotrophomonas maltophilia NOT DETECTED NOT DETECTED Final   Candida albicans NOT DETECTED NOT DETECTED Final   Candida auris NOT DETECTED NOT DETECTED Final   Candida glabrata NOT DETECTED NOT DETECTED Final   Candida krusei NOT DETECTED NOT DETECTED Final   Candida parapsilosis NOT DETECTED NOT DETECTED Final   Candida tropicalis NOT DETECTED NOT DETECTED Final   Cryptococcus  neoformans/gattii NOT DETECTED NOT DETECTED Final   Meth resistant mecA/C and MREJ NOT DETECTED NOT DETECTED Final    Comment: Performed at [facility], [address], [place], [place] [address]     Serology:    Imaging: If present, new imagings (plain films, ct scans, and mri) have been personally visualized and interpreted; radiology reports have been reviewed. Decision making incorporated into the Impression / Recommendations.  3/22 ct chest CT chest done in ER reveals: 1. Interval right tunneled pleural drainage catheter placement. Complex right pleural effusion has decreased in size since prior examination but now demonstrates increasing pleural thickening and enhancement as well as partial loculation. Small residual pleural fluid persists. Basilar predominant right-sided volume loss noted. 2. Pulmonary mass within the lateral segment of the right middle lobe is difficult to accurately measure given adjacent pleural fluid and atelectatic lung measuring roughly 2.3 x 3.6 cm. 3. Progressive shotty right paratracheal, right hilar, and subcarinal adenopathy which is nonspecific and may reflect progressive nodal metastatic disease versus reactive adenopathy. 4. Mild coronary artery calcification. 5. Morphologic changes in keeping with pulmonary arterial hypertension.  Jabier Mutton, [facility] for Infectious [place] [phone] pager    01/14/2023, 12:13 PM

## 2023-01-15 ENCOUNTER — Inpatient Hospital Stay (HOSPITAL_COMMUNITY): Payer: No Typology Code available for payment source

## 2023-01-15 ENCOUNTER — Encounter: Payer: Self-pay | Admitting: Nurse Practitioner

## 2023-01-15 DIAGNOSIS — B9561 Methicillin susceptible Staphylococcus aureus infection as the cause of diseases classified elsewhere: Secondary | ICD-10-CM

## 2023-01-15 DIAGNOSIS — T80211A Bloodstream infection due to central venous catheter, initial encounter: Secondary | ICD-10-CM

## 2023-01-15 DIAGNOSIS — J910 Malignant pleural effusion: Secondary | ICD-10-CM | POA: Diagnosis not present

## 2023-01-15 DIAGNOSIS — K769 Liver disease, unspecified: Secondary | ICD-10-CM | POA: Diagnosis not present

## 2023-01-15 DIAGNOSIS — R7881 Bacteremia: Secondary | ICD-10-CM

## 2023-01-15 DIAGNOSIS — T80212A Local infection due to central venous catheter, initial encounter: Secondary | ICD-10-CM | POA: Diagnosis not present

## 2023-01-15 DIAGNOSIS — T80211D Bloodstream infection due to central venous catheter, subsequent encounter: Secondary | ICD-10-CM

## 2023-01-15 DIAGNOSIS — N183 Chronic kidney disease, stage 3 unspecified: Secondary | ICD-10-CM

## 2023-01-15 DIAGNOSIS — C3490 Malignant neoplasm of unspecified part of unspecified bronchus or lung: Secondary | ICD-10-CM | POA: Diagnosis not present

## 2023-01-15 DIAGNOSIS — C3491 Malignant neoplasm of unspecified part of right bronchus or lung: Secondary | ICD-10-CM

## 2023-01-15 DIAGNOSIS — Z452 Encounter for adjustment and management of vascular access device: Secondary | ICD-10-CM | POA: Diagnosis not present

## 2023-01-15 DIAGNOSIS — N2889 Other specified disorders of kidney and ureter: Secondary | ICD-10-CM | POA: Diagnosis not present

## 2023-01-15 HISTORY — PX: IR REMOVAL TUN ACCESS W/ PORT W/O FL MOD SED: IMG2290

## 2023-01-15 HISTORY — DX: Bacteremia: R78.81

## 2023-01-15 HISTORY — DX: Bloodstream infection due to central venous catheter, initial encounter: T80.211A

## 2023-01-15 HISTORY — DX: Methicillin susceptible Staphylococcus aureus infection as the cause of diseases classified elsewhere: B95.61

## 2023-01-15 LAB — CBC
HCT: 29.6 % — ABNORMAL LOW (ref 36.0–46.0)
Hemoglobin: 9.4 g/dL — ABNORMAL LOW (ref 12.0–15.0)
MCH: 29.2 pg (ref 26.0–34.0)
MCHC: 31.8 g/dL (ref 30.0–36.0)
MCV: 91.9 fL (ref 80.0–100.0)
Platelets: 148 10*3/uL — ABNORMAL LOW (ref 150–400)
RBC: 3.22 MIL/uL — ABNORMAL LOW (ref 3.87–5.11)
RDW: 18.5 % — ABNORMAL HIGH (ref 11.5–15.5)
WBC: 22.2 10*3/uL — ABNORMAL HIGH (ref 4.0–10.5)
nRBC: 0.1 % (ref 0.0–0.2)

## 2023-01-15 LAB — COMPREHENSIVE METABOLIC PANEL
ALT: 8 U/L (ref 0–44)
AST: 17 U/L (ref 15–41)
Albumin: 1.9 g/dL — ABNORMAL LOW (ref 3.5–5.0)
Alkaline Phosphatase: 105 U/L (ref 38–126)
Anion gap: 11 (ref 5–15)
BUN: 14 mg/dL (ref 8–23)
CO2: 27 mmol/L (ref 22–32)
Calcium: 7 mg/dL — ABNORMAL LOW (ref 8.9–10.3)
Chloride: 101 mmol/L (ref 98–111)
Creatinine, Ser: 1.39 mg/dL — ABNORMAL HIGH (ref 0.44–1.00)
GFR, Estimated: 39 mL/min — ABNORMAL LOW (ref >60)
Glucose, Bld: 101 mg/dL — ABNORMAL HIGH (ref 70–99)
Potassium: 2.9 mmol/L — ABNORMAL LOW (ref 3.5–5.1)
Sodium: 139 mmol/L (ref 135–145)
Total Bilirubin: 0.4 mg/dL (ref 0.3–1.2)
Total Protein: 5.6 g/dL — ABNORMAL LOW (ref 6.5–8.1)

## 2023-01-15 LAB — CULTURE, BLOOD (ROUTINE X 2): Special Requests: ADEQUATE

## 2023-01-15 LAB — ECHOCARDIOGRAM COMPLETE
Calc EF: 48 %
Height: 65 in
S' Lateral: 3.9 cm
Single Plane A2C EF: 51 %
Single Plane A4C EF: 43.4 %
Weight: 3315.72 oz

## 2023-01-15 LAB — MAGNESIUM: Magnesium: 1.3 mg/dL — ABNORMAL LOW (ref 1.7–2.4)

## 2023-01-15 MED ORDER — FENTANYL CITRATE (PF) 100 MCG/2ML IJ SOLN
INTRAMUSCULAR | Status: AC | PRN
  Administered 2023-01-15 (×2): 50 ug via INTRAVENOUS

## 2023-01-15 MED ORDER — LIDOCAINE-EPINEPHRINE 1 %-1:100000 IJ SOLN
INTRAMUSCULAR | Status: AC
  Administered 2023-01-15: 20 mL via INTRADERMAL
  Filled 2023-01-15: qty 1

## 2023-01-15 MED ORDER — POTASSIUM CHLORIDE CRYS ER 20 MEQ PO TBCR
40.0000 meq | EXTENDED_RELEASE_TABLET | Freq: Once | ORAL | Status: AC
  Administered 2023-01-15: 40 meq via ORAL
  Filled 2023-01-15: qty 2

## 2023-01-15 MED ORDER — MAGNESIUM SULFATE 4 GM/100ML IV SOLN
4.0000 g | Freq: Once | INTRAVENOUS | Status: AC
  Administered 2023-01-15: 4 g via INTRAVENOUS
  Filled 2023-01-15: qty 100

## 2023-01-15 MED ORDER — PERFLUTREN LIPID MICROSPHERE
1.0000 mL | INTRAVENOUS | Status: AC | PRN
  Administered 2023-01-15: 2 mL via INTRAVENOUS

## 2023-01-15 MED ORDER — IOHEXOL 9 MG/ML PO SOLN
500.0000 mL | ORAL | Status: AC
  Administered 2023-01-15 (×2): 500 mL via ORAL

## 2023-01-15 MED ORDER — IOHEXOL 300 MG/ML  SOLN
80.0000 mL | Freq: Once | INTRAMUSCULAR | Status: AC | PRN
  Administered 2023-01-15: 80 mL via INTRAVENOUS

## 2023-01-15 MED ORDER — SODIUM CHLORIDE (PF) 0.9 % IJ SOLN
INTRAMUSCULAR | Status: AC
  Filled 2023-01-15: qty 50

## 2023-01-15 MED ORDER — FENTANYL CITRATE (PF) 100 MCG/2ML IJ SOLN
INTRAMUSCULAR | Status: AC
  Filled 2023-01-15: qty 2

## 2023-01-15 MED ORDER — LIDOCAINE HCL 1 % IJ SOLN
INTRAMUSCULAR | Status: AC
  Administered 2023-01-15: 1 mL via INTRADERMAL
  Filled 2023-01-15: qty 20

## 2023-01-15 MED ORDER — POTASSIUM CHLORIDE 20 MEQ PO PACK
60.0000 meq | PACK | Freq: Once | ORAL | Status: AC
  Administered 2023-01-15: 60 meq via ORAL
  Filled 2023-01-15: qty 3
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
## 2023-01-15 NOTE — Procedures (Signed)
PROCEDURE SUMMARY:  Successful placement of image-guided double lumen PICC line to the left basilic vein. Length 42 cm. Tip at lower SVC/RA. No complications. EBL = < 5 ml. Ready for use.  Please see imaging section of Epic for full dictation.   Theresa Duty, NP 01/15/2023 3:06 PM
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
## 2023-01-15 NOTE — Progress Notes (Signed)
[facility] for Infectious Disease  Date of Admission:  01/12/2023           Reason for visit: Follow up on MSSA Bacteremia  Current antibiotics: Cefazolin    ASSESSMENT:    77 y.o. female admitted with:  MSSA bacteremia: Patient admitted 01/12/23 found to have MSSA bacteremia in setting of Port for chemotherapy purposes.  Blood cx positive from 01/12/23 and currently on cefazolin.  TTE and  CT abdomen/pelvis pending for intra-abdominal complication.  Port-A-Cath remains in place. Metastatic non-small cell lung cancer with malignant pleural effusion: Currently on chemotherapy and followed by oncology.  Status post Pleurx catheter drainage and removal during this admission on 3/23. CKD3: Stable at baseline. Sepsis: Due to #1.  RECOMMENDATIONS:    Continue cefazolin Await TTE and CT abdomen/pelvis Recommend Port-A-Cath removal in the setting of MSSA bacteremia.  Will need peripheral IV access established Follow repeat blood cultures obtained yesterday and would repeat blood cultures following PAC removal Will follow   Principal Problem:   Pleural effusion, malignant Active Problems:   Essential hypertension   Dyslipidemia, goal LDL below 100   Non Hodgkin's lymphoma (HCC)   Lung mass   Metastatic non-small cell lung cancer (HCC)   Malignant pleural effusion   CKD (chronic kidney disease), stage III (HCC)   Chronic HFrEF (heart failure with reduced ejection fraction) (HCC)    MEDICATIONS:    Scheduled Meds:  apixaban  5 mg Oral BID   Chlorhexidine Gluconate Cloth  6 each Topical Daily   folic acid  1 mg Oral Daily   iohexol  500 mL Oral Q1H   metoprolol tartrate  100 mg Oral BID   oxyCODONE  5 mg Oral QHS   pantoprazole  40 mg Oral BID AC   Continuous Infusions:   ceFAZolin (ANCEF) IV 2 g (01/15/23 0646)   lactated ringers 125 mL/hr at 01/13/23 0638   PRN Meds:.acetaminophen **OR** acetaminophen, LORazepam, ondansetron **OR** ondansetron (ZOFRAN) IV,  oxyCODONE, senna-docusate, sodium chloride flush  SUBJECTIVE:   24 hour events:  No acute events WBC trending down Echo, CT pending O2 sats documented room air Repeat blood cx obtained  No new complaints.  Reports feeling relatively well.  She has no peripheral IV access at this time  Review of Systems  All other systems reviewed and are negative.     OBJECTIVE:   Blood pressure 122/80, pulse (!) 121, temperature 98.5 F (36.9 C), temperature source Oral, resp. rate 16, height 5\' 5"  (1.651 m), weight 94 kg, SpO2 95 %. Body mass index is 34.49 kg/m.  Physical Exam Constitutional:      General: She is not in acute distress.    Appearance: She is well-developed.  HENT:     Head: Normocephalic and atraumatic.  Cardiovascular:     Comments: PAC in place and being accessed.  Pulmonary:     Effort: Pulmonary effort is normal. No respiratory distress.     Comments: PleurX removed. Abdominal:     General: There is no distension.     Palpations: Abdomen is soft.  Musculoskeletal:     Cervical back: Normal range of motion and neck supple.  Skin:    General: Skin is warm and dry.  Neurological:     General: No focal deficit present.     Mental Status: She is alert and oriented to person, place, and time.  Psychiatric:        Mood and Affect: Mood normal.  Behavior: Behavior normal.      Lab Results: Lab Results  Component Value Date   WBC 22.2 (H) 01/15/2023   HGB 9.4 (L) 01/15/2023   HCT 29.6 (L) 01/15/2023   MCV 91.9 01/15/2023   PLT 148 (L) 01/15/2023    Lab Results  Component Value Date   NA 139 01/15/2023   K 2.9 (L) 01/15/2023   CO2 27 01/15/2023   GLUCOSE 101 (H) 01/15/2023   BUN 14 01/15/2023   CREATININE 1.39 (H) 01/15/2023   CALCIUM 7.0 (L) 01/15/2023   GFRNONAA 39 (L) 01/15/2023   GFRAA 48 (L) 06/01/2020    Lab Results  Component Value Date   ALT 8 01/15/2023   AST 17 01/15/2023   ALKPHOS 105 01/15/2023   BILITOT 0.4 01/15/2023     No results found for: "CRP"     Component Value Date/Time   ESRSEDRATE 16 02/16/2014 1425     I have reviewed the micro and lab results in Epic.  Imaging: No results found.   Imaging independently reviewed in Epic.    Raynelle Highland for [facility] [phone] pager 01/15/2023, 8:48 AM
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
## 2023-01-15 NOTE — Progress Notes (Signed)
  Echocardiogram 2D Echocardiogram has been performed.  Crystal [name] 01/15/2023, 3:34 PM

## 2023-01-15 NOTE — Procedures (Signed)
Pre Procedure Dx: Poor venous access, Bacteriemia Post Procedural Dx: Same  Successful removal of right IJ approach port-a-cath. Given lack of obvious findings of infection of the port pocket, it was closed via primary intention.  Estimated Blood Loss: Minimal Complications: None immediate.  Ronny Bacon, MD Pager #: [phone]

## 2023-01-16 ENCOUNTER — Inpatient Hospital Stay: Payer: No Typology Code available for payment source

## 2023-01-16 DIAGNOSIS — R7881 Bacteremia: Secondary | ICD-10-CM | POA: Diagnosis not present

## 2023-01-16 DIAGNOSIS — B9561 Methicillin susceptible Staphylococcus aureus infection as the cause of diseases classified elsewhere: Secondary | ICD-10-CM | POA: Diagnosis not present

## 2023-01-16 LAB — CBC
HCT: 29.1 % — ABNORMAL LOW (ref 36.0–46.0)
Hemoglobin: 9.3 g/dL — ABNORMAL LOW (ref 12.0–15.0)
MCH: 29.9 pg (ref 26.0–34.0)
MCHC: 32 g/dL (ref 30.0–36.0)
MCV: 93.6 fL (ref 80.0–100.0)
Platelets: 142 10*3/uL — ABNORMAL LOW (ref 150–400)
RBC: 3.11 MIL/uL — ABNORMAL LOW (ref 3.87–5.11)
RDW: 18.3 % — ABNORMAL HIGH (ref 11.5–15.5)
WBC: 18.7 10*3/uL — ABNORMAL HIGH (ref 4.0–10.5)
nRBC: 0 % (ref 0.0–0.2)

## 2023-01-16 LAB — COMPREHENSIVE METABOLIC PANEL
ALT: <5 U/L (ref 0–44)
AST: 13 U/L — ABNORMAL LOW (ref 15–41)
Albumin: 1.6 g/dL — ABNORMAL LOW (ref 3.5–5.0)
Alkaline Phosphatase: 91 U/L (ref 38–126)
Anion gap: 8 (ref 5–15)
BUN: 14 mg/dL (ref 8–23)
CO2: 26 mmol/L (ref 22–32)
Calcium: 7.2 mg/dL — ABNORMAL LOW (ref 8.9–10.3)
Chloride: 103 mmol/L (ref 98–111)
Creatinine, Ser: 1.2 mg/dL — ABNORMAL HIGH (ref 0.44–1.00)
GFR, Estimated: 47 mL/min — ABNORMAL LOW (ref >60)
Glucose, Bld: 99 mg/dL (ref 70–99)
Potassium: 3.3 mmol/L — ABNORMAL LOW (ref 3.5–5.1)
Sodium: 137 mmol/L (ref 135–145)
Total Bilirubin: 0.4 mg/dL (ref 0.3–1.2)
Total Protein: 5.4 g/dL — ABNORMAL LOW (ref 6.5–8.1)

## 2023-01-16 LAB — MAGNESIUM: Magnesium: 2 mg/dL (ref 1.7–2.4)

## 2023-01-16 MED ORDER — POTASSIUM CHLORIDE CRYS ER 20 MEQ PO TBCR
40.0000 meq | EXTENDED_RELEASE_TABLET | Freq: Once | ORAL | Status: AC
  Administered 2023-01-16: 40 meq via ORAL
  Filled 2023-01-16: qty 2

## 2023-01-16 MED ORDER — SODIUM CHLORIDE 0.9% FLUSH: 10.0000 mL | INTRAVENOUS | Status: DC | PRN

## 2023-01-16 MED ORDER — ORAL CARE MOUTH RINSE: 15.0000 mL | OROMUCOSAL | Status: DC | PRN

## 2023-01-16 NOTE — Care Management Important Message (Signed)
Important Message  Patient Details IM Letter given. Name: Crystal [name] MRN: [id number] Date of Birth: [birth year]   Medicare Important Message Given:  Yes     Kerin Salen 01/16/2023, 1:00 PM

## 2023-01-16 NOTE — Progress Notes (Signed)
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 low-attenuation lesions. These too small to completely characterize although favor small cysts. No specific imaging follow-up. There is a larger focus anteriorly along the left mid kidney which has been slowly increasing since 2014 now measuring up to 2.7 cm in diameter and has Hounsfield unit of 3. All of these are category Bosniak 1 and 2 lesions. No specific imaging follow-up. No collecting system dilatation. The ureters have normal course and caliber down to the bladder which has a preserved contour. Left lateral small bladder diverticula suggested. Nonspecific mild perinephric stranding. Stomach/Bowel: The large bowel has a normal course and  caliber. Normal appendix. Stomach and small bowel are nondilated. No free air or free fluid. Vascular/Lymphatic: Normal caliber aorta and IVC with some mild vascular calcifications. No developing abnormal lymph node enlargement identified in the abdomen and pelvis. Reproductive: Status post hysterectomy. No adnexal masses. Other: No free air or free fluid. The peritoneal nodule anterior to the transverse colon on the prior study of November 2023 is not seen on the current examination and is resolved. Musculoskeletal: Scattered degenerative changes of the spine and pelvis. Dictation cement within the vertebral body at L2. Multilevel stenosis along the spine. Once again there is a fracture of the lateral aspect of the left ninth rib. IMPRESSION: No developing mass lesion, fluid collection or lymph node enlargement in the abdomen and pelvis. No signs of abscess. Small cystic lesion along the upper aspect of the spleen has been slowly increasing since 2021. Favor a benign process. Left ninth rib fracture lateral. Basilar lung opacities and pleural effusions. Tiny left effusion is slightly larger than the prior CT. The right effusion has some pleural thickening and may be complex. Few bubbles of air. Recommend follow-up and correlation to prior chest CT Electronically Signed   By: Jill Side M.D.   On: 01/15/2023 12:37     Marzetta Board, MD, PhD [facility]  Between 7 am - 7  pm I am available, please contact me via Amion (for emergencies) or Securechat (non urgent messages)  Between 7 pm - 7 am I am not available, please contact night coverage MD/APP via Amion
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
## 2023-01-17 ENCOUNTER — Inpatient Hospital Stay (HOSPITAL_COMMUNITY): Payer: No Typology Code available for payment source | Admitting: Registered Nurse

## 2023-01-17 ENCOUNTER — Inpatient Hospital Stay: Payer: No Typology Code available for payment source

## 2023-01-17 ENCOUNTER — Encounter (HOSPITAL_COMMUNITY)
Admission: EM | Disposition: A | Discharge status: Home Health | Payer: Self-pay | Source: Home / Self Care | Attending: Internal Medicine

## 2023-01-17 ENCOUNTER — Inpatient Hospital Stay (HOSPITAL_COMMUNITY): Payer: No Typology Code available for payment source

## 2023-01-17 DIAGNOSIS — F419 Anxiety disorder, unspecified: Secondary | ICD-10-CM | POA: Diagnosis not present

## 2023-01-17 DIAGNOSIS — R7881 Bacteremia: Secondary | ICD-10-CM

## 2023-01-17 DIAGNOSIS — I081 Rheumatic disorders of both mitral and tricuspid valves: Secondary | ICD-10-CM | POA: Diagnosis not present

## 2023-01-17 DIAGNOSIS — I10 Essential (primary) hypertension: Secondary | ICD-10-CM

## 2023-01-17 DIAGNOSIS — B9562 Methicillin resistant Staphylococcus aureus infection as the cause of diseases classified elsewhere: Secondary | ICD-10-CM

## 2023-01-17 DIAGNOSIS — B9561 Methicillin susceptible Staphylococcus aureus infection as the cause of diseases classified elsewhere: Secondary | ICD-10-CM | POA: Diagnosis not present

## 2023-01-17 DIAGNOSIS — N289 Disorder of kidney and ureter, unspecified: Secondary | ICD-10-CM | POA: Diagnosis not present

## 2023-01-17 HISTORY — PX: TEE WITHOUT CARDIOVERSION: SHX5443

## 2023-01-17 LAB — CBC
HCT: 29 % — ABNORMAL LOW (ref 36.0–46.0)
Hemoglobin: 9.1 g/dL — ABNORMAL LOW (ref 12.0–15.0)
MCH: 29.4 pg (ref 26.0–34.0)
MCHC: 31.4 g/dL (ref 30.0–36.0)
MCV: 93.5 fL (ref 80.0–100.0)
Platelets: 141 10*3/uL — ABNORMAL LOW (ref 150–400)
RBC: 3.1 MIL/uL — ABNORMAL LOW (ref 3.87–5.11)
RDW: 17.8 % — ABNORMAL HIGH (ref 11.5–15.5)
WBC: 16 10*3/uL — ABNORMAL HIGH (ref 4.0–10.5)
nRBC: 0 % (ref 0.0–0.2)

## 2023-01-17 LAB — COMPREHENSIVE METABOLIC PANEL
ALT: <5 U/L (ref 0–44)
AST: 13 U/L — ABNORMAL LOW (ref 15–41)
Albumin: 1.6 g/dL — ABNORMAL LOW (ref 3.5–5.0)
Alkaline Phosphatase: 84 U/L (ref 38–126)
Anion gap: 7 (ref 5–15)
BUN: 15 mg/dL (ref 8–23)
CO2: 27 mmol/L (ref 22–32)
Calcium: 7.2 mg/dL — ABNORMAL LOW (ref 8.9–10.3)
Chloride: 103 mmol/L (ref 98–111)
Creatinine, Ser: 1.26 mg/dL — ABNORMAL HIGH (ref 0.44–1.00)
GFR, Estimated: 44 mL/min — ABNORMAL LOW (ref >60)
Glucose, Bld: 89 mg/dL (ref 70–99)
Potassium: 3.1 mmol/L — ABNORMAL LOW (ref 3.5–5.1)
Sodium: 137 mmol/L (ref 135–145)
Total Bilirubin: 0.5 mg/dL (ref 0.3–1.2)
Total Protein: 5.7 g/dL — ABNORMAL LOW (ref 6.5–8.1)

## 2023-01-17 LAB — ECHO TEE: S' Lateral: 3 cm

## 2023-01-17 SURGERY — ECHOCARDIOGRAM, TRANSESOPHAGEAL
Anesthesia: Monitor Anesthesia Care

## 2023-01-17 MED ORDER — LIDOCAINE 2% (20 MG/ML) 5 ML SYRINGE
INTRAMUSCULAR | Status: DC | PRN
  Administered 2023-01-17: 50 mg via INTRAVENOUS

## 2023-01-17 MED ORDER — PROPOFOL 10 MG/ML IV BOLUS
INTRAVENOUS | Status: DC | PRN
  Administered 2023-01-17: 30 mg via INTRAVENOUS
  Administered 2023-01-17: 100 ug/kg/min via INTRAVENOUS

## 2023-01-17 MED ORDER — SODIUM CHLORIDE 0.9 % IV SOLN: INTRAVENOUS | Status: DC

## 2023-01-17 MED ORDER — POTASSIUM CHLORIDE 10 MEQ/100ML IV SOLN
10.0000 meq | INTRAVENOUS | Status: AC
  Administered 2023-01-17 (×4): 10 meq via INTRAVENOUS
  Filled 2023-01-17 (×4): qty 100

## 2023-01-17 MED ORDER — POLYETHYLENE GLYCOL 3350 17 G PO PACK
17.0000 g | PACK | Freq: Every day | ORAL | Status: DC
  Filled 2023-01-17 (×2): qty 1

## 2023-01-17 NOTE — Progress Notes (Signed)
Brief ID Note:  Patient admitted with MSSA bacteremia in the setting of Port-A-Cath for chemotherapy.  This may have been related to her PAC or from skin breakdown following chemotherapy.  Nonetheless, removal of PAC recommended in this setting and was done on 01/15/23.  Unfortunately, she has poor peripheral access and they were unsuccessful obtaining PIV site.  Thus, she had to have a PICC line placed prior to adequate line holiday.  Oncology wants her to keep this PICC line in order to continue next cycle of chemotherapy as well as her antibiotic course.  Repeat blood cultures are NGTD and TEE planned today.  Will follow for TEE results to determine duration of therapy.    Raynelle Highland for Infectious Disease [facility] 01/17/2023, 6:21 AM

## 2023-01-17 NOTE — Anesthesia Preprocedure Evaluation (Addendum)
Anesthesia Evaluation  Patient identified by MRN, date of birth, ID band Patient awake    Reviewed: Allergy & Precautions, NPO status , Patient's Chart, lab work & pertinent test results  Airway Mallampati: III  TM Distance: >3 FB Neck ROM: Limited    Dental  (+) Dental Advisory Given, Teeth Intact   Pulmonary    Pulmonary exam normal breath sounds clear to auscultation       Cardiovascular hypertension, Pt. on medications  Rhythm:Regular Rate:Normal  Echo 01/15/2023  1. Left ventricular ejection fraction, by estimation, is 45 to 50%. The left ventricle has mildly decreased function. The left ventricle has no regional wall motion abnormalities. Left ventricular diastolic function could not be evaluated.   2. Right ventricular systolic function is normal. The right ventricular size is normal.   3. The mitral valve is normal in structure. No evidence of mitral valve regurgitation. No evidence of mitral stenosis.   4. The aortic valve is normal in structure. Aortic valve regurgitation is not visualized. No aortic stenosis is present.   5. The inferior vena cava is normal in size with greater than 50% respiratory variability, suggesting right atrial pressure of 3 mmHg.     Neuro/Psych   Anxiety        GI/Hepatic ,GERD  ,,  Endo/Other    Renal/GU Renal disease     Musculoskeletal   Abdominal  (+) + obese  Peds  Hematology   Anesthesia Other Findings   Reproductive/Obstetrics                             Anesthesia Physical Anesthesia Plan  ASA: 3  Anesthesia Plan: MAC   Post-op Pain Management:    Induction: Intravenous  PONV Risk Score and Plan: 2 and Treatment may vary due to age or medical condition, Propofol infusion and TIVA  Airway Management Planned: Natural Airway  Additional Equipment:   Intra-op Plan:   Post-operative Plan:   Informed Consent: I have reviewed the patients  History and Physical, chart, labs and discussed the procedure including the risks, benefits and alternatives for the proposed anesthesia with the patient or authorized representative who has indicated his/her understanding and acceptance.   Patient has DNR.  Discussed DNR with patient and Suspend DNR.   Dental advisory given  Plan Discussed with: CRNA  Anesthesia Plan Comments:         Anesthesia Quick Evaluation

## 2023-01-17 NOTE — TOC Initial Note (Signed)
Transition of Care [facility]) - Initial/Assessment Note    Patient Details  Name: Crystal [name] MRN: [id number] Date of Birth: [birth year]  [facility] CM/SW Contact:    Dessa Phi, RN Phone Number: 01/17/2023, 5:03 PM  Clinical Narrative: Spoke to [name](niece) about d/c plans-patient with limited support @ home.will f/u on [place], & will check on [facility] if can provide services, per [employer] rep Pam not in network she will send info t Options for iv abx infusion;also may need to explore ST SNF-will await more info.               Expected Discharge Plan: [place] Barriers to Discharge: Continued Medical Work up   Patient Goals and CMS Choice            Expected Discharge Plan and Services   Discharge Planning Services: CM Consult   Living arrangements for the past 2 months: Single Family Home                           HH Arranged: RN, PT, Nurse's Aide, IV Antibiotics HH Agency: Other - See comment ([facility]) Date HH Agency Contacted: 01/17/23 Time HH Agency Contacted: 1703 Representative spoke with at [place]:  ([facility] rep-will check w/option for iv abx)  Prior Living Arrangements/Services Living arrangements for the past 2 months: [place] with:: Self Patient language and need for interpreter reviewed:: Yes Do you feel safe going back to the place where you live?: Yes      Need for Family Participation in Patient Care: Yes (Comment) Care giver support system in place?: Yes (comment)   Criminal Activity/Legal Involvement Pertinent to Current Situation/Hospitalization: No - Comment as needed  Activities of Daily Living Home Assistive Devices/Equipment: Cane (specify quad or straight), Walker (specify type) ADL Screening (condition at time of admission) Patient's cognitive ability adequate to safely complete daily activities?: Yes Is the patient deaf or have difficulty hearing?:  No Does the patient have difficulty seeing, even when wearing glasses/contacts?: No Does the patient have difficulty concentrating, remembering, or making decisions?: No Patient able to express need for assistance with ADLs?: Yes Does the patient have difficulty dressing or bathing?: No Independently performs ADLs?: Yes (appropriate for developmental age) Does the patient have difficulty walking or climbing stairs?: Yes Weakness of Legs: Both Weakness of Arms/Hands: None  Permission Sought/Granted Permission sought to share information with : Case Manager Permission granted to share information with : Yes, Verbal Permission Granted  Share Information with NAME:  (Case Manager)           Emotional Assessment Appearance:: Appears stated age Attitude/Demeanor/Rapport: Gracious Affect (typically observed): Accepting Orientation: : Oriented to Self, Oriented to Place, Oriented to  Time, Oriented to Situation Alcohol / Substance Use: Not Applicable    Admission diagnosis:  Pleural effusion, malignant [J91.0] Dyspnea, unspecified type [R06.00] Patient Active Problem List   Diagnosis Date Noted   MSSA bacteremia 01/15/2023   Bloodstream infection due to Port-A-Cath 01/15/2023   Pleural effusion, malignant 01/13/2023   Neutropenia ([place]) 12/08/2022   Cellulitis 12/08/2022   Recurrent right pleural effusion 11/16/2022   Acute pulmonary embolism ([place]) 11/12/2022   Acute hypoxemic respiratory failure ([place]) 11/12/2022   Metastatic non-small cell lung cancer ([place]) 11/12/2022   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   Rib fracture 11/12/2022   Anxiety 11/12/2022   CKD (chronic kidney disease), stage III (  [place]) 11/12/2022   Chronic HFrEF (heart failure with reduced ejection fraction) ([place]) 11/12/2022   Metastasis to spinal column ([employer]) 10/03/2022   Primary cancer of right middle lobe of lung ([place]) 09/29/2022   Intracranial bleed ([place]) 09/13/2022   Lung mass 09/13/2022   Hypokalemia  09/13/2022   Tachycardia 08/29/2021   Preop cardiovascular exam 08/29/2020   Symptomatic cholelithiasis 06/09/2020   S/P laparoscopic cholecystectomy 06/09/2020   Chest pain with moderate risk for cardiac etiology 02/14/2018   Right upper quadrant pain 02/12/2018   Tremor 02/11/2014   Non Hodgkin's lymphoma ([place]) 08/18/2013   Neck pain on left side 05/30/2013   Dizziness 05/30/2013   Moderate obesity 05/21/2013   Lung crackles 05/21/2013   Dyspnea on exertion 05/21/2013   Nonischemic cardiomyopathy ([place])    Essential hypertension    Dyslipidemia, goal LDL below 100    PCP:  Gaynelle Arabian, MD Pharmacy:   [facility] [place], [address] [place] [address] Phone: [phone] Fax: [phone]     Social Determinants of Health ([place]) Social History: SDOH Screenings   Food Insecurity: No Food Insecurity (01/13/2023)  Housing: Low Risk  (01/13/2023)  Transportation Needs: No Transportation Needs (01/13/2023)  Utilities: Not At Risk (01/13/2023)  Tobacco Use: Low Risk  (01/16/2023)   SDOH Interventions:     Readmission Risk Interventions     No data to display

## 2023-01-17 NOTE — Progress Notes (Signed)
PHARMACY CONSULT NOTE FOR:  OUTPATIENT  PARENTERAL ANTIBIOTIC THERAPY (OPAT)  Indication: MSSA bacteremia Regimen: Cefazolin 2g IV every 8 hours End date: 02/12/23 (4 weeks from [facility] removal 3/25)  IV antibiotic discharge orders are pended. To discharging provider:  please sign these orders via discharge navigator,  Select New Orders & click on the button choice - Manage This Unsigned Work.     Thank you for allowing pharmacy to be a part of this patient's care.  Alycia Rossetti, PharmD, BCPS Infectious Diseases Clinical Pharmacist 01/18/2023 10:07 AM   **Pharmacist phone directory can now be found on [email or web address] ([email or web address]).  Listed under [place].

## 2023-01-17 NOTE — CV Procedure (Signed)
     Transesophageal Echocardiogram Note  RUPINDER VICKNAIR [id number] 1946-03-16  Procedure: Transesophageal Echocardiogram Indications: MSSA bacteremia  Procedure Details Consent: Obtained Time Out: Verified patient identification, verified procedure, site/side was marked, verified correct patient position, special equipment/implants available, Radiology Safety Procedures followed,  medications/allergies/relevent history reviewed, required imaging and test results available.  Performed  Medications: Propofol: 170mg   Left Ventrical:  LVEF 45%  Mitral Valve: Normal structure, trivial MR  Aortic Valve: Tricuspid, No AS/AR  Tricuspid Valve: Normal structure, trivial TR  Pulmonic Valve: Normal structure, trivial PR  Left Atrium/ Left atrial appendage: No LAA thrombus  Atrial septum: Normal structure  Aorta: Grade III plaque  No valvular vegetations visualized   Complications: No apparent complications Patient did tolerate procedure well.  Freada Bergeron, MD 01/17/2023, 12:32 PM

## 2023-01-17 NOTE — Transfer of Care (Signed)
Immediate Anesthesia Transfer of Care Note  Patient: Crystal [name]  Procedure(s) Performed: TRANSESOPHAGEAL ECHOCARDIOGRAM (TEE)  Patient Location: Endoscopy Unit  Anesthesia Type:MAC  Level of Consciousness: drowsy  Airway & Oxygen Therapy: Patient Spontanous Breathing  Post-op Assessment: Report given to RN and Post -op Vital signs reviewed and stable  Post vital signs: Reviewed and stable  Last Vitals:  Vitals Value Taken Time  BP    Temp    Pulse    Resp    SpO2      Last Pain:  Vitals:   01/17/23 1204  TempSrc:   PainSc: 0-No pain      Patients Stated Pain Goal: 1 ([id number])  Complications: No notable events documented.

## 2023-01-17 NOTE — Anesthesia Postprocedure Evaluation (Signed)
Anesthesia Post Note  Patient: Crystal [name]  Procedure(s) Performed: TRANSESOPHAGEAL ECHOCARDIOGRAM (TEE)     Patient location during evaluation: PACU Anesthesia Type: MAC Level of consciousness: awake and alert Pain management: pain level controlled Vital Signs Assessment: post-procedure vital signs reviewed and stable Respiratory status: spontaneous breathing Cardiovascular status: stable Anesthetic complications: no   No notable events documented.  Last Vitals:  Vitals:   01/17/23 1310 01/17/23 1355  BP: (!) 160/85 (!) 164/96  Pulse: 98 100  Resp: (!) 24 20  Temp:  36.7 C  SpO2: 91% 93%    Last Pain:  Vitals:   01/17/23 1355  TempSrc: Oral  PainSc:                  Nolon Nations

## 2023-01-17 NOTE — Progress Notes (Signed)
Patient transported to [facility] for TEE via [facility].

## 2023-01-17 NOTE — Progress Notes (Signed)
MD updated Telemetry Patient with a 12 beat Ventricular Rhythm. VSS stable and no acute changes noted in Patient's assessment. Patient denies any complaints of chest pain or any other concerns
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
## 2023-01-17 NOTE — TOC Initial Note (Signed)
Transition of Care [facility]) - Initial/Assessment Note    Patient Details  Name: Crystal [name] MRN: [id number] Date of Birth: 19-Dec-1945  Transition of Care [facility]) CM/SW Contact:    Dessa Phi, RN Phone Number: 01/17/2023, 2:34 PM  Clinical Narrative: lSpoke to [name](niece) who will be the contact person for [facility] HH rep Kasi HHRN/PT/aide/[employer] rep Pam iv infusion initial teaching.  Has own transport home.              Expected Discharge Plan: [place] Barriers to Discharge: Continued Medical Work up   Patient Goals and CMS Choice            Expected Discharge Plan and Services   Discharge Planning Services: CM Consult   Living arrangements for the past 2 months: [place]                                      Prior Living Arrangements/Services Living arrangements for the past 2 months: Single Family Home Lives with:: Adult Children Patient language and need for interpreter reviewed:: Yes Do you feel safe going back to the place where you live?: Yes      Need for Family Participation in Patient Care: Yes (Comment) Care giver support system in place?: Yes (comment)   Criminal Activity/Legal Involvement Pertinent to Current Situation/Hospitalization: No - Comment as needed  Activities of Daily Living Home Assistive Devices/Equipment: Cane (specify quad or straight), Walker (specify type) ADL Screening (condition at time of admission) Patient's cognitive ability adequate to safely complete daily activities?: Yes Is the patient deaf or have difficulty hearing?: No Does the patient have difficulty seeing, even when wearing glasses/contacts?: No Does the patient have difficulty concentrating, remembering, or making decisions?: No Patient able to express need for assistance with ADLs?: Yes Does the patient have difficulty dressing or bathing?: No Independently performs ADLs?: Yes (appropriate for developmental age) Does the  patient have difficulty walking or climbing stairs?: Yes Weakness of Legs: Both Weakness of Arms/Hands: None  Permission Sought/Granted Permission sought to share information with : Case Manager Permission granted to share information with : Yes, Verbal Permission Granted  Share Information with NAME:  (Case Manager)           Emotional Assessment Appearance:: Appears stated age Attitude/Demeanor/Rapport: Gracious Affect (typically observed): Accepting Orientation: : Oriented to Self, Oriented to Place, Oriented to  Time, Oriented to Situation Alcohol / Substance Use: Not Applicable    Admission diagnosis:  Pleural effusion, malignant [J91.0] Dyspnea, unspecified type [R06.00] Patient Active Problem List   Diagnosis Date Noted   MSSA bacteremia 01/15/2023   Bloodstream infection due to Port-A-Cath 01/15/2023   Pleural effusion, malignant 01/13/2023   Neutropenia ([place]) 12/08/2022   Cellulitis 12/08/2022   Recurrent right pleural effusion 11/16/2022   Acute pulmonary embolism ([place]) 11/12/2022   Acute hypoxemic respiratory failure ([place]) 11/12/2022   Metastatic non-small cell lung cancer ([place]) 11/12/2022   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   Rib fracture 11/12/2022   Anxiety 11/12/2022   CKD (chronic kidney disease), stage III ([place]) 11/12/2022   Chronic HFrEF (heart failure with reduced ejection fraction) ([place]) 11/12/2022   Metastasis to spinal column ([place]) 10/03/2022   Primary cancer of right middle lobe of lung ([place]) 09/29/2022   Intracranial bleed ([place]) 09/13/2022   Lung mass 09/13/2022   Hypokalemia 09/13/2022   Tachycardia 08/29/2021   Preop cardiovascular exam 08/29/2020  Symptomatic cholelithiasis 06/09/2020   S/P laparoscopic cholecystectomy 06/09/2020   Chest pain with moderate risk for cardiac etiology 02/14/2018   Right upper quadrant pain 02/12/2018   Tremor 02/11/2014   Non Hodgkin's lymphoma ([place]) 08/18/2013   Neck pain on left side 05/30/2013    Dizziness 05/30/2013   Moderate obesity 05/21/2013   Lung crackles 05/21/2013   Dyspnea on exertion 05/21/2013   Nonischemic cardiomyopathy ([place])    Essential hypertension    Dyslipidemia, goal LDL below 100    PCP:  Gaynelle Arabian, MD Pharmacy:   [facility] [place], [place] [address] Phone: [phone] Fax: [phone]     Social Determinants of Health ([place]) Social History: SDOH Screenings   Food Insecurity: No Food Insecurity (01/13/2023)  Housing: Low Risk  (01/13/2023)  Transportation Needs: No Transportation Needs (01/13/2023)  Utilities: Not At Risk (01/13/2023)  Tobacco Use: Low Risk  (01/16/2023)   SDOH Interventions:     Readmission Risk Interventions     No data to display

## 2023-01-17 NOTE — Progress Notes (Signed)
Mobility Specialist - Progress Note   01/17/23 1001  Oxygen Therapy  SpO2 (!) 88 %  O2 Device Room Air  Patient Activity (if Appropriate) Ambulating  Mobility  Activity Ambulated with assistance in hallway  Level of Assistance Standby assist, set-up cues, supervision of patient - no hands on  Assistive Device Front wheel walker  Distance Ambulated (ft) 20 ft  Activity Response Tolerated well  Mobility Referral Yes  $Mobility charge 1 Mobility   Pt received in bed and agreeable to mobility. Prior to ambulating, pt c/o feeling nervous due to procedure today. Once in hallway pt became SOB. Suggested pt take a seat. O2 & HR checked & recorded below. Nurse aware of occurrence in hallway. No other complaints during session. Pt to bed after session w/ all needs met.    During mobility: 136 HR, 88% SpO2   Set designer

## 2023-01-18 DIAGNOSIS — N1831 Chronic kidney disease, stage 3a: Secondary | ICD-10-CM

## 2023-01-18 DIAGNOSIS — C3490 Malignant neoplasm of unspecified part of unspecified bronchus or lung: Secondary | ICD-10-CM | POA: Diagnosis not present

## 2023-01-18 DIAGNOSIS — B9561 Methicillin susceptible Staphylococcus aureus infection as the cause of diseases classified elsewhere: Secondary | ICD-10-CM | POA: Diagnosis not present

## 2023-01-18 DIAGNOSIS — T80211D Bloodstream infection due to central venous catheter, subsequent encounter: Secondary | ICD-10-CM

## 2023-01-18 DIAGNOSIS — R7881 Bacteremia: Secondary | ICD-10-CM | POA: Diagnosis not present

## 2023-01-18 LAB — CBC
HCT: 28.5 % — ABNORMAL LOW (ref 36.0–46.0)
Hemoglobin: 9.1 g/dL — ABNORMAL LOW (ref 12.0–15.0)
MCH: 30 pg (ref 26.0–34.0)
MCHC: 31.9 g/dL (ref 30.0–36.0)
MCV: 94.1 fL (ref 80.0–100.0)
Platelets: 138 10*3/uL — ABNORMAL LOW (ref 150–400)
RBC: 3.03 MIL/uL — ABNORMAL LOW (ref 3.87–5.11)
RDW: 17.5 % — ABNORMAL HIGH (ref 11.5–15.5)
WBC: 14.1 10*3/uL — ABNORMAL HIGH (ref 4.0–10.5)
nRBC: 0 % (ref 0.0–0.2)

## 2023-01-18 LAB — COMPREHENSIVE METABOLIC PANEL
ALT: <5 U/L (ref 0–44)
AST: 14 U/L — ABNORMAL LOW (ref 15–41)
Albumin: 1.6 g/dL — ABNORMAL LOW (ref 3.5–5.0)
Alkaline Phosphatase: 76 U/L (ref 38–126)
Anion gap: 6 (ref 5–15)
BUN: 15 mg/dL (ref 8–23)
CO2: 28 mmol/L (ref 22–32)
Calcium: 7.2 mg/dL — ABNORMAL LOW (ref 8.9–10.3)
Chloride: 103 mmol/L (ref 98–111)
Creatinine, Ser: 1.25 mg/dL — ABNORMAL HIGH (ref 0.44–1.00)
GFR, Estimated: 44 mL/min — ABNORMAL LOW (ref >60)
Glucose, Bld: 89 mg/dL (ref 70–99)
Potassium: 3.1 mmol/L — ABNORMAL LOW (ref 3.5–5.1)
Sodium: 137 mmol/L (ref 135–145)
Total Bilirubin: 0.3 mg/dL (ref 0.3–1.2)
Total Protein: 5.5 g/dL — ABNORMAL LOW (ref 6.5–8.1)

## 2023-01-18 LAB — MAGNESIUM: Magnesium: 1.7 mg/dL (ref 1.7–2.4)

## 2023-01-18 MED ORDER — CEFAZOLIN IV (FOR PTA / DISCHARGE USE ONLY): 2.0000 g | Freq: Three times a day (TID) | INTRAVENOUS | 0 refills | Status: AC

## 2023-01-18 MED ORDER — MAGNESIUM SULFATE 2 GM/50ML IV SOLN
2.0000 g | Freq: Once | INTRAVENOUS | Status: AC
  Administered 2023-01-18: 2 g via INTRAVENOUS
  Filled 2023-01-18: qty 50

## 2023-01-18 MED ORDER — POTASSIUM CHLORIDE CRYS ER 20 MEQ PO TBCR
40.0000 meq | EXTENDED_RELEASE_TABLET | ORAL | Status: AC
  Administered 2023-01-18 (×2): 40 meq via ORAL
  Filled 2023-01-18 (×2): qty 2

## 2023-01-18 NOTE — TOC Progression Note (Signed)
Transition of Care [facility]) - Progression Note    Patient Details  Name: Crystal [name] MRN: [id number] Date of Birth: [birth year]  Transition of Care [facility]) CM/SW Contact  Rika Daughdrill, Juliann Pulse, RN Phone Number: 01/18/2023, 9:48 AM  Clinical Narrative: Received call from [facility](DME) for iv infusion rep Danesha-requiring additional info for services-she will confirm insurance, & cost-will fax PICC report placement,HHC orders including flushes,labs,meds to fax#[phone],tel#[phone]. Will contact [facility] per family request.Will still eval for most effective d/c plan-HH vs ST SNF.      Expected Discharge Plan: [place] Barriers to Discharge: Continued Medical Work up  Expected Discharge Plan and Services   Discharge Planning Services: CM Consult   Living arrangements for the past 2 months: Single Family Home                           HH Arranged: RN, PT, Nurse's Aide, IV Antibiotics HH Agency: Other - See comment ([facility]) Date HH Agency Contacted: 01/17/23 Time [place]: 1703 Representative spoke with at [place]:  ([place] rep-will check w/[facility] for iv abx)   Social Determinants of Health (SDOH) Interventions SDOH Screenings   Food Insecurity: No Food Insecurity (01/13/2023)  Housing: Low Risk  (01/13/2023)  Transportation Needs: No Transportation Needs (01/13/2023)  Utilities: Not At Risk (01/13/2023)  Tobacco Use: Low Risk  (01/16/2023)    Readmission Risk Interventions     No data to display

## 2023-01-18 NOTE — TOC Progression Note (Signed)
Transition of Care [facility]) - Progression Note    Patient Details  Name: Crystal [name] MRN: [id number] Date of Birth: 03-May-1946  Transition of Care [facility]) CM/SW Contact  Yisel Megill, Juliann Pulse, RN Phone Number: 01/18/2023, 3:42 PM  Clinical Narrative: d/c home tomorrow to patient's home address on face sheet. [facility] for iv abx-faxed orders, iv abx order,picc flush order to fax#[phone];Danesha tel#[phone]-she will discuss home abx w/[name](niece)-[name] is the contact person for all arrangements for home. [facility] rep Kasi accepted for HHRN/PT/OT/aide;[facility]ria for home 02 rep Jeneen Rinks to deliver home 02 travel tank to rm prior d/c. Family has own transport home.     Expected Discharge Plan: [place] Barriers to Discharge: Continued Medical Work up  Expected Discharge Plan and Services   Discharge Planning Services: CM Consult   Living arrangements for the past 2 months: Single Family Home                 DME Arranged: Oxygen DME Agency: [place] Date DME Agency Contacted: 01/18/23 Time DME Agency Contacted: [id number] Representative spoke with at DME Agency: Jeneen Rinks HH Arranged: OT, IV Antibiotics [place] Agency: Other - See comment ([facility]) Date HH Agency Contacted: 01/17/23 Time [place]: 1703 Representative spoke with at [place]:  ([place] rep-will check w/option for iv abx)   Social Determinants of Health (SDOH) Interventions SDOH Screenings   Food Insecurity: No Food Insecurity (01/13/2023)  Housing: Low Risk  (01/13/2023)  Transportation Needs: No Transportation Needs (01/13/2023)  Utilities: Not At Risk (01/13/2023)  Tobacco Use: Low Risk  (01/16/2023)    Readmission Risk Interventions     No data to display

## 2023-01-18 NOTE — Progress Notes (Signed)
[facility]ious Disease  Date of Admission:  01/12/2023           Reason for visit: Follow up on MSSA bacteremia  Current antibiotics: Cefazolin  ASSESSMENT:    77 y.o. female admitted with:  MSSA bacteremia: Patient admitted 01/12/23 found to have MSSA bacteremia in setting of Port for chemotherapy purposes.  Blood cx positive from 01/12/23 and currently on cefazolin.  Port-A-Cath removed 01/15/23.  They were unable to establish PIV access so she had to have a PICC line placed at time of PAC removal as well so unfortunately unable to have true line holiday.  Repeat blood cultures are NGTD.  TEE was negative for endocarditis. Metastatic non-small cell lung cancer with malignant pleural effusion: Currently on chemotherapy and followed by oncology.  Status post Pleurx catheter drainage and removal during this admission on 3/23. CKD3: Stable at baseline.  RECOMMENDATIONS:    Will continue cefazolin for 4 weeks total in setting of immune compromise and MSSA bacteremia from suspected line infection See OPAT below Defer timing of chemotherapy to oncology  Diagnosis: MSSA bacteremia   Allergies  Allergen Reactions   Simvastatin Other (See Comments)    Leg cramps   Zoloft [Sertraline] Nausea Only   Codeine Other (See Comments)    Bad Headaches   Coreg Other (See Comments)    "Made my legs hurt"   Lisinopril Cough   Tizanidine Hcl Rash and Other (See Comments)    Hypotension, also    OPAT Orders Discharge antibiotics to be given via PICC line Discharge antibiotics: Per pharmacy protocol Cefazolin  Duration: 4 weeks  End Date: 02/12/23  [facility] Per Protocol:  Home health RN for IV administration and teaching; PICC line care and labs.    Labs weekly while on IV antibiotics: _xx_ CBC with differential _xx_ BMP __ CMP __ CRP __ ESR __ Vancomycin trough __ CK  __ Please pull PIC at completion of IV antibiotics _xx_ Please leave PIC in place until  doctor has seen patient or been notified  Fax weekly labs to [phone]  Clinic Follow Up Appt: 01/31/23 at 230pm with Crucita Lacorte     Principal Problem:   MSSA bacteremia Active Problems:   Essential hypertension   Dyslipidemia, goal LDL below 100   Non Hodgkin's lymphoma ([place])   Lung mass   Metastatic non-small cell lung cancer (HCC)   Malignant pleural effusion   CKD (chronic kidney disease), stage III (HCC)   Chronic HFrEF (heart failure with reduced ejection fraction) (HCC)   Pleural effusion, malignant   Bloodstream infection due to Port-A-Cath    MEDICATIONS:    Scheduled Meds:  apixaban  5 mg Oral BID   Chlorhexidine Gluconate Cloth  6 each Topical Daily   folic acid  1 mg Oral Daily   metoprolol tartrate  100 mg Oral BID   oxyCODONE  5 mg Oral QHS   pantoprazole  40 mg Oral BID AC   polyethylene glycol  17 g Oral Daily   Continuous Infusions:   ceFAZolin (ANCEF) IV 2 g (01/18/23 0628)   PRN Meds:.acetaminophen **OR** acetaminophen, LORazepam, ondansetron **OR** ondansetron (ZOFRAN) IV, mouth rinse, oxyCODONE, senna-docusate, sodium chloride flush, sodium chloride flush  SUBJECTIVE:   24 hour events:  No acute events TEE negative Ready to go home No new complaints    Review of Systems  All other systems reviewed and are negative.     OBJECTIVE:   Blood pressure (!) 141/81,  pulse (!) 110, temperature 98.1 F (36.7 C), temperature source Oral, resp. rate 20, height 5\' 5"  (1.651 m), weight 94 kg, SpO2 97 %. Body mass index is 34.49 kg/m.  Physical Exam Constitutional:      Appearance: Normal appearance.  Eyes:     Extraocular Movements: Extraocular movements intact.     Conjunctiva/sclera: Conjunctivae normal.  Pulmonary:     Effort: Pulmonary effort is normal. No respiratory distress.  Musculoskeletal:     Cervical back: Normal range of motion and neck supple.  Skin:    General: Skin is warm and dry.  Neurological:     General: No  focal deficit present.     Mental Status: She is alert and oriented to person, place, and time.  Psychiatric:        Mood and Affect: Mood normal.        Behavior: Behavior normal.      Lab Results: Lab Results  Component Value Date   WBC 14.1 (H) 01/18/2023   HGB 9.1 (L) 01/18/2023   HCT 28.5 (L) 01/18/2023   MCV 94.1 01/18/2023   PLT 138 (L) 01/18/2023    Lab Results  Component Value Date   NA 137 01/18/2023   K 3.1 (L) 01/18/2023   CO2 28 01/18/2023   GLUCOSE 89 01/18/2023   BUN 15 01/18/2023   CREATININE 1.25 (H) 01/18/2023   CALCIUM 7.2 (L) 01/18/2023   GFRNONAA 44 (L) 01/18/2023   GFRAA 48 (L) 06/01/2020    Lab Results  Component Value Date   ALT <5 01/18/2023   AST 14 (L) 01/18/2023   ALKPHOS 76 01/18/2023   BILITOT 0.3 01/18/2023    No results found for: "CRP"     Component Value Date/Time   ESRSEDRATE 16 02/16/2014 1425     I have reviewed the micro and lab results in Epic.  Imaging: ECHO TEE  Result Date: 01/17/2023    TRANSESOPHOGEAL ECHO REPORT   Patient Name:   Crystal [name] Date of Exam: 01/17/2023 Medical Rec #:  [id number]       Height:       65.0 in Accession #:    [id number]      Weight:       207.2 lb Date of Birth:  1946-08-11        BSA:          2.008 m Patient Age:    84 years        BP:           178/100 mmHg Patient Gender: F               HR:           103 bpm. Exam Location:  Inpatient Procedure: Transesophageal Echo Indications:    Bacteremia  History:        Patient has prior history of Echocardiogram examinations.                 Cardiomyopathy; Signs/Symptoms:Bacteremia.  Sonographer:    Raquel Sarna Senior Referring Phys: [id number] Margie Billet PROCEDURE: The transesophogeal probe was passed without difficulty through the esophogus of the patient. Sedation performed by performing physician. The patient developed no complications during the procedure.  IMPRESSIONS  1. No valvular vegetations visualized.  2. Left ventricular ejection  fraction, by estimation, is 45 to 50%. The left ventricle has mildly decreased function.  3. Right ventricular systolic function is normal. The right ventricular size is normal.  4. No left atrial/left  atrial appendage thrombus was detected.  5. The mitral valve is normal in structure. Trivial mitral valve regurgitation.  6. The aortic valve is tricuspid. There is mild thickening of the aortic valve. Aortic valve regurgitation is not visualized. Aortic valve sclerosis is present, with no evidence of aortic valve stenosis.  7. Aortic dilatation noted. There is borderline dilatation of the aortic root, measuring 39 mm. There is Moderate (Grade III) plaque. FINDINGS  Left Ventricle: Left ventricular ejection fraction, by estimation, is 45 to 50%. The left ventricle has mildly decreased function. The left ventricular internal cavity size was normal in size. Right Ventricle: The right ventricular size is normal. No increase in right ventricular wall thickness. Right ventricular systolic function is normal. Left Atrium: Left atrial size was normal in size. No left atrial/left atrial appendage thrombus was detected. Right Atrium: Right atrial size was normal in size. Pericardium: There is no evidence of pericardial effusion. Mitral Valve: The mitral valve is normal in structure. Trivial mitral valve regurgitation. Tricuspid Valve: The tricuspid valve is normal in structure. Tricuspid valve regurgitation is trivial. Aortic Valve: The aortic valve is tricuspid. There is mild thickening of the aortic valve. Aortic valve regurgitation is not visualized. Aortic valve sclerosis is present, with no evidence of aortic valve stenosis. Pulmonic Valve: The pulmonic valve was normal in structure. Pulmonic valve regurgitation is trivial. Aorta: Aortic dilatation noted. There is borderline dilatation of the aortic root, measuring 39 mm. There is moderate (Grade III) plaque. IAS/Shunts: The atrial septum is grossly normal.  LEFT  VENTRICLE PLAX 2D LVIDd:         3.90 cm LVIDs:         3.00 cm LV PW:         0.50 cm  Gwyndolyn Kaufman MD Electronically signed by Gwyndolyn Kaufman MD Signature Date/Time: 01/17/2023/2:18:15 PM    Final      Imaging  independently reviewed in Epic.    Raynelle [facility] for [facility] [phone] pager 01/18/2023, 10:58 AM

## 2023-01-18 NOTE — Progress Notes (Signed)
SATURATION QUALIFICATIONS: (This note is used to comply with regulatory documentation for home oxygen)  Patient Saturations on Room Air at Rest = 91%  Patient Saturations on Room Air while Ambulating = 85%  Patient Saturations on 2 Liters of oxygen while Ambulating = 98%  Please briefly explain why patient needs home oxygen: Patient de-sats on room air when ambulatory and requires 2 liters of oxygen to recover. Chadrick Sprinkle, Laurel Dimmer, RN
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
## 2023-01-18 NOTE — Plan of Care (Signed)
  Problem: Education: Goal: Knowledge of General Education information will improve Description: Including pain rating scale, medication(s)/side effects and non-pharmacologic comfort measures Outcome: Completed/Met   Problem: Activity: Goal: Risk for activity intolerance will decrease Outcome: Completed/Met   Problem: Pain Managment: Goal: General experience of comfort will improve Outcome: Completed/Met   
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
## 2023-01-19 ENCOUNTER — Encounter: Payer: Self-pay | Admitting: *Deleted

## 2023-01-19 DIAGNOSIS — B9561 Methicillin susceptible Staphylococcus aureus infection as the cause of diseases classified elsewhere: Secondary | ICD-10-CM | POA: Diagnosis not present

## 2023-01-19 DIAGNOSIS — J9601 Acute respiratory failure with hypoxia: Secondary | ICD-10-CM | POA: Diagnosis not present

## 2023-01-19 DIAGNOSIS — J910 Malignant pleural effusion: Secondary | ICD-10-CM | POA: Diagnosis not present

## 2023-01-19 DIAGNOSIS — R7881 Bacteremia: Secondary | ICD-10-CM | POA: Diagnosis not present

## 2023-01-19 LAB — BASIC METABOLIC PANEL
Anion gap: 6 (ref 5–15)
BUN: 14 mg/dL (ref 8–23)
CO2: 26 mmol/L (ref 22–32)
Calcium: 7.2 mg/dL — ABNORMAL LOW (ref 8.9–10.3)
Chloride: 102 mmol/L (ref 98–111)
Creatinine, Ser: 1.24 mg/dL — ABNORMAL HIGH (ref 0.44–1.00)
GFR, Estimated: 45 mL/min — ABNORMAL LOW (ref >60)
Glucose, Bld: 90 mg/dL (ref 70–99)
Potassium: 3 mmol/L — ABNORMAL LOW (ref 3.5–5.1)
Sodium: 134 mmol/L — ABNORMAL LOW (ref 135–145)

## 2023-01-19 LAB — CULTURE, BLOOD (ROUTINE X 2)
Culture: NO GROWTH
Culture: NO GROWTH
Special Requests: ADEQUATE

## 2023-01-19 MED ORDER — POTASSIUM CHLORIDE CRYS ER 10 MEQ PO TBCR: 30.0000 meq | EXTENDED_RELEASE_TABLET | Freq: Every day | ORAL | 0 refills | Status: DC

## 2023-01-19 MED ORDER — POTASSIUM CHLORIDE CRYS ER 20 MEQ PO TBCR
40.0000 meq | EXTENDED_RELEASE_TABLET | ORAL | Status: AC
  Administered 2023-01-19 (×2): 40 meq via ORAL
  Filled 2023-01-19 (×2): qty 2

## 2023-01-19 MED ORDER — HEPARIN SOD (PORK) LOCK FLUSH 100 UNIT/ML IV SOLN
250.0000 [IU] | INTRAVENOUS | Status: AC | PRN
  Administered 2023-01-19: 250 [IU]
  Filled 2023-01-19: qty 2.5

## 2023-01-19 NOTE — TOC Transition Note (Signed)
Transition of Care [facility]) - CM/SW Discharge Note   Patient Details  Name: Crystal [name] MRN: [id number] Date of Birth: [birth year]  [facility] CM/SW Contact:  Henrietta Dine, RN Phone Number: 01/19/2023, 3:06 PM   Clinical Narrative:    D?C orders received; confirmed oxygen delivered to room, orders for IV abx received, PICC dressing changes and flush per [facility]; pt transport home by family; [place] signing off.   Final next level of care: Home w Home Health Services Barriers to Discharge: No Barriers Identified   Patient Goals and CMS Choice CMS Medicare.gov Compare Post Acute Care list provided to:: Patient Represenative (must comment)    Discharge Placement                         Discharge Plan and Services Additional resources added to the After Visit Summary for     Discharge Planning Services: CM Consult            DME Arranged: Oxygen DME Agency: [facility] Date DME Agency Contacted: 01/18/23 Time DME Agency Contacted: [phone] Representative spoke with at DME Agency: Jeneen Rinks HH Arranged: OT, IV Antibiotics [place] Agency: Other - See comment (Options) Date HH Agency Contacted: 01/17/23 Time [place]: 1703 Representative spoke with at [facility]:  ([facility] rep-will check w/option for iv abx)  Social Determinants of Health (SDOH) Interventions SDOH Screenings   Food Insecurity: No Food Insecurity (01/13/2023)  Housing: Low Risk  (01/13/2023)  Transportation Needs: No Transportation Needs (01/13/2023)  Utilities: Not At Risk (01/13/2023)  Tobacco Use: Low Risk  (01/16/2023)     Readmission Risk Interventions     No data to display

## 2023-01-19 NOTE — Discharge Summary (Signed)
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 current examination and is resolved. Musculoskeletal: Scattered degenerative changes of the spine and pelvis. Dictation cement within the vertebral body at L2. Multilevel stenosis along the spine. Once again there is a fracture of the lateral aspect of the left ninth rib. IMPRESSION: No developing mass lesion, fluid collection or lymph node enlargement in the abdomen and pelvis. No signs of abscess. Small cystic lesion along the upper aspect of the spleen has been slowly increasing since 2021. Favor a benign process. Left ninth rib fracture lateral. Basilar lung opacities and pleural effusions. Tiny left effusion is slightly larger than the prior CT. The right effusion has some pleural thickening and may be complex. Few bubbles of air. Recommend follow-up and correlation to prior chest CT Electronically Signed   By: Jill Side M.D.   On: 01/15/2023 12:37   CT Chest W Contrast  Result Date: 01/12/2023 CLINICAL DATA:  Dyspnea, pleural  effusion.  Lung cancer. EXAM: CT CHEST WITH CONTRAST TECHNIQUE: Multidetector CT imaging of the chest was performed during intravenous contrast administration. RADIATION DOSE REDUCTION: This exam was performed according to the departmental dose-optimization program which includes automated exposure control, adjustment of the mA and/or kV according to patient size and/or use of iterative reconstruction technique. CONTRAST:  17mL OMNIPAQUE IOHEXOL 300 MG/ML  SOLN COMPARISON:  11/11/2022 FINDINGS: Cardiovascular: Mild coronary artery calcification. Global cardiac size within normal limits. No pericardial effusion. The central pulmonary arteries are mildly enlarged in keeping with changes of pulmonary arterial hypertension. Mild atherosclerotic calcification within the thoracic aorta. No aortic aneurysm. Right internal jugular chest port tip noted within the superior right atrium. Mediastinum/Nodes: Visualized thyroid is unremarkable. There is progressive shotty right paratracheal, right hilar, and subcarinal adenopathy which is nonspecific and may reflect progressive nodal metastatic disease versus reactive adenopathy. No frankly pathologic nodal enlargement. Index lymph node measures 13 mm in short axis diameter at axial image # 62/2, previously measuring 8 mm. The esophagus is unremarkable. Lungs/Pleura: Interval right tunneled pleural drainage catheter placement. Complex right pleural effusion has decreased in size since prior examination but now demonstrates increasing pleural thickening and enhancement as well as partial loculation. Small residual pleural fluid persists. No pneumothorax. There is volume loss within the right lung, asymmetrically involving the right middle and lower lobes. Pulmonary mass within the lateral segment of the right middle lobe is difficult to accurately measure given adjacent pleural fluid and atelectatic lung measuring roughly 2.3 x 3.6 cm at axial image # 71/2. Trace left pleural  effusion. Minimal left basilar dependent atelectasis. Upper Abdomen: No acute abnormality. Musculoskeletal: Remote nonunited left ninth rib fracture again noted. No acute bone abnormality. No lytic or blastic bone lesion. IMPRESSION: 1. Interval right tunneled pleural drainage catheter  placement. Complex right pleural effusion has decreased in size since prior examination but now demonstrates increasing pleural thickening and enhancement as well as partial loculation. Small residual pleural fluid persists. Basilar predominant right-sided volume loss noted. 2. Pulmonary mass within the lateral segment of the right middle lobe is difficult to accurately measure given adjacent pleural fluid and atelectatic lung measuring roughly 2.3 x 3.6 cm. 3. Progressive shotty right paratracheal, right hilar, and subcarinal adenopathy which is nonspecific and may reflect progressive nodal metastatic disease versus reactive adenopathy. 4. Mild coronary artery calcification. 5. Morphologic changes in keeping with pulmonary arterial hypertension. Aortic Atherosclerosis (ICD10-I70.0). Electronically Signed   By: Fidela Salisbury M.D.   On: 01/12/2023 21:09   DG Chest Port 1 View  Result Date: 01/12/2023 CLINICAL DATA:  sob EXAM: PORTABLE CHEST 1 VIEW COMPARISON:  Chest x-ray 12/12/2022 FINDINGS: Right chest wall Port-A-Cath with tip overlying the right atrium. The heart and mediastinal contours are unchanged. Aortic calcification. Persistent right mid lung zone peripheral linear airspace opacity that could represent infection/inflammation. Interval increase of right middle lung zone airspace opacity. No pulmonary edema. Loculated fluid within the middle fissure decreased in size. No pneumothorax. No acute osseous abnormality. IMPRESSION: 1. Persistent right mid lung zone peripheral linear airspace opacity that could represent infection/inflammation. Interval development of right middle lung zone airspace opacity. Loculated fluid  within the middle fissure decreased in size. Consider CT chest with intravenous contrast for further evaluation. 2.  Aortic Atherosclerosis (ICD10-I70.0). Electronically Signed   By: Iven Finn M.D.   On: 01/12/2023 18:33    Subjective: - no chest pain, shortness of breath, no abdominal pain, nausea or vomiting.   Discharge Exam: BP (!) 163/88 (BP Location: Right Leg)   Pulse 95   Temp 98 F (36.7 C) (Oral)   Resp 18   Ht 5\' 5"  (1.651 m)   Wt 94 kg   SpO2 94%   BMI 34.49 kg/m   General: Pt is alert, awake, not in acute distress Cardiovascular: RRR, S1/S2 +, no rubs, no gallops Respiratory: CTA bilaterally, no wheezing, no rhonchi Abdominal: Soft, NT, ND, bowel sounds + Extremities: no edema, no cyanosis    The results of significant diagnostics from this hospitalization (including imaging, microbiology, ancillary and laboratory) are listed below for reference.     Microbiology: Recent Results (from the past 240 hour(s))  Culture, blood (Routine X 2) w Reflex to ID Panel     Status: Abnormal   Collection Time: 01/12/23  9:25 PM   Specimen: BLOOD  Result Value Ref Range Status   Specimen Description   Final    BLOOD LEFT ANTECUBITAL Performed at [facility] [address], [place], [place] [address]    Special Requests   Final    BOTTLES DRAWN AEROBIC AND ANAEROBIC Blood Culture results may not be optimal due to an inadequate volume of blood received in culture bottles Performed at [facility] [address] [place], [place] [address]    Culture  Setup Time   Final    GRAM POSITIVE COCCI IN CLUSTERS AEROBIC BOTTLE ONLY CRITICAL VALUE NOTED.  VALUE IS CONSISTENT WITH PREVIOUSLY REPORTED AND CALLED VALUE.    Culture (A)  Final    STAPHYLOCOCCUS AUREUS SUSCEPTIBILITIES PERFORMED ON PREVIOUS CULTURE WITHIN THE LAST 5 DAYS. Performed at [facility], [place] [address], [place], [place] [address]    Report Status  01/15/2023 FINAL  Final  Culture, blood (Routine X 2) w Reflex to  ID Panel     Status: Abnormal   Collection Time: 01/12/23 10:20 PM   Specimen: BLOOD  Result Value Ref Range Status   Specimen Description   Final    BLOOD PORTA CATH Performed at [facility] [address] [place], [place] [address]    Special Requests   Final    BOTTLES DRAWN AEROBIC AND ANAEROBIC Blood Culture adequate volume Performed at [facility] [address], [place], [place] [address]    Culture  Setup Time   Final    ANAEROBIC BOTTLE ONLY GRAM POSITIVE COCCI IN CLUSTERS CRITICAL RESULT CALLED TO, READ BACK BY AND VERIFIED WITH: [place] 01/13/23 @ 2150 BY AB Performed at [facility], [place] [address], [place], [place] [address]    Culture STAPHYLOCOCCUS AUREUS (A)  Final   Report Status 01/15/2023 FINAL  Final   Organism ID, Bacteria STAPHYLOCOCCUS AUREUS  Final      Susceptibility   Staphylococcus aureus - MIC*    CIPROFLOXACIN <=0.5 SENSITIVE Sensitive     ERYTHROMYCIN >=8 RESISTANT Resistant     GENTAMICIN <=0.5 SENSITIVE Sensitive     OXACILLIN 1 SENSITIVE Sensitive     TETRACYCLINE <=1 SENSITIVE Sensitive     VANCOMYCIN 1 SENSITIVE Sensitive     TRIMETH/SULFA <=10 SENSITIVE Sensitive     CLINDAMYCIN <=0.25 SENSITIVE Sensitive     RIFAMPIN <=0.5 SENSITIVE Sensitive     Inducible Clindamycin NEGATIVE Sensitive     * STAPHYLOCOCCUS AUREUS  Blood Culture ID Panel (Reflexed)     Status: Abnormal   Collection Time: 01/12/23 10:20 PM  Result Value Ref Range Status   Enterococcus faecalis NOT DETECTED NOT DETECTED Final   Enterococcus Faecium NOT DETECTED NOT DETECTED Final   Listeria monocytogenes NOT DETECTED NOT DETECTED Final   Staphylococcus species DETECTED (A) NOT DETECTED Final    Comment: CRITICAL RESULT CALLED TO, READ BACK BY AND VERIFIED WITH: PHARMD E. JACKSON 01/13/23 @ 2150 BY AB    Staphylococcus aureus (BCID) DETECTED (A) NOT  DETECTED Final    Comment: CRITICAL RESULT CALLED TO, READ BACK BY AND VERIFIED WITH: PHARMD E. JACKSON 01/13/23 @ 2150 BY AB    Staphylococcus epidermidis NOT DETECTED NOT DETECTED Final   Staphylococcus lugdunensis NOT DETECTED NOT DETECTED Final   Streptococcus species NOT DETECTED NOT DETECTED Final   Streptococcus agalactiae NOT DETECTED NOT DETECTED Final   Streptococcus pneumoniae NOT DETECTED NOT DETECTED Final   Streptococcus pyogenes NOT DETECTED NOT DETECTED Final   A.calcoaceticus-baumannii NOT DETECTED NOT DETECTED Final   Bacteroides fragilis NOT DETECTED NOT DETECTED Final   Enterobacterales NOT DETECTED NOT DETECTED Final   Enterobacter cloacae complex NOT DETECTED NOT DETECTED Final   Escherichia coli NOT DETECTED NOT DETECTED Final   Klebsiella aerogenes NOT DETECTED NOT DETECTED Final   Klebsiella oxytoca NOT DETECTED NOT DETECTED Final   Klebsiella pneumoniae NOT DETECTED NOT DETECTED Final   Proteus species NOT DETECTED NOT DETECTED Final   Salmonella species NOT DETECTED NOT DETECTED Final   Serratia marcescens NOT DETECTED NOT DETECTED Final   Haemophilus influenzae NOT DETECTED NOT DETECTED Final   Neisseria meningitidis NOT DETECTED NOT DETECTED Final   Pseudomonas aeruginosa NOT DETECTED NOT DETECTED Final   Stenotrophomonas maltophilia NOT DETECTED NOT DETECTED Final   Candida albicans NOT DETECTED NOT DETECTED Final   Candida auris NOT DETECTED NOT DETECTED Final   Candida glabrata NOT DETECTED NOT DETECTED Final   Candida krusei NOT DETECTED NOT  DETECTED Final   Candida parapsilosis NOT DETECTED NOT DETECTED Final   Candida tropicalis NOT DETECTED NOT DETECTED Final   Cryptococcus neoformans/gattii NOT DETECTED NOT DETECTED Final   Meth resistant mecA/C and MREJ NOT DETECTED NOT DETECTED Final    Comment: Performed at [facility], [place] [address], [place], [place] [address]  Culture, blood (Routine X 2) w Reflex to ID Panel     Status: None    Collection Time: 01/14/23  4:21 PM   Specimen: BLOOD  Result Value Ref Range Status   Specimen Description   Final    BLOOD BLOOD LEFT ARM AEROBIC BOTTLE ONLY Performed at [facility] [address], [place], [place] [address]    Special Requests   Final    BOTTLES DRAWN AEROBIC ONLY Blood Culture adequate volume Performed at [facility] [address], [place], [place] [address]    Culture   Final    NO GROWTH 5 DAYS Performed at [facility], [place] [address], [place], [place] [address]    Report Status 01/19/2023 FINAL  Final  Culture, blood (Routine X 2) w Reflex to ID Panel     Status: None   Collection Time: 01/14/23  4:21 PM   Specimen: BLOOD  Result Value Ref Range Status   Specimen Description   Final    BLOOD BLOOD RIGHT HAND AEROBIC BOTTLE ONLY Performed at [facility] [address] [place], [place] [address]    Special Requests   Final    BOTTLES DRAWN AEROBIC ONLY Blood Culture results may not be optimal due to an inadequate volume of blood received in culture bottles Performed at [facility] [address], [place], [place] [address]    Culture   Final    NO GROWTH 5 DAYS Performed at [facility], [place] [address] [place], [place] [address]    Report Status 01/19/2023 FINAL  Final     Labs: Basic Metabolic Panel: Recent Labs  Lab 01/14/23 0235 01/15/23 0240 01/16/23 0259 01/17/23 0409 01/18/23 0310 01/19/23 0406  NA 139 139 137 137 137 134*  K 2.4* 2.9* 3.3* 3.1* 3.1* 3.0*  CL 101 101 103 103 103 102  CO2 28 27 26 27 28 26   GLUCOSE 114* 101* 99 89 89 90  BUN 14 14 14 15 15 14   CREATININE 1.39* 1.39* 1.20* 1.26* 1.25* 1.24*  CALCIUM 7.2* 7.0* 7.2* 7.2* 7.2* 7.2*  MG 1.3* 1.3* 2.0  --  1.7  --    Liver Function Tests: Recent Labs  Lab 01/14/23 0235 01/15/23 0240 01/16/23 0259 01/17/23 0409 01/18/23 0310  AST 14* 17 13* 13* 14*  ALT 13 8 <5 <5 <5   ALKPHOS 103 105 91 84 76  BILITOT 0.5 0.4 0.4 0.5 0.3  PROT 5.7* 5.6* 5.4* 5.7* 5.5*  ALBUMIN 1.8* 1.9* 1.6* 1.6* 1.6*   CBC: Recent Labs  Lab 01/12/23 1845 01/13/23 0625 01/14/23 0235 01/15/23 0240 01/16/23 0259 01/17/23 0409 01/18/23 0310  WBC 29.3* 31.7* 26.5* 22.2* 18.7* 16.0* 14.1*  NEUTROABS 22.6* 25.5*  --   --   --   --   --   HGB 7.0* 6.7* 9.5* 9.4* 9.3* 9.1* 9.1*  HCT 22.7* 21.3* 29.0* 29.6* 29.1* 29.0* 28.5*  MCV 99.1 98.2 92.1 91.9 93.6 93.5 94.1  PLT 194 179 160 148* 142* 141* 138*   CBG: No results for input(s): "GLUCAP" in the last 168 hours. Hgb  A1c No results for input(s): "HGBA1C" in the last 72 hours. Lipid Profile No results for input(s): "CHOL", "HDL", "LDLCALC", "TRIG", "CHOLHDL", "LDLDIRECT" in the last 72 hours. Thyroid function studies No results for input(s): "TSH", "T4TOTAL", "T3FREE", "THYROIDAB" in the last 72 hours.  Invalid input(s): "FREET3" Urinalysis    Component Value Date/Time   COLORURINE YELLOW 01/12/2023 2311   APPEARANCEUR HAZY (A) 01/12/2023 2311   LABSPEC 1.021 01/12/2023 2311   LABSPEC 1.020 07/16/2017 1540   PHURINE 5.0 01/12/2023 2311   GLUCOSEU NEGATIVE 01/12/2023 2311   GLUCOSEU Negative 07/16/2017 1540   HGBUR MODERATE (A) 01/12/2023 2311   BILIRUBINUR NEGATIVE 01/12/2023 2311   BILIRUBINUR Negative 07/16/2017 1540   KETONESUR NEGATIVE 01/12/2023 2311   PROTEINUR NEGATIVE 01/12/2023 2311   UROBILINOGEN 0.2 07/16/2017 1540   NITRITE NEGATIVE 01/12/2023 2311   LEUKOCYTESUR NEGATIVE 01/12/2023 2311   LEUKOCYTESUR Negative 07/16/2017 1540    FURTHER DISCHARGE INSTRUCTIONS:   Get Medicines reviewed and adjusted: Please take all your medications with you for your next visit with your Primary MD   Laboratory/radiological data: Please request your Primary MD to go over all hospital tests and procedure/radiological results at the follow up, please ask your Primary MD to get all Hospital records sent to his/her  office.   In some cases, they will be blood work, cultures and biopsy results pending at the time of your discharge. Please request that your primary care M.D. goes through all the records of your hospital data and follows up on these results.   Also Note the following: If you experience worsening of your admission symptoms, develop shortness of breath, life threatening emergency, suicidal or homicidal thoughts you must seek medical attention immediately by calling 911 or calling your MD immediately  if symptoms less severe.   You must read complete instructions/literature along with all the possible adverse reactions/side effects for all the Medicines you take and that have been prescribed to you. Take any new Medicines after you have completely understood and accpet all the possible adverse reactions/side effects.    Do not drive when taking Pain medications or sleeping medications (Benzodaizepines)   Do not take more than prescribed Pain, Sleep and Anxiety Medications. It is not advisable to combine anxiety,sleep and pain medications without talking with your primary care practitioner   Special Instructions: If you have smoked or chewed Tobacco  in the last 2 yrs please stop smoking, stop any regular Alcohol  and or any Recreational drug use.   Wear Seat belts while driving.   Please note: You were cared for by a hospitalist during your hospital stay. Once you are discharged, your primary care physician will handle any further medical issues. Please note that NO REFILLS for any discharge medications will be authorized once you are discharged, as it is imperative that you return to your primary care physician (or establish a relationship with a primary care physician if you do not have one) for your post hospital discharge needs so that they can reassess your need for medications and monitor your lab values.  Time coordinating discharge: 40 minutes  SIGNED:  Marzetta Board, MD,  PhD 01/19/2023, 9:46 AM

## 2023-01-19 NOTE — Progress Notes (Signed)
Reviewed all discharge instructions with patient and caregiver including follow up appointments and medications. Patient and caregiver verbalized understanding. Questions answered. Patient discharged to home with PICC line in place for home IV antibiotics. Dwane Andres, Laurel Dimmer, RN

## 2023-01-19 NOTE — Progress Notes (Signed)
   01/18/23 1206  Assess: MEWS Score  Temp 98.2 F (36.8 C)  BP (!) 159/83  MAP (mmHg) 104  Pulse Rate (!) 102 (Patient was just moved from chair to bed.)  Resp (!) 22 (Patient was just moved from chair to bed.)  SpO2 93 %  O2 Device Room Air  Assess: MEWS Score  MEWS Temp 0  MEWS Systolic 0  MEWS Pulse 1  MEWS RR 1  MEWS LOC 0  MEWS Score 2  MEWS Score Color Yellow  Assess: if the MEWS score is Yellow or Red  Were vital signs taken at a resting state? Yes  Focused Assessment No change from prior assessment  Does the patient meet 2 or more of the SIRS criteria? Yes  Does the patient have a confirmed or suspected source of infection? Yes  Provider and Rapid Response Notified? No  MEWS guidelines implemented  Yes, yellow  Treat  MEWS Interventions Considered administering scheduled or prn medications/treatments as ordered  Take Vital Signs  Increase Vital Sign Frequency  Yellow: Q2hr x1, continue Q4hrs until patient remains green for 12hrs  Escalate  MEWS: Escalate Yellow: Discuss with charge nurse and consider notifying provider and/or RRT  Notify: Charge Nurse/RN  Name of Charge Nurse/RN Notified J. Mays RN  Assess: SIRS CRITERIA  SIRS Temperature  0  SIRS Pulse 1  SIRS Respirations  1  SIRS WBC 1  SIRS Score Sum  3

## 2023-01-19 NOTE — Progress Notes (Signed)
Oncology Discharge Planning Note  [facility] Address: [address] [place], [place], [place] [address] Hours of Operation:  Nena Polio, Monday - Friday  Clinic Contact Information:  [phone]  Oncology Care Team: Medical Oncologist:  Benay Spice  Patient Details: Name:  Crystal [name], Crystal [name] MRN:   [id number] DOB:   30-Sep-1946 Reason for Current Admission: @PPROB @  Discharge Planning Narrative: Notification of admission received by inpatient team for Crystal [name].  Discharge follow-up appointments for oncology are current and available on the AVS and MyChart.   Upon discharge from the hospital, hematology/oncology's post discharge plan of care for the outpatient setting is: January 24, 2023 at  9:15am for PICC line care [facility] [place], [place] [address]  [phone]   Crystal [name] will be called within two business days after discharge to review hematology/oncology's plan of care for full understanding.    Outpatient Oncology Specific Care Only: Oncology appointment transportation needs addressed?:  no Oncology medication management for symptom management addressed?:  yes Chemo Alert Card reviewed?:  yes Immunotherapy Alert Card reviewed?:  yes

## 2023-01-19 NOTE — Plan of Care (Signed)
  Problem: Health Behavior/Discharge Planning: Goal: Ability to manage health-related needs will improve Outcome: Adequate for Discharge   Problem: Clinical Measurements: Goal: Ability to maintain clinical measurements within normal limits will improve Outcome: Adequate for Discharge Goal: Will remain free from infection Outcome: Adequate for Discharge Goal: Diagnostic test results will improve Outcome: Adequate for Discharge Goal: Respiratory complications will improve Outcome: Adequate for Discharge Goal: Cardiovascular complication will be avoided Outcome: Adequate for Discharge   Problem: Safety: Goal: Ability to remain free from injury will improve Outcome: Adequate for Discharge   Problem: Elimination: Goal: Will not experience complications related to bowel motility Outcome: Adequate for Discharge Goal: Will not experience complications related to urinary retention Outcome: Adequate for Discharge

## 2023-01-19 NOTE — Progress Notes (Signed)
Chaplain provided emotional support to Crystal [name] as well as prayer at her request.  She was grateful for the visit.    [address] [place] Pager, [phone]

## 2023-01-21 ENCOUNTER — Encounter (HOSPITAL_COMMUNITY): Payer: Self-pay | Admitting: Cardiology

## 2023-01-22 ENCOUNTER — Ambulatory Visit: Payer: No Typology Code available for payment source | Admitting: Pulmonary Disease

## 2023-01-22 DIAGNOSIS — J910 Malignant pleural effusion: Secondary | ICD-10-CM | POA: Diagnosis not present

## 2023-01-22 DIAGNOSIS — C8290 Follicular lymphoma, unspecified, unspecified site: Secondary | ICD-10-CM | POA: Diagnosis not present

## 2023-01-22 DIAGNOSIS — D630 Anemia in neoplastic disease: Secondary | ICD-10-CM | POA: Diagnosis not present

## 2023-01-22 DIAGNOSIS — C7951 Secondary malignant neoplasm of bone: Secondary | ICD-10-CM | POA: Diagnosis not present

## 2023-01-22 DIAGNOSIS — I130 Hypertensive heart and chronic kidney disease with heart failure and stage 1 through stage 4 chronic kidney disease, or unspecified chronic kidney disease: Secondary | ICD-10-CM | POA: Diagnosis not present

## 2023-01-22 DIAGNOSIS — J9601 Acute respiratory failure with hypoxia: Secondary | ICD-10-CM | POA: Diagnosis not present

## 2023-01-22 DIAGNOSIS — N1831 Chronic kidney disease, stage 3a: Secondary | ICD-10-CM | POA: Diagnosis not present

## 2023-01-22 DIAGNOSIS — C8599 Non-Hodgkin lymphoma, unspecified, extranodal and solid organ sites: Secondary | ICD-10-CM | POA: Diagnosis not present

## 2023-01-22 DIAGNOSIS — C50919 Malignant neoplasm of unspecified site of unspecified female breast: Secondary | ICD-10-CM | POA: Diagnosis not present

## 2023-01-22 DIAGNOSIS — I5022 Chronic systolic (congestive) heart failure: Secondary | ICD-10-CM | POA: Diagnosis not present

## 2023-01-22 DIAGNOSIS — T80211D Bloodstream infection due to central venous catheter, subsequent encounter: Secondary | ICD-10-CM | POA: Diagnosis not present

## 2023-01-22 DIAGNOSIS — Z008 Encounter for other general examination: Secondary | ICD-10-CM | POA: Diagnosis not present

## 2023-01-22 DIAGNOSIS — A4101 Sepsis due to Methicillin susceptible Staphylococcus aureus: Secondary | ICD-10-CM | POA: Diagnosis not present

## 2023-01-22 DIAGNOSIS — B9561 Methicillin susceptible Staphylococcus aureus infection as the cause of diseases classified elsewhere: Secondary | ICD-10-CM | POA: Diagnosis not present

## 2023-01-22 DIAGNOSIS — C7800 Secondary malignant neoplasm of unspecified lung: Secondary | ICD-10-CM | POA: Diagnosis not present

## 2023-01-23 ENCOUNTER — Other Ambulatory Visit: Payer: Self-pay

## 2023-01-24 ENCOUNTER — Inpatient Hospital Stay: Payer: No Typology Code available for payment source

## 2023-01-24 ENCOUNTER — Inpatient Hospital Stay: Payer: No Typology Code available for payment source | Attending: Oncology

## 2023-01-24 ENCOUNTER — Inpatient Hospital Stay: Payer: No Typology Code available for payment source | Admitting: Nurse Practitioner

## 2023-01-24 DIAGNOSIS — Z79620 Long term (current) use of immunosuppressive biologic: Secondary | ICD-10-CM | POA: Diagnosis not present

## 2023-01-24 DIAGNOSIS — Z5111 Encounter for antineoplastic chemotherapy: Secondary | ICD-10-CM | POA: Diagnosis not present

## 2023-01-24 DIAGNOSIS — Z5112 Encounter for antineoplastic immunotherapy: Secondary | ICD-10-CM | POA: Diagnosis not present

## 2023-01-24 DIAGNOSIS — Z452 Encounter for adjustment and management of vascular access device: Secondary | ICD-10-CM | POA: Insufficient documentation

## 2023-01-24 DIAGNOSIS — E876 Hypokalemia: Secondary | ICD-10-CM | POA: Insufficient documentation

## 2023-01-24 DIAGNOSIS — C342 Malignant neoplasm of middle lobe, bronchus or lung: Secondary | ICD-10-CM | POA: Diagnosis not present

## 2023-01-24 DIAGNOSIS — Z5189 Encounter for other specified aftercare: Secondary | ICD-10-CM | POA: Diagnosis not present

## 2023-01-24 NOTE — Patient Instructions (Signed)

## 2023-01-25 ENCOUNTER — Inpatient Hospital Stay: Payer: No Typology Code available for payment source

## 2023-01-25 DIAGNOSIS — R7881 Bacteremia: Secondary | ICD-10-CM | POA: Diagnosis not present

## 2023-01-25 DIAGNOSIS — R69 Illness, unspecified: Secondary | ICD-10-CM | POA: Diagnosis not present

## 2023-01-28 ENCOUNTER — Other Ambulatory Visit: Payer: Self-pay | Admitting: Oncology

## 2023-01-28 DIAGNOSIS — C3490 Malignant neoplasm of unspecified part of unspecified bronchus or lung: Secondary | ICD-10-CM

## 2023-01-28 DIAGNOSIS — C342 Malignant neoplasm of middle lobe, bronchus or lung: Secondary | ICD-10-CM

## 2023-01-29 ENCOUNTER — Encounter: Payer: Self-pay | Admitting: *Deleted

## 2023-01-29 ENCOUNTER — Inpatient Hospital Stay: Payer: No Typology Code available for payment source

## 2023-01-29 ENCOUNTER — Other Ambulatory Visit: Payer: Self-pay | Admitting: *Deleted

## 2023-01-29 ENCOUNTER — Telehealth: Payer: Self-pay | Admitting: *Deleted

## 2023-01-29 VITALS — BP 116/83 | HR 93 | Temp 97.4°F | Resp 18

## 2023-01-29 VITALS — BP 113/68 | HR 94 | Resp 18

## 2023-01-29 DIAGNOSIS — C3490 Malignant neoplasm of unspecified part of unspecified bronchus or lung: Secondary | ICD-10-CM

## 2023-01-29 DIAGNOSIS — E876 Hypokalemia: Secondary | ICD-10-CM

## 2023-01-29 DIAGNOSIS — C342 Malignant neoplasm of middle lobe, bronchus or lung: Secondary | ICD-10-CM

## 2023-01-29 DIAGNOSIS — Z452 Encounter for adjustment and management of vascular access device: Secondary | ICD-10-CM

## 2023-01-29 DIAGNOSIS — Z5112 Encounter for antineoplastic immunotherapy: Secondary | ICD-10-CM | POA: Diagnosis not present

## 2023-01-29 LAB — CBC WITH DIFFERENTIAL (CANCER CENTER ONLY)
Abs Immature Granulocytes: 0.03 10*3/uL (ref 0.00–0.07)
Basophils Absolute: 0 10*3/uL (ref 0.0–0.1)
Basophils Relative: 1 %
Eosinophils Absolute: 0 10*3/uL (ref 0.0–0.5)
Eosinophils Relative: 0 %
HCT: 29.4 % — ABNORMAL LOW (ref 36.0–46.0)
Hemoglobin: 9.3 g/dL — ABNORMAL LOW (ref 12.0–15.0)
Immature Granulocytes: 0 %
Lymphocytes Relative: 35 %
Lymphs Abs: 2.4 10*3/uL (ref 0.7–4.0)
MCH: 29.5 pg (ref 26.0–34.0)
MCHC: 31.6 g/dL (ref 30.0–36.0)
MCV: 93.3 fL (ref 80.0–100.0)
Monocytes Absolute: 0.5 10*3/uL (ref 0.1–1.0)
Monocytes Relative: 7 %
Neutro Abs: 3.9 10*3/uL (ref 1.7–7.7)
Neutrophils Relative %: 57 %
Platelet Count: 122 10*3/uL — ABNORMAL LOW (ref 150–400)
RBC: 3.15 MIL/uL — ABNORMAL LOW (ref 3.87–5.11)
RDW: 18.2 % — ABNORMAL HIGH (ref 11.5–15.5)
WBC Count: 6.8 10*3/uL (ref 4.0–10.5)
nRBC: 0 % (ref 0.0–0.2)

## 2023-01-29 LAB — CMP (CANCER CENTER ONLY)
ALT: <5 U/L (ref 0–44)
AST: 16 U/L (ref 15–41)
Albumin: 2.2 g/dL — ABNORMAL LOW (ref 3.5–5.0)
Alkaline Phosphatase: 69 U/L (ref 38–126)
Anion gap: 7 (ref 5–15)
BUN: 9 mg/dL (ref 8–23)
CO2: 29 mmol/L (ref 22–32)
Calcium: 7.1 mg/dL — ABNORMAL LOW (ref 8.9–10.3)
Chloride: 102 mmol/L (ref 98–111)
Creatinine: 1.05 mg/dL — ABNORMAL HIGH (ref 0.44–1.00)
GFR, Estimated: 55 mL/min — ABNORMAL LOW (ref >60)
Glucose, Bld: 116 mg/dL — ABNORMAL HIGH (ref 70–99)
Potassium: 2.7 mmol/L — CL (ref 3.5–5.1)
Sodium: 138 mmol/L (ref 135–145)
Total Bilirubin: 0.2 mg/dL — ABNORMAL LOW (ref 0.3–1.2)
Total Protein: 6.4 g/dL — ABNORMAL LOW (ref 6.5–8.1)

## 2023-01-29 LAB — SAMPLE TO BLOOD BANK

## 2023-01-29 LAB — MAGNESIUM: Magnesium: 1 mg/dL — ABNORMAL LOW (ref 1.7–2.4)

## 2023-01-29 MED ORDER — HEPARIN SOD (PORK) LOCK FLUSH 100 UNIT/ML IV SOLN: 250.0000 [IU] | Freq: Once | INTRAVENOUS | Status: DC

## 2023-01-29 MED ORDER — SODIUM CHLORIDE 0.9% FLUSH
10.0000 mL | Freq: Once | INTRAVENOUS | Status: AC
  Administered 2023-01-29: 10 mL via INTRAVENOUS

## 2023-01-29 MED ORDER — HEPARIN SOD (PORK) LOCK FLUSH 100 UNIT/ML IV SOLN
250.0000 [IU] | Freq: Once | INTRAVENOUS | Status: AC
  Administered 2023-01-29: 250 [IU] via INTRAVENOUS

## 2023-01-29 MED ORDER — SODIUM CHLORIDE 0.9% FLUSH: 10.0000 mL | Freq: Once | INTRAVENOUS | Status: DC

## 2023-01-29 MED ORDER — SODIUM CHLORIDE 0.9 % IV SOLN: INTRAVENOUS | Status: AC

## 2023-01-29 MED ORDER — POTASSIUM CHLORIDE 10 MEQ/100ML IV SOLN
10.0000 meq | INTRAVENOUS | Status: AC
  Administered 2023-01-29 (×3): 10 meq via INTRAVENOUS
  Filled 2023-01-29: qty 100

## 2023-01-29 MED ORDER — MAGNESIUM SULFATE 4 GM/100ML IV SOLN
4.0000 g | Freq: Once | INTRAVENOUS | Status: AC
  Administered 2023-01-29: 4 g via INTRAVENOUS
  Filled 2023-01-29: qty 100

## 2023-01-29 NOTE — Progress Notes (Signed)
Crystal [name] asking if she should continue her sucralfate qid (almost out). Per Dr. Truett Perna: no longer needed since mucositis resolved.

## 2023-01-29 NOTE — Patient Instructions (Signed)
PICC Home Care Guide A peripherally inserted central catheter (PICC) is a form of IV access that allows medicines and IV fluids to be quickly put into the blood and spread throughout the body. The PICC is a long, thin, flexible tube (catheter) that is put into a vein in a person's arm or leg. The catheter ends in a large vein just outside the heart called the superior vena cava (SVC). After the PICC is put in, a chest X-ray may be done to make sure that it is in the right place. A PICC may be placed for different reasons, such as: To give medicines and liquid nutrition. To give IV fluids and blood products. To take blood samples often. If there is trouble placing a peripheral intravenous (PIV) catheter. If cared for properly, a PICC can remain in place for many months. Having a PICC can allow you to go home from the hospital sooner and continue treatment at home. Medicines and PICC care can be managed at home by a family member, caregiver, or home health care team. What are the risks? Generally, having a PICC is safe. However, problems may occur, including: A blood clot (thrombus) forming in or at the end of the PICC. A blood clot forming in a vein (deep vein thrombosis) or traveling to the lung (pulmonary embolism). Inflammation of the vein (phlebitis) in which the PICC is placed. Infection at the insertion site or in the blood. Blood infections from central lines, like PICCs, can be serious and often require a hospital stay. PICC malposition, or PICC movement or poor placement. A break or cut in the PICC. Do not use scissors near the PICC. Nerve or tendon irritation or injury during PICC insertion. How to care for your PICC Please follow the specific guidelines provided by your health care provider. Preventing infection You and any caregivers should wash your hands often with soap and water for at least 20 seconds. Wash hands: Before touching the PICC or the infusion device. Before changing a  bandage (dressing). Do not change the dressing unless you have been taught to do so and have shown you are able to change it safely. Flush the PICC as told. Tell your health care provider right away if the PICC is hard to flush or does not flush. Do not use force to flush the PICC. Use clean and germ-free (sterile) supplies only. Keep the supplies in a dry place. Do not reuse needles, syringes, or any other supplies. Reusing supplies can lead to infection. Keep the PICC dressing dry and secure it with tape if the edges stop sticking to your skin. Check your PICC insertion site every day for signs of infection. Check for: Redness, swelling, or pain. Fluid or blood. Warmth. Pus or a bad smell. Preventing other problems Do not use a syringe that is less than 10 mL to flush the PICC. Do not have your blood pressure checked on the arm in which the PICC is placed. Do not ever pull or tug on the PICC. Keep it secured to your arm with tape or a stretch wrap when not in use. Do not take the PICC out yourself. Only a trained health care provider should remove the PICC. Keep pets and children away from your PICC. How to care for your PICC dressing Keep your PICC dressing clean and dry to prevent infection. Do not take baths, swim, or use a hot tub until your health care provider approves. Ask your health care provider if you can take   showers. You may only be allowed to take sponge baths. When you are allowed to shower: Ask your health care provider to teach you how to wrap the PICC. Cover the PICC with clear plastic wrap and tape to keep it dry while showering. Follow instructions from your health care provider about how to take care of your insertion site and dressing. Make sure you: Wash your hands with soap and water for at least 20 seconds before and after you change your dressing. If soap and water are not available, use hand sanitizer. Change your dressing only if taught to do so by your health care  provider. Your PICC dressing needs to be changed if it becomes loose or wet. Leave stitches (sutures), skin glue, or adhesive strips in place. These skin closures may need to stay in place for 2 weeks or longer. If adhesive strip edges start to loosen and curl up, you may trim the loose edges. Do not remove adhesive strips completely unless your health care provider tells you to do that. Follow these instructions at home: Disposal of supplies Throw away any syringes in a disposal container that is meant for sharp items (sharps container). You can buy a sharps container from a pharmacy, or you can make one by using an empty, hard plastic bottle with a lid. Place any used dressings or infusion bags into a plastic bag. Throw that bag in the trash. General instructions  Always carry your PICC identification card or wear a medical alert bracelet. Keep the tube clamped at all times, unless it is being used. Always carry a smooth-edge clamp with you to clamp the PICC if it breaks. Do not use scissors or sharp objects near the tube. You may bend your arm and move it freely. If your PICC is near or at the bend of your elbow, avoid activity with repeated motion at the elbow. Avoid lifting heavy objects as told by your health care provider. Keep all follow-up visits. This is important. You will need to have your PICC dressing changed at least once a week. Contact a health care provider if: You have pain in your arm, ear, face, or teeth. You have a fever or chills. You have redness, swelling, or pain around the insertion site. You have fluid or blood coming from the insertion site. Your insertion site feels warm to the touch. You have pus or a bad smell coming from the insertion site. Your skin feels hard and raised around the insertion site. Your PICC dressing has gotten wet or is coming off and you have not been taught how to change it. Get help right away if: You have problems with your PICC, such as  your PICC: Was tugged or pulled and has partially come out. Do not  push the PICC back in. Cannot be flushed, is hard to flush, or leaks around the insertion site when it is flushed. Makes a flushing sound when it is flushed. Appears to have a hole or tear. Is accidentally pulled all the way out. If this happens, cover the insertion site with a gauze dressing. Do not throw the PICC away. Your health care provider will need to check it to be sure the entire catheter came out. You feel your heart racing or skipping beats, or you have chest pain. You have shortness of breath or trouble breathing. You have swelling, redness, warmth, or pain in the arm in which the PICC is placed. You have a red streak going up your arm that   starts under the PICC dressing. These symptoms may be an emergency. Get help right away. Call 911. Do not wait to see if the symptoms will go away. Do not drive yourself to the hospital. Summary A peripherally inserted central catheter (PICC) is a long, thin, flexible tube (catheter) that is put into a vein in the arm or leg. If cared for properly, a PICC can remain in place for many months. Having a PICC can allow you to go home from the hospital sooner and continue treatment at home. The PICC is inserted using a germ-free (sterile) technique by a specially trained health care provider. Only a trained health care provider should remove it. Do not have your blood pressure checked on the arm in which your PICC is placed. Always keep your PICC identification card with you. This information is not intended to replace advice given to you by your health care provider. Make sure you discuss any questions you have with your health care provider. Document Revised: 04/27/2021 Document Reviewed: 04/27/2021 Elsevier Patient Education  2023 Elsevier Inc.  

## 2023-01-29 NOTE — Patient Instructions (Signed)
Potassium Chloride Injection What is this medication? POTASSIUM CHLORIDE (poe TASS i um KLOOR ide) prevents and treats low levels of potassium in your body. Potassium plays an important role in maintaining the health of your kidneys, heart, muscles, and nervous system. This medicine may be used for other purposes; ask your health care provider or pharmacist if you have questions. COMMON BRAND NAME(S): PROAMP What should I tell my care team before I take this medication? They need to know if you have any of these conditions: Addison disease Dehydration Diabetes (high blood sugar) Heart disease High levels of potassium in the blood Irregular heartbeat or rhythm Kidney disease Large areas of burned skin An unusual or allergic reaction to potassium, other medications, foods, dyes, or preservatives Pregnant or trying to get pregnant Breast-feeding How should I use this medication? This medication is injected into a vein. It is given in a hospital or clinic setting. Talk to your care team about the use of this medication in children. Special care may be needed. Overdosage: If you think you have taken too much of this medicine contact a poison control center or emergency room at once. NOTE: This medicine is only for you. Do not share this medicine with others. What if I miss a dose? This does not apply. This medication is not for regular use. What may interact with this medication? Do not take this medication with any of the following: Certain diuretics, such as spironolactone, triamterene Eplerenone Sodium polystyrene sulfonate This medication may also interact with the following: Certain medications for blood pressure or heart disease, such as lisinopril, losartan, quinapril, valsartan Medications that lower your chance of fighting infection, such as cyclosporine, tacrolimus NSAIDs, medications for pain and inflammation, such as ibuprofen or naproxen Other potassium supplements Salt  substitutes This list may not describe all possible interactions. Give your health care provider a list of all the medicines, herbs, non-prescription drugs, or dietary supplements you use. Also tell them if you smoke, drink alcohol, or use illegal drugs. Some items may interact with your medicine. What should I watch for while using this medication? Visit your care team for regular checks on your progress. Tell your care team if your symptoms do not start to get better or if they get worse. You may need blood work while you are taking this medication. Avoid salt substitutes unless you are told otherwise by your care team. What side effects may I notice from receiving this medication? Side effects that you should report to your care team as soon as possible: Allergic reactions--skin rash, itching, hives, swelling of the face, lips, tongue, or throat High potassium level--muscle weakness, fast or irregular heartbeat Side effects that usually do not require medical attention (report to your care team if they continue or are bothersome): Diarrhea Nausea Stomach pain Vomiting This list may not describe all possible side effects. Call your doctor for medical advice about side effects. You may report side effects to FDA at 1-800-FDA-1088. Where should I keep my medication? This medication is given in a hospital or clinic. It will not be stored at home. NOTE: This sheet is a summary. It may not cover all possible information. If you have questions about this medicine, talk to your doctor, pharmacist, or health care provider.  2023 Elsevier/Gold Standard (2007-11-30 00:00:00)  Magnesium Sulfate Injection What is this medication? MAGNESIUM SULFATE (mag NEE zee um SUL fate) prevents and treats low levels of magnesium in your body. It may also be used to prevent and treat  seizures during pregnancy in people with high blood pressure disorders, such as preeclampsia or eclampsia. Magnesium plays an  important role in maintaining the health of your muscles and nervous system. This medicine may be used for other purposes; ask your health care provider or pharmacist if you have questions. What should I tell my care team before I take this medication? They need to know if you have any of these conditions: Heart disease History of irregular heart beat Kidney disease An unusual or allergic reaction to magnesium sulfate, medications, foods, dyes, or preservatives Pregnant or trying to get pregnant Breast-feeding How should I use this medication? This medication is for infusion into a vein. It is given in a hospital or clinic setting. Talk to your care team about the use of this medication in children. While this medication may be prescribed for selected conditions, precautions do apply. Overdosage: If you think you have taken too much of this medicine contact a poison control center or emergency room at once. NOTE: This medicine is only for you. Do not share this medicine with others. What if I miss a dose? This does not apply. What may interact with this medication? Certain medications for anxiety or sleep Certain medications for seizures, such phenobarbital Digoxin Medications that relax muscles for surgery Narcotic medications for pain This list may not describe all possible interactions. Give your health care provider a list of all the medicines, herbs, non-prescription drugs, or dietary supplements you use. Also tell them if you smoke, drink alcohol, or use illegal drugs. Some items may interact with your medicine. What should I watch for while using this medication? Your condition will be monitored carefully while you are receiving this medication. You may need blood work done while you are receiving this medication. What side effects may I notice from receiving this medication? Side effects that you should report to your care team as soon as possible: Allergic reactions--skin rash,  itching, hives, swelling of the face, lips, tongue, or throat High magnesium level--confusion, drowsiness, facial flushing, redness, sweating, muscle weakness, fast or irregular heartbeat, trouble breathing Low blood pressure--dizziness, feeling faint or lightheaded, blurry vision Side effects that usually do not require medical attention (report to your care team if they continue or are bothersome): Headache Nausea This list may not describe all possible side effects. Call your doctor for medical advice about side effects. You may report side effects to FDA at 1-800-FDA-1088. Where should I keep my medication? This medication is given in a hospital or clinic and will not be stored at home. NOTE: This sheet is a summary. It may not cover all possible information. If you have questions about this medicine, talk to your doctor, pharmacist, or health care provider.  2023 Elsevier/Gold Standard (2013-02-14 00:00:00)

## 2023-01-29 NOTE — Telephone Encounter (Signed)
Orders placed.

## 2023-01-29 NOTE — Progress Notes (Signed)
CRITICAL VALUE STICKER  CRITICAL VALUE: K+ 2.7 and Mg+ 1.0  RECEIVER (on-site recipient of call):Ariyanah Aguado,RN  DATE & TIME NOTIFIED: 01/29/23 @ 1205  MESSENGER (representative from lab):Maurine Minister   MD NOTIFIED: Dr. Truett Perna  TIME OF NOTIFICATION: 1210  RESPONSE: Give 1 liter NS; 4 grams Mg+ and 30 meq KCL today. Ms. Jackovich called and she is on the way.

## 2023-01-30 ENCOUNTER — Encounter: Payer: Self-pay | Admitting: Oncology

## 2023-01-30 ENCOUNTER — Other Ambulatory Visit: Payer: Self-pay | Admitting: *Deleted

## 2023-01-30 ENCOUNTER — Other Ambulatory Visit: Payer: Self-pay | Admitting: Nurse Practitioner

## 2023-01-30 ENCOUNTER — Inpatient Hospital Stay: Payer: No Typology Code available for payment source | Admitting: Licensed Clinical Social Worker

## 2023-01-30 DIAGNOSIS — C3490 Malignant neoplasm of unspecified part of unspecified bronchus or lung: Secondary | ICD-10-CM

## 2023-01-30 DIAGNOSIS — R197 Diarrhea, unspecified: Secondary | ICD-10-CM

## 2023-01-30 MED ORDER — MAGNESIUM OXIDE -MG SUPPLEMENT 400 (240 MG) MG PO TABS
ORAL_TABLET | ORAL | 1 refills | Status: DC
Start: 2023-01-30 — End: 2023-03-21

## 2023-01-30 NOTE — Progress Notes (Signed)
[facility] CSW Progress Note  Clinical Child psychotherapist contacted caregiver by phone to assess needs.  Spoke with patient's niece, Aundra Millet.  She inquired about home care for patient.  Securely emailed her [facility] contact information, as well as, local home care agencies.  Patient's sister is currently staying with patient and other family members are assisting her.    Pascal Lux Kura Bethards, LCSW

## 2023-01-31 ENCOUNTER — Inpatient Hospital Stay (HOSPITAL_BASED_OUTPATIENT_CLINIC_OR_DEPARTMENT_OTHER): Payer: No Typology Code available for payment source | Admitting: Oncology

## 2023-01-31 ENCOUNTER — Ambulatory Visit: Payer: No Typology Code available for payment source | Admitting: Internal Medicine

## 2023-01-31 ENCOUNTER — Other Ambulatory Visit: Payer: Self-pay | Admitting: *Deleted

## 2023-01-31 ENCOUNTER — Inpatient Hospital Stay: Payer: No Typology Code available for payment source

## 2023-01-31 ENCOUNTER — Encounter: Payer: Self-pay | Admitting: *Deleted

## 2023-01-31 VITALS — BP 125/80 | HR 60 | Temp 98.1°F | Resp 18 | Ht 65.0 in | Wt 211.0 lb

## 2023-01-31 DIAGNOSIS — C342 Malignant neoplasm of middle lobe, bronchus or lung: Secondary | ICD-10-CM

## 2023-01-31 DIAGNOSIS — Z452 Encounter for adjustment and management of vascular access device: Secondary | ICD-10-CM

## 2023-01-31 DIAGNOSIS — E876 Hypokalemia: Secondary | ICD-10-CM

## 2023-01-31 DIAGNOSIS — C3490 Malignant neoplasm of unspecified part of unspecified bronchus or lung: Secondary | ICD-10-CM | POA: Diagnosis not present

## 2023-01-31 DIAGNOSIS — Z5112 Encounter for antineoplastic immunotherapy: Secondary | ICD-10-CM | POA: Diagnosis not present

## 2023-01-31 LAB — CBC WITH DIFFERENTIAL (CANCER CENTER ONLY)
Abs Immature Granulocytes: 0.03 10*3/uL (ref 0.00–0.07)
Basophils Absolute: 0 10*3/uL (ref 0.0–0.1)
Basophils Relative: 1 %
Eosinophils Absolute: 0 10*3/uL (ref 0.0–0.5)
Eosinophils Relative: 0 %
HCT: 28.1 % — ABNORMAL LOW (ref 36.0–46.0)
Hemoglobin: 8.8 g/dL — ABNORMAL LOW (ref 12.0–15.0)
Immature Granulocytes: 1 %
Lymphocytes Relative: 37 %
Lymphs Abs: 2.4 10*3/uL (ref 0.7–4.0)
MCH: 29.1 pg (ref 26.0–34.0)
MCHC: 31.3 g/dL (ref 30.0–36.0)
MCV: 93 fL (ref 80.0–100.0)
Monocytes Absolute: 0.4 10*3/uL (ref 0.1–1.0)
Monocytes Relative: 7 %
Neutro Abs: 3.7 10*3/uL (ref 1.7–7.7)
Neutrophils Relative %: 54 %
Platelet Count: 132 10*3/uL — ABNORMAL LOW (ref 150–400)
RBC: 3.02 MIL/uL — ABNORMAL LOW (ref 3.87–5.11)
RDW: 18.6 % — ABNORMAL HIGH (ref 11.5–15.5)
WBC Count: 6.6 10*3/uL (ref 4.0–10.5)
nRBC: 0 % (ref 0.0–0.2)

## 2023-01-31 LAB — CMP (CANCER CENTER ONLY)
ALT: <5 U/L (ref 0–44)
AST: 16 U/L (ref 15–41)
Albumin: 2.2 g/dL — ABNORMAL LOW (ref 3.5–5.0)
Alkaline Phosphatase: 60 U/L (ref 38–126)
Anion gap: 6 (ref 5–15)
BUN: 12 mg/dL (ref 8–23)
CO2: 29 mmol/L (ref 22–32)
Calcium: 7.2 mg/dL — ABNORMAL LOW (ref 8.9–10.3)
Chloride: 104 mmol/L (ref 98–111)
Creatinine: 1.1 mg/dL — ABNORMAL HIGH (ref 0.44–1.00)
GFR, Estimated: 52 mL/min — ABNORMAL LOW (ref >60)
Glucose, Bld: 101 mg/dL — ABNORMAL HIGH (ref 70–99)
Potassium: 3.1 mmol/L — ABNORMAL LOW (ref 3.5–5.1)
Sodium: 139 mmol/L (ref 135–145)
Total Bilirubin: 0.2 mg/dL — ABNORMAL LOW (ref 0.3–1.2)
Total Protein: 6.4 g/dL — ABNORMAL LOW (ref 6.5–8.1)

## 2023-01-31 LAB — MAGNESIUM: Magnesium: 1.4 mg/dL — ABNORMAL LOW (ref 1.7–2.4)

## 2023-01-31 MED ORDER — HEPARIN SOD (PORK) LOCK FLUSH 100 UNIT/ML IV SOLN
250.0000 [IU] | Freq: Once | INTRAVENOUS | Status: AC
  Administered 2023-01-31: 250 [IU] via INTRAVENOUS

## 2023-01-31 MED ORDER — SODIUM CHLORIDE 0.9% FLUSH
10.0000 mL | Freq: Once | INTRAVENOUS | Status: AC
  Administered 2023-01-31: 10 mL via INTRAVENOUS

## 2023-01-31 NOTE — Progress Notes (Signed)
Call to [facility] (providers her PT) and requested referral for OT in home for safety evaluation and assess for equipment needs. Was instructed to fax order to #[phone]. Order faxed and confirmed receipt.

## 2023-01-31 NOTE — Progress Notes (Signed)
Patient seen by Dr. Truett Perna today  Vitals are within treatment parameters.  Labs reviewed by Dr. Truett Perna and are not all within treatment parameters. Mg+ 1.4; K+  3.1--OK to proceed  Per physician team, patient is ready for treatment. Please note that modifications are being made to the treatment plan including Proceed w/chemo treatment on 02/01/23 with addition of IV Mg+ 2 grams and IV KCL 20 meq.

## 2023-01-31 NOTE — Patient Instructions (Signed)
PICC Home Care Guide A peripherally inserted central catheter (PICC) is a form of IV access that allows medicines and IV fluids to be quickly put into the blood and spread throughout the body. The PICC is a long, thin, flexible tube (catheter) that is put into a vein in a person's arm or leg. The catheter ends in a large vein just outside the heart called the superior vena cava (SVC). After the PICC is put in, a chest X-ray may be done to make sure that it is in the right place. A PICC may be placed for different reasons, such as: To give medicines and liquid nutrition. To give IV fluids and blood products. To take blood samples often. If there is trouble placing a peripheral intravenous (PIV) catheter. If cared for properly, a PICC can remain in place for many months. Having a PICC can allow you to go home from the hospital sooner and continue treatment at home. Medicines and PICC care can be managed at home by a family member, caregiver, or home health care team. What are the risks? Generally, having a PICC is safe. However, problems may occur, including: A blood clot (thrombus) forming in or at the end of the PICC. A blood clot forming in a vein (deep vein thrombosis) or traveling to the lung (pulmonary embolism). Inflammation of the vein (phlebitis) in which the PICC is placed. Infection at the insertion site or in the blood. Blood infections from central lines, like PICCs, can be serious and often require a hospital stay. PICC malposition, or PICC movement or poor placement. A break or cut in the PICC. Do not use scissors near the PICC. Nerve or tendon irritation or injury during PICC insertion. How to care for your PICC Please follow the specific guidelines provided by your health care provider. Preventing infection You and any caregivers should wash your hands often with soap and water for at least 20 seconds. Wash hands: Before touching the PICC or the infusion device. Before changing a  bandage (dressing). Do not change the dressing unless you have been taught to do so and have shown you are able to change it safely. Flush the PICC as told. Tell your health care provider right away if the PICC is hard to flush or does not flush. Do not use force to flush the PICC. Use clean and germ-free (sterile) supplies only. Keep the supplies in a dry place. Do not reuse needles, syringes, or any other supplies. Reusing supplies can lead to infection. Keep the PICC dressing dry and secure it with tape if the edges stop sticking to your skin. Check your PICC insertion site every day for signs of infection. Check for: Redness, swelling, or pain. Fluid or blood. Warmth. Pus or a bad smell. Preventing other problems Do not use a syringe that is less than 10 mL to flush the PICC. Do not have your blood pressure checked on the arm in which the PICC is placed. Do not ever pull or tug on the PICC. Keep it secured to your arm with tape or a stretch wrap when not in use. Do not take the PICC out yourself. Only a trained health care provider should remove the PICC. Keep pets and children away from your PICC. How to care for your PICC dressing Keep your PICC dressing clean and dry to prevent infection. Do not take baths, swim, or use a hot tub until your health care provider approves. Ask your health care provider if you can take   showers. You may only be allowed to take sponge baths. When you are allowed to shower: Ask your health care provider to teach you how to wrap the PICC. Cover the PICC with clear plastic wrap and tape to keep it dry while showering. Follow instructions from your health care provider about how to take care of your insertion site and dressing. Make sure you: Wash your hands with soap and water for at least 20 seconds before and after you change your dressing. If soap and water are not available, use hand sanitizer. Change your dressing only if taught to do so by your health care  provider. Your PICC dressing needs to be changed if it becomes loose or wet. Leave stitches (sutures), skin glue, or adhesive strips in place. These skin closures may need to stay in place for 2 weeks or longer. If adhesive strip edges start to loosen and curl up, you may trim the loose edges. Do not remove adhesive strips completely unless your health care provider tells you to do that. Follow these instructions at home: Disposal of supplies Throw away any syringes in a disposal container that is meant for sharp items (sharps container). You can buy a sharps container from a pharmacy, or you can make one by using an empty, hard plastic bottle with a lid. Place any used dressings or infusion bags into a plastic bag. Throw that bag in the trash. General instructions  Always carry your PICC identification card or wear a medical alert bracelet. Keep the tube clamped at all times, unless it is being used. Always carry a smooth-edge clamp with you to clamp the PICC if it breaks. Do not use scissors or sharp objects near the tube. You may bend your arm and move it freely. If your PICC is near or at the bend of your elbow, avoid activity with repeated motion at the elbow. Avoid lifting heavy objects as told by your health care provider. Keep all follow-up visits. This is important. You will need to have your PICC dressing changed at least once a week. Contact a health care provider if: You have pain in your arm, ear, face, or teeth. You have a fever or chills. You have redness, swelling, or pain around the insertion site. You have fluid or blood coming from the insertion site. Your insertion site feels warm to the touch. You have pus or a bad smell coming from the insertion site. Your skin feels hard and raised around the insertion site. Your PICC dressing has gotten wet or is coming off and you have not been taught how to change it. Get help right away if: You have problems with your PICC, such as  your PICC: Was tugged or pulled and has partially come out. Do not  push the PICC back in. Cannot be flushed, is hard to flush, or leaks around the insertion site when it is flushed. Makes a flushing sound when it is flushed. Appears to have a hole or tear. Is accidentally pulled all the way out. If this happens, cover the insertion site with a gauze dressing. Do not throw the PICC away. Your health care provider will need to check it to be sure the entire catheter came out. You feel your heart racing or skipping beats, or you have chest pain. You have shortness of breath or trouble breathing. You have swelling, redness, warmth, or pain in the arm in which the PICC is placed. You have a red streak going up your arm that   starts under the PICC dressing. These symptoms may be an emergency. Get help right away. Call 911. Do not wait to see if the symptoms will go away. Do not drive yourself to the hospital. Summary A peripherally inserted central catheter (PICC) is a long, thin, flexible tube (catheter) that is put into a vein in the arm or leg. If cared for properly, a PICC can remain in place for many months. Having a PICC can allow you to go home from the hospital sooner and continue treatment at home. The PICC is inserted using a germ-free (sterile) technique by a specially trained health care provider. Only a trained health care provider should remove it. Do not have your blood pressure checked on the arm in which your PICC is placed. Always keep your PICC identification card with you. This information is not intended to replace advice given to you by your health care provider. Make sure you discuss any questions you have with your health care provider. Document Revised: 04/27/2021 Document Reviewed: 04/27/2021 Elsevier Patient Education  2023 Elsevier Inc.  

## 2023-01-31 NOTE — Progress Notes (Signed)
Lab unable to add cortisol, TSH and T4 to today's sample. Will draw tomorrow.
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
## 2023-02-01 ENCOUNTER — Inpatient Hospital Stay: Payer: No Typology Code available for payment source

## 2023-02-01 ENCOUNTER — Other Ambulatory Visit: Payer: No Typology Code available for payment source

## 2023-02-01 ENCOUNTER — Ambulatory Visit: Payer: No Typology Code available for payment source

## 2023-02-01 VITALS — BP 119/75 | HR 93 | Temp 98.3°F | Resp 18

## 2023-02-01 DIAGNOSIS — E876 Hypokalemia: Secondary | ICD-10-CM

## 2023-02-01 DIAGNOSIS — C3490 Malignant neoplasm of unspecified part of unspecified bronchus or lung: Secondary | ICD-10-CM

## 2023-02-01 DIAGNOSIS — Z5112 Encounter for antineoplastic immunotherapy: Secondary | ICD-10-CM | POA: Diagnosis not present

## 2023-02-01 DIAGNOSIS — C342 Malignant neoplasm of middle lobe, bronchus or lung: Secondary | ICD-10-CM

## 2023-02-01 DIAGNOSIS — R7881 Bacteremia: Secondary | ICD-10-CM | POA: Diagnosis not present

## 2023-02-01 LAB — CORTISOL: Cortisol, Plasma: 16.6 ug/dL

## 2023-02-01 LAB — TSH: TSH: 6.219 u[IU]/mL — ABNORMAL HIGH (ref 0.350–4.500)

## 2023-02-01 MED ORDER — SODIUM CHLORIDE 0.9% FLUSH
10.0000 mL | INTRAVENOUS | Status: DC | PRN
  Administered 2023-02-01: 10 mL

## 2023-02-01 MED ORDER — HEPARIN SOD (PORK) LOCK FLUSH 100 UNIT/ML IV SOLN
250.0000 [IU] | Freq: Once | INTRAVENOUS | Status: AC | PRN
  Administered 2023-02-01: 250 [IU]

## 2023-02-01 MED ORDER — DIPHENHYDRAMINE HCL 50 MG/ML IJ SOLN
50.0000 mg | Freq: Once | INTRAMUSCULAR | Status: AC
  Administered 2023-02-01: 50 mg via INTRAVENOUS
  Filled 2023-02-01: qty 1

## 2023-02-01 MED ORDER — SODIUM CHLORIDE 0.9 % IV SOLN: Freq: Once | INTRAVENOUS | Status: AC

## 2023-02-01 MED ORDER — MAGNESIUM SULFATE 2 GM/50ML IV SOLN
2.0000 g | Freq: Once | INTRAVENOUS | Status: AC
  Administered 2023-02-01: 2 g via INTRAVENOUS
  Filled 2023-02-01: qty 50

## 2023-02-01 MED ORDER — PALONOSETRON HCL INJECTION 0.25 MG/5ML
0.2500 mg | Freq: Once | INTRAVENOUS | Status: AC
  Administered 2023-02-01: 0.25 mg via INTRAVENOUS
  Filled 2023-02-01: qty 5

## 2023-02-01 MED ORDER — SODIUM CHLORIDE 0.9 % IV SOLN
100.0000 mg/m2 | Freq: Once | INTRAVENOUS | Status: AC
  Administered 2023-02-01: 210 mg via INTRAVENOUS
  Filled 2023-02-01: qty 35

## 2023-02-01 MED ORDER — SODIUM CHLORIDE 0.9 % IV SOLN
1200.0000 mg | Freq: Once | INTRAVENOUS | Status: AC
  Administered 2023-02-01: 1200 mg via INTRAVENOUS
  Filled 2023-02-01: qty 20

## 2023-02-01 MED ORDER — SODIUM CHLORIDE 0.9 % IV SOLN
179.4000 mg | Freq: Once | INTRAVENOUS | Status: AC
  Administered 2023-02-01: 180 mg via INTRAVENOUS
  Filled 2023-02-01: qty 18

## 2023-02-01 MED ORDER — FAMOTIDINE IN NACL 20-0.9 MG/50ML-% IV SOLN
20.0000 mg | Freq: Once | INTRAVENOUS | Status: AC
  Administered 2023-02-01: 20 mg via INTRAVENOUS
  Filled 2023-02-01: qty 50

## 2023-02-01 MED ORDER — SODIUM CHLORIDE 0.9 % IV SOLN
150.0000 mg | Freq: Once | INTRAVENOUS | Status: AC
  Administered 2023-02-01: 150 mg via INTRAVENOUS
  Filled 2023-02-01: qty 150

## 2023-02-01 MED ORDER — MAGNESIUM SULFATE 2 GM/50ML IV SOLN: 2.0000 g | Freq: Once | INTRAVENOUS | Status: DC

## 2023-02-01 MED ORDER — HEPARIN SOD (PORK) LOCK FLUSH 100 UNIT/ML IV SOLN: 500.0000 [IU] | Freq: Once | INTRAVENOUS | Status: DC | PRN

## 2023-02-01 MED ORDER — SODIUM CHLORIDE 0.9 % IV SOLN
10.0000 mg | Freq: Once | INTRAVENOUS | Status: AC
  Administered 2023-02-01: 10 mg via INTRAVENOUS
  Filled 2023-02-01: qty 10

## 2023-02-01 MED ORDER — POTASSIUM CHLORIDE 10 MEQ/100ML IV SOLN
10.0000 meq | INTRAVENOUS | Status: DC
  Administered 2023-02-01 (×2): 10 meq via INTRAVENOUS
  Filled 2023-02-01: qty 100

## 2023-02-01 NOTE — Patient Instructions (Signed)
[place]   Discharge Instructions: Thank you for choosing [facility] to provide your oncology and hematology care.   If you have a lab appointment with the [facility], please go directly to the [facility] and check in at the registration area.   Wear comfortable clothing and clothing appropriate for easy access to any Portacath or PICC line.   We strive to give you quality time with your provider. You may need to reschedule your appointment if you arrive late (15 or more minutes).  Arriving late affects you and other patients whose appointments are after yours.  Also, if you miss three or more appointments without notifying the office, you may be dismissed from the clinic at the provider's discretion.      For prescription refill requests, have your pharmacy contact our office and allow 72 hours for refills to be completed.    Today you received the following chemotherapy and/or immunotherapy agents Atezolizumab (TECENTRIQ), Paclitaxel (TAXOL) & Carboplatin (PARAPLATIN).      To help prevent nausea and vomiting after your treatment, we encourage you to take your nausea medication as directed.  BELOW ARE SYMPTOMS THAT SHOULD BE REPORTED IMMEDIATELY: *FEVER GREATER THAN 100.4 F (38 C) OR HIGHER *CHILLS OR SWEATING *NAUSEA AND VOMITING THAT IS NOT CONTROLLED WITH YOUR NAUSEA MEDICATION *UNUSUAL SHORTNESS OF BREATH *UNUSUAL BRUISING OR BLEEDING *URINARY PROBLEMS (pain or burning when urinating, or frequent urination) *BOWEL PROBLEMS (unusual diarrhea, constipation, pain near the anus) TENDERNESS IN MOUTH AND THROAT WITH OR WITHOUT PRESENCE OF ULCERS (sore throat, sores in mouth, or a toothache) UNUSUAL RASH, SWELLING OR PAIN  UNUSUAL VAGINAL DISCHARGE OR ITCHING   Items with * indicate a potential emergency and should be followed up as soon as possible or go to the Emergency Department if any problems should occur.  Please show  the CHEMOTHERAPY ALERT CARD or IMMUNOTHERAPY ALERT CARD at check-in to the Emergency Department and triage nurse.  Should you have questions after your visit or need to cancel or reschedule your appointment, please contact [facility]  Dept: [phone]  and follow the prompts.  Office hours are 8:00 a.m. to 4:30 p.m. Monday - Friday. Please note that voicemails left after 4:00 p.m. may not be returned until the following business day.  We are closed weekends and major holidays. You have access to a nurse at all times for urgent questions. Please call the main number to the clinic Dept: [phone] and follow the prompts.   For any non-urgent questions, you may also contact your provider using MyChart. We now offer e-Visits for anyone 57 and older to request care online for non-urgent symptoms. For details visit [email or web address].   Also download the MyChart app! Go to the app store, search "MyChart", open the app, select [facility], and log in with your MyChart username and password.  Atezolizumab Injection What is this medication? ATEZOLIZUMAB (a te zoe LIZ ue mab) treats some types of cancer. It works by helping your immune system slow or stop the spread of cancer cells. It is a monoclonal antibody. This medicine may be used for other purposes; ask your health care provider or pharmacist if you have questions. COMMON BRAND NAME(S): Tecentriq What should I tell my care team before I take this medication? They need to know if you have any of these conditions: Allogeneic stem cell transplant (uses someone else's stem cells) Autoimmune diseases, such as Crohn disease, ulcerative colitis,  lupus History of chest radiation Nervous system problems, such as Guillain-Barre syndrome, myasthenia gravis Organ transplant An unusual or allergic reaction to atezolizumab, other medications, foods, dyes, or preservatives Pregnant or trying to get  pregnant Breast-feeding How should I use this medication? This medication is injected into a vein. It is given by your care team in a hospital or clinic setting. A special MedGuide will be given to you before each treatment. Be sure to read this information carefully each time. Talk to your care team about the use of this medication in children. While it may be prescribed for children as young as 2 years for selected conditions, precautions do apply. Overdosage: If you think you have taken too much of this medicine contact a poison control center or emergency room at once. NOTE: This medicine is only for you. Do not share this medicine with others. What if I miss a dose? Keep appointments for follow-up doses. It is important not to miss your dose. Call your care team if you are unable to keep an appointment. What may interact with this medication? Interactions have not been studied. This list may not describe all possible interactions. Give your health care provider a list of all the medicines, herbs, non-prescription drugs, or dietary supplements you use. Also tell them if you smoke, drink alcohol, or use illegal drugs. Some items may interact with your medicine. What should I watch for while using this medication? Your condition will be monitored carefully while you are receiving this medication. You may need blood work while taking this medication. This medication may cause serious skin reactions. They can happen weeks to months after starting the medication. Contact your care team right away if you notice fevers or flu-like symptoms with a rash. The rash may be red or purple and then turn into blisters or peeling of the skin. You may also notice a red rash with swelling of the face, lips, or lymph nodes in your neck or under your arms. Tell your care team right away if you have any change in your eyesight. Talk to your care team if you may be pregnant. Serious birth defects can occur if you take  this medication during pregnancy and for 5 months after the last dose. You will need a negative pregnancy test before starting this medication. Contraception is recommended while taking this medication and for 5 months after the last dose. Your care team can help you find the option that works for you. Do not breastfeed while taking this medication and for at least 5 months after the last dose. What side effects may I notice from receiving this medication? Side effects that you should report to your doctor or health care professional as soon as possible: Allergic reactions--skin rash, itching, hives, swelling of the face, lips, tongue, or throat Dry cough, shortness of breath or trouble breathing Eye pain, redness, irritation, or discharge with blurry or decreased vision Heart muscle inflammation--unusual weakness or fatigue, shortness of breath, chest pain, fast or irregular heartbeat, dizziness, swelling of the ankles, feet, or hands Hormone gland problems--headache, sensitivity to light, unusual weakness or fatigue, dizziness, fast or irregular heartbeat, increased sensitivity to cold or heat, excessive sweating, constipation, hair loss, increased thirst or amount of urine, tremors or shaking, irritability Infusion reactions--chest pain, shortness of breath or trouble breathing, feeling faint or lightheaded Kidney injury (glomerulonephritis)--decrease in the amount of urine, red or dark brown urine, foamy or bubbly urine, swelling of the ankles, hands, or feet Liver injury--right upper  belly pain, loss of appetite, nausea, light-colored stool, dark yellow or brown urine, yellowing skin or eyes, unusual weakness or fatigue Pain, tingling, or numbness in the hands or feet, muscle weakness, change in vision, confusion or trouble speaking, loss of balance or coordination, trouble walking, seizures Rash, fever, and swollen lymph nodes Redness, blistering, peeling, or loosening of the skin, including  inside the mouth Sudden or severe stomach pain, bloody diarrhea, fever, nausea, vomiting Side effects that usually do not require medical attention (report to your doctor or health care professional if they continue or are bothersome): Bone, joint, or muscle pain Diarrhea Fatigue Loss of appetite Nausea Skin rash This list may not describe all possible side effects. Call your doctor for medical advice about side effects. You may report side effects to FDA at 1-800-FDA-1088. Where should I keep my medication? This medication is given in a hospital or clinic. It will not be stored at home. NOTE: This sheet is a summary. It may not cover all possible information. If you have questions about this medicine, talk to your doctor, pharmacist, or health care provider.  2023 Elsevier/Gold Standard (2022-01-30 00:00:00)  Paclitaxel Injection What is this medication? PACLITAXEL (PAK li TAX el) treats some types of cancer. It works by slowing down the growth of cancer cells. This medicine may be used for other purposes; ask your health care provider or pharmacist if you have questions. COMMON BRAND NAME(S): Onxol, Taxol What should I tell my care team before I take this medication? They need to know if you have any of these conditions: Heart disease Liver disease Low white blood cell levels An unusual or allergic reaction to paclitaxel, other medications, foods, dyes, or preservatives If you or your partner are pregnant or trying to get pregnant Breast-feeding How should I use this medication? This medication is injected into a vein. It is given by your care team in a hospital or clinic setting. Talk to your care team about the use of this medication in children. While it may be given to children for selected conditions, precautions do apply. Overdosage: If you think you have taken too much of this medicine contact a poison control center or emergency room at once. NOTE: This medicine is only for  you. Do not share this medicine with others. What if I miss a dose? Keep appointments for follow-up doses. It is important not to miss your dose. Call your care team if you are unable to keep an appointment. What may interact with this medication? Do not take this medication with any of the following: Live virus vaccines Other medications may affect the way this medication works. Talk with your care team about all of the medications you take. They may suggest changes to your treatment plan to lower the risk of side effects and to make sure your medications work as intended. This list may not describe all possible interactions. Give your health care provider a list of all the medicines, herbs, non-prescription drugs, or dietary supplements you use. Also tell them if you smoke, drink alcohol, or use illegal drugs. Some items may interact with your medicine. What should I watch for while using this medication? Your condition will be monitored carefully while you are receiving this medication. You may need blood work while taking this medication. This medication may make you feel generally unwell. This is not uncommon as chemotherapy can affect healthy cells as well as cancer cells. Report any side effects. Continue your course of treatment even though you  feel ill unless your care team tells you to stop. This medication can cause serious allergic reactions. To reduce the risk, your care team may give you other medications to take before receiving this one. Be sure to follow the directions from your care team. This medication may increase your risk of getting an infection. Call your care team for advice if you get a fever, chills, sore throat, or other symptoms of a cold or flu. Do not treat yourself. Try to avoid being around people who are sick. This medication may increase your risk to bruise or bleed. Call your care team if you notice any unusual bleeding. Be careful brushing or flossing your teeth or  using a toothpick because you may get an infection or bleed more easily. If you have any dental work done, tell your dentist you are receiving this medication. Talk to your care team if you may be pregnant. Serious birth defects can occur if you take this medication during pregnancy. Talk to your care team before breastfeeding. Changes to your treatment plan may be needed. What side effects may I notice from receiving this medication? Side effects that you should report to your care team as soon as possible: Allergic reactions--skin rash, itching, hives, swelling of the face, lips, tongue, or throat Heart rhythm changes--fast or irregular heartbeat, dizziness, feeling faint or lightheaded, chest pain, trouble breathing Increase in blood pressure Infection--fever, chills, cough, sore throat, wounds that don't heal, pain or trouble when passing urine, general feeling of discomfort or being unwell Low blood pressure--dizziness, feeling faint or lightheaded, blurry vision Low red blood cell level--unusual weakness or fatigue, dizziness, headache, trouble breathing Painful swelling, warmth, or redness of the skin, blisters or sores at the infusion site Pain, tingling, or numbness in the hands or feet Slow heartbeat--dizziness, feeling faint or lightheaded, confusion, trouble breathing, unusual weakness or fatigue Unusual bruising or bleeding Side effects that usually do not require medical attention (report to your care team if they continue or are bothersome): Diarrhea Hair loss Joint pain Loss of appetite Muscle pain Nausea Vomiting This list may not describe all possible side effects. Call your doctor for medical advice about side effects. You may report side effects to FDA at 1-800-FDA-1088. Where should I keep my medication? This medication is given in a hospital or clinic. It will not be stored at home. NOTE: This sheet is a summary. It may not cover all possible information. If you have  questions about this medicine, talk to your doctor, pharmacist, or health care provider.  2023 Elsevier/Gold Standard (2022-02-23 00:00:00)  Carboplatin Injection What is this medication? CARBOPLATIN (KAR boe pla tin) treats some types of cancer. It works by slowing down the growth of cancer cells. This medicine may be used for other purposes; ask your health care provider or pharmacist if you have questions. COMMON BRAND NAME(S): Paraplatin What should I tell my care team before I take this medication? They need to know if you have any of these conditions: Blood disorders Hearing problems Kidney disease Recent or ongoing radiation therapy An unusual or allergic reaction to carboplatin, cisplatin, other medications, foods, dyes, or preservatives Pregnant or trying to get pregnant Breast-feeding How should I use this medication? This medication is injected into a vein. It is given by your care team in a hospital or clinic setting. Talk to your care team about the use of this medication in children. Special care may be needed. Overdosage: If you think you have taken too much of  this medicine contact a poison control center or emergency room at once. NOTE: This medicine is only for you. Do not share this medicine with others. What if I miss a dose? Keep appointments for follow-up doses. It is important not to miss your dose. Call your care team if you are unable to keep an appointment. What may interact with this medication? Medications for seizures Some antibiotics, such as amikacin, gentamicin, neomycin, streptomycin, tobramycin Vaccines This list may not describe all possible interactions. Give your health care provider a list of all the medicines, herbs, non-prescription drugs, or dietary supplements you use. Also tell them if you smoke, drink alcohol, or use illegal drugs. Some items may interact with your medicine. What should I watch for while using this medication? Your condition  will be monitored carefully while you are receiving this medication. You may need blood work while taking this medication. This medication may make you feel generally unwell. This is not uncommon, as chemotherapy can affect healthy cells as well as cancer cells. Report any side effects. Continue your course of treatment even though you feel ill unless your care team tells you to stop. In some cases, you may be given additional medications to help with side effects. Follow all directions for their use. This medication may increase your risk of getting an infection. Call your care team for advice if you get a fever, chills, sore throat, or other symptoms of a cold or flu. Do not treat yourself. Try to avoid being around people who are sick. Avoid taking medications that contain aspirin, acetaminophen, ibuprofen, naproxen, or ketoprofen unless instructed by your care team. These medications may hide a fever. Be careful brushing or flossing your teeth or using a toothpick because you may get an infection or bleed more easily. If you have any dental work done, tell your dentist you are receiving this medication. Talk to your care team if you wish to become pregnant or think you might be pregnant. This medication can cause serious birth defects. Talk to your care team about effective forms of contraception. Do not breast-feed while taking this medication. What side effects may I notice from receiving this medication? Side effects that you should report to your care team as soon as possible: Allergic reactions--skin rash, itching, hives, swelling of the face, lips, tongue, or throat Infection--fever, chills, cough, sore throat, wounds that don't heal, pain or trouble when passing urine, general feeling of discomfort or being unwell Low red blood cell level--unusual weakness or fatigue, dizziness, headache, trouble breathing Pain, tingling, or numbness in the hands or feet, muscle weakness, change in vision,  confusion or trouble speaking, loss of balance or coordination, trouble walking, seizures Unusual bruising or bleeding Side effects that usually do not require medical attention (report to your care team if they continue or are bothersome): Hair loss Nausea Unusual weakness or fatigue Vomiting This list may not describe all possible side effects. Call your doctor for medical advice about side effects. You may report side effects to FDA at 1-800-FDA-1088. Where should I keep my medication? This medication is given in a hospital or clinic. It will not be stored at home. NOTE: This sheet is a summary. It may not cover all possible information. If you have questions about this medicine, talk to your doctor, pharmacist, or health care provider.  2023 Elsevier/Gold Standard (2007-11-30 00:00:00)  Magnesium Sulfate Injection What is this medication? MAGNESIUM SULFATE (mag NEE zee um SUL fate) prevents and treats low levels of magnesium  in your body. It may also be used to prevent and treat seizures during pregnancy in people with high blood pressure disorders, such as preeclampsia or eclampsia. Magnesium plays an important role in maintaining the health of your muscles and nervous system. This medicine may be used for other purposes; ask your health care provider or pharmacist if you have questions. What should I tell my care team before I take this medication? They need to know if you have any of these conditions: Heart disease History of irregular heart beat Kidney disease An unusual or allergic reaction to magnesium sulfate, medications, foods, dyes, or preservatives Pregnant or trying to get pregnant Breast-feeding How should I use this medication? This medication is for infusion into a vein. It is given in a hospital or clinic setting. Talk to your care team about the use of this medication in children. While this medication may be prescribed for selected conditions, precautions do  apply. Overdosage: If you think you have taken too much of this medicine contact a poison control center or emergency room at once. NOTE: This medicine is only for you. Do not share this medicine with others. What if I miss a dose? This does not apply. What may interact with this medication? Certain medications for anxiety or sleep Certain medications for seizures, such phenobarbital Digoxin Medications that relax muscles for surgery Narcotic medications for pain This list may not describe all possible interactions. Give your health care provider a list of all the medicines, herbs, non-prescription drugs, or dietary supplements you use. Also tell them if you smoke, drink alcohol, or use illegal drugs. Some items may interact with your medicine. What should I watch for while using this medication? Your condition will be monitored carefully while you are receiving this medication. You may need blood work done while you are receiving this medication. What side effects may I notice from receiving this medication? Side effects that you should report to your care team as soon as possible: Allergic reactions--skin rash, itching, hives, swelling of the face, lips, tongue, or throat High magnesium level--confusion, drowsiness, facial flushing, redness, sweating, muscle weakness, fast or irregular heartbeat, trouble breathing Low blood pressure--dizziness, feeling faint or lightheaded, blurry vision Side effects that usually do not require medical attention (report to your care team if they continue or are bothersome): Headache Nausea This list may not describe all possible side effects. Call your doctor for medical advice about side effects. You may report side effects to FDA at 1-800-FDA-1088. Where should I keep my medication? This medication is given in a hospital or clinic and will not be stored at home. NOTE: This sheet is a summary. It may not cover all possible information. If you have  questions about this medicine, talk to your doctor, pharmacist, or health care provider.  2023 Elsevier/Gold Standard (2013-02-14 00:00:00)  Potassium Chloride Injection What is this medication? POTASSIUM CHLORIDE (poe TASS i um KLOOR ide) prevents and treats low levels of potassium in your body. Potassium plays an important role in maintaining the health of your kidneys, heart, muscles, and nervous system. This medicine may be used for other purposes; ask your health care provider or pharmacist if you have questions. COMMON BRAND NAME(S): PROAMP What should I tell my care team before I take this medication? They need to know if you have any of these conditions: Addison disease Dehydration Diabetes (high blood sugar) Heart disease High levels of potassium in the blood Irregular heartbeat or rhythm Kidney disease Large areas of burned  skin An unusual or allergic reaction to potassium, other medications, foods, dyes, or preservatives Pregnant or trying to get pregnant Breast-feeding How should I use this medication? This medication is injected into a vein. It is given in a hospital or clinic setting. Talk to your care team about the use of this medication in children. Special care may be needed. Overdosage: If you think you have taken too much of this medicine contact a poison control center or emergency room at once. NOTE: This medicine is only for you. Do not share this medicine with others. What if I miss a dose? This does not apply. This medication is not for regular use. What may interact with this medication? Do not take this medication with any of the following: Certain diuretics, such as spironolactone, triamterene Eplerenone Sodium polystyrene sulfonate This medication may also interact with the following: Certain medications for blood pressure or heart disease, such as lisinopril, losartan, quinapril, valsartan Medications that lower your chance of fighting infection, such  as cyclosporine, tacrolimus NSAIDs, medications for pain and inflammation, such as ibuprofen or naproxen Other potassium supplements Salt substitutes This list may not describe all possible interactions. Give your health care provider a list of all the medicines, herbs, non-prescription drugs, or dietary supplements you use. Also tell them if you smoke, drink alcohol, or use illegal drugs. Some items may interact with your medicine. What should I watch for while using this medication? Visit your care team for regular checks on your progress. Tell your care team if your symptoms do not start to get better or if they get worse. You may need blood work while you are taking this medication. Avoid salt substitutes unless you are told otherwise by your care team. What side effects may I notice from receiving this medication? Side effects that you should report to your care team as soon as possible: Allergic reactions--skin rash, itching, hives, swelling of the face, lips, tongue, or throat High potassium level--muscle weakness, fast or irregular heartbeat Side effects that usually do not require medical attention (report to your care team if they continue or are bothersome): Diarrhea Nausea Stomach pain Vomiting This list may not describe all possible side effects. Call your doctor for medical advice about side effects. You may report side effects to FDA at 1-800-FDA-1088. Where should I keep my medication? This medication is given in a hospital or clinic. It will not be stored at home. NOTE: This sheet is a summary. It may not cover all possible information. If you have questions about this medicine, talk to your doctor, pharmacist, or health care provider.  2023 Elsevier/Gold Standard (2007-11-30 00:00:00)

## 2023-02-02 ENCOUNTER — Other Ambulatory Visit: Payer: Self-pay

## 2023-02-02 ENCOUNTER — Inpatient Hospital Stay: Payer: No Typology Code available for payment source

## 2023-02-02 VITALS — BP 127/96 | HR 81 | Temp 97.1°F | Resp 18

## 2023-02-02 DIAGNOSIS — Z5112 Encounter for antineoplastic immunotherapy: Secondary | ICD-10-CM | POA: Diagnosis not present

## 2023-02-02 DIAGNOSIS — C3490 Malignant neoplasm of unspecified part of unspecified bronchus or lung: Secondary | ICD-10-CM

## 2023-02-02 DIAGNOSIS — C342 Malignant neoplasm of middle lobe, bronchus or lung: Secondary | ICD-10-CM

## 2023-02-02 LAB — T4: T4, Total: 4.9 ug/dL (ref 4.5–12.0)

## 2023-02-02 MED ORDER — PEGFILGRASTIM-JMDB 6 MG/0.6ML ~~LOC~~ SOSY
6.0000 mg | PREFILLED_SYRINGE | Freq: Once | SUBCUTANEOUS | Status: AC
  Administered 2023-02-02: 6 mg via SUBCUTANEOUS
  Filled 2023-02-02: qty 0.6

## 2023-02-02 NOTE — Patient Instructions (Signed)

## 2023-02-04 ENCOUNTER — Other Ambulatory Visit: Payer: Self-pay

## 2023-02-06 ENCOUNTER — Ambulatory Visit: Payer: No Typology Code available for payment source | Admitting: Diagnostic Neuroimaging

## 2023-02-07 ENCOUNTER — Telehealth: Payer: Self-pay

## 2023-02-07 ENCOUNTER — Other Ambulatory Visit: Payer: Self-pay

## 2023-02-07 ENCOUNTER — Inpatient Hospital Stay: Payer: No Typology Code available for payment source

## 2023-02-07 DIAGNOSIS — Z452 Encounter for adjustment and management of vascular access device: Secondary | ICD-10-CM

## 2023-02-07 DIAGNOSIS — C342 Malignant neoplasm of middle lobe, bronchus or lung: Secondary | ICD-10-CM

## 2023-02-07 DIAGNOSIS — Z5112 Encounter for antineoplastic immunotherapy: Secondary | ICD-10-CM | POA: Diagnosis not present

## 2023-02-07 DIAGNOSIS — C3490 Malignant neoplasm of unspecified part of unspecified bronchus or lung: Secondary | ICD-10-CM

## 2023-02-07 LAB — CBC WITH DIFFERENTIAL (CANCER CENTER ONLY)
Abs Immature Granulocytes: 0 10*3/uL (ref 0.00–0.07)
Band Neutrophils: 8 %
Basophils Absolute: 0 10*3/uL (ref 0.0–0.1)
Basophils Relative: 2 %
Eosinophils Absolute: 0 10*3/uL (ref 0.0–0.5)
Eosinophils Relative: 0 %
HCT: 25.9 % — ABNORMAL LOW (ref 36.0–46.0)
Hemoglobin: 8.1 g/dL — ABNORMAL LOW (ref 12.0–15.0)
Lymphocytes Relative: 34 %
Lymphs Abs: 0.4 10*3/uL — ABNORMAL LOW (ref 0.7–4.0)
MCH: 29.3 pg (ref 26.0–34.0)
MCHC: 31.3 g/dL (ref 30.0–36.0)
MCV: 93.8 fL (ref 80.0–100.0)
Metamyelocytes Relative: 2 %
Monocytes Absolute: 0.1 10*3/uL (ref 0.1–1.0)
Monocytes Relative: 6 %
Neutro Abs: 0.6 10*3/uL — ABNORMAL LOW (ref 1.7–7.7)
Neutrophils Relative %: 48 %
Platelet Count: 39 10*3/uL — ABNORMAL LOW (ref 150–400)
RBC: 2.76 MIL/uL — ABNORMAL LOW (ref 3.87–5.11)
RDW: 18.7 % — ABNORMAL HIGH (ref 11.5–15.5)
WBC Count: 1.1 10*3/uL — ABNORMAL LOW (ref 4.0–10.5)
nRBC: 0 % (ref 0.0–0.2)

## 2023-02-07 LAB — MAGNESIUM: Magnesium: 1 mg/dL — ABNORMAL LOW (ref 1.7–2.4)

## 2023-02-07 LAB — BASIC METABOLIC PANEL - CANCER CENTER ONLY
Anion gap: 8 (ref 5–15)
BUN: 16 mg/dL (ref 8–23)
CO2: 31 mmol/L (ref 22–32)
Calcium: 7.2 mg/dL — ABNORMAL LOW (ref 8.9–10.3)
Chloride: 102 mmol/L (ref 98–111)
Creatinine: 0.99 mg/dL (ref 0.44–1.00)
GFR, Estimated: 59 mL/min — ABNORMAL LOW (ref >60)
Glucose, Bld: 155 mg/dL — ABNORMAL HIGH (ref 70–99)
Potassium: 3.1 mmol/L — ABNORMAL LOW (ref 3.5–5.1)
Sodium: 141 mmol/L (ref 135–145)

## 2023-02-07 MED ORDER — MAGNESIUM SULFATE 4 GM/100ML IV SOLN
4.0000 g | Freq: Once | INTRAVENOUS | Status: AC
  Administered 2023-02-07: 4 g via INTRAVENOUS
  Filled 2023-02-07: qty 100

## 2023-02-07 MED ORDER — HEPARIN SOD (PORK) LOCK FLUSH 100 UNIT/ML IV SOLN
250.0000 [IU] | INTRAVENOUS | Status: AC | PRN
  Administered 2023-02-07: 250 [IU]

## 2023-02-07 MED ORDER — SODIUM CHLORIDE 0.9% FLUSH
10.0000 mL | Freq: Once | INTRAVENOUS | Status: AC
  Administered 2023-02-07: 10 mL via INTRAVENOUS

## 2023-02-07 MED ORDER — SODIUM CHLORIDE 0.9% FLUSH
10.0000 mL | INTRAVENOUS | Status: AC | PRN
  Administered 2023-02-07: 10 mL

## 2023-02-07 MED ORDER — HEPARIN SOD (PORK) LOCK FLUSH 100 UNIT/ML IV SOLN
500.0000 [IU] | Freq: Once | INTRAVENOUS | Status: AC
  Administered 2023-02-07: 500 [IU] via INTRAVENOUS

## 2023-02-07 MED ORDER — POTASSIUM CHLORIDE 10 MEQ/100ML IV SOLN
10.0000 meq | INTRAVENOUS | Status: AC
  Administered 2023-02-07 (×2): 10 meq via INTRAVENOUS
  Filled 2023-02-07: qty 100

## 2023-02-07 MED ORDER — SODIUM CHLORIDE 0.9 % IV SOLN: INTRAVENOUS | Status: DC

## 2023-02-07 NOTE — Patient Instructions (Signed)
PICC Home Care Guide A peripherally inserted central catheter (PICC) is a form of IV access that allows medicines and IV fluids to be quickly put into the blood and spread throughout the body. The PICC is a long, thin, flexible tube (catheter) that is put into a vein in a person's arm or leg. The catheter ends in a large vein just outside the heart called the superior vena cava (SVC). After the PICC is put in, a chest X-ray may be done to make sure that it is in the right place. A PICC may be placed for different reasons, such as: To give medicines and liquid nutrition. To give IV fluids and blood products. To take blood samples often. If there is trouble placing a peripheral intravenous (PIV) catheter. If cared for properly, a PICC can remain in place for many months. Having a PICC can allow you to go home from the hospital sooner and continue treatment at home. Medicines and PICC care can be managed at home by a family member, caregiver, or home health care team. What are the risks? Generally, having a PICC is safe. However, problems may occur, including: A blood clot (thrombus) forming in or at the end of the PICC. A blood clot forming in a vein (deep vein thrombosis) or traveling to the lung (pulmonary embolism). Inflammation of the vein (phlebitis) in which the PICC is placed. Infection at the insertion site or in the blood. Blood infections from central lines, like PICCs, can be serious and often require a hospital stay. PICC malposition, or PICC movement or poor placement. A break or cut in the PICC. Do not use scissors near the PICC. Nerve or tendon irritation or injury during PICC insertion. How to care for your PICC Please follow the specific guidelines provided by your health care provider. Preventing infection You and any caregivers should wash your hands often with soap and water for at least 20 seconds. Wash hands: Before touching the PICC or the infusion device. Before changing a  bandage (dressing). Do not change the dressing unless you have been taught to do so and have shown you are able to change it safely. Flush the PICC as told. Tell your health care provider right away if the PICC is hard to flush or does not flush. Do not use force to flush the PICC. Use clean and germ-free (sterile) supplies only. Keep the supplies in a dry place. Do not reuse needles, syringes, or any other supplies. Reusing supplies can lead to infection. Keep the PICC dressing dry and secure it with tape if the edges stop sticking to your skin. Check your PICC insertion site every day for signs of infection. Check for: Redness, swelling, or pain. Fluid or blood. Warmth. Pus or a bad smell. Preventing other problems Do not use a syringe that is less than 10 mL to flush the PICC. Do not have your blood pressure checked on the arm in which the PICC is placed. Do not ever pull or tug on the PICC. Keep it secured to your arm with tape or a stretch wrap when not in use. Do not take the PICC out yourself. Only a trained health care provider should remove the PICC. Keep pets and children away from your PICC. How to care for your PICC dressing Keep your PICC dressing clean and dry to prevent infection. Do not take baths, swim, or use a hot tub until your health care provider approves. Ask your health care provider if you can take   showers. You may only be allowed to take sponge baths. When you are allowed to shower: Ask your health care provider to teach you how to wrap the PICC. Cover the PICC with clear plastic wrap and tape to keep it dry while showering. Follow instructions from your health care provider about how to take care of your insertion site and dressing. Make sure you: Wash your hands with soap and water for at least 20 seconds before and after you change your dressing. If soap and water are not available, use hand sanitizer. Change your dressing only if taught to do so by your health care  provider. Your PICC dressing needs to be changed if it becomes loose or wet. Leave stitches (sutures), skin glue, or adhesive strips in place. These skin closures may need to stay in place for 2 weeks or longer. If adhesive strip edges start to loosen and curl up, you may trim the loose edges. Do not remove adhesive strips completely unless your health care provider tells you to do that. Follow these instructions at home: Disposal of supplies Throw away any syringes in a disposal container that is meant for sharp items (sharps container). You can buy a sharps container from a pharmacy, or you can make one by using an empty, hard plastic bottle with a lid. Place any used dressings or infusion bags into a plastic bag. Throw that bag in the trash. General instructions  Always carry your PICC identification card or wear a medical alert bracelet. Keep the tube clamped at all times, unless it is being used. Always carry a smooth-edge clamp with you to clamp the PICC if it breaks. Do not use scissors or sharp objects near the tube. You may bend your arm and move it freely. If your PICC is near or at the bend of your elbow, avoid activity with repeated motion at the elbow. Avoid lifting heavy objects as told by your health care provider. Keep all follow-up visits. This is important. You will need to have your PICC dressing changed at least once a week. Contact a health care provider if: You have pain in your arm, ear, face, or teeth. You have a fever or chills. You have redness, swelling, or pain around the insertion site. You have fluid or blood coming from the insertion site. Your insertion site feels warm to the touch. You have pus or a bad smell coming from the insertion site. Your skin feels hard and raised around the insertion site. Your PICC dressing has gotten wet or is coming off and you have not been taught how to change it. Get help right away if: You have problems with your PICC, such as  your PICC: Was tugged or pulled and has partially come out. Do not  push the PICC back in. Cannot be flushed, is hard to flush, or leaks around the insertion site when it is flushed. Makes a flushing sound when it is flushed. Appears to have a hole or tear. Is accidentally pulled all the way out. If this happens, cover the insertion site with a gauze dressing. Do not throw the PICC away. Your health care provider will need to check it to be sure the entire catheter came out. You feel your heart racing or skipping beats, or you have chest pain. You have shortness of breath or trouble breathing. You have swelling, redness, warmth, or pain in the arm in which the PICC is placed. You have a red streak going up your arm that   starts under the PICC dressing. These symptoms may be an emergency. Get help right away. Call 911. Do not wait to see if the symptoms will go away. Do not drive yourself to the hospital. Summary A peripherally inserted central catheter (PICC) is a long, thin, flexible tube (catheter) that is put into a vein in the arm or leg. If cared for properly, a PICC can remain in place for many months. Having a PICC can allow you to go home from the hospital sooner and continue treatment at home. The PICC is inserted using a germ-free (sterile) technique by a specially trained health care provider. Only a trained health care provider should remove it. Do not have your blood pressure checked on the arm in which your PICC is placed. Always keep your PICC identification card with you. This information is not intended to replace advice given to you by your health care provider. Make sure you discuss any questions you have with your health care provider. Document Revised: 04/27/2021 Document Reviewed: 04/27/2021 Elsevier Patient Education  2023 Elsevier Inc.  

## 2023-02-07 NOTE — Telephone Encounter (Signed)
Patient gave verbal understanding and had no further questions or concerns  

## 2023-02-07 NOTE — Telephone Encounter (Signed)
-----   Message from Ladene Artist, MD sent at 02/07/2023  1:08 PM EDT ----- Please call patient, white count and platelets are low, call for any fever or bleeding, repeat CBC with blood bank sample 02/08/2023, transfuse for symptomatic anemia

## 2023-02-07 NOTE — Patient Instructions (Signed)
Magnesium Sulfate Injection What is this medication? MAGNESIUM SULFATE (mag NEE zee um SUL fate) prevents and treats low levels of magnesium in your body. It may also be used to prevent and treat seizures during pregnancy in people with high blood pressure disorders, such as preeclampsia or eclampsia. Magnesium plays an important role in maintaining the health of your muscles and nervous system. This medicine may be used for other purposes; ask your health care provider or pharmacist if you have questions. What should I tell my care team before I take this medication? They need to know if you have any of these conditions: Heart disease History of irregular heart beat Kidney disease An unusual or allergic reaction to magnesium sulfate, medications, foods, dyes, or preservatives Pregnant or trying to get pregnant Breast-feeding How should I use this medication? This medication is for infusion into a vein. It is given in a hospital or clinic setting. Talk to your care team about the use of this medication in children. While this medication may be prescribed for selected conditions, precautions do apply. Overdosage: If you think you have taken too much of this medicine contact a poison control center or emergency room at once. NOTE: This medicine is only for you. Do not share this medicine with others. What if I miss a dose? This does not apply. What may interact with this medication? Certain medications for anxiety or sleep Certain medications for seizures, such phenobarbital Digoxin Medications that relax muscles for surgery Narcotic medications for pain This list may not describe all possible interactions. Give your health care provider a list of all the medicines, herbs, non-prescription drugs, or dietary supplements you use. Also tell them if you smoke, drink alcohol, or use illegal drugs. Some items may interact with your medicine. What should I watch for while using this  medication? Your condition will be monitored carefully while you are receiving this medication. You may need blood work done while you are receiving this medication. What side effects may I notice from receiving this medication? Side effects that you should report to your care team as soon as possible: Allergic reactions--skin rash, itching, hives, swelling of the face, lips, tongue, or throat High magnesium level--confusion, drowsiness, facial flushing, redness, sweating, muscle weakness, fast or irregular heartbeat, trouble breathing Low blood pressure--dizziness, feeling faint or lightheaded, blurry vision Side effects that usually do not require medical attention (report to your care team if they continue or are bothersome): Headache Nausea This list may not describe all possible side effects. Call your doctor for medical advice about side effects. You may report side effects to FDA at 1-800-FDA-1088. Where should I keep my medication? This medication is given in a hospital or clinic and will not be stored at home. NOTE: This sheet is a summary. It may not cover all possible information. If you have questions about this medicine, talk to your doctor, pharmacist, or health care provider.  2023 Elsevier/Gold Standard (2013-02-14 00:00:00)   Potassium Chloride Injection What is this medication? POTASSIUM CHLORIDE (poe TASS i um KLOOR ide) prevents and treats low levels of potassium in your body. Potassium plays an important role in maintaining the health of your kidneys, heart, muscles, and nervous system. This medicine may be used for other purposes; ask your health care provider or pharmacist if you have questions. COMMON BRAND NAME(S): PROAMP What should I tell my care team before I take this medication? They need to know if you have any of these conditions: Addison disease Dehydration Diabetes (  high blood sugar) Heart disease High levels of potassium in the blood Irregular  heartbeat or rhythm Kidney disease Large areas of burned skin An unusual or allergic reaction to potassium, other medications, foods, dyes, or preservatives Pregnant or trying to get pregnant Breast-feeding How should I use this medication? This medication is injected into a vein. It is given in a hospital or clinic setting. Talk to your care team about the use of this medication in children. Special care may be needed. Overdosage: If you think you have taken too much of this medicine contact a poison control center or emergency room at once. NOTE: This medicine is only for you. Do not share this medicine with others. What if I miss a dose? This does not apply. This medication is not for regular use. What may interact with this medication? Do not take this medication with any of the following: Certain diuretics, such as spironolactone, triamterene Eplerenone Sodium polystyrene sulfonate This medication may also interact with the following: Certain medications for blood pressure or heart disease, such as lisinopril, losartan, quinapril, valsartan Medications that lower your chance of fighting infection, such as cyclosporine, tacrolimus NSAIDs, medications for pain and inflammation, such as ibuprofen or naproxen Other potassium supplements Salt substitutes This list may not describe all possible interactions. Give your health care provider a list of all the medicines, herbs, non-prescription drugs, or dietary supplements you use. Also tell them if you smoke, drink alcohol, or use illegal drugs. Some items may interact with your medicine. What should I watch for while using this medication? Visit your care team for regular checks on your progress. Tell your care team if your symptoms do not start to get better or if they get worse. You may need blood work while you are taking this medication. Avoid salt substitutes unless you are told otherwise by your care team. What side effects may I  notice from receiving this medication? Side effects that you should report to your care team as soon as possible: Allergic reactions--skin rash, itching, hives, swelling of the face, lips, tongue, or throat High potassium level--muscle weakness, fast or irregular heartbeat Side effects that usually do not require medical attention (report to your care team if they continue or are bothersome): Diarrhea Nausea Stomach pain Vomiting This list may not describe all possible side effects. Call your doctor for medical advice about side effects. You may report side effects to FDA at 1-800-FDA-1088. Where should I keep my medication? This medication is given in a hospital or clinic. It will not be stored at home. NOTE: This sheet is a summary. It may not cover all possible information. If you have questions about this medicine, talk to your doctor, pharmacist, or health care provider.  2023 Elsevier/Gold Standard (2007-11-30 00:00:00)

## 2023-02-08 ENCOUNTER — Encounter: Payer: Self-pay | Admitting: *Deleted

## 2023-02-08 ENCOUNTER — Inpatient Hospital Stay: Payer: No Typology Code available for payment source

## 2023-02-08 ENCOUNTER — Other Ambulatory Visit: Payer: Self-pay | Admitting: Nurse Practitioner

## 2023-02-08 ENCOUNTER — Other Ambulatory Visit: Payer: Self-pay

## 2023-02-08 VITALS — BP 91/64 | HR 98 | Temp 96.7°F | Resp 20

## 2023-02-08 DIAGNOSIS — Z452 Encounter for adjustment and management of vascular access device: Secondary | ICD-10-CM

## 2023-02-08 DIAGNOSIS — R7881 Bacteremia: Secondary | ICD-10-CM | POA: Diagnosis not present

## 2023-02-08 DIAGNOSIS — C3490 Malignant neoplasm of unspecified part of unspecified bronchus or lung: Secondary | ICD-10-CM

## 2023-02-08 DIAGNOSIS — Z5112 Encounter for antineoplastic immunotherapy: Secondary | ICD-10-CM | POA: Diagnosis not present

## 2023-02-08 LAB — TYPE AND SCREEN
ABO/RH(D): A NEG
Unit division: 0

## 2023-02-08 LAB — CBC WITH DIFFERENTIAL (CANCER CENTER ONLY)
Abs Immature Granulocytes: 0 10*3/uL (ref 0.00–0.07)
Band Neutrophils: 6 %
Basophils Absolute: 0 10*3/uL (ref 0.0–0.1)
Basophils Relative: 4 %
Eosinophils Absolute: 0 10*3/uL (ref 0.0–0.5)
Eosinophils Relative: 0 %
HCT: 24.7 % — ABNORMAL LOW (ref 36.0–46.0)
Hemoglobin: 7.7 g/dL — ABNORMAL LOW (ref 12.0–15.0)
Lymphocytes Relative: 44 %
Lymphs Abs: 0.4 10*3/uL — ABNORMAL LOW (ref 0.7–4.0)
MCH: 29.5 pg (ref 26.0–34.0)
MCHC: 31.2 g/dL (ref 30.0–36.0)
MCV: 94.6 fL (ref 80.0–100.0)
Monocytes Absolute: 0.1 10*3/uL (ref 0.1–1.0)
Monocytes Relative: 12 %
Myelocytes: 2 %
Neutro Abs: 0.4 10*3/uL — CL (ref 1.7–7.7)
Neutrophils Relative %: 32 %
Platelet Count: 32 10*3/uL — ABNORMAL LOW (ref 150–400)
RBC: 2.61 MIL/uL — ABNORMAL LOW (ref 3.87–5.11)
RDW: 18.6 % — ABNORMAL HIGH (ref 11.5–15.5)
WBC Count: 1 10*3/uL — ABNORMAL LOW (ref 4.0–10.5)
nRBC: 0 % (ref 0.0–0.2)

## 2023-02-08 LAB — SAMPLE TO BLOOD BANK

## 2023-02-08 LAB — BPAM RBC: Unit Type and Rh: 600

## 2023-02-08 LAB — PREPARE RBC (CROSSMATCH)

## 2023-02-08 MED ORDER — HEPARIN SOD (PORK) LOCK FLUSH 100 UNIT/ML IV SOLN
250.0000 [IU] | Freq: Once | INTRAVENOUS | Status: AC
  Administered 2023-02-08: 250 [IU] via INTRAVENOUS

## 2023-02-08 MED ORDER — SODIUM CHLORIDE 0.9% FLUSH
10.0000 mL | Freq: Once | INTRAVENOUS | Status: AC
  Administered 2023-02-08: 10 mL via INTRAVENOUS

## 2023-02-08 NOTE — Patient Instructions (Signed)
PICC Home Care Guide A peripherally inserted central catheter (PICC) is a form of IV access that allows medicines and IV fluids to be quickly put into the blood and spread throughout the body. The PICC is a long, thin, flexible tube (catheter) that is put into a vein in a person's arm or leg. The catheter ends in a large vein just outside the heart called the superior vena cava (SVC). After the PICC is put in, a chest X-ray may be done to make sure that it is in the right place. A PICC may be placed for different reasons, such as: To give medicines and liquid nutrition. To give IV fluids and blood products. To take blood samples often. If there is trouble placing a peripheral intravenous (PIV) catheter. If cared for properly, a PICC can remain in place for many months. Having a PICC can allow you to go home from the hospital sooner and continue treatment at home. Medicines and PICC care can be managed at home by a family member, caregiver, or home health care team. What are the risks? Generally, having a PICC is safe. However, problems may occur, including: A blood clot (thrombus) forming in or at the end of the PICC. A blood clot forming in a vein (deep vein thrombosis) or traveling to the lung (pulmonary embolism). Inflammation of the vein (phlebitis) in which the PICC is placed. Infection at the insertion site or in the blood. Blood infections from central lines, like PICCs, can be serious and often require a hospital stay. PICC malposition, or PICC movement or poor placement. A break or cut in the PICC. Do not use scissors near the PICC. Nerve or tendon irritation or injury during PICC insertion. How to care for your PICC Please follow the specific guidelines provided by your health care provider. Preventing infection You and any caregivers should wash your hands often with soap and water for at least 20 seconds. Wash hands: Before touching the PICC or the infusion device. Before changing a  bandage (dressing). Do not change the dressing unless you have been taught to do so and have shown you are able to change it safely. Flush the PICC as told. Tell your health care provider right away if the PICC is hard to flush or does not flush. Do not use force to flush the PICC. Use clean and germ-free (sterile) supplies only. Keep the supplies in a dry place. Do not reuse needles, syringes, or any other supplies. Reusing supplies can lead to infection. Keep the PICC dressing dry and secure it with tape if the edges stop sticking to your skin. Check your PICC insertion site every day for signs of infection. Check for: Redness, swelling, or pain. Fluid or blood. Warmth. Pus or a bad smell. Preventing other problems Do not use a syringe that is less than 10 mL to flush the PICC. Do not have your blood pressure checked on the arm in which the PICC is placed. Do not ever pull or tug on the PICC. Keep it secured to your arm with tape or a stretch wrap when not in use. Do not take the PICC out yourself. Only a trained health care provider should remove the PICC. Keep pets and children away from your PICC. How to care for your PICC dressing Keep your PICC dressing clean and dry to prevent infection. Do not take baths, swim, or use a hot tub until your health care provider approves. Ask your health care provider if you can take   showers. You may only be allowed to take sponge baths. When you are allowed to shower: Ask your health care provider to teach you how to wrap the PICC. Cover the PICC with clear plastic wrap and tape to keep it dry while showering. Follow instructions from your health care provider about how to take care of your insertion site and dressing. Make sure you: Wash your hands with soap and water for at least 20 seconds before and after you change your dressing. If soap and water are not available, use hand sanitizer. Change your dressing only if taught to do so by your health care  provider. Your PICC dressing needs to be changed if it becomes loose or wet. Leave stitches (sutures), skin glue, or adhesive strips in place. These skin closures may need to stay in place for 2 weeks or longer. If adhesive strip edges start to loosen and curl up, you may trim the loose edges. Do not remove adhesive strips completely unless your health care provider tells you to do that. Follow these instructions at home: Disposal of supplies Throw away any syringes in a disposal container that is meant for sharp items (sharps container). You can buy a sharps container from a pharmacy, or you can make one by using an empty, hard plastic bottle with a lid. Place any used dressings or infusion bags into a plastic bag. Throw that bag in the trash. General instructions  Always carry your PICC identification card or wear a medical alert bracelet. Keep the tube clamped at all times, unless it is being used. Always carry a smooth-edge clamp with you to clamp the PICC if it breaks. Do not use scissors or sharp objects near the tube. You may bend your arm and move it freely. If your PICC is near or at the bend of your elbow, avoid activity with repeated motion at the elbow. Avoid lifting heavy objects as told by your health care provider. Keep all follow-up visits. This is important. You will need to have your PICC dressing changed at least once a week. Contact a health care provider if: You have pain in your arm, ear, face, or teeth. You have a fever or chills. You have redness, swelling, or pain around the insertion site. You have fluid or blood coming from the insertion site. Your insertion site feels warm to the touch. You have pus or a bad smell coming from the insertion site. Your skin feels hard and raised around the insertion site. Your PICC dressing has gotten wet or is coming off and you have not been taught how to change it. Get help right away if: You have problems with your PICC, such as  your PICC: Was tugged or pulled and has partially come out. Do not  push the PICC back in. Cannot be flushed, is hard to flush, or leaks around the insertion site when it is flushed. Makes a flushing sound when it is flushed. Appears to have a hole or tear. Is accidentally pulled all the way out. If this happens, cover the insertion site with a gauze dressing. Do not throw the PICC away. Your health care provider will need to check it to be sure the entire catheter came out. You feel your heart racing or skipping beats, or you have chest pain. You have shortness of breath or trouble breathing. You have swelling, redness, warmth, or pain in the arm in which the PICC is placed. You have a red streak going up your arm that   starts under the PICC dressing. These symptoms may be an emergency. Get help right away. Call 911. Do not wait to see if the symptoms will go away. Do not drive yourself to the hospital. Summary A peripherally inserted central catheter (PICC) is a long, thin, flexible tube (catheter) that is put into a vein in the arm or leg. If cared for properly, a PICC can remain in place for many months. Having a PICC can allow you to go home from the hospital sooner and continue treatment at home. The PICC is inserted using a germ-free (sterile) technique by a specially trained health care provider. Only a trained health care provider should remove it. Do not have your blood pressure checked on the arm in which your PICC is placed. Always keep your PICC identification card with you. This information is not intended to replace advice given to you by your health care provider. Make sure you discuss any questions you have with your health care provider. Document Revised: 04/27/2021 Document Reviewed: 04/27/2021 Elsevier Patient Education  2023 Elsevier Inc.  

## 2023-02-08 NOTE — Progress Notes (Signed)
Patient had hemoglobin checked this AM and it is 7.7. Patient stated she feels SOB upon exertion, but no different than what it has been the past few days. Patient reports minor bleeding where she has skin lesions and has noticed some blood on the tissue after blowing her nose. Denies any major bleeding (nose bleeds, blood in stool, etc). Patient stated she feels about the same as she did before her previous blood transfusion.  Message sent to collaborative nurses Kem Boroughs, RN and Stephanie Coup, LPN for physician notification.  Patient notified of lab results and informed she will receive a phone call later today with further instructions. Patient verbalized understanding.

## 2023-02-08 NOTE — Progress Notes (Signed)
CRITICAL VALUE STICKER  CRITICAL VALUE: ANC 0.4  RECEIVER (on-site recipient of call):Jamol Ginyard,RN  DATE & TIME NOTIFIED: 02/08/23 @ 0943  MESSENGER (representative from lab):Maurine Minister  MD NOTIFIED: Dr. Truett Perna  TIME OF NOTIFICATION:0947  RESPONSE: Will re-check CBC/diff on 4/19

## 2023-02-09 ENCOUNTER — Other Ambulatory Visit: Payer: Self-pay | Admitting: Nurse Practitioner

## 2023-02-09 ENCOUNTER — Inpatient Hospital Stay: Payer: No Typology Code available for payment source

## 2023-02-09 DIAGNOSIS — C342 Malignant neoplasm of middle lobe, bronchus or lung: Secondary | ICD-10-CM

## 2023-02-09 DIAGNOSIS — C3490 Malignant neoplasm of unspecified part of unspecified bronchus or lung: Secondary | ICD-10-CM

## 2023-02-09 DIAGNOSIS — Z5112 Encounter for antineoplastic immunotherapy: Secondary | ICD-10-CM | POA: Diagnosis not present

## 2023-02-09 LAB — CBC WITH DIFFERENTIAL (CANCER CENTER ONLY)
Abs Immature Granulocytes: 0 10*3/uL (ref 0.00–0.07)
Band Neutrophils: 1 %
Basophils Absolute: 0 10*3/uL (ref 0.0–0.1)
Basophils Relative: 1 %
Eosinophils Absolute: 0.1 10*3/uL (ref 0.0–0.5)
Eosinophils Relative: 2 %
HCT: 25.3 % — ABNORMAL LOW (ref 36.0–46.0)
Hemoglobin: 7.9 g/dL — ABNORMAL LOW (ref 12.0–15.0)
Lymphocytes Relative: 23 %
Lymphs Abs: 0.7 10*3/uL (ref 0.7–4.0)
MCH: 29.3 pg (ref 26.0–34.0)
MCHC: 31.2 g/dL (ref 30.0–36.0)
MCV: 93.7 fL (ref 80.0–100.0)
Metamyelocytes Relative: 1 %
Monocytes Absolute: 0.3 10*3/uL (ref 0.1–1.0)
Monocytes Relative: 12 %
Neutro Abs: 1.8 10*3/uL (ref 1.7–7.7)
Neutrophils Relative %: 60 %
Platelet Count: 34 10*3/uL — ABNORMAL LOW (ref 150–400)
RBC: 2.7 MIL/uL — ABNORMAL LOW (ref 3.87–5.11)
RDW: 18.5 % — ABNORMAL HIGH (ref 11.5–15.5)
WBC Count: 2.9 10*3/uL — ABNORMAL LOW (ref 4.0–10.5)
nRBC: 0.7 % — ABNORMAL HIGH (ref 0.0–0.2)

## 2023-02-09 LAB — TYPE AND SCREEN

## 2023-02-09 LAB — BPAM RBC
Blood Product Expiration Date: 202405102359
Unit Type and Rh: 600

## 2023-02-09 LAB — MAGNESIUM: Magnesium: 1.2 mg/dL — ABNORMAL LOW (ref 1.7–2.4)

## 2023-02-09 MED ORDER — SODIUM CHLORIDE 0.9 % IV SOLN: INTRAVENOUS | Status: DC

## 2023-02-09 MED ORDER — SODIUM CHLORIDE 0.9% IV SOLUTION
250.0000 mL | Freq: Once | INTRAVENOUS | Status: AC
  Administered 2023-02-09: 250 mL via INTRAVENOUS

## 2023-02-09 MED ORDER — HEPARIN SOD (PORK) LOCK FLUSH 100 UNIT/ML IV SOLN
500.0000 [IU] | Freq: Every day | INTRAVENOUS | Status: AC | PRN
  Administered 2023-02-09: 500 [IU]

## 2023-02-09 MED ORDER — MAGNESIUM SULFATE 4 GM/100ML IV SOLN
4.0000 g | Freq: Once | INTRAVENOUS | Status: AC
  Administered 2023-02-09: 4 g via INTRAVENOUS
  Filled 2023-02-09: qty 100

## 2023-02-09 MED ORDER — SODIUM CHLORIDE 0.9% FLUSH
10.0000 mL | INTRAVENOUS | Status: AC | PRN
  Administered 2023-02-09: 10 mL

## 2023-02-09 NOTE — Patient Instructions (Addendum)
Blood Transfusion, Adult, Care After After a blood transfusion, it is common to have: Bruising and soreness at the IV site. A headache. Follow these instructions at home: Your doctor may give you more instructions. If you have problems, contact your doctor. Insertion site care     Follow instructions from your doctor about how to take care of your insertion site. This is where an IV tube was put into your vein. Make sure you: Wash your hands with soap and water for at least 20 seconds before and after you change your bandage. If you cannot use soap and water, use hand sanitizer. Change your bandage as told by your doctor. Check your insertion site every day for signs of infection. Check for: Redness, swelling, or pain. Bleeding from the site. Warmth. Pus or a bad smell. General instructions Take over-the-counter and prescription medicines only as told by your doctor. Rest as told by your doctor. Go back to your normal activities as told by your doctor. Keep all follow-up visits. You may need to have tests at certain times to check your blood. Contact a doctor if: You have itching or red, swollen areas of skin (hives). You have a fever or chills. You have pain in the head, back, or chest. You feel worried or nervous (anxious). You feel weak after doing your normal activities. You have any of these problems at the insertion site: Redness, swelling, warmth, or pain. Bleeding that does not stop with pressure. Pus or a bad smell. If you received your blood transfusion in an outpatient setting, you will be told whom to contact to report any reactions. Get help right away if: You have signs of a serious reaction. This may be coming from an allergy or the body's defense system (immune system). Signs include: Trouble breathing or shortness of breath. Swelling of the face or feeling warm (flushed). A widespread rash. Dark pee (urine) or blood in the pee. Fast heartbeat. These symptoms  may be an emergency. Get help right away. Call 911. Do not wait to see if the symptoms will go away. Do not drive yourself to the hospital. Summary Bruising and soreness at the IV site are common. Check your insertion site every day for signs of infection. Rest as told by your doctor. Go back to your normal activities as told by your doctor. Get help right away if you have signs of a serious reaction. This information is not intended to replace advice given to you by your health care provider. Make sure you discuss any questions you have with your health care provider. Document Revised: 01/06/2022 Document Reviewed: 01/06/2022 Elsevier Patient Education  2023 Elsevier Inc.  Magnesium Sulfate Injection What is this medication? MAGNESIUM SULFATE (mag NEE zee um SUL fate) prevents and treats low levels of magnesium in your body. It may also be used to prevent and treat seizures during pregnancy in people with high blood pressure disorders, such as preeclampsia or eclampsia. Magnesium plays an important role in maintaining the health of your muscles and nervous system. This medicine may be used for other purposes; ask your health care provider or pharmacist if you have questions. What should I tell my care team before I take this medication? They need to know if you have any of these conditions: Heart disease History of irregular heart beat Kidney disease An unusual or allergic reaction to magnesium sulfate, medications, foods, dyes, or preservatives Pregnant or trying to get pregnant Breast-feeding How should I use this medication? This medication is  for infusion into a vein. It is given in a hospital or clinic setting. Talk to your care team about the use of this medication in children. While this medication may be prescribed for selected conditions, precautions do apply. Overdosage: If you think you have taken too much of this medicine contact a poison control center or emergency room at  once. NOTE: This medicine is only for you. Do not share this medicine with others. What if I miss a dose? This does not apply. What may interact with this medication? Certain medications for anxiety or sleep Certain medications for seizures, such phenobarbital Digoxin Medications that relax muscles for surgery Narcotic medications for pain This list may not describe all possible interactions. Give your health care provider a list of all the medicines, herbs, non-prescription drugs, or dietary supplements you use. Also tell them if you smoke, drink alcohol, or use illegal drugs. Some items may interact with your medicine. What should I watch for while using this medication? Your condition will be monitored carefully while you are receiving this medication. You may need blood work done while you are receiving this medication. What side effects may I notice from receiving this medication? Side effects that you should report to your care team as soon as possible: Allergic reactions--skin rash, itching, hives, swelling of the face, lips, tongue, or throat High magnesium level--confusion, drowsiness, facial flushing, redness, sweating, muscle weakness, fast or irregular heartbeat, trouble breathing Low blood pressure--dizziness, feeling faint or lightheaded, blurry vision Side effects that usually do not require medical attention (report to your care team if they continue or are bothersome): Headache Nausea This list may not describe all possible side effects. Call your doctor for medical advice about side effects. You may report side effects to FDA at 1-800-FDA-1088. Where should I keep my medication? This medication is given in a hospital or clinic and will not be stored at home. NOTE: This sheet is a summary. It may not cover all possible information. If you have questions about this medicine, talk to your doctor, pharmacist, or health care provider.  2023 Elsevier/Gold Standard (2013-02-14  00:00:00)

## 2023-02-10 LAB — TYPE AND SCREEN
Antibody Screen: NEGATIVE
Unit division: 0

## 2023-02-10 LAB — BPAM RBC
Blood Product Expiration Date: 202405082359
ISSUE DATE / TIME: 202404190618
Unit Type and Rh: 600

## 2023-02-14 ENCOUNTER — Inpatient Hospital Stay (HOSPITAL_BASED_OUTPATIENT_CLINIC_OR_DEPARTMENT_OTHER): Payer: No Typology Code available for payment source | Admitting: Nurse Practitioner

## 2023-02-14 ENCOUNTER — Other Ambulatory Visit: Payer: Self-pay

## 2023-02-14 ENCOUNTER — Encounter: Payer: Self-pay | Admitting: Nurse Practitioner

## 2023-02-14 ENCOUNTER — Inpatient Hospital Stay: Payer: No Typology Code available for payment source

## 2023-02-14 VITALS — BP 133/85 | HR 89 | Temp 98.1°F | Resp 18 | Ht 65.0 in | Wt 208.8 lb

## 2023-02-14 VITALS — BP 120/78 | HR 82

## 2023-02-14 DIAGNOSIS — Z452 Encounter for adjustment and management of vascular access device: Secondary | ICD-10-CM

## 2023-02-14 DIAGNOSIS — C342 Malignant neoplasm of middle lobe, bronchus or lung: Secondary | ICD-10-CM

## 2023-02-14 DIAGNOSIS — C8599 Non-Hodgkin lymphoma, unspecified, extranodal and solid organ sites: Secondary | ICD-10-CM

## 2023-02-14 DIAGNOSIS — C3490 Malignant neoplasm of unspecified part of unspecified bronchus or lung: Secondary | ICD-10-CM

## 2023-02-14 DIAGNOSIS — Z5112 Encounter for antineoplastic immunotherapy: Secondary | ICD-10-CM | POA: Diagnosis not present

## 2023-02-14 DIAGNOSIS — Z95828 Presence of other vascular implants and grafts: Secondary | ICD-10-CM

## 2023-02-14 LAB — CBC WITH DIFFERENTIAL (CANCER CENTER ONLY)
Abs Immature Granulocytes: 0.18 10*3/uL — ABNORMAL HIGH (ref 0.00–0.07)
Basophils Absolute: 0.1 10*3/uL (ref 0.0–0.1)
Basophils Relative: 1 %
Eosinophils Absolute: 0 10*3/uL (ref 0.0–0.5)
Eosinophils Relative: 0 %
HCT: 33 % — ABNORMAL LOW (ref 36.0–46.0)
Hemoglobin: 10.6 g/dL — ABNORMAL LOW (ref 12.0–15.0)
Immature Granulocytes: 2 %
Lymphocytes Relative: 14 %
Lymphs Abs: 1.5 10*3/uL (ref 0.7–4.0)
MCH: 30.5 pg (ref 26.0–34.0)
MCHC: 32.1 g/dL (ref 30.0–36.0)
MCV: 94.8 fL (ref 80.0–100.0)
Monocytes Absolute: 0.8 10*3/uL (ref 0.1–1.0)
Monocytes Relative: 7 %
Neutro Abs: 8.2 10*3/uL — ABNORMAL HIGH (ref 1.7–7.7)
Neutrophils Relative %: 76 %
Platelet Count: 65 10*3/uL — ABNORMAL LOW (ref 150–400)
RBC: 3.48 MIL/uL — ABNORMAL LOW (ref 3.87–5.11)
RDW: 18.6 % — ABNORMAL HIGH (ref 11.5–15.5)
WBC Count: 10.7 10*3/uL — ABNORMAL HIGH (ref 4.0–10.5)
nRBC: 0 % (ref 0.0–0.2)

## 2023-02-14 LAB — BASIC METABOLIC PANEL - CANCER CENTER ONLY
Anion gap: 9 (ref 5–15)
BUN: 11 mg/dL (ref 8–23)
CO2: 31 mmol/L (ref 22–32)
Calcium: 7.3 mg/dL — ABNORMAL LOW (ref 8.9–10.3)
Chloride: 102 mmol/L (ref 98–111)
Creatinine: 1.08 mg/dL — ABNORMAL HIGH (ref 0.44–1.00)
GFR, Estimated: 53 mL/min — ABNORMAL LOW (ref >60)
Glucose, Bld: 108 mg/dL — ABNORMAL HIGH (ref 70–99)
Potassium: 2.9 mmol/L — ABNORMAL LOW (ref 3.5–5.1)
Sodium: 142 mmol/L (ref 135–145)

## 2023-02-14 LAB — MAGNESIUM: Magnesium: 1 mg/dL — ABNORMAL LOW (ref 1.7–2.4)

## 2023-02-14 MED ORDER — POTASSIUM CHLORIDE 10 MEQ/100ML IV SOLN
10.0000 meq | INTRAVENOUS | Status: AC
  Administered 2023-02-14 (×2): 10 meq via INTRAVENOUS
  Filled 2023-02-14: qty 100

## 2023-02-14 MED ORDER — HEPARIN SOD (PORK) LOCK FLUSH 100 UNIT/ML IV SOLN: 500.0000 [IU] | Freq: Once | INTRAVENOUS | Status: DC

## 2023-02-14 MED ORDER — OXYCODONE HCL 5 MG PO TABS
5.0000 mg | ORAL_TABLET | Freq: Four times a day (QID) | ORAL | 0 refills | Status: DC | PRN
Start: 2023-02-14 — End: 2023-03-14

## 2023-02-14 MED ORDER — HEPARIN SOD (PORK) LOCK FLUSH 100 UNIT/ML IV SOLN
250.0000 [IU] | Freq: Once | INTRAVENOUS | Status: AC
  Administered 2023-02-14: 250 [IU] via INTRAVENOUS

## 2023-02-14 MED ORDER — SODIUM CHLORIDE 0.9 % IV SOLN: Freq: Once | INTRAVENOUS | Status: AC

## 2023-02-14 MED ORDER — SODIUM CHLORIDE 0.9% FLUSH
10.0000 mL | Freq: Once | INTRAVENOUS | Status: AC
  Administered 2023-02-14: 10 mL via INTRAVENOUS

## 2023-02-14 MED ORDER — MAGNESIUM SULFATE 4 GM/100ML IV SOLN
4.0000 g | Freq: Once | INTRAVENOUS | Status: AC
  Administered 2023-02-14: 4 g via INTRAVENOUS
  Filled 2023-02-14: qty 100

## 2023-02-14 MED ORDER — LORAZEPAM 0.5 MG PO TABS
0.5000 mg | ORAL_TABLET | Freq: Two times a day (BID) | ORAL | 0 refills | Status: DC | PRN
Start: 2023-02-14 — End: 2023-03-14
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
## 2023-02-14 NOTE — Patient Instructions (Signed)
Hypomagnesemia Hypomagnesemia is a condition in which the level of magnesium in the blood is too low. Magnesium is a mineral that is found in many foods. It is used in many different processes in the body. Hypomagnesemia can affect every organ in the body. In severe cases, it can cause life-threatening problems. What are the causes? This condition may be caused by: Not getting enough magnesium in your diet or not having enough healthy foods to eat (malnutrition). Problems with magnesium absorption in the intestines. Dehydration. Excessive use of alcohol. Vomiting. Severe or long-term (chronic) diarrhea. Some medicines, including medicines that make you urinate more often (diuretics). Certain diseases, such as kidney disease, diabetes, celiac disease, and overactive thyroid. What are the signs or symptoms? Symptoms of this condition include: Loss of appetite, nausea, and vomiting. Involuntary shaking or trembling of a body part (tremor). Muscle weakness or tingling in the arms and legs. Sudden tightening of muscles (muscle spasms). Confusion. Psychiatric issues, such as: Depression and irritability. Psychosis. A feeling of fluttering of the heart (palpitations). Seizures. These symptoms are more severe if magnesium levels drop suddenly. How is this diagnosed? This condition may be diagnosed based on: Your symptoms and medical history. A physical exam. Blood and urine tests. How is this treated? Treatment depends on the cause and the severity of the condition. It may be treated by: Taking a magnesium supplement. This can be taken in pill form. If the condition is severe, magnesium is usually given through an IV. Making changes to your diet. You may be directed to eat foods that have a lot of magnesium, such as green leafy vegetables, peas, beans, and nuts. Not drinking alcohol. If you are struggling not to drink, ask your health care provider for help. Follow these instructions at  home: Eating and drinking     Make sure that your diet includes foods with magnesium. Foods that have a lot of magnesium in them include: Green leafy vegetables, such as spinach and broccoli. Beans and peas. Nuts and seeds, such as almonds and sunflower seeds. Whole grains, such as whole grain bread and fortified cereals. Drink fluids that contain salts and minerals (electrolytes), such as sports drinks, when you are active. Do not drink alcohol. General instructions Take over-the-counter and prescription medicines only as told by your health care provider. Take magnesium supplements as directed if your health care provider tells you to take them. Have your magnesium levels monitored as told by your health care provider. Keep all follow-up visits. This is important. Contact a health care provider if: You get worse instead of better. Your symptoms return. Get help right away if: You develop severe muscle weakness. You have trouble breathing. You feel that your heart is racing. These symptoms may represent a serious problem that is an emergency. Do not wait to see if the symptoms will go away. Get medical help right away. Call your local emergency services (911 in the [place] Do not drive yourself to the hospital. Summary Hypomagnesemia is a condition in which the level of magnesium in the blood is too low. Hypomagnesemia can affect every organ in the body. Treatment may include eating more foods that contain magnesium, taking magnesium supplements, and not drinking alcohol. Have your magnesium levels monitored as told by your health care provider. This information is not intended to replace advice given to you by your health care provider. Make sure you discuss any questions you have with your health care provider. Document Revised: 03/08/2021 Document Reviewed: 03/08/2021 Elsevier Patient Education    2023 [employer] Inc[employer]  

## 2023-02-15 ENCOUNTER — Inpatient Hospital Stay: Payer: No Typology Code available for payment source

## 2023-02-18 ENCOUNTER — Other Ambulatory Visit: Payer: Self-pay | Admitting: Oncology

## 2023-02-19 DIAGNOSIS — J9601 Acute respiratory failure with hypoxia: Secondary | ICD-10-CM | POA: Diagnosis not present

## 2023-02-19 DIAGNOSIS — J910 Malignant pleural effusion: Secondary | ICD-10-CM | POA: Diagnosis not present

## 2023-02-21 ENCOUNTER — Inpatient Hospital Stay: Payer: No Typology Code available for payment source | Attending: Oncology | Admitting: Nurse Practitioner

## 2023-02-21 ENCOUNTER — Inpatient Hospital Stay: Payer: No Typology Code available for payment source

## 2023-02-21 ENCOUNTER — Inpatient Hospital Stay: Payer: No Typology Code available for payment source | Attending: Oncology

## 2023-02-21 ENCOUNTER — Encounter: Payer: Self-pay | Admitting: Nurse Practitioner

## 2023-02-21 VITALS — BP 98/70 | HR 100 | Temp 97.9°F | Resp 18 | Ht 65.0 in | Wt 201.0 lb

## 2023-02-21 DIAGNOSIS — C3490 Malignant neoplasm of unspecified part of unspecified bronchus or lung: Secondary | ICD-10-CM | POA: Diagnosis not present

## 2023-02-21 DIAGNOSIS — Z5112 Encounter for antineoplastic immunotherapy: Secondary | ICD-10-CM | POA: Insufficient documentation

## 2023-02-21 DIAGNOSIS — E876 Hypokalemia: Secondary | ICD-10-CM | POA: Insufficient documentation

## 2023-02-21 DIAGNOSIS — C342 Malignant neoplasm of middle lobe, bronchus or lung: Secondary | ICD-10-CM | POA: Insufficient documentation

## 2023-02-21 DIAGNOSIS — Z5111 Encounter for antineoplastic chemotherapy: Secondary | ICD-10-CM | POA: Diagnosis not present

## 2023-02-21 DIAGNOSIS — Z79620 Long term (current) use of immunosuppressive biologic: Secondary | ICD-10-CM | POA: Insufficient documentation

## 2023-02-21 LAB — CBC WITH DIFFERENTIAL (CANCER CENTER ONLY)
Abs Immature Granulocytes: 0.08 10*3/uL — ABNORMAL HIGH (ref 0.00–0.07)
Basophils Absolute: 0 10*3/uL (ref 0.0–0.1)
Basophils Relative: 1 %
Eosinophils Absolute: 0 10*3/uL (ref 0.0–0.5)
Eosinophils Relative: 0 %
HCT: 33.3 % — ABNORMAL LOW (ref 36.0–46.0)
Hemoglobin: 10.3 g/dL — ABNORMAL LOW (ref 12.0–15.0)
Immature Granulocytes: 1 %
Lymphocytes Relative: 14 %
Lymphs Abs: 0.9 10*3/uL (ref 0.7–4.0)
MCH: 30 pg (ref 26.0–34.0)
MCHC: 30.9 g/dL (ref 30.0–36.0)
MCV: 97.1 fL (ref 80.0–100.0)
Monocytes Absolute: 0.6 10*3/uL (ref 0.1–1.0)
Monocytes Relative: 10 %
Neutro Abs: 4.6 10*3/uL (ref 1.7–7.7)
Neutrophils Relative %: 74 %
Platelet Count: 140 10*3/uL — ABNORMAL LOW (ref 150–400)
RBC: 3.43 MIL/uL — ABNORMAL LOW (ref 3.87–5.11)
RDW: 19 % — ABNORMAL HIGH (ref 11.5–15.5)
WBC Count: 6.2 10*3/uL (ref 4.0–10.5)
nRBC: 0 % (ref 0.0–0.2)

## 2023-02-21 LAB — CMP (CANCER CENTER ONLY)
ALT: <5 U/L (ref 0–44)
AST: 15 U/L (ref 15–41)
Albumin: 2.7 g/dL — ABNORMAL LOW (ref 3.5–5.0)
Alkaline Phosphatase: 99 U/L (ref 38–126)
Anion gap: 7 (ref 5–15)
BUN: 9 mg/dL (ref 8–23)
CO2: 31 mmol/L (ref 22–32)
Calcium: 7.7 mg/dL — ABNORMAL LOW (ref 8.9–10.3)
Chloride: 103 mmol/L (ref 98–111)
Creatinine: 0.92 mg/dL (ref 0.44–1.00)
GFR, Estimated: >60 mL/min (ref >60)
Glucose, Bld: 148 mg/dL — ABNORMAL HIGH (ref 70–99)
Potassium: 3.6 mmol/L (ref 3.5–5.1)
Sodium: 141 mmol/L (ref 135–145)
Total Bilirubin: 0.3 mg/dL (ref 0.3–1.2)
Total Protein: 6.6 g/dL (ref 6.5–8.1)

## 2023-02-21 LAB — MAGNESIUM: Magnesium: 1 mg/dL — ABNORMAL LOW (ref 1.7–2.4)

## 2023-02-21 LAB — TSH: TSH: 3.281 u[IU]/mL (ref 0.350–4.500)

## 2023-02-21 MED ORDER — SODIUM CHLORIDE 0.9% FLUSH
10.0000 mL | INTRAVENOUS | Status: AC | PRN
  Administered 2023-02-21: 10 mL

## 2023-02-21 MED ORDER — HEPARIN SOD (PORK) LOCK FLUSH 100 UNIT/ML IV SOLN
250.0000 [IU] | INTRAVENOUS | Status: AC | PRN
  Administered 2023-02-21: 250 [IU]

## 2023-02-21 MED ORDER — MAGNESIUM SULFATE 4 GM/100ML IV SOLN
4.0000 g | Freq: Once | INTRAVENOUS | Status: AC
  Administered 2023-02-21: 4 g via INTRAVENOUS
  Filled 2023-02-21: qty 100

## 2023-02-21 MED ORDER — SODIUM CHLORIDE 0.9 % IV SOLN: INTRAVENOUS | Status: DC
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
## 2023-02-21 NOTE — Patient Instructions (Signed)
Hypomagnesemia Hypomagnesemia is a condition in which the level of magnesium in the blood is too low. Magnesium is a mineral that is found in many foods. It is used in many different processes in the body. Hypomagnesemia can affect every organ in the body. In severe cases, it can cause life-threatening problems. What are the causes? This condition may be caused by: Not getting enough magnesium in your diet or not having enough healthy foods to eat (malnutrition). Problems with magnesium absorption in the intestines. Dehydration. Excessive use of alcohol. Vomiting. Severe or long-term (chronic) diarrhea. Some medicines, including medicines that make you urinate more often (diuretics). Certain diseases, such as kidney disease, diabetes, celiac disease, and overactive thyroid. What are the signs or symptoms? Symptoms of this condition include: Loss of appetite, nausea, and vomiting. Involuntary shaking or trembling of a body part (tremor). Muscle weakness or tingling in the arms and legs. Sudden tightening of muscles (muscle spasms). Confusion. Psychiatric issues, such as: Depression and irritability. Psychosis. A feeling of fluttering of the heart (palpitations). Seizures. These symptoms are more severe if magnesium levels drop suddenly. How is this diagnosed? This condition may be diagnosed based on: Your symptoms and medical history. A physical exam. Blood and urine tests. How is this treated? Treatment depends on the cause and the severity of the condition. It may be treated by: Taking a magnesium supplement. This can be taken in pill form. If the condition is severe, magnesium is usually given through an IV. Making changes to your diet. You may be directed to eat foods that have a lot of magnesium, such as green leafy vegetables, peas, beans, and nuts. Not drinking alcohol. If you are struggling not to drink, ask your health care provider for help. Follow these instructions at  home: Eating and drinking     Make sure that your diet includes foods with magnesium. Foods that have a lot of magnesium in them include: Green leafy vegetables, such as spinach and broccoli. Beans and peas. Nuts and seeds, such as almonds and sunflower seeds. Whole grains, such as whole grain bread and fortified cereals. Drink fluids that contain salts and minerals (electrolytes), such as sports drinks, when you are active. Do not drink alcohol. General instructions Take over-the-counter and prescription medicines only as told by your health care provider. Take magnesium supplements as directed if your health care provider tells you to take them. Have your magnesium levels monitored as told by your health care provider. Keep all follow-up visits. This is important. Contact a health care provider if: You get worse instead of better. Your symptoms return. Get help right away if: You develop severe muscle weakness. You have trouble breathing. You feel that your heart is racing. These symptoms may represent a serious problem that is an emergency. Do not wait to see if the symptoms will go away. Get medical help right away. Call your local emergency services (911 in the [place] Do not drive yourself to the hospital. Summary Hypomagnesemia is a condition in which the level of magnesium in the blood is too low. Hypomagnesemia can affect every organ in the body. Treatment may include eating more foods that contain magnesium, taking magnesium supplements, and not drinking alcohol. Have your magnesium levels monitored as told by your health care provider. This information is not intended to replace advice given to you by your health care provider. Make sure you discuss any questions you have with your health care provider. Document Revised: 03/08/2021 Document Reviewed: 03/08/2021 Elsevier Patient Education    2023 [employer] Inc[employer]  

## 2023-02-22 ENCOUNTER — Other Ambulatory Visit: Payer: Self-pay

## 2023-02-22 ENCOUNTER — Inpatient Hospital Stay: Payer: No Typology Code available for payment source

## 2023-02-22 VITALS — BP 120/98 | HR 91 | Temp 98.3°F | Resp 18

## 2023-02-22 DIAGNOSIS — C3490 Malignant neoplasm of unspecified part of unspecified bronchus or lung: Secondary | ICD-10-CM

## 2023-02-22 DIAGNOSIS — C342 Malignant neoplasm of middle lobe, bronchus or lung: Secondary | ICD-10-CM

## 2023-02-22 DIAGNOSIS — Z5112 Encounter for antineoplastic immunotherapy: Secondary | ICD-10-CM | POA: Diagnosis not present

## 2023-02-22 MED ORDER — PALONOSETRON HCL INJECTION 0.25 MG/5ML
0.2500 mg | Freq: Once | INTRAVENOUS | Status: AC
  Administered 2023-02-22: 0.25 mg via INTRAVENOUS
  Filled 2023-02-22: qty 5

## 2023-02-22 MED ORDER — SODIUM CHLORIDE 0.9 % IV SOLN
10.0000 mg | Freq: Once | INTRAVENOUS | Status: AC
  Administered 2023-02-22: 10 mg via INTRAVENOUS
  Filled 2023-02-22: qty 10

## 2023-02-22 MED ORDER — DIPHENHYDRAMINE HCL 50 MG/ML IJ SOLN
50.0000 mg | Freq: Once | INTRAMUSCULAR | Status: AC
  Administered 2023-02-22: 50 mg via INTRAVENOUS
  Filled 2023-02-22: qty 1

## 2023-02-22 MED ORDER — SODIUM CHLORIDE 0.9 % IV SOLN: Freq: Once | INTRAVENOUS | Status: AC

## 2023-02-22 MED ORDER — HEPARIN SOD (PORK) LOCK FLUSH 100 UNIT/ML IV SOLN
250.0000 [IU] | Freq: Once | INTRAVENOUS | Status: AC | PRN
  Administered 2023-02-22: 250 [IU]

## 2023-02-22 MED ORDER — SODIUM CHLORIDE 0.9 % IV SOLN
1200.0000 mg | Freq: Once | INTRAVENOUS | Status: AC
  Administered 2023-02-22: 1200 mg via INTRAVENOUS
  Filled 2023-02-22: qty 20

## 2023-02-22 MED ORDER — FAMOTIDINE IN NACL 20-0.9 MG/50ML-% IV SOLN
20.0000 mg | Freq: Once | INTRAVENOUS | Status: AC
  Administered 2023-02-22: 20 mg via INTRAVENOUS
  Filled 2023-02-22: qty 50

## 2023-02-22 MED ORDER — SODIUM CHLORIDE 0.9 % IV SOLN
100.0000 mg/m2 | Freq: Once | INTRAVENOUS | Status: AC
  Administered 2023-02-22: 204 mg via INTRAVENOUS
  Filled 2023-02-22: qty 34

## 2023-02-22 MED ORDER — SODIUM CHLORIDE 0.9 % IV SOLN
139.2000 mg | Freq: Once | INTRAVENOUS | Status: AC
  Administered 2023-02-22: 140 mg via INTRAVENOUS
  Filled 2023-02-22: qty 14

## 2023-02-22 MED ORDER — SODIUM CHLORIDE 0.9% FLUSH
10.0000 mL | INTRAVENOUS | Status: DC | PRN
  Administered 2023-02-22: 10 mL

## 2023-02-22 MED ORDER — SODIUM CHLORIDE 0.9 % IV SOLN
150.0000 mg | Freq: Once | INTRAVENOUS | Status: AC
  Administered 2023-02-22: 150 mg via INTRAVENOUS
  Filled 2023-02-22: qty 150

## 2023-02-22 NOTE — Patient Instructions (Signed)
[place]   Discharge Instructions: Thank you for choosing [facility] to provide your oncology and hematology care.   If you have a lab appointment with the [facility], please go directly to the [facility] and check in at the registration area.   Wear comfortable clothing and clothing appropriate for easy access to any Portacath or PICC line.   We strive to give you quality time with your provider. You may need to reschedule your appointment if you arrive late (15 or more minutes).  Arriving late affects you and other patients whose appointments are after yours.  Also, if you miss three or more appointments without notifying the office, you may be dismissed from the clinic at the provider's discretion.      For prescription refill requests, have your pharmacy contact our office and allow 72 hours for refills to be completed.    Today you received the following chemotherapy and/or immunotherapy agents Atezolizumab (TECENTRIQ), Paclitaxel (TAXOL) & Carboplatin (PARAPLATIN).      To help prevent nausea and vomiting after your treatment, we encourage you to take your nausea medication as directed.  BELOW ARE SYMPTOMS THAT SHOULD BE REPORTED IMMEDIATELY: *FEVER GREATER THAN 100.4 F (38 C) OR HIGHER *CHILLS OR SWEATING *NAUSEA AND VOMITING THAT IS NOT CONTROLLED WITH YOUR NAUSEA MEDICATION *UNUSUAL SHORTNESS OF BREATH *UNUSUAL BRUISING OR BLEEDING *URINARY PROBLEMS (pain or burning when urinating, or frequent urination) *BOWEL PROBLEMS (unusual diarrhea, constipation, pain near the anus) TENDERNESS IN MOUTH AND THROAT WITH OR WITHOUT PRESENCE OF ULCERS (sore throat, sores in mouth, or a toothache) UNUSUAL RASH, SWELLING OR PAIN  UNUSUAL VAGINAL DISCHARGE OR ITCHING   Items with * indicate a potential emergency and should be followed up as soon as possible or go to the Emergency Department if any problems should occur.  Please show  the CHEMOTHERAPY ALERT CARD or IMMUNOTHERAPY ALERT CARD at check-in to the Emergency Department and triage nurse.  Should you have questions after your visit or need to cancel or reschedule your appointment, please contact [facility]  Dept: [phone]  and follow the prompts.  Office hours are 8:00 a.m. to 4:30 p.m. Monday - Friday. Please note that voicemails left after 4:00 p.m. may not be returned until the following business day.  We are closed weekends and major holidays. You have access to a nurse at all times for urgent questions. Please call the main number to the clinic Dept: [phone] and follow the prompts.   For any non-urgent questions, you may also contact your provider using MyChart. We now offer e-Visits for anyone 57 and older to request care online for non-urgent symptoms. For details visit [email or web address].   Also download the MyChart app! Go to the app store, search "MyChart", open the app, select [facility], and log in with your MyChart username and password.  Atezolizumab Injection What is this medication? ATEZOLIZUMAB (a te zoe LIZ ue mab) treats some types of cancer. It works by helping your immune system slow or stop the spread of cancer cells. It is a monoclonal antibody. This medicine may be used for other purposes; ask your health care provider or pharmacist if you have questions. COMMON BRAND NAME(S): Tecentriq What should I tell my care team before I take this medication? They need to know if you have any of these conditions: Allogeneic stem cell transplant (uses someone else's stem cells) Autoimmune diseases, such as Crohn disease, ulcerative colitis,  lupus History of chest radiation Nervous system problems, such as Guillain-Barre syndrome, myasthenia gravis Organ transplant An unusual or allergic reaction to atezolizumab, other medications, foods, dyes, or preservatives Pregnant or trying to get  pregnant Breast-feeding How should I use this medication? This medication is injected into a vein. It is given by your care team in a hospital or clinic setting. A special MedGuide will be given to you before each treatment. Be sure to read this information carefully each time. Talk to your care team about the use of this medication in children. While it may be prescribed for children as young as 2 years for selected conditions, precautions do apply. Overdosage: If you think you have taken too much of this medicine contact a poison control center or emergency room at once. NOTE: This medicine is only for you. Do not share this medicine with others. What if I miss a dose? Keep appointments for follow-up doses. It is important not to miss your dose. Call your care team if you are unable to keep an appointment. What may interact with this medication? Interactions have not been studied. This list may not describe all possible interactions. Give your health care provider a list of all the medicines, herbs, non-prescription drugs, or dietary supplements you use. Also tell them if you smoke, drink alcohol, or use illegal drugs. Some items may interact with your medicine. What should I watch for while using this medication? Your condition will be monitored carefully while you are receiving this medication. You may need blood work while taking this medication. This medication may cause serious skin reactions. They can happen weeks to months after starting the medication. Contact your care team right away if you notice fevers or flu-like symptoms with a rash. The rash may be red or purple and then turn into blisters or peeling of the skin. You may also notice a red rash with swelling of the face, lips, or lymph nodes in your neck or under your arms. Tell your care team right away if you have any change in your eyesight. Talk to your care team if you may be pregnant. Serious birth defects can occur if you take  this medication during pregnancy and for 5 months after the last dose. You will need a negative pregnancy test before starting this medication. Contraception is recommended while taking this medication and for 5 months after the last dose. Your care team can help you find the option that works for you. Do not breastfeed while taking this medication and for at least 5 months after the last dose. What side effects may I notice from receiving this medication? Side effects that you should report to your doctor or health care professional as soon as possible: Allergic reactions--skin rash, itching, hives, swelling of the face, lips, tongue, or throat Dry cough, shortness of breath or trouble breathing Eye pain, redness, irritation, or discharge with blurry or decreased vision Heart muscle inflammation--unusual weakness or fatigue, shortness of breath, chest pain, fast or irregular heartbeat, dizziness, swelling of the ankles, feet, or hands Hormone gland problems--headache, sensitivity to light, unusual weakness or fatigue, dizziness, fast or irregular heartbeat, increased sensitivity to cold or heat, excessive sweating, constipation, hair loss, increased thirst or amount of urine, tremors or shaking, irritability Infusion reactions--chest pain, shortness of breath or trouble breathing, feeling faint or lightheaded Kidney injury (glomerulonephritis)--decrease in the amount of urine, red or dark brown urine, foamy or bubbly urine, swelling of the ankles, hands, or feet Liver injury--right upper  belly pain, loss of appetite, nausea, light-colored stool, dark yellow or brown urine, yellowing skin or eyes, unusual weakness or fatigue Pain, tingling, or numbness in the hands or feet, muscle weakness, change in vision, confusion or trouble speaking, loss of balance or coordination, trouble walking, seizures Rash, fever, and swollen lymph nodes Redness, blistering, peeling, or loosening of the skin, including  inside the mouth Sudden or severe stomach pain, bloody diarrhea, fever, nausea, vomiting Side effects that usually do not require medical attention (report to your doctor or health care professional if they continue or are bothersome): Bone, joint, or muscle pain Diarrhea Fatigue Loss of appetite Nausea Skin rash This list may not describe all possible side effects. Call your doctor for medical advice about side effects. You may report side effects to FDA at 1-800-FDA-1088. Where should I keep my medication? This medication is given in a hospital or clinic. It will not be stored at home. NOTE: This sheet is a summary. It may not cover all possible information. If you have questions about this medicine, talk to your doctor, pharmacist, or health care provider.  2023 Elsevier/Gold Standard (2022-01-30 00:00:00)  Paclitaxel Injection What is this medication? PACLITAXEL (PAK li TAX el) treats some types of cancer. It works by slowing down the growth of cancer cells. This medicine may be used for other purposes; ask your health care provider or pharmacist if you have questions. COMMON BRAND NAME(S): Onxol, Taxol What should I tell my care team before I take this medication? They need to know if you have any of these conditions: Heart disease Liver disease Low white blood cell levels An unusual or allergic reaction to paclitaxel, other medications, foods, dyes, or preservatives If you or your partner are pregnant or trying to get pregnant Breast-feeding How should I use this medication? This medication is injected into a vein. It is given by your care team in a hospital or clinic setting. Talk to your care team about the use of this medication in children. While it may be given to children for selected conditions, precautions do apply. Overdosage: If you think you have taken too much of this medicine contact a poison control center or emergency room at once. NOTE: This medicine is only for  you. Do not share this medicine with others. What if I miss a dose? Keep appointments for follow-up doses. It is important not to miss your dose. Call your care team if you are unable to keep an appointment. What may interact with this medication? Do not take this medication with any of the following: Live virus vaccines Other medications may affect the way this medication works. Talk with your care team about all of the medications you take. They may suggest changes to your treatment plan to lower the risk of side effects and to make sure your medications work as intended. This list may not describe all possible interactions. Give your health care provider a list of all the medicines, herbs, non-prescription drugs, or dietary supplements you use. Also tell them if you smoke, drink alcohol, or use illegal drugs. Some items may interact with your medicine. What should I watch for while using this medication? Your condition will be monitored carefully while you are receiving this medication. You may need blood work while taking this medication. This medication may make you feel generally unwell. This is not uncommon as chemotherapy can affect healthy cells as well as cancer cells. Report any side effects. Continue your course of treatment even though you  feel ill unless your care team tells you to stop. This medication can cause serious allergic reactions. To reduce the risk, your care team may give you other medications to take before receiving this one. Be sure to follow the directions from your care team. This medication may increase your risk of getting an infection. Call your care team for advice if you get a fever, chills, sore throat, or other symptoms of a cold or flu. Do not treat yourself. Try to avoid being around people who are sick. This medication may increase your risk to bruise or bleed. Call your care team if you notice any unusual bleeding. Be careful brushing or flossing your teeth or  using a toothpick because you may get an infection or bleed more easily. If you have any dental work done, tell your dentist you are receiving this medication. Talk to your care team if you may be pregnant. Serious birth defects can occur if you take this medication during pregnancy. Talk to your care team before breastfeeding. Changes to your treatment plan may be needed. What side effects may I notice from receiving this medication? Side effects that you should report to your care team as soon as possible: Allergic reactions--skin rash, itching, hives, swelling of the face, lips, tongue, or throat Heart rhythm changes--fast or irregular heartbeat, dizziness, feeling faint or lightheaded, chest pain, trouble breathing Increase in blood pressure Infection--fever, chills, cough, sore throat, wounds that don't heal, pain or trouble when passing urine, general feeling of discomfort or being unwell Low blood pressure--dizziness, feeling faint or lightheaded, blurry vision Low red blood cell level--unusual weakness or fatigue, dizziness, headache, trouble breathing Painful swelling, warmth, or redness of the skin, blisters or sores at the infusion site Pain, tingling, or numbness in the hands or feet Slow heartbeat--dizziness, feeling faint or lightheaded, confusion, trouble breathing, unusual weakness or fatigue Unusual bruising or bleeding Side effects that usually do not require medical attention (report to your care team if they continue or are bothersome): Diarrhea Hair loss Joint pain Loss of appetite Muscle pain Nausea Vomiting This list may not describe all possible side effects. Call your doctor for medical advice about side effects. You may report side effects to FDA at 1-800-FDA-1088. Where should I keep my medication? This medication is given in a hospital or clinic. It will not be stored at home. NOTE: This sheet is a summary. It may not cover all possible information. If you have  questions about this medicine, talk to your doctor, pharmacist, or health care provider.  2023 Elsevier/Gold Standard (2022-02-23 00:00:00)  Carboplatin Injection What is this medication? CARBOPLATIN (KAR boe pla tin) treats some types of cancer. It works by slowing down the growth of cancer cells. This medicine may be used for other purposes; ask your health care provider or pharmacist if you have questions. COMMON BRAND NAME(S): Paraplatin What should I tell my care team before I take this medication? They need to know if you have any of these conditions: Blood disorders Hearing problems Kidney disease Recent or ongoing radiation therapy An unusual or allergic reaction to carboplatin, cisplatin, other medications, foods, dyes, or preservatives Pregnant or trying to get pregnant Breast-feeding How should I use this medication? This medication is injected into a vein. It is given by your care team in a hospital or clinic setting. Talk to your care team about the use of this medication in children. Special care may be needed. Overdosage: If you think you have taken too much of  this medicine contact a poison control center or emergency room at once. NOTE: This medicine is only for you. Do not share this medicine with others. What if I miss a dose? Keep appointments for follow-up doses. It is important not to miss your dose. Call your care team if you are unable to keep an appointment. What may interact with this medication? Medications for seizures Some antibiotics, such as amikacin, gentamicin, neomycin, streptomycin, tobramycin Vaccines This list may not describe all possible interactions. Give your health care provider a list of all the medicines, herbs, non-prescription drugs, or dietary supplements you use. Also tell them if you smoke, drink alcohol, or use illegal drugs. Some items may interact with your medicine. What should I watch for while using this medication? Your condition  will be monitored carefully while you are receiving this medication. You may need blood work while taking this medication. This medication may make you feel generally unwell. This is not uncommon, as chemotherapy can affect healthy cells as well as cancer cells. Report any side effects. Continue your course of treatment even though you feel ill unless your care team tells you to stop. In some cases, you may be given additional medications to help with side effects. Follow all directions for their use. This medication may increase your risk of getting an infection. Call your care team for advice if you get a fever, chills, sore throat, or other symptoms of a cold or flu. Do not treat yourself. Try to avoid being around people who are sick. Avoid taking medications that contain aspirin, acetaminophen, ibuprofen, naproxen, or ketoprofen unless instructed by your care team. These medications may hide a fever. Be careful brushing or flossing your teeth or using a toothpick because you may get an infection or bleed more easily. If you have any dental work done, tell your dentist you are receiving this medication. Talk to your care team if you wish to become pregnant or think you might be pregnant. This medication can cause serious birth defects. Talk to your care team about effective forms of contraception. Do not breast-feed while taking this medication. What side effects may I notice from receiving this medication? Side effects that you should report to your care team as soon as possible: Allergic reactions--skin rash, itching, hives, swelling of the face, lips, tongue, or throat Infection--fever, chills, cough, sore throat, wounds that don't heal, pain or trouble when passing urine, general feeling of discomfort or being unwell Low red blood cell level--unusual weakness or fatigue, dizziness, headache, trouble breathing Pain, tingling, or numbness in the hands or feet, muscle weakness, change in vision,  confusion or trouble speaking, loss of balance or coordination, trouble walking, seizures Unusual bruising or bleeding Side effects that usually do not require medical attention (report to your care team if they continue or are bothersome): Hair loss Nausea Unusual weakness or fatigue Vomiting This list may not describe all possible side effects. Call your doctor for medical advice about side effects. You may report side effects to FDA at 1-800-FDA-1088. Where should I keep my medication? This medication is given in a hospital or clinic. It will not be stored at home. NOTE: This sheet is a summary. It may not cover all possible information. If you have questions about this medicine, talk to your doctor, pharmacist, or health care provider.  2023 Elsevier/Gold Standard (2007-11-30 00:00:00)

## 2023-02-22 NOTE — Progress Notes (Signed)
Patient had ambulated to bathroom by herself.  Patient yelled out and was found to be sitting on floor next to toilet.  Patient stated she had slide down and missed the commode. Fall was unobserved. Patient was then assisted up by 2 RNs and placed on the commode.  Skin assessment was completed and no new sores/bruises were noted.  Patient denied any new pain after fall. Patient denied hitting her head.  Patient was able to stand up and ambulate with 2 assist back to infusion chair.  Dr. Truett Perna was made aware of fall and instructed nursing staff to contact him if patient had any new complaints of pain or discomfort.   Patient was able to stand up and get into wheelchair with minimal assist after infusion was complete. Patient discharged from infusion room with no additional complaints.

## 2023-02-23 ENCOUNTER — Inpatient Hospital Stay: Payer: No Typology Code available for payment source

## 2023-02-23 VITALS — BP 113/78 | HR 78 | Temp 98.1°F

## 2023-02-23 DIAGNOSIS — Z5112 Encounter for antineoplastic immunotherapy: Secondary | ICD-10-CM | POA: Diagnosis not present

## 2023-02-23 DIAGNOSIS — C342 Malignant neoplasm of middle lobe, bronchus or lung: Secondary | ICD-10-CM

## 2023-02-23 DIAGNOSIS — C3490 Malignant neoplasm of unspecified part of unspecified bronchus or lung: Secondary | ICD-10-CM

## 2023-02-23 LAB — T4: T4, Total: 7 ug/dL (ref 4.5–12.0)

## 2023-02-23 MED ORDER — PEGFILGRASTIM-JMDB 6 MG/0.6ML ~~LOC~~ SOSY
6.0000 mg | PREFILLED_SYRINGE | Freq: Once | SUBCUTANEOUS | Status: AC
  Administered 2023-02-23: 6 mg via SUBCUTANEOUS
  Filled 2023-02-23: qty 0.6

## 2023-02-23 NOTE — Patient Instructions (Signed)

## 2023-02-25 ENCOUNTER — Other Ambulatory Visit: Payer: Self-pay | Admitting: Nurse Practitioner

## 2023-02-26 ENCOUNTER — Encounter: Payer: Self-pay | Admitting: Oncology

## 2023-02-27 ENCOUNTER — Other Ambulatory Visit (HOSPITAL_COMMUNITY): Payer: Self-pay

## 2023-02-28 ENCOUNTER — Other Ambulatory Visit: Payer: Self-pay | Admitting: Oncology

## 2023-02-28 ENCOUNTER — Other Ambulatory Visit: Payer: Self-pay

## 2023-02-28 DIAGNOSIS — C3490 Malignant neoplasm of unspecified part of unspecified bronchus or lung: Secondary | ICD-10-CM

## 2023-02-28 MED ORDER — POTASSIUM CHLORIDE CRYS ER 10 MEQ PO TBCR
30.0000 meq | EXTENDED_RELEASE_TABLET | Freq: Every day | ORAL | 1 refills | Status: DC
Start: 2023-02-28 — End: 2023-02-28

## 2023-02-28 MED ORDER — PANTOPRAZOLE SODIUM 40 MG PO TBEC
40.0000 mg | DELAYED_RELEASE_TABLET | Freq: Two times a day (BID) | ORAL | 1 refills | Status: DC
Start: 2023-02-28 — End: 2023-02-28

## 2023-03-01 ENCOUNTER — Inpatient Hospital Stay: Payer: No Typology Code available for payment source

## 2023-03-01 VITALS — BP 98/74 | HR 94 | Temp 97.3°F

## 2023-03-01 DIAGNOSIS — C3490 Malignant neoplasm of unspecified part of unspecified bronchus or lung: Secondary | ICD-10-CM

## 2023-03-01 DIAGNOSIS — Z5112 Encounter for antineoplastic immunotherapy: Secondary | ICD-10-CM | POA: Diagnosis not present

## 2023-03-01 DIAGNOSIS — C342 Malignant neoplasm of middle lobe, bronchus or lung: Secondary | ICD-10-CM

## 2023-03-01 LAB — CBC WITH DIFFERENTIAL (CANCER CENTER ONLY)
Abs Immature Granulocytes: 0.26 10*3/uL — ABNORMAL HIGH (ref 0.00–0.07)
Basophils Absolute: 0 10*3/uL (ref 0.0–0.1)
Basophils Relative: 1 %
Eosinophils Absolute: 0 10*3/uL (ref 0.0–0.5)
Eosinophils Relative: 0 %
HCT: 29.3 % — ABNORMAL LOW (ref 36.0–46.0)
Hemoglobin: 9.1 g/dL — ABNORMAL LOW (ref 12.0–15.0)
Immature Granulocytes: 10 %
Lymphocytes Relative: 33 %
Lymphs Abs: 0.9 10*3/uL (ref 0.7–4.0)
MCH: 30.3 pg (ref 26.0–34.0)
MCHC: 31.1 g/dL (ref 30.0–36.0)
MCV: 97.7 fL (ref 80.0–100.0)
Monocytes Absolute: 0.5 10*3/uL (ref 0.1–1.0)
Monocytes Relative: 19 %
Neutro Abs: 1 10*3/uL — ABNORMAL LOW (ref 1.7–7.7)
Neutrophils Relative %: 37 %
Platelet Count: 42 10*3/uL — ABNORMAL LOW (ref 150–400)
RBC: 3 MIL/uL — ABNORMAL LOW (ref 3.87–5.11)
RDW: 18 % — ABNORMAL HIGH (ref 11.5–15.5)
WBC Count: 2.7 10*3/uL — ABNORMAL LOW (ref 4.0–10.5)
nRBC: 0 % (ref 0.0–0.2)

## 2023-03-01 LAB — BASIC METABOLIC PANEL - CANCER CENTER ONLY
Anion gap: 7 (ref 5–15)
BUN: 13 mg/dL (ref 8–23)
CO2: 31 mmol/L (ref 22–32)
Calcium: 7.7 mg/dL — ABNORMAL LOW (ref 8.9–10.3)
Chloride: 103 mmol/L (ref 98–111)
Creatinine: 0.93 mg/dL (ref 0.44–1.00)
GFR, Estimated: >60 mL/min (ref >60)
Glucose, Bld: 113 mg/dL — ABNORMAL HIGH (ref 70–99)
Potassium: 3.4 mmol/L — ABNORMAL LOW (ref 3.5–5.1)
Sodium: 141 mmol/L (ref 135–145)

## 2023-03-01 LAB — SAMPLE TO BLOOD BANK

## 2023-03-01 LAB — MAGNESIUM: Magnesium: 1.3 mg/dL — ABNORMAL LOW (ref 1.7–2.4)

## 2023-03-01 MED ORDER — HEPARIN SOD (PORK) LOCK FLUSH 100 UNIT/ML IV SOLN
500.0000 [IU] | Freq: Once | INTRAVENOUS | Status: AC
  Administered 2023-03-01: 500 [IU] via INTRAVENOUS

## 2023-03-01 MED ORDER — SODIUM CHLORIDE 0.9% FLUSH
10.0000 mL | Freq: Once | INTRAVENOUS | Status: AC
  Administered 2023-03-01: 10 mL via INTRAVENOUS

## 2023-03-01 NOTE — Patient Instructions (Signed)
PICC Home Care Guide A peripherally inserted central catheter (PICC) is a form of IV access that allows medicines and IV fluids to be quickly put into the blood and spread throughout the body. The PICC is a long, thin, flexible tube (catheter) that is put into a vein in a person's arm or leg. The catheter ends in a large vein just outside the heart called the superior vena cava (SVC). After the PICC is put in, a chest X-ray may be done to make sure that it is in the right place. A PICC may be placed for different reasons, such as: To give medicines and liquid nutrition. To give IV fluids and blood products. To take blood samples often. If there is trouble placing a peripheral intravenous (PIV) catheter. If cared for properly, a PICC can remain in place for many months. Having a PICC can allow you to go home from the hospital sooner and continue treatment at home. Medicines and PICC care can be managed at home by a family member, caregiver, or home health care team. What are the risks? Generally, having a PICC is safe. However, problems may occur, including: A blood clot (thrombus) forming in or at the end of the PICC. A blood clot forming in a vein (deep vein thrombosis) or traveling to the lung (pulmonary embolism). Inflammation of the vein (phlebitis) in which the PICC is placed. Infection at the insertion site or in the blood. Blood infections from central lines, like PICCs, can be serious and often require a hospital stay. PICC malposition, or PICC movement or poor placement. A break or cut in the PICC. Do not use scissors near the PICC. Nerve or tendon irritation or injury during PICC insertion. How to care for your PICC Please follow the specific guidelines provided by your health care provider. Preventing infection You and any caregivers should wash your hands often with soap and water for at least 20 seconds. Wash hands: Before touching the PICC or the infusion device. Before changing a  bandage (dressing). Do not change the dressing unless you have been taught to do so and have shown you are able to change it safely. Flush the PICC as told. Tell your health care provider right away if the PICC is hard to flush or does not flush. Do not use force to flush the PICC. Use clean and germ-free (sterile) supplies only. Keep the supplies in a dry place. Do not reuse needles, syringes, or any other supplies. Reusing supplies can lead to infection. Keep the PICC dressing dry and secure it with tape if the edges stop sticking to your skin. Check your PICC insertion site every day for signs of infection. Check for: Redness, swelling, or pain. Fluid or blood. Warmth. Pus or a bad smell. Preventing other problems Do not use a syringe that is less than 10 mL to flush the PICC. Do not have your blood pressure checked on the arm in which the PICC is placed. Do not ever pull or tug on the PICC. Keep it secured to your arm with tape or a stretch wrap when not in use. Do not take the PICC out yourself. Only a trained health care provider should remove the PICC. Keep pets and children away from your PICC. How to care for your PICC dressing Keep your PICC dressing clean and dry to prevent infection. Do not take baths, swim, or use a hot tub until your health care provider approves. Ask your health care provider if you can take   showers. You may only be allowed to take sponge baths. When you are allowed to shower: Ask your health care provider to teach you how to wrap the PICC. Cover the PICC with clear plastic wrap and tape to keep it dry while showering. Follow instructions from your health care provider about how to take care of your insertion site and dressing. Make sure you: Wash your hands with soap and water for at least 20 seconds before and after you change your dressing. If soap and water are not available, use hand sanitizer. Change your dressing only if taught to do so by your health care  provider. Your PICC dressing needs to be changed if it becomes loose or wet. Leave stitches (sutures), skin glue, or adhesive strips in place. These skin closures may need to stay in place for 2 weeks or longer. If adhesive strip edges start to loosen and curl up, you may trim the loose edges. Do not remove adhesive strips completely unless your health care provider tells you to do that. Follow these instructions at home: Disposal of supplies Throw away any syringes in a disposal container that is meant for sharp items (sharps container). You can buy a sharps container from a pharmacy, or you can make one by using an empty, hard plastic bottle with a lid. Place any used dressings or infusion bags into a plastic bag. Throw that bag in the trash. General instructions  Always carry your PICC identification card or wear a medical alert bracelet. Keep the tube clamped at all times, unless it is being used. Always carry a smooth-edge clamp with you to clamp the PICC if it breaks. Do not use scissors or sharp objects near the tube. You may bend your arm and move it freely. If your PICC is near or at the bend of your elbow, avoid activity with repeated motion at the elbow. Avoid lifting heavy objects as told by your health care provider. Keep all follow-up visits. This is important. You will need to have your PICC dressing changed at least once a week. Contact a health care provider if: You have pain in your arm, ear, face, or teeth. You have a fever or chills. You have redness, swelling, or pain around the insertion site. You have fluid or blood coming from the insertion site. Your insertion site feels warm to the touch. You have pus or a bad smell coming from the insertion site. Your skin feels hard and raised around the insertion site. Your PICC dressing has gotten wet or is coming off and you have not been taught how to change it. Get help right away if: You have problems with your PICC, such as  your PICC: Was tugged or pulled and has partially come out. Do not  push the PICC back in. Cannot be flushed, is hard to flush, or leaks around the insertion site when it is flushed. Makes a flushing sound when it is flushed. Appears to have a hole or tear. Is accidentally pulled all the way out. If this happens, cover the insertion site with a gauze dressing. Do not throw the PICC away. Your health care provider will need to check it to be sure the entire catheter came out. You feel your heart racing or skipping beats, or you have chest pain. You have shortness of breath or trouble breathing. You have swelling, redness, warmth, or pain in the arm in which the PICC is placed. You have a red streak going up your arm that   starts under the PICC dressing. These symptoms may be an emergency. Get help right away. Call 911. Do not wait to see if the symptoms will go away. Do not drive yourself to the hospital. Summary A peripherally inserted central catheter (PICC) is a long, thin, flexible tube (catheter) that is put into a vein in the arm or leg. If cared for properly, a PICC can remain in place for many months. Having a PICC can allow you to go home from the hospital sooner and continue treatment at home. The PICC is inserted using a germ-free (sterile) technique by a specially trained health care provider. Only a trained health care provider should remove it. Do not have your blood pressure checked on the arm in which your PICC is placed. Always keep your PICC identification card with you. This information is not intended to replace advice given to you by your health care provider. Make sure you discuss any questions you have with your health care provider. Document Revised: 04/27/2021 Document Reviewed: 04/27/2021 Elsevier Patient Education  2023 Elsevier Inc.  

## 2023-03-02 ENCOUNTER — Inpatient Hospital Stay (HOSPITAL_BASED_OUTPATIENT_CLINIC_OR_DEPARTMENT_OTHER): Payer: No Typology Code available for payment source | Admitting: Nurse Practitioner

## 2023-03-02 ENCOUNTER — Other Ambulatory Visit: Payer: Self-pay

## 2023-03-02 ENCOUNTER — Telehealth: Payer: Self-pay | Admitting: Oncology

## 2023-03-02 ENCOUNTER — Inpatient Hospital Stay: Payer: No Typology Code available for payment source

## 2023-03-02 ENCOUNTER — Telehealth: Payer: Self-pay

## 2023-03-02 ENCOUNTER — Telehealth: Payer: Self-pay | Admitting: *Deleted

## 2023-03-02 ENCOUNTER — Ambulatory Visit (INDEPENDENT_AMBULATORY_CARE_PROVIDER_SITE_OTHER): Payer: No Typology Code available for payment source | Admitting: Internal Medicine

## 2023-03-02 ENCOUNTER — Encounter: Payer: Self-pay | Admitting: Internal Medicine

## 2023-03-02 VITALS — BP 88/59 | HR 102 | Temp 97.9°F | Resp 16 | Ht 65.0 in | Wt 201.0 lb

## 2023-03-02 DIAGNOSIS — C3490 Malignant neoplasm of unspecified part of unspecified bronchus or lung: Secondary | ICD-10-CM

## 2023-03-02 DIAGNOSIS — B9561 Methicillin susceptible Staphylococcus aureus infection as the cause of diseases classified elsewhere: Secondary | ICD-10-CM | POA: Diagnosis not present

## 2023-03-02 DIAGNOSIS — Z5112 Encounter for antineoplastic immunotherapy: Secondary | ICD-10-CM | POA: Diagnosis not present

## 2023-03-02 DIAGNOSIS — R7881 Bacteremia: Secondary | ICD-10-CM | POA: Diagnosis not present

## 2023-03-02 LAB — CMP (CANCER CENTER ONLY)
ALT: 7 U/L (ref 0–44)
AST: 15 U/L (ref 15–41)
Albumin: 2.7 g/dL — ABNORMAL LOW (ref 3.5–5.0)
Alkaline Phosphatase: 101 U/L (ref 38–126)
Anion gap: 7 (ref 5–15)
BUN: 12 mg/dL (ref 8–23)
CO2: 31 mmol/L (ref 22–32)
Calcium: 7.7 mg/dL — ABNORMAL LOW (ref 8.9–10.3)
Chloride: 102 mmol/L (ref 98–111)
Creatinine: 0.95 mg/dL (ref 0.44–1.00)
GFR, Estimated: >60 mL/min (ref >60)
Glucose, Bld: 103 mg/dL — ABNORMAL HIGH (ref 70–99)
Potassium: 3.3 mmol/L — ABNORMAL LOW (ref 3.5–5.1)
Sodium: 140 mmol/L (ref 135–145)
Total Bilirubin: 0.4 mg/dL (ref 0.3–1.2)
Total Protein: 6.2 g/dL — ABNORMAL LOW (ref 6.5–8.1)

## 2023-03-02 LAB — MAGNESIUM: Magnesium: 1.4 mg/dL — ABNORMAL LOW (ref 1.7–2.4)

## 2023-03-02 LAB — CBC WITH DIFFERENTIAL (CANCER CENTER ONLY)
Abs Immature Granulocytes: 0.32 10*3/uL — ABNORMAL HIGH (ref 0.00–0.07)
Basophils Absolute: 0.1 10*3/uL (ref 0.0–0.1)
Basophils Relative: 1 %
Eosinophils Absolute: 0 10*3/uL (ref 0.0–0.5)
Eosinophils Relative: 0 %
HCT: 28.5 % — ABNORMAL LOW (ref 36.0–46.0)
Hemoglobin: 8.9 g/dL — ABNORMAL LOW (ref 12.0–15.0)
Immature Granulocytes: 5 %
Lymphocytes Relative: 21 %
Lymphs Abs: 1.4 10*3/uL (ref 0.7–4.0)
MCH: 30.6 pg (ref 26.0–34.0)
MCHC: 31.2 g/dL (ref 30.0–36.0)
MCV: 97.9 fL (ref 80.0–100.0)
Monocytes Absolute: 0.9 10*3/uL (ref 0.1–1.0)
Monocytes Relative: 13 %
Neutro Abs: 4 10*3/uL (ref 1.7–7.7)
Neutrophils Relative %: 60 %
Platelet Count: 46 10*3/uL — ABNORMAL LOW (ref 150–400)
RBC: 2.91 MIL/uL — ABNORMAL LOW (ref 3.87–5.11)
RDW: 18 % — ABNORMAL HIGH (ref 11.5–15.5)
WBC Count: 6.7 10*3/uL (ref 4.0–10.5)
nRBC: 0.5 % — ABNORMAL HIGH (ref 0.0–0.2)

## 2023-03-02 MED ORDER — HEPARIN SOD (PORK) LOCK FLUSH 100 UNIT/ML IV SOLN
250.0000 [IU] | INTRAVENOUS | Status: AC | PRN
  Administered 2023-03-02: 250 [IU]

## 2023-03-02 MED ORDER — SODIUM CHLORIDE 0.9% FLUSH
10.0000 mL | INTRAVENOUS | Status: AC | PRN
  Administered 2023-03-02: 10 mL

## 2023-03-02 MED ORDER — SODIUM CHLORIDE 0.9 % IV SOLN: Freq: Once | INTRAVENOUS | Status: AC

## 2023-03-02 MED ORDER — SODIUM CHLORIDE 0.9 % IV SOLN: INTRAVENOUS | Status: DC

## 2023-03-02 MED ORDER — MAGNESIUM SULFATE 4 GM/100ML IV SOLN
4.0000 g | Freq: Once | INTRAVENOUS | Status: AC
  Administered 2023-03-02: 4 g via INTRAVENOUS
  Filled 2023-03-02: qty 100

## 2023-03-02 MED ORDER — POTASSIUM CHLORIDE 10 MEQ/100ML IV SOLN
10.0000 meq | INTRAVENOUS | Status: AC
  Administered 2023-03-02 (×2): 10 meq via INTRAVENOUS
  Filled 2023-03-02: qty 100

## 2023-03-02 NOTE — Telephone Encounter (Signed)
-----   Message from Rana Snare, NP sent at 03/01/2023  4:54 PM EDT ----- Please let her know white count and platelets are low, review precautions.  Need to repeat CBC 03/02/2023.

## 2023-03-02 NOTE — Telephone Encounter (Signed)
Patient gave verbal understanding and schedule to come in repeat CBC
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
## 2023-03-02 NOTE — Telephone Encounter (Signed)
Patient aware of added appointment

## 2023-03-02 NOTE — Telephone Encounter (Signed)
Called to request extension in her physical therapy visits to weekly x 3 beginning 03/05/23. Verbal OK provided.

## 2023-03-02 NOTE — Progress Notes (Signed)
[facility]  Patient Active Problem List   Diagnosis Date Noted   MSSA bacteremia 01/15/2023   Bloodstream infection due to Port-A-Cath 01/15/2023   Pleural effusion, malignant 01/13/2023   Neutropenia (HCC) 12/08/2022   Cellulitis 12/08/2022   Recurrent right pleural effusion 11/16/2022   Acute pulmonary embolism (HCC) 11/12/2022   Acute hypoxemic respiratory failure (HCC) 11/12/2022   Metastatic non-small cell lung cancer (HCC) 11/12/2022   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   Rib fracture 11/12/2022   Anxiety 11/12/2022   CKD (chronic kidney disease), stage III (HCC) 11/12/2022   Chronic HFrEF (heart failure with reduced ejection fraction) (HCC) 11/12/2022   Metastasis to spinal column (HCC) 10/03/2022   Primary cancer of right middle lobe of lung (HCC) 09/29/2022   Intracranial bleed (HCC) 09/13/2022   Lung mass 09/13/2022   Hypokalemia 09/13/2022   Tachycardia 08/29/2021   Preop cardiovascular exam 08/29/2020   Symptomatic cholelithiasis 06/09/2020   S/P laparoscopic cholecystectomy 06/09/2020   Chest pain with moderate risk for cardiac etiology 02/14/2018   Right upper quadrant pain 02/12/2018   Tremor 02/11/2014   Non Hodgkin's lymphoma (HCC) 08/18/2013   Neck pain on left side 05/30/2013   Dizziness 05/30/2013   Moderate obesity 05/21/2013   Lung crackles 05/21/2013   Dyspnea on exertion 05/21/2013   Nonischemic cardiomyopathy (HCC)    Essential hypertension    Dyslipidemia, goal LDL below 100       Subjective:    Patient ID: Crystal [name], female    DOB: [birth year], 77 y.o.   MRN: [id number]  No chief complaint on file.   HPI:  Crystal [name] is a 77 y.o. female with metastatic nsclc on chemo, here for f/u mssa bacteremia   She had admission 3/22 for mssa bsi. Presumed port related and port removed 01/15/23. Tee negative for endocarditis She had a pleurex catheter for recurrent pleural effusion and that was  removed also on 01/13/23  She had plan to treat 4 weeks cefazolin on 02/12/23  She is doing well today no focal pain, fever, chill   She has LUE picc placed during that admission  Her blood pressure is low today. It was ok yesterday   She has been feeling tired since the admission  The last few weeks feels her health is stable  Her last chemo was a week ago  No black bloody stool   Allergies  Allergen Reactions   Simvastatin Other (See Comments)    Leg cramps   Zoloft [Sertraline] Nausea Only   Codeine Other (See Comments)    Bad Headaches   Coreg Other (See Comments)    "Made my legs hurt"   Lisinopril Cough   Tizanidine Hcl Rash and Other (See Comments)    Hypotension, also      Outpatient Medications Prior to Visit  Medication Sig Dispense Refill   acetaminophen (TYLENOL) 325 MG tablet Take 2 tablets (650 mg total) by mouth every 6 (six) hours as needed for mild pain or moderate pain.     apixaban (ELIQUIS) 5 MG TABS tablet TAKE 1 TABLET(5 MG) BY MOUTH TWICE DAILY 60 tablet 1   ceFAZolin (ANCEF) 10 g injection      diphenhydrAMINE (BENADRYL) 25 MG tablet Take 50 mg by mouth at bedtime.     folic acid (FOLVITE) 1 MG tablet Take 1 tablet (1 mg total) by mouth daily. 30 tablet 5   LORazepam (ATIVAN) 0.5 MG tablet Take 1 tablet (  0.5 mg total) by mouth 2 (two) times daily as needed for anxiety. 60 tablet 0   magnesium oxide (MAG-OX) 400 (240 Mg) MG tablet TAKE 1 TABLET(400 MG) BY MOUTH DAILY (Patient taking differently: Take 400 mg by mouth daily. TAKE 1 TABLET(400 MG) BY MOUTH DAILY) 30 tablet 1   metoprolol tartrate (LOPRESSOR) 100 MG tablet Take 1 tablet (100 mg total) by mouth 2 (two) times daily. (Patient taking differently: Take 100 mg by mouth See admin instructions. Take 100 mg by mouth two times a day and hold if Systolic number is less than 100) 60 tablet 0   ondansetron (ZOFRAN) 8 MG tablet Take 1 tablet (8 mg total) by mouth every 8 (eight) hours as needed for  nausea or vomiting (starting using day 3 after treatment as needed for nausea/vomiting). (Patient not taking: Reported on 01/31/2023) 20 tablet 0   oxyCODONE (OXY IR/ROXICODONE) 5 MG immediate release tablet Take 1 tablet (5 mg total) by mouth every 6 (six) hours as needed for severe pain. 60 tablet 0   pantoprazole (PROTONIX) 40 MG tablet TAKE 1 TABLET(40 MG) BY MOUTH TWICE DAILY BEFORE A MEAL 180 tablet 0   potassium chloride (KLOR-CON M) 10 MEQ tablet TAKE 3 TABLETS(30 MEQ) BY MOUTH DAILY 270 tablet 0   prochlorperazine (COMPAZINE) 10 MG tablet Take 1 tablet (10 mg total) by mouth every 6 (six) hours as needed for nausea or vomiting. (Patient not taking: Reported on 01/31/2023) 30 tablet 1   No facility-administered medications prior to visit.     Social History   Socioeconomic History   Marital status: Widowed    Spouse name: Not on file   Number of children: 3   Years of education: hs   Highest education level: Not on file  Occupational History   Occupation: Retired  Tobacco Use   Smoking status: Never   Smokeless tobacco: Never  Vaping Use   Vaping Use: Never used  Substance and Sexual Activity   Alcohol use: Yes    Comment: social    Drug use: No   Sexual activity: Not on file  Other Topics Concern   Not on file  Social History Narrative   She is a widow mother of 2, with 1 child is deceased. She has 2 grandchildren. She is trying to get some walking in town and can do about 20-25 minutes his another 30 minutes she pushes a more night gets short of breath.   She is at retired [employer], he simply laid off after 45 years.   She usually comes accompanied by her sister, who is also my patient.   She never was a smoker, and only occasionally has a social alcoholic beverage.   Social Determinants of Health   Financial Resource Strain: Not on file  Food Insecurity: No Food Insecurity (01/13/2023)   Hunger Vital Sign    Worried About Running Out of Food in the Last  Year: Never true    Ran Out of Food in the Last Year: Never true  Transportation Needs: No Transportation Needs (01/13/2023)   PRAPARE - Administrator, Civil Service (Medical): No    Lack of Transportation (Non-Medical): No  Physical Activity: Not on file  Stress: Not on file  Social Connections: Not on file  Intimate Partner Violence: Not At Risk (01/13/2023)   Humiliation, Afraid, Rape, and Kick questionnaire    Fear of Current or Ex-Partner: No    Emotionally Abused: No    Physically  Abused: No    Sexually Abused: No      Review of Systems    All other ros negative  Objective:    BP (!) 83/56   Pulse (!) 102   Resp 16   Ht 5\' 5"  (1.651 m)   Wt 201 lb (91.2 kg)   SpO2 96%   BMI 33.45 kg/m  Nursing note and vital signs reviewed.  Physical Exam    General/constitutional: no distress, pleasant HEENT: Normocephalic, PER, Conj Clear, EOMI, Oropharynx clear Neck supple CV: rrr no mrg Lungs: clear to auscultation, normal respiratory effort Abd: Soft, Nontender Ext: no edema Skin: No Rash Neuro: nonfocal MSK: no peripheral joint swelling/tenderness/warmth; back spines nontender   Central line presence: lue picc site no erythema/tenderness/purulence  Labs: Lab Results  Component Value Date   WBC 2.7 (L) 03/01/2023   HGB 9.1 (L) 03/01/2023   HCT 29.3 (L) 03/01/2023   MCV 97.7 03/01/2023   PLT 42 (L) 03/01/2023   Last metabolic panel Lab Results  Component Value Date   GLUCOSE 113 (H) 03/01/2023   NA 141 03/01/2023   K 3.4 (L) 03/01/2023   CL 103 03/01/2023   CO2 31 03/01/2023   BUN 13 03/01/2023   CREATININE 0.93 03/01/2023   GFRNONAA >60 03/01/2023   CALCIUM 7.7 (L) 03/01/2023   PROT 6.6 02/21/2023   ALBUMIN 2.7 (L) 02/21/2023   LABGLOB 2.9 09/04/2022   AGRATIO 1.2 09/04/2022   BILITOT 0.3 02/21/2023   ALKPHOS 99 02/21/2023   AST 15 02/21/2023   ALT <5 02/21/2023   ANIONGAP 7 03/01/2023     Micro:  Serology:  Imaging:  Assessment & Plan:   Problem List Items Addressed This Visit       Respiratory   Metastatic non-small cell lung cancer (HCC)   Relevant Orders   CBC   COMPLETE METABOLIC PANEL WITH GFR   C-reactive protein     Other   MSSA bacteremia - Primary   Relevant Orders   CBC   COMPLETE METABOLIC PANEL WITH GFR   C-reactive protein      No orders of the defined types were placed in this encounter.    #mssa bsi complicated  Negative tee Port removed 1 month out of treamtent almost and doing well from id standpoint Acute bp low unrelated to infection   #lung cancer On chemo Team asked to repeat cbc today  #low bp Advise holding lopressor, monitor bp, and f/u pcp (she'll try today)    Follow-up: Return if symptoms worsen or fail to improve.      Raymondo Band, MD [facility] 03/02/2023, 9:24 AM

## 2023-03-02 NOTE — Patient Instructions (Addendum)
I don't think you have active staph infection any more  Will check blood tests as requested by the cancer center   I would be warry of acutely lower blood pressure than a little bit more elevated. Please hold lopressor for today and let your primary care doctor knows. Also check your blood pressure every day for this coming week  If fever, chill, focal joint pain like back, knees, hips, give our clinic a call

## 2023-03-02 NOTE — Patient Instructions (Signed)
Magnesium Sulfate Injection What is this medication? MAGNESIUM SULFATE (mag NEE zee um SUL fate) prevents and treats low levels of magnesium in your body. It may also be used to prevent and treat seizures during pregnancy in people with high blood pressure disorders, such as preeclampsia or eclampsia. Magnesium plays an important role in maintaining the health of your muscles and nervous system. This medicine may be used for other purposes; ask your health care provider or pharmacist if you have questions. What should I tell my care team before I take this medication? They need to know if you have any of these conditions: Heart disease History of irregular heart beat Kidney disease An unusual or allergic reaction to magnesium sulfate, medications, foods, dyes, or preservatives Pregnant or trying to get pregnant Breast-feeding How should I use this medication? This medication is for infusion into a vein. It is given in a hospital or clinic setting. Talk to your care team about the use of this medication in children. While this medication may be prescribed for selected conditions, precautions do apply. Overdosage: If you think you have taken too much of this medicine contact a poison control center or emergency room at once. NOTE: This medicine is only for you. Do not share this medicine with others. What if I miss a dose? This does not apply. What may interact with this medication? Certain medications for anxiety or sleep Certain medications for seizures, such phenobarbital Digoxin Medications that relax muscles for surgery Narcotic medications for pain This list may not describe all possible interactions. Give your health care provider a list of all the medicines, herbs, non-prescription drugs, or dietary supplements you use. Also tell them if you smoke, drink alcohol, or use illegal drugs. Some items may interact with your medicine. What should I watch for while using this  medication? Your condition will be monitored carefully while you are receiving this medication. You may need blood work done while you are receiving this medication. What side effects may I notice from receiving this medication? Side effects that you should report to your care team as soon as possible: Allergic reactions--skin rash, itching, hives, swelling of the face, lips, tongue, or throat High magnesium level--confusion, drowsiness, facial flushing, redness, sweating, muscle weakness, fast or irregular heartbeat, trouble breathing Low blood pressure--dizziness, feeling faint or lightheaded, blurry vision Side effects that usually do not require medical attention (report to your care team if they continue or are bothersome): Headache Nausea This list may not describe all possible side effects. Call your doctor for medical advice about side effects. You may report side effects to FDA at 1-800-FDA-1088. Where should I keep my medication? This medication is given in a hospital or clinic and will not be stored at home. NOTE: This sheet is a summary. It may not cover all possible information. If you have questions about this medicine, talk to your doctor, pharmacist, or health care provider.  2023 Elsevier/Gold Standard (2013-02-14 00:00:00)  Potassium Chloride Injection What is this medication? POTASSIUM CHLORIDE (poe TASS i um KLOOR ide) prevents and treats low levels of potassium in your body. Potassium plays an important role in maintaining the health of your kidneys, heart, muscles, and nervous system. This medicine may be used for other purposes; ask your health care provider or pharmacist if you have questions. COMMON BRAND NAME(S): PROAMP What should I tell my care team before I take this medication? They need to know if you have any of these conditions: Addison disease Dehydration Diabetes (high  blood sugar) Heart disease High levels of potassium in the blood Irregular heartbeat  or rhythm Kidney disease Large areas of burned skin An unusual or allergic reaction to potassium, other medications, foods, dyes, or preservatives Pregnant or trying to get pregnant Breast-feeding How should I use this medication? This medication is injected into a vein. It is given in a hospital or clinic setting. Talk to your care team about the use of this medication in children. Special care may be needed. Overdosage: If you think you have taken too much of this medicine contact a poison control center or emergency room at once. NOTE: This medicine is only for you. Do not share this medicine with others. What if I miss a dose? This does not apply. This medication is not for regular use. What may interact with this medication? Do not take this medication with any of the following: Certain diuretics, such as spironolactone, triamterene Eplerenone Sodium polystyrene sulfonate This medication may also interact with the following: Certain medications for blood pressure or heart disease, such as lisinopril, losartan, quinapril, valsartan Medications that lower your chance of fighting infection, such as cyclosporine, tacrolimus NSAIDs, medications for pain and inflammation, such as ibuprofen or naproxen Other potassium supplements Salt substitutes This list may not describe all possible interactions. Give your health care provider a list of all the medicines, herbs, non-prescription drugs, or dietary supplements you use. Also tell them if you smoke, drink alcohol, or use illegal drugs. Some items may interact with your medicine. What should I watch for while using this medication? Visit your care team for regular checks on your progress. Tell your care team if your symptoms do not start to get better or if they get worse. You may need blood work while you are taking this medication. Avoid salt substitutes unless you are told otherwise by your care team. What side effects may I notice from  receiving this medication? Side effects that you should report to your care team as soon as possible: Allergic reactions--skin rash, itching, hives, swelling of the face, lips, tongue, or throat High potassium level--muscle weakness, fast or irregular heartbeat Side effects that usually do not require medical attention (report to your care team if they continue or are bothersome): Diarrhea Nausea Stomach pain Vomiting This list may not describe all possible side effects. Call your doctor for medical advice about side effects. You may report side effects to FDA at 1-800-FDA-1088. Where should I keep my medication? This medication is given in a hospital or clinic. It will not be stored at home. NOTE: This sheet is a summary. It may not cover all possible information. If you have questions about this medicine, talk to your doctor, pharmacist, or health care provider.  2023 Elsevier/Gold Standard (2007-11-30 00:00:00)  Rehydration, Older Adult  Rehydration is the replacement of fluids, salts, and minerals in the body (electrolytes) that are lost during dehydration. Dehydration is when there is not enough water or other fluids in the body. This happens when you lose more fluids than you take in. People who are age 78 or older have a higher risk of dehydration than younger adults. This is because in older age, the body: Is less able to maintain the right amount of water. Does not respond to temperature changes as well. Does not get a sense of thirst as easily or quickly. Other causes include: Not drinking enough fluids. This can occur when you are ill, when you forget to drink, or when you are doing activities that  require a lot of energy, especially in hot weather. Conditions that cause loss of water or other fluids. These include diarrhea, vomiting, sweating, or urinating a lot. Other illnesses, such as fever or infection. Certain medicines, such as those that remove excess fluid from the body  (diuretics). Symptoms of mild or moderate dehydration may include thirst, dry lips and mouth, and dizziness. Symptoms of severe dehydration may include increased heart rate, confusion, fainting, and not urinating. In severe cases, you may need to get fluids through an IV at the hospital. For mild or moderate cases, you can usually rehydrate at home by drinking certain fluids as told by your health care provider. What are the risks? Rehydration is usually safe. Taking in too much fluid (overhydration) can be a problem but is rare. Overhydration can cause an imbalance of electrolytes in the body, kidney failure, fluid in the lungs, or a decrease in salt (sodium) levels in the body. Supplies needed: You will need an oral rehydration solution (ORS) if your health care provider tells you to use one. This is a drink to treat dehydration. It can be found in pharmacies and retail stores. How to rehydrate Fluids Follow instructions from your health care provider about what to drink. The kind of fluid and the amount you should drink depend on your condition. In general, you should choose drinks that you prefer. If told by your health care provider, drink an ORS. Make an ORS by following instructions on the package. Start by drinking small amounts, about  cup (120 mL) every 5-10 minutes. Slowly increase how much you drink until you have taken in the amount recommended by your health care provider. Drink enough clear fluids to keep your urine pale yellow. If you were told to drink an ORS, finish it first, then start slowly drinking other clear fluids. Drink fluids such as: Water. This includes sparkling and flavored water. Drinking only water can lead to having too little sodium in your body (hyponatremia). Follow the advice of your health care provider. Water from ice chips you suck on. Fruit juice with water added to it(diluted). Sports drinks. Hot or cold herbal teas. Broth-based soups. Coffee. Milk or  milk products. Food Follow instructions from your health care provider about what to eat while you rehydrate. Your health care provider may recommend that you slowly begin eating regular foods in small amounts. Eat foods that contain a healthy balance of electrolytes, such as bananas, oranges, potatoes, tomatoes, and spinach. Avoid foods that are greasy or contain a lot of sugar. In some cases, you may get nutrition through a feeding tube that is passed through your nose and into your stomach (nasogastric tube, or NG tube). This may be done if you have uncontrolled vomiting or diarrhea. Drinks to avoid  Certain drinks may make dehydration worse. While you rehydrate, avoid drinking alcohol. How to tell if you are recovering from dehydration You may be getting better if: You are urinating more often than before you started rehydrating. Your urine is pale yellow. Your energy level improves. You vomit less often. You have diarrhea less often. Your appetite improves or returns to normal. You feel less dizzy or light-headed. Your skin tone and color start to look more normal. Follow these instructions at home: Take over-the-counter and prescription medicines only as told by your health care provider. Do not take sodium tablets. Doing this can lead to having too much sodium in your body (hypernatremia). Contact a health care provider if: You continue to have  symptoms of mild or moderate dehydration, such as: Thirst. Dry lips. Slightly dry mouth. Dizziness. Dark urine or less urine than usual. Muscle cramps. You continue to vomit or have diarrhea. Get help right away if: You have symptoms of dehydration that get worse. You have a fever. You have a severe headache. You have been vomiting and have problems, such as: Your vomiting gets worse. Your vomit includes blood or green matter (bile). You cannot eat or drink without vomiting. You have problems with urination or bowel movements,  such as: Diarrhea that gets worse. Blood in your stool (feces). This may cause stool to look black and tarry. Not urinating, or urinating only a small amount of very dark urine, within 6-8 hours. You have trouble breathing. You have symptoms that get worse with treatment. These symptoms may be an emergency. Get help right away. Call 911. Do not wait to see if the symptoms will go away. Do not drive yourself to the hospital. This information is not intended to replace advice given to you by your health care provider. Make sure you discuss any questions you have with your health care provider. Document Revised: 02/22/2022 Document Reviewed: 02/20/2022 Elsevier Patient Education  2023 [employer].

## 2023-03-05 ENCOUNTER — Telehealth: Payer: Self-pay

## 2023-03-05 ENCOUNTER — Emergency Department (HOSPITAL_COMMUNITY): Payer: No Typology Code available for payment source

## 2023-03-05 ENCOUNTER — Other Ambulatory Visit: Payer: Self-pay

## 2023-03-05 ENCOUNTER — Observation Stay (HOSPITAL_BASED_OUTPATIENT_CLINIC_OR_DEPARTMENT_OTHER)
Admission: EM | Admit: 2023-03-05 | Discharge: 2023-03-06 | Disposition: A | Discharge status: Home / Self Care | Payer: No Typology Code available for payment source | Source: Home / Self Care | Attending: Emergency Medicine | Admitting: Emergency Medicine

## 2023-03-05 DIAGNOSIS — I5032 Chronic diastolic (congestive) heart failure: Secondary | ICD-10-CM | POA: Diagnosis present

## 2023-03-05 DIAGNOSIS — Z7901 Long term (current) use of anticoagulants: Secondary | ICD-10-CM | POA: Insufficient documentation

## 2023-03-05 DIAGNOSIS — Z79899 Other long term (current) drug therapy: Secondary | ICD-10-CM | POA: Insufficient documentation

## 2023-03-05 DIAGNOSIS — Z86718 Personal history of other venous thrombosis and embolism: Secondary | ICD-10-CM | POA: Insufficient documentation

## 2023-03-05 DIAGNOSIS — I700 Atherosclerosis of aorta: Secondary | ICD-10-CM | POA: Diagnosis not present

## 2023-03-05 DIAGNOSIS — N3000 Acute cystitis without hematuria: Secondary | ICD-10-CM | POA: Insufficient documentation

## 2023-03-05 DIAGNOSIS — I130 Hypertensive heart and chronic kidney disease with heart failure and stage 1 through stage 4 chronic kidney disease, or unspecified chronic kidney disease: Secondary | ICD-10-CM | POA: Insufficient documentation

## 2023-03-05 DIAGNOSIS — N1831 Chronic kidney disease, stage 3a: Secondary | ICD-10-CM | POA: Insufficient documentation

## 2023-03-05 DIAGNOSIS — Z853 Personal history of malignant neoplasm of breast: Secondary | ICD-10-CM | POA: Insufficient documentation

## 2023-03-05 DIAGNOSIS — Z86711 Personal history of pulmonary embolism: Secondary | ICD-10-CM | POA: Insufficient documentation

## 2023-03-05 DIAGNOSIS — E785 Hyperlipidemia, unspecified: Secondary | ICD-10-CM | POA: Diagnosis present

## 2023-03-05 DIAGNOSIS — C3490 Malignant neoplasm of unspecified part of unspecified bronchus or lung: Secondary | ICD-10-CM | POA: Diagnosis present

## 2023-03-05 DIAGNOSIS — I5022 Chronic systolic (congestive) heart failure: Secondary | ICD-10-CM | POA: Diagnosis present

## 2023-03-05 DIAGNOSIS — R002 Palpitations: Secondary | ICD-10-CM | POA: Diagnosis not present

## 2023-03-05 DIAGNOSIS — Z8572 Personal history of non-Hodgkin lymphomas: Secondary | ICD-10-CM | POA: Insufficient documentation

## 2023-03-05 DIAGNOSIS — S066X0A Traumatic subarachnoid hemorrhage without loss of consciousness, initial encounter: Secondary | ICD-10-CM | POA: Diagnosis not present

## 2023-03-05 DIAGNOSIS — R079 Chest pain, unspecified: Secondary | ICD-10-CM | POA: Diagnosis not present

## 2023-03-05 DIAGNOSIS — R000 Tachycardia, unspecified: Secondary | ICD-10-CM | POA: Insufficient documentation

## 2023-03-05 DIAGNOSIS — D630 Anemia in neoplastic disease: Secondary | ICD-10-CM | POA: Diagnosis not present

## 2023-03-05 DIAGNOSIS — E669 Obesity, unspecified: Secondary | ICD-10-CM | POA: Diagnosis present

## 2023-03-05 DIAGNOSIS — T80211D Bloodstream infection due to central venous catheter, subsequent encounter: Secondary | ICD-10-CM | POA: Diagnosis not present

## 2023-03-05 DIAGNOSIS — Z66 Do not resuscitate: Secondary | ICD-10-CM | POA: Insufficient documentation

## 2023-03-05 DIAGNOSIS — J9601 Acute respiratory failure with hypoxia: Secondary | ICD-10-CM | POA: Diagnosis not present

## 2023-03-05 DIAGNOSIS — C50919 Malignant neoplasm of unspecified site of unspecified female breast: Secondary | ICD-10-CM | POA: Diagnosis not present

## 2023-03-05 DIAGNOSIS — C8290 Follicular lymphoma, unspecified, unspecified site: Secondary | ICD-10-CM | POA: Diagnosis not present

## 2023-03-05 DIAGNOSIS — C8599 Non-Hodgkin lymphoma, unspecified, extranodal and solid organ sites: Secondary | ICD-10-CM | POA: Diagnosis not present

## 2023-03-05 DIAGNOSIS — R531 Weakness: Secondary | ICD-10-CM | POA: Diagnosis not present

## 2023-03-05 DIAGNOSIS — C7951 Secondary malignant neoplasm of bone: Secondary | ICD-10-CM | POA: Diagnosis not present

## 2023-03-05 DIAGNOSIS — R918 Other nonspecific abnormal finding of lung field: Secondary | ICD-10-CM | POA: Diagnosis not present

## 2023-03-05 DIAGNOSIS — R7989 Other specified abnormal findings of blood chemistry: Secondary | ICD-10-CM | POA: Diagnosis not present

## 2023-03-05 DIAGNOSIS — J90 Pleural effusion, not elsewhere classified: Secondary | ICD-10-CM | POA: Diagnosis not present

## 2023-03-05 DIAGNOSIS — J910 Malignant pleural effusion: Secondary | ICD-10-CM | POA: Diagnosis not present

## 2023-03-05 DIAGNOSIS — C7800 Secondary malignant neoplasm of unspecified lung: Secondary | ICD-10-CM | POA: Diagnosis not present

## 2023-03-05 DIAGNOSIS — N183 Chronic kidney disease, stage 3 unspecified: Secondary | ICD-10-CM | POA: Insufficient documentation

## 2023-03-05 DIAGNOSIS — E668 Other obesity: Secondary | ICD-10-CM | POA: Insufficient documentation

## 2023-03-05 DIAGNOSIS — I10 Essential (primary) hypertension: Secondary | ICD-10-CM | POA: Diagnosis present

## 2023-03-05 DIAGNOSIS — A4101 Sepsis due to Methicillin susceptible Staphylococcus aureus: Secondary | ICD-10-CM | POA: Diagnosis not present

## 2023-03-05 DIAGNOSIS — R55 Syncope and collapse: Secondary | ICD-10-CM | POA: Diagnosis not present

## 2023-03-05 LAB — CBC WITH DIFFERENTIAL/PLATELET
Abs Immature Granulocytes: 0.37 10*3/uL — ABNORMAL HIGH (ref 0.00–0.07)
Basophils Absolute: 0.1 10*3/uL (ref 0.0–0.1)
Basophils Relative: 1 %
Eosinophils Absolute: 0 10*3/uL (ref 0.0–0.5)
Eosinophils Relative: 0 %
HCT: 28.1 % — ABNORMAL LOW (ref 36.0–46.0)
Hemoglobin: 8.7 g/dL — ABNORMAL LOW (ref 12.0–15.0)
Immature Granulocytes: 3 %
Lymphocytes Relative: 13 %
Lymphs Abs: 1.9 10*3/uL (ref 0.7–4.0)
MCH: 30.9 pg (ref 26.0–34.0)
MCHC: 31 g/dL (ref 30.0–36.0)
MCV: 99.6 fL (ref 80.0–100.0)
Monocytes Absolute: 1.2 10*3/uL — ABNORMAL HIGH (ref 0.1–1.0)
Monocytes Relative: 8 %
Neutro Abs: 10.9 10*3/uL — ABNORMAL HIGH (ref 1.7–7.7)
Neutrophils Relative %: 75 %
Platelets: 77 10*3/uL — ABNORMAL LOW (ref 150–400)
RBC: 2.82 MIL/uL — ABNORMAL LOW (ref 3.87–5.11)
RDW: 19.6 % — ABNORMAL HIGH (ref 11.5–15.5)
WBC: 14.5 10*3/uL — ABNORMAL HIGH (ref 4.0–10.5)
nRBC: 0.3 % — ABNORMAL HIGH (ref 0.0–0.2)

## 2023-03-05 LAB — COMPREHENSIVE METABOLIC PANEL
ALT: 10 U/L (ref 0–44)
AST: 19 U/L (ref 15–41)
Albumin: 2.4 g/dL — ABNORMAL LOW (ref 3.5–5.0)
Alkaline Phosphatase: 112 U/L (ref 38–126)
Anion gap: 7 (ref 5–15)
BUN: 8 mg/dL (ref 8–23)
CO2: 27 mmol/L (ref 22–32)
Calcium: 7.7 mg/dL — ABNORMAL LOW (ref 8.9–10.3)
Chloride: 101 mmol/L (ref 98–111)
Creatinine, Ser: 1.17 mg/dL — ABNORMAL HIGH (ref 0.44–1.00)
GFR, Estimated: 48 mL/min — ABNORMAL LOW (ref >60)
Glucose, Bld: 105 mg/dL — ABNORMAL HIGH (ref 70–99)
Potassium: 4 mmol/L (ref 3.5–5.1)
Sodium: 135 mmol/L (ref 135–145)
Total Bilirubin: 0.4 mg/dL (ref 0.3–1.2)
Total Protein: 6.5 g/dL (ref 6.5–8.1)

## 2023-03-05 LAB — TROPONIN I (HIGH SENSITIVITY)
Troponin I (High Sensitivity): 13 ng/L (ref <18)
Troponin I (High Sensitivity): 12 ng/L (ref <18)

## 2023-03-05 LAB — URINALYSIS, ROUTINE W REFLEX MICROSCOPIC
Bilirubin Urine: NEGATIVE
Glucose, UA: NEGATIVE mg/dL
Ketones, ur: NEGATIVE mg/dL
Nitrite: NEGATIVE
Protein, ur: NEGATIVE mg/dL
Specific Gravity, Urine: 1.009 (ref 1.005–1.030)
WBC, UA: >50 WBC/hpf (ref 0–5)
pH: 8 (ref 5.0–8.0)

## 2023-03-05 LAB — APTT: aPTT: 35 seconds (ref 24–36)

## 2023-03-05 LAB — PROTIME-INR
INR: 1.5 — ABNORMAL HIGH (ref 0.8–1.2)
Prothrombin Time: 17.9 seconds — ABNORMAL HIGH (ref 11.4–15.2)

## 2023-03-05 LAB — LACTIC ACID, PLASMA: Lactic Acid, Venous: 1.4 mmol/L (ref 0.5–1.9)

## 2023-03-05 LAB — D-DIMER, QUANTITATIVE: D-Dimer, Quant: 2.32 ug/mL-FEU — ABNORMAL HIGH (ref 0.00–0.50)

## 2023-03-05 MED ORDER — LACTATED RINGERS IV BOLUS (SEPSIS)
1000.0000 mL | Freq: Once | INTRAVENOUS | Status: AC
  Administered 2023-03-06: 1000 mL via INTRAVENOUS

## 2023-03-05 MED ORDER — IOHEXOL 350 MG/ML SOLN
75.0000 mL | Freq: Once | INTRAVENOUS | Status: AC | PRN
  Administered 2023-03-05: 75 mL via INTRAVENOUS

## 2023-03-05 MED ORDER — LACTATED RINGERS IV SOLN: INTRAVENOUS | Status: AC

## 2023-03-05 MED ORDER — LACTATED RINGERS IV BOLUS (SEPSIS)
1000.0000 mL | Freq: Once | INTRAVENOUS | Status: AC
  Administered 2023-03-05: 1000 mL via INTRAVENOUS

## 2023-03-05 MED ORDER — METOPROLOL TARTRATE 5 MG/5ML IV SOLN
5.0000 mg | Freq: Once | INTRAVENOUS | Status: AC
  Administered 2023-03-06: 5 mg via INTRAVENOUS
  Filled 2023-03-05: qty 5

## 2023-03-05 MED ORDER — AZITHROMYCIN 250 MG PO TABS
500.0000 mg | ORAL_TABLET | Freq: Once | ORAL | Status: AC
  Administered 2023-03-05: 500 mg via ORAL
  Filled 2023-03-05: qty 2

## 2023-03-05 MED ORDER — SODIUM CHLORIDE 0.9 % IV SOLN
1.0000 g | Freq: Once | INTRAVENOUS | Status: AC
  Administered 2023-03-05: 1 g via INTRAVENOUS
  Filled 2023-03-05: qty 10

## 2023-03-05 NOTE — ED Provider Notes (Signed)
[place] EMERGENCY DEPARTMENT AT [facility] Provider Note   CSN: [id number] Arrival date & time: 03/05/23  1744     History  Chief Complaint  Patient presents with   Palpitations    Crystal [name] is a 77 y.o. female.   Palpitations    Patient has a history of hypertension, nonischemic cardiomyopathy, lymphoma, breast cancer, hypercholesterolemia, reflux, IBS.  Patient presents to the ED for evaluation of palpitations and dizziness.  Patient states she started noticing the symptoms this morning.  Patient was assessed by physical therapy today..  They noticed that her heart rate was elevated.  They spoke with the doctor on-call and the patient was instructed to go to the ED for further evaluation.  Patient denies having any trouble with any fevers.  No coughing.  She does have some shortness of breath but this is unchanged from her baseline.  She has not noticed any new leg swelling.  Patient does not have a history of irregular heart rhythm as far she knows.  Home Medications Prior to Admission medications   Medication Sig Start Date End Date Taking? Authorizing Provider  apixaban (ELIQUIS) 5 MG TABS tablet TAKE 1 TABLET(5 MG) BY MOUTH TWICE DAILY Patient taking differently: Take 5 mg by mouth 2 (two) times daily. 02/26/23  Yes Ladene Artist, MD  acetaminophen (TYLENOL) 325 MG tablet Take 2 tablets (650 mg total) by mouth every 6 (six) hours as needed for mild pain or moderate pain. 09/14/22   Arrien, York Ram, MD  ceFAZolin (ANCEF) 10 g injection  01/30/23   [provider]  diphenhydrAMINE (BENADRYL) 25 MG tablet Take 50 mg by mouth at bedtime.    [provider]  folic acid (FOLVITE) 1 MG tablet Take 1 tablet (1 mg total) by mouth daily. 11/29/22   Rana Snare, NP  LORazepam (ATIVAN) 0.5 MG tablet Take 1 tablet (0.5 mg total) by mouth 2 (two) times daily as needed for anxiety. 02/14/23   Rana Snare, NP  magnesium oxide (MAG-OX) 400 (240  Mg) MG tablet TAKE 1 TABLET(400 MG) BY MOUTH DAILY Patient taking differently: Take 400 mg by mouth daily. TAKE 1 TABLET(400 MG) BY MOUTH DAILY 01/30/23   Ladene Artist, MD  metoprolol tartrate (LOPRESSOR) 100 MG tablet Take 1 tablet (100 mg total) by mouth 2 (two) times daily. Patient taking differently: Take 100 mg by mouth See admin instructions. Take 100 mg by mouth two times a day and hold if Systolic number is less than 100 11/17/22 12/17/22  Jerald Kief, MD  ondansetron (ZOFRAN) 8 MG tablet Take 1 tablet (8 mg total) by mouth every 8 (eight) hours as needed for nausea or vomiting (starting using day 3 after treatment as needed for nausea/vomiting). 11/27/22   Ladene Artist, MD  oxyCODONE (OXY IR/ROXICODONE) 5 MG immediate release tablet Take 1 tablet (5 mg total) by mouth every 6 (six) hours as needed for severe pain. 02/14/23   Rana Snare, NP  pantoprazole (PROTONIX) 40 MG tablet TAKE 1 TABLET(40 MG) BY MOUTH TWICE DAILY BEFORE A MEAL 02/28/23   Ladene Artist, MD  potassium chloride (KLOR-CON M) 10 MEQ tablet TAKE 3 TABLETS(30 MEQ) BY MOUTH DAILY 02/28/23   Ladene Artist, MD  prochlorperazine (COMPAZINE) 10 MG tablet Take 1 tablet (10 mg total) by mouth every 6 (six) hours as needed for nausea or vomiting. 11/27/22   Ladene Artist, MD      Allergies  Simvastatin, Zoloft [sertraline], Codeine, Coreg, Lisinopril, and Tizanidine hcl    Review of Systems   Review of Systems  Cardiovascular:  Positive for palpitations.    Physical Exam Updated Vital Signs BP (!) 128/97   Pulse (!) 127   Temp 98 F (36.7 C)   Resp (!) 21   Ht 1.651 m (5\' 5" )   Wt 91.2 kg   SpO2 (!) 88%   BMI 33.45 kg/m  Physical Exam Vitals and nursing note reviewed.  Constitutional:      General: She is not in acute distress.    Appearance: She is well-developed. She is not diaphoretic.  HENT:     Head: Normocephalic and atraumatic.     Right Ear: External ear normal.     Left Ear: External ear  normal.  Eyes:     General: No scleral icterus.       Right eye: No discharge.        Left eye: No discharge.     Conjunctiva/sclera: Conjunctivae normal.  Neck:     Trachea: No tracheal deviation.  Cardiovascular:     Rate and Rhythm: Regular rhythm. Tachycardia present.  Pulmonary:     Effort: Pulmonary effort is normal. No respiratory distress.     Breath sounds: Normal breath sounds. No stridor. No wheezing or rales.  Abdominal:     General: Bowel sounds are normal. There is no distension.     Palpations: Abdomen is soft.     Tenderness: There is no abdominal tenderness. There is no guarding or rebound.  Musculoskeletal:        General: No tenderness or deformity.     Cervical back: Neck supple.  Skin:    General: Skin is warm and dry.     Findings: No rash.  Neurological:     General: No focal deficit present.     Mental Status: She is alert.     Cranial Nerves: No cranial nerve deficit, dysarthria or facial asymmetry.     Sensory: No sensory deficit.     Motor: No abnormal muscle tone or seizure activity.     Coordination: Coordination normal.  Psychiatric:        Mood and Affect: Mood normal.     ED Results / Procedures / Treatments   Labs (all labs ordered are listed, but only abnormal results are displayed) Labs Reviewed  COMPREHENSIVE METABOLIC PANEL - Abnormal; Notable for the following components:      Result Value   Glucose, Bld 105 (*)    Creatinine, Ser 1.17 (*)    Calcium 7.7 (*)    Albumin 2.4 (*)    GFR, Estimated 48 (*)    All other components within normal limits  CBC WITH DIFFERENTIAL/PLATELET - Abnormal; Notable for the following components:   WBC 14.5 (*)    RBC 2.82 (*)    Hemoglobin 8.7 (*)    HCT 28.1 (*)    RDW 19.6 (*)    Platelets 77 (*)    nRBC 0.3 (*)    Neutro Abs 10.9 (*)    Monocytes Absolute 1.2 (*)    Abs Immature Granulocytes 0.37 (*)    All other components within normal limits  URINALYSIS, ROUTINE W REFLEX MICROSCOPIC -  Abnormal; Notable for the following components:   APPearance HAZY (*)    Hgb urine dipstick SMALL (*)    Leukocytes,Ua MODERATE (*)    Bacteria, UA FEW (*)    All other components within normal limits  D-DIMER, QUANTITATIVE - Abnormal; Notable for the following components:   D-Dimer, Quant 2.32 (*)    All other components within normal limits  PROTIME-INR - Abnormal; Notable for the following components:   Prothrombin Time 17.9 (*)    INR 1.5 (*)    All other components within normal limits  CULTURE, BLOOD (ROUTINE X 2)  CULTURE, BLOOD (ROUTINE X 2)  LACTIC ACID, PLASMA  APTT  HEPARIN LEVEL (UNFRACTIONATED)  TROPONIN I (HIGH SENSITIVITY)  TROPONIN I (HIGH SENSITIVITY)    EKG EKG Interpretation  Date/Time:  Monday Mar 05 2023 18:11:55 EDT Ventricular Rate:  131 PR Interval:  90 QRS Duration: 96 QT Interval:  314 QTC Calculation: 464 R Axis:   -31 Text Interpretation: Sinus tachycardia Atrial premature complex Left axis deviation No significant change since last tracing Confirmed by Linwood Dibbles [phone]) on 03/05/2023 6:16:06 PM  Radiology CT Angio Chest PE W and/or Wo Contrast  Result Date: 03/05/2023 CLINICAL DATA:  Positive D-dimer. Chest pain. History of lung cancer. EXAM: CT ANGIOGRAPHY CHEST WITH CONTRAST TECHNIQUE: Multidetector CT imaging of the chest was performed using the standard protocol during bolus administration of intravenous contrast. Multiplanar CT image reconstructions and MIPs were obtained to evaluate the vascular anatomy. RADIATION DOSE REDUCTION: This exam was performed according to the departmental dose-optimization program which includes automated exposure control, adjustment of the mA and/or kV according to patient size and/or use of iterative reconstruction technique. CONTRAST:  75mL OMNIPAQUE IOHEXOL 350 MG/ML SOLN COMPARISON:  Chest CT 01/12/2023 FINDINGS: Cardiovascular: Satisfactory opacification of the pulmonary arteries to the segmental level. No  evidence of pulmonary embolism. Normal heart size. No pericardial effusion. There are atherosclerotic calcifications of the aorta. Right chest port has been removed in the interval. There is a new left-sided central venous catheter with distal tip ending at the brachiocephalic SVC junction. Mediastinum/Nodes: Mildly enlarged subcarinal lymph node measuring 1 cm appears unchanged. Other nonenlarged, prominent mediastinal lymph nodes appear stable. The esophagus and thyroid gland are within normal limits. Lungs/Pleura: Small partially loculated right pleural effusion appears stable. Right middle lobe central mass near the hilum appears unchanged measuring proximally 3.8 x 2.5 cm. There is some new right lower lobe peribronchial wall thickening. Linear opacities in the right middle lobe, right lower lobe and lingula appear unchanged. There is some mild patchy ground-glass opacities in both lungs. Upper Abdomen: Cholecystectomy clips are present. Musculoskeletal: There is a small fluid collection in the anterior chest wall subcutaneous tissues measuring 1.2 x 1.1 cm likely related to prior chest port. There surgical clips and skin thickening in the right breast, unchanged. Right axillary surgical clips are again noted. No acute fractures are seen. Degenerative changes affect the spine. Review of the MIP images confirms the above findings. IMPRESSION: 1. No evidence for pulmonary embolism. 2. New right lower lobe peribronchial wall thickening worrisome for bronchitis. Metastatic disease not excluded. 3. Stable small partially loculated right pleural effusion. 4. Stable right middle lobe central mass. 5. Stable mediastinal lymphadenopathy. 6. New small fluid collection in the anterior chest wall likely related to prior chest port. 7. New left-sided central venous catheter with distal tip ending at the brachiocephalic SVC junction. Aortic Atherosclerosis (ICD10-I70.0). Electronically Signed   By: Darliss Cheney M.D.   On:  03/05/2023 22:49   CT Chest Wo Contrast  Result Date: 03/05/2023 CLINICAL DATA:  Abnormal x-ray EXAM: CT CHEST WITHOUT CONTRAST TECHNIQUE: Multidetector CT imaging of the chest was performed following the standard protocol without IV contrast. RADIATION DOSE REDUCTION:  This exam was performed according to the departmental dose-optimization program which includes automated exposure control, adjustment of the mA and/or kV according to patient size and/or use of iterative reconstruction technique. COMPARISON:  Chest x-ray 03/05/2023, CT chest 01/12/2023, 09/13/2022 FINDINGS: Cardiovascular: Limited evaluation without intravenous contrast. Left-sided central venous catheter tip at the cavoatrial region. Mild aortic atherosclerosis. No aneurysm. Normal cardiac size. Trace pericardial effusion Mediastinum/Nodes: Midline trachea. No thyroid mass. Subcentimeter mediastinal lymph nodes. Decreased right paratracheal node on series 3, image 20, measuring 7 mm, previously 9 mm. Esophagus within normal limits. Lungs/Pleura: Small slightly loculated right pleural effusion with interim removal of previously noted right-sided chest tube. Fluid quantity is overall decreased compared to prior chest CT. Pleural thickening is also decreased compared to prior chest CT. Vague right middle lobe lung mass measuring about 3.4 by 1.9 cm on series 3, image 30, previously 3.6 x 2.3 cm. Mild right middle lobe air bronchograms with overall slightly improved aeration of right middle lobe compared to the prior chest CT. Upper Abdomen: No acute finding. 13 mm hypodensity within the superior spleen series 3, image 42 better seen with contrast, stable with recent priors. Musculoskeletal: No acute or suspicious osseous abnormality. Old left ninth rib fracture. IMPRESSION: 1. Removal of right-sided chest tube since prior CT from March. Small slightly loculated right pleural effusion, overall decreased compared to the prior chest CT with decreased  pleural thickening. Slightly improved aeration of right middle lobe compared to prior chest CT. Vague right middle lobe lung mass appears slightly decreased in size compared to prior chest CT. 2. Decreased size of multiple mediastinal lymph nodes. 3. Aortic atherosclerosis. Aortic Atherosclerosis (ICD10-I70.0). Electronically Signed   By: Jasmine Pang M.D.   On: 03/05/2023 20:39   DG Chest Port 1 View  Result Date: 03/05/2023 CLINICAL DATA:  Questionable sepsis - evaluate for abnormality EXAM: PORTABLE CHEST 1 VIEW COMPARISON:  01/12/2023 FINDINGS: Interval removal of Port-A-Cath. Rounded opacity in the right mid to lower lung may reflect fluid within the fissure. Versus airspace opacity. Right pleural effusion or pleural thickening noted. Left lung clear. Heart and mediastinal contours within normal limits. Left PICC line in place with the tip in the SVC. IMPRESSION: Rounded opacity in the right mid to lower lung may reflect loculated fluid within the fissure or rounded opacity/airspace disease. Right pleural effusion versus pleural thickening. Findings could be further evaluated with chest CT if felt clinically indicated. Electronically Signed   By: Charlett Nose M.D.   On: 03/05/2023 19:14    Procedures Procedures    Medications Ordered in ED Medications  lactated ringers infusion ( Intravenous New Bag/Given 03/05/23 1844)  lactated ringers bolus 1,000 mL (has no administration in time range)  metoprolol tartrate (LOPRESSOR) injection 5 mg (has no administration in time range)  lactated ringers bolus 1,000 mL (0 mLs Intravenous Stopped 03/05/23 2037)  cefTRIAXone (ROCEPHIN) 1 g in sodium chloride 0.9 % 100 mL IVPB (0 g Intravenous Stopped 03/05/23 2143)  azithromycin (ZITHROMAX) tablet 500 mg (500 mg Oral Given 03/05/23 2035)  iohexol (OMNIPAQUE) 350 MG/ML injection 75 mL (75 mLs Intravenous Contrast Given 03/05/23 2220)    ED Course/ Medical Decision Making/ A&P Clinical Course as of 03/05/23  2339  Mon Mar 05, 2023  1948 CBC shows a leukocytosis.  Hemoglobin is stable at 8.7. [JK]  1948 Weight was decreased at 77 but similar compared to previous.  Initial lactic acid level normal.  Metabolic panel with increased creatinine [JK]  1948 Troponin I (High  Sensitivity) Troponin normal [JK]  1949 Chest x-ray with possible loculated fluid or airspace disease [JK]  2201 D-dimer, quantitative(!) D-dimer elevated 2.32 [JK]  2202 Urinalysis shows greater than 50 white blood cells moderate leukocyte esterase.  Possible UTI [JK]  2232 Patient's D-dimer is elevated.  This came back after her noncontrast CT.  I do think she would benefit from a contrast CT to rule out pulmonary embolism. [JK]  2232 Will start her on heparin empirically for possible PE she remains tachycardic.  Patient also does have possible UTI so we will plan on admission to the hospital for IV antibiotics and further monitoring. [JK]  2336 Discussed with Dr Imogene Burn regarding admission [JK]    Clinical Course User Index [JK] Linwood Dibbles, MD                             Medical Decision Making Problems Addressed: Acute cystitis without hematuria: acute illness or injury that poses a threat to life or bodily functions Malignant neoplasm of lung, unspecified laterality, unspecified part of lung [facility]): chronic illness or injury Tachycardia: acute illness or injury  Amount and/or Complexity of Data Reviewed Labs: ordered. Decision-making details documented in ED Course. Radiology: ordered and independent interpretation performed.  Risk Prescription drug management. Decision regarding hospitalization.   She presented to the ED for evaluation of persistent tachycardia.  Patient previously was on a beta-blocker.  That was discontinued a few days ago because of low blood pressures.  Consider the possibility of sepsis pulmonary embolism complications associated with her cancer.  Patient did have an elevated D-dimer but her CT  angiogram does not show evidence of PE.  Patient does have findings suggestive of possible bronchitis.  Her known lung cancer is still notable on her chest CT.  Urinalysis suggest the possibility of infection.  This could be contributing to her tachycardia but she does not have any signs of lactic acidosis or systemic infection at this time.  Patient has been treated with IV fluids as her creatinine is only slightly elevated.  No signs of severe dehydration that would account for her tachycardia.  With her comorbidities I have consulted with Dr. Imogene Burn.  We will admit her to the hospital.  It is possible some of this could be related to her discontinuation of her beta-blocker.  Will try low-dose of metoprolol.  Plan admission to hospital for observation.        Final Clinical Impression(s) / ED Diagnoses Final diagnoses:  Tachycardia  Acute cystitis without hematuria  Malignant neoplasm of lung, unspecified laterality, unspecified part of lung [facility])    Rx / DC Orders ED Discharge Orders     None         Linwood Dibbles, MD 03/05/23 2339

## 2023-03-05 NOTE — ED Notes (Signed)
Patient stated she can't urinate yet

## 2023-03-05 NOTE — Telephone Encounter (Addendum)
[facility] provided an update on the patient's recent vital signs. The patient's heart rate was measured at 126 while at rest and 141 while in motion. After speaking directly with the patient, she confirmed being in a resting position and having consumed a cold glass of water. She denied experiencing any fever or pain at the time of the conversation. I recommended that the patient seek further evaluation at the emergency room. The patient stated that she will go to the emergency room at [facility].

## 2023-03-05 NOTE — ED Triage Notes (Signed)
Pt reports palpitations and dizziness that started this morning. Denies chest pain

## 2023-03-06 ENCOUNTER — Emergency Department (HOSPITAL_COMMUNITY): Payer: No Typology Code available for payment source

## 2023-03-06 ENCOUNTER — Other Ambulatory Visit: Payer: Self-pay

## 2023-03-06 ENCOUNTER — Encounter (HOSPITAL_COMMUNITY): Payer: Self-pay

## 2023-03-06 ENCOUNTER — Encounter (HOSPITAL_COMMUNITY): Payer: Self-pay | Admitting: Internal Medicine

## 2023-03-06 ENCOUNTER — Inpatient Hospital Stay (HOSPITAL_COMMUNITY)
Admission: EM | Admit: 2023-03-06 | Discharge: 2023-03-21 | DRG: 086 | Disposition: A | Discharge status: Rehabilitation Facility | Payer: No Typology Code available for payment source | Attending: Internal Medicine | Admitting: Internal Medicine

## 2023-03-06 DIAGNOSIS — Z6833 Body mass index (BMI) 33.0-33.9, adult: Secondary | ICD-10-CM

## 2023-03-06 DIAGNOSIS — R55 Syncope and collapse: Secondary | ICD-10-CM

## 2023-03-06 DIAGNOSIS — Z86718 Personal history of other venous thrombosis and embolism: Secondary | ICD-10-CM

## 2023-03-06 DIAGNOSIS — R531 Weakness: Secondary | ICD-10-CM | POA: Diagnosis not present

## 2023-03-06 DIAGNOSIS — Z66 Do not resuscitate: Secondary | ICD-10-CM | POA: Insufficient documentation

## 2023-03-06 DIAGNOSIS — D696 Thrombocytopenia, unspecified: Secondary | ICD-10-CM | POA: Diagnosis present

## 2023-03-06 DIAGNOSIS — T451X5A Adverse effect of antineoplastic and immunosuppressive drugs, initial encounter: Secondary | ICD-10-CM | POA: Diagnosis present

## 2023-03-06 DIAGNOSIS — C3490 Malignant neoplasm of unspecified part of unspecified bronchus or lung: Secondary | ICD-10-CM

## 2023-03-06 DIAGNOSIS — I5032 Chronic diastolic (congestive) heart failure: Secondary | ICD-10-CM | POA: Diagnosis present

## 2023-03-06 DIAGNOSIS — C7951 Secondary malignant neoplasm of bone: Secondary | ICD-10-CM | POA: Diagnosis present

## 2023-03-06 DIAGNOSIS — R42 Dizziness and giddiness: Secondary | ICD-10-CM | POA: Diagnosis not present

## 2023-03-06 DIAGNOSIS — S066X0A Traumatic subarachnoid hemorrhage without loss of consciousness, initial encounter: Principal | ICD-10-CM | POA: Diagnosis present

## 2023-03-06 DIAGNOSIS — Z9049 Acquired absence of other specified parts of digestive tract: Secondary | ICD-10-CM

## 2023-03-06 DIAGNOSIS — Z885 Allergy status to narcotic agent status: Secondary | ICD-10-CM

## 2023-03-06 DIAGNOSIS — Z8619 Personal history of other infectious and parasitic diseases: Secondary | ICD-10-CM

## 2023-03-06 DIAGNOSIS — C342 Malignant neoplasm of middle lobe, bronchus or lung: Secondary | ICD-10-CM | POA: Diagnosis present

## 2023-03-06 DIAGNOSIS — Z8572 Personal history of non-Hodgkin lymphomas: Secondary | ICD-10-CM

## 2023-03-06 DIAGNOSIS — K219 Gastro-esophageal reflux disease without esophagitis: Secondary | ICD-10-CM | POA: Diagnosis present

## 2023-03-06 DIAGNOSIS — E669 Obesity, unspecified: Secondary | ICD-10-CM | POA: Diagnosis present

## 2023-03-06 DIAGNOSIS — Z90710 Acquired absence of both cervix and uterus: Secondary | ICD-10-CM

## 2023-03-06 DIAGNOSIS — J9611 Chronic respiratory failure with hypoxia: Secondary | ICD-10-CM | POA: Diagnosis present

## 2023-03-06 DIAGNOSIS — E668 Other obesity: Secondary | ICD-10-CM | POA: Diagnosis not present

## 2023-03-06 DIAGNOSIS — L89312 Pressure ulcer of right buttock, stage 2: Secondary | ICD-10-CM | POA: Diagnosis present

## 2023-03-06 DIAGNOSIS — I951 Orthostatic hypotension: Secondary | ICD-10-CM | POA: Diagnosis present

## 2023-03-06 DIAGNOSIS — N390 Urinary tract infection, site not specified: Secondary | ICD-10-CM | POA: Insufficient documentation

## 2023-03-06 DIAGNOSIS — S065X0A Traumatic subdural hemorrhage without loss of consciousness, initial encounter: Secondary | ICD-10-CM | POA: Diagnosis present

## 2023-03-06 DIAGNOSIS — R110 Nausea: Secondary | ICD-10-CM | POA: Diagnosis not present

## 2023-03-06 DIAGNOSIS — I10 Essential (primary) hypertension: Secondary | ICD-10-CM

## 2023-03-06 DIAGNOSIS — K580 Irritable bowel syndrome with diarrhea: Secondary | ICD-10-CM | POA: Diagnosis present

## 2023-03-06 DIAGNOSIS — E785 Hyperlipidemia, unspecified: Secondary | ICD-10-CM | POA: Diagnosis present

## 2023-03-06 DIAGNOSIS — Z9181 History of falling: Secondary | ICD-10-CM

## 2023-03-06 DIAGNOSIS — H8110 Benign paroxysmal vertigo, unspecified ear: Secondary | ICD-10-CM | POA: Diagnosis present

## 2023-03-06 DIAGNOSIS — R197 Diarrhea, unspecified: Secondary | ICD-10-CM

## 2023-03-06 DIAGNOSIS — D6959 Other secondary thrombocytopenia: Secondary | ICD-10-CM | POA: Diagnosis present

## 2023-03-06 DIAGNOSIS — I499 Cardiac arrhythmia, unspecified: Secondary | ICD-10-CM | POA: Diagnosis not present

## 2023-03-06 DIAGNOSIS — N1831 Chronic kidney disease, stage 3a: Secondary | ICD-10-CM | POA: Diagnosis not present

## 2023-03-06 DIAGNOSIS — R002 Palpitations: Secondary | ICD-10-CM

## 2023-03-06 DIAGNOSIS — I700 Atherosclerosis of aorta: Secondary | ICD-10-CM | POA: Diagnosis present

## 2023-03-06 DIAGNOSIS — I5022 Chronic systolic (congestive) heart failure: Secondary | ICD-10-CM | POA: Diagnosis not present

## 2023-03-06 DIAGNOSIS — I447 Left bundle-branch block, unspecified: Secondary | ICD-10-CM | POA: Diagnosis present

## 2023-03-06 DIAGNOSIS — Z7901 Long term (current) use of anticoagulants: Secondary | ICD-10-CM

## 2023-03-06 DIAGNOSIS — Z823 Family history of stroke: Secondary | ICD-10-CM

## 2023-03-06 DIAGNOSIS — Z8719 Personal history of other diseases of the digestive system: Secondary | ICD-10-CM

## 2023-03-06 DIAGNOSIS — N3000 Acute cystitis without hematuria: Secondary | ICD-10-CM | POA: Diagnosis present

## 2023-03-06 DIAGNOSIS — E7800 Pure hypercholesterolemia, unspecified: Secondary | ICD-10-CM | POA: Diagnosis present

## 2023-03-06 DIAGNOSIS — I130 Hypertensive heart and chronic kidney disease with heart failure and stage 1 through stage 4 chronic kidney disease, or unspecified chronic kidney disease: Secondary | ICD-10-CM | POA: Diagnosis present

## 2023-03-06 DIAGNOSIS — Z841 Family history of disorders of kidney and ureter: Secondary | ICD-10-CM

## 2023-03-06 DIAGNOSIS — Z79899 Other long term (current) drug therapy: Secondary | ICD-10-CM

## 2023-03-06 DIAGNOSIS — R000 Tachycardia, unspecified: Secondary | ICD-10-CM | POA: Diagnosis not present

## 2023-03-06 DIAGNOSIS — F419 Anxiety disorder, unspecified: Secondary | ICD-10-CM | POA: Diagnosis present

## 2023-03-06 DIAGNOSIS — Z8249 Family history of ischemic heart disease and other diseases of the circulatory system: Secondary | ICD-10-CM

## 2023-03-06 DIAGNOSIS — I428 Other cardiomyopathies: Secondary | ICD-10-CM | POA: Diagnosis present

## 2023-03-06 DIAGNOSIS — Z86711 Personal history of pulmonary embolism: Secondary | ICD-10-CM

## 2023-03-06 DIAGNOSIS — I493 Ventricular premature depolarization: Secondary | ICD-10-CM | POA: Diagnosis present

## 2023-03-06 DIAGNOSIS — E860 Dehydration: Secondary | ICD-10-CM | POA: Diagnosis not present

## 2023-03-06 DIAGNOSIS — E876 Hypokalemia: Secondary | ICD-10-CM | POA: Diagnosis present

## 2023-03-06 DIAGNOSIS — D638 Anemia in other chronic diseases classified elsewhere: Secondary | ICD-10-CM | POA: Diagnosis present

## 2023-03-06 DIAGNOSIS — D6481 Anemia due to antineoplastic chemotherapy: Secondary | ICD-10-CM | POA: Diagnosis present

## 2023-03-06 DIAGNOSIS — Y92009 Unspecified place in unspecified non-institutional (private) residence as the place of occurrence of the external cause: Secondary | ICD-10-CM

## 2023-03-06 DIAGNOSIS — Z853 Personal history of malignant neoplasm of breast: Secondary | ICD-10-CM

## 2023-03-06 DIAGNOSIS — W1830XA Fall on same level, unspecified, initial encounter: Secondary | ICD-10-CM | POA: Diagnosis present

## 2023-03-06 DIAGNOSIS — Z888 Allergy status to other drugs, medicaments and biological substances status: Secondary | ICD-10-CM

## 2023-03-06 HISTORY — DX: Hypomagnesemia: E83.42

## 2023-03-06 LAB — URINALYSIS, ROUTINE W REFLEX MICROSCOPIC
Bilirubin Urine: NEGATIVE
Glucose, UA: NEGATIVE mg/dL
Ketones, ur: NEGATIVE mg/dL
Nitrite: NEGATIVE
Protein, ur: NEGATIVE mg/dL
Specific Gravity, Urine: 1.01 (ref 1.005–1.030)
pH: 8 (ref 5.0–8.0)

## 2023-03-06 LAB — CBC
HCT: 28.1 % — ABNORMAL LOW (ref 36.0–46.0)
Hemoglobin: 8.6 g/dL — ABNORMAL LOW (ref 12.0–15.0)
MCH: 31.5 pg (ref 26.0–34.0)
MCHC: 30.6 g/dL (ref 30.0–36.0)
MCV: 102.9 fL — ABNORMAL HIGH (ref 80.0–100.0)
Platelets: 79 10*3/uL — ABNORMAL LOW (ref 150–400)
RBC: 2.73 MIL/uL — ABNORMAL LOW (ref 3.87–5.11)
RDW: 19.9 % — ABNORMAL HIGH (ref 11.5–15.5)
WBC: 13.6 10*3/uL — ABNORMAL HIGH (ref 4.0–10.5)
nRBC: 0.1 % (ref 0.0–0.2)

## 2023-03-06 LAB — COMPREHENSIVE METABOLIC PANEL
ALT: 11 U/L (ref 0–44)
AST: 18 U/L (ref 15–41)
Albumin: 2.2 g/dL — ABNORMAL LOW (ref 3.5–5.0)
Alkaline Phosphatase: 103 U/L (ref 38–126)
Anion gap: 8 (ref 5–15)
BUN: 9 mg/dL (ref 8–23)
CO2: 26 mmol/L (ref 22–32)
Calcium: 7.5 mg/dL — ABNORMAL LOW (ref 8.9–10.3)
Chloride: 101 mmol/L (ref 98–111)
Creatinine, Ser: 1.1 mg/dL — ABNORMAL HIGH (ref 0.44–1.00)
GFR, Estimated: 52 mL/min — ABNORMAL LOW (ref >60)
Glucose, Bld: 146 mg/dL — ABNORMAL HIGH (ref 70–99)
Potassium: 3.1 mmol/L — ABNORMAL LOW (ref 3.5–5.1)
Sodium: 135 mmol/L (ref 135–145)
Total Bilirubin: 0.4 mg/dL (ref 0.3–1.2)
Total Protein: 6.1 g/dL — ABNORMAL LOW (ref 6.5–8.1)

## 2023-03-06 LAB — CBC WITH DIFFERENTIAL/PLATELET
Abs Immature Granulocytes: 0.24 10*3/uL — ABNORMAL HIGH (ref 0.00–0.07)
Basophils Absolute: 0.1 10*3/uL (ref 0.0–0.1)
Basophils Relative: 1 %
Eosinophils Absolute: 0 10*3/uL (ref 0.0–0.5)
Eosinophils Relative: 0 %
HCT: 27.3 % — ABNORMAL LOW (ref 36.0–46.0)
Hemoglobin: 8.4 g/dL — ABNORMAL LOW (ref 12.0–15.0)
Immature Granulocytes: 2 %
Lymphocytes Relative: 8 %
Lymphs Abs: 1.1 10*3/uL (ref 0.7–4.0)
MCH: 30.9 pg (ref 26.0–34.0)
MCHC: 30.8 g/dL (ref 30.0–36.0)
MCV: 100.4 fL — ABNORMAL HIGH (ref 80.0–100.0)
Monocytes Absolute: 0.8 10*3/uL (ref 0.1–1.0)
Monocytes Relative: 6 %
Neutro Abs: 11.4 10*3/uL — ABNORMAL HIGH (ref 1.7–7.7)
Neutrophils Relative %: 83 %
Platelets: 84 10*3/uL — ABNORMAL LOW (ref 150–400)
RBC: 2.72 MIL/uL — ABNORMAL LOW (ref 3.87–5.11)
RDW: 20 % — ABNORMAL HIGH (ref 11.5–15.5)
WBC: 13.7 10*3/uL — ABNORMAL HIGH (ref 4.0–10.5)
nRBC: 0.1 % (ref 0.0–0.2)

## 2023-03-06 LAB — BASIC METABOLIC PANEL
Anion gap: 6 (ref 5–15)
BUN: 8 mg/dL (ref 8–23)
CO2: 27 mmol/L (ref 22–32)
Calcium: 7.7 mg/dL — ABNORMAL LOW (ref 8.9–10.3)
Chloride: 102 mmol/L (ref 98–111)
Creatinine, Ser: 1.15 mg/dL — ABNORMAL HIGH (ref 0.44–1.00)
GFR, Estimated: 49 mL/min — ABNORMAL LOW (ref >60)
Glucose, Bld: 99 mg/dL (ref 70–99)
Potassium: 3.6 mmol/L (ref 3.5–5.1)
Sodium: 135 mmol/L (ref 135–145)

## 2023-03-06 LAB — TROPONIN I (HIGH SENSITIVITY): Troponin I (High Sensitivity): 11 ng/L (ref <18)

## 2023-03-06 LAB — PROCALCITONIN: Procalcitonin: 0.14 ng/mL

## 2023-03-06 LAB — MAGNESIUM
Magnesium: 1.3 mg/dL — ABNORMAL LOW (ref 1.7–2.4)
Magnesium: 1.8 mg/dL (ref 1.7–2.4)

## 2023-03-06 LAB — CULTURE, BLOOD (ROUTINE X 2): Culture: NO GROWTH

## 2023-03-06 LAB — HEPARIN LEVEL (UNFRACTIONATED): Heparin Unfractionated: >1.1 IU/mL — ABNORMAL HIGH (ref 0.30–0.70)

## 2023-03-06 MED ORDER — METOPROLOL TARTRATE 5 MG/5ML IV SOLN
5.0000 mg | Freq: Once | INTRAVENOUS | Status: AC
  Administered 2023-03-06: 5 mg via INTRAVENOUS
  Filled 2023-03-06: qty 5

## 2023-03-06 MED ORDER — LORAZEPAM 0.5 MG PO TABS
0.5000 mg | ORAL_TABLET | Freq: Once | ORAL | Status: AC | PRN
  Administered 2023-03-06: 0.5 mg via ORAL
  Filled 2023-03-06: qty 1

## 2023-03-06 MED ORDER — ATENOLOL 25 MG PO TABS: 12.5000 mg | ORAL_TABLET | Freq: Two times a day (BID) | ORAL | 2 refills | Status: DC

## 2023-03-06 MED ORDER — APIXABAN 5 MG PO TABS
5.0000 mg | ORAL_TABLET | Freq: Two times a day (BID) | ORAL | Status: DC
  Administered 2023-03-06: 5 mg via ORAL
  Filled 2023-03-06: qty 1

## 2023-03-06 MED ORDER — CHLORHEXIDINE GLUCONATE CLOTH 2 % EX PADS
6.0000 | MEDICATED_PAD | Freq: Every day | CUTANEOUS | Status: DC
  Administered 2023-03-06: 6 via TOPICAL

## 2023-03-06 MED ORDER — ATENOLOL 25 MG PO TABS
12.5000 mg | ORAL_TABLET | Freq: Two times a day (BID) | ORAL | Status: DC
  Administered 2023-03-06 (×2): 12.5 mg via ORAL
  Filled 2023-03-06 (×2): qty 1

## 2023-03-06 MED ORDER — HEPARIN SOD (PORK) LOCK FLUSH 100 UNIT/ML IV SOLN
250.0000 [IU] | INTRAVENOUS | Status: AC | PRN
  Administered 2023-03-06: 250 [IU]
  Filled 2023-03-06: qty 2.5

## 2023-03-06 MED ORDER — MAGNESIUM SULFATE 4 GM/100ML IV SOLN
4.0000 g | Freq: Once | INTRAVENOUS | Status: AC
  Administered 2023-03-06: 4 g via INTRAVENOUS
  Filled 2023-03-06: qty 100

## 2023-03-06 NOTE — Progress Notes (Signed)
AVS given to patient and explained at the bedside. Medications and follow up appointments have been explained with pt verbalizing understanding.  

## 2023-03-06 NOTE — Assessment & Plan Note (Signed)
Stable

## 2023-03-06 NOTE — H&P (Addendum)
History and Physical    Crystal [name] ZOX:[id number] DOB: April 03, 1946 DOA: 03/05/2023  DOS: the patient was seen and examined on 03/05/2023  PCP: Blair Heys, MD   Patient coming from: Home  I have personally briefly reviewed patient's old medical records in [place] [facility]  CC: tachycardia HPI: 77 year old female prior history of breast cancer, non-Hodgkin's lymphoma, now with metastatic non-small cell lung cancer presents to the ER today with tachycardia.  Patient also has a history of hypertension, obesity, CKD stage IIIa, history of DVT.  Patient had her home physical therapist see her at home was noted to be tachycardic.  She was sent to the ER for evaluation.  Patient has been on Toprol-XL for nearly 20 years.  Her last dose of Toprol-XL was on the morning of 03/02/2023.  She was noted to have hypotension in the ID office and was sent to the cancer center to receive IV fluids.  She was told to stop her beta-blocker at that time.  Again her last dose was on the morning of 03/02/2023.  Arrival to the ER, temp 99 heart rate 138 blood pressure 109/74 satting 97% on room air.  Patient states that she does have home oxygen at home but does not use it all the time.  Patient had her last chemotherapy session about 2 weeks ago and a dose of Neulasta on 02/23/2023.  Sodium 135, potassium 4.0, bicarb of 27, BUN of 8, creatinine 1.1  Lactic acid was 1.4  White count 14.5, hemoglobin 8.7, platelets of 77  D-dimer elevated 2.3  CTPA was negative for PE.  There is some right lower lobe peribronchial wall thickening concerning for bronchitis.  She has a stable loculated right-sided pleural effusion.  She has a stable right middle lobe mass.  This measures 3.8 x 2.5 cm which is unchanged compared to her prior CT January 12, 2023.  Patient was given IV fluids.  [facility] contacted for admission.   ED Course: sinus tach on EKG. Lactic acid normal. WBC 14.5. HgB 8.7  Review of  Systems:  Review of Systems  Constitutional:  Positive for malaise/fatigue. Negative for chills and fever.  HENT: Negative.    Eyes: Negative.   Respiratory:  Negative for cough and shortness of breath.   Cardiovascular: Negative.  Negative for palpitations.  Gastrointestinal: Negative.   Genitourinary: Negative.   Musculoskeletal: Negative.   Skin: Negative.   Neurological: Negative.   Endo/Heme/Allergies: Negative.   Psychiatric/Behavioral: Negative.    All other systems reviewed and are negative.   Past Medical History:  Diagnosis Date   Anxiety    Atherosclerosis of aorta (HCC)    Bloodstream infection due to Port-A-Cath 01/15/2023   Breast cancer (HCC) 1998   (Rt) lumpectomy dx 1999; Dr. Truett Perna   Dyslipidemia, goal LDL below 130    Essential hypertension    GERD (gastroesophageal reflux disease)    Hypercholesterolemia    IBS (irritable bowel syndrome)    Lymphoma (HCC)    lymphoma dx 11/28/10 - left neck   MSSA bacteremia 01/15/2023   non hodgkins lymphoma 12/2010   right aprotid gland   Nonischemic cardiomyopathy (HCC) 2008   ? Doxorubincin induced; essentially resolved as of echo in January 2014, current EF 50-55%. Grade 1 diastolic dysfunction.   Obesity    Severe obesity (BMI >= 40) (HCC) 05/21/2013   Improve to BMI of 39 by July 2015   Tremor     Past Surgical History:  Procedure Laterality Date  ABDOMINAL HYSTERECTOMY     BSO   ANKLE FRACTURE SURGERY Left    BREAST EXCISIONAL BIOPSY Left    BREAST EXCISIONAL BIOPSY Right    BREAST LUMPECTOMY Right 1998   BREAST LUMPECTOMY Right 08/25/2020   Procedure: RIGHT BREAST LUMPECTOMY;  Surgeon: Abigail Miyamoto, MD;  Location: [facility];  Service: General;  Laterality: Right;   CHEST TUBE INSERTION Left 11/16/2022   Procedure: INSERTION PLEURAL DRAINAGE CATHETER;  Surgeon: Martina Sinner, MD;  Location: [facility] ENDOSCOPY;  Service: Pulmonary;  Laterality: Left;  afternoon scheduling time please    CHOLECYSTECTOMY N/A 06/09/2020   Procedure: LAPAROSCOPIC CHOLECYSTECTOMY;  Surgeon: Abigail Miyamoto, MD;  Location: [facility] ORS;  Service: General;  Laterality: N/A;   COLONOSCOPY     IR BONE TUMOR(S)RF ABLATION  11/06/2022   IR IMAGING GUIDED PORT INSERTION  11/15/2022   IR KYPHO LUMBAR INC FX REDUCE BONE BX UNI/BIL CANNULATION INC/IMAGING  11/06/2022   IR RADIOLOGIST EVAL & MGMT  10/26/2022   IR REMOVAL TUN ACCESS W/ PORT W/O FL MOD SED  01/15/2023   LEFT HEART CATH AND CORONARY ANGIOGRAPHY  12/2007   None coronary disease, EF 45% (up from nuclear study EF of 30% in June '08)   NM MYOVIEW LTD  03/29/2007   EF 33%,NEGATIVE ISCHEMIA, prob need cath   NM MYOVIEW LTD  05/01/2014   Lexiscan: EF 55%. Normal wall motion; no ischemia or infarction; apical thinning   PORTACATH PLACEMENT  08/25/2013   rt. with tip in cavoatrial junction, Dr.Yamagata    TEE WITHOUT CARDIOVERSION N/A 01/17/2023   Procedure: TRANSESOPHAGEAL ECHOCARDIOGRAM (TEE);  Surgeon: Meriam Sprague, MD;  Location: [facility] ENDOSCOPY;  Service: Cardiovascular;  Laterality: N/A;   TRANSTHORACIC ECHOCARDIOGRAM  05/01/2014   Normal LV size with low normal function. EF of 50-55% and Gr 1 DD; aortic sclerosis without stenosis. MAC and thickening/calcification of the anterior leaflet - no notable AI / AS or MR/MS     reports that she has never smoked. She has never used smokeless tobacco. She reports current alcohol use. She reports that she does not use drugs.  Allergies  Allergen Reactions   Simvastatin Other (See Comments)    Leg cramps   Zoloft [Sertraline] Nausea Only   Codeine Other (See Comments)    Bad Headaches   Coreg Other (See Comments)    "Made my legs hurt"   Lisinopril Cough   Tizanidine Hcl Rash and Other (See Comments)    Hypotension, also    Family History  Problem Relation Age of Onset   Heart Problems Mother        CABG   Chronic Renal Failure Mother    Heart Problems Father    Hypertension Sister     Cancer Sister        bladder   Migraines Sister    Heart attack Sister    Hypertension Sister        x2   Lupus Brother    Hypertension Brother        and lupus   Heart Problems Maternal Grandmother    Stroke Maternal Grandfather    Cancer Paternal Grandmother        stomach   Heart Problems Paternal Grandfather     Prior to Admission medications   Medication Sig Start Date End Date Taking? Authorizing Provider  apixaban (ELIQUIS) 5 MG TABS tablet TAKE 1 TABLET(5 MG) BY MOUTH TWICE DAILY Patient taking differently: Take 5 mg by mouth 2 (two) times daily.  02/26/23  Yes Ladene Artist, MD  acetaminophen (TYLENOL) 325 MG tablet Take 2 tablets (650 mg total) by mouth every 6 (six) hours as needed for mild pain or moderate pain. 09/14/22   Arrien, York Ram, MD  ceFAZolin (ANCEF) 10 g injection  01/30/23   [provider]  diphenhydrAMINE (BENADRYL) 25 MG tablet Take 50 mg by mouth at bedtime.    [provider]  folic acid (FOLVITE) 1 MG tablet Take 1 tablet (1 mg total) by mouth daily. 11/29/22   Rana Snare, NP  LORazepam (ATIVAN) 0.5 MG tablet Take 1 tablet (0.5 mg total) by mouth 2 (two) times daily as needed for anxiety. 02/14/23   Rana Snare, NP  magnesium oxide (MAG-OX) 400 (240 Mg) MG tablet TAKE 1 TABLET(400 MG) BY MOUTH DAILY Patient taking differently: Take 400 mg by mouth daily. TAKE 1 TABLET(400 MG) BY MOUTH DAILY 01/30/23   Ladene Artist, MD  metoprolol tartrate (LOPRESSOR) 100 MG tablet Take 1 tablet (100 mg total) by mouth 2 (two) times daily. Patient taking differently: Take 100 mg by mouth See admin instructions. Take 100 mg by mouth two times a day and hold if Systolic number is less than 100 11/17/22 12/17/22  Jerald Kief, MD  ondansetron (ZOFRAN) 8 MG tablet Take 1 tablet (8 mg total) by mouth every 8 (eight) hours as needed for nausea or vomiting (starting using day 3 after treatment as needed for nausea/vomiting). 11/27/22   Ladene Artist,  MD  oxyCODONE (OXY IR/ROXICODONE) 5 MG immediate release tablet Take 1 tablet (5 mg total) by mouth every 6 (six) hours as needed for severe pain. 02/14/23   Rana Snare, NP  pantoprazole (PROTONIX) 40 MG tablet TAKE 1 TABLET(40 MG) BY MOUTH TWICE DAILY BEFORE A MEAL 02/28/23   Ladene Artist, MD  potassium chloride (KLOR-CON M) 10 MEQ tablet TAKE 3 TABLETS(30 MEQ) BY MOUTH DAILY 02/28/23   Ladene Artist, MD  prochlorperazine (COMPAZINE) 10 MG tablet Take 1 tablet (10 mg total) by mouth every 6 (six) hours as needed for nausea or vomiting. 11/27/22   Ladene Artist, MD    Physical Exam: Vitals:   03/05/23 2230 03/05/23 2242 03/05/23 2319 03/05/23 2330  BP: (!) 128/97   113/74  Pulse: (!) 124 (!) 127  (!) 124  Resp: 20 (!) 21  (!) 24  Temp:   98 F (36.7 C)   TempSrc:      SpO2: 90% (!) 88%  100%  Weight:      Height:        Physical Exam Vitals and nursing note reviewed.  Constitutional:      General: She is not in acute distress.    Appearance: She is obese. She is not toxic-appearing or diaphoretic.  HENT:     Head: Normocephalic and atraumatic.     Nose: Nose normal.  Eyes:     General: No scleral icterus. Cardiovascular:     Rate and Rhythm: Regular rhythm. Tachycardia present.     Pulses: Normal pulses.  Pulmonary:     Effort: Pulmonary effort is normal. No respiratory distress.     Breath sounds: Normal breath sounds.  Abdominal:     General: Bowel sounds are normal. There is no distension.     Tenderness: There is no abdominal tenderness. There is no guarding or rebound.  Musculoskeletal:     Right lower leg: No edema.     Left lower leg: No  edema.  Skin:    General: Skin is warm and dry.     Capillary Refill: Capillary refill takes less than 2 seconds.  Neurological:     General: No focal deficit present.     Mental Status: She is alert and oriented to person, place, and time.      Labs on Admission: I have personally reviewed following labs and imaging  studies  CBC: Recent Labs  Lab 03/01/23 1416 03/02/23 1052 03/05/23 1813  WBC 2.7* 6.7 14.5*  NEUTROABS 1.0* 4.0 10.9*  HGB 9.1* 8.9* 8.7*  HCT 29.3* 28.5* 28.1*  MCV 97.7 97.9 99.6  PLT 42* 46* 77*   Basic Metabolic Panel: Recent Labs  Lab 03/01/23 1416 03/02/23 1151 03/05/23 1813  NA 141 140 135  K 3.4* 3.3* 4.0  CL 103 102 101  CO2 31 31 27   GLUCOSE 113* 103* 105*  BUN 13 12 8   CREATININE 0.93 0.95 1.17*  CALCIUM 7.7* 7.7* 7.7*  MG 1.3* 1.4*  --    GFR: Estimated Creatinine Clearance: 44.9 mL/min (A) (by C-G formula based on SCr of 1.17 mg/dL (H)). Liver Function Tests: Recent Labs  Lab 03/02/23 1151 03/05/23 1813  AST 15 19  ALT 7 10  ALKPHOS 101 112  BILITOT 0.4 0.4  PROT 6.2* 6.5  ALBUMIN 2.7* 2.4*   No results for input(s): "LIPASE", "AMYLASE" in the last 168 hours. No results for input(s): "AMMONIA" in the last 168 hours. Coagulation Profile: Recent Labs  Lab 03/05/23 1813  INR 1.5*   Cardiac Enzymes: Recent Labs  Lab 03/05/23 1813 03/05/23 2036  TROPONINIHS 13 12   BNP (last 3 results) Recent Labs    11/11/22 2103 01/12/23 1845  BNP 160.0* 315.2*   HbA1C: No results for input(s): "HGBA1C" in the last 72 hours. CBG: No results for input(s): "GLUCAP" in the last 168 hours. Lipid Profile: No results for input(s): "CHOL", "HDL", "LDLCALC", "TRIG", "CHOLHDL", "LDLDIRECT" in the last 72 hours. Thyroid Function Tests: No results for input(s): "TSH", "T4TOTAL", "FREET4", "T3FREE", "THYROIDAB" in the last 72 hours. Anemia Panel: No results for input(s): "VITAMINB12", "FOLATE", "FERRITIN", "TIBC", "IRON", "RETICCTPCT" in the last 72 hours. Urine analysis:    Component Value Date/Time   COLORURINE YELLOW 03/05/2023 1823   APPEARANCEUR HAZY (A) 03/05/2023 1823   LABSPEC 1.009 03/05/2023 1823   LABSPEC 1.020 07/16/2017 1540   PHURINE 8.0 03/05/2023 1823   GLUCOSEU NEGATIVE 03/05/2023 1823   GLUCOSEU Negative 07/16/2017 1540   HGBUR  SMALL (A) 03/05/2023 1823   BILIRUBINUR NEGATIVE 03/05/2023 1823   BILIRUBINUR Negative 07/16/2017 1540   KETONESUR NEGATIVE 03/05/2023 1823   PROTEINUR NEGATIVE 03/05/2023 1823   UROBILINOGEN 0.2 07/16/2017 1540   NITRITE NEGATIVE 03/05/2023 1823   LEUKOCYTESUR MODERATE (A) 03/05/2023 1823   LEUKOCYTESUR Negative 07/16/2017 1540    Radiological Exams on Admission: I have personally reviewed images CT Angio Chest PE W and/or Wo Contrast  Result Date: 03/05/2023 CLINICAL DATA:  Positive D-dimer. Chest pain. History of lung cancer. EXAM: CT ANGIOGRAPHY CHEST WITH CONTRAST TECHNIQUE: Multidetector CT imaging of the chest was performed using the standard protocol during bolus administration of intravenous contrast. Multiplanar CT image reconstructions and MIPs were obtained to evaluate the vascular anatomy. RADIATION DOSE REDUCTION: This exam was performed according to the departmental dose-optimization program which includes automated exposure control, adjustment of the mA and/or kV according to patient size and/or use of iterative reconstruction technique. CONTRAST:  75mL OMNIPAQUE IOHEXOL 350 MG/ML SOLN COMPARISON:  Chest CT 01/12/2023  FINDINGS: Cardiovascular: Satisfactory opacification of the pulmonary arteries to the segmental level. No evidence of pulmonary embolism. Normal heart size. No pericardial effusion. There are atherosclerotic calcifications of the aorta. Right chest port has been removed in the interval. There is a new left-sided central venous catheter with distal tip ending at the brachiocephalic SVC junction. Mediastinum/Nodes: Mildly enlarged subcarinal lymph node measuring 1 cm appears unchanged. Other nonenlarged, prominent mediastinal lymph nodes appear stable. The esophagus and thyroid gland are within normal limits. Lungs/Pleura: Small partially loculated right pleural effusion appears stable. Right middle lobe central mass near the hilum appears unchanged measuring proximally 3.8  x 2.5 cm. There is some new right lower lobe peribronchial wall thickening. Linear opacities in the right middle lobe, right lower lobe and lingula appear unchanged. There is some mild patchy ground-glass opacities in both lungs. Upper Abdomen: Cholecystectomy clips are present. Musculoskeletal: There is a small fluid collection in the anterior chest wall subcutaneous tissues measuring 1.2 x 1.1 cm likely related to prior chest port. There surgical clips and skin thickening in the right breast, unchanged. Right axillary surgical clips are again noted. No acute fractures are seen. Degenerative changes affect the spine. Review of the MIP images confirms the above findings. IMPRESSION: 1. No evidence for pulmonary embolism. 2. New right lower lobe peribronchial wall thickening worrisome for bronchitis. Metastatic disease not excluded. 3. Stable small partially loculated right pleural effusion. 4. Stable right middle lobe central mass. 5. Stable mediastinal lymphadenopathy. 6. New small fluid collection in the anterior chest wall likely related to prior chest port. 7. New left-sided central venous catheter with distal tip ending at the brachiocephalic SVC junction. Aortic Atherosclerosis (ICD10-I70.0). Electronically Signed   By: Darliss Cheney M.D.   On: 03/05/2023 22:49   CT Chest Wo Contrast  Result Date: 03/05/2023 CLINICAL DATA:  Abnormal x-ray EXAM: CT CHEST WITHOUT CONTRAST TECHNIQUE: Multidetector CT imaging of the chest was performed following the standard protocol without IV contrast. RADIATION DOSE REDUCTION: This exam was performed according to the departmental dose-optimization program which includes automated exposure control, adjustment of the mA and/or kV according to patient size and/or use of iterative reconstruction technique. COMPARISON:  Chest x-ray 03/05/2023, CT chest 01/12/2023, 09/13/2022 FINDINGS: Cardiovascular: Limited evaluation without intravenous contrast. Left-sided central venous  catheter tip at the cavoatrial region. Mild aortic atherosclerosis. No aneurysm. Normal cardiac size. Trace pericardial effusion Mediastinum/Nodes: Midline trachea. No thyroid mass. Subcentimeter mediastinal lymph nodes. Decreased right paratracheal node on series 3, image 20, measuring 7 mm, previously 9 mm. Esophagus within normal limits. Lungs/Pleura: Small slightly loculated right pleural effusion with interim removal of previously noted right-sided chest tube. Fluid quantity is overall decreased compared to prior chest CT. Pleural thickening is also decreased compared to prior chest CT. Vague right middle lobe lung mass measuring about 3.4 by 1.9 cm on series 3, image 30, previously 3.6 x 2.3 cm. Mild right middle lobe air bronchograms with overall slightly improved aeration of right middle lobe compared to the prior chest CT. Upper Abdomen: No acute finding. 13 mm hypodensity within the superior spleen series 3, image 42 better seen with contrast, stable with recent priors. Musculoskeletal: No acute or suspicious osseous abnormality. Old left ninth rib fracture. IMPRESSION: 1. Removal of right-sided chest tube since prior CT from March. Small slightly loculated right pleural effusion, overall decreased compared to the prior chest CT with decreased pleural thickening. Slightly improved aeration of right middle lobe compared to prior chest CT. Vague right middle lobe lung mass  appears slightly decreased in size compared to prior chest CT. 2. Decreased size of multiple mediastinal lymph nodes. 3. Aortic atherosclerosis. Aortic Atherosclerosis (ICD10-I70.0). Electronically Signed   By: Jasmine Pang M.D.   On: 03/05/2023 20:39   DG Chest Port 1 View  Result Date: 03/05/2023 CLINICAL DATA:  Questionable sepsis - evaluate for abnormality EXAM: PORTABLE CHEST 1 VIEW COMPARISON:  01/12/2023 FINDINGS: Interval removal of Port-A-Cath. Rounded opacity in the right mid to lower lung may reflect fluid within the  fissure. Versus airspace opacity. Right pleural effusion or pleural thickening noted. Left lung clear. Heart and mediastinal contours within normal limits. Left PICC line in place with the tip in the SVC. IMPRESSION: Rounded opacity in the right mid to lower lung may reflect loculated fluid within the fissure or rounded opacity/airspace disease. Right pleural effusion versus pleural thickening. Findings could be further evaluated with chest CT if felt clinically indicated. Electronically Signed   By: Charlett Nose M.D.   On: 03/05/2023 19:14    EKG: My personal interpretation of EKG shows: sinus tachycardia    Assessment/Plan Principal Problem:   Sinus tachycardia Active Problems:   Moderate obesity   Essential hypertension   Dyslipidemia, goal LDL below 100   Metastatic non-small cell lung cancer (HCC)   CKD stage 3a, GFR 45-59 ml/min (HCC) - baseline SCr 0.9-1.1   Chronic HFrEF (heart failure with reduced ejection fraction) (HCC)   History of DVT (deep vein thrombosis)   DNR (do not resuscitate)/DNI(Do Not Intubate)    Assessment and Plan: * Sinus tachycardia Observation telemetry bed. I think this is a case of betablocker withdrawal. Pt has been on toprol-xl for about 20 years. Was on 100 mg bid. That's pretty much the maximum dose.  Then it was discontinued suddenly on 03-02-2023. Pt did take her morning dose. Half-life of toprol-xl about 8-9 hours. So 5 half-lives would be about 48 hours. Pt was tachycardic today for her home physical therapy, which puts her in the right window for betablocker withdrawal. Will give her 5 mg IV lopressor and monitor her HR. If her BP remains low, may need to switch to po atenolol. I find that atenolol gives more HR reduction than BP reduction.  Chronic HFrEF (heart failure with reduced ejection fraction) (HCC) Stable.  CKD stage 3a, GFR 45-59 ml/min (HCC) - baseline SCr 0.9-1.1 Stable.  Metastatic non-small cell lung cancer (HCC) Still undergoing  chemo. Had chemo 2 weeks ago. Last neulasta injection on 02-23-2023. This could explain her mild leukocytosis. Check procalcitonin.  Dyslipidemia, goal LDL below 100 Stable.  Essential hypertension Was taking off her Toprol-xl 100 mg bid on 03-02-2023. Has been taking Toprol-xl for about 20 years. Recently BP has been low.  Moderate obesity BMI 33.4  DNR (do not resuscitate)/DNI(Do Not Intubate) Verified DNR/DNI status with patient. She will need yellow DNR form completed for her at discharge. Please take a picture of DNR form for the Media tab so it can be referenced on future admissions.  History of DVT (deep vein thrombosis) Had LE DVT in 10-2022. Remains on Eliquis 5 mg bid.   DVT prophylaxis: Eliquis Code Status: DNR/DNI(Do NOT Intubate) Family Communication: no family at bedside  Disposition Plan: return home  Consults called: none  Admission status: Observation, Telemetry bed   Carollee Herter, DO [facility] 03/06/2023, 12:28 AM

## 2023-03-06 NOTE — Subjective & Objective (Signed)
CC: tachycardia HPI: 77 year old female prior history of breast cancer, non-Hodgkin's lymphoma, now with metastatic non-small cell lung cancer presents to the ER today with tachycardia.  Patient also has a history of hypertension, obesity, CKD stage IIIa, history of DVT.  Patient had her home physical therapist see her at home was noted to be tachycardic.  She was sent to the ER for evaluation.  Patient has been on Toprol-XL for nearly 20 years.  Her last dose of Toprol-XL was on the morning of 03/02/2023.  She was noted to have hypotension in the ID office and was sent to the cancer center to receive IV fluids.  She was told to stop her beta-blocker at that time.  Again her last dose was on the morning of 03/02/2023.  Arrival to the ER, temp 99 heart rate 138 blood pressure 109/74 satting 97% on room air.  Patient states that she does have home oxygen at home but does not use it all the time.  Patient had her last chemotherapy session about 2 weeks ago and a dose of Neulasta on 02/23/2023.  Sodium 135, potassium 4.0, bicarb of 27, BUN of 8, creatinine 1.1  Lactic acid was 1.4  White count 14.5, hemoglobin 8.7, platelets of 77  D-dimer elevated 2.3  CTPA was negative for PE.  There is some right lower lobe peribronchial wall thickening concerning for bronchitis.  She has a stable loculated right-sided pleural effusion.  She has a stable right middle lobe mass.  This measures 3.8 x 2.5 cm which is unchanged compared to her prior CT January 12, 2023.  Patient was given IV fluids.  [facility] contacted for admission.

## 2023-03-06 NOTE — Assessment & Plan Note (Signed)
Had LE DVT in 10-2022. Remains on Eliquis 5 mg bid.

## 2023-03-06 NOTE — ED Triage Notes (Signed)
BIBA from home for near syncopal episode, was here yesterday got home started having diarrhea, stood up to go to restroom almost passed out and slid to floor did not hit head, on blood thinners

## 2023-03-06 NOTE — Assessment & Plan Note (Signed)
Verified DNR/DNI status with patient. She will need yellow DNR form completed for her at discharge. Please take a picture of DNR form for the Media tab so it can be referenced on future admissions.

## 2023-03-06 NOTE — ED Notes (Signed)
Patient's oxygen level decreased placed on 2 LNC

## 2023-03-06 NOTE — Assessment & Plan Note (Signed)
Still undergoing chemo. Had chemo 2 weeks ago. Last neulasta injection on 02-23-2023. This could explain her mild leukocytosis. Check procalcitonin.

## 2023-03-06 NOTE — Assessment & Plan Note (Signed)
Was taking off her Toprol-xl 100 mg bid on 03-02-2023. Has been taking Toprol-xl for about 20 years. Recently BP has been low.

## 2023-03-06 NOTE — Discharge Summary (Signed)
Physician Discharge Summary  Crystal [name] XBM:[id number] DOB: 08/07/1946 DOA: 03/05/2023  PCP: Blair Heys, MD  Admit date: 03/05/2023 Discharge date: 03/06/2023  Admitted From: Home Disposition: Home  Recommendations for Outpatient Follow-up:  Follow up with PCP in 1-2 weeks Follow-up with medical oncology, Dr. Truett Perna as scheduled Started on atenolol 12.5 mg p.o. twice daily for sinus tachycardia  Home Health: No Equipment/Devices: None  Discharge Condition: Stable CODE STATUS: Full code Diet recommendation: Regular diet  History of present illness:  Crystal [name] is a 77 year old female with past medical history significant for breast cancer, non-Hodgkin's lymphoma, metastatic non-small cell lung cancer, HTN, CKD stage IIIa, history of DVT/PE on Eliquis, obesity who presented to [facility] ED on 5/13 with complaints of palpitations and dizziness.  Patient was seen by her home physical therapist and was noted to be tachycardic; and was sent to the ED for further evaluation.  Patient denied headache, no chest pain, no fever/chills/night sweats, no nausea/vomiting/diarrhea, no abdominal pain, no focal weakness, no fatigue, no paresthesias  Patient recently was discontinued from her Toprol XL which she has been on for 20 years for concerns of hypotension in the infectious disease office on 03/02/2023.  She was initially sent to the cancer center to receive IV fluids in which she was told to stop her beta-blocker at that time.  Last dose of Toprol-XL was on 03/02/2023.  In the ED, temperature 99.0 F, HR 138, RR, BP 109/74.  WBC 14.5, hemoglobin 8.7, platelet count 77.  Sodium 135, potassium 4.0, chloride 101, CO2 27, glucose 105, BUN 8, creatinine 1.17.  Total bilirubin 0.4.  High sensitive troponin 13.  Procalcitonin 0.14.  Lactic acid 1.4.  INR 1.5.  Urinalysis with moderate leukocytes, negative nitrite, few bacteria, greater than 50 WBCs.  Chest x-ray with rounded opacity  right mid to lower lung.  D-dimer elevated 2.3.  CT angiogram chest with no evidence of pulm embolism, right lower lobe peribronchial wall thickening, stable partially occluded right pleural effusion, stable right middle lobe central mass, stable mediastinal lymphadenopathy, small fluid collection anterior chest likely related to prior chest port, new left-sided central venous catheter with distal tip ending at the brachiocephalic SVC junction.  Patient was given IV fluids.  TRH consulted for admission for further evaluation and management of sinus tachycardia.  Hospital course:  Sinus tachycardia resolved ED with dizziness, palpitations.  Noted by her home health physical therapist to be tachycardic and was sent to the ED for further evaluation.  On arrival patient's heart rate 138.  Recently discontinued metoprolol XL due to mild hypotension.  EKG on admission with sinus tachycardia.  Etiology likely secondary to acute withdrawal from beta-blockade.  Patient was started on atenolol 12.5 mg p.o. twice daily with resolution of tachycardia.  Palpitations and dizziness have also resolved.  Blood pressure has been stable during hospitalization.  Will continue atenolol 12.5 mg p.o. twice daily.  Outpatient follow-up with PCP.  Hypomagnesemia Magnesium 1.3 on admission, repleted with IV magnesium.  Continue magnesium supplementation outpatient.  Essential hypertension Started on atenolol 12.5 mg p.o. twice daily.  Previously was on metoprolol XL which was discontinued outpatient.  Blood pressure stable.  Outpatient follow-up with PCP.  CKD stage IIIa Creatinine 1.15, stable  History of DVT/PE Continue Eliquis  Metastatic non-small cell lung cancer Follows with medical oncology outpatient, Dr. Truett Perna.  Recently completed cycle 3 of carboplatin/Taxol/atezolizumab on 02/22/2023.  Continue outpatient follow-up with oncology.  Thrombocytopenia Etiology likely secondary to chemotherapy with marrow  suppression.  No reports of bleeding.  Platelet count stable, 77 at time of discharge.  On Eliquis outpatient as above.  Continue to monitor    Discharge Diagnoses:  Principal Problem:   Sinus tachycardia Active Problems:   Moderate obesity   Essential hypertension   Dyslipidemia, goal LDL below 100   Metastatic non-small cell lung cancer (HCC)   CKD stage 3a, GFR 45-59 ml/min (HCC) - baseline SCr 0.9-1.1   Chronic HFrEF (heart failure with reduced ejection fraction) (HCC)   History of DVT (deep vein thrombosis)   DNR (do not resuscitate)/DNI(Do Not Intubate)    Discharge Instructions  Discharge Instructions     Call MD for:  difficulty breathing, headache or visual disturbances   Complete by: As directed    Call MD for:  extreme fatigue   Complete by: As directed    Call MD for:  persistant dizziness or light-headedness   Complete by: As directed    Call MD for:  persistant nausea and vomiting   Complete by: As directed    Call MD for:  severe uncontrolled pain   Complete by: As directed    Call MD for:  temperature >100.4   Complete by: As directed    Diet - low sodium heart healthy   Complete by: As directed    Increase activity slowly   Complete by: As directed    No wound care   Complete by: As directed       Allergies as of 03/06/2023       Reactions   Simvastatin Other (See Comments)   Leg cramps   Zoloft [sertraline] Nausea Only   Codeine Other (See Comments)   Bad Headaches   Coreg Other (See Comments)   "Made my legs hurt"   Lisinopril Cough   Tizanidine Hcl Rash, Other (See Comments)   Hypotension, also        Medication List     STOP taking these medications    metoprolol tartrate 100 MG tablet Commonly known as: LOPRESSOR   ondansetron 8 MG tablet Commonly known as: ZOFRAN   prochlorperazine 10 MG tablet Commonly known as: COMPAZINE       TAKE these medications    acetaminophen 325 MG tablet Commonly known as: TYLENOL Take  2 tablets (650 mg total) by mouth every 6 (six) hours as needed for mild pain or moderate pain.   atenolol 25 MG tablet Commonly known as: TENORMIN Take 0.5 tablets (12.5 mg total) by mouth 2 (two) times daily.   Eliquis 5 MG Tabs tablet Generic drug: apixaban TAKE 1 TABLET(5 MG) BY MOUTH TWICE DAILY What changed: See the new instructions.   folic acid 1 MG tablet Commonly known as: FOLVITE Take 1 tablet (1 mg total) by mouth daily.   LORazepam 0.5 MG tablet Commonly known as: ATIVAN Take 1 tablet (0.5 mg total) by mouth 2 (two) times daily as needed for anxiety.   magnesium oxide 400 (240 Mg) MG tablet Commonly known as: MAG-OX TAKE 1 TABLET(400 MG) BY MOUTH DAILY What changed:  how much to take how to take this when to take this additional instructions   oxyCODONE 5 MG immediate release tablet Commonly known as: Oxy IR/ROXICODONE Take 1 tablet (5 mg total) by mouth every 6 (six) hours as needed for severe pain.   pantoprazole 40 MG tablet Commonly known as: PROTONIX TAKE 1 TABLET(40 MG) BY MOUTH TWICE DAILY BEFORE A MEAL What changed: See the new instructions.   potassium chloride  10 MEQ tablet Commonly known as: KLOR-CON M TAKE 3 TABLETS(30 MEQ) BY MOUTH DAILY What changed: See the new instructions.        Follow-up Information     Blair Heys, MD. Schedule an appointment as soon as possible for a visit in 1 week(s).   Specialty: Family Medicine Contact information: [address] [place] [address] [phone]         Ladene Artist, MD Follow up.   Specialty: Oncology Contact information: [address] [place] [address] [phone]                Allergies  Allergen Reactions   Simvastatin Other (See Comments)    Leg cramps   Zoloft [Sertraline] Nausea Only   Codeine Other (See Comments)    Bad Headaches   Coreg Other (See Comments)    "Made my legs hurt"   Lisinopril Cough   Tizanidine Hcl  Rash and Other (See Comments)    Hypotension, also    Consultations: None   Procedures/Studies: CT Angio Chest PE W and/or Wo Contrast  Result Date: 03/05/2023 CLINICAL DATA:  Positive D-dimer. Chest pain. History of lung cancer. EXAM: CT ANGIOGRAPHY CHEST WITH CONTRAST TECHNIQUE: Multidetector CT imaging of the chest was performed using the standard protocol during bolus administration of intravenous contrast. Multiplanar CT image reconstructions and MIPs were obtained to evaluate the vascular anatomy. RADIATION DOSE REDUCTION: This exam was performed according to the departmental dose-optimization program which includes automated exposure control, adjustment of the mA and/or kV according to patient size and/or use of iterative reconstruction technique. CONTRAST:  75mL OMNIPAQUE IOHEXOL 350 MG/ML SOLN COMPARISON:  Chest CT 01/12/2023 FINDINGS: Cardiovascular: Satisfactory opacification of the pulmonary arteries to the segmental level. No evidence of pulmonary embolism. Normal heart size. No pericardial effusion. There are atherosclerotic calcifications of the aorta. Right chest port has been removed in the interval. There is a new left-sided central venous catheter with distal tip ending at the brachiocephalic SVC junction. Mediastinum/Nodes: Mildly enlarged subcarinal lymph node measuring 1 cm appears unchanged. Other nonenlarged, prominent mediastinal lymph nodes appear stable. The esophagus and thyroid gland are within normal limits. Lungs/Pleura: Small partially loculated right pleural effusion appears stable. Right middle lobe central mass near the hilum appears unchanged measuring proximally 3.8 x 2.5 cm. There is some new right lower lobe peribronchial wall thickening. Linear opacities in the right middle lobe, right lower lobe and lingula appear unchanged. There is some mild patchy ground-glass opacities in both lungs. Upper Abdomen: Cholecystectomy clips are present. Musculoskeletal: There is a  small fluid collection in the anterior chest wall subcutaneous tissues measuring 1.2 x 1.1 cm likely related to prior chest port. There surgical clips and skin thickening in the right breast, unchanged. Right axillary surgical clips are again noted. No acute fractures are seen. Degenerative changes affect the spine. Review of the MIP images confirms the above findings. IMPRESSION: 1. No evidence for pulmonary embolism. 2. New right lower lobe peribronchial wall thickening worrisome for bronchitis. Metastatic disease not excluded. 3. Stable small partially loculated right pleural effusion. 4. Stable right middle lobe central mass. 5. Stable mediastinal lymphadenopathy. 6. New small fluid collection in the anterior chest wall likely related to prior chest port. 7. New left-sided central venous catheter with distal tip ending at the brachiocephalic SVC junction. Aortic Atherosclerosis (ICD10-I70.0). Electronically Signed   By: Darliss Cheney M.D.   On: 03/05/2023 22:49   CT Chest Wo Contrast  Result Date: 03/05/2023  CLINICAL DATA:  Abnormal x-ray EXAM: CT CHEST WITHOUT CONTRAST TECHNIQUE: Multidetector CT imaging of the chest was performed following the standard protocol without IV contrast. RADIATION DOSE REDUCTION: This exam was performed according to the departmental dose-optimization program which includes automated exposure control, adjustment of the mA and/or kV according to patient size and/or use of iterative reconstruction technique. COMPARISON:  Chest x-ray 03/05/2023, CT chest 01/12/2023, 09/13/2022 FINDINGS: Cardiovascular: Limited evaluation without intravenous contrast. Left-sided central venous catheter tip at the cavoatrial region. Mild aortic atherosclerosis. No aneurysm. Normal cardiac size. Trace pericardial effusion Mediastinum/Nodes: Midline trachea. No thyroid mass. Subcentimeter mediastinal lymph nodes. Decreased right paratracheal node on series 3, image 20, measuring 7 mm, previously 9 mm.  Esophagus within normal limits. Lungs/Pleura: Small slightly loculated right pleural effusion with interim removal of previously noted right-sided chest tube. Fluid quantity is overall decreased compared to prior chest CT. Pleural thickening is also decreased compared to prior chest CT. Vague right middle lobe lung mass measuring about 3.4 by 1.9 cm on series 3, image 30, previously 3.6 x 2.3 cm. Mild right middle lobe air bronchograms with overall slightly improved aeration of right middle lobe compared to the prior chest CT. Upper Abdomen: No acute finding. 13 mm hypodensity within the superior spleen series 3, image 42 better seen with contrast, stable with recent priors. Musculoskeletal: No acute or suspicious osseous abnormality. Old left ninth rib fracture. IMPRESSION: 1. Removal of right-sided chest tube since prior CT from March. Small slightly loculated right pleural effusion, overall decreased compared to the prior chest CT with decreased pleural thickening. Slightly improved aeration of right middle lobe compared to prior chest CT. Vague right middle lobe lung mass appears slightly decreased in size compared to prior chest CT. 2. Decreased size of multiple mediastinal lymph nodes. 3. Aortic atherosclerosis. Aortic Atherosclerosis (ICD10-I70.0). Electronically Signed   By: Jasmine Pang M.D.   On: 03/05/2023 20:39   DG Chest Port 1 View  Result Date: 03/05/2023 CLINICAL DATA:  Questionable sepsis - evaluate for abnormality EXAM: PORTABLE CHEST 1 VIEW COMPARISON:  01/12/2023 FINDINGS: Interval removal of Port-A-Cath. Rounded opacity in the right mid to lower lung may reflect fluid within the fissure. Versus airspace opacity. Right pleural effusion or pleural thickening noted. Left lung clear. Heart and mediastinal contours within normal limits. Left PICC line in place with the tip in the SVC. IMPRESSION: Rounded opacity in the right mid to lower lung may reflect loculated fluid within the fissure or  rounded opacity/airspace disease. Right pleural effusion versus pleural thickening. Findings could be further evaluated with chest CT if felt clinically indicated. Electronically Signed   By: Charlett Nose M.D.   On: 03/05/2023 19:14     Subjective: Patient seen examined bedside, resting comfortably.  Lying in bed.  Family present.  Palpitations and dizziness have resolved.  Heart rate now under better control.  Review of telemetry notable for heart rate between 80-90, reported 37 appears to be a spurious value in the EMR.  Received IV magnesium today.  Ready for discharge home today.  No other questions or concerns at this time.  Denies headache, no dizziness, no chest pain, no palpitations, no shortness of breath, no abdominal pain, no fever/chills/night sweats, no nausea/vomit/diarrhea, no focal weakness, no fatigue, no paresthesias.  No acute events overnight per nurse staff.  Discharge Exam: Vitals:   03/06/23 0101 03/06/23 0509  BP: 117/79 109/71  Pulse: (!) 117 (!) 37  Resp: 15 20  Temp: 98.4 F (36.9 C) 98.3  F (36.8 C)  SpO2: 91% 93%   Vitals:   03/05/23 2319 03/05/23 2330 03/06/23 0101 03/06/23 0509  BP:  113/74 117/79 109/71  Pulse:  (!) 124 (!) 117 (!) 37  Resp:  (!) 24 15 20   Temp: 98 F (36.7 C)  98.4 F (36.9 C) 98.3 F (36.8 C)  TempSrc:   Oral Oral  SpO2:  100% 91% 93%  Weight:      Height:        Physical Exam: GEN: NAD, alert and oriented x 3, obese HEENT: NCAT, PERRL, EOMI, sclera clear, MMM PULM: CTAB w/o wheezes/crackles, normal respiratory effort, on room air CV: RRR w/o M/G/R, PICC line noted left upper extremity GI: abd soft, NTND, NABS, no R/G/M MSK: no peripheral edema, muscle strength globally intact 5/5 bilateral upper/lower extremities NEURO: CN II-XII intact, no focal deficits, sensation to light touch intact PSYCH: normal mood/affect Integumentary: dry/intact, no rashes or wounds    The results of significant diagnostics from this  hospitalization (including imaging, microbiology, ancillary and laboratory) are listed below for reference.     Microbiology: Recent Results (from the past 240 hour(s))  Blood culture (routine x 2)     Status: None (Preliminary result)   Collection Time: 03/05/23  8:36 PM   Specimen: BLOOD  Result Value Ref Range Status   Specimen Description   Final    BLOOD LEFT ANTECUBITAL Performed at [facility], [address] [place], [place] [address]    Special Requests   Final    BOTTLES DRAWN AEROBIC AND ANAEROBIC Blood Culture adequate volume Performed at [facility], [address] [place], [place] [address]    Culture   Final    NO GROWTH < 12 HOURS Performed at [facility], [address], [place], [place] [address]    Report Status PENDING  Incomplete     Labs: BNP (last 3 results) Recent Labs    11/11/22 2103 01/12/23 1845  BNP 160.0* 315.2*   Basic Metabolic Panel: Recent Labs  Lab 03/01/23 1416 03/02/23 1151 03/05/23 1813 03/06/23 0431  NA 141 140 135 135  K 3.4* 3.3* 4.0 3.6  CL 103 102 101 102  CO2 31 31 27 27   GLUCOSE 113* 103* 105* 99  BUN 13 12 8 8   CREATININE 0.93 0.95 1.17* 1.15*  CALCIUM 7.7* 7.7* 7.7* 7.7*  MG 1.3* 1.4*  --  1.3*   Liver Function Tests: Recent Labs  Lab 03/02/23 1151 03/05/23 1813  AST 15 19  ALT 7 10  ALKPHOS 101 112  BILITOT 0.4 0.4  PROT 6.2* 6.5  ALBUMIN 2.7* 2.4*   No results for input(s): "LIPASE", "AMYLASE" in the last 168 hours. No results for input(s): "AMMONIA" in the last 168 hours. CBC: Recent Labs  Lab 03/01/23 1416 03/02/23 1052 03/05/23 1813 03/06/23 0431  WBC 2.7* 6.7 14.5* 13.6*  NEUTROABS 1.0* 4.0 10.9*  --   HGB 9.1* 8.9* 8.7* 8.6*  HCT 29.3* 28.5* 28.1* 28.1*  MCV 97.7 97.9 99.6 102.9*  PLT 42* 46* 77* 79*   Cardiac Enzymes: No results for input(s): "CKTOTAL", "CKMB", "CKMBINDEX", "TROPONINI" in the last 168 hours. BNP: Invalid input(s):  "POCBNP" CBG: No results for input(s): "GLUCAP" in the last 168 hours. D-Dimer Recent Labs    03/05/23 1813  DDIMER 2.32*   Hgb A1c No results for input(s): "HGBA1C" in the last 72 hours. Lipid Profile No results for input(s): "CHOL", "HDL", "LDLCALC", "TRIG", "CHOLHDL", "LDLDIRECT" in  the last 72 hours. Thyroid function studies No results for input(s): "TSH", "T4TOTAL", "T3FREE", "THYROIDAB" in the last 72 hours.  Invalid input(s): "FREET3" Anemia work up No results for input(s): "VITAMINB12", "FOLATE", "FERRITIN", "TIBC", "IRON", "RETICCTPCT" in the last 72 hours. Urinalysis    Component Value Date/Time   COLORURINE YELLOW 03/05/2023 1823   APPEARANCEUR HAZY (A) 03/05/2023 1823   LABSPEC 1.009 03/05/2023 1823   LABSPEC 1.020 07/16/2017 1540   PHURINE 8.0 03/05/2023 1823   GLUCOSEU NEGATIVE 03/05/2023 1823   GLUCOSEU Negative 07/16/2017 1540   HGBUR SMALL (A) 03/05/2023 1823   BILIRUBINUR NEGATIVE 03/05/2023 1823   BILIRUBINUR Negative 07/16/2017 1540   KETONESUR NEGATIVE 03/05/2023 1823   PROTEINUR NEGATIVE 03/05/2023 1823   UROBILINOGEN 0.2 07/16/2017 1540   NITRITE NEGATIVE 03/05/2023 1823   LEUKOCYTESUR MODERATE (A) 03/05/2023 1823   LEUKOCYTESUR Negative 07/16/2017 1540   Sepsis Labs Recent Labs  Lab 03/01/23 1416 03/02/23 1052 03/05/23 1813 03/06/23 0431  WBC 2.7* 6.7 14.5* 13.6*   Microbiology Recent Results (from the past 240 hour(s))  Blood culture (routine x 2)     Status: None (Preliminary result)   Collection Time: 03/05/23  8:36 PM   Specimen: BLOOD  Result Value Ref Range Status   Specimen Description   Final    BLOOD LEFT ANTECUBITAL Performed at [facility], [address], [place], [place] [address]    Special Requests   Final    BOTTLES DRAWN AEROBIC AND ANAEROBIC Blood Culture adequate volume Performed at [facility], [address], [place], [place] [address]    Culture   Final    NO GROWTH <  12 HOURS Performed at [facility], [address] [place], [place] [address]    Report Status PENDING  Incomplete     Time coordinating discharge: Over 30 minutes  SIGNED:   Alvira Philips [place], DO  [facility] 03/06/2023, 12:07 PM

## 2023-03-06 NOTE — ED Notes (Signed)
ED TO INPATIENT HANDOFF REPORT  Name/Age/Gender Crystal [name] 77 y.o. female  Code Status    Code Status Orders  (From admission, onward)           Start     Ordered   03/05/23 2357  Do not attempt resuscitation (DNR)  Continuous       Question Answer Comment  If patient has no pulse and is not breathing Do Not Attempt Resuscitation   If patient has a pulse and/or is breathing: Medical Treatment Goals LIMITED ADDITIONAL INTERVENTIONS: Use medication/IV fluids and cardiac monitoring as indicated; Do not use intubation or mechanical ventilation (DNI), also provide comfort medications.  Transfer to Progressive/Stepdown as indicated, avoid Intensive Care.   Consent: Discussion documented in EHR or advanced directives reviewed      03/05/23 2356           Code Status History     Date Active Date Inactive Code Status Order ID Comments User Context   01/13/2023 0324 01/19/2023 2029 DNR [id number]  Gery Pray, MD Inpatient   01/13/2023 0023 01/13/2023 0323 DNR [id number]  Gery Pray, MD ED   12/08/2022 1441 12/18/2022 1835 DNR [id number]  Maryln Gottron, MD Inpatient   12/08/2022 1441 12/08/2022 1441 Full Code [id number]  Maryln Gottron, MD Inpatient   11/12/2022 0243 11/18/2022 1621 DNR [id number]  John Giovanni, MD ED   11/06/2022 1843 11/07/2022 0508 Full Code [id number]  Roanna Banning, MD HOV   09/13/2022 2004 09/14/2022 1619 Full Code [id number]  Coralie Keens, MD Inpatient   06/09/2020 1603 06/10/2020 1538 Full Code [id number]  Princella Pellegrini Inpatient   04/22/2014 1519 04/23/2014 0330 Full Code [id number]  Richarda Overlie, MD HOV      Advance Directive Documentation    Flowsheet Row Most Recent Value  Type of Advance Directive Healthcare Power of Attorney  Pre-existing out of facility DNR order (yellow form or pink MOST form) --  "MOST" Form in Place? --       Home/SNF/Other Home  Chief Complaint Sinus tachycardia [R00.0]  Level of Care/Admitting  Diagnosis ED Disposition     ED Disposition  Admit   Condition  --   Comment  Hospital Area: [facility] [[id number]]  Level of Care: Telemetry [5]  Admit to tele based on following criteria: Complex arrhythmia (Bradycardia/Tachycardia)  May place patient in observation at [facility] or [facility] if equivalent level of care is available:: No  Covid Evaluation: Asymptomatic - no recent exposure (last 10 days) testing not required  Diagnosis: Sinus tachycardia [188416]  Admitting Physician: Imogene Burn, ERIC [[id number]]  Attending Physician: Imogene Burn, ERIC [[id number]]          Medical History Past Medical History:  Diagnosis Date   Anxiety    Atherosclerosis of aorta (HCC)    Bloodstream infection due to Port-A-Cath 01/15/2023   Breast cancer (HCC) 1998   (Rt) lumpectomy dx 1999; Dr. Truett Perna   Dyslipidemia, goal LDL below 130    Essential hypertension    GERD (gastroesophageal reflux disease)    Hypercholesterolemia    IBS (irritable bowel syndrome)    Lymphoma (HCC)    lymphoma dx 11/28/10 - left neck   MSSA bacteremia 01/15/2023   non hodgkins lymphoma 12/2010   right aprotid gland   Nonischemic cardiomyopathy (HCC) 2008   ? Doxorubincin induced; essentially resolved as of echo in January 2014, current EF 50-55%. Grade 1 diastolic dysfunction.   Obesity    Severe obesity (BMI >= 40) (  HCC) 05/21/2013   Improve to BMI of 39 by July 2015   Tremor     Allergies Allergies  Allergen Reactions   Simvastatin Other (See Comments)    Leg cramps   Zoloft [Sertraline] Nausea Only   Codeine Other (See Comments)    Bad Headaches   Coreg Other (See Comments)    "Made my legs hurt"   Lisinopril Cough   Tizanidine Hcl Rash and Other (See Comments)    Hypotension, also    IV Location/Drains/Wounds Patient Lines/Drains/Airways Status     Active Line/Drains/Airways     Name Placement date Placement time Site Days   PICC Double Lumen 01/15/23 Left Basilic 42 cm 01/15/23   1254  -- 50   Incision - 4 Ports Abdomen Left;Medial Mid;Upper Right;Upper Right;Lower 06/09/20  1259  -- 1000   Wound / Incision (Open or Dehisced) 12/08/22 Irritant Dermatitis (Moisture Associated Skin Damage) Abdomen Lower;Bilateral 12/08/22  2011  Abdomen  88            Labs/Imaging Results for orders placed or performed during the hospital encounter of 03/05/23 (from the past 48 hour(s))  Lactic acid, plasma     Status: None   Collection Time: 03/05/23  6:13 PM  Result Value Ref Range   Lactic Acid, Venous 1.4 0.5 - 1.9 mmol/L    Comment: Performed at [facility], [address] [place], [place] [address]  Comprehensive metabolic panel     Status: Abnormal   Collection Time: 03/05/23  6:13 PM  Result Value Ref Range   Sodium 135 135 - 145 mmol/L   Potassium 4.0 3.5 - 5.1 mmol/L   Chloride 101 98 - 111 mmol/L   CO2 27 22 - 32 mmol/L   Glucose, Bld 105 (H) 70 - 99 mg/dL    Comment: Glucose reference range applies only to samples taken after fasting for at least 8 hours.   BUN 8 8 - 23 mg/dL   Creatinine, Ser 2.84 (H) 0.44 - 1.00 mg/dL   Calcium 7.7 (L) 8.9 - 10.3 mg/dL   Total Protein 6.5 6.5 - 8.1 g/dL   Albumin 2.4 (L) 3.5 - 5.0 g/dL   AST 19 15 - 41 U/L   ALT 10 0 - 44 U/L   Alkaline Phosphatase 112 38 - 126 U/L   Total Bilirubin 0.4 0.3 - 1.2 mg/dL   GFR, Estimated 48 (L) >60 mL/min    Comment: (NOTE) Calculated using the CKD-EPI Creatinine Equation (2021)    Anion gap 7 5 - 15    Comment: Performed at [facility], [address] [place], [place] [address]  CBC with Differential     Status: Abnormal   Collection Time: 03/05/23  6:13 PM  Result Value Ref Range   WBC 14.5 (H) 4.0 - 10.5 K/uL   RBC 2.82 (L) 3.87 - 5.11 MIL/uL   Hemoglobin 8.7 (L) 12.0 - 15.0 g/dL   HCT 01.0 (L) 27.2 - 53.6 %   MCV 99.6 80.0 - 100.0 fL   MCH 30.9 26.0 - 34.0 pg   MCHC 31.0 30.0 - 36.0 g/dL   RDW 64.4 (H) 03.4 - 74.2 %   Platelets 77 (L)  150 - 400 K/uL    Comment: SPECIMEN CHECKED FOR CLOTS Immature Platelet Fraction may be clinically indicated, consider ordering this additional test [id number] REPEATED TO VERIFY PLATELET COUNT CONFIRMED BY SMEAR    nRBC 0.3 (H) 0.0 - 0.2 %   Neutrophils Relative %  75 %   Neutro Abs 10.9 (H) 1.7 - 7.7 K/uL   Lymphocytes Relative 13 %   Lymphs Abs 1.9 0.7 - 4.0 K/uL   Monocytes Relative 8 %   Monocytes Absolute 1.2 (H) 0.1 - 1.0 K/uL   Eosinophils Relative 0 %   Eosinophils Absolute 0.0 0.0 - 0.5 K/uL   Basophils Relative 1 %   Basophils Absolute 0.1 0.0 - 0.1 K/uL   WBC Morphology DOHLE BODIES     Comment: INCREASED BANDS (>20% BANDS) TOXIC GRANULATION VACUOLATED NEUTROPHILS    Immature Granulocytes 3 %   Abs Immature Granulocytes 0.37 (H) 0.00 - 0.07 K/uL   Polychromasia PRESENT     Comment: Performed at [facility], [address] [place], [place] [address]  Troponin I (High Sensitivity)     Status: None   Collection Time: 03/05/23  6:13 PM  Result Value Ref Range   Troponin I (High Sensitivity) 13 <18 ng/L    Comment: (NOTE) Elevated high sensitivity troponin I (hsTnI) values and significant  changes across serial measurements may suggest ACS but many other  chronic and acute conditions are known to elevate hsTnI results.  Refer to the "Links" section for chest pain algorithms and additional  guidance. Performed at [facility], [address], [place], [place] [address]   D-dimer, quantitative     Status: Abnormal   Collection Time: 03/05/23  6:13 PM  Result Value Ref Range   D-Dimer, Quant 2.32 (H) 0.00 - 0.50 ug/mL-FEU    Comment: (NOTE) At the manufacturer cut-off value of 0.5 g/mL FEU, this assay has a negative predictive value of 95-100%.This assay is intended for use in conjunction with a clinical pretest probability (PTP) assessment model to exclude pulmonary embolism (PE) and deep venous thrombosis (DVT) in  outpatients suspected of PE or DVT. Results should be correlated with clinical presentation. Performed at [facility], [address], [place], [place] [address]   APTT     Status: None   Collection Time: 03/05/23  6:13 PM  Result Value Ref Range   aPTT 35 24 - 36 seconds    Comment: Performed at [place] [facility], [address], [place], [place] [address]  Protime-INR     Status: Abnormal   Collection Time: 03/05/23  6:13 PM  Result Value Ref Range   Prothrombin Time 17.9 (H) 11.4 - 15.2 seconds   INR 1.5 (H) 0.8 - 1.2    Comment: (NOTE) INR goal varies based on device and disease states. Performed at [facility], [address] [place], [place] [address]   Urinalysis, Routine w reflex microscopic -Urine, Clean Catch     Status: Abnormal   Collection Time: 03/05/23  6:23 PM  Result Value Ref Range   Color, Urine YELLOW YELLOW   APPearance HAZY (A) CLEAR   Specific Gravity, Urine 1.009 1.005 - 1.030   pH 8.0 5.0 - 8.0   Glucose, UA NEGATIVE NEGATIVE mg/dL   Hgb urine dipstick SMALL (A) NEGATIVE   Bilirubin Urine NEGATIVE NEGATIVE   Ketones, ur NEGATIVE NEGATIVE mg/dL   Protein, ur NEGATIVE NEGATIVE mg/dL   Nitrite NEGATIVE NEGATIVE   Leukocytes,Ua MODERATE (A) NEGATIVE   RBC / HPF 6-10 0 - 5 RBC/hpf   WBC, UA >50 0 - 5 WBC/hpf   Bacteria, UA FEW (A) NONE SEEN   Squamous Epithelial / HPF 6-10 0 - 5 /HPF   Mucus PRESENT    Hyaline Casts,  UA PRESENT     Comment: Performed at [facility], [address], [place], [place] [address]  Troponin I (High Sensitivity)     Status: None   Collection Time: 03/05/23  8:36 PM  Result Value Ref Range   Troponin I (High Sensitivity) 12 <18 ng/L    Comment: (NOTE) Elevated high sensitivity troponin I (hsTnI) values and significant  changes across serial measurements may suggest ACS but many other  chronic and acute conditions are known to elevate hsTnI results.   Refer to the "Links" section for chest pain algorithms and additional  guidance. Performed at [facility], [address] [place], [place] [address]    CT Angio Chest PE W and/or Wo Contrast  Result Date: 03/05/2023 CLINICAL DATA:  Positive D-dimer. Chest pain. History of lung cancer. EXAM: CT ANGIOGRAPHY CHEST WITH CONTRAST TECHNIQUE: Multidetector CT imaging of the chest was performed using the standard protocol during bolus administration of intravenous contrast. Multiplanar CT image reconstructions and MIPs were obtained to evaluate the vascular anatomy. RADIATION DOSE REDUCTION: This exam was performed according to the departmental dose-optimization program which includes automated exposure control, adjustment of the mA and/or kV according to patient size and/or use of iterative reconstruction technique. CONTRAST:  75mL OMNIPAQUE IOHEXOL 350 MG/ML SOLN COMPARISON:  Chest CT 01/12/2023 FINDINGS: Cardiovascular: Satisfactory opacification of the pulmonary arteries to the segmental level. No evidence of pulmonary embolism. Normal heart size. No pericardial effusion. There are atherosclerotic calcifications of the aorta. Right chest port has been removed in the interval. There is a new left-sided central venous catheter with distal tip ending at the brachiocephalic SVC junction. Mediastinum/Nodes: Mildly enlarged subcarinal lymph node measuring 1 cm appears unchanged. Other nonenlarged, prominent mediastinal lymph nodes appear stable. The esophagus and thyroid gland are within normal limits. Lungs/Pleura: Small partially loculated right pleural effusion appears stable. Right middle lobe central mass near the hilum appears unchanged measuring proximally 3.8 x 2.5 cm. There is some new right lower lobe peribronchial wall thickening. Linear opacities in the right middle lobe, right lower lobe and lingula appear unchanged. There is some mild patchy ground-glass opacities in both lungs.  Upper Abdomen: Cholecystectomy clips are present. Musculoskeletal: There is a small fluid collection in the anterior chest wall subcutaneous tissues measuring 1.2 x 1.1 cm likely related to prior chest port. There surgical clips and skin thickening in the right breast, unchanged. Right axillary surgical clips are again noted. No acute fractures are seen. Degenerative changes affect the spine. Review of the MIP images confirms the above findings. IMPRESSION: 1. No evidence for pulmonary embolism. 2. New right lower lobe peribronchial wall thickening worrisome for bronchitis. Metastatic disease not excluded. 3. Stable small partially loculated right pleural effusion. 4. Stable right middle lobe central mass. 5. Stable mediastinal lymphadenopathy. 6. New small fluid collection in the anterior chest wall likely related to prior chest port. 7. New left-sided central venous catheter with distal tip ending at the brachiocephalic SVC junction. Aortic Atherosclerosis (ICD10-I70.0). Electronically Signed   By: Darliss Cheney M.D.   On: 03/05/2023 22:49   CT Chest Wo Contrast  Result Date: 03/05/2023 CLINICAL DATA:  Abnormal x-ray EXAM: CT CHEST WITHOUT CONTRAST TECHNIQUE: Multidetector CT imaging of the chest was performed following the standard protocol without IV contrast. RADIATION DOSE REDUCTION: This exam was performed according to the departmental dose-optimization program which includes automated exposure control, adjustment of the mA and/or kV according to patient size and/or use of iterative reconstruction technique. COMPARISON:  Chest x-ray 03/05/2023, CT chest 01/12/2023, 09/13/2022 FINDINGS: Cardiovascular: Limited evaluation without intravenous contrast. Left-sided central venous catheter tip at the cavoatrial region. Mild aortic atherosclerosis. No aneurysm. Normal cardiac size. Trace pericardial effusion Mediastinum/Nodes: Midline trachea. No thyroid mass. Subcentimeter mediastinal lymph nodes. Decreased  right paratracheal node on series 3, image 20, measuring 7 mm, previously 9 mm. Esophagus within normal limits. Lungs/Pleura: Small slightly loculated right pleural effusion with interim removal of previously noted right-sided chest tube. Fluid quantity is overall decreased compared to prior chest CT. Pleural thickening is also decreased compared to prior chest CT. Vague right middle lobe lung mass measuring about 3.4 by 1.9 cm on series 3, image 30, previously 3.6 x 2.3 cm. Mild right middle lobe air bronchograms with overall slightly improved aeration of right middle lobe compared to the prior chest CT. Upper Abdomen: No acute finding. 13 mm hypodensity within the superior spleen series 3, image 42 better seen with contrast, stable with recent priors. Musculoskeletal: No acute or suspicious osseous abnormality. Old left ninth rib fracture. IMPRESSION: 1. Removal of right-sided chest tube since prior CT from March. Small slightly loculated right pleural effusion, overall decreased compared to the prior chest CT with decreased pleural thickening. Slightly improved aeration of right middle lobe compared to prior chest CT. Vague right middle lobe lung mass appears slightly decreased in size compared to prior chest CT. 2. Decreased size of multiple mediastinal lymph nodes. 3. Aortic atherosclerosis. Aortic Atherosclerosis (ICD10-I70.0). Electronically Signed   By: Jasmine Pang M.D.   On: 03/05/2023 20:39   DG Chest Port 1 View  Result Date: 03/05/2023 CLINICAL DATA:  Questionable sepsis - evaluate for abnormality EXAM: PORTABLE CHEST 1 VIEW COMPARISON:  01/12/2023 FINDINGS: Interval removal of Port-A-Cath. Rounded opacity in the right mid to lower lung may reflect fluid within the fissure. Versus airspace opacity. Right pleural effusion or pleural thickening noted. Left lung clear. Heart and mediastinal contours within normal limits. Left PICC line in place with the tip in the SVC. IMPRESSION: Rounded opacity in  the right mid to lower lung may reflect loculated fluid within the fissure or rounded opacity/airspace disease. Right pleural effusion versus pleural thickening. Findings could be further evaluated with chest CT if felt clinically indicated. Electronically Signed   By: Charlett Nose M.D.   On: 03/05/2023 19:14    Pending Labs Unresulted Labs (From admission, onward)     Start     Ordered   03/06/23 0003  Procalcitonin  Add-on,   AD       References:    Procalcitonin Lower Respiratory Tract Infection AND Sepsis Procalcitonin Algorithm   03/06/23 0002   03/05/23 2224  Heparin level (unfractionated)  ONCE - URGENT,   URGENT        03/05/23 2224   03/05/23 1950  Blood culture (routine x 2)  BLOOD CULTURE X 2,   R (with STAT occurrences)      03/05/23 1950            Vitals/Pain Today's Vitals   03/05/23 2230 03/05/23 2242 03/05/23 2319 03/05/23 2330  BP: (!) 128/97   113/74  Pulse: (!) 124 (!) 127  (!) 124  Resp: 20 (!) 21  (!) 24  Temp:   98 F (36.7 C)   TempSrc:      SpO2: 90% (!) 88%  100%  Weight:      Height:      PainSc:        Isolation Precautions No active isolations  Medications Medications  lactated ringers infusion ( Intravenous New Bag/Given 03/05/23 1844)  lactated ringers bolus 1,000 mL (1,000 mLs Intravenous New Bag/Given 03/06/23 0006)  lactated ringers bolus 1,000 mL (0 mLs Intravenous Stopped 03/05/23 2037)  cefTRIAXone (ROCEPHIN) 1 g in sodium chloride 0.9 % 100 mL IVPB (0 g Intravenous Stopped 03/05/23 2143)  azithromycin (ZITHROMAX) tablet 500 mg (500 mg Oral Given 03/05/23 2035)  iohexol (OMNIPAQUE) 350 MG/ML injection 75 mL (75 mLs Intravenous Contrast Given 03/05/23 2220)  metoprolol tartrate (LOPRESSOR) injection 5 mg (5 mg Intravenous Given 03/06/23 0005)    Mobility walks with person assist

## 2023-03-06 NOTE — Assessment & Plan Note (Signed)
Observation telemetry bed. I think this is a case of betablocker withdrawal. Pt has been on toprol-xl for about 20 years. Was on 100 mg bid. That's pretty much the maximum dose.  Then it was discontinued suddenly on 03-02-2023. Pt did take her morning dose. Half-life of toprol-xl about 8-9 hours. So 5 half-lives would be about 48 hours. Pt was tachycardic today for her home physical therapy, which puts her in the right window for betablocker withdrawal. Will give her 5 mg IV lopressor and monitor her HR. If her BP remains low, may need to switch to po atenolol. I find that atenolol gives more HR reduction than BP reduction.

## 2023-03-06 NOTE — Assessment & Plan Note (Signed)
BMI 33.4

## 2023-03-06 NOTE — ED Provider Notes (Signed)
[place] EMERGENCY DEPARTMENT AT [facility] Provider Note   CSN: [id number] Arrival date & time: 03/06/23  2129     History  Chief Complaint  Patient presents with   Marletta Lor    Crystal [name] is a 77 y.o. female.  With past medical history of obesity, lymphoma, hypertension, nonischemic cardiomyopathy who presents to the emergency department with fall.  States earlier this evening she was bending over to get some material to clean up her carpet when she felt like she became dizzy and weak and lightheaded.  She states that she was able to slide herself down to the floor.  She did not lose consciousness or strike her head.  She states that since then she has continued to feel dizzy particularly when standing up.  She also endorses feeling lightheaded and presyncopal.  She states that she has been eating okay but probably drinking less than she should be.  She otherwise is denying any nausea, vomiting, fever, chest pain, palpitations.  She has baseline shortness of breath from her known lung cancer.  She is on chemotherapy and last received treatment last Thursday.  She was just discharged today after being admitted for palpitations.    On chart review, appears that yesterday evening she presented to the emergency department with palpitations and dizziness.  She was at physical therapy prior to this and they noticed her heart rate was elevated.  It was revealed that she had a beta-blocker that had been discontinued a few days prior to her presentation.  She had a CT angio which did not show any evidence of PE.  Chest x-ray did show findings of possible bronchitis.  She was given IV fluids.  She was admitted and started on low-dose metoprolol for possible beta-blocker withdrawal.  Fall Associated symptoms include shortness of breath.       Home Medications Prior to Admission medications   Medication Sig Start Date End Date Taking? Authorizing Provider  acetaminophen (TYLENOL)  325 MG tablet Take 2 tablets (650 mg total) by mouth every 6 (six) hours as needed for mild pain or moderate pain. 09/14/22   Arrien, York Ram, MD  apixaban (ELIQUIS) 5 MG TABS tablet TAKE 1 TABLET(5 MG) BY MOUTH TWICE DAILY Patient taking differently: Take 5 mg by mouth 2 (two) times daily. 02/26/23   Ladene Artist, MD  atenolol (TENORMIN) 25 MG tablet Take 0.5 tablets (12.5 mg total) by mouth 2 (two) times daily. 03/06/23 06/04/23  [place], Alvira Philips, DO  folic acid (FOLVITE) 1 MG tablet Take 1 tablet (1 mg total) by mouth daily. 11/29/22   Rana Snare, NP  LORazepam (ATIVAN) 0.5 MG tablet Take 1 tablet (0.5 mg total) by mouth 2 (two) times daily as needed for anxiety. 02/14/23   Rana Snare, NP  magnesium oxide (MAG-OX) 400 (240 Mg) MG tablet TAKE 1 TABLET(400 MG) BY MOUTH DAILY Patient taking differently: Take 400 mg by mouth 2 (two) times daily. 01/30/23   Ladene Artist, MD  oxyCODONE (OXY IR/ROXICODONE) 5 MG immediate release tablet Take 1 tablet (5 mg total) by mouth every 6 (six) hours as needed for severe pain. 02/14/23   Rana Snare, NP  pantoprazole (PROTONIX) 40 MG tablet TAKE 1 TABLET(40 MG) BY MOUTH TWICE DAILY BEFORE A MEAL Patient taking differently: Take 40 mg by mouth 2 (two) times daily. 02/28/23   Ladene Artist, MD  potassium chloride (KLOR-CON M) 10 MEQ tablet TAKE 3 TABLETS(30 MEQ) BY MOUTH DAILY  Patient taking differently: Take 10 mEq by mouth in the morning, at noon, in the evening, and at bedtime. 02/28/23   Ladene Artist, MD      Allergies    Simvastatin, Zoloft [sertraline], Codeine, Coreg, Lisinopril, and Tizanidine hcl    Review of Systems   Review of Systems  Respiratory:  Positive for shortness of breath.   Neurological:  Positive for dizziness and light-headedness.  All other systems reviewed and are negative.   Physical Exam Updated Vital Signs BP 135/74 (BP Location: Right Arm)   Pulse (!) 109   Temp 98.7 F (37.1 C) (Oral)   Resp (!) 23    Ht 5\' 5"  (1.651 m)   Wt 91.2 kg   SpO2 (!) 87%   BMI 33.46 kg/m  Physical Exam Vitals and nursing note reviewed.  Constitutional:      General: She is not in acute distress.    Appearance: She is ill-appearing.  HENT:     Head: Normocephalic.     Mouth/Throat:     Mouth: Mucous membranes are dry.     Pharynx: Oropharynx is clear.  Eyes:     General: No scleral icterus.    Extraocular Movements: Extraocular movements intact.  Cardiovascular:     Rate and Rhythm: Regular rhythm. Tachycardia present.     Pulses:          Radial pulses are 1+ on the right side and 1+ on the left side.       Dorsalis pedis pulses are 1+ on the right side and 1+ on the left side.     Heart sounds: No murmur heard. Pulmonary:     Effort: Tachypnea present. No respiratory distress.     Breath sounds: Examination of the right-middle field reveals decreased breath sounds. Examination of the right-lower field reveals decreased breath sounds. Decreased breath sounds present. No wheezing.  Abdominal:     General: Bowel sounds are normal. There is no distension.     Palpations: Abdomen is soft.     Tenderness: There is no abdominal tenderness.  Musculoskeletal:     Cervical back: Neck supple.     Right lower leg: 1+ Pitting Edema present.     Left lower leg: 1+ Pitting Edema present.  Skin:    General: Skin is warm and dry.     Capillary Refill: Capillary refill takes less than 2 seconds.  Neurological:     General: No focal deficit present.     Mental Status: She is alert and oriented to person, place, and time.  Psychiatric:        Mood and Affect: Mood normal.        Behavior: Behavior normal.     ED Results / Procedures / Treatments   Labs (all labs ordered are listed, but only abnormal results are displayed) Labs Reviewed  URINALYSIS, ROUTINE W REFLEX MICROSCOPIC - Abnormal; Notable for the following components:      Result Value   APPearance HAZY (*)    Hgb urine dipstick MODERATE (*)     Leukocytes,Ua SMALL (*)    Bacteria, UA MANY (*)    All other components within normal limits  COMPREHENSIVE METABOLIC PANEL  CBC WITH DIFFERENTIAL/PLATELET  MAGNESIUM  BRAIN NATRIURETIC PEPTIDE  TROPONIN I (HIGH SENSITIVITY)    EKG None  Radiology DG Chest Port 1 View  Result Date: 03/06/2023 CLINICAL DATA:  Syncope. EXAM: PORTABLE CHEST 1 VIEW COMPARISON:  Mar 05, 2023 FINDINGS: The heart size and mediastinal  contours are within normal limits. There is moderate severity calcification of the aortic arch. Low lung volumes are noted. Stable mild to moderate severity right middle lobe and right lower lobe Karim, atelectasis and/or infiltrate is seen. No pleural effusion or pneumothorax is identified. Radiopaque surgical clips are seen along the lateral aspect of the right chest wall. The visualized skeletal structures are unremarkable. IMPRESSION: Low lung volumes with stable mild to moderate severity right middle lobe and right lower lobe atelectasis and/or infiltrate. Electronically Signed   By: Aram Candela M.D.   On: 03/06/2023 22:38   CT Angio Chest PE W and/or Wo Contrast  Result Date: 03/05/2023 CLINICAL DATA:  Positive D-dimer. Chest pain. History of lung cancer. EXAM: CT ANGIOGRAPHY CHEST WITH CONTRAST TECHNIQUE: Multidetector CT imaging of the chest was performed using the standard protocol during bolus administration of intravenous contrast. Multiplanar CT image reconstructions and MIPs were obtained to evaluate the vascular anatomy. RADIATION DOSE REDUCTION: This exam was performed according to the departmental dose-optimization program which includes automated exposure control, adjustment of the mA and/or kV according to patient size and/or use of iterative reconstruction technique. CONTRAST:  75mL OMNIPAQUE IOHEXOL 350 MG/ML SOLN COMPARISON:  Chest CT 01/12/2023 FINDINGS: Cardiovascular: Satisfactory opacification of the pulmonary arteries to the segmental level. No evidence  of pulmonary embolism. Normal heart size. No pericardial effusion. There are atherosclerotic calcifications of the aorta. Right chest port has been removed in the interval. There is a new left-sided central venous catheter with distal tip ending at the brachiocephalic SVC junction. Mediastinum/Nodes: Mildly enlarged subcarinal lymph node measuring 1 cm appears unchanged. Other nonenlarged, prominent mediastinal lymph nodes appear stable. The esophagus and thyroid gland are within normal limits. Lungs/Pleura: Small partially loculated right pleural effusion appears stable. Right middle lobe central mass near the hilum appears unchanged measuring proximally 3.8 x 2.5 cm. There is some new right lower lobe peribronchial wall thickening. Linear opacities in the right middle lobe, right lower lobe and lingula appear unchanged. There is some mild patchy ground-glass opacities in both lungs. Upper Abdomen: Cholecystectomy clips are present. Musculoskeletal: There is a small fluid collection in the anterior chest wall subcutaneous tissues measuring 1.2 x 1.1 cm likely related to prior chest port. There surgical clips and skin thickening in the right breast, unchanged. Right axillary surgical clips are again noted. No acute fractures are seen. Degenerative changes affect the spine. Review of the MIP images confirms the above findings. IMPRESSION: 1. No evidence for pulmonary embolism. 2. New right lower lobe peribronchial wall thickening worrisome for bronchitis. Metastatic disease not excluded. 3. Stable small partially loculated right pleural effusion. 4. Stable right middle lobe central mass. 5. Stable mediastinal lymphadenopathy. 6. New small fluid collection in the anterior chest wall likely related to prior chest port. 7. New left-sided central venous catheter with distal tip ending at the brachiocephalic SVC junction. Aortic Atherosclerosis (ICD10-I70.0). Electronically Signed   By: Darliss Cheney M.D.   On: 03/05/2023  22:49   CT Chest Wo Contrast  Result Date: 03/05/2023 CLINICAL DATA:  Abnormal x-ray EXAM: CT CHEST WITHOUT CONTRAST TECHNIQUE: Multidetector CT imaging of the chest was performed following the standard protocol without IV contrast. RADIATION DOSE REDUCTION: This exam was performed according to the departmental dose-optimization program which includes automated exposure control, adjustment of the mA and/or kV according to patient size and/or use of iterative reconstruction technique. COMPARISON:  Chest x-ray 03/05/2023, CT chest 01/12/2023, 09/13/2022 FINDINGS: Cardiovascular: Limited evaluation without intravenous contrast. Left-sided central venous catheter  tip at the cavoatrial region. Mild aortic atherosclerosis. No aneurysm. Normal cardiac size. Trace pericardial effusion Mediastinum/Nodes: Midline trachea. No thyroid mass. Subcentimeter mediastinal lymph nodes. Decreased right paratracheal node on series 3, image 20, measuring 7 mm, previously 9 mm. Esophagus within normal limits. Lungs/Pleura: Small slightly loculated right pleural effusion with interim removal of previously noted right-sided chest tube. Fluid quantity is overall decreased compared to prior chest CT. Pleural thickening is also decreased compared to prior chest CT. Vague right middle lobe lung mass measuring about 3.4 by 1.9 cm on series 3, image 30, previously 3.6 x 2.3 cm. Mild right middle lobe air bronchograms with overall slightly improved aeration of right middle lobe compared to the prior chest CT. Upper Abdomen: No acute finding. 13 mm hypodensity within the superior spleen series 3, image 42 better seen with contrast, stable with recent priors. Musculoskeletal: No acute or suspicious osseous abnormality. Old left ninth rib fracture. IMPRESSION: 1. Removal of right-sided chest tube since prior CT from March. Small slightly loculated right pleural effusion, overall decreased compared to the prior chest CT with decreased pleural  thickening. Slightly improved aeration of right middle lobe compared to prior chest CT. Vague right middle lobe lung mass appears slightly decreased in size compared to prior chest CT. 2. Decreased size of multiple mediastinal lymph nodes. 3. Aortic atherosclerosis. Aortic Atherosclerosis (ICD10-I70.0). Electronically Signed   By: Jasmine Pang M.D.   On: 03/05/2023 20:39   DG Chest Port 1 View  Result Date: 03/05/2023 CLINICAL DATA:  Questionable sepsis - evaluate for abnormality EXAM: PORTABLE CHEST 1 VIEW COMPARISON:  01/12/2023 FINDINGS: Interval removal of Port-A-Cath. Rounded opacity in the right mid to lower lung may reflect fluid within the fissure. Versus airspace opacity. Right pleural effusion or pleural thickening noted. Left lung clear. Heart and mediastinal contours within normal limits. Left PICC line in place with the tip in the SVC. IMPRESSION: Rounded opacity in the right mid to lower lung may reflect loculated fluid within the fissure or rounded opacity/airspace disease. Right pleural effusion versus pleural thickening. Findings could be further evaluated with chest CT if felt clinically indicated. Electronically Signed   By: Charlett Nose M.D.   On: 03/05/2023 19:14    Procedures Procedures   Medications Ordered in ED Medications - No data to display  ED Course/ Medical Decision Making/ A&P   {   Click here for ABCD2, HEART and other calculatorsREFRESH Note before signing :1}                          Medical Decision Making Amount and/or Complexity of Data Reviewed Labs: ordered. Radiology: ordered.  Initial Impression and Ddx 77 year old female who presents to the emergency department with fall Patient PMH that increases complexity of ED encounter:  obesity, NICM, GERD, lymphoma, hypertension  Interpretation of Diagnostics I independent reviewed and interpreted the labs as followed: ***  - I independently visualized the following imaging with scope of interpretation  limited to determining acute life threatening conditions related to emergency care: ***, which revealed ***  Patient Reassessment and Ultimate Disposition/Management ***  Patient management required discussion with the following services or consulting groups:  {BEROCONSULT:26841}  Complexity of Problems Addressed {BEROCOPA:26833}  Additional Data Reviewed and Analyzed Further history obtained from: {BERODATA:26834}  Patient Encounter Risk Assessment {BERORISK:26838}  Final Clinical Impression(s) / ED Diagnoses Final diagnoses:  None    Rx / DC Orders ED Discharge Orders     None

## 2023-03-07 ENCOUNTER — Observation Stay (HOSPITAL_COMMUNITY): Payer: No Typology Code available for payment source

## 2023-03-07 ENCOUNTER — Other Ambulatory Visit: Payer: Self-pay | Admitting: Oncology

## 2023-03-07 DIAGNOSIS — I700 Atherosclerosis of aorta: Secondary | ICD-10-CM | POA: Diagnosis not present

## 2023-03-07 DIAGNOSIS — D638 Anemia in other chronic diseases classified elsewhere: Secondary | ICD-10-CM | POA: Diagnosis not present

## 2023-03-07 DIAGNOSIS — R531 Weakness: Secondary | ICD-10-CM | POA: Diagnosis not present

## 2023-03-07 DIAGNOSIS — J910 Malignant pleural effusion: Secondary | ICD-10-CM | POA: Diagnosis not present

## 2023-03-07 DIAGNOSIS — I493 Ventricular premature depolarization: Secondary | ICD-10-CM | POA: Diagnosis present

## 2023-03-07 DIAGNOSIS — E669 Obesity, unspecified: Secondary | ICD-10-CM | POA: Diagnosis not present

## 2023-03-07 DIAGNOSIS — Z86711 Personal history of pulmonary embolism: Secondary | ICD-10-CM | POA: Diagnosis not present

## 2023-03-07 DIAGNOSIS — R339 Retention of urine, unspecified: Secondary | ICD-10-CM | POA: Diagnosis not present

## 2023-03-07 DIAGNOSIS — I629 Nontraumatic intracranial hemorrhage, unspecified: Secondary | ICD-10-CM | POA: Diagnosis not present

## 2023-03-07 DIAGNOSIS — Z853 Personal history of malignant neoplasm of breast: Secondary | ICD-10-CM | POA: Diagnosis not present

## 2023-03-07 DIAGNOSIS — D61818 Other pancytopenia: Secondary | ICD-10-CM | POA: Diagnosis not present

## 2023-03-07 DIAGNOSIS — D6959 Other secondary thrombocytopenia: Secondary | ICD-10-CM | POA: Diagnosis not present

## 2023-03-07 DIAGNOSIS — R5381 Other malaise: Secondary | ICD-10-CM | POA: Diagnosis not present

## 2023-03-07 DIAGNOSIS — C3490 Malignant neoplasm of unspecified part of unspecified bronchus or lung: Secondary | ICD-10-CM

## 2023-03-07 DIAGNOSIS — C342 Malignant neoplasm of middle lobe, bronchus or lung: Secondary | ICD-10-CM

## 2023-03-07 DIAGNOSIS — J9601 Acute respiratory failure with hypoxia: Secondary | ICD-10-CM | POA: Diagnosis not present

## 2023-03-07 DIAGNOSIS — Z8572 Personal history of non-Hodgkin lymphomas: Secondary | ICD-10-CM | POA: Diagnosis not present

## 2023-03-07 DIAGNOSIS — C7951 Secondary malignant neoplasm of bone: Secondary | ICD-10-CM | POA: Diagnosis not present

## 2023-03-07 DIAGNOSIS — R42 Dizziness and giddiness: Secondary | ICD-10-CM

## 2023-03-07 DIAGNOSIS — Z66 Do not resuscitate: Secondary | ICD-10-CM | POA: Diagnosis not present

## 2023-03-07 DIAGNOSIS — Y92009 Unspecified place in unspecified non-institutional (private) residence as the place of occurrence of the external cause: Secondary | ICD-10-CM | POA: Diagnosis not present

## 2023-03-07 DIAGNOSIS — S066X0A Traumatic subarachnoid hemorrhage without loss of consciousness, initial encounter: Secondary | ICD-10-CM | POA: Diagnosis not present

## 2023-03-07 DIAGNOSIS — J9611 Chronic respiratory failure with hypoxia: Secondary | ICD-10-CM | POA: Diagnosis not present

## 2023-03-07 DIAGNOSIS — I130 Hypertensive heart and chronic kidney disease with heart failure and stage 1 through stage 4 chronic kidney disease, or unspecified chronic kidney disease: Secondary | ICD-10-CM | POA: Diagnosis not present

## 2023-03-07 DIAGNOSIS — W1830XA Fall on same level, unspecified, initial encounter: Secondary | ICD-10-CM | POA: Diagnosis present

## 2023-03-07 DIAGNOSIS — R55 Syncope and collapse: Secondary | ICD-10-CM

## 2023-03-07 DIAGNOSIS — E7800 Pure hypercholesterolemia, unspecified: Secondary | ICD-10-CM | POA: Diagnosis not present

## 2023-03-07 DIAGNOSIS — J9811 Atelectasis: Secondary | ICD-10-CM | POA: Diagnosis not present

## 2023-03-07 DIAGNOSIS — D630 Anemia in neoplastic disease: Secondary | ICD-10-CM | POA: Diagnosis not present

## 2023-03-07 DIAGNOSIS — F419 Anxiety disorder, unspecified: Secondary | ICD-10-CM | POA: Diagnosis not present

## 2023-03-07 DIAGNOSIS — D6481 Anemia due to antineoplastic chemotherapy: Secondary | ICD-10-CM | POA: Diagnosis present

## 2023-03-07 DIAGNOSIS — L89312 Pressure ulcer of right buttock, stage 2: Secondary | ICD-10-CM | POA: Diagnosis not present

## 2023-03-07 DIAGNOSIS — N390 Urinary tract infection, site not specified: Secondary | ICD-10-CM | POA: Insufficient documentation

## 2023-03-07 DIAGNOSIS — S065X0A Traumatic subdural hemorrhage without loss of consciousness, initial encounter: Secondary | ICD-10-CM | POA: Diagnosis not present

## 2023-03-07 DIAGNOSIS — I951 Orthostatic hypotension: Secondary | ICD-10-CM | POA: Diagnosis not present

## 2023-03-07 DIAGNOSIS — D649 Anemia, unspecified: Secondary | ICD-10-CM | POA: Diagnosis not present

## 2023-03-07 DIAGNOSIS — R197 Diarrhea, unspecified: Secondary | ICD-10-CM | POA: Diagnosis not present

## 2023-03-07 DIAGNOSIS — S066XAD Traumatic subarachnoid hemorrhage with loss of consciousness status unknown, subsequent encounter: Secondary | ICD-10-CM | POA: Diagnosis not present

## 2023-03-07 DIAGNOSIS — S069XAD Unspecified intracranial injury with loss of consciousness status unknown, subsequent encounter: Secondary | ICD-10-CM | POA: Diagnosis not present

## 2023-03-07 DIAGNOSIS — D696 Thrombocytopenia, unspecified: Secondary | ICD-10-CM | POA: Diagnosis not present

## 2023-03-07 DIAGNOSIS — Z86718 Personal history of other venous thrombosis and embolism: Secondary | ICD-10-CM | POA: Diagnosis not present

## 2023-03-07 DIAGNOSIS — H8110 Benign paroxysmal vertigo, unspecified ear: Secondary | ICD-10-CM | POA: Diagnosis not present

## 2023-03-07 DIAGNOSIS — R195 Other fecal abnormalities: Secondary | ICD-10-CM | POA: Diagnosis not present

## 2023-03-07 DIAGNOSIS — R000 Tachycardia, unspecified: Secondary | ICD-10-CM | POA: Diagnosis not present

## 2023-03-07 DIAGNOSIS — N3000 Acute cystitis without hematuria: Secondary | ICD-10-CM | POA: Diagnosis not present

## 2023-03-07 DIAGNOSIS — T451X5A Adverse effect of antineoplastic and immunosuppressive drugs, initial encounter: Secondary | ICD-10-CM | POA: Diagnosis not present

## 2023-03-07 DIAGNOSIS — Z832 Family history of diseases of the blood and blood-forming organs and certain disorders involving the immune mechanism: Secondary | ICD-10-CM | POA: Diagnosis not present

## 2023-03-07 DIAGNOSIS — N1831 Chronic kidney disease, stage 3a: Secondary | ICD-10-CM | POA: Diagnosis not present

## 2023-03-07 DIAGNOSIS — F411 Generalized anxiety disorder: Secondary | ICD-10-CM | POA: Diagnosis not present

## 2023-03-07 DIAGNOSIS — I5022 Chronic systolic (congestive) heart failure: Secondary | ICD-10-CM | POA: Diagnosis not present

## 2023-03-07 DIAGNOSIS — I428 Other cardiomyopathies: Secondary | ICD-10-CM | POA: Diagnosis not present

## 2023-03-07 DIAGNOSIS — L299 Pruritus, unspecified: Secondary | ICD-10-CM | POA: Diagnosis not present

## 2023-03-07 DIAGNOSIS — E876 Hypokalemia: Secondary | ICD-10-CM | POA: Diagnosis not present

## 2023-03-07 LAB — COMPREHENSIVE METABOLIC PANEL
ALT: 9 U/L (ref 0–44)
AST: 16 U/L (ref 15–41)
Albumin: 2.2 g/dL — ABNORMAL LOW (ref 3.5–5.0)
Alkaline Phosphatase: 92 U/L (ref 38–126)
Anion gap: 8 (ref 5–15)
BUN: 9 mg/dL (ref 8–23)
CO2: 26 mmol/L (ref 22–32)
Calcium: 7.3 mg/dL — ABNORMAL LOW (ref 8.9–10.3)
Chloride: 100 mmol/L (ref 98–111)
Creatinine, Ser: 1.06 mg/dL — ABNORMAL HIGH (ref 0.44–1.00)
GFR, Estimated: 54 mL/min — ABNORMAL LOW (ref >60)
Glucose, Bld: 115 mg/dL — ABNORMAL HIGH (ref 70–99)
Potassium: 3.1 mmol/L — ABNORMAL LOW (ref 3.5–5.1)
Sodium: 134 mmol/L — ABNORMAL LOW (ref 135–145)
Total Bilirubin: 0.5 mg/dL (ref 0.3–1.2)
Total Protein: 5.6 g/dL — ABNORMAL LOW (ref 6.5–8.1)

## 2023-03-07 LAB — CULTURE, BLOOD (ROUTINE X 2): Culture: NO GROWTH

## 2023-03-07 LAB — BRAIN NATRIURETIC PEPTIDE: B Natriuretic Peptide: 405 pg/mL — ABNORMAL HIGH (ref 0.0–100.0)

## 2023-03-07 LAB — CBC
HCT: 25.4 % — ABNORMAL LOW (ref 36.0–46.0)
Hemoglobin: 7.8 g/dL — ABNORMAL LOW (ref 12.0–15.0)
MCH: 30.8 pg (ref 26.0–34.0)
MCHC: 30.7 g/dL (ref 30.0–36.0)
MCV: 100.4 fL — ABNORMAL HIGH (ref 80.0–100.0)
Platelets: 87 10*3/uL — ABNORMAL LOW (ref 150–400)
RBC: 2.53 MIL/uL — ABNORMAL LOW (ref 3.87–5.11)
RDW: 19.9 % — ABNORMAL HIGH (ref 11.5–15.5)
WBC: 13 10*3/uL — ABNORMAL HIGH (ref 4.0–10.5)
nRBC: 0 % (ref 0.0–0.2)

## 2023-03-07 LAB — TROPONIN I (HIGH SENSITIVITY): Troponin I (High Sensitivity): 13 ng/L (ref <18)

## 2023-03-07 LAB — TSH: TSH: 1.785 u[IU]/mL (ref 0.350–4.500)

## 2023-03-07 MED ORDER — APIXABAN 5 MG PO TABS
5.0000 mg | ORAL_TABLET | Freq: Two times a day (BID) | ORAL | Status: DC
  Administered 2023-03-07 (×2): 5 mg via ORAL
  Filled 2023-03-07 (×2): qty 1

## 2023-03-07 MED ORDER — MAGNESIUM OXIDE -MG SUPPLEMENT 400 (240 MG) MG PO TABS
400.0000 mg | ORAL_TABLET | Freq: Two times a day (BID) | ORAL | Status: DC
  Administered 2023-03-07 – 2023-03-21 (×30): 400 mg via ORAL
  Filled 2023-03-07 (×30): qty 1

## 2023-03-07 MED ORDER — MECLIZINE HCL 25 MG PO TABS
12.5000 mg | ORAL_TABLET | Freq: Three times a day (TID) | ORAL | Status: DC | PRN
  Administered 2023-03-08 – 2023-03-09 (×2): 12.5 mg via ORAL
  Filled 2023-03-07 (×2): qty 1

## 2023-03-07 MED ORDER — ONDANSETRON HCL 4 MG PO TABS: 4.0000 mg | ORAL_TABLET | Freq: Four times a day (QID) | ORAL | Status: DC | PRN

## 2023-03-07 MED ORDER — POLYETHYLENE GLYCOL 3350 17 G PO PACK: 17.0000 g | PACK | Freq: Every day | ORAL | Status: DC | PRN

## 2023-03-07 MED ORDER — FOLIC ACID 1 MG PO TABS
1.0000 mg | ORAL_TABLET | Freq: Every day | ORAL | Status: DC
  Administered 2023-03-07 – 2023-03-21 (×15): 1 mg via ORAL
  Filled 2023-03-07 (×15): qty 1

## 2023-03-07 MED ORDER — CHLORHEXIDINE GLUCONATE CLOTH 2 % EX PADS
6.0000 | MEDICATED_PAD | Freq: Every day | CUTANEOUS | Status: DC
  Administered 2023-03-07 – 2023-03-21 (×14): 6 via TOPICAL

## 2023-03-07 MED ORDER — METOPROLOL TARTRATE 25 MG PO TABS
12.5000 mg | ORAL_TABLET | Freq: Two times a day (BID) | ORAL | Status: DC
  Administered 2023-03-07 – 2023-03-09 (×4): 12.5 mg via ORAL
  Filled 2023-03-07 (×5): qty 1

## 2023-03-07 MED ORDER — OXYCODONE HCL 5 MG PO TABS: 5.0000 mg | ORAL_TABLET | Freq: Four times a day (QID) | ORAL | Status: DC | PRN

## 2023-03-07 MED ORDER — AZITHROMYCIN 250 MG PO TABS
500.0000 mg | ORAL_TABLET | Freq: Every day | ORAL | Status: DC
  Administered 2023-03-07 – 2023-03-08 (×2): 500 mg via ORAL
  Filled 2023-03-07 (×2): qty 2

## 2023-03-07 MED ORDER — PANTOPRAZOLE SODIUM 40 MG PO TBEC
40.0000 mg | DELAYED_RELEASE_TABLET | Freq: Two times a day (BID) | ORAL | Status: DC
  Administered 2023-03-07 – 2023-03-21 (×30): 40 mg via ORAL
  Filled 2023-03-07 (×30): qty 1

## 2023-03-07 MED ORDER — ATENOLOL 25 MG PO TABS
12.5000 mg | ORAL_TABLET | Freq: Two times a day (BID) | ORAL | Status: DC
  Administered 2023-03-07 (×2): 12.5 mg via ORAL
  Filled 2023-03-07: qty 1
  Filled 2023-03-07: qty 0.5
  Filled 2023-03-07: qty 1

## 2023-03-07 MED ORDER — ONDANSETRON HCL 4 MG/2ML IJ SOLN
4.0000 mg | Freq: Four times a day (QID) | INTRAMUSCULAR | Status: DC | PRN
  Administered 2023-03-07: 4 mg via INTRAVENOUS
  Filled 2023-03-07: qty 2

## 2023-03-07 MED ORDER — SODIUM CHLORIDE 0.9 % IV SOLN
2.0000 g | INTRAVENOUS | Status: DC
  Administered 2023-03-07 – 2023-03-08 (×2): 2 g via INTRAVENOUS
  Filled 2023-03-07 (×2): qty 20

## 2023-03-07 MED ORDER — ACETAMINOPHEN 325 MG PO TABS: 650.0000 mg | ORAL_TABLET | Freq: Four times a day (QID) | ORAL | Status: DC | PRN

## 2023-03-07 MED ORDER — LACTATED RINGERS IV SOLN: INTRAVENOUS | Status: DC

## 2023-03-07 MED ORDER — LORAZEPAM 0.5 MG PO TABS
0.5000 mg | ORAL_TABLET | Freq: Two times a day (BID) | ORAL | Status: DC | PRN
  Administered 2023-03-07 – 2023-03-19 (×12): 0.5 mg via ORAL
  Filled 2023-03-07 (×13): qty 1

## 2023-03-07 MED ORDER — TAMSULOSIN HCL 0.4 MG PO CAPS
0.4000 mg | ORAL_CAPSULE | Freq: Every day | ORAL | Status: DC
  Administered 2023-03-07 – 2023-03-21 (×15): 0.4 mg via ORAL
  Filled 2023-03-07 (×15): qty 1

## 2023-03-07 MED ORDER — POTASSIUM CHLORIDE CRYS ER 20 MEQ PO TBCR
20.0000 meq | EXTENDED_RELEASE_TABLET | Freq: Two times a day (BID) | ORAL | Status: AC
  Administered 2023-03-07 – 2023-03-09 (×6): 20 meq via ORAL
  Filled 2023-03-07 (×6): qty 1

## 2023-03-07 MED ORDER — METOPROLOL TARTRATE 5 MG/5ML IV SOLN: 5.0000 mg | Freq: Four times a day (QID) | INTRAVENOUS | Status: DC | PRN

## 2023-03-07 MED ORDER — MECLIZINE HCL 25 MG PO TABS: 12.5000 mg | ORAL_TABLET | Freq: Two times a day (BID) | ORAL | Status: DC | PRN

## 2023-03-07 NOTE — Progress Notes (Addendum)
Brief same-day note:  Patient is a 77 year old female with history of non-Hodgkin's lymphoma, metastatic non-small cell lung cancer, hypertension, CKD stage IIIa, history of DVT/PE on Eliquis who was just discharged on 5/14 from here presented again with a fall.  She was admitted on 5/14 to the emergency department with complaint of dizziness, palpitations, was started on low-dose atenolol.  She was discharged on the same day after her palpitations , dizziness resolved.  Patient became increasingly dizzy and fell on the floor and could not get up out of at home.  She was brought to the emergency department by her sister.  EKG showed PVCs, incomplete left bundle branch block.  Patient was admitted for further management.  Patient seen and examined the bedside today.  During my evaluation, she appears comfortable.  On room air.  Denies any shortness of breath or cough or dysuria.  Her dizziness has improved but not entirely gone.  He still complains the room is spinning around her.   Assessment and plan:  dizziness/fall: recurrent episodes.  Recently admitted for the same and was discharged on 5/14.  Became dizzy and fell at home.  Unclear etiology, but might be multifactorial. EKG shows PVCs, sinus tachycardia. Will consult physical therapy. She complains that her room is spinning around her.  We also need to rule out the posterior brain stroke.  I will order MRI of the brain.  Continue meclizine for dizziness  Sinus tachycardia: Was taking metoprolol for 20 years, but recently stopped because of concern for hypotension on 03/02/2023.  After that she developed palpitation.  EKG showed sinus tachycardia, PVCs.  Continues to remain in mild sinus tachycardia.  Suspected beta-blocker withdrawal.  Started on atenolol 12.5 mg twice a day but she is saying she cannot tolerate and was attributing for the dizziness.  Will change to metoprolol low-dose  Suspected UTI: Denies any dysuria.  UA was suspicious for  UTI.  Urine culture sent.  Started on ceftriaxone  Chronic heart failure with reduced ejection fraction:   Last echo as per 01/17/2023 showed EF of 45 to 50%.  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  Does not take any diuretics at home.  Has mild bilateral lower extremity pitting edema.  Chronic hypoxic respiratory failure: Uses 2 L of oxygen at home as needed.CXR showed Low lung volumes with stable mild to moderate severity right middle lobe and right lower lobe atelectasis and/or infiltrate. CT angio on 5/13 showed new right lower lobe peribronchial wall thickening worrisome for bronchitis. Will continue ceftriaxone ,added  azithromycin  Hypertension: Currently blood pressure stable.  Continue current medications  CKD stage IIIa: Currently kidney function at baseline.  Baseline creatinine 1.1  History of DVT/PE: On Eliquis  Metastatic non-small cell lung cancer: Follows with Dr. Myrle Sheng.  Recently completed cycle 3 of carboplatin/Taxol/atezolizumab on 02/22/2023 .  We recommend continued follow-up with oncology as an outpatient.  Lung mass appears stable on recent imagings  Thrombocytopenia/normocytic anemia/leucocytosis: Hemoglobin in the range of 7.  No report of hematochezia or melena.  This is most likely secondary to effect of the chemotherapy/malignancy.  Continue to monitor CBC.  Will check FOBT also. Has mild leukocytosis.  Will continue to monitor.  Hypokalemia: Currently being monitored and supplemented as needed

## 2023-03-07 NOTE — Assessment & Plan Note (Signed)
She is undergoing chemo Lung mass appears stable

## 2023-03-07 NOTE — Progress Notes (Signed)
OT Cancellation Note  Patient Details Name: Crystal [name] MRN: [id number] DOB: April 10, 1946   Cancelled Treatment:    Reason Eval/Treat Not Completed: Patient declined, no reason specified Patient declined reporting that she did not want to throw up and dizziness was still present. Patient's family present In room . Patient and family were educated on importance of sitting upright in bed in chair position and pressure relief for bottom. Patient and family verbalized understanding.  OT to continue to follow and check back as schedule will allow.  Rosalio Loud, MS Acute Rehabilitation Department Office# [phone]  03/07/2023, 12:05 PM

## 2023-03-07 NOTE — ED Notes (Signed)
ED TO INPATIENT HANDOFF REPORT  Name/Age/Gender Crystal [name] 77 y.o. female  Code Status Code Status History     Date Active Date Inactive Code Status Order ID Comments User Context   03/05/2023 2356 03/06/2023 2052 DNR [id number]  Carollee Herter, DO ED   01/13/2023 0324 01/19/2023 2029 DNR [id number]  Gery Pray, MD Inpatient   01/13/2023 0023 01/13/2023 0323 DNR [id number]  Gery Pray, MD ED   12/08/2022 1441 12/18/2022 1835 DNR [id number]  Maryln Gottron, MD Inpatient   12/08/2022 1441 12/08/2022 1441 Full Code [id number]  Maryln Gottron, MD Inpatient   11/12/2022 0243 11/18/2022 1621 DNR [id number]  John Giovanni, MD ED   11/06/2022 1843 11/07/2022 0508 Full Code [id number]  Roanna Banning, MD [facility]   09/13/2022 2004 09/14/2022 1619 Full Code [id number]  Coralie Keens, MD Inpatient   06/09/2020 1603 06/10/2020 1538 Full Code [id number]  Princella Pellegrini Inpatient   04/22/2014 1519 04/23/2014 0330 Full Code [id number]  Richarda Overlie, MD [facility]    Questions for Most Recent Historical Code Status (Order [id number])     Question Answer   If patient has no pulse and is not breathing Do Not Attempt Resuscitation   If patient has a pulse and/or is breathing: Medical Treatment Goals LIMITED ADDITIONAL INTERVENTIONS: Use medication/IV fluids and cardiac monitoring as indicated; Do not use intubation or mechanical ventilation (DNI), also provide comfort medications.  Transfer to Progressive/Stepdown as indicated, avoid Intensive Care.   Consent: Discussion documented in EHR or advanced directives reviewed            Home/SNF/Other Home  Chief Complaint Postural dizziness with presyncope [R42, R55]  Level of Care/Admitting Diagnosis ED Disposition     ED Disposition  Admit   Condition  --   Comment  Hospital Area: [facility] [[id number]]  Level of Care: Med-Surg [16]  May place patient in observation at [facility] or [facility] if equivalent level of care is  available:: Yes  Covid Evaluation: Asymptomatic - no recent exposure (last 10 days) testing not required  Diagnosis: Postural dizziness with presyncope [[id number]]  Admitting Physician: Samara Snide  Attending Physician: Reva Bores [[id number]]          Medical History Past Medical History:  Diagnosis Date   Anxiety    Atherosclerosis of aorta (HCC)    Bloodstream infection due to Port-A-Cath 01/15/2023   Breast cancer (HCC) 1998   (Rt) lumpectomy dx 1999; Dr. Truett Perna   Dyslipidemia, goal LDL below 130    Essential hypertension    GERD (gastroesophageal reflux disease)    Hypercholesterolemia    IBS (irritable bowel syndrome)    Lymphoma (HCC)    lymphoma dx 11/28/10 - left neck   MSSA bacteremia 01/15/2023   non hodgkins lymphoma 12/2010   right aprotid gland   Nonischemic cardiomyopathy (HCC) 2008   ? Doxorubincin induced; essentially resolved as of echo in January 2014, current EF 50-55%. Grade 1 diastolic dysfunction.   Obesity    Severe obesity (BMI >= 40) (HCC) 05/21/2013   Improve to BMI of 39 by July 2015   Tremor     Allergies Allergies  Allergen Reactions   Simvastatin Other (See Comments)    Leg cramps   Zoloft [Sertraline] Nausea Only   Codeine Other (See Comments)    Bad Headaches   Coreg Other (See Comments)    "Made my legs hurt"   Lisinopril Cough   Tizanidine Hcl Rash and Other (  See Comments)    Hypotension, also    IV Location/Drains/Wounds Patient Lines/Drains/Airways Status     Active Line/Drains/Airways     Name Placement date Placement time Site Days   PICC Double Lumen 01/15/23 Left Basilic 42 cm 01/15/23  1254  -- 51   External Urinary Catheter 03/07/23  0021  --  less than 1   Incision - 4 Ports Abdomen Left;Medial Mid;Upper Right;Upper Right;Lower 06/09/20  1259  -- 1001   Pressure Injury 03/06/23 Buttocks Right;Medial Stage 2 -  Partial thickness loss of dermis presenting as a shallow open injury with a red, pink wound bed  without slough. 03/06/23  0050  -- 1   Wound / Incision (Open or Dehisced) 12/08/22 Irritant Dermatitis (Moisture Associated Skin Damage) Abdomen Lower;Bilateral 12/08/22  2011  Abdomen  89            Labs/Imaging Results for orders placed or performed during the hospital encounter of 03/06/23 (from the past 48 hour(s))  Comprehensive metabolic panel     Status: Abnormal   Collection Time: 03/06/23 10:15 PM  Result Value Ref Range   Sodium 135 135 - 145 mmol/L   Potassium 3.1 (L) 3.5 - 5.1 mmol/L   Chloride 101 98 - 111 mmol/L   CO2 26 22 - 32 mmol/L   Glucose, Bld 146 (H) 70 - 99 mg/dL    Comment: Glucose reference range applies only to samples taken after fasting for at least 8 hours.   BUN 9 8 - 23 mg/dL   Creatinine, Ser 3.87 (H) 0.44 - 1.00 mg/dL   Calcium 7.5 (L) 8.9 - 10.3 mg/dL   Total Protein 6.1 (L) 6.5 - 8.1 g/dL   Albumin 2.2 (L) 3.5 - 5.0 g/dL   AST 18 15 - 41 U/L   ALT 11 0 - 44 U/L   Alkaline Phosphatase 103 38 - 126 U/L   Total Bilirubin 0.4 0.3 - 1.2 mg/dL   GFR, Estimated 52 (L) >60 mL/min    Comment: (NOTE) Calculated using the CKD-EPI Creatinine Equation (2021)    Anion gap 8 5 - 15    Comment: Performed at [facility], [address] [place], [place] [address]  Troponin I (High Sensitivity)     Status: None   Collection Time: 03/06/23 10:15 PM  Result Value Ref Range   Troponin I (High Sensitivity) 11 <18 ng/L    Comment: (NOTE) Elevated high sensitivity troponin I (hsTnI) values and significant  changes across serial measurements may suggest ACS but many other  chronic and acute conditions are known to elevate hsTnI results.  Refer to the "Links" section for chest pain algorithms and additional  guidance. Performed at [facility], [address] [place], [place] [address]   CBC with Differential     Status: Abnormal   Collection Time: 03/06/23 10:15 PM  Result Value Ref Range   WBC 13.7 (H) 4.0 - 10.5  K/uL   RBC 2.72 (L) 3.87 - 5.11 MIL/uL   Hemoglobin 8.4 (L) 12.0 - 15.0 g/dL   HCT 84.1 (L) 66.0 - 63.0 %   MCV 100.4 (H) 80.0 - 100.0 fL   MCH 30.9 26.0 - 34.0 pg   MCHC 30.8 30.0 - 36.0 g/dL   RDW 16.0 (H) 10.9 - 32.3 %   Platelets 84 (L) 150 - 400 K/uL    Comment: Immature Platelet Fraction may be clinically indicated, consider ordering this additional test [id number] CONSISTENT WITH PREVIOUS RESULT  nRBC 0.1 0.0 - 0.2 %   Neutrophils Relative % 83 %   Neutro Abs 11.4 (H) 1.7 - 7.7 K/uL   Lymphocytes Relative 8 %   Lymphs Abs 1.1 0.7 - 4.0 K/uL   Monocytes Relative 6 %   Monocytes Absolute 0.8 0.1 - 1.0 K/uL   Eosinophils Relative 0 %   Eosinophils Absolute 0.0 0.0 - 0.5 K/uL   Basophils Relative 1 %   Basophils Absolute 0.1 0.0 - 0.1 K/uL   Immature Granulocytes 2 %   Abs Immature Granulocytes 0.24 (H) 0.00 - 0.07 K/uL    Comment: Performed at [facility], [address] [place], [place] [address]  Urinalysis, Routine w reflex microscopic -Urine, Clean Catch     Status: Abnormal   Collection Time: 03/06/23 10:15 PM  Result Value Ref Range   Color, Urine YELLOW YELLOW   APPearance HAZY (A) CLEAR   Specific Gravity, Urine 1.010 1.005 - 1.030   pH 8.0 5.0 - 8.0   Glucose, UA NEGATIVE NEGATIVE mg/dL   Hgb urine dipstick MODERATE (A) NEGATIVE   Bilirubin Urine NEGATIVE NEGATIVE   Ketones, ur NEGATIVE NEGATIVE mg/dL   Protein, ur NEGATIVE NEGATIVE mg/dL   Nitrite NEGATIVE NEGATIVE   Leukocytes,Ua SMALL (A) NEGATIVE   RBC / HPF 0-5 0 - 5 RBC/hpf   WBC, UA 6-10 0 - 5 WBC/hpf   Bacteria, UA MANY (A) NONE SEEN   Squamous Epithelial / HPF 6-10 0 - 5 /HPF   Mucus PRESENT     Comment: Performed at [facility], [address], [place], [place] [address]  Magnesium     Status: None   Collection Time: 03/06/23 10:15 PM  Result Value Ref Range   Magnesium 1.8 1.7 - 2.4 mg/dL    Comment: Performed at [name], [address], [place], [place] [address]  Brain natriuretic peptide     Status: Abnormal   Collection Time: 03/06/23 10:15 PM  Result Value Ref Range   B Natriuretic Peptide 405.0 (H) 0.0 - 100.0 pg/mL    Comment: Performed at [facility], [address] [place], [place] [address]   DG Chest Port 1 View  Result Date: 03/06/2023 CLINICAL DATA:  Syncope. EXAM: PORTABLE CHEST 1 VIEW COMPARISON:  Mar 05, 2023 FINDINGS: The heart size and mediastinal contours are within normal limits. There is moderate severity calcification of the aortic arch. Low lung volumes are noted. Stable mild to moderate severity right middle lobe and right lower lobe Karim, atelectasis and/or infiltrate is seen. No pleural effusion or pneumothorax is identified. Radiopaque surgical clips are seen along the lateral aspect of the right chest wall. The visualized skeletal structures are unremarkable. IMPRESSION: Low lung volumes with stable mild to moderate severity right middle lobe and right lower lobe atelectasis and/or infiltrate. Electronically Signed   By: Aram Candela M.D.   On: 03/06/2023 22:38   CT Angio Chest PE W and/or Wo Contrast  Result Date: 03/05/2023 CLINICAL DATA:  Positive D-dimer. Chest pain. History of lung cancer. EXAM: CT ANGIOGRAPHY CHEST WITH CONTRAST TECHNIQUE: Multidetector CT imaging of the chest was performed using the standard protocol during bolus administration of intravenous contrast. Multiplanar CT image reconstructions and MIPs were obtained to evaluate the vascular anatomy. RADIATION DOSE REDUCTION: This exam was performed according to the departmental dose-optimization program which includes automated exposure control, adjustment of the mA and/or kV according to patient size and/or use of iterative reconstruction technique.  CONTRAST:  75mL OMNIPAQUE IOHEXOL 350 MG/ML SOLN COMPARISON:  Chest CT 01/12/2023 FINDINGS: Cardiovascular: Satisfactory opacification of the pulmonary  arteries to the segmental level. No evidence of pulmonary embolism. Normal heart size. No pericardial effusion. There are atherosclerotic calcifications of the aorta. Right chest port has been removed in the interval. There is a new left-sided central venous catheter with distal tip ending at the brachiocephalic SVC junction. Mediastinum/Nodes: Mildly enlarged subcarinal lymph node measuring 1 cm appears unchanged. Other nonenlarged, prominent mediastinal lymph nodes appear stable. The esophagus and thyroid gland are within normal limits. Lungs/Pleura: Small partially loculated right pleural effusion appears stable. Right middle lobe central mass near the hilum appears unchanged measuring proximally 3.8 x 2.5 cm. There is some new right lower lobe peribronchial wall thickening. Linear opacities in the right middle lobe, right lower lobe and lingula appear unchanged. There is some mild patchy ground-glass opacities in both lungs. Upper Abdomen: Cholecystectomy clips are present. Musculoskeletal: There is a small fluid collection in the anterior chest wall subcutaneous tissues measuring 1.2 x 1.1 cm likely related to prior chest port. There surgical clips and skin thickening in the right breast, unchanged. Right axillary surgical clips are again noted. No acute fractures are seen. Degenerative changes affect the spine. Review of the MIP images confirms the above findings. IMPRESSION: 1. No evidence for pulmonary embolism. 2. New right lower lobe peribronchial wall thickening worrisome for bronchitis. Metastatic disease not excluded. 3. Stable small partially loculated right pleural effusion. 4. Stable right middle lobe central mass. 5. Stable mediastinal lymphadenopathy. 6. New small fluid collection in the anterior chest wall likely related to prior chest port. 7. New left-sided central venous catheter with distal tip ending at the brachiocephalic SVC junction. Aortic Atherosclerosis (ICD10-I70.0). Electronically  Signed   By: Darliss Cheney M.D.   On: 03/05/2023 22:49   CT Chest Wo Contrast  Result Date: 03/05/2023 CLINICAL DATA:  Abnormal x-ray EXAM: CT CHEST WITHOUT CONTRAST TECHNIQUE: Multidetector CT imaging of the chest was performed following the standard protocol without IV contrast. RADIATION DOSE REDUCTION: This exam was performed according to the departmental dose-optimization program which includes automated exposure control, adjustment of the mA and/or kV according to patient size and/or use of iterative reconstruction technique. COMPARISON:  Chest x-ray 03/05/2023, CT chest 01/12/2023, 09/13/2022 FINDINGS: Cardiovascular: Limited evaluation without intravenous contrast. Left-sided central venous catheter tip at the cavoatrial region. Mild aortic atherosclerosis. No aneurysm. Normal cardiac size. Trace pericardial effusion Mediastinum/Nodes: Midline trachea. No thyroid mass. Subcentimeter mediastinal lymph nodes. Decreased right paratracheal node on series 3, image 20, measuring 7 mm, previously 9 mm. Esophagus within normal limits. Lungs/Pleura: Small slightly loculated right pleural effusion with interim removal of previously noted right-sided chest tube. Fluid quantity is overall decreased compared to prior chest CT. Pleural thickening is also decreased compared to prior chest CT. Vague right middle lobe lung mass measuring about 3.4 by 1.9 cm on series 3, image 30, previously 3.6 x 2.3 cm. Mild right middle lobe air bronchograms with overall slightly improved aeration of right middle lobe compared to the prior chest CT. Upper Abdomen: No acute finding. 13 mm hypodensity within the superior spleen series 3, image 42 better seen with contrast, stable with recent priors. Musculoskeletal: No acute or suspicious osseous abnormality. Old left ninth rib fracture. IMPRESSION: 1. Removal of right-sided chest tube since prior CT from March. Small slightly loculated right pleural effusion, overall decreased compared  to the prior chest CT with decreased pleural thickening. Slightly improved aeration of  right middle lobe compared to prior chest CT. Vague right middle lobe lung mass appears slightly decreased in size compared to prior chest CT. 2. Decreased size of multiple mediastinal lymph nodes. 3. Aortic atherosclerosis. Aortic Atherosclerosis (ICD10-I70.0). Electronically Signed   By: Jasmine Pang M.D.   On: 03/05/2023 20:39   DG Chest Port 1 View  Result Date: 03/05/2023 CLINICAL DATA:  Questionable sepsis - evaluate for abnormality EXAM: PORTABLE CHEST 1 VIEW COMPARISON:  01/12/2023 FINDINGS: Interval removal of Port-A-Cath. Rounded opacity in the right mid to lower lung may reflect fluid within the fissure. Versus airspace opacity. Right pleural effusion or pleural thickening noted. Left lung clear. Heart and mediastinal contours within normal limits. Left PICC line in place with the tip in the SVC. IMPRESSION: Rounded opacity in the right mid to lower lung may reflect loculated fluid within the fissure or rounded opacity/airspace disease. Right pleural effusion versus pleural thickening. Findings could be further evaluated with chest CT if felt clinically indicated. Electronically Signed   By: Charlett Nose M.D.   On: 03/05/2023 19:14    Pending Labs Unresulted Labs (From admission, onward)     Start     Ordered   03/07/23 0030  TSH  Once,   R        03/07/23 0029            Vitals/Pain Today's Vitals   03/06/23 2230 03/06/23 2300 03/06/23 2330 03/07/23 0000  BP: 116/60 127/81 123/69 121/77  Pulse: (!) 109 (!) 109 (!) 107 98  Resp: 19 (!) 24 (!) 24 (!) 24  Temp:      TempSrc:      SpO2: 99% 92% 94% 96%  Weight:      Height:      PainSc:        Isolation Precautions No active isolations  Medications Medications  metoprolol tartrate (LOPRESSOR) injection 5 mg (5 mg Intravenous Given 03/06/23 2349)    Mobility walks with person assist

## 2023-03-07 NOTE — Assessment & Plan Note (Signed)
Unclear etiology could be multifactorial to include possible UTI, benign positional vertigo, frequent PVCs and tachycardia related to beta-blocker withdrawal Anemia with hemoglobin of 8.4

## 2023-03-07 NOTE — Assessment & Plan Note (Signed)
Hemoglobin continues to slowly trend down was 10.6 approximately 3 weeks ago Repeat in a.m. and consider transfusion if needed.

## 2023-03-07 NOTE — Assessment & Plan Note (Signed)
Questionably related to withdrawal of metoprolol which she has been on for 20 years.  She was sent home on atenolol 12.5 twice daily and remains mildly tachycardic.  EKG also shows prevalent PVCs within normal troponin. Repeat EKG in the morning Continue beta-blockers

## 2023-03-07 NOTE — H&P (Signed)
History and Physical    Patient: Crystal [name] ZOX:[id number] DOB: 1946/07/17 DOA: 03/06/2023 DOS: the patient was seen and examined on 03/07/2023 PCP: Blair Heys, MD  Patient coming from: Home  Chief Complaint:  Chief Complaint  Patient presents with   Fall   HPI: Crystal [name] is a 77 y.o. female with medical history significant of non-Hodgkin's lymphoma, metastatic non-small cell lung cancer, HTN, CKD 3A, history of DVT and PE on Eliquis who was recently discharged yesterday after being admitted with dizziness.  At that time was felt to be beta-blocker withdrawal that causes tachycardia and her to become so dizzy.  She was observed overnight restarted on atenolol and sent home.  When she got home today she reported increasing dizziness and she fell to the floor and could not get up.  At that point her sister called EMS and she was brought back to the hospital.  She was given another dose of IV beta-blockade and heart rate improved to the low 100s.  She has mild oxygen requirement which is not new she has home oxygen if needed.  She was given Rocephin yesterday and urinalysis is consistent with a possible UTI.  The patient also reports room spinning.  EKG is significant for PVCs and an incomplete left bundle branch block. Review of Systems: As mentioned in the history of present illness. All other systems reviewed and are negative. Past Medical History:  Diagnosis Date   Anxiety    Atherosclerosis of aorta (HCC)    Bloodstream infection due to Port-A-Cath 01/15/2023   Breast cancer (HCC) 1998   (Rt) lumpectomy dx 1999; Dr. Truett Perna   Dyslipidemia, goal LDL below 130    Essential hypertension    GERD (gastroesophageal reflux disease)    Hypercholesterolemia    IBS (irritable bowel syndrome)    Lymphoma (HCC)    lymphoma dx 11/28/10 - left neck   MSSA bacteremia 01/15/2023   non hodgkins lymphoma 12/2010   right aprotid gland   Nonischemic cardiomyopathy (HCC) 2008   ?  Doxorubincin induced; essentially resolved as of echo in January 2014, current EF 50-55%. Grade 1 diastolic dysfunction.   Obesity    Severe obesity (BMI >= 40) (HCC) 05/21/2013   Improve to BMI of 39 by July 2015   Tremor    Past Surgical History:  Procedure Laterality Date   ABDOMINAL HYSTERECTOMY     BSO   ANKLE FRACTURE SURGERY Left    BREAST EXCISIONAL BIOPSY Left    BREAST EXCISIONAL BIOPSY Right    BREAST LUMPECTOMY Right 1998   BREAST LUMPECTOMY Right 08/25/2020   Procedure: RIGHT BREAST LUMPECTOMY;  Surgeon: Abigail Miyamoto, MD;  Location: [facility];  Service: General;  Laterality: Right;   CHEST TUBE INSERTION Left 11/16/2022   Procedure: INSERTION PLEURAL DRAINAGE CATHETER;  Surgeon: Martina Sinner, MD;  Location: [facility] ENDOSCOPY;  Service: Pulmonary;  Laterality: Left;  afternoon scheduling time please   CHOLECYSTECTOMY N/A 06/09/2020   Procedure: LAPAROSCOPIC CHOLECYSTECTOMY;  Surgeon: Abigail Miyamoto, MD;  Location: [facility] ORS;  Service: General;  Laterality: N/A;   COLONOSCOPY     IR BONE TUMOR(S)RF ABLATION  11/06/2022   IR IMAGING GUIDED PORT INSERTION  11/15/2022   IR KYPHO LUMBAR INC FX REDUCE BONE BX UNI/BIL CANNULATION INC/IMAGING  11/06/2022   IR RADIOLOGIST EVAL & MGMT  10/26/2022   IR REMOVAL TUN ACCESS W/ PORT W/O FL MOD SED  01/15/2023   LEFT HEART CATH AND CORONARY ANGIOGRAPHY  12/2007  None coronary disease, EF 45% (up from nuclear study EF of 30% in June '08)   NM MYOVIEW LTD  03/29/2007   EF 33%,NEGATIVE ISCHEMIA, prob need cath   NM MYOVIEW LTD  05/01/2014   Lexiscan: EF 55%. Normal wall motion; no ischemia or infarction; apical thinning   PORTACATH PLACEMENT  08/25/2013   rt. with tip in cavoatrial junction, Dr.Yamagata    TEE WITHOUT CARDIOVERSION N/A 01/17/2023   Procedure: TRANSESOPHAGEAL ECHOCARDIOGRAM (TEE);  Surgeon: Meriam Sprague, MD;  Location: [facility] ENDOSCOPY;  Service: Cardiovascular;  Laterality: N/A;   TRANSTHORACIC  ECHOCARDIOGRAM  05/01/2014   Normal LV size with low normal function. EF of 50-55% and Gr 1 DD; aortic sclerosis without stenosis. MAC and thickening/calcification of the anterior leaflet - no notable AI / AS or MR/MS   Social History:  reports that she has never smoked. She has never used smokeless tobacco. She reports current alcohol use. She reports that she does not use drugs.  Allergies  Allergen Reactions   Simvastatin Other (See Comments)    Leg cramps   Zoloft [Sertraline] Nausea Only   Codeine Other (See Comments)    Bad Headaches   Coreg Other (See Comments)    "Made my legs hurt"   Lisinopril Cough   Tizanidine Hcl Rash and Other (See Comments)    Hypotension, also    Family History  Problem Relation Age of Onset   Heart Problems Mother        CABG   Chronic Renal Failure Mother    Heart Problems Father    Hypertension Sister    Cancer Sister        bladder   Migraines Sister    Heart attack Sister    Hypertension Sister        x2   Lupus Brother    Hypertension Brother        and lupus   Heart Problems Maternal Grandmother    Stroke Maternal Grandfather    Cancer Paternal Grandmother        stomach   Heart Problems Paternal Grandfather     Prior to Admission medications   Medication Sig Start Date End Date Taking? Authorizing Provider  acetaminophen (TYLENOL) 325 MG tablet Take 2 tablets (650 mg total) by mouth every 6 (six) hours as needed for mild pain or moderate pain. 09/14/22   Arrien, York Ram, MD  apixaban (ELIQUIS) 5 MG TABS tablet TAKE 1 TABLET(5 MG) BY MOUTH TWICE DAILY Patient taking differently: Take 5 mg by mouth 2 (two) times daily. 02/26/23   Ladene Artist, MD  atenolol (TENORMIN) 25 MG tablet Take 0.5 tablets (12.5 mg total) by mouth 2 (two) times daily. 03/06/23 06/04/23  [place], Alvira Philips, DO  folic acid (FOLVITE) 1 MG tablet Take 1 tablet (1 mg total) by mouth daily. 11/29/22   Rana Snare, NP  LORazepam (ATIVAN) 0.5 MG tablet Take  1 tablet (0.5 mg total) by mouth 2 (two) times daily as needed for anxiety. 02/14/23   Rana Snare, NP  magnesium oxide (MAG-OX) 400 (240 Mg) MG tablet TAKE 1 TABLET(400 MG) BY MOUTH DAILY Patient taking differently: Take 400 mg by mouth 2 (two) times daily. 01/30/23   Ladene Artist, MD  oxyCODONE (OXY IR/ROXICODONE) 5 MG immediate release tablet Take 1 tablet (5 mg total) by mouth every 6 (six) hours as needed for severe pain. 02/14/23   Rana Snare, NP  pantoprazole (PROTONIX) 40 MG tablet TAKE 1 TABLET(40  MG) BY MOUTH TWICE DAILY BEFORE A MEAL Patient taking differently: Take 40 mg by mouth 2 (two) times daily. 02/28/23   Ladene Artist, MD  potassium chloride (KLOR-CON M) 10 MEQ tablet TAKE 3 TABLETS(30 MEQ) BY MOUTH DAILY Patient taking differently: Take 10 mEq by mouth in the morning, at noon, in the evening, and at bedtime. 02/28/23   Ladene Artist, MD    Physical Exam: Vitals:   03/06/23 2230 03/06/23 2300 03/06/23 2330 03/07/23 0000  BP: 116/60 127/81 123/69 121/77  Pulse: (!) 109 (!) 109 (!) 107 98  Resp: 19 (!) 24 (!) 24 (!) 24  Temp:      TempSrc:      SpO2: 99% 92% 94% 96%  Weight:      Height:       Physical Examination: General appearance - alert, well appearing, and in no distress Chest - clear to auscultation, no wheezes, rales or rhonchi, symmetric air entry Heart -tachycardic, no rubs gallops murmurs Abdomen - soft, nontender, nondistended, no masses or organomegaly Extremities - peripheral pulses normal, no pedal edema, no clubbing or cyanosis  Data Reviewed: Results for orders placed or performed during the hospital encounter of 03/06/23 (from the past 24 hour(s))  Comprehensive metabolic panel     Status: Abnormal   Collection Time: 03/06/23 10:15 PM  Result Value Ref Range   Sodium 135 135 - 145 mmol/L   Potassium 3.1 (L) 3.5 - 5.1 mmol/L   Chloride 101 98 - 111 mmol/L   CO2 26 22 - 32 mmol/L   Glucose, Bld 146 (H) 70 - 99 mg/dL   BUN 9 8 - 23 mg/dL    Creatinine, Ser 1.61 (H) 0.44 - 1.00 mg/dL   Calcium 7.5 (L) 8.9 - 10.3 mg/dL   Total Protein 6.1 (L) 6.5 - 8.1 g/dL   Albumin 2.2 (L) 3.5 - 5.0 g/dL   AST 18 15 - 41 U/L   ALT 11 0 - 44 U/L   Alkaline Phosphatase 103 38 - 126 U/L   Total Bilirubin 0.4 0.3 - 1.2 mg/dL   GFR, Estimated 52 (L) >60 mL/min   Anion gap 8 5 - 15  Troponin I (High Sensitivity)     Status: None   Collection Time: 03/06/23 10:15 PM  Result Value Ref Range   Troponin I (High Sensitivity) 11 <18 ng/L  CBC with Differential     Status: Abnormal   Collection Time: 03/06/23 10:15 PM  Result Value Ref Range   WBC 13.7 (H) 4.0 - 10.5 K/uL   RBC 2.72 (L) 3.87 - 5.11 MIL/uL   Hemoglobin 8.4 (L) 12.0 - 15.0 g/dL   HCT 09.6 (L) 04.5 - 40.9 %   MCV 100.4 (H) 80.0 - 100.0 fL   MCH 30.9 26.0 - 34.0 pg   MCHC 30.8 30.0 - 36.0 g/dL   RDW 81.1 (H) 91.4 - 78.2 %   Platelets 84 (L) 150 - 400 K/uL   nRBC 0.1 0.0 - 0.2 %   Neutrophils Relative % 83 %   Neutro Abs 11.4 (H) 1.7 - 7.7 K/uL   Lymphocytes Relative 8 %   Lymphs Abs 1.1 0.7 - 4.0 K/uL   Monocytes Relative 6 %   Monocytes Absolute 0.8 0.1 - 1.0 K/uL   Eosinophils Relative 0 %   Eosinophils Absolute 0.0 0.0 - 0.5 K/uL   Basophils Relative 1 %   Basophils Absolute 0.1 0.0 - 0.1 K/uL   Immature Granulocytes 2 %  Abs Immature Granulocytes 0.24 (H) 0.00 - 0.07 K/uL  Urinalysis, Routine w reflex microscopic -Urine, Clean Catch     Status: Abnormal   Collection Time: 03/06/23 10:15 PM  Result Value Ref Range   Color, Urine YELLOW YELLOW   APPearance HAZY (A) CLEAR   Specific Gravity, Urine 1.010 1.005 - 1.030   pH 8.0 5.0 - 8.0   Glucose, UA NEGATIVE NEGATIVE mg/dL   Hgb urine dipstick MODERATE (A) NEGATIVE   Bilirubin Urine NEGATIVE NEGATIVE   Ketones, ur NEGATIVE NEGATIVE mg/dL   Protein, ur NEGATIVE NEGATIVE mg/dL   Nitrite NEGATIVE NEGATIVE   Leukocytes,Ua SMALL (A) NEGATIVE   RBC / HPF 0-5 0 - 5 RBC/hpf   WBC, UA 6-10 0 - 5 WBC/hpf   Bacteria, UA  MANY (A) NONE SEEN   Squamous Epithelial / HPF 6-10 0 - 5 /HPF   Mucus PRESENT   Magnesium     Status: None   Collection Time: 03/06/23 10:15 PM  Result Value Ref Range   Magnesium 1.8 1.7 - 2.4 mg/dL  Brain natriuretic peptide     Status: Abnormal   Collection Time: 03/06/23 10:15 PM  Result Value Ref Range   B Natriuretic Peptide 405.0 (H) 0.0 - 100.0 pg/mL   DG Chest Port 1 View  Result Date: 03/06/2023 CLINICAL DATA:  Syncope. EXAM: PORTABLE CHEST 1 VIEW COMPARISON:  Mar 05, 2023 FINDINGS: The heart size and mediastinal contours are within normal limits. There is moderate severity calcification of the aortic arch. Low lung volumes are noted. Stable mild to moderate severity right middle lobe and right lower lobe Karim, atelectasis and/or infiltrate is seen. No pleural effusion or pneumothorax is identified. Radiopaque surgical clips are seen along the lateral aspect of the right chest wall. The visualized skeletal structures are unremarkable. IMPRESSION: Low lung volumes with stable mild to moderate severity right middle lobe and right lower lobe atelectasis and/or infiltrate. Electronically Signed   By: Aram Candela M.D.   On: 03/06/2023 22:38   CT Angio Chest PE W and/or Wo Contrast  Result Date: 03/05/2023 CLINICAL DATA:  Positive D-dimer. Chest pain. History of lung cancer. EXAM: CT ANGIOGRAPHY CHEST WITH CONTRAST TECHNIQUE: Multidetector CT imaging of the chest was performed using the standard protocol during bolus administration of intravenous contrast. Multiplanar CT image reconstructions and MIPs were obtained to evaluate the vascular anatomy. RADIATION DOSE REDUCTION: This exam was performed according to the departmental dose-optimization program which includes automated exposure control, adjustment of the mA and/or kV according to patient size and/or use of iterative reconstruction technique. CONTRAST:  75mL OMNIPAQUE IOHEXOL 350 MG/ML SOLN COMPARISON:  Chest CT 01/12/2023  FINDINGS: Cardiovascular: Satisfactory opacification of the pulmonary arteries to the segmental level. No evidence of pulmonary embolism. Normal heart size. No pericardial effusion. There are atherosclerotic calcifications of the aorta. Right chest port has been removed in the interval. There is a new left-sided central venous catheter with distal tip ending at the brachiocephalic SVC junction. Mediastinum/Nodes: Mildly enlarged subcarinal lymph node measuring 1 cm appears unchanged. Other nonenlarged, prominent mediastinal lymph nodes appear stable. The esophagus and thyroid gland are within normal limits. Lungs/Pleura: Small partially loculated right pleural effusion appears stable. Right middle lobe central mass near the hilum appears unchanged measuring proximally 3.8 x 2.5 cm. There is some new right lower lobe peribronchial wall thickening. Linear opacities in the right middle lobe, right lower lobe and lingula appear unchanged. There is some mild patchy ground-glass opacities in both lungs. Upper Abdomen:  Cholecystectomy clips are present. Musculoskeletal: There is a small fluid collection in the anterior chest wall subcutaneous tissues measuring 1.2 x 1.1 cm likely related to prior chest port. There surgical clips and skin thickening in the right breast, unchanged. Right axillary surgical clips are again noted. No acute fractures are seen. Degenerative changes affect the spine. Review of the MIP images confirms the above findings. IMPRESSION: 1. No evidence for pulmonary embolism. 2. New right lower lobe peribronchial wall thickening worrisome for bronchitis. Metastatic disease not excluded. 3. Stable small partially loculated right pleural effusion. 4. Stable right middle lobe central mass. 5. Stable mediastinal lymphadenopathy. 6. New small fluid collection in the anterior chest wall likely related to prior chest port. 7. New left-sided central venous catheter with distal tip ending at the brachiocephalic  SVC junction. Aortic Atherosclerosis (ICD10-I70.0). Electronically Signed   By: Darliss Cheney M.D.   On: 03/05/2023 22:49   CT Chest Wo Contrast  Result Date: 03/05/2023 CLINICAL DATA:  Abnormal x-ray EXAM: CT CHEST WITHOUT CONTRAST TECHNIQUE: Multidetector CT imaging of the chest was performed following the standard protocol without IV contrast. RADIATION DOSE REDUCTION: This exam was performed according to the departmental dose-optimization program which includes automated exposure control, adjustment of the mA and/or kV according to patient size and/or use of iterative reconstruction technique. COMPARISON:  Chest x-ray 03/05/2023, CT chest 01/12/2023, 09/13/2022 FINDINGS: Cardiovascular: Limited evaluation without intravenous contrast. Left-sided central venous catheter tip at the cavoatrial region. Mild aortic atherosclerosis. No aneurysm. Normal cardiac size. Trace pericardial effusion Mediastinum/Nodes: Midline trachea. No thyroid mass. Subcentimeter mediastinal lymph nodes. Decreased right paratracheal node on series 3, image 20, measuring 7 mm, previously 9 mm. Esophagus within normal limits. Lungs/Pleura: Small slightly loculated right pleural effusion with interim removal of previously noted right-sided chest tube. Fluid quantity is overall decreased compared to prior chest CT. Pleural thickening is also decreased compared to prior chest CT. Vague right middle lobe lung mass measuring about 3.4 by 1.9 cm on series 3, image 30, previously 3.6 x 2.3 cm. Mild right middle lobe air bronchograms with overall slightly improved aeration of right middle lobe compared to the prior chest CT. Upper Abdomen: No acute finding. 13 mm hypodensity within the superior spleen series 3, image 42 better seen with contrast, stable with recent priors. Musculoskeletal: No acute or suspicious osseous abnormality. Old left ninth rib fracture. IMPRESSION: 1. Removal of right-sided chest tube since prior CT from March. Small  slightly loculated right pleural effusion, overall decreased compared to the prior chest CT with decreased pleural thickening. Slightly improved aeration of right middle lobe compared to prior chest CT. Vague right middle lobe lung mass appears slightly decreased in size compared to prior chest CT. 2. Decreased size of multiple mediastinal lymph nodes. 3. Aortic atherosclerosis. Aortic Atherosclerosis (ICD10-I70.0). Electronically Signed   By: Jasmine Pang M.D.   On: 03/05/2023 20:39   DG Chest Port 1 View  Result Date: 03/05/2023 CLINICAL DATA:  Questionable sepsis - evaluate for abnormality EXAM: PORTABLE CHEST 1 VIEW COMPARISON:  01/12/2023 FINDINGS: Interval removal of Port-A-Cath. Rounded opacity in the right mid to lower lung may reflect fluid within the fissure. Versus airspace opacity. Right pleural effusion or pleural thickening noted. Left lung clear. Heart and mediastinal contours within normal limits. Left PICC line in place with the tip in the SVC. IMPRESSION: Rounded opacity in the right mid to lower lung may reflect loculated fluid within the fissure or rounded opacity/airspace disease. Right pleural effusion versus pleural thickening. Findings  could be further evaluated with chest CT if felt clinically indicated. Electronically Signed   By: Charlett Nose M.D.   On: 03/05/2023 19:14     Assessment and Plan: * Postural dizziness with presyncope Unclear etiology could be multifactorial to include possible UTI, benign positional vertigo, frequent PVCs and tachycardia related to beta-blocker withdrawal Anemia with hemoglobin of 8.4  Sinus tachycardia Questionably related to withdrawal of metoprolol which she has been on for 20 years.  She was sent home on atenolol 12.5 twice daily and remains mildly tachycardic.  EKG also shows prevalent PVCs within normal troponin. Repeat EKG in the morning Continue beta-blockers  Chronic HFrEF (heart failure with reduced ejection fraction)  (HCC) Appears euvolemic and compensated  CKD stage 3a, GFR 45-59 ml/min (HCC) - baseline SCr 0.9-1.1 Serum creatinine is 1.10 which is stable for her Avoid nephrotoxic agents Trend  Metastatic non-small cell lung cancer (HCC) She is undergoing chemo Lung mass appears stable  Dyslipidemia, goal LDL below 100 Low-cholesterol diet  Essential hypertension Continue atenolol  UTI (urinary tract infection) Question this is worsening the perception of dizziness Has significant blood in her urine as well as WBCs. Urine culture Rocephin  Anemia associated with chemotherapy Hemoglobin continues to slowly trend down was 10.6 approximately 3 weeks ago Repeat in a.m. and consider transfusion if needed.  Thrombocytopenia (HCC) Likely related to chemotherapy Platelets appear stable Trend  Frequent PVCs Unclear etiology Normal troponin Repeat EKG in a.m.  History of DVT (deep vein thrombosis) On Eliquis Negative CTPA 24 hours ago.  Hypokalemia Replete and trend This may be causing some of her palpitations      Advance Care Planning:   Code Status: Prior DNI/DNR  Consults: None  Family Communication: Patient at bedside  Severity of Illness: The appropriate patient status for this patient is OBSERVATION. Observation status is judged to be reasonable and necessary in order to provide the required intensity of service to ensure the patient's safety. The patient's presenting symptoms, physical exam findings, and initial radiographic and laboratory data in the context of their medical condition is felt to place them at decreased risk for further clinical deterioration. Furthermore, it is anticipated that the patient will be medically stable for discharge from the hospital within 2 midnights of admission.   Author: Reva Bores, MD 03/07/2023 12:33 AM  For on call review [email or web address].

## 2023-03-07 NOTE — Assessment & Plan Note (Signed)
Serum creatinine is 1.10 which is stable for her Avoid nephrotoxic agents Trend

## 2023-03-07 NOTE — Assessment & Plan Note (Signed)
Replete and trend This may be causing some of her palpitations

## 2023-03-07 NOTE — Assessment & Plan Note (Signed)
Question this is worsening the perception of dizziness Has significant blood in her urine as well as WBCs. Urine culture Rocephin

## 2023-03-07 NOTE — Progress Notes (Signed)
Patient ID: Crystal [name], female   DOB: February 05, 1946, 77 y.o.   MRN: [id number] BP 115/75 (BP Location: Right Arm)   Pulse 96   Temp 98 F (36.7 C) (Oral)   Resp 18   Ht 5\' 5"  (1.651 m)   Wt 91.2 kg   SpO2 93%   BMI 33.46 kg/m  MRI brain reviewed. The falcine subdural is not an operative risk. Risk of continued eliquis now that patient is not a fall risk, but someone who falls should be carefully considered. I believe the risk of continued falls is significant, and anticoagulation will cause more problems. This has to be balanced by primary team with regards to the DVT danger.  No need to repeat cranial imaging. Recommend stopping eliquis for 72 hours, then decide if risks of intracranial hemorrhaging in the future is less than DVT.

## 2023-03-07 NOTE — Assessment & Plan Note (Signed)
Likely related to chemotherapy Platelets appear stable Trend

## 2023-03-07 NOTE — Assessment & Plan Note (Signed)
Low cholesterol diet

## 2023-03-07 NOTE — Assessment & Plan Note (Addendum)
Appears euvolemic and compensated

## 2023-03-07 NOTE — Assessment & Plan Note (Signed)
On Eliquis Negative CTPA 24 hours ago.

## 2023-03-07 NOTE — Assessment & Plan Note (Signed)
Unclear etiology Normal troponin Repeat EKG in a.m.

## 2023-03-07 NOTE — Assessment & Plan Note (Signed)
Continue atenolol. 

## 2023-03-08 DIAGNOSIS — R195 Other fecal abnormalities: Secondary | ICD-10-CM | POA: Diagnosis not present

## 2023-03-08 DIAGNOSIS — D649 Anemia, unspecified: Secondary | ICD-10-CM | POA: Diagnosis not present

## 2023-03-08 DIAGNOSIS — R531 Weakness: Secondary | ICD-10-CM | POA: Diagnosis not present

## 2023-03-08 DIAGNOSIS — R197 Diarrhea, unspecified: Secondary | ICD-10-CM | POA: Diagnosis not present

## 2023-03-08 DIAGNOSIS — R55 Syncope and collapse: Secondary | ICD-10-CM | POA: Diagnosis not present

## 2023-03-08 DIAGNOSIS — R42 Dizziness and giddiness: Secondary | ICD-10-CM | POA: Diagnosis not present

## 2023-03-08 DIAGNOSIS — D696 Thrombocytopenia, unspecified: Secondary | ICD-10-CM | POA: Diagnosis not present

## 2023-03-08 LAB — BASIC METABOLIC PANEL
Anion gap: 7 (ref 5–15)
BUN: 8 mg/dL (ref 8–23)
CO2: 28 mmol/L (ref 22–32)
Calcium: 7.6 mg/dL — ABNORMAL LOW (ref 8.9–10.3)
Chloride: 105 mmol/L (ref 98–111)
Creatinine, Ser: 1.1 mg/dL — ABNORMAL HIGH (ref 0.44–1.00)
GFR, Estimated: 52 mL/min — ABNORMAL LOW (ref >60)
Glucose, Bld: 103 mg/dL — ABNORMAL HIGH (ref 70–99)
Potassium: 3.3 mmol/L — ABNORMAL LOW (ref 3.5–5.1)
Sodium: 140 mmol/L (ref 135–145)

## 2023-03-08 LAB — CBC
HCT: 27 % — ABNORMAL LOW (ref 36.0–46.0)
Hemoglobin: 8.4 g/dL — ABNORMAL LOW (ref 12.0–15.0)
MCH: 31.5 pg (ref 26.0–34.0)
MCHC: 31.1 g/dL (ref 30.0–36.0)
MCV: 101.1 fL — ABNORMAL HIGH (ref 80.0–100.0)
Platelets: 99 10*3/uL — ABNORMAL LOW (ref 150–400)
RBC: 2.67 MIL/uL — ABNORMAL LOW (ref 3.87–5.11)
RDW: 20 % — ABNORMAL HIGH (ref 11.5–15.5)
WBC: 9.7 10*3/uL (ref 4.0–10.5)
nRBC: 0 % (ref 0.0–0.2)

## 2023-03-08 LAB — URINE CULTURE

## 2023-03-08 LAB — IRON AND TIBC
Iron: 36 ug/dL (ref 28–170)
Saturation Ratios: 28 % (ref 10.4–31.8)
TIBC: 127 ug/dL — ABNORMAL LOW (ref 250–450)
UIBC: 91 ug/dL

## 2023-03-08 LAB — CULTURE, BLOOD (ROUTINE X 2): Special Requests: ADEQUATE

## 2023-03-08 LAB — OCCULT BLOOD X 1 CARD TO LAB, STOOL: Fecal Occult Bld: POSITIVE — AB

## 2023-03-08 MED ORDER — SODIUM CHLORIDE 0.9 % IV SOLN
1.0000 g | INTRAVENOUS | Status: AC
  Administered 2023-03-09: 1 g via INTRAVENOUS
  Filled 2023-03-08: qty 10

## 2023-03-08 NOTE — Consult Note (Signed)
[facility] Consultation Note  Referring Provider: No ref. provider found Primary Care Physician:  Blair Heys, MD  Reason for Consultation:  dizziness, anemia, hemoccult positive stool  HPI: Crystal [name] is a 77 y.o. female multiple medical problems including non-small cell lung cancer on chemotherapy.  Presenting to ED for dizziness and weakness, fall.  Small subdural/subarachnoid hematoma on imaging.  No abdominal pain, hematemesis, melena, hematochezia.     Past Medical History:  Diagnosis Date   Anxiety    Atherosclerosis of aorta (HCC)    Bloodstream infection due to Port-A-Cath 01/15/2023   Breast cancer (HCC) 1998   (Rt) lumpectomy dx 1999; Dr. Truett Perna   Dyslipidemia, goal LDL below 130    Essential hypertension    GERD (gastroesophageal reflux disease)    Hypercholesterolemia    IBS (irritable bowel syndrome)    Lymphoma (HCC)    lymphoma dx 11/28/10 - left neck   MSSA bacteremia 01/15/2023   non hodgkins lymphoma 12/2010   right aprotid gland   Nonischemic cardiomyopathy (HCC) 2008   ? Doxorubincin induced; essentially resolved as of echo in January 2014, current EF 50-55%. Grade 1 diastolic dysfunction.   Obesity    Severe obesity (BMI >= 40) (HCC) 05/21/2013   Improve to BMI of 39 by July 2015   Tremor     Past Surgical History:  Procedure Laterality Date   ABDOMINAL HYSTERECTOMY     BSO   ANKLE FRACTURE SURGERY Left    BREAST EXCISIONAL BIOPSY Left    BREAST EXCISIONAL BIOPSY Right    BREAST LUMPECTOMY Right 1998   BREAST LUMPECTOMY Right 08/25/2020   Procedure: RIGHT BREAST LUMPECTOMY;  Surgeon: Abigail Miyamoto, MD;  Location:  SURGERY CENTER;  Service: General;  Laterality: Right;   CHEST TUBE INSERTION Left 11/16/2022   Procedure: INSERTION PLEURAL DRAINAGE CATHETER;  Surgeon: Martina Sinner, MD;  Location: [facility] ENDOSCOPY;  Service: Pulmonary;  Laterality: Left;  afternoon scheduling time please   CHOLECYSTECTOMY N/A  06/09/2020   Procedure: LAPAROSCOPIC CHOLECYSTECTOMY;  Surgeon: Abigail Miyamoto, MD;  Location: [facility] ORS;  Service: General;  Laterality: N/A;   COLONOSCOPY     IR BONE TUMOR(S)RF ABLATION  11/06/2022   IR IMAGING GUIDED PORT INSERTION  11/15/2022   IR KYPHO LUMBAR INC FX REDUCE BONE BX UNI/BIL CANNULATION INC/IMAGING  11/06/2022   IR RADIOLOGIST EVAL & MGMT  10/26/2022   IR REMOVAL TUN ACCESS W/ PORT W/O FL MOD SED  01/15/2023   LEFT HEART CATH AND CORONARY ANGIOGRAPHY  12/2007   None coronary disease, EF 45% (up from nuclear study EF of 30% in June '08)   NM MYOVIEW LTD  03/29/2007   EF 33%,NEGATIVE ISCHEMIA, prob need cath   NM MYOVIEW LTD  05/01/2014   Lexiscan: EF 55%. Normal wall motion; no ischemia or infarction; apical thinning   PORTACATH PLACEMENT  08/25/2013   rt. with tip in cavoatrial junction, Dr.Yamagata    TEE WITHOUT CARDIOVERSION N/A 01/17/2023   Procedure: TRANSESOPHAGEAL ECHOCARDIOGRAM (TEE);  Surgeon: Meriam Sprague, MD;  Location[facility] ENDOSCOPY;  Service: Cardiovascular;  Laterality: N/A;   TRANSTHORACIC ECHOCARDIOGRAM  05/01/2014   Normal LV size with low normal function. EF of 50-55% and Gr 1 DD; aortic sclerosis without stenosis. MAC and thickening/calcification of the anterior leaflet - no notable AI / AS or MR/MS    Prior to Admission medications   Medication Sig Start Date End Date Taking? Authorizing Provider  acetaminophen (TYLENOL) 325 MG tablet Take 2 tablets (650 mg  total) by mouth every 6 (six) hours as needed for mild pain or moderate pain. 09/14/22  Yes Arrien, York Ram, MD  apixaban (ELIQUIS) 5 MG TABS tablet TAKE 1 TABLET(5 MG) BY MOUTH TWICE DAILY Patient taking differently: Take 5 mg by mouth 2 (two) times daily. 02/26/23  Yes Ladene Artist, MD  folic acid (FOLVITE) 1 MG tablet Take 1 tablet (1 mg total) by mouth daily. 11/29/22  Yes Rana Snare, NP  LORazepam (ATIVAN) 0.5 MG tablet Take 1 tablet (0.5 mg total) by mouth 2 (two) times daily as  needed for anxiety. 02/14/23  Yes Rana Snare, NP  magnesium oxide (MAG-OX) 400 (240 Mg) MG tablet TAKE 1 TABLET(400 MG) BY MOUTH DAILY Patient taking differently: Take 400 mg by mouth 2 (two) times daily. 01/30/23  Yes Ladene Artist, MD  oxyCODONE (OXY IR/ROXICODONE) 5 MG immediate release tablet Take 1 tablet (5 mg total) by mouth every 6 (six) hours as needed for severe pain. 02/14/23  Yes Rana Snare, NP  pantoprazole (PROTONIX) 40 MG tablet TAKE 1 TABLET(40 MG) BY MOUTH TWICE DAILY BEFORE A MEAL Patient taking differently: Take 40 mg by mouth 2 (two) times daily. 02/28/23  Yes Ladene Artist, MD  potassium chloride (KLOR-CON M) 10 MEQ tablet TAKE 3 TABLETS(30 MEQ) BY MOUTH DAILY Patient taking differently: Take 10 mEq by mouth in the morning, at noon, in the evening, and at bedtime. 02/28/23  Yes Ladene Artist, MD  atenolol (TENORMIN) 25 MG tablet Take 0.5 tablets (12.5 mg total) by mouth 2 (two) times daily. Patient not taking: Reported on 03/07/2023 03/06/23 06/04/23  [place], Eric J, DO  metoprolol tartrate (LOPRESSOR) 100 MG tablet Take 100 mg by mouth 2 (two) times daily. Patient not taking: Reported on 03/07/2023    [provider]    Current Facility-Administered Medications  Medication Dose Route Frequency Provider Last Rate Last Admin   acetaminophen (TYLENOL) tablet 650 mg  650 mg Oral Q6H PRN Reva Bores, MD       [START ON 03/09/2023] cefTRIAXone (ROCEPHIN) 1 g in sodium chloride 0.9 % 100 mL IVPB  1 g Intravenous Q24H Burnadette Pop, MD       Chlorhexidine Gluconate Cloth 2 % PADS 6 each  6 each Topical Daily Burnadette Pop, MD   6 each at 03/08/23 1010   folic acid (FOLVITE) tablet 1 mg  1 mg Oral Daily Reva Bores, MD   1 mg at 03/08/23 1021   LORazepam (ATIVAN) tablet 0.5 mg  0.5 mg Oral BID PRN Reva Bores, MD   0.5 mg at 03/07/23 0207   magnesium oxide (MAG-OX) tablet 400 mg  400 mg Oral BID Reva Bores, MD   400 mg at 03/08/23 1021   meclizine  (ANTIVERT) tablet 12.5 mg  12.5 mg Oral TID PRN Burnadette Pop, MD   12.5 mg at 03/08/23 1155   metoprolol tartrate (LOPRESSOR) injection 5 mg  5 mg Intravenous Q6H PRN Reva Bores, MD       metoprolol tartrate (LOPRESSOR) tablet 12.5 mg  12.5 mg Oral BID Burnadette Pop, MD   12.5 mg at 03/08/23 1021   ondansetron (ZOFRAN) tablet 4 mg  4 mg Oral Q6H PRN Reva Bores, MD       Or   ondansetron [facility]) injection 4 mg  4 mg Intravenous Q6H PRN Reva Bores, MD   4 mg at 03/07/23 0225   oxyCODONE (Oxy IR/ROXICODONE) immediate release  tablet 5 mg  5 mg Oral Q6H PRN Reva Bores, MD       pantoprazole (PROTONIX) EC tablet 40 mg  40 mg Oral BID Reva Bores, MD   40 mg at 03/08/23 1021   polyethylene glycol (MIRALAX / GLYCOLAX) packet 17 g  17 g Oral Daily PRN Reva Bores, MD       potassium chloride SA (KLOR-CON M) CR tablet 20 mEq  20 mEq Oral BID Reva Bores, MD   20 mEq at 03/08/23 1021   tamsulosin (FLOMAX) capsule 0.4 mg  0.4 mg Oral Daily Burnadette Pop, MD   0.4 mg at 03/08/23 1021    Allergies as of 03/06/2023 - Review Complete 03/06/2023  Allergen Reaction Noted   Simvastatin Other (See Comments) 08/01/2022   Zoloft [sertraline] Nausea Only 08/01/2022   Codeine Other (See Comments) 01/03/2011   Coreg Other (See Comments) 12/22/2010   Lisinopril Cough 09/06/2017   Tizanidine hcl Rash and Other (See Comments) 08/01/2022    Family History  Problem Relation Age of Onset   Heart Problems Mother        CABG   Chronic Renal Failure Mother    Heart Problems Father    Hypertension Sister    Cancer Sister        bladder   Migraines Sister    Heart attack Sister    Hypertension Sister        x2   Lupus Brother    Hypertension Brother        and lupus   Heart Problems Maternal Grandmother    Stroke Maternal Grandfather    Cancer Paternal Grandmother        stomach   Heart Problems Paternal Grandfather     Social History   Socioeconomic History   Marital  status: Widowed    Spouse name: Not on file   Number of children: 3   Years of education: hs   Highest education level: Not on file  Occupational History   Occupation: Retired  Tobacco Use   Smoking status: Never   Smokeless tobacco: Never  Vaping Use   Vaping Use: Never used  Substance and Sexual Activity   Alcohol use: Yes    Comment: social    Drug use: No   Sexual activity: Not on file  Other Topics Concern   Not on file  Social History Narrative   She is a widow mother of 2, with 1 child is deceased. She has 2 grandchildren. She is trying to get some walking in town and can do about 20-25 minutes his another 30 minutes she pushes a more night gets short of breath.   She is at retired [employer], he simply laid off after 45 years.   She usually comes accompanied by her sister, who is also my patient.   She never was a smoker, and only occasionally has a social alcoholic beverage.   Social Determinants of Health   Financial Resource Strain: Not on file  Food Insecurity: No Food Insecurity (03/07/2023)   Hunger Vital Sign    Worried About Running Out of Food in the Last Year: Never true    Ran Out of Food in the Last Year: Never true  Transportation Needs: No Transportation Needs (03/07/2023)   PRAPARE - Administrator, Civil Service (Medical): No    Lack of Transportation (Non-Medical): No  Physical Activity: Not on file  Stress: Not on file  Social Connections:  Not on file  Intimate Partner Violence: Not At Risk (03/07/2023)   Humiliation, Afraid, Rape, and Kick questionnaire    Fear of Current or Ex-Partner: No    Emotionally Abused: No    Physically Abused: No    Sexually Abused: No    Review of Systems: As per HPI, all others negative  Physical Exam: Vital signs in last 24 hours: Temp:  [97.8 F (36.6 C)-98.7 F (37.1 C)] 97.8 F (36.6 C) (05/16 0502) Pulse Rate:  [79-98] 97 (05/16 0502) Resp:  [16-18] 16 (05/16 0502) BP:  (86-124)/(51-84) 124/84 (05/16 0502) SpO2:  [87 %-95 %] 91 % (05/16 0502) Last BM Date : 03/08/23 General:   Alert,  overweight, pleasant and cooperative in NAD Head:  Normocephalic and atraumatic. Eyes:  Sclera clear, no icterus.   Conjunctiva pink. Ears:  Normal auditory acuity. Nose:  No deformity, discharge,  or lesions. Mouth:  No deformity or lesions.  Oropharynx pink & moist. Neck:  Supple; no masses or thyromegaly. Lungs:  No respiratory distress Abdomen:  Soft, nontender and nondistended. No masses, hepatosplenomegaly or hernias noted. Normal bowel sounds, without guarding, and without rebound.     Msk:  Symmetrical without gross deformities. Normal posture. Pulses:  Normal pulses noted. Extremities:  Without clubbing or edema. Neurologic:  Alert and  oriented x4; diffusely weak otherwise grossly normal neurologically. Skin:  ecchymoses, otherwise Intact without significant lesions or rashes. Psych:  Alert and cooperative. Normal mood and affect.   Lab Results: Recent Labs    03/06/23 2215 03/07/23 0550 03/08/23 0805  WBC 13.7* 13.0* 9.7  HGB 8.4* 7.8* 8.4*  HCT 27.3* 25.4* 27.0*  PLT 84* 87* 99*   BMET Recent Labs    03/06/23 2215 03/07/23 0550 03/08/23 0805  NA 135 134* 140  K 3.1* 3.1* 3.3*  CL 101 100 105  CO2 26 26 28   GLUCOSE 146* 115* 103*  BUN 9 9 8   CREATININE 1.10* 1.06* 1.10*  CALCIUM 7.5* 7.3* 7.6*   LFT Recent Labs    03/07/23 0550  PROT 5.6*  ALBUMIN 2.2*  AST 16  ALT 9  ALKPHOS 92  BILITOT 0.5   PT/INR Recent Labs    03/05/23 1813  LABPROT 17.9*  INR 1.5*    Studies/Results: MR BRAIN WO CONTRAST  Result Date: 03/07/2023 CLINICAL DATA:  Syncope/presyncope, cerebrovascular cause suspected EXAM: MRI HEAD WITHOUT CONTRAST TECHNIQUE: Multiplanar, multiecho pulse sequences of the brain and surrounding structures were obtained without intravenous contrast. COMPARISON:  None Available. FINDINGS: Brain: Approximately 4 mm thick small  subdural and potentially subarachnoid FLAIR hyperintensity with associated susceptibility artifact, T1 hyperintensity and diffusion restriction, suspicious for recent hemorrhage. Outside of this region, no other areas of restricted diffusion to suggest acute infarct. No midline shift. No hydrocephalus or mass lesion. Patchy T2/FLAIR hyperintensities in the white matter, nonspecific but compatible with chronic microvascular ischemic disease. Possible small remote right parietal cortical infarct. Vascular: Major arterial flow voids are maintained skull base. Skull and upper cervical spine: Normal marrow signal. Sinuses/Orbits: Right maxillary sinus mucosal thickening. No acute orbital findings. Other: Trace right mastoid effusion. IMPRESSION: Findings suspicious for recent 4 mm thick small subdural and potentially subarachnoid hemorrhage along the anterior left falx. No significant mass effect. A noncontrast CT of the head is recommended to further characterize and establish a baseline for follow-up. These results will be called to the ordering clinician or representative by the Radiologist Assistant, and communication documented in the PACS or [employer]. Electronically Signed  By: Feliberto Harts M.D.   On: 03/07/2023 14:29   DG Chest Port 1 View  Result Date: 03/06/2023 CLINICAL DATA:  Syncope. EXAM: PORTABLE CHEST 1 VIEW COMPARISON:  Mar 05, 2023 FINDINGS: The heart size and mediastinal contours are within normal limits. There is moderate severity calcification of the aortic arch. Low lung volumes are noted. Stable mild to moderate severity right middle lobe and right lower lobe Karim, atelectasis and/or infiltrate is seen. No pleural effusion or pneumothorax is identified. Radiopaque surgical clips are seen along the lateral aspect of the right chest wall. The visualized skeletal structures are unremarkable. IMPRESSION: Low lung volumes with stable mild to moderate severity right middle lobe and  right lower lobe atelectasis and/or infiltrate. Electronically Signed   By: Aram Candela M.D.   On: 03/06/2023 22:38    Impression:   Anemia, recurrent, suspect principally from chemotherapy.    Hgb in baseline range. Hemoccult positive stools.  Likely from low platelets and apixaban.  Do not suspect significant GI bleeding. Non-small cell lung cancer, metastatic, on every three week chemotherapy.  Plan:   In absence of overt destabilizing melena or hematochezia, I would not pursue endoscopy or other GI evaluation. Continue PPI indefinitely, so long as she is on chemotherapy and apixaban. OK to resume apixaban from GI perspective, but possibly might need to be transiently held given her subarachoid/subdural hematoma; defer to neurosurgery/primary team. Transfuse, volume repletion as needed. Diet as tolerated. [facility] will sign-off; please call with questions; thank you for the consultation.   LOS: 1 day   Samon Dishner M  03/08/2023, 12:01 PM  Cell [phone] If no answer or after 5 PM call [phone]
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
## 2023-03-08 NOTE — Evaluation (Addendum)
Occupational Therapy Evaluation Patient Details Name: Crystal [name] MRN: [id number] DOB: [birth year] Today's Date: 03/08/2023   History of Present Illness  (Crystal [name] is a 77 yr old female discharged from the hospital on 03-06-23 due to dizziness & palpitations, however she returned to the hospital the same day due to persistent  dizziness and a fall. A MRI of the brain revealed suspicion for a recent 4 mm thick small subdural and potentially SAH along the anterior falx. PMH: non-Hodgkin's lymphoma, metastatic non-small cell lung cancer getting current treatment, hypertension, CKD stage IIIa,  DVT/PE    Clinical Impression   The pt is currently presenting below her baseline level of functioning for self-care management, as she is limited by acute dizziness with lightheadedness; her symptoms worsen with activity, and she describes it as the "room spinning." She also reports acute mild blurriness of both eyes. Subsequently, she presents with impaired ADL performance and unsteadiness in dynamic standing. She has been receiving home health therapy & she desires to return home with such services once she discharges from the hospital. At current, she would be a high falls risk and would need consistent in home supervision and assistance. If her symptoms improve, OT anticipates she would be okay to return home with her family at discharge.      Recommendations for follow up therapy are one component of a multi-disciplinary discharge planning process, led by the attending physician.  Recommendations may be updated based on patient status, additional functional criteria and insurance authorization.   Assistance Recommended at Discharge Intermittent Supervision/Assistance  Patient can return home with the following Assist for transportation;Assistance with cooking/housework;Help with stairs or ramp for entrance;A lot of help with bathing/dressing/bathroom    Functional Status Assessment  Patient has had a  recent decline in their functional status and demonstrates the ability to make significant improvements in function in a reasonable and predictable amount of time.  Equipment Recommendations  None recommended by OT       Precautions / Restrictions Precautions Precautions: Fall Restrictions Weight Bearing Restrictions: No Other Position/Activity Restrictions: dizziness with activity      Mobility Bed Mobility Overal bed mobility: Needs Assistance Bed Mobility: Sit to Supine     Supine to sit: Supervision          Transfers Overall transfer level: Needs assistance Equipment used: Rolling walker (2 wheels) Transfers: Sit to/from Stand, Bed to chair/wheelchair/BSC Sit to Stand: Min assist, Mod assist Stand pivot transfers: Min assist                Balance       Sitting balance - Comments: static sitting-good, dynamic sitting-fair (limited by dizziness)     Standing balance-Leahy Scale: Poor                ADL either performed or assessed with clinical judgement   ADL Overall ADL's : Needs assistance/impaired Eating/Feeding: Independent;Sitting   Grooming: Set up;Sitting           Upper Body Dressing : Set up;Sitting   Lower Body Dressing: Moderate assistance;Sit to/from stand              Vision Patient Visual Report: Blurring of vision Additional Comments: she reported having mild acute blurry vision, since the onset of her symptoms            Pertinent Vitals/Pain Pain Assessment Pain Assessment: No/denies pain     Hand Dominance Right   Extremity/Trunk Assessment Upper Extremity Assessment Upper Extremity Assessment: Overall  WFL for tasks assessed   Lower Extremity Assessment Lower Extremity Assessment: Overall WFL for tasks assessed  RLE Sensation:  (she denied having numbness and tingling of her BUE and BLE)  LLE Sensation:  (she denied having numbness and tingling of her BUE and BLE)      Communication  Communication Communication: No difficulties   Cognition Arousal/Alertness: Awake/alert Behavior During Therapy: WFL for tasks assessed/performed Overall Cognitive Status: Within Functional Limits for tasks assessed            General Comments: Oriented x4, able to follow commands                Home Living Family/patient expects to be discharged to:: Private residence Living Arrangements: Other relatives (Sister) Available Help at Discharge: Family Type of Home: House Home Access: Level entry     Home Layout: One level     Bathroom Shower/Tub: Walk-in shower;Tub/shower unit   Bathroom Toilet: Standard     Home Equipment: Rollator (4 wheels);Shower seat - built in;Tub bench;Cane - single point;BSC/3in1          Prior Functioning/Environment Prior Level of Function : Independent/Modified Independent       Physical Assist : Mobility (physical)     Mobility Comments:  (She furniture walks vs. uses a RW for household ambulation.) She has been receiving home health PT. ADLs Comments:  (She was independent with ADLs, except for her sister providing min assist for bathing. The pt hasn't driven since November 2023.)        OT Problem List: Impaired balance (sitting and/or standing);Decreased activity tolerance;Impaired vision/perception      OT Treatment/Interventions: Self-care/ADL training;Therapeutic exercise;Energy conservation;DME and/or AE instruction;Therapeutic activities;Balance training;Patient/family education;Visual/perceptual remediation/compensation    OT Goals(Current goals can be found in the care plan section) Acute Rehab OT Goals Patient Stated Goal: improvement in symptoms and to return home at discharge OT Goal Formulation: With patient Time For Goal Achievement: 03/22/23 Potential to Achieve Goals: Good ADL Goals Pt Will Perform Grooming: with supervision;standing Pt Will Perform Lower Body Dressing: with supervision;sit to/from stand Pt  Will Transfer to Toilet: with supervision;ambulating Pt Will Perform Toileting - Clothing Manipulation and hygiene: with supervision;sit to/from stand  OT Frequency: Min 1X/week       AM-PAC OT "6 Clicks" Daily Activity     Outcome Measure Help from another person eating meals?: None Help from another person taking care of personal grooming?: None Help from another person toileting, which includes using toliet, bedpan, or urinal?: A Lot Help from another person bathing (including washing, rinsing, drying)?: A Lot Help from another person to put on and taking off regular upper body clothing?: None Help from another person to put on and taking off regular lower body clothing?: A Lot 6 Click Score: 18   End of Session Equipment Utilized During Treatment: Rolling walker (2 wheels) Nurse Communication: Mobility status  Activity Tolerance: Other (comment) (Limited by dizziness with activity) Patient left: in bed;with call bell/phone within reach;with bed alarm set  OT Visit Diagnosis: Unsteadiness on feet (R26.81);Dizziness and giddiness (R42)                Time: 1640-1701 OT Time Calculation (min): 21 min Charges:  OT General Charges $OT Visit: 1 Visit OT Evaluation $OT Eval Moderate Complexity: 1 Mod    Tomika Eckles L Yasseen Salls, OTR/L 03/08/2023, 5:28 PM

## 2023-03-08 NOTE — Evaluation (Signed)
Physical Therapy Evaluation Patient Details Name: Crystal [name] MRN: [id number] DOB: 02-Dec-1945 Today's Date: 03/08/2023  History of Present Illness  Patient is a 77 year old female with history of non-Hodgkin's lymphoma, metastatic non-small cell lung cancer, hypertension, CKD stage IIIa, history of DVT/PE on Eliquis who was just discharged on 5/14 from here presented again 03/06/23  with a fall.MRI of the brain showed 4 mm subdural/subarachnoid hemorrhage in the anterior left falx.  Neurosurgery  did not recommend any intervention.  Clinical Impression  03/08/2023 Pt admitted with above diagnosis.  Pt currently with functional limitations due to the deficits listed below (see PT Problem List). Pt will benefit from acute skilled PT to increase their independence and safety with mobility to allow discharge.    The patient presents with imbalance and required mod assistance  for standing and transfers to recliner and BSC. Patient reports feeling dizzy which improves when sitting still. Unable to ambulate.  Patient has  had medication for vertigo today of which  patient has a remote history of as well as patient hit her head in the fall.  The patient's sister has been living with patient  for several months, patient uses a RW at baseline.  Patient  Orthostatic BP- sup 121/72,  sitting 92/58, standing 135/63, after transfer 111/63. HR 108-117.   Recommendations for follow up therapy are one component of a multi-disciplinary discharge planning process, led by the attending physician.  Recommendations may be updated based on patient status, additional functional criteria and insurance authorization.  Follow Up Recommendations       Assistance Recommended at Discharge Frequent or constant Supervision/Assistance  Patient can return home with the following  Two people to help with walking and/or transfers;A lot of help with bathing/dressing/bathroom;Assist for transportation;Assistance with  cooking/housework;Help with stairs or ramp for entrance    Equipment Recommendations None recommended by PT  Recommendations for Other Services       Functional Status Assessment Patient has had a recent decline in their functional status and demonstrates the ability to make significant improvements in function in a reasonable and predictable amount of time.     Precautions / Restrictions Precautions Precautions: Fall      Mobility  Bed Mobility Overal bed mobility: Needs Assistance Bed Mobility: Supine to Sit     Supine to sit: Min assist     General bed mobility comments: patient clingy and reports dizziness, holding to rail, therapist, bed, very guarded    Transfers Overall transfer level: Needs assistance Equipment used: Rolling walker (2 wheels) Transfers: Sit to/from Stand, Bed to chair/wheelchair/BSC Sit to Stand: Mod assist   Step pivot transfers: Mod assist       General transfer comment: wide base,  difficulty to Weight shift,  Held to RW to Recliner, pivot to  and from [facility] with HHA and  held armrest. VEry guarded.    Ambulation/Gait               General Gait Details: NT  Stairs            Wheelchair Mobility    Modified Rankin (Stroke Patients Only)       Balance Overall balance assessment: Needs assistance, History of Falls Sitting-balance support: Bilateral upper extremity supported, Feet supported Sitting balance-Leahy Scale: Fair Sitting balance - Comments: guarded and holds tightly   Standing balance support: Bilateral upper extremity supported, During functional activity, Reliant on assistive device for balance Standing balance-Leahy Scale: Poor  Pertinent Vitals/Pain Pain Assessment Pain Assessment: Faces Faces Pain Scale: Hurts little more Pain Location: head Pain Descriptors / Indicators: Aching Pain Intervention(s): Monitored during session    Home Living Family/patient  expects to be discharged to:: Private residence Living Arrangements: Other relatives Available Help at Discharge: Family;Available 24 hours/day Type of Home: House Home Access: Level entry       Home Layout: One level Home Equipment: Rollator (4 wheels);Shower seat - built in;Hand held [employer]      Prior Function Prior Level of Function : Driving;Needs assist       Physical Assist : Mobility (physical)     Mobility Comments: uses RW ADLs Comments: Sister assists  with bath     Hand Dominance   Dominant Hand: Right    Extremity/Trunk Assessment   Upper Extremity Assessment Upper Extremity Assessment: Overall WFL for tasks assessed    Lower Extremity Assessment Lower Extremity Assessment: RLE deficits/detail;LLE deficits/detail RLE Deficits / Details: tremors when standing, grossly  WFL, LLE Deficits / Details: same as right    Cervical / Trunk Assessment Cervical / Trunk Assessment: Normal  Communication   Communication: No difficulties  Cognition Arousal/Alertness: Awake/alert Behavior During Therapy: WFL for tasks assessed/performed Overall Cognitive Status: Within Functional Limits for tasks assessed                                          General Comments      Exercises     Assessment/Plan    PT Assessment Patient needs continued PT services  PT Problem List Decreased activity tolerance;Decreased mobility;Cardiopulmonary status limiting activity;Decreased balance;Pain       PT Treatment Interventions DME instruction;Therapeutic activities;Balance training;Gait training;Functional mobility training;Therapeutic exercise;Patient/family education    PT Goals (Current goals can be found in the Care Plan section)  Acute Rehab PT Goals Patient Stated Goal: to go home PT Goal Formulation: With patient/family Time For Goal Achievement: 03/22/23 Potential to Achieve Goals: Good    Frequency Min 1X/week      Co-evaluation               AM-PAC PT "6 Clicks" Mobility  Outcome Measure Help needed turning from your back to your side while in a flat bed without using bedrails?: A Little Help needed moving from lying on your back to sitting on the side of a flat bed without using bedrails?: A Little Help needed moving to and from a bed to a chair (including a wheelchair)?: A Lot Help needed standing up from a chair using your arms (e.g., wheelchair or bedside chair)?: A Lot Help needed to walk in hospital room?: Total Help needed climbing 3-5 steps with a railing? : Total 6 Click Score: 12    End of Session Equipment Utilized During Treatment: Gait belt Activity Tolerance: Treatment limited secondary to medical complications (Comment) Patient left: in chair;with call bell/phone within reach;with chair alarm set;with family/visitor present Nurse Communication: Mobility status PT Visit Diagnosis: Unsteadiness on feet (R26.81);History of falling (Z91.81);Difficulty in walking, not elsewhere classified (R26.2);Dizziness and giddiness (R42)    Time: 1610-9604 PT Time Calculation (min) (ACUTE ONLY): 25 min   Charges:   PT Evaluation $PT Eval Low Complexity: 1 Low PT Treatments $Gait Training: 8-22 mins        Blanchard Kelch PT Acute Rehabilitation Services Office [phone] Weekend pager-[phone]   Rada Hay 03/08/2023, 4:44 PM

## 2023-03-09 DIAGNOSIS — R42 Dizziness and giddiness: Secondary | ICD-10-CM | POA: Diagnosis not present

## 2023-03-09 DIAGNOSIS — R55 Syncope and collapse: Secondary | ICD-10-CM | POA: Diagnosis not present

## 2023-03-09 LAB — BASIC METABOLIC PANEL
Anion gap: 5 (ref 5–15)
BUN: 7 mg/dL — ABNORMAL LOW (ref 8–23)
CO2: 29 mmol/L (ref 22–32)
Calcium: 7.6 mg/dL — ABNORMAL LOW (ref 8.9–10.3)
Chloride: 106 mmol/L (ref 98–111)
Creatinine, Ser: 1.05 mg/dL — ABNORMAL HIGH (ref 0.44–1.00)
GFR, Estimated: 55 mL/min — ABNORMAL LOW (ref >60)
Glucose, Bld: 96 mg/dL (ref 70–99)
Potassium: 3.5 mmol/L (ref 3.5–5.1)
Sodium: 140 mmol/L (ref 135–145)

## 2023-03-09 LAB — CBC
HCT: 26.1 % — ABNORMAL LOW (ref 36.0–46.0)
Hemoglobin: 8.1 g/dL — ABNORMAL LOW (ref 12.0–15.0)
MCH: 31.4 pg (ref 26.0–34.0)
MCHC: 31 g/dL (ref 30.0–36.0)
MCV: 101.2 fL — ABNORMAL HIGH (ref 80.0–100.0)
Platelets: 102 10*3/uL — ABNORMAL LOW (ref 150–400)
RBC: 2.58 MIL/uL — ABNORMAL LOW (ref 3.87–5.11)
RDW: 20 % — ABNORMAL HIGH (ref 11.5–15.5)
WBC: 8.2 10*3/uL (ref 4.0–10.5)
nRBC: 0 % (ref 0.0–0.2)

## 2023-03-09 LAB — CULTURE, BLOOD (ROUTINE X 2)

## 2023-03-09 MED ORDER — MECLIZINE HCL 25 MG PO TABS
25.0000 mg | ORAL_TABLET | Freq: Three times a day (TID) | ORAL | Status: DC
  Administered 2023-03-09 – 2023-03-13 (×11): 25 mg via ORAL
  Filled 2023-03-09 (×11): qty 1

## 2023-03-09 MED ORDER — FERROUS SULFATE 325 (65 FE) MG PO TABS
325.0000 mg | ORAL_TABLET | Freq: Every day | ORAL | Status: DC
  Administered 2023-03-10 – 2023-03-21 (×12): 325 mg via ORAL
  Filled 2023-03-09 (×11): qty 1

## 2023-03-09 MED ORDER — SODIUM CHLORIDE 0.9 % IV SOLN: INTRAVENOUS | Status: DC

## 2023-03-09 MED ORDER — METOPROLOL TARTRATE 25 MG PO TABS: 25.0000 mg | ORAL_TABLET | Freq: Two times a day (BID) | ORAL | Status: DC

## 2023-03-09 MED ORDER — METOPROLOL SUCCINATE ER 25 MG PO TB24
25.0000 mg | ORAL_TABLET | Freq: Every day | ORAL | Status: DC
  Administered 2023-03-09 – 2023-03-10 (×2): 25 mg via ORAL
  Filled 2023-03-09 (×2): qty 1
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
## 2023-03-09 NOTE — Progress Notes (Signed)
Pt has not urinated since foley discontinued.  Bladder scan read 90ml. Notified MD. Will continue to monitor.

## 2023-03-09 NOTE — Progress Notes (Addendum)
Physical Therapy Treatment Patient Details Name: Crystal [name] MRN: [id number] DOB: 23-Mar-1946 Today's Date: 03/09/2023   History of Present Illness Patient is a 77 year old female with history of non-Hodgkin's lymphoma, metastatic non-small cell lung cancer, hypertension, CKD stage IIIa, history of DVT/PE on Eliquis who was just discharged on 5/14 from here presented again 03/06/23  with a fall.MRI of the brain showed 4 mm subdural/subarachnoid hemorrhage in the anterior left falx.  Neurosurgery  did not recommend any intervention.    PT Comments    Pt with improved mobility but limited by orthostatic hypotension.  Pt had lightheadedness with drop in BP with sitting and standing.  She did improve with time and second attempt and was able to  walk 30' but unsteady, fatigued easily, requiring chair follow.  HR up to 130's with activity. In addition to lightheadedness and orthostatic hypotension pt with dizzy/spinning upon return to supine and rolling.  Pt + for BPPV bil posterior canal (L worse than R). Also, suspect SDH effecting. She is motivated to return home.  However at this time would recommend further post-acute rehab, but hopeful can improve to home level as orthostatic BP improves and BPPV treated.  Notified RN and MD +BPPV and + orthostatic hypotension.   Recommendations for follow up therapy are one component of a multi-disciplinary discharge planning process, led by the attending physician.  Recommendations may be updated based on patient status, additional functional criteria and insurance authorization.  Follow Up Recommendations  Can patient physically be transported by private vehicle: Yes    Assistance Recommended at Discharge Frequent or constant Supervision/Assistance  Patient can return home with the following Assist for transportation;Assistance with cooking/housework;Help with stairs or ramp for entrance;A little help with walking and/or transfers;A little help with  bathing/dressing/bathroom   Equipment Recommendations  None recommended by PT    Recommendations for Other Services       Precautions / Restrictions Precautions Precautions: Fall Precaution Comments: orthostatic bp     Mobility  Bed Mobility Overal bed mobility: Needs Assistance Bed Mobility: Rolling, Supine to Sit, Sit to Supine Rolling: Min guard   Supine to sit: Min guard Sit to supine: Min guard   General bed mobility comments: minguard for safety; lightheaded upon sitting and dizzy upon return to bed    Transfers Overall transfer level: Needs assistance Equipment used: Rolling walker (2 wheels) Transfers: Sit to/from Stand, Bed to chair/wheelchair/BSC Sit to Stand: Min assist Stand pivot transfers: Min assist         General transfer comment: Performed x 4 during session.  Pt shakey requiring min A to stablize.  Had lightheadedness wihen first standing but improved with walking.    Ambulation/Gait Ambulation/Gait assistance: Min assist, +2 safety/equipment Gait Distance (Feet): 30 Feet Assistive device: Rolling walker (2 wheels) Gait Pattern/deviations: Step-through pattern, Decreased stride length Gait velocity: fast     General Gait Details: Initially, lightheaded with standing andr return to sitting.  Pt seemed disappointed and fearful of not being able to return home, so attempted again with improved symptoms and was able to walk.Pt with faster gait with forward momentum requiring assist for RW control and chair follow for safety.   Stairs             Wheelchair Mobility    Modified Rankin (Stroke Patients Only)       Balance Overall balance assessment: Needs assistance, History of Falls Sitting-balance support: No upper extremity supported Sitting balance-Leahy Scale: Fair     Standing balance support:  Bilateral upper extremity supported, Reliant on assistive device for balance Standing balance-Leahy Scale: Poor                               Cognition Arousal/Alertness: Awake/alert Behavior During Therapy: WFL for tasks assessed/performed Overall Cognitive Status: Within Functional Limits for tasks assessed                                 General Comments: cooperative and motivated; emotional at times regarding medical condition        Exercises      General Comments  Orthostatic Testing:   Supine 114/67 HR 100 Sitting 97/73 HR 110's , pt initially lightheaded Sitting 3 mins 116/79, symptoms improved Standing 83/63 HR 130's, initial lightheadedness and legs trembling Unable to stand 3 mins.  BP up to 116/68 upon sitting with HR 110's Pt wanting to try walking.  Improved symptoms upon second stand, began ambulation for improve circulation but had close chair follow.  Pt able to walk 30' with BP 115/68 upon return to sitting.  Vestibular: History: Reports the lightheadedness with standing but spinning with return to supine.  States has not had spinning until after her fall. Spinning sensations only lasting a few seconds.    Smooth Pursuit: Normal Gaze Stabilization: Normal, mild dizziness Head Thrust:Normal [facility] Left: + with right upward rotation nystagmus lasting ~20 seconds, treated with Epley Maneuvar [employer] Right: + with right upward rotation nystagmus (not as intense, ~15 seconds) treated with Epley Maneuvar Educated on BPPV and provided handout.      Pertinent Vitals/Pain Pain Assessment Pain Assessment: Faces Faces Pain Scale: Hurts a little bit Pain Location: Neck muscles sore Pain Descriptors / Indicators: Sore Pain Intervention(s): Limited activity within patient's tolerance, Monitored during session    Home Living                          Prior Function            PT Goals (current goals can now be found in the care plan section) Acute Rehab PT Goals Patient Stated Goal: to go home Progress towards PT goals: Progressing toward  goals    Frequency    Min 1X/week      PT Plan Current plan remains appropriate    Co-evaluation              AM-PAC PT "6 Clicks" Mobility   Outcome Measure  Help needed turning from your back to your side while in a flat bed without using bedrails?: A Little Help needed moving from lying on your back to sitting on the side of a flat bed without using bedrails?: A Little Help needed moving to and from a bed to a chair (including a wheelchair)?: A Lot Help needed standing up from a chair using your arms (e.g., wheelchair or bedside chair)?: A Lot Help needed to walk in hospital room?: A Lot Help needed climbing 3-5 steps with a railing? : Total 6 Click Score: 13    End of Session Equipment Utilized During Treatment: Gait belt Activity Tolerance: Patient tolerated treatment well Patient left: in bed;with call bell/phone within reach;with bed alarm set;with family/visitor present Nurse Communication: Mobility status PT Visit Diagnosis: Unsteadiness on feet (R26.81);History of falling (Z91.81);Difficulty in walking, not elsewhere classified (R26.2);Dizziness and giddiness (R42)  Time: 1610-9604 PT Time Calculation (min) (ACUTE ONLY): 40 min  Charges:  $Gait Training: 8-22 mins $Therapeutic Activity: 8-22 mins $Canalith Rep Proc: 8-22 mins                     Anise Salvo, PT Acute Rehab [facility] [phone]    Rayetta Humphrey 03/09/2023, 3:56 PM

## 2023-03-09 NOTE — TOC Initial Note (Signed)
Transition of Care [facility]) - Initial/Assessment Note    Patient Details  Name: Crystal [name] MRN: [id number] Date of Birth: 05/30/1946  Transition of Care [facility]) CM/SW Contact:    Beckie Busing, RN Phone Number:[phone]  03/09/2023, 12:54 PM  Clinical Narrative:                 TOC following patient with high risk for readmission. Patient is from home where she normally functions with a little assistance. Patient states that she does have a PCP Blair Heys, MD and she follows up on a regular basis. Patient confirms that she does have access to medications and that medications are affordable. Patient states that she does have a walker at home and does not need any other DME. Cm made patient aware of current [facility] PT recommendation. Patient states that she already has HH PT coming out to her home a few days per week and PCS. Patient can not recall name of agency and states that she can't get the information until she gets home but she definitely has PT and does not want CM to set up any home health services. TOC will continue to follow.  Expected Discharge Plan: Home w Home Health Services Barriers to Discharge: Continued Medical Work up   Patient Goals and CMS Choice Patient states their goals for this hospitalization and ongoing recovery are:: Wants to get better to go home CMS Medicare.gov Compare Post Acute Care list provided to:: Patient Choice offered to / list presented to : Patient [employer] ownership interest in [facility].provided to:: Patient    Expected Discharge Plan and Services In-house Referral: NA Discharge Planning Services: CM Consult Post Acute Care Choice: NA Living arrangements for the past 2 months: Single Family Home                 DME Arranged: N/A         HH Arranged: Refused HH          Prior Living Arrangements/Services Living arrangements for the past 2 months: Single Family Home Lives with:: Siblings Patient language and need for  interpreter reviewed:: Yes Do you feel safe going back to the place where you live?: Yes          Current home services: Other (comment) (n/a) Criminal Activity/Legal Involvement Pertinent to Current Situation/Hospitalization: No - Comment as needed  Activities of Daily Living Home Assistive Devices/Equipment: Walker (specify type) ADL Screening (condition at time of admission) Patient's cognitive ability adequate to safely complete daily activities?: No (sister assists) Is the patient deaf or have difficulty hearing?: No Does the patient have difficulty seeing, even when wearing glasses/contacts?: No Does the patient have difficulty concentrating, remembering, or making decisions?: No Patient able to express need for assistance with ADLs?: Yes Does the patient have difficulty dressing or bathing?: No Independently performs ADLs?: No Communication: Independent Is this a change from baseline?: Pre-admission baseline Dressing (OT): Needs assistance Is this a change from baseline?: Pre-admission baseline Grooming: Needs assistance Is this a change from baseline?: Pre-admission baseline Feeding: Independent Bathing: Needs assistance Is this a change from baseline?: Pre-admission baseline Toileting: Independent In/Out Bed: Needs assistance Is this a change from baseline?: Pre-admission baseline Walks in Home: Independent Does the patient have difficulty walking or climbing stairs?: No Weakness of Legs: Both Weakness of Arms/Hands: Both  Permission Sought/Granted Permission sought to share information with : Family Supports Permission granted to share information with : No  Emotional Assessment Appearance:: Appears stated age Attitude/Demeanor/Rapport: Gracious Affect (typically observed): Pleasant Orientation: : Oriented to Self, Oriented to Place, Oriented to Situation, Oriented to  Time Alcohol / Substance Use: Not Applicable Psych Involvement: No  (comment)  Admission diagnosis:  Palpitations [R00.2] Weakness [R53.1] Postural dizziness with presyncope [R42, R55] Patient Active Problem List   Diagnosis Date Noted   Postural dizziness with presyncope 03/07/2023   Frequent PVCs 03/07/2023   Thrombocytopenia (HCC) 03/07/2023   Anemia associated with chemotherapy 03/07/2023   UTI (urinary tract infection) 03/07/2023   Sinus tachycardia 03/06/2023   History of DVT (deep vein thrombosis) 03/06/2023   DNR (do not resuscitate)/DNI(Do Not Intubate) 03/06/2023   Pleural effusion, malignant 01/13/2023   Neutropenia (HCC) 12/08/2022   Recurrent right pleural effusion 11/16/2022   Acute pulmonary embolism (HCC) 11/12/2022   Acute hypoxemic respiratory failure (HCC) 11/12/2022   Metastatic non-small cell lung cancer (HCC) 11/12/2022   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   Rib fracture 11/12/2022   Anxiety 11/12/2022   CKD stage 3a, GFR 45-59 ml/min (HCC) - baseline SCr 0.9-1.1 11/12/2022   Chronic HFrEF (heart failure with reduced ejection fraction) (HCC) 11/12/2022   Metastasis to spinal column (HCC) 10/03/2022   Primary cancer of right middle lobe of lung (HCC) 09/29/2022   Intracranial bleed (HCC) 09/13/2022   Lung mass 09/13/2022   Hypokalemia 09/13/2022   Tachycardia 08/29/2021   Preop cardiovascular exam 08/29/2020   Symptomatic cholelithiasis 06/09/2020   S/P laparoscopic cholecystectomy 06/09/2020   Chest pain with moderate risk for cardiac etiology 02/14/2018   Right upper quadrant pain 02/12/2018   Tremor 02/11/2014   Non Hodgkin's lymphoma (HCC) 08/18/2013   Neck pain on left side 05/30/2013   Dizziness 05/30/2013   Moderate obesity 05/21/2013   Lung crackles 05/21/2013   Dyspnea on exertion 05/21/2013   Nonischemic cardiomyopathy (HCC)    Essential hypertension    Dyslipidemia, goal LDL below 100    PCP:  Blair Heys, MD Pharmacy:   [id number] [place], [place] - [address] AT [facility]  OF [facility] & [address] [place] [address] Phone: [phone] Fax: [phone]     Social Determinants of Health (SDOH) Social History: SDOH Screenings   Food Insecurity: No Food Insecurity (03/07/2023)  Housing: Low Risk  (03/07/2023)  Transportation Needs: No Transportation Needs (03/07/2023)  Utilities: Not At Risk (03/07/2023)  Depression (PHQ2-9): Low Risk  (03/02/2023)  Tobacco Use: Low Risk  (03/06/2023)   SDOH Interventions:     Readmission Risk Interventions    03/09/2023   12:06 PM  Readmission Risk Prevention Plan  Transportation Screening Complete  Medication Review (RN Care Manager) Complete  PCP or Specialist appointment within 3-5 days of discharge Complete  HRI or Home Care Consult Complete  SW Recovery Care/Counseling Consult Complete  Palliative Care Screening Not Applicable  Skilled Nursing Facility Not Applicable

## 2023-03-10 DIAGNOSIS — R42 Dizziness and giddiness: Secondary | ICD-10-CM | POA: Diagnosis not present

## 2023-03-10 DIAGNOSIS — R55 Syncope and collapse: Secondary | ICD-10-CM | POA: Diagnosis not present

## 2023-03-10 LAB — BASIC METABOLIC PANEL
Anion gap: 6 (ref 5–15)
BUN: 7 mg/dL — ABNORMAL LOW (ref 8–23)
CO2: 25 mmol/L (ref 22–32)
Calcium: 6.4 mg/dL — CL (ref 8.9–10.3)
Chloride: 108 mmol/L (ref 98–111)
Creatinine, Ser: 0.88 mg/dL (ref 0.44–1.00)
GFR, Estimated: >60 mL/min (ref >60)
Glucose, Bld: 84 mg/dL (ref 70–99)
Potassium: 2.8 mmol/L — ABNORMAL LOW (ref 3.5–5.1)
Sodium: 139 mmol/L (ref 135–145)

## 2023-03-10 LAB — CBC
HCT: 23.2 % — ABNORMAL LOW (ref 36.0–46.0)
Hemoglobin: 7.1 g/dL — ABNORMAL LOW (ref 12.0–15.0)
MCH: 31.4 pg (ref 26.0–34.0)
MCHC: 30.6 g/dL (ref 30.0–36.0)
MCV: 102.7 fL — ABNORMAL HIGH (ref 80.0–100.0)
Platelets: 111 10*3/uL — ABNORMAL LOW (ref 150–400)
RBC: 2.26 MIL/uL — ABNORMAL LOW (ref 3.87–5.11)
RDW: 19.9 % — ABNORMAL HIGH (ref 11.5–15.5)
WBC: 7.2 10*3/uL (ref 4.0–10.5)
nRBC: 0 % (ref 0.0–0.2)

## 2023-03-10 LAB — CULTURE, BLOOD (ROUTINE X 2): Special Requests: ADEQUATE

## 2023-03-10 LAB — PREPARE RBC (CROSSMATCH)

## 2023-03-10 LAB — TYPE AND SCREEN

## 2023-03-10 MED ORDER — POTASSIUM CHLORIDE CRYS ER 20 MEQ PO TBCR
40.0000 meq | EXTENDED_RELEASE_TABLET | ORAL | Status: AC
  Administered 2023-03-10 (×2): 40 meq via ORAL
  Filled 2023-03-10: qty 2

## 2023-03-10 MED ORDER — SODIUM CHLORIDE 0.9% FLUSH: 10.0000 mL | INTRAVENOUS | Status: DC | PRN

## 2023-03-10 MED ORDER — CALCIUM GLUCONATE-NACL 1-0.675 GM/50ML-% IV SOLN
1.0000 g | Freq: Once | INTRAVENOUS | Status: AC
  Administered 2023-03-10: 1000 mg via INTRAVENOUS
  Filled 2023-03-10: qty 50

## 2023-03-10 MED ORDER — SODIUM CHLORIDE 0.9% FLUSH
10.0000 mL | Freq: Two times a day (BID) | INTRAVENOUS | Status: DC
  Administered 2023-03-10 – 2023-03-21 (×21): 10 mL

## 2023-03-10 MED ORDER — SODIUM CHLORIDE 0.9% IV SOLUTION: Freq: Once | INTRAVENOUS | Status: AC

## 2023-03-10 MED ORDER — ALTEPLASE 2 MG IJ SOLR
2.0000 mg | Freq: Once | INTRAMUSCULAR | Status: DC
  Filled 2023-03-10: qty 2
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
## 2023-03-11 DIAGNOSIS — R55 Syncope and collapse: Secondary | ICD-10-CM | POA: Diagnosis not present

## 2023-03-11 DIAGNOSIS — R42 Dizziness and giddiness: Secondary | ICD-10-CM | POA: Diagnosis not present

## 2023-03-11 LAB — BASIC METABOLIC PANEL
Anion gap: 6 (ref 5–15)
BUN: 7 mg/dL — ABNORMAL LOW (ref 8–23)
CO2: 28 mmol/L (ref 22–32)
Calcium: 7.3 mg/dL — ABNORMAL LOW (ref 8.9–10.3)
Chloride: 105 mmol/L (ref 98–111)
Creatinine, Ser: 1.01 mg/dL — ABNORMAL HIGH (ref 0.44–1.00)
GFR, Estimated: 57 mL/min — ABNORMAL LOW (ref >60)
Glucose, Bld: 91 mg/dL (ref 70–99)
Potassium: 3.7 mmol/L (ref 3.5–5.1)
Sodium: 139 mmol/L (ref 135–145)

## 2023-03-11 LAB — CBC
HCT: 28.8 % — ABNORMAL LOW (ref 36.0–46.0)
Hemoglobin: 8.9 g/dL — ABNORMAL LOW (ref 12.0–15.0)
MCH: 30.9 pg (ref 26.0–34.0)
MCHC: 30.9 g/dL (ref 30.0–36.0)
MCV: 100 fL (ref 80.0–100.0)
Platelets: 118 10*3/uL — ABNORMAL LOW (ref 150–400)
RBC: 2.88 MIL/uL — ABNORMAL LOW (ref 3.87–5.11)
RDW: 19.9 % — ABNORMAL HIGH (ref 11.5–15.5)
WBC: 7 10*3/uL (ref 4.0–10.5)
nRBC: 0 % (ref 0.0–0.2)

## 2023-03-11 MED ORDER — METOPROLOL SUCCINATE ER 50 MG PO TB24
50.0000 mg | ORAL_TABLET | Freq: Every day | ORAL | Status: DC
  Administered 2023-03-11: 50 mg via ORAL
  Filled 2023-03-11: qty 1

## 2023-03-11 NOTE — Progress Notes (Signed)
Physical Therapy Treatment Patient Details Name: Crystal [name] MRN: [id number] DOB: [birth year] Today's Date: 03/11/2023   History of Present Illness Patient is a 77 year old female with history of non-Hodgkin's lymphoma, metastatic non-small cell lung cancer, hypertension, CKD stage IIIa, history of DVT/PE on Eliquis who was just discharged on 5/14 from here presented again 03/06/23  with a fall. MRI of the brain showed 4 mm subdural/subarachnoid hemorrhage in the anterior left falx.  Neurosurgery did not recommend any intervention.    PT Comments    Pt reports feeling better today and agreeable for positional testing again. Pt again positive for upbeating rotary nystagmus on left AND right Dix-Hallpike, however pt reports symptoms worse on right and also observed stronger nystagmus on right.  So only performed canalith repositioning technique (Epley's) for Right side today.  Pt then assisted with ambulating however only tolerated short distance, limited by fatigue and dizziness with HR elevating to 140s.  Pt's sister arrived end of session and likely cannot provide physical assist pt may need at home, so pt may benefit from post acute rehab upon d/c.    Recommendations for follow up therapy are one component of a multi-disciplinary discharge planning process, led by the attending physician.  Recommendations may be updated based on patient status, additional functional criteria and insurance authorization.  Follow Up Recommendations  Can patient physically be transported by private vehicle: Yes    Assistance Recommended at Discharge Frequent or constant Supervision/Assistance  Patient can return home with the following Assist for transportation;Assistance with cooking/housework;Help with stairs or ramp for entrance;A little help with walking and/or transfers;A little help with bathing/dressing/bathroom   Equipment Recommendations  None recommended by PT    Recommendations for Other Services        Precautions / Restrictions Precautions Precautions: Fall Precaution Comments: monitor HR     Mobility  Bed Mobility Overal bed mobility: Needs Assistance Bed Mobility: Supine to Sit, Sit to Supine     Supine to sit: Min guard, HOB elevated Sit to supine: Min guard, HOB elevated        Transfers Overall transfer level: Needs assistance Equipment used: Rolling walker (2 wheels) Transfers: Sit to/from Stand Sit to Stand: Min assist           General transfer comment: assist to stabilize with rise and control descent, cues for hand placement to self assist    Ambulation/Gait Ambulation/Gait assistance: Min assist, +2 safety/equipment Gait Distance (Feet): 30 Feet (x2) Assistive device: Rolling walker (2 wheels) Gait Pattern/deviations: Step-through pattern, Decreased stride length, Trunk flexed       General Gait Details: cues for fixing gaze on stationary object and walking towards objects, cues for posture and RW positioning, pt reported mild frontal (forehead) dizziness so took seated rest break as pt's HR also elevated to 144 bpm (RN notified); after short rest break pt able to ambulate back to room   Stairs             Wheelchair Mobility    Modified Rankin (Stroke Patients Only)       Balance                                            Cognition Arousal/Alertness: Awake/alert Behavior During Therapy: WFL for tasks assessed/performed Overall Cognitive Status: Within Functional Limits for tasks assessed  Exercises      General Comments        Pertinent Vitals/Pain Pain Assessment Pain Assessment: No/denies pain    Home Living                          Prior Function            PT Goals (current goals can now be found in the care plan section) Progress towards PT goals: Progressing toward goals    Frequency    Min 1X/week      PT  Plan Discharge plan needs to be updated    Co-evaluation              AM-PAC PT "6 Clicks" Mobility   Outcome Measure  Help needed turning from your back to your side while in a flat bed without using bedrails?: A Little Help needed moving from lying on your back to sitting on the side of a flat bed without using bedrails?: A Little Help needed moving to and from a bed to a chair (including a wheelchair)?: A Lot Help needed standing up from a chair using your arms (e.g., wheelchair or bedside chair)?: A Lot Help needed to walk in hospital room?: A Lot Help needed climbing 3-5 steps with a railing? : Total 6 Click Score: 13    End of Session Equipment Utilized During Treatment: Gait belt Activity Tolerance: Patient tolerated treatment well Patient left: with call bell/phone within reach;with family/visitor present;in chair;with chair alarm set Nurse Communication: Mobility status PT Visit Diagnosis: History of falling (Z91.81);Difficulty in walking, not elsewhere classified (R26.2);BPPV BPPV - Right/Left : Right     Time: 1010-1040 PT Time Calculation (min) (ACUTE ONLY): 30 min  Charges:  $Gait Training: 8-22 mins $Canalith Rep Proc: 8-22 mins                     Paulino Door, DPT Physical Therapist Acute Rehabilitation Services Office: [phone]    [name] 03/11/2023, 1:37 PM

## 2023-03-11 NOTE — [place] FL2 (Signed)
[place] MEDICAID FL2 LEVEL OF CARE FORM     IDENTIFICATION  Patient Name: Crystal [name] Birthdate: 18-Sep-1946 Sex: female Admission Date (Current Location): 03/06/2023  [facility] and [place] Number:  Producer, television/film/video and Address:  [facility],  [address] [place], [place] [address]      Provider Number: [id number]  Attending Physician Name and Address:  Burnadette Pop, MD  Relative Name and Phone Number:  Signe Colt (sister)[phone]    Current Level of Care: Hospital Recommended Level of Care: Skilled Nursing Facility Prior Approval Number:    Date Approved/Denied:   PASRR Number:    Discharge Plan: SNF    Current Diagnoses: Patient Active Problem List   Diagnosis Date Noted   Postural dizziness with presyncope 03/07/2023   Frequent PVCs 03/07/2023   Thrombocytopenia (HCC) 03/07/2023   Anemia associated with chemotherapy 03/07/2023   UTI (urinary tract infection) 03/07/2023   Sinus tachycardia 03/06/2023   History of DVT (deep vein thrombosis) 03/06/2023   DNR (do not resuscitate)/DNI(Do Not Intubate) 03/06/2023   Pleural effusion, malignant 01/13/2023   Neutropenia (HCC) 12/08/2022   Recurrent right pleural effusion 11/16/2022   Acute pulmonary embolism (HCC) 11/12/2022   Acute hypoxemic respiratory failure (HCC) 11/12/2022   Metastatic non-small cell lung cancer (HCC) 11/12/2022   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   Rib fracture 11/12/2022   Anxiety 11/12/2022   CKD stage 3a, GFR 45-59 ml/min (HCC) - baseline SCr 0.9-1.1 11/12/2022   Chronic HFrEF (heart failure with reduced ejection fraction) (HCC) 11/12/2022   Metastasis to spinal column (HCC) 10/03/2022   Primary cancer of right middle lobe of lung (HCC) 09/29/2022   Intracranial bleed (HCC) 09/13/2022   Lung mass 09/13/2022   Hypokalemia 09/13/2022   Tachycardia 08/29/2021   Preop cardiovascular exam 08/29/2020   Symptomatic cholelithiasis 06/09/2020   S/P laparoscopic  cholecystectomy 06/09/2020   Chest pain with moderate risk for cardiac etiology 02/14/2018   Right upper quadrant pain 02/12/2018   Tremor 02/11/2014   Non Hodgkin's lymphoma (HCC) 08/18/2013   Neck pain on left side 05/30/2013   Dizziness 05/30/2013   Moderate obesity 05/21/2013   Lung crackles 05/21/2013   Dyspnea on exertion 05/21/2013   Nonischemic cardiomyopathy (HCC)    Essential hypertension    Dyslipidemia, goal LDL below 100     Orientation RESPIRATION BLADDER Height & Weight     Self, Time, Situation, Place  Normal External catheter Weight: 91.2 kg Height:  5\' 5"  (165.1 cm)  BEHAVIORAL SYMPTOMS/MOOD NEUROLOGICAL BOWEL NUTRITION STATUS      Continent Diet (regular diet)  AMBULATORY STATUS COMMUNICATION OF NEEDS Skin   Limited Assist Verbally PU Stage and Appropriate Care (stage 2 pressue injury-foam dressing)   PU Stage 2 Dressing: Daily                   Personal Care Assistance Level of Assistance  Bathing, Feeding, Dressing Bathing Assistance: Limited assistance Feeding assistance: Limited assistance Dressing Assistance: Limited assistance     Functional Limitations Info  Sight, Hearing, Speech Sight Info: Adequate Hearing Info: Adequate Speech Info: Adequate    SPECIAL CARE FACTORS FREQUENCY  PT (By licensed PT), OT (By licensed OT)     PT Frequency: 5x/wk OT Frequency: 5/wk            Contractures Contractures Info: Not present    Additional Factors Info  Code Status, Allergies, Psychotropic Code Status Info: DNR Allergies Info: Simvastatin, Zoloft (Sertraline), Codeine, Coreg, Lisinopril, Tizanidine Hcl Psychotropic Info:  N/A         Current Medications (03/11/2023):  This is the current hospital active medication list Current Facility-Administered Medications  Medication Dose Route Frequency Provider Last Rate Last Admin   acetaminophen (TYLENOL) tablet 650 mg  650 mg Oral Q6H PRN Reva Bores, MD       Chlorhexidine Gluconate  Cloth 2 % PADS 6 each  6 each Topical Daily Burnadette Pop, MD   6 each at 03/09/23 1103   ferrous sulfate tablet 325 mg  325 mg Oral Q breakfast Burnadette Pop, MD   325 mg at 03/11/23 0725   folic acid (FOLVITE) tablet 1 mg  1 mg Oral Daily Reva Bores, MD   1 mg at 03/11/23 1104   LORazepam (ATIVAN) tablet 0.5 mg  0.5 mg Oral BID PRN Reva Bores, MD   0.5 mg at 03/10/23 2159   magnesium oxide (MAG-OX) tablet 400 mg  400 mg Oral BID Reva Bores, MD   400 mg at 03/11/23 1104   meclizine (ANTIVERT) tablet 25 mg  25 mg Oral TID Burnadette Pop, MD   25 mg at 03/11/23 1103   metoprolol succinate (TOPROL-XL) 24 hr tablet 50 mg  50 mg Oral Daily Adhikari, Willia Craze, MD   50 mg at 03/11/23 1103   metoprolol tartrate (LOPRESSOR) injection 5 mg  5 mg Intravenous Q6H PRN Reva Bores, MD       ondansetron [facility]) tablet 4 mg  4 mg Oral Q6H PRN Reva Bores, MD       Or   ondansetron [facility]) injection 4 mg  4 mg Intravenous Q6H PRN Reva Bores, MD   4 mg at 03/07/23 0225   oxyCODONE (Oxy IR/ROXICODONE) immediate release tablet 5 mg  5 mg Oral Q6H PRN Reva Bores, MD       pantoprazole (PROTONIX) EC tablet 40 mg  40 mg Oral BID Reva Bores, MD   40 mg at 03/11/23 1104   polyethylene glycol (MIRALAX / GLYCOLAX) packet 17 g  17 g Oral Daily PRN Reva Bores, MD       sodium chloride flush (NS) 0.9 % injection 10-40 mL  10-40 mL Intracatheter Q12H Adhikari, Amrit, MD   10 mL at 03/10/23 2107   sodium chloride flush (NS) 0.9 % injection 10-40 mL  10-40 mL Intracatheter PRN Burnadette Pop, MD       tamsulosin (FLOMAX) capsule 0.4 mg  0.4 mg Oral Daily Burnadette Pop, MD   0.4 mg at 03/11/23 1104     Discharge Medications: Please see discharge summary for a list of discharge medications.  Relevant Imaging Results:  Relevant Lab Results:   Additional Information SSN [id number]  Howell Rucks, RN

## 2023-03-11 NOTE — Plan of Care (Signed)

## 2023-03-11 NOTE — TOC Progression Note (Addendum)
Transition of Care [facility]) - Progression Note    Patient Details  Name: Crystal [name] MRN: [id number] Date of Birth: Oct 16, 1946  Transition of Care [facility]) CM/SW Contact  Howell Rucks, RN Phone Number: 03/11/2023, 12:52 PM  Clinical Narrative:   Met with pt and sister at bedside per pt/family request to speak with NCM regarding PT short term rehab-SNF recommendation, NCM introduced role of TOC/NCM, agreeable to short term rehab-SNF, no preference but reports has visited [address] , [place] and [place]. NCM unable to access NCMust to initiate PASRR. TOC will continue to follow.  - 3:11pm PASRR initiated, teams chat to MD to cosign FL2 and 30 day note.  - 3:42pm FL2, 30 day noted, H&P upload in PASRR, await PASRR    Expected Discharge Plan: Home w Home Health Services Barriers to Discharge: Continued Medical Work up  Expected Discharge Plan and Services In-house Referral: NA Discharge Planning Services: CM Consult Post Acute Care Choice: NA Living arrangements for the past 2 months: Single Family Home                 DME Arranged: N/A         HH Arranged: Refused HH           Social Determinants of Health (SDOH) Interventions SDOH Screenings   Food Insecurity: No Food Insecurity (03/07/2023)  Housing: Low Risk  (03/07/2023)  Transportation Needs: No Transportation Needs (03/07/2023)  Utilities: Not At Risk (03/07/2023)  Depression (PHQ2-9): Low Risk  (03/02/2023)  Tobacco Use: Low Risk  (03/06/2023)    Readmission Risk Interventions    03/09/2023   12:06 PM  Readmission Risk Prevention Plan  Transportation Screening Complete  Medication Review (RN Care Manager) Complete  PCP or Specialist appointment within 3-5 days of discharge Complete  HRI or Home Care Consult Complete  SW Recovery Care/Counseling Consult Complete  Palliative Care Screening Not Applicable  Skilled Nursing Facility Not Applicable
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
## 2023-03-11 NOTE — TOC CM/SW Note (Signed)
30 Day PASRR Note   Patient Details  Name: Crystal [name] Date of Birth: [birth year]   Transition of Care [facility]) CM/SW Contact:    Howell Rucks, RN Phone Number: 03/11/2023, 2:32 PM  To Whom It May Concern:  Please be advised that this patient will require a short-term nursing home stay - anticipated 30 days or less for rehabilitation and strengthening.   The plan is for return home.

## 2023-03-12 ENCOUNTER — Other Ambulatory Visit (HOSPITAL_BASED_OUTPATIENT_CLINIC_OR_DEPARTMENT_OTHER): Payer: No Typology Code available for payment source

## 2023-03-12 DIAGNOSIS — R55 Syncope and collapse: Secondary | ICD-10-CM | POA: Diagnosis not present

## 2023-03-12 DIAGNOSIS — R42 Dizziness and giddiness: Secondary | ICD-10-CM | POA: Diagnosis not present

## 2023-03-12 LAB — BPAM RBC
Blood Product Expiration Date: 202406122359
ISSUE DATE / TIME: 202405181547
Unit Type and Rh: 600

## 2023-03-12 LAB — TYPE AND SCREEN
ABO/RH(D): A NEG
Antibody Screen: NEGATIVE
Unit division: 0

## 2023-03-12 MED ORDER — METOPROLOL TARTRATE 25 MG PO TABS
25.0000 mg | ORAL_TABLET | Freq: Three times a day (TID) | ORAL | Status: DC
  Administered 2023-03-12 – 2023-03-15 (×10): 25 mg via ORAL
  Filled 2023-03-12 (×10): qty 1

## 2023-03-12 NOTE — Progress Notes (Signed)
Occupational Therapy Treatment Patient Details Name: Crystal [name] MRN: [id number] DOB: 04-Aug-1946 Today's Date: 03/12/2023   History of present illness Patient is a 77 year old female with history of non-Hodgkin's lymphoma, metastatic non-small cell lung cancer, hypertension, CKD stage IIIa, history of DVT/PE on Eliquis who was just discharged on 5/14 from here presented again 03/06/23  with a fall. MRI of the brain showed 4 mm subdural/subarachnoid hemorrhage in the anterior left falx.  Neurosurgery did not recommend any intervention.   OT comments  Pt making progress with functional goals. Pt in bed upon arrival with her sister present. Pt sat EOB without difficulty with HOB raised. Pt c/o dizziness once seated EOB and required several minutes for dizziness to subside. Tele box not working to view vitals and NT notified. Pt required min A to power up from EOB to RW with 2 attempts sit - stand for SPTs, simulated toileting tasks to manage clothing mod A with dizziness re occurring, pt returned to sitting in recliner.  Pt sat EOB for grooming/hygiene tasks with Sup and min guard A for UB dressing. Pt very pleasant and cooperative, her sister very supportive. OT will continue to follow acutely to maximize level of function and safety   Recommendations for follow up therapy are one component of a multi-disciplinary discharge planning process, led by the attending physician.  Recommendations may be updated based on patient status, additional functional criteria and insurance authorization.    Assistance Recommended at Discharge Intermittent Supervision/Assistance  Patient can return home with the following  Assist for transportation;Assistance with cooking/housework;Help with stairs or ramp for entrance;A lot of help with bathing/dressing/bathroom;A little help with walking and/or transfers   Equipment Recommendations       Recommendations for Other Services      Precautions / Restrictions  Precautions Precautions: Fall Precaution Comments: monitor HR Restrictions Other Position/Activity Restrictions: dizziness with activity       Mobility Bed Mobility Overal bed mobility: Needs Assistance       Supine to sit: Min guard, HOB elevated     General bed mobility comments: min guard A for safety; lightheaded upon sitting EOB    Transfers Overall transfer level: Needs assistance Equipment used: Rolling walker (2 wheels) Transfers: Sit to/from Stand Sit to Stand: Min assist Stand pivot transfers: Min assist         General transfer comment: cues for hand placement, LE tremors present     Balance Overall balance assessment: Needs assistance, History of Falls Sitting-balance support: No upper extremity supported Sitting balance-Leahy Scale: Good     Standing balance support: Bilateral upper extremity supported, During functional activity, Reliant on assistive device for balance                               ADL either performed or assessed with clinical judgement   ADL Overall ADL's : Needs assistance/impaired     Grooming: Wash/dry face;Wash/dry hands;Supervision/safety;Sitting               Lower Body Dressing: Maximal assistance Lower Body Dressing Details (indicate cue type and reason): max a to don socks Toilet Transfer: Minimal assistance;Stand-pivot;Rolling walker (2 wheels);Cueing for safety   Toileting- Clothing Manipulation and Hygiene: Moderate assistance       Functional mobility during ADLs: Minimal assistance      Extremity/Trunk Assessment Upper Extremity Assessment Upper Extremity Assessment: Overall WFL for tasks assessed   Lower Extremity Assessment Lower Extremity Assessment: Defer  to PT evaluation   Cervical / Trunk Assessment Cervical / Trunk Assessment: Normal    Vision Baseline Vision/History: 1 Wears glasses Ability to See in Adequate Light: 0 Adequate Patient Visual Report: Blurring of vision      Perception     Praxis      Cognition Arousal/Alertness: Awake/alert Behavior During Therapy: WFL for tasks assessed/performed Overall Cognitive Status: Within Functional Limits for tasks assessed                                          Exercises      Shoulder Instructions       General Comments      Pertinent Vitals/ Pain       Pain Assessment Pain Assessment: No/denies pain Pain Score: 0-No pain Pain Intervention(s): Monitored during session, Repositioned  Home Living                                          Prior Functioning/Environment              Frequency  Min 1X/week        Progress Toward Goals  OT Goals(current goals can now be found in the care plan section)  Progress towards OT goals: Progressing toward goals     Plan Discharge plan remains appropriate    Co-evaluation                 AM-PAC OT "6 Clicks" Daily Activity     Outcome Measure   Help from another person eating meals?: None Help from another person taking care of personal grooming?: A Little Help from another person toileting, which includes using toliet, bedpan, or urinal?: A Lot Help from another person bathing (including washing, rinsing, drying)?: A Lot Help from another person to put on and taking off regular upper body clothing?: None Help from another person to put on and taking off regular lower body clothing?: A Lot 6 Click Score: 17    End of Session Equipment Utilized During Treatment: Rolling walker (2 wheels);Gait belt;Other (comment) (BSC)  OT Visit Diagnosis: Unsteadiness on feet (R26.81);Dizziness and giddiness (R42)   Activity Tolerance Patient tolerated treatment well   Patient Left with call bell/phone within reach;in chair;with family/visitor present   Nurse Communication          Time: 2956-2130 OT Time Calculation (min): 23 min  Charges: OT General Charges $OT Visit: 1 Visit OT Treatments $Self  Care/Home Management : 8-22 mins $Therapeutic Activity: 8-22 mins    Galen Manila 03/12/2023, 2:13 PM

## 2023-03-12 NOTE — Care Management Important Message (Signed)
Important Message  Patient Details IM Letter given Name: Crystal [name] MRN: [id number] Date of Birth: [birth year]   Medicare Important Message Given:  Yes     [name] 03/12/2023, 10:18 AM

## 2023-03-12 NOTE — Progress Notes (Signed)
Physical Therapy Treatment Patient Details Name: Crystal [name] MRN: [id number] DOB: [birth year] Today's Date: 03/12/2023   History of Present Illness Patient is a 77 year old female with history of non-Hodgkin's lymphoma, metastatic non-small cell lung cancer, hypertension, CKD stage IIIa, history of DVT/PE on Eliquis who was just discharged on 5/14 from here presented again 03/06/23  with a fall. MRI of the brain showed 4 mm subdural/subarachnoid hemorrhage in the anterior left falx.  Neurosurgery did not recommend any intervention.    PT Comments    Pt with gradual improvement but continues to remain high fall risk, requires chair follow, and fatigues easily.  She is staying with sister who is unable to assist so would continue to benefit from post acute therapy.   In regards to vitals: HR improved today with activity with max of 125 bpm.  She does have a drop in BP with static standing with leg tremors and mild lightheadedness; however, if progressed to walking improved BP and all symptoms.    In regards to vertigo:  Tested and still + bil posterior canal BPPV but R side remains worse.  Performed modified epley maneuver x 2 with 90 sec in each position.  Symptoms significantly less on second round but then pt with nystagmus upon return to sitting.  Pt fatigued at this point.  Will f/u tomorrow as able.    Recommendations for follow up therapy are one component of a multi-disciplinary discharge planning process, led by the attending physician.  Recommendations may be updated based on patient status, additional functional criteria and insurance authorization.  Follow Up Recommendations  Can patient physically be transported by private vehicle: Yes    Assistance Recommended at Discharge Frequent or constant Supervision/Assistance  Patient can return home with the following Assist for transportation;Assistance with cooking/housework;Help with stairs or ramp for entrance;A little help with  walking and/or transfers;A little help with bathing/dressing/bathroom   Equipment Recommendations  None recommended by PT    Recommendations for Other Services       Precautions / Restrictions Precautions Precautions: Fall Precaution Comments: monitor HR and orthostatic Restrictions Other Position/Activity Restrictions: dizziness with activity     Mobility  Bed Mobility Overal bed mobility: Needs Assistance Bed Mobility: Supine to Sit, Sit to Supine     Supine to sit: Min guard, HOB elevated Sit to supine: Min guard, HOB elevated   General bed mobility comments: min guard A for safety; lightheaded upon sitting EOB (see vitals)    Transfers Overall transfer level: Needs assistance Equipment used: Rolling walker (2 wheels) Transfers: Sit to/from Stand Sit to Stand: Min assist           General transfer comment: Cues for hand placement, min A to steady, LE tremors present with static stand but not as much with gait.  Performed x 4 during session    Ambulation/Gait Ambulation/Gait assistance: Min assist, +2 safety/equipment Gait Distance (Feet): 70 Feet (70', 60', 10') Assistive device: Rolling walker (2 wheels) Gait Pattern/deviations: Step-through pattern, Trunk flexed       General Gait Details: Min cues for RW proximity and posture, good carry over of picking focus points for ambulation, denied any dizziness or lightheadedness , did fatigue easily.; HR max 123 bpm   Stairs             Wheelchair Mobility    Modified Rankin (Stroke Patients Only)       Balance Overall balance assessment: Needs assistance, History of Falls Sitting-balance support: No upper extremity supported Sitting balance-Leahy  Scale: Good     Standing balance support: Bilateral upper extremity supported, During functional activity, Reliant on assistive device for balance Standing balance-Leahy Scale: Poor                              Cognition  Arousal/Alertness: Awake/alert Behavior During Therapy: WFL for tasks assessed/performed Overall Cognitive Status: Within Functional Limits for tasks assessed                                          Exercises      General Comments  Orthostatic BP assessment: Supine 127/88 with HR 105 Sitting 120/80 with HR 112 Static Stand 91/56 with HR 118, mild lightheadedness and leg tremors Returned to sitting and rested.  Attempted standing briefly then moving onto walking - improved symptoms and no leg tremors.  BP was 120/90 immediately upon sitting after ambulation and HR max 125 bpm with ambulation.    Vestibular: [employer]: + bil but worse symptoms and nystagmus on R Performed Epley maneuver x 2 with 90 sec hold each position and with rest break.  Pt with less symptoms 2nd round but then had nystagmus and dizziness with return to sitting.  Pt fatigued.  Will continue to assess.        Pertinent Vitals/Pain Pain Assessment Pain Assessment: No/denies pain    Home Living                          Prior Function            PT Goals (current goals can now be found in the care plan section) Progress towards PT goals: Progressing toward goals    Frequency    Min 1X/week      PT Plan Current plan remains appropriate    Co-evaluation              AM-PAC PT "6 Clicks" Mobility   Outcome Measure  Help needed turning from your back to your side while in a flat bed without using bedrails?: A Little Help needed moving from lying on your back to sitting on the side of a flat bed without using bedrails?: A Little Help needed moving to and from a bed to a chair (including a wheelchair)?: A Little Help needed standing up from a chair using your arms (e.g., wheelchair or bedside chair)?: A Little Help needed to walk in hospital room?: Total (required chair follow) Help needed climbing 3-5 steps with a railing? : Total 6 Click Score: 14    End of  Session Equipment Utilized During Treatment: Gait belt Activity Tolerance: Patient tolerated treatment well Patient left: with call bell/phone within reach;with family/visitor present;in bed;with bed alarm set Nurse Communication: Mobility status PT Visit Diagnosis: History of falling (Z91.81);Difficulty in walking, not elsewhere classified (R26.2);BPPV BPPV - Right/Left : Right     Time: 1412-1500 PT Time Calculation (min) (ACUTE ONLY): 48 min  Charges:  $Gait Training: 8-22 mins $Therapeutic Activity: 8-22 mins $Canalith Rep Proc: 8-22 mins                     Anise Salvo, PT Acute Rehab [facility] [phone]    Rayetta Humphrey 03/12/2023, 3:54 PM
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
## 2023-03-13 DIAGNOSIS — R42 Dizziness and giddiness: Secondary | ICD-10-CM | POA: Diagnosis not present

## 2023-03-13 DIAGNOSIS — R55 Syncope and collapse: Secondary | ICD-10-CM | POA: Diagnosis not present

## 2023-03-13 LAB — CBC
HCT: 28.9 % — ABNORMAL LOW (ref 36.0–46.0)
Hemoglobin: 9.2 g/dL — ABNORMAL LOW (ref 12.0–15.0)
MCH: 31.6 pg (ref 26.0–34.0)
MCHC: 31.8 g/dL (ref 30.0–36.0)
MCV: 99.3 fL (ref 80.0–100.0)
Platelets: 137 10*3/uL — ABNORMAL LOW (ref 150–400)
RBC: 2.91 MIL/uL — ABNORMAL LOW (ref 3.87–5.11)
RDW: 19.2 % — ABNORMAL HIGH (ref 11.5–15.5)
WBC: 6.6 10*3/uL (ref 4.0–10.5)
nRBC: 0 % (ref 0.0–0.2)

## 2023-03-13 LAB — BASIC METABOLIC PANEL
Anion gap: 7 (ref 5–15)
BUN: 10 mg/dL (ref 8–23)
CO2: 27 mmol/L (ref 22–32)
Calcium: 7.2 mg/dL — ABNORMAL LOW (ref 8.9–10.3)
Chloride: 102 mmol/L (ref 98–111)
Creatinine, Ser: 1.1 mg/dL — ABNORMAL HIGH (ref 0.44–1.00)
GFR, Estimated: 52 mL/min — ABNORMAL LOW (ref >60)
Glucose, Bld: 94 mg/dL (ref 70–99)
Potassium: 2.9 mmol/L — ABNORMAL LOW (ref 3.5–5.1)
Sodium: 136 mmol/L (ref 135–145)

## 2023-03-13 LAB — MAGNESIUM: Magnesium: 1.2 mg/dL — ABNORMAL LOW (ref 1.7–2.4)

## 2023-03-13 MED ORDER — APIXABAN 5 MG PO TABS
5.0000 mg | ORAL_TABLET | Freq: Two times a day (BID) | ORAL | Status: DC
  Administered 2023-03-15 – 2023-03-21 (×13): 5 mg via ORAL
  Filled 2023-03-13 (×13): qty 1

## 2023-03-13 MED ORDER — MECLIZINE HCL 25 MG PO TABS: 25.0000 mg | ORAL_TABLET | Freq: Three times a day (TID) | ORAL | Status: DC | PRN

## 2023-03-13 MED ORDER — MAGNESIUM SULFATE 4 GM/100ML IV SOLN
4.0000 g | Freq: Once | INTRAVENOUS | Status: AC
  Administered 2023-03-13: 4 g via INTRAVENOUS
  Filled 2023-03-13: qty 100

## 2023-03-13 MED ORDER — POTASSIUM CHLORIDE CRYS ER 20 MEQ PO TBCR
40.0000 meq | EXTENDED_RELEASE_TABLET | ORAL | Status: AC
  Administered 2023-03-13 (×2): 40 meq via ORAL
  Filled 2023-03-13 (×2): qty 2
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
## 2023-03-13 NOTE — TOC Progression Note (Addendum)
Transition of Care [facility]) - Progression Note    Patient Details  Name: Crystal [name] MRN: [id number] Date of Birth: 23-Jan-1946  Transition of Care [facility]) CM/SW Contact  Beckie Busing, RN Phone [phone]  03/13/2023, 2:02 PM  Clinical Narrative:    CM reviewing for bed offers. Currently there are no bed offers with 15 bed request submitted. 10 request have been denied. CM will continue to follow   Expected Discharge Plan: Home w Home Health Services Barriers to Discharge: Continued Medical Work up  Expected Discharge Plan and Services In-house Referral: NA Discharge Planning Services: CM Consult Post Acute Care Choice: NA Living arrangements for the past 2 months: Single Family Home                 DME Arranged: N/A         HH Arranged: Refused HH           Social Determinants of Health (SDOH) Interventions SDOH Screenings   Food Insecurity: No Food Insecurity (03/07/2023)  Housing: Low Risk  (03/07/2023)  Transportation Needs: No Transportation Needs (03/07/2023)  Utilities: Not At Risk (03/07/2023)  Depression (PHQ2-9): Low Risk  (03/02/2023)  Tobacco Use: Low Risk  (03/06/2023)    Readmission Risk Interventions    03/09/2023   12:06 PM  Readmission Risk Prevention Plan  Transportation Screening Complete  Medication Review (RN Care Manager) Complete  PCP or Specialist appointment within 3-5 days of discharge Complete  HRI or Home Care Consult Complete  SW Recovery Care/Counseling Consult Complete  Palliative Care Screening Not Applicable  Skilled Nursing Facility Not Applicable
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
## 2023-03-14 ENCOUNTER — Inpatient Hospital Stay: Payer: No Typology Code available for payment source

## 2023-03-14 ENCOUNTER — Ambulatory Visit (HOSPITAL_BASED_OUTPATIENT_CLINIC_OR_DEPARTMENT_OTHER): Payer: No Typology Code available for payment source

## 2023-03-14 ENCOUNTER — Inpatient Hospital Stay: Payer: No Typology Code available for payment source | Admitting: Nurse Practitioner

## 2023-03-14 DIAGNOSIS — R55 Syncope and collapse: Secondary | ICD-10-CM | POA: Diagnosis not present

## 2023-03-14 DIAGNOSIS — R42 Dizziness and giddiness: Secondary | ICD-10-CM | POA: Diagnosis not present

## 2023-03-14 LAB — BASIC METABOLIC PANEL
Anion gap: 5 (ref 5–15)
BUN: 11 mg/dL (ref 8–23)
CO2: 27 mmol/L (ref 22–32)
Calcium: 7.5 mg/dL — ABNORMAL LOW (ref 8.9–10.3)
Chloride: 105 mmol/L (ref 98–111)
Creatinine, Ser: 0.99 mg/dL (ref 0.44–1.00)
GFR, Estimated: 59 mL/min — ABNORMAL LOW (ref >60)
Glucose, Bld: 97 mg/dL (ref 70–99)
Potassium: 3.6 mmol/L (ref 3.5–5.1)
Sodium: 137 mmol/L (ref 135–145)

## 2023-03-14 LAB — MAGNESIUM: Magnesium: 2.1 mg/dL (ref 1.7–2.4)

## 2023-03-14 MED ORDER — OXYCODONE HCL 5 MG PO TABS
5.0000 mg | ORAL_TABLET | Freq: Four times a day (QID) | ORAL | 0 refills | Status: DC | PRN
Start: 2023-03-14 — End: 2023-03-21

## 2023-03-14 MED ORDER — LORAZEPAM 0.5 MG PO TABS
0.5000 mg | ORAL_TABLET | Freq: Two times a day (BID) | ORAL | 0 refills | Status: DC | PRN
Start: 2023-03-14 — End: 2023-03-21
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
## 2023-03-14 NOTE — Progress Notes (Signed)
Physical Therapy Treatment Patient Details Name: Crystal [name] MRN: [id number] DOB: September 21, 1946 Today's Date: 03/14/2023   History of Present Illness Patient is a 77 year old female with history of non-Hodgkin's lymphoma, metastatic non-small cell lung cancer, hypertension, CKD stage IIIa, history of DVT/PE on Eliquis who was just discharged on 5/14 from here presented again 03/06/23  with a fall. MRI of the brain showed 4 mm subdural/subarachnoid hemorrhage in the anterior left falx.  Neurosurgery did not recommend any intervention.    PT Comments    Pt making gradual progress.  She does remain a fall risk and continue to recommend post acute rehab, but noting difficulty finding a bed.  If returns home would need frequent supervision and max home health services.  She is ambulating multiple bouts but with rest breaks , cues for safety, chair follow, and remains fall risk.  Her leg tremors in standing did improve today and no lightheadedness with gait.   Pt does continue to have persistent BPPV symptoms with return to bed.  Still with stronger positive on R.  Treated twice with modified Epley maneuver with decreased symptoms second rep.  May need to consider treating left side in addition due to persistent symptoms.  If continues to be present after SNF or HHPT could consider outpatient vestibular therapy.     Recommendations for follow up therapy are one component of a multi-disciplinary discharge planning process, led by the attending physician.  Recommendations may be updated based on patient status, additional functional criteria and insurance authorization.  Follow Up Recommendations  Can patient physically be transported by private vehicle: Yes    Assistance Recommended at Discharge Frequent or constant Supervision/Assistance  Patient can return home with the following Assist for transportation;Assistance with cooking/housework;Help with stairs or ramp for entrance;A little help with  walking and/or transfers;A little help with bathing/dressing/bathroom   Equipment Recommendations  Wheelchair cushion (measurements PT);Wheelchair (measurements PT) (if has to go home could benefit from w/c for community mobility)    Recommendations for Other Services       Precautions / Restrictions Precautions Precautions: Fall Precaution Comments: monitor HR and orthostatic     Mobility  Bed Mobility Overal bed mobility: Needs Assistance Bed Mobility: Rolling, Sidelying to Sit, Sit to Sidelying Rolling: Min guard Sidelying to sit: Min assist     Sit to sidelying: Min assist General bed mobility comments: Performed multiple rolls and sit to sidelying with Epleys    Transfers Overall transfer level: Needs assistance Equipment used: Rolling walker (2 wheels) Transfers: Sit to/from Stand Sit to Stand: Min guard           General transfer comment: Performed x 5 during session with good hand placement.  No leg tremors today    Ambulation/Gait Ambulation/Gait assistance: Min guard Gait Distance (Feet): 70 Feet (40', 70'x2, 20') Assistive device: Rolling walker (2 wheels) Gait Pattern/deviations: Step-through pattern, Trunk flexed       General Gait Details: Min cues for RW proximity and posture.  Pt tending to get too fast - cued to slow down and get close to RW (family reports that is somewhat baseline).   Good carry over of picking focus points for ambulation, denied any dizziness or lightheadedness , did fatigue easily.; HR max 130 bpm   Stairs             Wheelchair Mobility    Modified Rankin (Stroke Patients Only)       Balance Overall balance assessment: Needs assistance, History of Falls Sitting-balance support:  No upper extremity supported Sitting balance-Leahy Scale: Good Sitting balance - Comments: Static sitting, weight shifting , reaching, eating EOB all with good balance.  Did have dizziness and sensation of falling with return to sitting  after first Epley requiring max A   Standing balance support: Bilateral upper extremity supported, During functional activity, Reliant on assistive device for balance Standing balance-Leahy Scale: Poor Standing balance comment: Needs RW but no tremors today                            Cognition Arousal/Alertness: Awake/alert Behavior During Therapy: WFL for tasks assessed/performed Overall Cognitive Status: Within Functional Limits for tasks assessed                                          Exercises      General Comments   Vestibular:  Positive [place] with worse symptoms and nystagmus on R.  Treated twice (pre and post walk) with modified Epley with pt having severe symptoms first round and mild second.  With transition to sitting first rep pt with severe sensation of falling requiring mod-max A for balance.  Improved seconds rep.       Pertinent Vitals/Pain Pain Assessment Pain Assessment: No/denies pain    Home Living                          Prior Function            PT Goals (current goals can now be found in the care plan section) Progress towards PT goals: Progressing toward goals    Frequency    Min 1X/week      PT Plan Current plan remains appropriate    Co-evaluation              AM-PAC PT "6 Clicks" Mobility   Outcome Measure  Help needed turning from your back to your side while in a flat bed without using bedrails?: A Little Help needed moving from lying on your back to sitting on the side of a flat bed without using bedrails?: A Little Help needed moving to and from a bed to a chair (including a wheelchair)?: A Little Help needed standing up from a chair using your arms (e.g., wheelchair or bedside chair)?: A Little Help needed to walk in hospital room?: A Little Help needed climbing 3-5 steps with a railing? : A Lot 6 Click Score: 17    End of Session Equipment Utilized During Treatment:  Gait belt Activity Tolerance: Patient tolerated treatment well Patient left: with call bell/phone within reach;with family/visitor present;in bed;with bed alarm set (sitting EOB to eat) Nurse Communication: Mobility status PT Visit Diagnosis: History of falling (Z91.81);Difficulty in walking, not elsewhere classified (R26.2);BPPV BPPV - Right/Left : Right     Time: 1610-9604 PT Time Calculation (min) (ACUTE ONLY): 33 min  Charges:  $Gait Training: 8-22 mins $Canalith Rep Proc: 8-22 mins                     Anise Salvo, PT Acute Rehab [facility] [phone]    Rayetta Humphrey 03/14/2023, 1:44 PM

## 2023-03-14 NOTE — TOC Progression Note (Addendum)
Transition of Care [facility]) - Progression Note    Patient Details  Name: Crystal [name] MRN: [id number] Date of Birth: June 15, 1946  [facility] CM/SW Contact  Beckie Busing, RN Phone Number:[phone]  03/14/2023, 8:34 AM  Clinical Narrative:    No bed offers. Search expanded.  1000 CM has received message that family is requesting [facility]. Referral has been sent.  1200 PASRR # [id number]   1230 CM at bedside to update patient on bed offer status. Patient with sisters at bedside. CM has informed patient that currently there are no bed offers. Patient and sisters verbalized understanding. TOC will continue to follow.    Expected Discharge Plan: Home w Home Health Services Barriers to Discharge: Continued Medical Work up  Expected Discharge Plan and Services In-house Referral: NA Discharge Planning Services: CM Consult Post Acute Care Choice: NA Living arrangements for the past 2 months: Single Family Home                 DME Arranged: N/A         HH Arranged: Refused HH           Social Determinants of Health (SDOH) Interventions SDOH Screenings   Food Insecurity: No Food Insecurity (03/07/2023)  Housing: Low Risk  (03/07/2023)  Transportation Needs: No Transportation Needs (03/07/2023)  Utilities: Not At Risk (03/07/2023)  Depression (PHQ2-9): Low Risk  (03/02/2023)  Tobacco Use: Low Risk  (03/06/2023)    Readmission Risk Interventions    03/09/2023   12:06 PM  Readmission Risk Prevention Plan  Transportation Screening Complete  Medication Review (RN Care Manager) Complete  PCP or Specialist appointment within 3-5 days of discharge Complete  HRI or Home Care Consult Complete  SW Recovery Care/Counseling Consult Complete  Palliative Care Screening Not Applicable  Skilled Nursing Facility Not Applicable

## 2023-03-15 ENCOUNTER — Inpatient Hospital Stay: Payer: No Typology Code available for payment source

## 2023-03-15 ENCOUNTER — Encounter: Payer: Self-pay | Admitting: *Deleted

## 2023-03-15 DIAGNOSIS — R42 Dizziness and giddiness: Secondary | ICD-10-CM | POA: Diagnosis not present

## 2023-03-15 DIAGNOSIS — R55 Syncope and collapse: Secondary | ICD-10-CM | POA: Diagnosis not present

## 2023-03-15 MED ORDER — METOPROLOL TARTRATE 25 MG PO TABS
37.5000 mg | ORAL_TABLET | Freq: Three times a day (TID) | ORAL | Status: DC
  Administered 2023-03-15 – 2023-03-21 (×18): 37.5 mg via ORAL
  Filled 2023-03-15 (×18): qty 2
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
## 2023-03-15 NOTE — Progress Notes (Signed)
Per MD: Needs lab/flush/ov an Tecentriq late week 5/27. Scheduling message sent.

## 2023-03-15 NOTE — TOC Progression Note (Addendum)
Transition of Care [facility]) - Progression Note    Patient Details  Name: Crystal [name] MRN: [id number] Date of Birth: 1946/05/07  Transition of Care [facility]) CM/SW Contact  Beckie Busing, RN Phone Number:[phone]  03/15/2023, 12:39 PM  Clinical Narrative:    CM at bedside to update patient and sisters oon bed offers. Patient has received 25 plus bed denials. Currently has offer for [facility] ans [facility]. Patient does not want to go to these areas due to distance and states that they don't have good ratings. Patient has now decided that she would like to explore private pay options due to insurance out of network with most of the facilities. Patient has requested that CM follow up with [facility] and [facility] to determine if they have any private pay beds available.   [facility] has no bed availability. Message has been left for Brantley Persons SW at [facility]. CM will await return call.   [facility] does not have a bed at all.    Expected Discharge Plan: Home w Home Health Services Barriers to Discharge: Continued Medical Work up  Expected Discharge Plan and Services In-house Referral: NA Discharge Planning Services: CM Consult Post Acute Care Choice: NA Living arrangements for the past 2 months: Single Family Home                 DME Arranged: N/A         HH Arranged: Refused HH           Social Determinants of Health (SDOH) Interventions SDOH Screenings   Food Insecurity: No Food Insecurity (03/07/2023)  Housing: Low Risk  (03/07/2023)  Transportation Needs: No Transportation Needs (03/07/2023)  Utilities: Not At Risk (03/07/2023)  Depression (PHQ2-9): Low Risk  (03/02/2023)  Tobacco Use: Low Risk  (03/06/2023)    Readmission Risk Interventions    03/09/2023   12:06 PM  Readmission Risk Prevention Plan  Transportation Screening Complete  Medication Review (RN Care Manager) Complete  PCP or Specialist appointment within 3-5 days  of discharge Complete  HRI or Home Care Consult Complete  SW Recovery Care/Counseling Consult Complete  Palliative Care Screening Not Applicable  Skilled Nursing Facility Not Applicable

## 2023-03-15 NOTE — Progress Notes (Signed)
Physical Therapy Treatment Patient Details Name: Crystal [name] MRN: [id number] DOB: [birth year] Today's Date: 03/15/2023   History of Present Illness Patient is a 77 year old female with history of non-Hodgkin's lymphoma, metastatic non-small cell lung cancer, hypertension, CKD stage IIIa, history of DVT/PE on Eliquis who was just discharged on 5/14 from here presented again 03/06/23  with a fall. MRI of the brain showed 4 mm subdural/subarachnoid hemorrhage in the anterior left falx.  Neurosurgery did not recommend any intervention.    PT Comments    Pt continues to present with nystagmus with positioning testing.  Pt ambulated in hallway and requiring multiple seated rest breaks due to fatigue and LE tremors today.  She also continues to remain a fall risk so post acute rehab d/c recommendation remains appropriate, but noting difficulty finding a bed.  If returns home would need frequent supervision and max home health services.    Vestibular: R Dix Hallpike performed: right upbeating rotary nystagmus 15 seconds L Dix Hallpike performed: left upbeating rotary nystagmus 10 seconds, pt reports symptoms worse on left Performed Epleys maneuver for L BPPV Positional testing performed a second time as below R [employer] performed: right upbeating rotary nystagmus 10 seconds L Dix Hallpike performed: left upbeating rotary nystagmus 20 seconds, pt reports symptoms worse on left Performed Epleys maneuver for L BPPV    Recommendations for follow up therapy are one component of a multi-disciplinary discharge planning process, led by the attending physician.  Recommendations may be updated based on patient status, additional functional criteria and insurance authorization.  Follow Up Recommendations       Assistance Recommended at Discharge Frequent or constant Supervision/Assistance  Patient can return home with the following Assist for transportation;Assistance with cooking/housework;Help with  stairs or ramp for entrance;A little help with walking and/or transfers;A little help with bathing/dressing/bathroom   Equipment Recommendations  Wheelchair cushion (measurements PT);Wheelchair (measurements PT)    Recommendations for Other Services       Precautions / Restrictions Precautions Precautions: Fall Precaution Comments: monitor HR and orthostatic     Mobility  Bed Mobility Overal bed mobility: Needs Assistance Bed Mobility: Rolling, Sidelying to Sit, Sit to Sidelying Rolling: Min guard Sidelying to sit: Min assist     Sit to sidelying: Min assist General bed mobility comments: Performed multiple rolls and sit to sidelying with Epleys    Transfers Overall transfer level: Needs assistance Equipment used: Rolling walker (2 wheels) Transfers: Sit to/from Stand Sit to Stand: Min assist           General transfer comment: assist to control descent, pt with leg tremors today    Ambulation/Gait Ambulation/Gait assistance: Min guard Gait Distance (Feet): 60 Feet Assistive device: Rolling walker (2 wheels) Gait Pattern/deviations: Step-through pattern, Trunk flexed, Decreased stride length       General Gait Details: cues for posture and RW positioning; performed 20'x3 and then 60'x1, required seated rest breaks due to fatigue and LE tremors, cues for slowing down, HR up to 130bpm   Stairs             Wheelchair Mobility    Modified Rankin (Stroke Patients Only)       Balance                                            Cognition Arousal/Alertness: Awake/alert Behavior During Therapy: WFL for tasks assessed/performed Overall  Cognitive Status: Within Functional Limits for tasks assessed                                          Exercises      General Comments        Pertinent Vitals/Pain Pain Assessment Pain Assessment: No/denies pain    Home Living                          Prior  Function            PT Goals (current goals can now be found in the care plan section) Progress towards PT goals: Progressing toward goals    Frequency    Min 1X/week      PT Plan Current plan remains appropriate    Co-evaluation              AM-PAC PT "6 Clicks" Mobility   Outcome Measure  Help needed turning from your back to your side while in a flat bed without using bedrails?: A Little Help needed moving from lying on your back to sitting on the side of a flat bed without using bedrails?: A Little Help needed moving to and from a bed to a chair (including a wheelchair)?: A Little Help needed standing up from a chair using your arms (e.g., wheelchair or bedside chair)?: A Little Help needed to walk in hospital room?: A Little Help needed climbing 3-5 steps with a railing? : A Lot 6 Click Score: 17    End of Session Equipment Utilized During Treatment: Gait belt Activity Tolerance: Patient tolerated treatment well Patient left: in bed;with call bell/phone within reach;with bed alarm set;with family/visitor present Nurse Communication: Mobility status PT Visit Diagnosis: History of falling (Z91.81);Difficulty in walking, not elsewhere classified (R26.2);BPPV BPPV - Right/Left : Left     Time: 1400-1435 PT Time Calculation (min) (ACUTE ONLY): 35 min  Charges:  $Gait Training: 8-22 mins $Canalith Rep Proc: 8-22 mins                    Crystal [name], DPT Physical Therapist Acute Rehabilitation Services Office: [phone]    Crystal [name] Payson 03/15/2023, 4:04 PM

## 2023-03-16 ENCOUNTER — Other Ambulatory Visit: Payer: Self-pay

## 2023-03-16 ENCOUNTER — Inpatient Hospital Stay: Payer: No Typology Code available for payment source

## 2023-03-16 DIAGNOSIS — R55 Syncope and collapse: Secondary | ICD-10-CM | POA: Diagnosis not present

## 2023-03-16 DIAGNOSIS — R42 Dizziness and giddiness: Secondary | ICD-10-CM | POA: Diagnosis not present

## 2023-03-16 LAB — CBC
HCT: 30.8 % — ABNORMAL LOW (ref 36.0–46.0)
Hemoglobin: 9.8 g/dL — ABNORMAL LOW (ref 12.0–15.0)
MCH: 31.9 pg (ref 26.0–34.0)
MCHC: 31.8 g/dL (ref 30.0–36.0)
MCV: 100.3 fL — ABNORMAL HIGH (ref 80.0–100.0)
Platelets: 164 10*3/uL (ref 150–400)
RBC: 3.07 MIL/uL — ABNORMAL LOW (ref 3.87–5.11)
RDW: 19.2 % — ABNORMAL HIGH (ref 11.5–15.5)
WBC: 5.3 10*3/uL (ref 4.0–10.5)
nRBC: 0 % (ref 0.0–0.2)

## 2023-03-16 LAB — BASIC METABOLIC PANEL
Anion gap: 7 (ref 5–15)
BUN: 12 mg/dL (ref 8–23)
CO2: 28 mmol/L (ref 22–32)
Calcium: 7.9 mg/dL — ABNORMAL LOW (ref 8.9–10.3)
Chloride: 103 mmol/L (ref 98–111)
Creatinine, Ser: 1.06 mg/dL — ABNORMAL HIGH (ref 0.44–1.00)
GFR, Estimated: 54 mL/min — ABNORMAL LOW (ref >60)
Glucose, Bld: 92 mg/dL (ref 70–99)
Potassium: 3.2 mmol/L — ABNORMAL LOW (ref 3.5–5.1)
Sodium: 138 mmol/L (ref 135–145)

## 2023-03-16 MED ORDER — ALTEPLASE 2 MG IJ SOLR
2.0000 mg | Freq: Once | INTRAMUSCULAR | Status: AC
  Administered 2023-03-16: 2 mg
  Filled 2023-03-16: qty 2

## 2023-03-16 MED ORDER — POTASSIUM CHLORIDE CRYS ER 20 MEQ PO TBCR
40.0000 meq | EXTENDED_RELEASE_TABLET | Freq: Once | ORAL | Status: AC
  Administered 2023-03-16: 40 meq via ORAL
  Filled 2023-03-16: qty 2

## 2023-03-16 MED ORDER — STERILE WATER FOR INJECTION IJ SOLN
INTRAMUSCULAR | Status: AC
  Filled 2023-03-16: qty 10

## 2023-03-16 NOTE — TOC Progression Note (Signed)
Transition of Care [facility]) - Progression Note    Patient Details  Name: Crystal [name] MRN: [id number] Date of Birth: 07-06-1946  Transition of Care [facility]) CM/SW Contact  Beckie Busing, RN Phone [phone]  03/16/2023, 3:04 PM  Clinical Narrative:    CM continues to follow up with patient and sisters at bedside. CM has called [facility] for self pay rates per patient request. CM spoke with patient [name] at [facility] for rates. CM has updated patient and sisters that patient would need to pay 30 days upfront for self pay and the cost is estimated to be $22,000. Patients sister states that she can not afford to pay that price and that she will start looking for private caregivers.     Expected Discharge Plan: Home w Home Health Services Barriers to Discharge: Continued Medical Work up  Expected Discharge Plan and Services In-house Referral: NA Discharge Planning Services: CM Consult Post Acute Care Choice: NA Living arrangements for the past 2 months: Single Family Home                 DME Arranged: N/A         HH Arranged: Refused HH           Social Determinants of Health (SDOH) Interventions SDOH Screenings   Food Insecurity: No Food Insecurity (03/07/2023)  Housing: Low Risk  (03/07/2023)  Transportation Needs: No Transportation Needs (03/07/2023)  Utilities: Not At Risk (03/07/2023)  Depression (PHQ2-9): Low Risk  (03/02/2023)  Tobacco Use: Low Risk  (03/06/2023)    Readmission Risk Interventions    03/09/2023   12:06 PM  Readmission Risk Prevention Plan  Transportation Screening Complete  Medication Review (RN Care Manager) Complete  PCP or Specialist appointment within 3-5 days of discharge Complete  HRI or Home Care Consult Complete  SW Recovery Care/Counseling Consult Complete  Palliative Care Screening Not Applicable  Skilled Nursing Facility Not Applicable

## 2023-03-16 NOTE — Care Management Important Message (Signed)
Important Message  Patient Details IM Letter given. Name: Crystal [name] MRN: [id number] Date of Birth: Jan 17, 1946   Medicare Important Message Given:  Yes     [name] 03/16/2023, 12:05 PM
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
## 2023-03-16 NOTE — Progress Notes (Signed)
Physical Therapy Treatment Patient Details Name: Crystal [name] MRN: [id number] DOB: 27-Jul-1946 Today's Date: 03/16/2023   History of Present Illness Patient is a 77 year old female with history of non-Hodgkin's lymphoma, metastatic non-small cell lung cancer, hypertension, CKD stage IIIa, history of DVT/PE on Eliquis who was just discharged on 5/14 from here presented again 03/06/23  with a fall. MRI of the brain showed 4 mm subdural/subarachnoid hemorrhage in the anterior left falx.  Neurosurgery did not recommend any intervention.    PT Comments    Pt continues to present with nystagmus with positioning testing.  Pt ambulated in hallway and pt improved to only needing one seated rest break.  She also continues to remain a fall risk so post acute rehab d/c recommendation remains appropriate, but noting difficulty finding a bed.  If returns home would need frequent supervision and max home health services.    Vestibular: R Dix Hallpike performed: right upbeating rotary nystagmus 8 seconds L Dix Hallpike performed: left upbeating rotary nystagmus 8 seconds, pt reports symptoms worse on left Performed Epleys maneuver for L BPPV .   Recommendations for follow up therapy are one component of a multi-disciplinary discharge planning process, led by the attending physician.  Recommendations may be updated based on patient status, additional functional criteria and insurance authorization.  Follow Up Recommendations       Assistance Recommended at Discharge Frequent or constant Supervision/Assistance  Patient can return home with the following Assist for transportation;Assistance with cooking/housework;Help with stairs or ramp for entrance;A little help with walking and/or transfers;A little help with bathing/dressing/bathroom   Equipment Recommendations  Wheelchair cushion (measurements PT);Wheelchair (measurements PT)    Recommendations for Other Services       Precautions / Restrictions  Precautions Precautions: Fall Precaution Comments: monitor HR     Mobility  Bed Mobility Overal bed mobility: Needs Assistance Bed Mobility: Supine to Sit, Sit to Supine     Supine to sit: Min guard, HOB elevated Sit to supine: Min guard, HOB elevated        Transfers Overall transfer level: Needs assistance Equipment used: Rolling walker (2 wheels) Transfers: Sit to/from Stand Sit to Stand: Min assist           General transfer comment: light assist to steady with initial rise then min/guard, assist to control descent to bed; cues for hand placement on armrests to self assist (during rest breaks)    Ambulation/Gait Ambulation/Gait assistance: Min guard Gait Distance (Feet): 80 Feet Assistive device: Rolling walker (2 wheels) Gait Pattern/deviations: Step-through pattern, Trunk flexed, Decreased stride length       General Gait Details: cues for posture and RW positioning; performed 80'x1 and then 120'x1 with only one seated rest break required today, LE fatigue but tremors appeared improved today compared to yesterday; HR only up to 121 bpm (vs 130bpm yesterday)   Stairs             Wheelchair Mobility    Modified Rankin (Stroke Patients Only)       Balance                                            Cognition Arousal/Alertness: Awake/alert Behavior During Therapy: WFL for tasks assessed/performed Overall Cognitive Status: Within Functional Limits for tasks assessed  Exercises      General Comments        Pertinent Vitals/Pain Pain Assessment Pain Assessment: No/denies pain    Home Living                          Prior Function            PT Goals (current goals can now be found in the care plan section) Progress towards PT goals: Progressing toward goals    Frequency    Min 1X/week      PT Plan Current plan remains appropriate     Co-evaluation              AM-PAC PT "6 Clicks" Mobility   Outcome Measure  Help needed turning from your back to your side while in a flat bed without using bedrails?: A Little Help needed moving from lying on your back to sitting on the side of a flat bed without using bedrails?: A Little Help needed moving to and from a bed to a chair (including a wheelchair)?: A Little Help needed standing up from a chair using your arms (e.g., wheelchair or bedside chair)?: A Little Help needed to walk in hospital room?: A Little Help needed climbing 3-5 steps with a railing? : A Lot 6 Click Score: 17    End of Session Equipment Utilized During Treatment: Gait belt Activity Tolerance: Patient tolerated treatment well Patient left: in bed;with call bell/phone within reach;with bed alarm set;with family/visitor present   PT Visit Diagnosis: History of falling (Z91.81);Difficulty in walking, not elsewhere classified (R26.2);BPPV BPPV - Right/Left : Left     Time: 3086-5784 PT Time Calculation (min) (ACUTE ONLY): 27 min  Charges:  $Gait Training: 8-22 mins $Canalith Rep Proc: 8-22 mins                     Paulino Door, DPT Physical Therapist Acute Rehabilitation Services Office: [phone]    [name] 03/16/2023, 3:56 PM

## 2023-03-16 NOTE — Progress Notes (Signed)
Occupational Therapy Treatment Patient Details Name: Crystal [name] MRN: [id number] DOB: 1945-12-21 Today's Date: 03/16/2023   History of present illness Patient is a 77 year old female with history of non-Hodgkin's lymphoma, metastatic non-small cell lung cancer, hypertension, CKD stage IIIa, history of DVT/PE on Eliquis who was just discharged on 5/14 from here presented again 03/06/23  with a fall. MRI of the brain showed 4 mm subdural/subarachnoid hemorrhage in the anterior left falx.  Neurosurgery did not recommend any intervention.   OT comments  The pt presented with good effort and participation. The session emphasized ADL instruction and participation, with the pt progressing to performing toileting at bathroom level and grooming at sink level. She reported her legs feeling "wobbly" in standing, which she reported to be an ongoing issue for her. She also presented with some fatigue with progressive activity, requiring a seated rest break and cues for deep breathing. Without further OT services, she is at risk for restricted ADL participation and falls. Continue OT plan of care.    Recommendations for follow up therapy are one component of a multi-disciplinary discharge planning process, led by the attending physician.  Recommendations may be updated based on patient status, additional functional criteria and insurance authorization.    Assistance Recommended at Discharge Intermittent Supervision/Assistance  Patient can return home with the following  Assist for transportation;Assistance with cooking/housework;Help with stairs or ramp for entrance;A lot of help with bathing/dressing/bathroom;A little help with walking and/or transfers         Precautions / Restrictions Precautions Precautions: Fall Precaution Comments: monitor HR Restrictions Weight Bearing Restrictions: No Other Position/Activity Restrictions: dizziness with activity       Mobility Bed Mobility Overal bed  mobility: Needs Assistance Bed Mobility: Supine to Sit, Sit to Supine     Supine to sit: Min guard Sit to supine: Min guard        Transfers Overall transfer level: Needs assistance Equipment used: Rolling walker (2 wheels) Transfers: Sit to/from Stand Sit to Stand: Min assist                     ADL either performed or assessed with clinical judgement   ADL Overall ADL's : Needs assistance/impaired Eating/Feeding: Independent;Sitting   Grooming: Set up;Min guard;Sitting;Standing Grooming Details (indicate cue type and reason): Set-up seated at sink. Min guard standing at sink. Pt performed grooming in sitting, as well as standing at sink level, including face washing, hair combing, and teeth brushing. She required a seated rest break for added safety, given increased fatigue & BLE "tremors"         Upper Body Dressing : Set up;Sitting   Lower Body Dressing: Moderate assistance Lower Body Dressing Details (indicate cue type and reason): She was able to donn 1 sock seated EOB, however required assist to donn the other. She was instructed on implementing the figure 4 technique to perform, as she presents with increased dizziness when bending over to perform. Toilet Transfer: Minimal assistance;Rolling walker (2 wheels);Ambulation Toilet Transfer Details (indicate cue type and reason): She ambulated to the bathroom using a RW. She required intermittent cues for best walker placement, as well as use of grab bar as needed. Toileting- Clothing Manipulation and Hygiene: Minimal assistance Toileting - Clothing Manipulation Details (indicate cue type and reason): She performed seated posterior hygiene after bowel movement, with sitting emphasized for added safety, given her increased fatigue and feelings of BLE wobbliness in standing.  Cognition Arousal/Alertness: Awake/alert Behavior During Therapy: WFL for tasks assessed/performed Overall Cognitive Status:  Within Functional Limits for tasks assessed                        Pertinent Vitals/ Pain       Pain Assessment Pain Assessment: No/denies pain         Frequency  Min 1X/week        Progress Toward Goals  OT Goals(current goals can now be found in the care plan section)  Progress towards OT goals: Progressing toward goals  Acute Rehab OT Goals Patient Stated Goal: improvement in symptoms and to return home soon OT Goal Formulation: With patient Time For Goal Achievement: 03/22/23 Potential to Achieve Goals: Good  Plan Discharge plan remains appropriate       AM-PAC OT "6 Clicks" Daily Activity     Outcome Measure   Help from another person eating meals?: None Help from another person taking care of personal grooming?: A Little Help from another person toileting, which includes using toliet, bedpan, or urinal?: A Little Help from another person bathing (including washing, rinsing, drying)?: A Lot Help from another person to put on and taking off regular upper body clothing?: None Help from another person to put on and taking off regular lower body clothing?: A Lot 6 Click Score: 18    End of Session Equipment Utilized During Treatment: Rolling walker (2 wheels);Gait belt  OT Visit Diagnosis: Unsteadiness on feet (R26.81);Dizziness and giddiness (R42)   Activity Tolerance  (Fair+ tolerance)   Patient Left with call bell/phone within reach;in bed;with bed alarm set   Nurse Communication Mobility status        Time: 1610-9604 OT Time Calculation (min): 23 min  Charges: OT General Charges $OT Visit: 1 Visit OT Treatments $Self Care/Home Management : 8-22 mins $Therapeutic Activity: 8-22 mins     Reuben Likes, OTR/L 03/16/2023, 4:51 PM

## 2023-03-17 DIAGNOSIS — R42 Dizziness and giddiness: Secondary | ICD-10-CM | POA: Diagnosis not present

## 2023-03-17 DIAGNOSIS — R55 Syncope and collapse: Secondary | ICD-10-CM | POA: Diagnosis not present

## 2023-03-17 LAB — MAGNESIUM: Magnesium: 1.7 mg/dL (ref 1.7–2.4)

## 2023-03-17 LAB — BASIC METABOLIC PANEL
Anion gap: 5 (ref 5–15)
BUN: 13 mg/dL (ref 8–23)
CO2: 28 mmol/L (ref 22–32)
Calcium: 7.8 mg/dL — ABNORMAL LOW (ref 8.9–10.3)
Chloride: 106 mmol/L (ref 98–111)
Creatinine, Ser: 1.13 mg/dL — ABNORMAL HIGH (ref 0.44–1.00)
GFR, Estimated: 50 mL/min — ABNORMAL LOW (ref >60)
Glucose, Bld: 96 mg/dL (ref 70–99)
Potassium: 3.7 mmol/L (ref 3.5–5.1)
Sodium: 139 mmol/L (ref 135–145)

## 2023-03-17 NOTE — Progress Notes (Signed)
PROGRESS NOTE    Crystal [name]  ZOX:[id number] DOB: 23-May-1946 DOA: 03/06/2023 PCP: Blair Heys, MD    Brief Narrative:   Crystal [name] is a 77 y.o. female with past medical history significant for breast cancer, non-Hodgkin's lymphoma metastatic non-small cell lung cancer, HTN, CKD stage IIIa, history of DVT/PE on Eliquis, obesity who presented to [facility] ED on 5/14 from home via EMS for near syncopal episode.  Patient reported after returning home from the hospital she started having diarrhea and when she stood up to go to the restroom she started feeling increased dizziness, felt like she was going to pass out and slid to the floor, denies losing consciousness or striking head.  Patient also reports she feels the room is "spinning".    Just discharged same day for dizziness/palpitations likely secondary to beta-blocker withdrawal and restarted on atenolol.  Workup in the ED with temperature 98.7 F, HR 113, RR 20, BP 115/81, SpO2 93% on room air.  WBC 13.7, hemoglobin 8.4, platelets 84.  Sodium 135, potassium 3.1, chloride 101, CO2 26, glucose 146, BUN 9, creatinine 1.10.  AST 18, ALT 11, total bilirubin 0.4.  BNP 405.0.  High sensitive troponin 11.  Urinalysis with small leukocytes, negative nitrite, many bacteria, 6-10 WBCs.  Chest x-ray with low lung volumes, stable mild/moderate severity right middle lobe and right lower lobe atelectasis versus infiltrate.  EKG significant for PVCs and an incomplete left bundle branch block.  Patient received IV beta-blocker.  TRH consulted for admission for dizziness, presyncope, recurrent fall.  Assessment & Plan:    Orthostatic hypotension BPPV Recurrent fall Patient presenting to the ED after recurrent fall associated with dizziness.  Patient was noted to be orthostatic, positive for BPPV after PT evaluation.  Etiology likely multifactorial with orthostasis, vertigo; as well as sinus tachycardia.  Patient was started on IV fluid  and repeat orthostatic vital signs were negative on 5/18 and IV fluids were discontinued. -- Continue PT for vertigo/therapy treatment; currently recommending SNF placement -- Meclizine 25 mg p.o. 3 times daily as needed dizziness -- Fall precautions  Subdural/subarachnoid hemorrhage MRI brain performed for persistent dizziness notable for 4 mm subdural/subarachnoid hemorrhage in the anterior left falx.  Patient denied striking her head during fall at home but she does have notable area of bruising to her posterior scalp.  Neurosurgery was consulted with no recommendations regarding surgical intervention.  Patient's home Eliquis was held for 7 days now restarted.  Thrombocytopenia Normocytic anemia Positive FOBT, no reports of hematochezia or melena.  GI was consulted and recommended close monitoring with no plans for endoscopic evaluation.  Most likely secondary to effect of chemotherapy/malignancy. -- Hemoglobin stable, 9.8.   Sinus tachycardia Patient has been taking metoprolol for roughly 20 years but recently stopped due to concerns of hypotension on 03/02/2023.  Following discontinuation of beta-blockade, she developed palpitations.  EKG notable for sinus tachycardia and PVCs.  Suspected beta-blocker withdrawal.  She was then started on atenolol 12.5 mg twice daily but reported that she was unable to tolerate and was concerned this was attributing to her dizziness.  After discussion with cardiology, Dr. Herbie Baltimore she was restarted on metoprolol. -- Metoprolol tartrate 37.5 mg p.o. 3 times daily  Urinary retention During hospitalization, patient developed acute urinary retention.  Foley catheter was placed and started on tamsulosin.  Patient attempted voiding trial on 03/09/2023 which was unsuccessful and Foley reinserted. -- Tamsulosin 0.4 mg p.o. daily -- Continue Foley catheter for now, may consider repeat  voiding trial prior to discharge; if unsuccessful will need outpatient follow-up with  urology  Hypomagnesemia Hypokalemia Repleted.  Potassium 3.7, magnesium 1.7 this morning. -- Continue intermittent monitoring of electrolytes  Chronic systolic congestive heart failure Most recent TTE 01/17/2023 with LVEF 45-50%.  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  Not on diuretics at baseline, appears euvolemic.  Follows with cardiology, Dr. Herbie Baltimore outpatient.  History of DVT/PE: -- Restarted Eliquis 5 mg p.o. twice daily on 5/23  Chronic hypoxic respiratory failure Utilizes 2 L nasal cannula at home as needed.  Currently on room air with SpO2 97%.  CKD stage IIIa Baseline creatinine 1.1, stable.  Metastatic non-small cell lung cancer Follows with medical oncology outpatient, Dr. Truett Perna.  Recently completed cycle 3 of carboplatin/Taxol/atezolizumab on 02/22/2023.  Lung mass appears stable on recent imaging. --Continue outpatient follow-up with medical oncology  Obesity Body mass index is 33.46 kg/m.    DVT prophylaxis: Place and maintain sequential compression device Start: 03/12/23 1024 apixaban (ELIQUIS) tablet 5 mg    Code Status: DNR Family Communication: No family present at bedside this morning  Disposition Plan:  Level of care: Med-Surg Status is: Inpatient Remains inpatient appropriate because: Pending SNF placement; is alone high risk for recurrent falls    Consultants:  [facility] gastroenterology, Dr. Dulce Sellar Neurosurgery, Dr. Franky Macho Medical oncology  Procedures:  Foley catheter replaced 5/17  Antimicrobials:  Ceftriaxone 5/13 - 5/15, 5/18 Azithromycin 5/15 - 5/16   Subjective: Patient seen examined bedside, resting comfortably, lying in bed.  No specific complaints this morning.  Awaiting placement; may be difficult per TOC due to lack of options.  No family present bedside this morning.  No other questions or concerns at this time.  Denies headache, no current dizziness, no chest pain, no palpitations, no shortness of breath, no  abdominal pain, no fever/chills/night sweats, no nausea/vomiting/diarrhea.  No acute concerns overnight per nursing staff.  Objective: Vitals:   03/16/23 1355 03/16/23 2143 03/17/23 0525 03/17/23 1333  BP: 106/71 108/69 130/76 120/78  Pulse: (!) 101 98 (!) 102 (!) 102  Resp: 18 18 18 18   Temp: 98.4 F (36.9 C) 98.1 F (36.7 C) 98.2 F (36.8 C) 98.5 F (36.9 C)  TempSrc: Oral Oral Oral Oral  SpO2: 95% 95% 97% 92%  Weight:      Height:        Intake/Output Summary (Last 24 hours) at 03/17/2023 1417 Last data filed at 03/17/2023 0920 Gross per 24 hour  Intake 480 ml  Output 925 ml  Net -445 ml   Filed Weights   03/06/23 2137  Weight: 91.2 kg    Examination:  Physical Exam: GEN: NAD, alert and oriented x 3, obese HEENT: NCAT, PERRL, EOMI, sclera clear, MMM PULM: CTAB w/o wheezes/crackles, normal respiratory effort CV: Tachycardic, regular rhythm w/o M/G/R GI: abd soft, NTND, NABS, no R/G/M MSK: no peripheral edema, muscle strength globally intact 5/5 bilateral upper/lower extremities NEURO: CN II-XII intact, no focal deficits, sensation to light touch intact PSYCH: normal mood/affect Integumentary: Noted area of ecchymosis posterior scalp, otherwise no other concerning rashes/lesions/wounds noted on exposed skin surfaces     Data Reviewed: I have personally reviewed following labs and imaging studies  CBC: Recent Labs  Lab 03/11/23 0424 03/13/23 0535 03/16/23 0500  WBC 7.0 6.6 5.3  HGB 8.9* 9.2* 9.8*  HCT 28.8* 28.9* 30.8*  MCV 100.0 99.3 100.3*  PLT 118* 137* 164   Basic Metabolic Panel: Recent Labs  Lab 03/11/23 0424 03/13/23 0535 03/13/23 1610 03/14/23 9604 03/16/23  0500 03/17/23 0511  NA 139 136  --  137 138 139  K 3.7 2.9*  --  3.6 3.2* 3.7  CL 105 102  --  105 103 106  CO2 28 27  --  27 28 28   GLUCOSE 91 94  --  97 92 96  BUN 7* 10  --  11 12 13   CREATININE 1.01* 1.10*  --  0.99 1.06* 1.13*  CALCIUM 7.3* 7.2*  --  7.5* 7.9* 7.8*  MG  --    --  1.2* 2.1  --  1.7   GFR: Estimated Creatinine Clearance: 46.5 mL/min (A) (by C-G formula based on SCr of 1.13 mg/dL (H)). Liver Function Tests: No results for input(s): "AST", "ALT", "ALKPHOS", "BILITOT", "PROT", "ALBUMIN" in the last 168 hours. No results for input(s): "LIPASE", "AMYLASE" in the last 168 hours. No results for input(s): "AMMONIA" in the last 168 hours. Coagulation Profile: No results for input(s): "INR", "PROTIME" in the last 168 hours. Cardiac Enzymes: No results for input(s): "CKTOTAL", "CKMB", "CKMBINDEX", "TROPONINI" in the last 168 hours. BNP (last 3 results) No results for input(s): "PROBNP" in the last 8760 hours. HbA1C: No results for input(s): "HGBA1C" in the last 72 hours. CBG: No results for input(s): "GLUCAP" in the last 168 hours. Lipid Profile: No results for input(s): "CHOL", "HDL", "LDLCALC", "TRIG", "CHOLHDL", "LDLDIRECT" in the last 72 hours. Thyroid Function Tests: No results for input(s): "TSH", "T4TOTAL", "FREET4", "T3FREE", "THYROIDAB" in the last 72 hours. Anemia Panel: No results for input(s): "VITAMINB12", "FOLATE", "FERRITIN", "TIBC", "IRON", "RETICCTPCT" in the last 72 hours. Sepsis Labs: No results for input(s): "PROCALCITON", "LATICACIDVEN" in the last 168 hours.  No results found for this or any previous visit (from the past 240 hour(s)).       Radiology Studies: No results found.      Scheduled Meds:  apixaban  5 mg Oral BID   Chlorhexidine Gluconate Cloth  6 each Topical Daily   ferrous sulfate  325 mg Oral Q breakfast   folic acid  1 mg Oral Daily   magnesium oxide  400 mg Oral BID   metoprolol tartrate  37.5 mg Oral TID   pantoprazole  40 mg Oral BID   sodium chloride flush  10-40 mL Intracatheter Q12H   tamsulosin  0.4 mg Oral Daily   Continuous Infusions:   LOS: 10 days    Time spent: 51 minutes spent on chart review, discussion with nursing staff, consultants, updating family and interview/physical exam;  more than 50% of that time was spent in counseling and/or coordination of care.    Alvira Philips [place], DO [facility] Available via Epic secure chat 7am-7pm After these hours, please refer to coverage provider listed on [email or web address] 03/17/2023, 2:17 PM

## 2023-03-17 NOTE — Progress Notes (Signed)
Physical Therapy Treatment Patient Details Name: Crystal [name] MRN: [id number] DOB: 1946/02/16 Today's Date: 03/17/2023   History of Present Illness Patient is a 77 year old female with history of non-Hodgkin's lymphoma, metastatic non-small cell lung cancer, hypertension, CKD stage IIIa, history of DVT/PE on Eliquis who was just discharged on 5/14 from here presented again 03/06/23  with a fall. MRI of the brain showed 4 mm subdural/subarachnoid hemorrhage in the anterior left falx.  Neurosurgery did not recommend any intervention.    PT Comments    Pt with persistent nystagmus with bil Crystal [name].  Pt agreeable to canalith repositioning technique again today.  Pt also agreeable for OOB however felt unable to ambulate today due to "wobbly" LEs which she did present with increased tremors from previous visits.  Continue to recommend post acute rehab as pt continues to present as HIGH fall risk. Vestibular: R Crystal [name] performed: right upbeating rotary nystagmus 6 seconds, denies symptoms L Crystal [name] performed: left upbeating rotary nystagmus 6 seconds, pt reports symptoms worse on left Performed Epleys maneuver for L BPPV.    Recommendations for follow up therapy are one component of a multi-disciplinary discharge planning process, led by the attending physician.  Recommendations may be updated based on patient status, additional functional criteria and insurance authorization.  Follow Up Recommendations  Can patient physically be transported by private vehicle: Yes    Assistance Recommended at Discharge Frequent or constant Supervision/Assistance  Patient can return home with the following Assist for transportation;Assistance with cooking/housework;Help with stairs or ramp for entrance;A little help with walking and/or transfers;A little help with bathing/dressing/bathroom   Equipment Recommendations  Wheelchair cushion (measurements PT);Wheelchair (measurements PT)     Recommendations for Other Services       Precautions / Restrictions Precautions Precautions: Fall Precaution Comments: monitor HR     Mobility  Bed Mobility Overal bed mobility: Needs Assistance Bed Mobility: Supine to Sit     Supine to sit: Min guard          Transfers Overall transfer level: Needs assistance Equipment used: Rolling walker (2 wheels) Transfers: Sit to/from Stand Sit to Stand: Mod assist   Step pivot transfers: Mod assist       General transfer comment: increased assist to rise and steady today, pt with worse LE tremors and unable to ambulate, pt did take a few steps over to recliner with assist for stabilizing; pt attempted to stand again from recliner to try to walk but once standing felt her legs would not cooperate so returned to sitting; HR up to 133 bpm    Ambulation/Gait                   Stairs             Wheelchair Mobility    Modified Rankin (Stroke Patients Only)       Balance                                            Cognition Arousal/Alertness: Awake/alert Behavior During Therapy: WFL for tasks assessed/performed Overall Cognitive Status: Within Functional Limits for tasks assessed                                          Exercises  General Comments        Pertinent Vitals/Pain Pain Assessment Pain Assessment: No/denies pain    Home Living                          Prior Function            PT Goals (current goals can now be found in the care plan section) Progress towards PT goals: Progressing toward goals    Frequency    Min 1X/week      PT Plan Current plan remains appropriate    Co-evaluation              AM-PAC PT "6 Clicks" Mobility   Outcome Measure  Help needed turning from your back to your side while in a flat bed without using bedrails?: A Little Help needed moving from lying on your back to sitting on the side  of a flat bed without using bedrails?: A Little Help needed moving to and from a bed to a chair (including a wheelchair)?: A Little Help needed standing up from a chair using your arms (e.g., wheelchair or bedside chair)?: A Lot Help needed to walk in hospital room?: A Lot Help needed climbing 3-5 steps with a railing? : A Lot 6 Click Score: 15    End of Session Equipment Utilized During Treatment: Gait belt Activity Tolerance: Patient tolerated treatment well Patient left: in chair;with chair alarm set;with call bell/phone within reach;with family/visitor present   PT Visit Diagnosis: History of falling (Z91.81);Difficulty in walking, not elsewhere classified (R26.2);BPPV BPPV - Right/Left : Left     Time: 9147-8295 PT Time Calculation (min) (ACUTE ONLY): 23 min  Charges:  $Therapeutic Activity: 8-22 mins $Canalith Rep Proc: 8-22 mins                    Crystal [name], DPT Physical Therapist Acute Rehabilitation Services Office: [phone]    Crystal [name] 03/17/2023, 4:23 PM

## 2023-03-18 ENCOUNTER — Other Ambulatory Visit: Payer: Self-pay

## 2023-03-18 DIAGNOSIS — R55 Syncope and collapse: Secondary | ICD-10-CM | POA: Diagnosis not present

## 2023-03-18 DIAGNOSIS — R42 Dizziness and giddiness: Secondary | ICD-10-CM | POA: Diagnosis not present

## 2023-03-18 NOTE — Progress Notes (Signed)
PROGRESS NOTE    Crystal [name]  VWU:[id number] DOB: [birth year] DOA: 03/06/2023 PCP: Blair Heys, MD    Brief Narrative:   Crystal [name] is a 77 y.o. female with past medical history significant for breast cancer, non-Hodgkin's lymphoma metastatic non-small cell lung cancer, HTN, CKD stage IIIa, history of DVT/PE on Eliquis, obesity who presented to [facility] ED on 5/14 from home via EMS for near syncopal episode.  Patient reported after returning home from the hospital she started having diarrhea and when she stood up to go to the restroom she started feeling increased dizziness, felt like she was going to pass out and slid to the floor, denies losing consciousness or striking head.  Patient also reports she feels the room is "spinning".    Just discharged same day for dizziness/palpitations likely secondary to beta-blocker withdrawal and restarted on atenolol.  Workup in the ED with temperature 98.7 F, HR 113, RR 20, BP 115/81, SpO2 93% on room air.  WBC 13.7, hemoglobin 8.4, platelets 84.  Sodium 135, potassium 3.1, chloride 101, CO2 26, glucose 146, BUN 9, creatinine 1.10.  AST 18, ALT 11, total bilirubin 0.4.  BNP 405.0.  High sensitive troponin 11.  Urinalysis with small leukocytes, negative nitrite, many bacteria, 6-10 WBCs.  Chest x-ray with low lung volumes, stable mild/moderate severity right middle lobe and right lower lobe atelectasis versus infiltrate.  EKG significant for PVCs and an incomplete left bundle branch block.  Patient received IV beta-blocker.  TRH consulted for admission for dizziness, presyncope, recurrent fall.  Assessment & Plan:    Orthostatic hypotension BPPV Recurrent fall Patient presenting to the ED after recurrent fall associated with dizziness.  Patient was noted to be orthostatic, positive for BPPV after PT evaluation.  Etiology likely multifactorial with orthostasis, vertigo; as well as sinus tachycardia.  Patient was started on IV fluid  and repeat orthostatic vital signs were negative on 5/18 and IV fluids were discontinued. -- Continue PT for vertigo/therapy treatment; currently recommending SNF placement -- Meclizine 25 mg p.o. 3 times daily as needed dizziness -- Fall precautions  Subdural/subarachnoid hemorrhage MRI brain performed for persistent dizziness notable for 4 mm subdural/subarachnoid hemorrhage in the anterior left falx.  Patient denied striking her head during fall at home but she does have notable area of bruising to her posterior scalp.  Neurosurgery was consulted with no recommendations regarding surgical intervention.  Patient's home Eliquis was held for 7 days now restarted.  Thrombocytopenia Normocytic anemia Positive FOBT, no reports of hematochezia or melena.  GI was consulted and recommended close monitoring with no plans for endoscopic evaluation.  Most likely secondary to effect of chemotherapy/malignancy. -- Hemoglobin stable, 9.8.   Sinus tachycardia Patient has been taking metoprolol for roughly 20 years but recently stopped due to concerns of hypotension on 03/02/2023.  Following discontinuation of beta-blockade, she developed palpitations.  EKG notable for sinus tachycardia and PVCs.  Suspected beta-blocker withdrawal.  She was then started on atenolol 12.5 mg twice daily but reported that she was unable to tolerate and was concerned this was attributing to her dizziness.  After discussion with cardiology, Dr. Herbie Baltimore she was restarted on metoprolol. -- Metoprolol tartrate 37.5 mg p.o. 3 times daily  Urinary retention During hospitalization, patient developed acute urinary retention.  Foley catheter was placed and started on tamsulosin.  Patient attempted voiding trial on 03/09/2023 which was unsuccessful and Foley reinserted. -- Tamsulosin 0.4 mg p.o. daily -- Will attempt repeat voiding trial today, following Foley  catheter removal bladder scan ordered after next void or if no void in 6  hours  Hypomagnesemia Hypokalemia Repleted.  Potassium 3.7, magnesium 1.7 this morning. -- Continue intermittent monitoring of electrolytes  Chronic systolic congestive heart failure Most recent TTE 01/17/2023 with LVEF 45-50%.  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  Not on diuretics at baseline, appears euvolemic.  Follows with cardiology, Dr. Herbie Baltimore outpatient.  History of DVT/PE: -- Restarted Eliquis 5 mg p.o. twice daily on 5/23  Chronic hypoxic respiratory failure Utilizes 2 L nasal cannula at home as needed.  Currently on room air with SpO2 97%.  CKD stage IIIa Baseline creatinine 1.1, stable.  Metastatic non-small cell lung cancer Follows with medical oncology outpatient, Dr. Truett Perna.  Recently completed cycle 3 of carboplatin/Taxol/atezolizumab on 02/22/2023.  Lung mass appears stable on recent imaging. --Continue outpatient follow-up with medical oncology  Obesity Body mass index is 33.46 kg/m.    DVT prophylaxis: Place and maintain sequential compression device Start: 03/12/23 1024 apixaban (ELIQUIS) tablet 5 mg    Code Status: DNR Family Communication: No family present at bedside this morning  Disposition Plan:  Level of care: Med-Surg Status is: Inpatient Remains inpatient appropriate because: Pending SNF placement; resides home alone and is high risk for recurrent falls    Consultants:  [facility] gastroenterology, Dr. Dulce Sellar Neurosurgery, Dr. Franky Macho Medical oncology  Procedures:  Foley catheter replaced 5/17  Antimicrobials:  Ceftriaxone 5/13 - 5/15, 5/18 Azithromycin 5/15 - 5/16   Subjective: Patient seen examined bedside, resting comfortably, lying in bed.  No specific complaints this morning.  Reports dizziness is improving.  Awaiting placement; may be difficult per TOC due to lack of options.  No family present bedside this morning.  No other questions or concerns at this time.  Denies headache, no current dizziness, no chest pain, no  palpitations, no shortness of breath, no abdominal pain, no fever/chills/night sweats, no nausea/vomiting/diarrhea.  No acute concerns overnight per nursing staff.  Objective: Vitals:   03/17/23 0525 03/17/23 1333 03/17/23 2150 03/18/23 0557  BP: 130/76 120/78 111/72 116/80  Pulse: (!) 102 (!) 102 (!) 101 93  Resp: 18 18 16 18   Temp: 98.2 F (36.8 C) 98.5 F (36.9 C) 98 F (36.7 C) 97.9 F (36.6 C)  TempSrc: Oral Oral Oral Oral  SpO2: 97% 92% 91% 95%  Weight:      Height:        Intake/Output Summary (Last 24 hours) at 03/18/2023 1213 Last data filed at 03/18/2023 0936 Gross per 24 hour  Intake 240 ml  Output 550 ml  Net -310 ml   Filed Weights   03/06/23 2137  Weight: 91.2 kg    Examination:  Physical Exam: GEN: NAD, alert and oriented x 3, obese HEENT: NCAT, PERRL, EOMI, sclera clear, MMM PULM: CTAB w/o wheezes/crackles, normal respiratory effort CV: Tachycardic, regular rhythm w/o M/G/R GI: abd soft, NTND, NABS, no R/G/M MSK: no peripheral edema, muscle strength globally intact 5/5 bilateral upper/lower extremities NEURO: CN II-XII intact, no focal deficits, sensation to light touch intact PSYCH: normal mood/affect Integumentary: Noted area of ecchymosis posterior scalp, otherwise no other concerning rashes/lesions/wounds noted on exposed skin surfaces     Data Reviewed: I have personally reviewed following labs and imaging studies  CBC: Recent Labs  Lab 03/13/23 0535 03/16/23 0500  WBC 6.6 5.3  HGB 9.2* 9.8*  HCT 28.9* 30.8*  MCV 99.3 100.3*  PLT 137* 164   Basic Metabolic Panel: Recent Labs  Lab 03/13/23 0535 03/13/23 1610 03/14/23 9604  03/16/23 0500 03/17/23 0511  NA 136  --  137 138 139  K 2.9*  --  3.6 3.2* 3.7  CL 102  --  105 103 106  CO2 27  --  27 28 28   GLUCOSE 94  --  97 92 96  BUN 10  --  11 12 13   CREATININE 1.10*  --  0.99 1.06* 1.13*  CALCIUM 7.2*  --  7.5* 7.9* 7.8*  MG  --  1.2* 2.1  --  1.7   GFR: Estimated Creatinine  Clearance: 46.5 mL/min (A) (by C-G formula based on SCr of 1.13 mg/dL (H)). Liver Function Tests: No results for input(s): "AST", "ALT", "ALKPHOS", "BILITOT", "PROT", "ALBUMIN" in the last 168 hours. No results for input(s): "LIPASE", "AMYLASE" in the last 168 hours. No results for input(s): "AMMONIA" in the last 168 hours. Coagulation Profile: No results for input(s): "INR", "PROTIME" in the last 168 hours. Cardiac Enzymes: No results for input(s): "CKTOTAL", "CKMB", "CKMBINDEX", "TROPONINI" in the last 168 hours. BNP (last 3 results) No results for input(s): "PROBNP" in the last 8760 hours. HbA1C: No results for input(s): "HGBA1C" in the last 72 hours. CBG: No results for input(s): "GLUCAP" in the last 168 hours. Lipid Profile: No results for input(s): "CHOL", "HDL", "LDLCALC", "TRIG", "CHOLHDL", "LDLDIRECT" in the last 72 hours. Thyroid Function Tests: No results for input(s): "TSH", "T4TOTAL", "FREET4", "T3FREE", "THYROIDAB" in the last 72 hours. Anemia Panel: No results for input(s): "VITAMINB12", "FOLATE", "FERRITIN", "TIBC", "IRON", "RETICCTPCT" in the last 72 hours. Sepsis Labs: No results for input(s): "PROCALCITON", "LATICACIDVEN" in the last 168 hours.  No results found for this or any previous visit (from the past 240 hour(s)).       Radiology Studies: No results found.      Scheduled Meds:  apixaban  5 mg Oral BID   Chlorhexidine Gluconate Cloth  6 each Topical Daily   ferrous sulfate  325 mg Oral Q breakfast   folic acid  1 mg Oral Daily   magnesium oxide  400 mg Oral BID   metoprolol tartrate  37.5 mg Oral TID   pantoprazole  40 mg Oral BID   sodium chloride flush  10-40 mL Intracatheter Q12H   tamsulosin  0.4 mg Oral Daily   Continuous Infusions:   LOS: 11 days    Time spent: 51 minutes spent on chart review, discussion with nursing staff, consultants, updating family and interview/physical exam; more than 50% of that time was spent in counseling  and/or coordination of care.    Alvira Philips [place], DO [facility] Available via Epic secure chat 7am-7pm After these hours, please refer to coverage provider listed on [email or web address] 03/18/2023, 12:13 PM

## 2023-03-18 NOTE — Plan of Care (Signed)
Patient voided , Post void residual showed . MD [place] notified.  Problem: Education: Goal: Knowledge of General Education information will improve Description: Including pain rating scale, medication(s)/side effects and non-pharmacologic comfort measures Outcome: Progressing   Problem: Health Behavior/Discharge Planning: Goal: Ability to manage health-related needs will improve Outcome: Progressing   Problem: Clinical Measurements: Goal: Ability to maintain clinical measurements within normal limits will improve Outcome: Progressing Goal: Will remain free from infection Outcome: Progressing Goal: Diagnostic test results will improve Outcome: Progressing Goal: Respiratory complications will improve Outcome: Progressing Goal: Cardiovascular complication will be avoided Outcome: Progressing   Problem: Activity: Goal: Risk for activity intolerance will decrease Outcome: Progressing   Problem: Nutrition: Goal: Adequate nutrition will be maintained Outcome: Progressing   Problem: Coping: Goal: Level of anxiety will decrease Outcome: Progressing   Problem: Elimination: Goal: Will not experience complications related to bowel motility Outcome: Progressing Goal: Will not experience complications related to urinary retention Outcome: Progressing   Problem: Pain Managment: Goal: General experience of comfort will improve Outcome: Progressing   Problem: Safety: Goal: Ability to remain free from injury will improve Outcome: Progressing   Problem: Skin Integrity: Goal: Risk for impaired skin integrity will decrease Outcome: Progressing

## 2023-03-19 DIAGNOSIS — R42 Dizziness and giddiness: Secondary | ICD-10-CM | POA: Diagnosis not present

## 2023-03-19 DIAGNOSIS — R55 Syncope and collapse: Secondary | ICD-10-CM | POA: Diagnosis not present

## 2023-03-19 NOTE — Progress Notes (Signed)
Inpatient Rehab Admissions Coordinator:  ° °Patient was screened for CIR candidacy by Adelard Sanon, MS, CCC-SLP. At this time, Pt. Appears to be a a potential candidate for CIR. I will place  order for rehab consult per protocol for full assessment. Please contact me any with questions. ° °Markeem Noreen, MS, CCC-SLP °Rehab Admissions Coordinator  °[phone] (celll) °[phone] (office) ° °

## 2023-03-19 NOTE — Progress Notes (Signed)
PROGRESS NOTE    Crystal [name]  OZH:[id number] DOB: [birth year] DOA: 03/06/2023 PCP: Blair Heys, MD    Brief Narrative:   Crystal [name] is a 77 y.o. female with past medical history significant for breast cancer, non-Hodgkin's lymphoma metastatic non-small cell lung cancer, HTN, CKD stage IIIa, history of DVT/PE on Eliquis, obesity who presented to [facility] ED on 5/14 from home via EMS for near syncopal episode.  Patient reported after returning home from the hospital she started having diarrhea and when she stood up to go to the restroom she started feeling increased dizziness, felt like she was going to pass out and slid to the floor, denies losing consciousness or striking head.  Patient also reports she feels the room is "spinning".    Just discharged same day for dizziness/palpitations likely secondary to beta-blocker withdrawal and restarted on atenolol.  Workup in the ED with temperature 98.7 F, HR 113, RR 20, BP 115/81, SpO2 93% on room air.  WBC 13.7, hemoglobin 8.4, platelets 84.  Sodium 135, potassium 3.1, chloride 101, CO2 26, glucose 146, BUN 9, creatinine 1.10.  AST 18, ALT 11, total bilirubin 0.4.  BNP 405.0.  High sensitive troponin 11.  Urinalysis with small leukocytes, negative nitrite, many bacteria, 6-10 WBCs.  Chest x-ray with low lung volumes, stable mild/moderate severity right middle lobe and right lower lobe atelectasis versus infiltrate.  EKG significant for PVCs and an incomplete left bundle branch block.  Patient received IV beta-blocker.  TRH consulted for admission for dizziness, presyncope, recurrent fall.  Assessment & Plan:    Orthostatic hypotension BPPV Recurrent fall Patient presenting to the ED after recurrent fall associated with dizziness.  Patient was noted to be orthostatic, positive for BPPV after PT evaluation.  Etiology likely multifactorial with orthostasis, vertigo; as well as sinus tachycardia.  Patient was started on IV fluid  and repeat orthostatic vital signs were negative on 5/18 and IV fluids were discontinued. -- Continue PT for vertigo/therapy treatment; currently recommending SNF placement -- Meclizine 25 mg p.o. 3 times daily as needed dizziness -- Fall precautions -- Repeat orthostatic vital signs today  Subdural/subarachnoid hemorrhage MRI brain performed for persistent dizziness notable for 4 mm subdural/subarachnoid hemorrhage in the anterior left falx.  Patient denied striking her head during fall at home but she does have notable area of bruising to her posterior scalp.  Neurosurgery was consulted with no recommendations regarding surgical intervention.  Patient's home Eliquis was held for 7 days now restarted.  Thrombocytopenia Normocytic anemia Positive FOBT, no reports of hematochezia or melena.  GI was consulted and recommended close monitoring with no plans for endoscopic evaluation.  Most likely secondary to effect of chemotherapy/malignancy. -- Hemoglobin stable, 9.8.   Sinus tachycardia Patient has been taking metoprolol for roughly 20 years but recently stopped due to concerns of hypotension on 03/02/2023.  Following discontinuation of beta-blockade, she developed palpitations.  EKG notable for sinus tachycardia and PVCs.  Suspected beta-blocker withdrawal.  She was then started on atenolol 12.5 mg twice daily but reported that she was unable to tolerate and was concerned this was attributing to her dizziness.  After discussion with cardiology, Dr. Herbie Baltimore she was restarted on metoprolol. -- Metoprolol tartrate 37.5 mg p.o. 3 times daily  Urinary retention During hospitalization, patient developed acute urinary retention.  Foley catheter was placed and started on tamsulosin.  Patient attempted voiding trial on 03/09/2023 which was unsuccessful and Foley reinserted. -- Tamsulosin 0.4 mg p.o. daily -- Foley catheter  removed on 5/26, continues to void without issue -- Continue to monitor urine  output closely  Hypomagnesemia Hypokalemia Repleted.  Potassium 3.7, magnesium 1.7 this morning. -- Continue intermittent monitoring of electrolytes  Chronic systolic congestive heart failure Most recent TTE 01/17/2023 with LVEF 45-50%.  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  Not on diuretics at baseline, appears euvolemic.  Follows with cardiology, Dr. Herbie Baltimore outpatient.  History of DVT/PE: -- Restarted Eliquis 5 mg p.o. twice daily on 5/23  Chronic hypoxic respiratory failure Utilizes 2 L nasal cannula at home as needed.  Currently on room air with SpO2 97%.  CKD stage IIIa Baseline creatinine 1.1, stable.  Metastatic non-small cell lung cancer Follows with medical oncology outpatient, Dr. Truett Perna.  Recently completed cycle 3 of carboplatin/Taxol/atezolizumab on 02/22/2023.  Lung mass appears stable on recent imaging. --Continue outpatient follow-up with medical oncology  Obesity Body mass index is 33.46 kg/m.    DVT prophylaxis: Place and maintain sequential compression device Start: 03/12/23 1024 apixaban (ELIQUIS) tablet 5 mg    Code Status: DNR Family Communication: No family present at bedside this morning, updated multiple family members at bedside yesterday afternoon  Disposition Plan:  Level of care: Med-Surg Status is: Inpatient Remains inpatient appropriate because: Pending evaluation by CIR; resides home alone and is high risk for recurrent falls    Consultants:  [facility] gastroenterology, Dr. Dulce Sellar Neurosurgery, Dr. Franky Macho Medical oncology  Procedures:  Foley catheter replaced 5/17  Antimicrobials:  Ceftriaxone 5/13 - 5/15, 5/18 Azithromycin 5/15 - 5/16   Subjective: Patient seen examined bedside, resting comfortably, lying in bed.  No specific complaints this morning.  No dizziness this morning.  Continues to void following Foley catheter removal yesterday.  CIR currently reviewing for possible admission.   No family present bedside  this morning.  No other questions or concerns at this time.  Denies headache, no current dizziness, no chest pain, no palpitations, no shortness of breath, no abdominal pain, no fever/chills/night sweats, no nausea/vomiting/diarrhea.  No acute concerns overnight per nursing staff.  Objective: Vitals:   03/18/23 1407 03/18/23 2029 03/19/23 0539 03/19/23 1242  BP: 122/79 103/66 124/80 117/74  Pulse: 100 (!) 101 96 98  Resp: 18 18 18 16   Temp: 98.2 F (36.8 C) 98.2 F (36.8 C) 98.1 F (36.7 C) 98.2 F (36.8 C)  TempSrc: Oral Oral Oral Oral  SpO2: 93% 94% 95% 97%  Weight:      Height:        Intake/Output Summary (Last 24 hours) at 03/19/2023 1334 Last data filed at 03/19/2023 1044 Gross per 24 hour  Intake 600 ml  Output 150 ml  Net 450 ml   Filed Weights   03/06/23 2137  Weight: 91.2 kg    Examination:  Physical Exam: GEN: NAD, alert and oriented x 3, obese HEENT: NCAT, PERRL, EOMI, sclera clear, MMM PULM: CTAB w/o wheezes/crackles, normal respiratory effort CV: Tachycardic, regular rhythm w/o M/G/R GI: abd soft, NTND, NABS, no R/G/M MSK: no peripheral edema, muscle strength globally intact 5/5 bilateral upper/lower extremities NEURO: CN II-XII intact, no focal deficits, sensation to light touch intact PSYCH: normal mood/affect Integumentary: Noted area of ecchymosis posterior scalp, otherwise no other concerning rashes/lesions/wounds noted on exposed skin surfaces     Data Reviewed: I have personally reviewed following labs and imaging studies  CBC: Recent Labs  Lab 03/13/23 0535 03/16/23 0500  WBC 6.6 5.3  HGB 9.2* 9.8*  HCT 28.9* 30.8*  MCV 99.3 100.3*  PLT 137* 164   Basic Metabolic  Panel: Recent Labs  Lab 03/13/23 0535 03/13/23 0838 03/14/23 0613 03/16/23 0500 03/17/23 0511  NA 136  --  137 138 139  K 2.9*  --  3.6 3.2* 3.7  CL 102  --  105 103 106  CO2 27  --  27 28 28   GLUCOSE 94  --  97 92 96  BUN 10  --  11 12 13   CREATININE 1.10*  --   0.99 1.06* 1.13*  CALCIUM 7.2*  --  7.5* 7.9* 7.8*  MG  --  1.2* 2.1  --  1.7   GFR: Estimated Creatinine Clearance: 46.5 mL/min (A) (by C-G formula based on SCr of 1.13 mg/dL (H)). Liver Function Tests: No results for input(s): "AST", "ALT", "ALKPHOS", "BILITOT", "PROT", "ALBUMIN" in the last 168 hours. No results for input(s): "LIPASE", "AMYLASE" in the last 168 hours. No results for input(s): "AMMONIA" in the last 168 hours. Coagulation Profile: No results for input(s): "INR", "PROTIME" in the last 168 hours. Cardiac Enzymes: No results for input(s): "CKTOTAL", "CKMB", "CKMBINDEX", "TROPONINI" in the last 168 hours. BNP (last 3 results) No results for input(s): "PROBNP" in the last 8760 hours. HbA1C: No results for input(s): "HGBA1C" in the last 72 hours. CBG: No results for input(s): "GLUCAP" in the last 168 hours. Lipid Profile: No results for input(s): "CHOL", "HDL", "LDLCALC", "TRIG", "CHOLHDL", "LDLDIRECT" in the last 72 hours. Thyroid Function Tests: No results for input(s): "TSH", "T4TOTAL", "FREET4", "T3FREE", "THYROIDAB" in the last 72 hours. Anemia Panel: No results for input(s): "VITAMINB12", "FOLATE", "FERRITIN", "TIBC", "IRON", "RETICCTPCT" in the last 72 hours. Sepsis Labs: No results for input(s): "PROCALCITON", "LATICACIDVEN" in the last 168 hours.  No results found for this or any previous visit (from the past 240 hour(s)).       Radiology Studies: No results found.      Scheduled Meds:  apixaban  5 mg Oral BID   Chlorhexidine Gluconate Cloth  6 each Topical Daily   ferrous sulfate  325 mg Oral Q breakfast   folic acid  1 mg Oral Daily   magnesium oxide  400 mg Oral BID   metoprolol tartrate  37.5 mg Oral TID   pantoprazole  40 mg Oral BID   sodium chloride flush  10-40 mL Intracatheter Q12H   tamsulosin  0.4 mg Oral Daily   Continuous Infusions:   LOS: 12 days    Time spent: 51 minutes spent on chart review, discussion with nursing staff,  consultants, updating family and interview/physical exam; more than 50% of that time was spent in counseling and/or coordination of care.    Alvira Philips [place], DO [facility] Available via Epic secure chat 7am-7pm After these hours, please refer to coverage provider listed on [email or web address] 03/19/2023, 1:34 PM

## 2023-03-19 NOTE — Progress Notes (Signed)
Physical Therapy Treatment Patient Details Name: Crystal [name] MRN: [id number] DOB: 04-05-1946 Today's Date: 03/19/2023   History of Present Illness Patient is a 77 year old female with history of non-Hodgkin's lymphoma, metastatic non-small cell lung cancer, hypertension, CKD stage IIIa, history of DVT/PE on Eliquis who was just discharged on 5/14 from here presented again 03/06/23  with a fall. MRI of the brain showed 4 mm subdural/subarachnoid hemorrhage in the anterior left falx.  Neurosurgery did not recommend any intervention.    PT Comments    Pt making gradual progress with improving exercise tolerance.  Her HR up to 120's with activity today.  Tried to take orthostatic vitals , but question accuracy as pt's arm very tense leading to higher diastolic pressure. Pt ambulating 60' x2 and 30'x2 with seated rest breaks.  Worked on symptom/endurance monitoring and tried rollator vs RW.  Continue to recommend RW as unsteady with rollator.  She has leg tremor with static stand but improves with ambulation (sister and pt report this has been true since the 1980's).  Do continue to recommend post acute rehab due to need for assist and decreased safety/endurance.  Pt with improving tolerance for activity - updated recommendations as pt will benefit from intensive inpatient follow up therapy, >3 hours/day   In regards to BPPV treatment.  Pt denies any spinning sensations with rolling or transfers.  Does get lightheaded with sitting but improves after a few seconds.  Did perform [facility] and pt with minimal nystagmus and symptoms bil sides.  Held on further treatment today as pt asymptomatic.  Will continue to further monitor.   Recommendations for follow up therapy are one component of a multi-disciplinary discharge planning process, led by the attending physician.  Recommendations may be updated based on patient status, additional functional criteria and insurance authorization.  Follow Up  Recommendations  Can patient physically be transported by private vehicle: Yes    Assistance Recommended at Discharge Frequent or constant Supervision/Assistance  Patient can return home with the following Assist for transportation;Assistance with cooking/housework;Help with stairs or ramp for entrance;A little help with walking and/or transfers;A little help with bathing/dressing/bathroom   Equipment Recommendations  Wheelchair cushion (measurements PT);Wheelchair (measurements PT)    Recommendations for Other Services       Precautions / Restrictions Precautions Precautions: Fall Precaution Comments: monitor HR     Mobility  Bed Mobility Overal bed mobility: Needs Assistance Bed Mobility: Supine to Sit, Sit to Supine     Supine to sit: Min guard, HOB elevated Sit to supine: Min guard, HOB elevated   General bed mobility comments: Min guard and increased time    Transfers Overall transfer level: Needs assistance Equipment used: Rolling walker (2 wheels) Transfers: Sit to/from Stand Sit to Stand: Min assist, Mod assist           General transfer comment: Performed x7 during session.  At times required 2 attempts. Pt with good hand placement and leans forward to stand.  From bed/chair was min A but from rollator mod A to steady.    Ambulation/Gait Ambulation/Gait assistance: Min guard, Min assist Gait Distance (Feet): 60 Feet (60'x2, 30'x2) Assistive device: Rolling walker (2 wheels), Rollator (4 wheels) Gait Pattern/deviations: Step-through pattern, Trunk flexed       General Gait Details: Performed 60'x2 with RW and min guard.  Pt fatigued easily and required 2-3 min seated rest breaks.  Improved RW proximity with min cues to control speed as she approrached chair.  Then performed 30'x2  with rollator to allow pt to have seated rest breaks in hallway ; however, increased instability requiring min A. She required cues to "park" rollator on wall and on how to lock  brakes.  With return to standing requiring mod A to stabilize and very unstable turning back toward rollator.  Advised continued use of RW until further training with rollator.   Stairs             Wheelchair Mobility    Modified Rankin (Stroke Patients Only)       Balance Overall balance assessment: Needs assistance, History of Falls Sitting-balance support: No upper extremity supported Sitting balance-Leahy Scale: Good     Standing balance support: Bilateral upper extremity supported, During functional activity, Reliant on assistive device for balance Standing balance-Leahy Scale: Poor Standing balance comment: Requiring bil UE support - RW to stand, with turns to rollator using wall or HHA when turning                            Cognition Arousal/Alertness: Awake/alert Behavior During Therapy: WFL for tasks assessed/performed Overall Cognitive Status: Within Functional Limits for tasks assessed                                 General Comments: Very pleasant and motivated        Exercises      General Comments   Vestibular:  [employer]: Right: 5 sec upward rotational nystagmus with minimal symptoms Left: 2 beat upward rotational nystagmus with no symptoms     Pertinent Vitals/Pain Pain Assessment Pain Assessment: No/denies pain    Home Living                          Prior Function            PT Goals (current goals can now be found in the care plan section) Progress towards PT goals: Progressing toward goals    Frequency    Min 1X/week      PT Plan Discharge plan needs to be updated    Co-evaluation              AM-PAC PT "6 Clicks" Mobility   Outcome Measure  Help needed turning from your back to your side while in a flat bed without using bedrails?: A Little Help needed moving from lying on your back to sitting on the side of a flat bed without using bedrails?: A Little Help needed moving  to and from a bed to a chair (including a wheelchair)?: A Little Help needed standing up from a chair using your arms (e.g., wheelchair or bedside chair)?: A Lot Help needed to walk in hospital room?: A Lot Help needed climbing 3-5 steps with a railing? : A Lot 6 Click Score: 15    End of Session Equipment Utilized During Treatment: Gait belt Activity Tolerance: Patient tolerated treatment well Patient left: in chair;with chair alarm set;with call bell/phone within reach;with family/visitor present Nurse Communication: Mobility status PT Visit Diagnosis: History of falling (Z91.81);Difficulty in walking, not elsewhere classified (R26.2);BPPV BPPV - Right/Left : Left     Time: 1137-1220 PT Time Calculation (min) (ACUTE ONLY): 43 min  Charges:  $Gait Training: 23-37 mins $Therapeutic Activity: 8-22 mins  Anise Salvo, PT Acute Rehab [facility] Rehab [phone]    Crystal [name] 03/19/2023, 1:27 PM

## 2023-03-19 NOTE — TOC Progression Note (Signed)
Transition of Care [facility]) - Progression Note    Patient Details  Name: Crystal [name] MRN: [id number] Date of Birth: 1945-10-27  Transition [facility]) CM/SW Contact  Princella Ion, LCSW Phone Number: 03/19/2023, 2:33 PM  Clinical Narrative:    This CSW was contacted by [facility] staff informing of screen for CIR. PT/OT rec AIR. CIR screening pt to see if she would be eligible. Lauren, SLP, reports insurance is in network with them. OT will need to see pt again tomorrow. Attempted to contact pt's sister to inquire about willingness to pursue. Left HIPAA Compliant voicemail requesting a call back.    Expected Discharge Plan: Home w Home Health Services Barriers to Discharge: Continued Medical Work up  Expected Discharge Plan and Services In-house Referral: NA Discharge Planning Services: CM Consult Post Acute Care Choice: NA Living arrangements for the past 2 months: Single Family Home                 DME Arranged: N/A         HH Arranged: Refused HH           Social Determinants of Health (SDOH) Interventions SDOH Screenings   Food Insecurity: No Food Insecurity (03/07/2023)  Housing: Low Risk  (03/07/2023)  Transportation Needs: No Transportation Needs (03/07/2023)  Utilities: Not At Risk (03/07/2023)  Depression (PHQ2-9): Low Risk  (03/02/2023)  Tobacco Use: Low Risk  (03/06/2023)    Readmission Risk Interventions    03/09/2023   12:06 PM  Readmission Risk Prevention Plan  Transportation Screening Complete  Medication Review (RN Care Manager) Complete  PCP or Specialist appointment within 3-5 days of discharge Complete  HRI or Home Care Consult Complete  SW Recovery Care/Counseling Consult Complete  Palliative Care Screening Not Applicable  Skilled Nursing Facility Not Applicable

## 2023-03-19 NOTE — Care Management Important Message (Signed)
Important Message  Patient Details IM Letter given. Name: ULLANDA CLOWNEY MRN: [id number] Date of Birth: 1946-04-18   Medicare Important Message Given:  Yes     [name] 03/19/2023, 10:59 AM

## 2023-03-20 DIAGNOSIS — R55 Syncope and collapse: Secondary | ICD-10-CM | POA: Diagnosis not present

## 2023-03-20 DIAGNOSIS — R42 Dizziness and giddiness: Secondary | ICD-10-CM | POA: Diagnosis not present

## 2023-03-20 NOTE — TOC Progression Note (Signed)
Transition of Care [facility]) - Progression Note    Patient Details  Name: Crystal [name] MRN: [id number] Date of Birth: 1946-06-05  [facility]) CM/SW Contact  Beckie Busing, RN Phone [phone]  03/20/2023, 3:33 PM  Clinical Narrative:    CM at bedside to update patient on plan and discuss backup plan if patient is not accepted for CIR.  CM has asked patient for permission to speak with her niece Aundra Millet. Patient gives verbal consent for CM to speak with niece.   CM called niece Tommi Rumps to discuss disposition planning. Aundra Millet states that she is looking at short term options but also wants to have a backup plan for after short term rehab. CM has made niece aware that referral is for short term and that patients insurance will not cover long term care. CM has made niece aware that she will need to explore long term options via private pay if the patient doesn't have long term care. Niece verbalized understanding. Niece also requested that CM fax out bed request for [place]. CM  has extended bed search. TOC will continue to follow.    Expected Discharge Plan: Home w Home Health Services Barriers to Discharge: Continued Medical Work up  Expected Discharge Plan and Services In-house Referral: NA Discharge Planning Services: CM Consult Post Acute Care Choice: NA Living arrangements for the past 2 months: Single Family Home                 DME Arranged: N/A         HH Arranged: Refused HH           Social Determinants of Health (SDOH) Interventions SDOH Screenings   Food Insecurity: No Food Insecurity (03/07/2023)  Housing: Low Risk  (03/07/2023)  Transportation Needs: No Transportation Needs (03/07/2023)  Utilities: Not At Risk (03/07/2023)  Depression (PHQ2-9): Low Risk  (03/02/2023)  Tobacco Use: Low Risk  (03/06/2023)    Readmission Risk Interventions    03/09/2023   12:06 PM  Readmission Risk Prevention Plan  Transportation Screening Complete   Medication Review (RN Care Manager) Complete  PCP or Specialist appointment within 3-5 days of discharge Complete  HRI or Home Care Consult Complete  SW Recovery Care/Counseling Consult Complete  Palliative Care Screening Not Applicable  Skilled Nursing Facility Not Applicable

## 2023-03-20 NOTE — Progress Notes (Signed)
PROGRESS NOTE    Crystal [name]  UEA:[id number] DOB: [birth year] DOA: 03/06/2023 PCP: Blair Heys, MD    Brief Narrative:   Crystal [name] is a 77 y.o. female with past medical history significant for breast cancer, non-Hodgkin's lymphoma metastatic non-small cell lung cancer, HTN, CKD stage IIIa, history of DVT/PE on Eliquis, obesity who presented to [facility] ED on 5/14 from home via EMS for near syncopal episode.  Patient reported after returning home from the hospital she started having diarrhea and when she stood up to go to the restroom she started feeling increased dizziness, felt like she was going to pass out and slid to the floor, denies losing consciousness or striking head.  Patient also reports she feels the room is "spinning".    Just discharged same day for dizziness/palpitations likely secondary to beta-blocker withdrawal and restarted on atenolol.  Workup in the ED with temperature 98.7 F, HR 113, RR 20, BP 115/81, SpO2 93% on room air.  WBC 13.7, hemoglobin 8.4, platelets 84.  Sodium 135, potassium 3.1, chloride 101, CO2 26, glucose 146, BUN 9, creatinine 1.10.  AST 18, ALT 11, total bilirubin 0.4.  BNP 405.0.  High sensitive troponin 11.  Urinalysis with small leukocytes, negative nitrite, many bacteria, 6-10 WBCs.  Chest x-ray with low lung volumes, stable mild/moderate severity right middle lobe and right lower lobe atelectasis versus infiltrate.  EKG significant for PVCs and an incomplete left bundle branch block.  Patient received IV beta-blocker.  TRH consulted for admission for dizziness, presyncope, recurrent fall.  Assessment & Plan:    Orthostatic hypotension BPPV Recurrent fall Patient presenting to the ED after recurrent fall associated with dizziness.  Patient was noted to be orthostatic, positive for BPPV after PT evaluation.  Etiology likely multifactorial with orthostasis, vertigo; as well as sinus tachycardia.  Patient was started on IV fluid  and repeat orthostatic vital signs were negative on 5/18 and IV fluids were discontinued. -- Continue PT for vertigo/therapy treatment; currently recommending CIR placement -- Meclizine 25 mg p.o. 3 times daily as needed dizziness -- Fall precautions  Subdural/subarachnoid hemorrhage MRI brain performed for persistent dizziness notable for 4 mm subdural/subarachnoid hemorrhage in the anterior left falx.  Patient denied striking her head during fall at home but she does have notable area of bruising to her posterior scalp.  Neurosurgery was consulted with no recommendations regarding surgical intervention.  Patient's home Eliquis was held for 7 days now restarted.  Thrombocytopenia Normocytic anemia Positive FOBT, no reports of hematochezia or melena.  GI was consulted and recommended close monitoring with no plans for endoscopic evaluation.  Most likely secondary to effect of chemotherapy/malignancy. -- Hemoglobin stable, 9.8.  Sinus tachycardia Patient has been taking metoprolol for roughly 20 years but recently stopped due to concerns of hypotension on 03/02/2023.  Following discontinuation of beta-blockade, she developed palpitations.  EKG notable for sinus tachycardia and PVCs.  Suspected beta-blocker withdrawal.  She was then started on atenolol 12.5 mg twice daily but reported that she was unable to tolerate and was concerned this was attributing to her dizziness.  After discussion with cardiology, Dr. Herbie Baltimore she was restarted on metoprolol. -- Metoprolol tartrate 37.5 mg p.o. 3 times daily  Urinary retention During hospitalization, patient developed acute urinary retention.  Foley catheter was placed and started on tamsulosin.  Patient attempted voiding trial on 03/09/2023 which was unsuccessful and Foley reinserted. -- Tamsulosin 0.4 mg p.o. daily -- Foley catheter removed on 5/26, continues to void without  issue -- Continue to monitor urine output  closely  Hypomagnesemia Hypokalemia Repleted.  Potassium 3.7, magnesium 1.7 this morning. -- Continue intermittent monitoring of electrolytes  Chronic systolic congestive heart failure Most recent TTE 01/17/2023 with LVEF 45-50%.  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  Not on diuretics at baseline, appears euvolemic.  Follows with cardiology, Dr. Herbie Baltimore outpatient.  History of DVT/PE: -- Restarted Eliquis 5 mg p.o. twice daily on 5/23  Chronic hypoxic respiratory failure Utilizes 2 L nasal cannula at home as needed.  Currently on room air with SpO2 97%.  CKD stage IIIa Baseline creatinine 1.1, stable.  Metastatic non-small cell lung cancer Follows with medical oncology outpatient, Dr. Truett Perna.  Recently completed cycle 3 of carboplatin/Taxol/atezolizumab on 02/22/2023.  Lung mass appears stable on recent imaging. --Continue outpatient follow-up with medical oncology  Obesity Body mass index is 33.46 kg/m.    DVT prophylaxis: Place and maintain sequential compression device Start: 03/12/23 1024 apixaban (ELIQUIS) tablet 5 mg    Code Status: DNR Family Communication: No family present at bedside this morning  Disposition Plan:  Level of care: Med-Surg Status is: Inpatient Remains inpatient appropriate because: Pending insurance authorization for CIR    Consultants:  [facility], Dr. Dulce Sellar Neurosurgery, Dr. Franky Macho Medical oncology  Procedures:  Foley catheter replaced 5/17  Antimicrobials:  Ceftriaxone 5/13 - 5/15, 5/18 Azithromycin 5/15 - 5/16   Subjective: Patient seen examined bedside, resting comfortably, lying in bed.  No specific complaints this morning.  No dizziness this morning.  Continues to void following Foley catheter removal yesterday.  CIR currently reviewing for possible admission.   No family present bedside this morning.  No other questions or concerns at this time.  Denies headache, no current dizziness, no chest pain, no  palpitations, no shortness of breath, no abdominal pain, no fever/chills/night sweats, no nausea/vomiting/diarrhea.  No acute concerns overnight per nursing staff.  Awaiting insurance authorization for CIR admission.  Objective: Vitals:   03/19/23 1242 03/19/23 2052 03/20/23 0517 03/20/23 1205  BP: 117/74 107/72 116/81 106/79  Pulse: 98 (!) 102 94 94  Resp: 16 18 18 18   Temp: 98.2 F (36.8 C) 98.9 F (37.2 C) 98 F (36.7 C) 97.9 F (36.6 C)  TempSrc: Oral Oral Oral Oral  SpO2: 97% 93% 95% 98%  Weight:      Height:        Intake/Output Summary (Last 24 hours) at 03/20/2023 1225 Last data filed at 03/20/2023 0900 Gross per 24 hour  Intake 120 ml  Output --  Net 120 ml   Filed Weights   03/06/23 2137  Weight: 91.2 kg    Examination:  Physical Exam: GEN: NAD, alert and oriented x 3, obese HEENT: NCAT, PERRL, EOMI, sclera clear, MMM PULM: CTAB w/o wheezes/crackles, normal respiratory effort CV: Tachycardic, regular rhythm w/o M/G/R GI: abd soft, NTND, NABS, no R/G/M MSK: no peripheral edema, muscle strength globally intact 5/5 bilateral upper/lower extremities NEURO: CN II-XII intact, no focal deficits, sensation to light touch intact PSYCH: normal mood/affect Integumentary: Noted area of ecchymosis posterior scalp, otherwise no other concerning rashes/lesions/wounds noted on exposed skin surfaces     Data Reviewed: I have personally reviewed following labs and imaging studies  CBC: Recent Labs  Lab 03/16/23 0500  WBC 5.3  HGB 9.8*  HCT 30.8*  MCV 100.3*  PLT 164   Basic Metabolic Panel: Recent Labs  Lab 03/14/23 0613 03/16/23 0500 03/17/23 0511  NA 137 138 139  K 3.6 3.2* 3.7  CL 105 103  106  CO2 27 28 28   GLUCOSE 97 92 96  BUN 11 12 13   CREATININE 0.99 1.06* 1.13*  CALCIUM 7.5* 7.9* 7.8*  MG 2.1  --  1.7   GFR: Estimated Creatinine Clearance: 46.5 mL/min (A) (by C-G formula based on SCr of 1.13 mg/dL (H)). Liver Function Tests: No results for  input(s): "AST", "ALT", "ALKPHOS", "BILITOT", "PROT", "ALBUMIN" in the last 168 hours. No results for input(s): "LIPASE", "AMYLASE" in the last 168 hours. No results for input(s): "AMMONIA" in the last 168 hours. Coagulation Profile: No results for input(s): "INR", "PROTIME" in the last 168 hours. Cardiac Enzymes: No results for input(s): "CKTOTAL", "CKMB", "CKMBINDEX", "TROPONINI" in the last 168 hours. BNP (last 3 results) No results for input(s): "PROBNP" in the last 8760 hours. HbA1C: No results for input(s): "HGBA1C" in the last 72 hours. CBG: No results for input(s): "GLUCAP" in the last 168 hours. Lipid Profile: No results for input(s): "CHOL", "HDL", "LDLCALC", "TRIG", "CHOLHDL", "LDLDIRECT" in the last 72 hours. Thyroid Function Tests: No results for input(s): "TSH", "T4TOTAL", "FREET4", "T3FREE", "THYROIDAB" in the last 72 hours. Anemia Panel: No results for input(s): "VITAMINB12", "FOLATE", "FERRITIN", "TIBC", "IRON", "RETICCTPCT" in the last 72 hours. Sepsis Labs: No results for input(s): "PROCALCITON", "LATICACIDVEN" in the last 168 hours.  No results found for this or any previous visit (from the past 240 hour(s)).       Radiology Studies: No results found.      Scheduled Meds:  apixaban  5 mg Oral BID   Chlorhexidine Gluconate Cloth  6 each Topical Daily   ferrous sulfate  325 mg Oral Q breakfast   folic acid  1 mg Oral Daily   magnesium oxide  400 mg Oral BID   metoprolol tartrate  37.5 mg Oral TID   pantoprazole  40 mg Oral BID   sodium chloride flush  10-40 mL Intracatheter Q12H   tamsulosin  0.4 mg Oral Daily   Continuous Infusions:   LOS: 13 days    Time spent: 51 minutes spent on chart review, discussion with nursing staff, consultants, updating family and interview/physical exam; more than 50% of that time was spent in counseling and/or coordination of care.    Alvira Philips [place], DO [facility] Available via Epic secure chat  7am-7pm After these hours, please refer to coverage provider listed on [email or web address] 03/20/2023, 12:25 PM

## 2023-03-20 NOTE — Progress Notes (Signed)
Upon review of pt's chart, pt not receiving scheduled IV medication and last dose of PRN IV medication was 5/15.  Please consider discontinuing PICC line. Secure chat sent to primary RN and Dr. [place].

## 2023-03-20 NOTE — Progress Notes (Signed)
[facility] PHYSICAL MEDICINE AND REHABILITATION  CONSULT SERVICE NOTE   Chart reviewed. Discussed with rehab admission coordinator. This is a 77 yo fmeale with hx of breast ca, NHL, metastatic NSCLC who presented on 5/14 with dizziness/syncope, diarrhea. W/u notable for anterior falcine subdural/subarachnoid hemorrhage. Pt also orthostatic, +BPPV. NS consulted and recommended conservative mgt. Pt also with sinus tachycardia and urine retention. Pt now with significant deconditioning. Vertigo seems to have improved. Pt transferred yesterday with min-mod assist. She was able to ambulate at min assist 60' x 2 using RW with HR/endurance limiting.   Given the above, I feel the patient would benefit from an inpatient rehab stay to maximize her functional mobility and self-care while managing her ongoing medical issues. ELOS 7-10 days. Goals supervision supervision.   Rehab Admissions Coordinator to follow up   Ranelle Oyster, MD, [facility] Physical Medicine & Rehabilitation Medical Director Rehabilitation Services 03/20/2023

## 2023-03-20 NOTE — Progress Notes (Signed)
Mobility Specialist - Progress Note   03/20/23 1348  Mobility  Activity Ambulated with assistance in hallway  Level of Assistance Contact guard assist, steadying assist  Assistive Device Front wheel walker  Distance Ambulated (ft) 160 ft  Range of Motion/Exercises Active  Activity Response Tolerated well  Mobility Referral Yes  $Mobility charge 1 Mobility  Mobility Specialist Start Time (ACUTE ONLY) 1335  Mobility Specialist Stop Time (ACUTE ONLY) 1348  Mobility Specialist Time Calculation (min) (ACUTE ONLY) 13 min   Pt received in bed and agreed to mobility, took one seated rest break nearing midway point to combat fatigue. Returned to bed with all needs met and alarm on and family in room.  Jaycey Gens Christell Constant Mobility Specialist ,

## 2023-03-20 NOTE — Progress Notes (Signed)
Occupational Therapy Treatment Patient Details Name: Crystal [name] MRN: [id number] DOB: 27-Feb-1946 Today's Date: 03/20/2023   History of present illness Patient is a 77 year old female with history of non-Hodgkin's lymphoma, metastatic non-small cell lung cancer, hypertension, CKD stage IIIa, history of DVT/PE on Eliquis who was just discharged on 5/14 from here presented again 03/06/23  with a fall. MRI of the brain showed 4 mm subdural/subarachnoid hemorrhage in the anterior left falx.  Neurosurgery did not recommend any intervention.   OT comments  Patient progressing towards OT goals.  She completes toilet transfers with min assist, toileting with min guard and LB dressing with min assist.  She continues to require cueing for safety with RW mgmt, educated on safety during ADLs.  Pt reports lightheadedness that improves after positional changes but no dizziness today.  Recommend inpatient rehab setting with >3hrs/day to optimize independence, safety and return to PLOF.  Will follow.    Recommendations for follow up therapy are one component of a multi-disciplinary discharge planning process, led by the attending physician.  Recommendations may be updated based on patient status, additional functional criteria and insurance authorization.    Assistance Recommended at Discharge Intermittent Supervision/Assistance  Patient can return home with the following  Assist for transportation;Assistance with cooking/housework;Help with stairs or ramp for entrance;A little help with walking and/or transfers;A little help with bathing/dressing/bathroom   Equipment Recommendations  None recommended by OT    Recommendations for Other Services      Precautions / Restrictions Precautions Precautions: Fall Precaution Comments: monitor HR Restrictions Weight Bearing Restrictions: No       Mobility Bed Mobility Overal bed mobility: Needs Assistance Bed Mobility: Supine to Sit     Supine to sit: Min  guard, HOB elevated     General bed mobility comments: min guard for safety    Transfers Overall transfer level: Needs assistance Equipment used: Rolling walker (2 wheels) Transfers: Sit to/from Stand Sit to Stand: Min assist           General transfer comment: to power up and steady, cueing for hand placement and safety     Balance Overall balance assessment: Needs assistance, History of Falls Sitting-balance support: No upper extremity supported Sitting balance-Leahy Scale: Good     Standing balance support: Bilateral upper extremity supported, During functional activity Standing balance-Leahy Scale: Poor Standing balance comment: relies on BUE support                           ADL either performed or assessed with clinical judgement   ADL Overall ADL's : Needs assistance/impaired     Grooming: Set up;Sitting           Upper Body Dressing : Set up;Sitting   Lower Body Dressing: Minimal assistance;Sit to/from stand Lower Body Dressing Details (indicate cue type and reason): figure 4 technique for B socks with min guard, standing with min assist Toilet Transfer: Minimal assistance;Ambulation;Rolling walker (2 wheels) Toilet Transfer Details (indicate cue type and reason): to commode, cueing for safety with RW mgmt and grab bar safety Toileting- Clothing Manipulation and Hygiene: Min guard;Sit to/from stand;Sitting/lateral lean       Functional mobility during ADLs: Minimal assistance;Rolling walker (2 wheels);Cueing for safety General ADL Comments: pt reports lightheaded at EOB but no dizziness today    Extremity/Trunk Assessment              Vision       Perception  Praxis      Cognition Arousal/Alertness: Awake/alert Behavior During Therapy: WFL for tasks assessed/performed Overall Cognitive Status: Within Functional Limits for tasks assessed                                 General Comments: Very pleasant and  motivated        Exercises      Shoulder Instructions       General Comments      Pertinent Vitals/ Pain       Pain Assessment Pain Assessment: No/denies pain  Home Living                                          Prior Functioning/Environment              Frequency  Min 1X/week        Progress Toward Goals  OT Goals(current goals can now be found in the care plan section)  Progress towards OT goals: Progressing toward goals  Acute Rehab OT Goals Patient Stated Goal: get to rehab OT Goal Formulation: With patient Time For Goal Achievement: 03/22/23 Potential to Achieve Goals: Good  Plan Frequency remains appropriate;Discharge plan needs to be updated    Co-evaluation                 AM-PAC OT "6 Clicks" Daily Activity     Outcome Measure   Help from another person eating meals?: None Help from another person taking care of personal grooming?: A Little Help from another person toileting, which includes using toliet, bedpan, or urinal?: A Little Help from another person bathing (including washing, rinsing, drying)?: A Little Help from another person to put on and taking off regular upper body clothing?: A Lot Help from another person to put on and taking off regular lower body clothing?: A Lot 6 Click Score: 17    End of Session Equipment Utilized During Treatment: Rolling walker (2 wheels);Gait belt  OT Visit Diagnosis: Unsteadiness on feet (R26.81);Dizziness and giddiness (R42)   Activity Tolerance Patient tolerated treatment well   Patient Left with call bell/phone within reach;with bed alarm set;in chair;with chair alarm set   Nurse Communication Mobility status        Time: 2130-8657 OT Time Calculation (min): 17 min  Charges: OT General Charges $OT Visit: 1 Visit OT Treatments $Self Care/Home Management : 8-22 mins  Barry Brunner, OT Acute Rehabilitation Services Office [phone]   Chancy Milroy 03/20/2023, 12:05 PM

## 2023-03-20 NOTE — Progress Notes (Signed)
Inpatient Rehabilitation Admissions Coordinator   I spoke with patient's niece, Aundra Millet, [place], by phone. I also spoke with sisters, [name] and Erskine Squibb. [name] has provided 24/7 supervision since Thanksgiving 2023. If patient can reach supervision level, she can provide that level of care. Aundra Millet prefers CIR for rehab and then home with [name] at supervision level. Aundra Millet would also request TOC to check with SNF rehabs that are in network with her [facility] plan as a backup if needed. Aundra Millet has researched online to be [facility], [facility], and other facilities in [place], [place] and [place]. She requests TOC contact her to discuss back up rehab options. I will begin insurance Auth with [employer] for possible Cir admit.  Ottie Glazier, RN, MSN Rehab Admissions Coordinator [phone] 03/20/2023 11:21 AM

## 2023-03-20 NOTE — Progress Notes (Signed)
Inpatient Rehabilitation Admissions Coordinator   I have left a voicemail for patient's sister, Erskine Squibb, to discuss rehab venue options. Also note Dr Rosalyn Charters recommendations and that I will follow up with patient and family with these recommendations.  Ottie Glazier, RN, MSN Rehab Admissions Coordinator [phone] 03/20/2023 10:31 AM

## 2023-03-20 NOTE — PMR Pre-admission (Signed)
PMR Admission Coordinator Pre-Admission Assessment  Patient: Crystal [name] is an 77 y.o., female MRN: [id number] DOB: 03-Jul-1946 Height: 5\' 5"  (165.1 cm) Weight: 91.2 kg  Insurance Information HMO: yes    PPO:      PCP:      IPA:      80/20:      OTHER: medicare advantage plan PRIMARY: [employer]      Policy#: [id number]      Subscriber: pt CM Name: R[place] ?      Phone#: [phone] ext [phone]     Fax#: [phone] Pre-Cert#: [id number]      Employer:  Benefits:  Phone #: [phone]     Name: 5/28 Eff. Date: 10/23/22     Deduct: none      Out of Pocket Max: $3600       CIR: $295 co pay per day days 1 until 5      SNF: no copay per day days 1 until 20; $203 co pay per day days 21 until 100 Outpatient: $15 per visit     Co-Pay:  Home Health: 100%      Co-Pay:  DME: 80%     Co-Pay: 20% Providers: in network; limited network  SECONDARY: none      Policy#:      Phone#:   Artist:       Phone#:   The Data processing manager" for patients in Inpatient Rehabilitation Facilities with attached "Privacy Act Statement-Health Care Records" was provided and verbally reviewed with: Patient and Family  Emergency Contact Information Contact Information     Name Relation Home Work Mobile   [place] Sister   [phone]   Tommi Rumps Niece   [phone]   [place] Morita   [phone]      Current Medical History  Patient Admitting Diagnosis: Debility  History of Present Illness: ***    Patient's medical record from [facility] has been reviewed by the rehabilitation admission coordinator and physician.  Past Medical History  Past Medical History:  Diagnosis Date   Anxiety    Atherosclerosis of aorta (HCC)    Bloodstream infection due to Port-A-Cath 01/15/2023   Breast cancer (HCC) 1998   (Rt) lumpectomy dx 1999; Dr. Truett Perna   Dyslipidemia, goal LDL below 130    Essential hypertension    GERD (gastroesophageal reflux disease)     Hypercholesterolemia    IBS (irritable bowel syndrome)    Lymphoma (HCC)    lymphoma dx 11/28/10 - left neck   MSSA bacteremia 01/15/2023   non hodgkins lymphoma 12/2010   right aprotid gland   Nonischemic cardiomyopathy (HCC) 2008   ? Doxorubincin induced; essentially resolved as of echo in January 2014, current EF 50-55%. Grade 1 diastolic dysfunction.   Obesity    Severe obesity (BMI >= 40) (HCC) 05/21/2013   Improve to BMI of 39 by July 2015   Tremor    Has the patient had major surgery during 100 days prior to admission? Yes  Family History   family history includes Cancer in her paternal grandmother and sister; Chronic Renal Failure in her mother; Heart Problems in her father, maternal grandmother, mother, and paternal grandfather; Heart attack in her sister; Hypertension in her brother, sister, and sister; Lupus in her brother; Migraines in her sister; Stroke in her maternal grandfather.  Current Medications  Current Facility-Administered Medications:    acetaminophen (TYLENOL) tablet 650 mg, 650 mg, Oral, Q6H PRN, Reva Bores, MD   apixaban (ELIQUIS) tablet 5 mg, 5  mg, Oral, BID, Burnadette Pop, MD, 5 mg at 03/20/23 1610   Chlorhexidine Gluconate Cloth 2 % PADS 6 each, 6 each, Topical, Daily, Burnadette Pop, MD, 6 each at 03/20/23 9604   ferrous sulfate tablet 325 mg, 325 mg, Oral, Q breakfast, Adhikari, Amrit, MD, 325 mg at 03/20/23 5409   folic acid (FOLVITE) tablet 1 mg, 1 mg, Oral, Daily, Reva Bores, MD, 1 mg at 03/20/23 8119   LORazepam (ATIVAN) tablet 0.5 mg, 0.5 mg, Oral, BID PRN, Reva Bores, MD, 0.5 mg at 03/19/23 2144   magnesium oxide (MAG-OX) tablet 400 mg, 400 mg, Oral, BID, Reva Bores, MD, 400 mg at 03/20/23 1478   meclizine (ANTIVERT) tablet 25 mg, 25 mg, Oral, TID PRN, Burnadette Pop, MD   metoprolol tartrate (LOPRESSOR) injection 5 mg, 5 mg, Intravenous, Q6H PRN, Reva Bores, MD   metoprolol tartrate (LOPRESSOR) tablet 37.5 mg, 37.5 mg, Oral,  TID, Adhikari, Amrit, MD, 37.5 mg at 03/20/23 1619   ondansetron (ZOFRAN) tablet 4 mg, 4 mg, Oral, Q6H PRN **OR** ondansetron (ZOFRAN) injection 4 mg, 4 mg, Intravenous, Q6H PRN, Reva Bores, MD, 4 mg at 03/07/23 0225   oxyCODONE (Oxy IR/ROXICODONE) immediate release tablet 5 mg, 5 mg, Oral, Q6H PRN, Reva Bores, MD   pantoprazole (PROTONIX) EC tablet 40 mg, 40 mg, Oral, BID, Reva Bores, MD, 40 mg at 03/20/23 2956   polyethylene glycol (MIRALAX / GLYCOLAX) packet 17 g, 17 g, Oral, Daily PRN, Reva Bores, MD   sodium chloride flush (NS) 0.9 % injection 10-40 mL, 10-40 mL, Intracatheter, Q12H, Adhikari, Amrit, MD, 10 mL at 03/20/23 2130   sodium chloride flush (NS) 0.9 % injection 10-40 mL, 10-40 mL, Intracatheter, PRN, Burnadette Pop, MD   tamsulosin (FLOMAX) capsule 0.4 mg, 0.4 mg, Oral, Daily, Adhikari, Amrit, MD, 0.4 mg at 03/20/23 8657  Patients Current Diet:  Diet Order             Diet regular Room service appropriate? Yes; Fluid consistency: Thin  Diet effective now                  Precautions / Restrictions Precautions Precautions: Fall Precaution Comments: monitor HR Restrictions Weight Bearing Restrictions: No Other Position/Activity Restrictions: dizziness with activity   Has the patient had 2 or more falls or a fall with injury in the past year? Yes  Prior Activity Level Limited Community (1-2x/wk): mod I wiht RW, supervision with showering, Mod I with ADLS  Prior Functional Level Self Care: Did the patient need help bathing, dressing, using the toilet or eating? Needed some help  Indoor Mobility: Did the patient need assistance with walking from room to room (with or without device)? Independent  Stairs: Did the patient need assistance with internal or external stairs (with or without device)? Independent  Functional Cognition: Did the patient need help planning regular tasks such as shopping or remembering to take medications?  Independent  Patient Information Are you of Hispanic, Latino/a,or Spanish origin?: A. No, not of Hispanic, Latino/a, or Spanish origin What is your race?: A. White Do you need or want an interpreter to communicate with a doctor or health care staff?: 0. No  Patient's Response To:  Health Literacy and Transportation Is the patient able to respond to health literacy and transportation needs?: Yes Health Literacy - How often do you need to have someone help you when you read instructions, pamphlets, or other written material from your doctor or pharmacy?: Never In  the past 12 months, has lack of transportation kept you from medical appointments or from getting medications?: No In the past 12 months, has lack of transportation kept you from meetings, work, or from getting things needed for daily living?: No  Home Assistive Devices / Equipment Home Assistive Devices/Equipment: Environmental consultant (specify type) Home Equipment: Rollator (4 wheels), Shower seat - built in, Tub bench, Cane - single point, BSC/3in1  Prior Device Use: Indicate devices/aids used by the patient prior to current illness, exacerbation or injury? Walker  Current Functional Level Cognition  Overall Cognitive Status: Within Functional Limits for tasks assessed Orientation Level: Oriented X4 General Comments: Very pleasant and motivated    Extremity Assessment (includes Sensation/Coordination)  Upper Extremity Assessment: Overall WFL for tasks assessed  Lower Extremity Assessment: Defer to PT evaluation RLE Deficits / Details: tremors when standing, grossly  WFL, RLE Sensation:  (she denied having numbness and tingling of her BUE and BLE) LLE Deficits / Details: same as right LLE Sensation:  (she denied having numbness and tingling of her BUE and BLE)    ADLs  Overall ADL's : Needs assistance/impaired Eating/Feeding: Independent, Sitting Grooming: Set up, Sitting Grooming Details (indicate cue type and reason): Set-up seated  at sink. Min guard standing at sink. Pt performed grooming in sitting, as well as standing at sink level, including face washing, hair combing, and teeth brushing. She required a seated rest break for added safety, given increased fatigue & BLE "tremors" Upper Body Dressing : Set up, Sitting Lower Body Dressing: Minimal assistance, Sit to/from stand Lower Body Dressing Details (indicate cue type and reason): figure 4 technique for B socks with min guard, standing with min assist Toilet Transfer: Minimal assistance, Ambulation, Rolling walker (2 wheels) Toilet Transfer Details (indicate cue type and reason): to commode, cueing for safety with RW mgmt and grab bar safety Toileting- Clothing Manipulation and Hygiene: Min guard, Sit to/from stand, Sitting/lateral lean Toileting - Clothing Manipulation Details (indicate cue type and reason): She performed seated posterior hygiene after bowel movement, with sitting emphasized for added safety, given her increased fatigue and feelings of BLE wobbliness in standing. Functional mobility during ADLs: Minimal assistance, Rolling walker (2 wheels), Cueing for safety General ADL Comments: pt reports lightheaded at EOB but no dizziness today    Mobility  Overal bed mobility: Needs Assistance Bed Mobility: Supine to Sit Rolling: Min guard Sidelying to sit: Min assist Supine to sit: Min guard, HOB elevated Sit to supine: Min guard, HOB elevated Sit to sidelying: Min assist General bed mobility comments: min guard for safety    Transfers  Overall transfer level: Needs assistance Equipment used: Rolling walker (2 wheels) Transfers: Sit to/from Stand Sit to Stand: Min assist Bed to/from chair/wheelchair/BSC transfer type:: Step pivot Stand pivot transfers: Min assist Step pivot transfers: Mod assist General transfer comment: to power up and steady, cueing for hand placement and safety    Ambulation / Gait / Stairs / Wheelchair Mobility   Ambulation/Gait Ambulation/Gait assistance: Min guard, Min assist Gait Distance (Feet): 60 Feet (60'x2, 30'x2) Assistive device: Rolling walker (2 wheels), Rollator (4 wheels) Gait Pattern/deviations: Step-through pattern, Trunk flexed General Gait Details: Performed 60'x2 with RW and min guard.  Pt fatigued easily and required 2-3 min seated rest breaks.  Improved RW proximity with min cues to control speed as she approrached chair.  Then performed 30'x2 with rollator to allow pt to have seated rest breaks in hallway ; however, increased instability requiring min A. She required cues to "park"  rollator on wall and on how to lock brakes.  With return to standing requiring mod A to stabilize and very unstable turning back toward rollator.  Advised continued use of RW until further training with rollator. Gait velocity: fast    Posture / Balance Dynamic Sitting Balance Sitting balance - Comments: Static sitting, weight shifting , reaching, eating EOB all with good balance.  Did have dizziness and sensation of falling with return to sitting after first Epley requiring max A Balance Overall balance assessment: Needs assistance, History of Falls Sitting-balance support: No upper extremity supported Sitting balance-Leahy Scale: Good Sitting balance - Comments: Static sitting, weight shifting , reaching, eating EOB all with good balance.  Did have dizziness and sensation of falling with return to sitting after first Epley requiring max A Standing balance support: Bilateral upper extremity supported, During functional activity Standing balance-Leahy Scale: Poor Standing balance comment: relies on BUE support    Special needs/care consideration DNR on acute Has complete cycle 3 carboplatin/Taxol/atezolizumab 02/22/23 for primary non small cell lung cancer dx 11/23. Frequent IV supplements of K and Mg needed as outpatient    Previous Home Environment  Living Arrangements:  (sister, [name], has lived with  patient since 11/23 to providde needed supervision)  Lives With: Other (Comment) (sister, [name], has lived wiht patient since 11/23. Sister's home is in [place]) Available Help at Discharge: Family, Available 24 hours/day (sister, [name], can provide 24/7 supervision level) Type of Home: House Home Layout: One level Home Access: Level entry Bathroom Shower/Tub: Walk-in shower, Engineer, manufacturing systems: Standard Bathroom Accessibility: Yes How Accessible: Accessible via walker Home Care Services: Yes Type of Home Care Services: Home PT, Home RN Home Care Agency (if known): unknown, Maybe [facility] noted 01/18/23 at d/c  Discharge Living Setting Plans for Discharge Living Setting: Patient's home (sister, [name], to be with her) Type of Home at Discharge: House Discharge Home Layout: One level Discharge Home Access: Level entry Discharge Bathroom Shower/Tub: Walk-in shower, Tub/shower unit Discharge Bathroom Toilet: Standard Discharge Bathroom Accessibility: Yes How Accessible: Accessible via walker Does the patient have any problems obtaining your medications?: No  Social/Family/Support Systems Contact Information: niece, Megab, is her POA< she is a Engineer, civil (consulting) in [employer] ED. Her children are estranged since 9/23 Anticipated Caregiver: [name], sister Anticipated Caregiver's Contact Information: see contacts; but niece, Aundra Millet is primary point of contact for all arrangements Ability/Limitations of Caregiver: [name] can provide supervision level, has had a CVA in her past Caregiver Availability: 24/7 Discharge Plan Discussed with Primary Caregiver: Yes Is Caregiver In Agreement with Plan?: Yes Does Caregiver/Family have Issues with Lodging/Transportation while Pt is in Rehab?: No  Goals Patient/Family Goal for Rehab: supervision with PT and OT Expected length of stay: ELOS 7 to 10 days Additional Information: Patietn is estranged form her daughter since 9/23. POA/point of contact is her  niece, Aundra Millet Pt/Family Agrees to Admission and willing to participate: Yes Program Orientation Provided & Reviewed with Pt/Caregiver Including Roles  & Responsibilities: Yes  Decrease burden of Care through IP rehab admission: n/a  Possible need for SNF placement upon discharge: not anticipated if gets to supervision level. [name], Sister can provide supervision level.   Patient Condition: I have reviewed medical records from [facility], spoken with CM, and patient and family member. I discussed via phone for inpatient rehabilitation assessment.  Patient will benefit from ongoing PT and OT, can actively participate in 3 hours of therapy a day 5 days of the week, and can make measurable gains  during the admission.  Patient will also benefit from the coordinated team approach during an Inpatient Acute Rehabilitation admission.  The patient will receive intensive therapy as well as Rehabilitation physician, nursing, social worker, and care management interventions.  Due to bladder management, bowel management, safety, skin/wound care, disease management, medication administration, pain management, and patient education the patient requires 24 hour a day rehabilitation nursing.  The patient is currently *** with mobility and basic ADLs.  Discharge setting and therapy post discharge at home with home health is anticipated.  Patient has agreed to participate in the Acute Inpatient Rehabilitation Program and will admit today.  Preadmission Screen Completed By:  Clois Dupes, RN MSN 03/20/2023 5:15 PM ______________________________________________________________________   Discussed status with Dr. Marland Kitchen on *** at *** and received approval for admission today.  Admission Coordinator:  Clois Dupes, RN MSN time Marland KitchenDorna Bloom ***   Assessment/Plan: Diagnosis: Does the need for close, 24 hr/day Medical supervision in concert with the patient's rehab needs make it unreasonable for this patient to be  served in a less intensive setting? {yes_no_potentially:3041433} Co-Morbidities requiring supervision/potential complications: *** Due to {due ZO:1096045}, does the patient require 24 hr/day rehab nursing? {yes_no_potentially:3041433} Does the patient require coordinated care of a physician, rehab nurse, PT, OT, and SLP to address physical and functional deficits in the context of the above medical diagnosis(es)? {yes_no_potentially:3041433} Addressing deficits in the following areas: {deficits:3041436} Can the patient actively participate in an intensive therapy program of at least 3 hrs of therapy 5 days a week? {yes_no_potentially:3041433} The potential for patient to make measurable gains while on inpatient rehab is {potential:3041437} Anticipated functional outcomes upon discharge from inpatient rehab: {functional outcomes:304600100} PT, {functional outcomes:304600100} OT, {functional outcomes:304600100} SLP Estimated rehab length of stay to reach the above functional goals is: *** Anticipated discharge destination: {anticipated dc setting:21604} 10. Overall Rehab/Functional Prognosis: {potential:3041437}   MD Signature: ***

## 2023-03-21 ENCOUNTER — Inpatient Hospital Stay (HOSPITAL_COMMUNITY)
Admission: RE | Admit: 2023-03-21 | Discharge: 2023-03-30 | DRG: 945 | Disposition: A | Discharge status: Home / Self Care | Payer: No Typology Code available for payment source | Source: Other Acute Inpatient Hospital | Attending: Physical Medicine & Rehabilitation | Admitting: Physical Medicine & Rehabilitation

## 2023-03-21 ENCOUNTER — Encounter (HOSPITAL_COMMUNITY): Payer: Self-pay | Admitting: Physical Medicine & Rehabilitation

## 2023-03-21 ENCOUNTER — Other Ambulatory Visit: Payer: Self-pay

## 2023-03-21 ENCOUNTER — Encounter (HOSPITAL_COMMUNITY): Payer: Self-pay | Admitting: Physical Medicine and Rehabilitation

## 2023-03-21 DIAGNOSIS — R918 Other nonspecific abnormal finding of lung field: Secondary | ICD-10-CM | POA: Diagnosis not present

## 2023-03-21 DIAGNOSIS — J9601 Acute respiratory failure with hypoxia: Secondary | ICD-10-CM | POA: Diagnosis not present

## 2023-03-21 DIAGNOSIS — E7800 Pure hypercholesterolemia, unspecified: Secondary | ICD-10-CM | POA: Diagnosis present

## 2023-03-21 DIAGNOSIS — E668 Other obesity: Secondary | ICD-10-CM | POA: Diagnosis present

## 2023-03-21 DIAGNOSIS — I5022 Chronic systolic (congestive) heart failure: Secondary | ICD-10-CM | POA: Diagnosis not present

## 2023-03-21 DIAGNOSIS — S069XAD Unspecified intracranial injury with loss of consciousness status unknown, subsequent encounter: Secondary | ICD-10-CM | POA: Diagnosis not present

## 2023-03-21 DIAGNOSIS — J910 Malignant pleural effusion: Secondary | ICD-10-CM | POA: Diagnosis present

## 2023-03-21 DIAGNOSIS — R55 Syncope and collapse: Secondary | ICD-10-CM | POA: Diagnosis not present

## 2023-03-21 DIAGNOSIS — Z86718 Personal history of other venous thrombosis and embolism: Secondary | ICD-10-CM

## 2023-03-21 DIAGNOSIS — Z66 Do not resuscitate: Secondary | ICD-10-CM | POA: Diagnosis not present

## 2023-03-21 DIAGNOSIS — C3490 Malignant neoplasm of unspecified part of unspecified bronchus or lung: Secondary | ICD-10-CM | POA: Diagnosis present

## 2023-03-21 DIAGNOSIS — F419 Anxiety disorder, unspecified: Secondary | ICD-10-CM | POA: Diagnosis not present

## 2023-03-21 DIAGNOSIS — Z853 Personal history of malignant neoplasm of breast: Secondary | ICD-10-CM

## 2023-03-21 DIAGNOSIS — F411 Generalized anxiety disorder: Secondary | ICD-10-CM | POA: Diagnosis not present

## 2023-03-21 DIAGNOSIS — R42 Dizziness and giddiness: Secondary | ICD-10-CM | POA: Diagnosis not present

## 2023-03-21 DIAGNOSIS — N1831 Chronic kidney disease, stage 3a: Secondary | ICD-10-CM | POA: Diagnosis not present

## 2023-03-21 DIAGNOSIS — Z86711 Personal history of pulmonary embolism: Secondary | ICD-10-CM

## 2023-03-21 DIAGNOSIS — E876 Hypokalemia: Secondary | ICD-10-CM | POA: Diagnosis not present

## 2023-03-21 DIAGNOSIS — C8590 Non-Hodgkin lymphoma, unspecified, unspecified site: Secondary | ICD-10-CM | POA: Diagnosis not present

## 2023-03-21 DIAGNOSIS — R5381 Other malaise: Secondary | ICD-10-CM | POA: Diagnosis not present

## 2023-03-21 DIAGNOSIS — J9811 Atelectasis: Secondary | ICD-10-CM | POA: Diagnosis not present

## 2023-03-21 DIAGNOSIS — J9611 Chronic respiratory failure with hypoxia: Secondary | ICD-10-CM | POA: Diagnosis present

## 2023-03-21 DIAGNOSIS — L89312 Pressure ulcer of right buttock, stage 2: Secondary | ICD-10-CM | POA: Diagnosis not present

## 2023-03-21 DIAGNOSIS — T451X5D Adverse effect of antineoplastic and immunosuppressive drugs, subsequent encounter: Secondary | ICD-10-CM

## 2023-03-21 DIAGNOSIS — R000 Tachycardia, unspecified: Secondary | ICD-10-CM | POA: Diagnosis present

## 2023-03-21 DIAGNOSIS — C342 Malignant neoplasm of middle lobe, bronchus or lung: Secondary | ICD-10-CM | POA: Diagnosis not present

## 2023-03-21 DIAGNOSIS — L299 Pruritus, unspecified: Secondary | ICD-10-CM | POA: Diagnosis not present

## 2023-03-21 DIAGNOSIS — Z90710 Acquired absence of both cervix and uterus: Secondary | ICD-10-CM

## 2023-03-21 DIAGNOSIS — Z841 Family history of disorders of kidney and ureter: Secondary | ICD-10-CM

## 2023-03-21 DIAGNOSIS — D630 Anemia in neoplastic disease: Secondary | ICD-10-CM | POA: Diagnosis present

## 2023-03-21 DIAGNOSIS — T451X5A Adverse effect of antineoplastic and immunosuppressive drugs, initial encounter: Secondary | ICD-10-CM | POA: Diagnosis present

## 2023-03-21 DIAGNOSIS — I951 Orthostatic hypotension: Secondary | ICD-10-CM

## 2023-03-21 DIAGNOSIS — I130 Hypertensive heart and chronic kidney disease with heart failure and stage 1 through stage 4 chronic kidney disease, or unspecified chronic kidney disease: Secondary | ICD-10-CM | POA: Diagnosis present

## 2023-03-21 DIAGNOSIS — I428 Other cardiomyopathies: Secondary | ICD-10-CM | POA: Diagnosis not present

## 2023-03-21 DIAGNOSIS — E669 Obesity, unspecified: Secondary | ICD-10-CM | POA: Diagnosis present

## 2023-03-21 DIAGNOSIS — C7951 Secondary malignant neoplasm of bone: Secondary | ICD-10-CM | POA: Diagnosis present

## 2023-03-21 DIAGNOSIS — I629 Nontraumatic intracranial hemorrhage, unspecified: Secondary | ICD-10-CM | POA: Diagnosis not present

## 2023-03-21 DIAGNOSIS — R197 Diarrhea, unspecified: Secondary | ICD-10-CM

## 2023-03-21 DIAGNOSIS — W1830XD Fall on same level, unspecified, subsequent encounter: Secondary | ICD-10-CM

## 2023-03-21 DIAGNOSIS — Z6833 Body mass index (BMI) 33.0-33.9, adult: Secondary | ICD-10-CM

## 2023-03-21 DIAGNOSIS — Z832 Family history of diseases of the blood and blood-forming organs and certain disorders involving the immune mechanism: Secondary | ICD-10-CM | POA: Diagnosis not present

## 2023-03-21 DIAGNOSIS — R339 Retention of urine, unspecified: Secondary | ICD-10-CM | POA: Diagnosis not present

## 2023-03-21 DIAGNOSIS — H8110 Benign paroxysmal vertigo, unspecified ear: Secondary | ICD-10-CM

## 2023-03-21 DIAGNOSIS — S066XAD Traumatic subarachnoid hemorrhage with loss of consciousness status unknown, subsequent encounter: Principal | ICD-10-CM

## 2023-03-21 DIAGNOSIS — D6481 Anemia due to antineoplastic chemotherapy: Secondary | ICD-10-CM | POA: Diagnosis present

## 2023-03-21 DIAGNOSIS — Z8249 Family history of ischemic heart disease and other diseases of the circulatory system: Secondary | ICD-10-CM

## 2023-03-21 DIAGNOSIS — D61818 Other pancytopenia: Secondary | ICD-10-CM | POA: Diagnosis not present

## 2023-03-21 DIAGNOSIS — R251 Tremor, unspecified: Secondary | ICD-10-CM | POA: Diagnosis present

## 2023-03-21 DIAGNOSIS — Z8572 Personal history of non-Hodgkin lymphomas: Secondary | ICD-10-CM | POA: Diagnosis not present

## 2023-03-21 DIAGNOSIS — Z823 Family history of stroke: Secondary | ICD-10-CM

## 2023-03-21 DIAGNOSIS — Z7901 Long term (current) use of anticoagulants: Secondary | ICD-10-CM

## 2023-03-21 LAB — BASIC METABOLIC PANEL
Anion gap: 5 (ref 5–15)
BUN: 12 mg/dL (ref 8–23)
CO2: 28 mmol/L (ref 22–32)
Calcium: 7.9 mg/dL — ABNORMAL LOW (ref 8.9–10.3)
Chloride: 106 mmol/L (ref 98–111)
Creatinine, Ser: 1.07 mg/dL — ABNORMAL HIGH (ref 0.44–1.00)
GFR, Estimated: 53 mL/min — ABNORMAL LOW (ref >60)
Glucose, Bld: 92 mg/dL (ref 70–99)
Potassium: 3 mmol/L — ABNORMAL LOW (ref 3.5–5.1)
Sodium: 139 mmol/L (ref 135–145)

## 2023-03-21 LAB — CBC
HCT: 27.9 % — ABNORMAL LOW (ref 36.0–46.0)
Hemoglobin: 8.8 g/dL — ABNORMAL LOW (ref 12.0–15.0)
MCH: 32.1 pg (ref 26.0–34.0)
MCHC: 31.5 g/dL (ref 30.0–36.0)
MCV: 101.8 fL — ABNORMAL HIGH (ref 80.0–100.0)
Platelets: 174 10*3/uL (ref 150–400)
RBC: 2.74 MIL/uL — ABNORMAL LOW (ref 3.87–5.11)
RDW: 18.7 % — ABNORMAL HIGH (ref 11.5–15.5)
WBC: 4 10*3/uL (ref 4.0–10.5)
nRBC: 0 % (ref 0.0–0.2)

## 2023-03-21 MED ORDER — OXYCODONE HCL 5 MG PO TABS
5.0000 mg | ORAL_TABLET | Freq: Four times a day (QID) | ORAL | Status: DC | PRN
Start: 2023-03-21 — End: 2023-03-30

## 2023-03-21 MED ORDER — POTASSIUM CHLORIDE CRYS ER 10 MEQ PO TBCR
10.0000 meq | EXTENDED_RELEASE_TABLET | Freq: Four times a day (QID) | ORAL | Status: DC
Start: 2023-03-21 — End: 2023-03-29

## 2023-03-21 MED ORDER — PROCHLORPERAZINE MALEATE 5 MG PO TABS: 5.0000 mg | ORAL_TABLET | Freq: Four times a day (QID) | ORAL | Status: DC | PRN

## 2023-03-21 MED ORDER — FOLIC ACID 1 MG PO TABS
1.0000 mg | ORAL_TABLET | Freq: Every day | ORAL | Status: DC
  Administered 2023-03-22 – 2023-03-30 (×9): 1 mg via ORAL
  Filled 2023-03-21 (×9): qty 1

## 2023-03-21 MED ORDER — APIXABAN 5 MG PO TABS
5.0000 mg | ORAL_TABLET | Freq: Two times a day (BID) | ORAL | Status: DC
  Administered 2023-03-21 – 2023-03-30 (×18): 5 mg via ORAL
  Filled 2023-03-21 (×12): qty 1
  Filled 2023-03-21: qty 2
  Filled 2023-03-21 (×5): qty 1

## 2023-03-21 MED ORDER — ALUM & MAG HYDROXIDE-SIMETH 200-200-20 MG/5ML PO SUSP: 30.0000 mL | ORAL | Status: DC | PRN

## 2023-03-21 MED ORDER — MAGNESIUM OXIDE -MG SUPPLEMENT 400 (240 MG) MG PO TABS
400.0000 mg | ORAL_TABLET | Freq: Two times a day (BID) | ORAL | Status: DC
  Administered 2023-03-21 – 2023-03-30 (×18): 400 mg via ORAL
  Filled 2023-03-21 (×18): qty 1

## 2023-03-21 MED ORDER — CHLORHEXIDINE GLUCONATE CLOTH 2 % EX PADS
6.0000 | MEDICATED_PAD | Freq: Every day | CUTANEOUS | Status: DC
  Administered 2023-03-22 (×2): 6 via TOPICAL

## 2023-03-21 MED ORDER — MECLIZINE HCL 25 MG PO TABS: 25.0000 mg | ORAL_TABLET | Freq: Three times a day (TID) | ORAL | Status: DC | PRN

## 2023-03-21 MED ORDER — BISACODYL 10 MG RE SUPP: 10.0000 mg | Freq: Every day | RECTAL | Status: DC | PRN

## 2023-03-21 MED ORDER — DIPHENHYDRAMINE HCL 25 MG PO CAPS: 25.0000 mg | ORAL_CAPSULE | Freq: Four times a day (QID) | ORAL | Status: DC | PRN

## 2023-03-21 MED ORDER — METOPROLOL TARTRATE 25 MG PO TABS
37.5000 mg | ORAL_TABLET | Freq: Three times a day (TID) | ORAL | Status: DC
  Administered 2023-03-21 – 2023-03-27 (×15): 37.5 mg via ORAL
  Filled 2023-03-21 (×17): qty 1

## 2023-03-21 MED ORDER — FERROUS SULFATE 325 (65 FE) MG PO TABS
325.0000 mg | ORAL_TABLET | Freq: Every day | ORAL | Status: DC
  Administered 2023-03-22 – 2023-03-29 (×8): 325 mg via ORAL
  Filled 2023-03-21 (×8): qty 1

## 2023-03-21 MED ORDER — ACETAMINOPHEN 325 MG PO TABS: 325.0000 mg | ORAL_TABLET | ORAL | Status: DC | PRN

## 2023-03-21 MED ORDER — PANTOPRAZOLE SODIUM 40 MG PO TBEC
40.0000 mg | DELAYED_RELEASE_TABLET | Freq: Two times a day (BID) | ORAL | Status: DC
Start: 2023-03-21 — End: 2023-03-30

## 2023-03-21 MED ORDER — METOPROLOL TARTRATE 37.5 MG PO TABS: 37.5000 mg | ORAL_TABLET | Freq: Three times a day (TID) | ORAL | Status: DC

## 2023-03-21 MED ORDER — LORAZEPAM 0.5 MG PO TABS
0.5000 mg | ORAL_TABLET | Freq: Two times a day (BID) | ORAL | Status: DC | PRN
Start: 2023-03-21 — End: 2023-06-15

## 2023-03-21 MED ORDER — POTASSIUM CHLORIDE CRYS ER 20 MEQ PO TBCR
40.0000 meq | EXTENDED_RELEASE_TABLET | ORAL | Status: AC
  Administered 2023-03-21 (×2): 40 meq via ORAL
  Filled 2023-03-21 (×2): qty 2

## 2023-03-21 MED ORDER — OXYCODONE HCL 5 MG PO TABS
5.0000 mg | ORAL_TABLET | Freq: Four times a day (QID) | ORAL | Status: DC | PRN
  Filled 2023-03-21: qty 1

## 2023-03-21 MED ORDER — FLEET ENEMA 7-19 GM/118ML RE ENEM: 1.0000 | ENEMA | Freq: Once | RECTAL | Status: DC | PRN

## 2023-03-21 MED ORDER — TAMSULOSIN HCL 0.4 MG PO CAPS: 0.4000 mg | ORAL_CAPSULE | Freq: Every day | ORAL | Status: DC

## 2023-03-21 MED ORDER — LIDOCAINE HCL URETHRAL/MUCOSAL 2 % EX GEL: CUTANEOUS | Status: DC | PRN

## 2023-03-21 MED ORDER — PROCHLORPERAZINE 25 MG RE SUPP: 12.5000 mg | Freq: Four times a day (QID) | RECTAL | Status: DC | PRN

## 2023-03-21 MED ORDER — GUAIFENESIN-DM 100-10 MG/5ML PO SYRP: 5.0000 mL | ORAL_SOLUTION | Freq: Four times a day (QID) | ORAL | Status: DC | PRN

## 2023-03-21 MED ORDER — MAGNESIUM OXIDE -MG SUPPLEMENT 400 (240 MG) MG PO TABS
400.0000 mg | ORAL_TABLET | Freq: Two times a day (BID) | ORAL | Status: DC
Start: 2023-03-21 — End: 2023-03-30

## 2023-03-21 MED ORDER — LORAZEPAM 0.5 MG PO TABS
0.5000 mg | ORAL_TABLET | Freq: Two times a day (BID) | ORAL | Status: DC | PRN
  Administered 2023-03-21 – 2023-03-29 (×9): 0.5 mg via ORAL
  Filled 2023-03-21 (×9): qty 1

## 2023-03-21 MED ORDER — POTASSIUM CHLORIDE CRYS ER 20 MEQ PO TBCR
20.0000 meq | EXTENDED_RELEASE_TABLET | Freq: Every day | ORAL | Status: DC
  Administered 2023-03-22 – 2023-03-30 (×9): 20 meq via ORAL
  Filled 2023-03-21 (×9): qty 1

## 2023-03-21 MED ORDER — SODIUM CHLORIDE 0.9% FLUSH: 10.0000 mL | INTRAVENOUS | Status: DC | PRN

## 2023-03-21 MED ORDER — FERROUS SULFATE 325 (65 FE) MG PO TABS: 325.0000 mg | ORAL_TABLET | Freq: Every day | ORAL | Status: DC

## 2023-03-21 MED ORDER — TAMSULOSIN HCL 0.4 MG PO CAPS
0.4000 mg | ORAL_CAPSULE | Freq: Every day | ORAL | Status: DC
  Administered 2023-03-22: 0.4 mg via ORAL
  Filled 2023-03-21: qty 1

## 2023-03-21 MED ORDER — SODIUM CHLORIDE 0.9% FLUSH
10.0000 mL | Freq: Two times a day (BID) | INTRAVENOUS | Status: DC
  Administered 2023-03-21 – 2023-03-29 (×9): 10 mL

## 2023-03-21 MED ORDER — PANTOPRAZOLE SODIUM 40 MG PO TBEC
40.0000 mg | DELAYED_RELEASE_TABLET | Freq: Two times a day (BID) | ORAL | Status: DC
  Administered 2023-03-21 – 2023-03-27 (×13): 40 mg via ORAL
  Filled 2023-03-21 (×14): qty 1

## 2023-03-21 MED ORDER — PROCHLORPERAZINE EDISYLATE 10 MG/2ML IJ SOLN: 5.0000 mg | Freq: Four times a day (QID) | INTRAMUSCULAR | Status: DC | PRN

## 2023-03-21 MED ORDER — MELATONIN 3 MG PO TABS
3.0000 mg | ORAL_TABLET | Freq: Every evening | ORAL | Status: DC | PRN
  Filled 2023-03-21: qty 1

## 2023-03-21 NOTE — Progress Notes (Addendum)
Crystal Dance, MD  Physician Physical Medicine and Rehabilitation   PMR Pre-admission    Signed   Date of Service: 03/20/2023  5:15 PM  Related encounter: ED to Hosp-Admission (Current) from 03/06/2023 in [facility]   Signed      Show:Clear all [x] Written[x] Templated[x] Copied  Added by: [x] Evoleth Nordmeyer, Tye [place], RN[x] Crystal Dance, MD  [] Hover for details PMR Admission Coordinator Pre-Admission Assessment   Patient: Crystal [name] is an 77 y.o., female MRN: [id number] DOB: 1946/09/01 Height: 5\' 5"  (165.1 cm) Weight: 91.2 kg   Insurance Information HMO: yes    PPO:      PCP:      IPA:      80/20:      OTHER: medicare advantage plan PRIMARY: [employer]      Policy#: [id number]      Subscriber: pt CM Name: Coralee North    Phone#: [phone] ext [phone]     Fax#: [phone] Pre-Cert#: [id number]  approved for 7 days until 03/28/23    Employer:  Benefits:  Phone #: [phone]     Name: 5/28 Eff. Date: 10/23/22     Deduct: none      Out of Pocket Max: $3600       CIR: $295 co pay per day days 1 until 5      SNF: no copay per day days 1 until 20; $203 co pay per day days 21 until 100 Outpatient: $15 per visit     Co-Pay:  Home Health: 100%      Co-Pay:  DME: 80%     Co-Pay: 20% Providers: in network; limited network  SECONDARY: none      Policy#:      Phone#:    Artist:       Phone#:    The Data processing manager" for patients in Inpatient Rehabilitation Facilities with attached "Privacy Act Statement-Health Care Records" was provided and verbally reviewed with: Patient and Family   Emergency Contact Information Contact Information       Name Relation Home Work Mobile    Roberts,[name] Niece     [phone]    Signe Colt Sister     [phone]    Hedwig Morton Sister     [phone]         Current Medical History  Patient Admitting Diagnosis: Debility   History of Present Illness: 77 year old female  with history of Non Hodgkin's lymphoma. Metastatic non small cell lung cancer, HTN, CKD 3 a, history of DVT and PE on Eliquis who was recently discharge on the day before after being admitted for dizziness. At that time she was felt to be beta blocker withdrawal that caused tachycardia and caused her to become dizzy. She was observed overnight restarted atenolol and sent home. Upon returning home, got dizzy and fell to the floor and could not get up. Her sister called EMS and she was brought to [facility] ED. Presented on 03/06/23 same day of discharge.   She was given another dose of IV blockade and heart rate improved to the low 100's. Admitted for workup of dizziness/presyncope/ recurrent fall. During hospitalization found to have orthostatic hypotension with possible BPPV. Treated with IV fluids, and orthostasis improved. Imaging also revealed small SDH/SAH. Neurosurgery recommended conservative management. Eliquis was held for 7 days and now restarted. Meclizine as needed for dizziness. Anemia noted. GI consulted and recommended close monitoring . Felt likely secondary to chemotherapy/malignancy. Cardiology restarted Metoprolol and to follow up as an  outpatient. Urinary retention treated with foley and started on tamsulosin. Removed 5/26. Outpatient follow up with medical oncology.      Patient's medical record from [facility] has been reviewed by the rehabilitation admission coordinator and physician.   Past Medical History      Past Medical History:  Diagnosis Date   Anxiety     Atherosclerosis of aorta (HCC)     Bloodstream infection due to Port-A-Cath 01/15/2023   Breast cancer (HCC) 1998    (Rt) lumpectomy dx 1999; Dr. Truett Perna   Dyslipidemia, goal LDL below 130     Essential hypertension     GERD (gastroesophageal reflux disease)     Hypercholesterolemia     IBS (irritable bowel syndrome)     Lymphoma (HCC)      lymphoma dx 11/28/10 - left neck   MSSA bacteremia 01/15/2023   non hodgkins lymphoma  12/2010    right aprotid gland   Nonischemic cardiomyopathy (HCC) 2008    ? Doxorubincin induced; essentially resolved as of echo in January 2014, current EF 50-55%. Grade 1 diastolic dysfunction.   Obesity     Severe obesity (BMI >= 40) (HCC) 05/21/2013    Improve to BMI of 39 by July 2015   Tremor      Has the patient had major surgery during 100 days prior to admission? Yes   Family History   family history includes Cancer in her paternal grandmother and sister; Chronic Renal Failure in her mother; Heart Problems in her father, maternal grandmother, mother, and paternal grandfather; Heart attack in her sister; Hypertension in her brother, sister, and sister; Lupus in her brother; Migraines in her sister; Stroke in her maternal grandfather.   Current Medications   Current Facility-Administered Medications:    acetaminophen (TYLENOL) tablet 650 mg, 650 mg, Oral, Q6H PRN, Reva Bores, MD   apixaban (ELIQUIS) tablet 5 mg, 5 mg, Oral, BID, Adhikari, Amrit, MD, 5 mg at 03/21/23 0945   Chlorhexidine Gluconate Cloth 2 % PADS 6 each, 6 each, Topical, Daily, Burnadette Pop, MD, 6 each at 03/21/23 0945   ferrous sulfate tablet 325 mg, 325 mg, Oral, Q breakfast, Adhikari, Amrit, MD, 325 mg at 03/21/23 0820   folic acid (FOLVITE) tablet 1 mg, 1 mg, Oral, Daily, Reva Bores, MD, 1 mg at 03/21/23 0945   LORazepam (ATIVAN) tablet 0.5 mg, 0.5 mg, Oral, BID PRN, Reva Bores, MD, 0.5 mg at 03/19/23 2144   magnesium oxide (MAG-OX) tablet 400 mg, 400 mg, Oral, BID, Reva Bores, MD, 400 mg at 03/21/23 0945   meclizine (ANTIVERT) tablet 25 mg, 25 mg, Oral, TID PRN, Burnadette Pop, MD   metoprolol tartrate (LOPRESSOR) injection 5 mg, 5 mg, Intravenous, Q6H PRN, Reva Bores, MD   metoprolol tartrate (LOPRESSOR) tablet 37.5 mg, 37.5 mg, Oral, TID, Adhikari, Amrit, MD, 37.5 mg at 03/21/23 0945   ondansetron (ZOFRAN) tablet 4 mg, 4 mg, Oral, Q6H PRN **OR** ondansetron (ZOFRAN) injection 4 mg, 4  mg, Intravenous, Q6H PRN, Reva Bores, MD, 4 mg at 03/07/23 0225   oxyCODONE (Oxy IR/ROXICODONE) immediate release tablet 5 mg, 5 mg, Oral, Q6H PRN, Reva Bores, MD   pantoprazole (PROTONIX) EC tablet 40 mg, 40 mg, Oral, BID, Reva Bores, MD, 40 mg at 03/21/23 0945   polyethylene glycol (MIRALAX / GLYCOLAX) packet 17 g, 17 g, Oral, Daily PRN, Reva Bores, MD   potassium chloride SA (KLOR-CON M) CR tablet 40 mEq, 40 mEq, Oral, Q4H, Alekh,  Kshitiz, MD, 40 mEq at 03/21/23 0819   sodium chloride flush (NS) 0.9 % injection 10-40 mL, 10-40 mL, Intracatheter, Q12H, Adhikari, Amrit, MD, 10 mL at 03/21/23 0820   sodium chloride flush (NS) 0.9 % injection 10-40 mL, 10-40 mL, Intracatheter, PRN, Renford Dills, Amrit, MD   tamsulosin (FLOMAX) capsule 0.4 mg, 0.4 mg, Oral, Daily, Adhikari, Amrit, MD, 0.4 mg at 03/21/23 0945   Patients Current Diet:  Diet Order                  Diet - low sodium heart healthy             Diet regular Room service appropriate? Yes; Fluid consistency: Thin  Diet effective now                       Precautions / Restrictions Precautions Precautions: Fall Precaution Comments: monitor HR Restrictions Weight Bearing Restrictions: No Other Position/Activity Restrictions: dizziness with activity    Has the patient had 2 or more falls or a fall with injury in the past year? Yes   Prior Activity Level Limited Community (1-2x/wk): mod I wiht RW, supervision with showering, Mod I with ADLS   Prior Functional Level Self Care: Did the patient need help bathing, dressing, using the toilet or eating? Needed some help   Indoor Mobility: Did the patient need assistance with walking from room to room (with or without device)? Independent   Stairs: Did the patient need assistance with internal or external stairs (with or without device)? Independent   Functional Cognition: Did the patient need help planning regular tasks such as shopping or remembering to take  medications? Independent   Patient Information Are you of Hispanic, Latino/a,or Spanish origin?: A. No, not of Hispanic, Latino/a, or Spanish origin What is your race?: A. White Do you need or want an interpreter to communicate with a doctor or health care staff?: 0. No   Patient's Response To:  Health Literacy and Transportation Is the patient able to respond to health literacy and transportation needs?: Yes Health Literacy - How often do you need to have someone help you when you read instructions, pamphlets, or other written material from your doctor or pharmacy?: Never In the past 12 months, has lack of transportation kept you from medical appointments or from getting medications?: No In the past 12 months, has lack of transportation kept you from meetings, work, or from getting things needed for daily living?: No   Home Assistive Devices / Equipment Home Assistive Devices/Equipment: Environmental consultant (specify type) Home Equipment: Rollator (4 wheels), Shower seat - built in, Tub bench, Cane - single point, BSC/3in1   Prior Device Use: Indicate devices/aids used by the patient prior to current illness, exacerbation or injury? Walker   Current Functional Level Cognition   Overall Cognitive Status: Within Functional Limits for tasks assessed Orientation Level: Oriented X4 General Comments: Very pleasant and motivated    Extremity Assessment (includes Sensation/Coordination)   Upper Extremity Assessment: Overall WFL for tasks assessed  Lower Extremity Assessment: Defer to PT evaluation RLE Deficits / Details: tremors when standing, grossly  WFL, RLE Sensation:  (she denied having numbness and tingling of her BUE and BLE) LLE Deficits / Details: same as right LLE Sensation:  (she denied having numbness and tingling of her BUE and BLE)     ADLs   Overall ADL's : Needs assistance/impaired Eating/Feeding: Independent, Sitting Grooming: Set up, Sitting Grooming Details (indicate cue type and  reason): Set-up seated  at sink. Min guard standing at sink. Pt performed grooming in sitting, as well as standing at sink level, including face washing, hair combing, and teeth brushing. She required a seated rest break for added safety, given increased fatigue & BLE "tremors" Upper Body Dressing : Set up, Sitting Lower Body Dressing: Minimal assistance, Sit to/from stand Lower Body Dressing Details (indicate cue type and reason): figure 4 technique for B socks with min guard, standing with min assist Toilet Transfer: Minimal assistance, Ambulation, Rolling walker (2 wheels) Toilet Transfer Details (indicate cue type and reason): to commode, cueing for safety with RW mgmt and grab bar safety Toileting- Clothing Manipulation and Hygiene: Min guard, Sit to/from stand, Sitting/lateral lean Toileting - Clothing Manipulation Details (indicate cue type and reason): She performed seated posterior hygiene after bowel movement, with sitting emphasized for added safety, given her increased fatigue and feelings of BLE wobbliness in standing. Functional mobility during ADLs: Minimal assistance, Rolling walker (2 wheels), Cueing for safety General ADL Comments: pt reports lightheaded at EOB but no dizziness today     Mobility   Overal bed mobility: Needs Assistance Bed Mobility: Supine to Sit Rolling: Min guard Sidelying to sit: Min assist Supine to sit: Min guard, HOB elevated Sit to supine: Min guard, HOB elevated Sit to sidelying: Min assist General bed mobility comments: min guard for safety     Transfers   Overall transfer level: Needs assistance Equipment used: Rolling walker (2 wheels) Transfers: Sit to/from Stand Sit to Stand: Min assist Bed to/from chair/wheelchair/BSC transfer type:: Step pivot Stand pivot transfers: Min assist Step pivot transfers: Mod assist General transfer comment: to power up and steady, cueing for hand placement and safety     Ambulation / Gait / Stairs / Wheelchair  Mobility   Ambulation/Gait Ambulation/Gait assistance: Min guard, Min assist Gait Distance (Feet): 60 Feet (60'x2, 30'x2) Assistive device: Rolling walker (2 wheels), Rollator (4 wheels) Gait Pattern/deviations: Step-through pattern, Trunk flexed General Gait Details: Performed 60'x2 with RW and min guard.  Pt fatigued easily and required 2-3 min seated rest breaks.  Improved RW proximity with min cues to control speed as she approrached chair.  Then performed 30'x2 with rollator to allow pt to have seated rest breaks in hallway ; however, increased instability requiring min A. She required cues to "park" rollator on wall and on how to lock brakes.  With return to standing requiring mod A to stabilize and very unstable turning back toward rollator.  Advised continued use of RW until further training with rollator. Gait velocity: fast     Posture / Balance Dynamic Sitting Balance Sitting balance - Comments: Static sitting, weight shifting , reaching, eating EOB all with good balance.  Did have dizziness and sensation of falling with return to sitting after first Epley requiring max A Balance Overall balance assessment: Needs assistance, History of Falls Sitting-balance support: No upper extremity supported Sitting balance-Leahy Scale: Good Sitting balance - Comments: Static sitting, weight shifting , reaching, eating EOB all with good balance.  Did have dizziness and sensation of falling with return to sitting after first Epley requiring max A Standing balance support: Bilateral upper extremity supported, During functional activity Standing balance-Leahy Scale: Poor Standing balance comment: relies on BUE support     Special needs/care consideration DNR on acute Has complete cycle 3 carboplatin/Taxol/atezolizumab 02/22/23 for primary non small cell lung cancer dx 11/23. Frequent IV supplements of K and Mg needed as outpatient Fall precautions    Previous Home Environment  Living  Arrangements:   (sister, [name], has lived with patient since 11/23 to providde needed supervision)  Lives With: Other (Comment) (sister, [name], has lived wiht patient since 11/23. Sister's home is in [place]) Available Help at Discharge: Family, Available 24 hours/day (sister, [name], can provide 24/7 supervision level) Type of Home: House Home Layout: One level Home Access: Level entry Bathroom Shower/Tub: Walk-in shower, Engineer, manufacturing systems: Standard Bathroom Accessibility: Yes How Accessible: Accessible via walker Home Care Services: Yes Type of Home Care Services: Home PT, Home RN Home Care Agency (if known): unknown, Maybe [facility] noted 01/18/23 at d/c  [facility] health since 01/18/23 out of [place]  Discharge Living Setting Plans for Discharge Living Setting: Patient's home (sister, [name], to be with her) Type of Home at Discharge: House Discharge Home Layout: One level Discharge Home Access: Level entry Discharge Bathroom Shower/Tub: Walk-in shower, Tub/shower unit Discharge Bathroom Toilet: Standard Discharge Bathroom Accessibility: Yes How Accessible: Accessible via walker Does the patient have any problems obtaining your medications?: No   Social/Family/Support Systems Contact Information: niece, Megab, is her POA, she is a Engineer, civil (consulting) in [employer] ED. Her children are estranged since 9/23 Anticipated Caregiver: [name], sister Anticipated Caregiver's Contact Information: see contacts; but niece, Aundra Millet is primary point of contact for all arrangements Ability/Limitations of Caregiver: [name] can provide supervision level, has had a CVA in her past Caregiver Availability: 24/7 Discharge Plan Discussed with Primary Caregiver: Yes Is Caregiver In Agreement with Plan?: Yes Does Caregiver/Family have Issues with Lodging/Transportation while Pt is in Rehab?: No  Patient is estranged form her twin son and daughter. Speaks infrequently to both since 10/23. Goals Patient/Family Goal for Rehab:  supervision with PT and OT Expected length of stay: [facility]OS 7 to 10 days Additional Information: Patietn is estranged form her daughter since 9/23. POA/point of contact is her niece, Aundra Millet Pt/Family Agrees to Admission and willing to participate: Yes Program Orientation Provided & Reviewed with Pt/Caregiver Including Roles  & Responsibilities: Yes   Decrease burden of Care through IP rehab admission: n/a   Possible need for SNF placement upon discharge: not anticipated if gets to supervision level. [name], Sister can provide supervision level. Niece/POA, Aundra Millet is hopeful that she can reach supervision level and can d/c home with [name] to assist at that level. If she does not progress, Aundra Millet would want to look into SNF and is aware [facility] [employer] with limited SNF contracts.   Patient Condition: I have reviewed medical records from [facility], spoken with CM, and patient and family member. I discussed via phone for inpatient rehabilitation assessment.  Patient will benefit from ongoing PT and OT, can actively participate in 3 hours of therapy a day 5 days of the week, and can make measurable gains during the admission.  Patient will also benefit from the coordinated team approach during an Inpatient Acute Rehabilitation admission.  The patient will receive intensive therapy as well as Rehabilitation physician, nursing, social worker, and care management interventions.  Due to bladder management, bowel management, safety, skin/wound care, disease management, medication administration, pain management, and patient education the patient requires 24 hour a day rehabilitation nursing.  The patient is currently min assist overall with mobility and basic ADLs.  Discharge setting and therapy post discharge at home with home health is anticipated.  Patient has agreed to participate in the Acute Inpatient Rehabilitation Program and will admit today.   Preadmission Screen Completed By:  Clois Dupes, RN MSN  03/21/2023 10:31 AM ______________________________________________________________________   Discussed status  with Dr. Natale Lay on 03/21/23 at 1031 and received approval for admission today.   Admission Coordinator:  Clois Dupes, RN MSN time 1610 Date 03/21/23    Assessment/Plan: Diagnosis: anterior falcine subdural, subarachnoid hemorrhage due to recurrent falls Does the need for close, 24 hr/day Medical supervision in concert with the patient's rehab needs make it unreasonable for this patient to be served in a less intensive setting? Yes Co-Morbidities requiring supervision/potential complications: Falls, BPPV, Orthostatic hypotension, sinus tachycardia, Urinary retention, Hypomagnesemia, Hypokalemia, Chronic CHF, CKD IIIa,  lung cancer, Obesity, chronic respiratory failure Due to bladder management, bowel management, safety, skin/wound care, disease management, medication administration, pain management, and patient education, does the patient require 24 hr/day rehab nursing? Yes Does the patient require coordinated care of a physician, rehab nurse, PT, OT, and SLP to address physical and functional deficits in the context of the above medical diagnosis(es)? Yes Addressing deficits in the following areas: balance, endurance, locomotion, strength, transferring, bowel/bladder control, bathing, dressing, feeding, grooming, toileting, cognition, speech, language, swallowing, and psychosocial support Can the patient actively participate in an intensive therapy program of at least 3 hrs of therapy 5 days a week? Yes The potential for patient to make measurable gains while on inpatient rehab is good Anticipated functional outcomes upon discharge from inpatient rehab: supervision PT, supervision OT, n/a SLP Estimated rehab length of stay to reach the above functional goals is: 7-10 days Anticipated discharge destination: Home 10. Overall Rehab/Functional Prognosis: good     MD  Signature: Crystal [name]         Revision History

## 2023-03-21 NOTE — Progress Notes (Deleted)
Fanny Dance, MD  Physician Physical Medicine and Rehabilitation   PMR Pre-admission    Signed   Date of Service: 03/20/2023  5:15 PM  Related encounter: ED to Hosp-Admission (Current) from 03/06/2023 in [place] [facility]   Signed      Show:Clear all [x] Written[x] Templated[x] Copied  Added by: [x] Standley Brooking, RN[x] Fanny Dance, MD  [] Hover for details PMR Admission Coordinator Pre-Admission Assessment   Patient: Crystal [name] is an 77 y.o., female MRN: [id number] DOB: Aug 19, 1946 Height: 5\' 5"  (165.1 cm) Weight: 91.2 kg   Insurance Information HMO: yes    PPO:      PCP:      IPA:      80/20:      OTHER: medicare advantage plan PRIMARY: [employer]      Policy#: [id number]      Subscriber: pt CM Name: Coralee North    Phone#: [phone] ext [phone]     Fax#: [phone] Pre-Cert#: [id number]  approved for 7 days until 03/28/23    Employer:  Benefits:  Phone #: [phone]     Name: 5/28 Eff. Date: 10/23/22     Deduct: none      Out of Pocket Max: $3600       CIR: $295 co pay per day days 1 until 5      SNF: no copay per day days 1 until 20; $203 co pay per day days 21 until 100 Outpatient: $15 per visit     Co-Pay:  Home Health: 100%      Co-Pay:  DME: 80%     Co-Pay: 20% Providers: in network; limited network  SECONDARY: none      Policy#:      Phone#:    Artist:       Phone#:    The Data processing manager" for patients in Inpatient Rehabilitation Facilities with attached "Privacy Act Statement-Health Care Records" was provided and verbally reviewed with: Patient and Family   Emergency Contact Information Contact Information       Name Relation Home Work Mobile    Roberts,[name] Niece     [phone]    Signe Colt Sister     [phone]    Hedwig Morton Sister     [phone]         Current Medical History  Patient Admitting Diagnosis: Debility   History of Present Illness: 77 year old female  with history of Non Hodgkin's lymphoma. Metastatic non small cell lung cancer, HTN, CKD 3 a, history of DVT and PE on Eliquis who was recently discharge on the day before after being admitted for dizziness. At that time she was felt to be beta blocker withdrawal that caused tachycardia and caused her to become dizzy. She was observed overnight restarted atenolol and sent home. Upon returning home, got dizzy and fell to the floor and could not get up. Her sister called EMS and she was brought to [facility] ED. Presented on 03/06/23 same day of discharge.   She was given another dose of IV blockade and heart rate improved to the low 100's. Admitted for workup of dizziness/presyncope/ recurrent fall. During hospitalization found to have orthostatic hypotension with possible BPPV. Treated with IV fluids, and orthostasis improved. Imaging also revealed small SDH/SAH. Neurosurgery recommended conservative management. Eliquis was held for 7 days and now restarted. Meclizine as needed for dizziness. Anemia noted. GI consulted and recommended close monitoring . Felt likely secondary to chemotherapy/malignancy. Cardiology restarted Metoprolol and to follow up as an  outpatient. Urinary retention treated with foley and started on tamsulosin. Removed 5/26. Outpatient follow up with medical oncology.      Patient's medical record from [facility] has been reviewed by the rehabilitation admission coordinator and physician.   Past Medical History      Past Medical History:  Diagnosis Date   Anxiety     Atherosclerosis of aorta (HCC)     Bloodstream infection due to Port-A-Cath 01/15/2023   Breast cancer (HCC) 1998    (Rt) lumpectomy dx 1999; Dr. Truett Perna   Dyslipidemia, goal LDL below 130     Essential hypertension     GERD (gastroesophageal reflux disease)     Hypercholesterolemia     IBS (irritable bowel syndrome)     Lymphoma (HCC)      lymphoma dx 11/28/10 - left neck   MSSA bacteremia 01/15/2023   non hodgkins lymphoma  12/2010    right aprotid gland   Nonischemic cardiomyopathy (HCC) 2008    ? Doxorubincin induced; essentially resolved as of echo in January 2014, current EF 50-55%. Grade 1 diastolic dysfunction.   Obesity     Severe obesity (BMI >= 40) (HCC) 05/21/2013    Improve to BMI of 39 by July 2015   Tremor      Has the patient had major surgery during 100 days prior to admission? Yes   Family History   family history includes Cancer in her paternal grandmother and sister; Chronic Renal Failure in her mother; Heart Problems in her father, maternal grandmother, mother, and paternal grandfather; Heart attack in her sister; Hypertension in her brother, sister, and sister; Lupus in her brother; Migraines in her sister; Stroke in her maternal grandfather.   Current Medications   Current Facility-Administered Medications:    acetaminophen (TYLENOL) tablet 650 mg, 650 mg, Oral, Q6H PRN, Reva Bores, MD   apixaban (ELIQUIS) tablet 5 mg, 5 mg, Oral, BID, Adhikari, Amrit, MD, 5 mg at 03/21/23 0945   Chlorhexidine Gluconate Cloth 2 % PADS 6 each, 6 each, Topical, Daily, Burnadette Pop, MD, 6 each at 03/21/23 0945   ferrous sulfate tablet 325 mg, 325 mg, Oral, Q breakfast, Adhikari, Amrit, MD, 325 mg at 03/21/23 0820   folic acid (FOLVITE) tablet 1 mg, 1 mg, Oral, Daily, Reva Bores, MD, 1 mg at 03/21/23 0945   LORazepam (ATIVAN) tablet 0.5 mg, 0.5 mg, Oral, BID PRN, Reva Bores, MD, 0.5 mg at 03/19/23 2144   magnesium oxide (MAG-OX) tablet 400 mg, 400 mg, Oral, BID, Reva Bores, MD, 400 mg at 03/21/23 0945   meclizine (ANTIVERT) tablet 25 mg, 25 mg, Oral, TID PRN, Burnadette Pop, MD   metoprolol tartrate (LOPRESSOR) injection 5 mg, 5 mg, Intravenous, Q6H PRN, Reva Bores, MD   metoprolol tartrate (LOPRESSOR) tablet 37.5 mg, 37.5 mg, Oral, TID, Adhikari, Amrit, MD, 37.5 mg at 03/21/23 0945   ondansetron (ZOFRAN) tablet 4 mg, 4 mg, Oral, Q6H PRN **OR** ondansetron (ZOFRAN) injection 4 mg, 4  mg, Intravenous, Q6H PRN, Reva Bores, MD, 4 mg at 03/07/23 0225   oxyCODONE (Oxy IR/ROXICODONE) immediate release tablet 5 mg, 5 mg, Oral, Q6H PRN, Reva Bores, MD   pantoprazole (PROTONIX) EC tablet 40 mg, 40 mg, Oral, BID, Reva Bores, MD, 40 mg at 03/21/23 0945   polyethylene glycol (MIRALAX / GLYCOLAX) packet 17 g, 17 g, Oral, Daily PRN, Reva Bores, MD   potassium chloride SA (KLOR-CON M) CR tablet 40 mEq, 40 mEq, Oral, Q4H, Alekh,  Kshitiz, MD, 40 mEq at 03/21/23 0819   sodium chloride flush (NS) 0.9 % injection 10-40 mL, 10-40 mL, Intracatheter, Q12H, Adhikari, Amrit, MD, 10 mL at 03/21/23 0820   sodium chloride flush (NS) 0.9 % injection 10-40 mL, 10-40 mL, Intracatheter, PRN, Renford Dills, Amrit, MD   tamsulosin (FLOMAX) capsule 0.4 mg, 0.4 mg, Oral, Daily, Adhikari, Amrit, MD, 0.4 mg at 03/21/23 0945   Patients Current Diet:  Diet Order                  Diet - low sodium heart healthy             Diet regular Room service appropriate? Yes; Fluid consistency: Thin  Diet effective now                       Precautions / Restrictions Precautions Precautions: Fall Precaution Comments: monitor HR Restrictions Weight Bearing Restrictions: No Other Position/Activity Restrictions: dizziness with activity    Has the patient had 2 or more falls or a fall with injury in the past year? Yes   Prior Activity Level Limited Community (1-2x/wk): mod I wiht RW, supervision with showering, Mod I with ADLS   Prior Functional Level Self Care: Did the patient need help bathing, dressing, using the toilet or eating? Needed some help   Indoor Mobility: Did the patient need assistance with walking from room to room (with or without device)? Independent   Stairs: Did the patient need assistance with internal or external stairs (with or without device)? Independent   Functional Cognition: Did the patient need help planning regular tasks such as shopping or remembering to take  medications? Independent   Patient Information Are you of Hispanic, Latino/a,or Spanish origin?: A. No, not of Hispanic, Latino/a, or Spanish origin What is your race?: A. White Do you need or want an interpreter to communicate with a doctor or health care staff?: 0. No   Patient's Response To:  Health Literacy and Transportation Is the patient able to respond to health literacy and transportation needs?: Yes Health Literacy - How often do you need to have someone help you when you read instructions, pamphlets, or other written material from your doctor or pharmacy?: Never In the past 12 months, has lack of transportation kept you from medical appointments or from getting medications?: No In the past 12 months, has lack of transportation kept you from meetings, work, or from getting things needed for daily living?: No   Home Assistive Devices / Equipment Home Assistive Devices/Equipment: Environmental consultant (specify type) Home Equipment: Rollator (4 wheels), Shower seat - built in, Tub bench, Cane - single point, BSC/3in1   Prior Device Use: Indicate devices/aids used by the patient prior to current illness, exacerbation or injury? Walker   Current Functional Level Cognition   Overall Cognitive Status: Within Functional Limits for tasks assessed Orientation Level: Oriented X4 General Comments: Very pleasant and motivated    Extremity Assessment (includes Sensation/Coordination)   Upper Extremity Assessment: Overall WFL for tasks assessed  Lower Extremity Assessment: Defer to PT evaluation RLE Deficits / Details: tremors when standing, grossly  WFL, RLE Sensation:  (she denied having numbness and tingling of her BUE and BLE) LLE Deficits / Details: same as right LLE Sensation:  (she denied having numbness and tingling of her BUE and BLE)     ADLs   Overall ADL's : Needs assistance/impaired Eating/Feeding: Independent, Sitting Grooming: Set up, Sitting Grooming Details (indicate cue type and  reason): Set-up seated  at sink. Min guard standing at sink. Pt performed grooming in sitting, as well as standing at sink level, including face washing, hair combing, and teeth brushing. She required a seated rest break for added safety, given increased fatigue & BLE "tremors" Upper Body Dressing : Set up, Sitting Lower Body Dressing: Minimal assistance, Sit to/from stand Lower Body Dressing Details (indicate cue type and reason): figure 4 technique for B socks with min guard, standing with min assist Toilet Transfer: Minimal assistance, Ambulation, Rolling walker (2 wheels) Toilet Transfer Details (indicate cue type and reason): to commode, cueing for safety with RW mgmt and grab bar safety Toileting- Clothing Manipulation and Hygiene: Min guard, Sit to/from stand, Sitting/lateral lean Toileting - Clothing Manipulation Details (indicate cue type and reason): She performed seated posterior hygiene after bowel movement, with sitting emphasized for added safety, given her increased fatigue and feelings of BLE wobbliness in standing. Functional mobility during ADLs: Minimal assistance, Rolling walker (2 wheels), Cueing for safety General ADL Comments: pt reports lightheaded at EOB but no dizziness today     Mobility   Overal bed mobility: Needs Assistance Bed Mobility: Supine to Sit Rolling: Min guard Sidelying to sit: Min assist Supine to sit: Min guard, HOB elevated Sit to supine: Min guard, HOB elevated Sit to sidelying: Min assist General bed mobility comments: min guard for safety     Transfers   Overall transfer level: Needs assistance Equipment used: Rolling walker (2 wheels) Transfers: Sit to/from Stand Sit to Stand: Min assist Bed to/from chair/wheelchair/BSC transfer type:: Step pivot Stand pivot transfers: Min assist Step pivot transfers: Mod assist General transfer comment: to power up and steady, cueing for hand placement and safety     Ambulation / Gait / Stairs / Wheelchair  Mobility   Ambulation/Gait Ambulation/Gait assistance: Min guard, Min assist Gait Distance (Feet): 60 Feet (60'x2, 30'x2) Assistive device: Rolling walker (2 wheels), Rollator (4 wheels) Gait Pattern/deviations: Step-through pattern, Trunk flexed General Gait Details: Performed 60'x2 with RW and min guard.  Pt fatigued easily and required 2-3 min seated rest breaks.  Improved RW proximity with min cues to control speed as she approrached chair.  Then performed 30'x2 with rollator to allow pt to have seated rest breaks in hallway ; however, increased instability requiring min A. She required cues to "park" rollator on wall and on how to lock brakes.  With return to standing requiring mod A to stabilize and very unstable turning back toward rollator.  Advised continued use of RW until further training with rollator. Gait velocity: fast     Posture / Balance Dynamic Sitting Balance Sitting balance - Comments: Static sitting, weight shifting , reaching, eating EOB all with good balance.  Did have dizziness and sensation of falling with return to sitting after first Epley requiring max A Balance Overall balance assessment: Needs assistance, History of Falls Sitting-balance support: No upper extremity supported Sitting balance-Leahy Scale: Good Sitting balance - Comments: Static sitting, weight shifting , reaching, eating EOB all with good balance.  Did have dizziness and sensation of falling with return to sitting after first Epley requiring max A Standing balance support: Bilateral upper extremity supported, During functional activity Standing balance-Leahy Scale: Poor Standing balance comment: relies on BUE support     Special needs/care consideration DNR on acute Has complete cycle 3 carboplatin/Taxol/atezolizumab 02/22/23 for primary non small cell lung cancer dx 11/23. Frequent IV supplements of K and Mg needed as outpatient Fall precautions    Previous Home Environment  Living  Arrangements:   (sister, [name], has lived with patient since 11/23 to providde needed supervision)  Lives With: Other (Comment) (sister, [name], has lived wiht patient since 11/23. Sister's home is in [place]) Available Help at Discharge: Family, Available 24 hours/day (sister, [name], can provide 24/7 supervision level) Type of Home: House Home Layout: One level Home Access: Level entry Bathroom Shower/Tub: Walk-in shower, Engineer, manufacturing systems: Standard Bathroom Accessibility: Yes How Accessible: Accessible via walker Home Care Services: Yes Type of Home Care Services: Home PT, Home RN Home Care Agency (if known): unknown, Maybe [facility] noted 01/18/23 at d/c   Discharge Living Setting Plans for Discharge Living Setting: Patient's home (sister, [name], to be with her) Type of Home at Discharge: House Discharge Home Layout: One level Discharge Home Access: Level entry Discharge Bathroom Shower/Tub: Walk-in shower, Tub/shower unit Discharge Bathroom Toilet: Standard Discharge Bathroom Accessibility: Yes How Accessible: Accessible via walker Does the patient have any problems obtaining your medications?: No   Social/Family/Support Systems Contact Information: niece, Megab, is her POA< she is a Engineer, civil (consulting) in [employer] ED. Her children are estranged since 9/23 Anticipated Caregiver: [name], sister Anticipated Caregiver's Contact Information: see contacts; but niece, Aundra Millet is primary point of contact for all arrangements Ability/Limitations of Caregiver: [name] can provide supervision level, has had a CVA in her past Caregiver Availability: 24/7 Discharge Plan Discussed with Primary Caregiver: Yes Is Caregiver In Agreement with Plan?: Yes Does Caregiver/Family have Issues with Lodging/Transportation while Pt is in Rehab?: No   Goals Patient/Family Goal for Rehab: supervision with PT and OT Expected length of stay: ELOS 7 to 10 days Additional Information: Patietn is estranged form her daughter since  9/23. POA/point of contact is her niece, Aundra Millet Pt/Family Agrees to Admission and willing to participate: Yes Program Orientation Provided & Reviewed with Pt/Caregiver Including Roles  & Responsibilities: Yes   Decrease burden of Care through IP rehab admission: n/a   Possible need for SNF placement upon discharge: not anticipated if gets to supervision level. [name], Sister can provide supervision level.    Patient Condition: I have reviewed medical records from [facility], spoken with CM, and patient and family member. I discussed via phone for inpatient rehabilitation assessment.  Patient will benefit from ongoing PT and OT, can actively participate in 3 hours of therapy a day 5 days of the week, and can make measurable gains during the admission.  Patient will also benefit from the coordinated team approach during an Inpatient Acute Rehabilitation admission.  The patient will receive intensive therapy as well as Rehabilitation physician, nursing, social worker, and care management interventions.  Due to bladder management, bowel management, safety, skin/wound care, disease management, medication administration, pain management, and patient education the patient requires 24 hour a day rehabilitation nursing.  The patient is currently min assist overall with mobility and basic ADLs.  Discharge setting and therapy post discharge at home with home health is anticipated.  Patient has agreed to participate in the Acute Inpatient Rehabilitation Program and will admit today.   Preadmission Screen Completed By:  Clois Dupes, RN MSN 03/21/2023 10:31 AM ______________________________________________________________________   Discussed status with Dr. Natale Lay on 03/21/23 at 1031 and received approval for admission today.   Admission Coordinator:  Clois Dupes, RN MSN time [id number] Date 03/21/23    Assessment/Plan: Diagnosis: anterior falcine subdural, subarachnoid hemorrhage due to recurrent  falls Does the need for close, 24 hr/day Medical supervision in concert with the patient's rehab needs make it unreasonable for this patient  to be served in a less intensive setting? Yes Co-Morbidities requiring supervision/potential complications: Falls, BPPV, Orthostatic hypotension, sinus tachycardia, Urinary retention, Hypomagnesemia, Hypokalemia, Chronic CHF, CKD IIIa,  lung cancer, Obesity, chronic respiratory failure Due to bladder management, bowel management, safety, skin/wound care, disease management, medication administration, pain management, and patient education, does the patient require 24 hr/day rehab nursing? Yes Does the patient require coordinated care of a physician, rehab nurse, PT, OT, and SLP to address physical and functional deficits in the context of the above medical diagnosis(es)? Yes Addressing deficits in the following areas: balance, endurance, locomotion, strength, transferring, bowel/bladder control, bathing, dressing, feeding, grooming, toileting, cognition, speech, language, swallowing, and psychosocial support Can the patient actively participate in an intensive therapy program of at least 3 hrs of therapy 5 days a week? Yes The potential for patient to make measurable gains while on inpatient rehab is good Anticipated functional outcomes upon discharge from inpatient rehab: supervision PT, supervision OT, n/a SLP Estimated rehab length of stay to reach the above functional goals is: 7-10 days Anticipated discharge destination: Home 10. Overall Rehab/Functional Prognosis: good     MD Signature: Fanny Dance         Revision History

## 2023-03-21 NOTE — Progress Notes (Signed)
Inpatient Rehabilitation Admission Medication Review by a Pharmacist  A complete drug regimen review was completed for this patient to identify any potential clinically significant medication issues.  High Risk Drug Classes Is patient taking? Indication by Medication  Antipsychotic Yes Compazine- N/V  Anticoagulant Yes Apixaban- DVT/PE  Antibiotic No   Opioid Yes OxyIR- acute pain  Antiplatelet No   Hypoglycemics/insulin No   Vasoactive Medication Yes Lopressor- HTN Flomax- urinary hesitancy  Chemotherapy No   Other Yes Benadryl- itching Antivert- dizziness Protonix- GERD     Type of Medication Issue Identified Description of Issue Recommendation(s)  Drug Interaction(s) (clinically significant)     Duplicate Therapy     Allergy     No Medication Administration End Date     Incorrect Dose     Additional Drug Therapy Needed     Significant med changes from prior encounter (inform family/care partners about these prior to discharge).    Other       Clinically significant medication issues were identified that warrant physician communication and completion of prescribed/recommended actions by midnight of the next day:  No   Time spent performing this drug regimen review (minutes):  30   Vadis Slabach BS, PharmD, BCPS Clinical Pharmacist 03/21/2023 2:32 PM  Contact: [phone] after 3 PM  "Be curious, not judgmental..." -Debbora Dus
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
## 2023-03-21 NOTE — Discharge Summary (Signed)
Physician Discharge Summary  TYNLEE HOOEY ZOX:[id number] DOB: Aug 18, 1946 DOA: 03/06/2023  PCP: Blair Heys, MD  Admit date: 03/06/2023 Discharge date: 03/21/2023  Admitted From: Home Disposition: CIR  Recommendations for Outpatient Follow-up:  Follow up with CIR provider at earliest convenience  Outpatient follow-up with oncology and cardiology  follow up in ED if symptoms worsen or new appear   Home Health: No Equipment/Devices: None  Discharge Condition: Stable CODE STATUS: Full Diet recommendation: Heart healthy with fluid restriction of up to 1200 cc a day  Brief/Interim Summary: 77 y.o. female with past medical history significant for breast cancer, non-Hodgkin's lymphoma metastatic non-small cell lung cancer, HTN, CKD stage IIIa, history of DVT/PE on Eliquis, obesity, recent discharge for dizziness/palpitations likely secondary to beta-blocker withdrawal and restarted on atenolol  presented to [facility] ED on the same day of discharge on 5/14 from home via EMS for near syncopal episode.  She was admitted for workup of dizziness/presyncope/recurrent fall.  During the hospitalization, she was found to have orthostatic hypotension with possible BPPV: Treated with IV fluids; orthostatic vitals improved.  Dizziness improved.  PT recommended CIR placement.  She was also found to have small subdural/subarachnoid hemorrhage: Neurosurgery recommended conservative management; Eliquis was held for 7 days and subsequently restarted.  She is still very weak and deconditioned but stable enough to be discharged to CIR.  Discharge to CIR today if bed is available.  Discharge Diagnoses:   Orthostatic hypotension BPPV Recurrent fall Patient presenting to the ED after recurrent fall associated with dizziness.  Patient was noted to be orthostatic, positive for BPPV after PT evaluation.  Etiology likely multifactorial with orthostasis, vertigo; as well as sinus tachycardia.  Patient was  started on IV fluid and repeat orthostatic vital signs were negative on 5/18 and IV fluids were discontinued. -- Continue PT for vertigo/therapy treatment; currently recommending CIR placement -- Meclizine 25 mg p.o. 3 times daily as needed dizziness -- Fall precautions -She is still very weak and deconditioned but stable enough to be discharged to CIR.  Discharge to CIR today if bed is available.   Subdural/subarachnoid hemorrhage MRI brain performed for persistent dizziness notable for 4 mm subdural/subarachnoid hemorrhage in the anterior left falx.  Patient denied striking her head during fall at home but she does have notable area of bruising to her posterior scalp.  Neurosurgery was consulted with no recommendations regarding surgical intervention.  Patient's home Eliquis was held for 7 days now restarted.   Thrombocytopenia Normocytic anemia Positive FOBT, no reports of hematochezia or melena.  GI was consulted and recommended close monitoring with no plans for endoscopic evaluation.  Most likely secondary to effect of chemotherapy/malignancy. -- Hemoglobin stable, 8.8 today.  Monitor intermittently as an outpatient.   Sinus tachycardia Patient has been taking metoprolol for roughly 20 years but recently stopped due to concerns of hypotension on 03/02/2023.  Following discontinuation of beta-blockade, she developed palpitations.  EKG notable for sinus tachycardia and PVCs.  Suspected beta-blocker withdrawal.  She was then started on atenolol 12.5 mg twice daily but reported that she was unable to tolerate and was concerned this was attributing to her dizziness.  After discussion with cardiology, Dr. Herbie Baltimore she was restarted on metoprolol. -- Metoprolol tartrate 37.5 mg p.o. 3 times daily.  Outpatient follow-up with cardiology.   Urinary retention During hospitalization, patient developed acute urinary retention.  Foley catheter was placed and started on tamsulosin.  Patient attempted voiding  trial on 03/09/2023 which was unsuccessful and Foley reinserted. --  Tamsulosin 0.4 mg p.o. daily.  Outpatient follow-up with urology if needed. -- Foley catheter removed on 5/26, continues to void without issue -- Continue to monitor urine output closely   Hypomagnesemia Hypokalemia -Replace potassium.  Outpatient follow-up of BMP and magnesium   Chronic systolic congestive heart failure Most recent TTE 01/17/2023 with LVEF 45-50%.  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  Not on diuretics at baseline, appears euvolemic.  Follows with cardiology, Dr. Herbie Baltimore outpatient.   History of DVT/PE: -- Restarted Eliquis 5 mg p.o. twice daily on 5/23   Chronic hypoxic respiratory failure Utilizes 2 L nasal cannula at home as needed.  Currently on room air with SpO2 97%.   CKD stage IIIa Baseline creatinine 1.1, stable.   Metastatic non-small cell lung cancer Follows with medical oncology outpatient, Dr. Truett Perna.  Recently completed cycle 3 of carboplatin/Taxol/atezolizumab on 02/22/2023.  Lung mass appears stable on recent imaging. --Continue outpatient follow-up with medical oncology   Obesity -Outpatient follow-up  Discharge Instructions  Discharge Instructions     Ambulatory referral to Cardiology   Complete by: As directed    Diet - low sodium heart healthy   Complete by: As directed    Increase activity slowly   Complete by: As directed    No wound care   Complete by: As directed       Allergies as of 03/21/2023       Reactions   Simvastatin Other (See Comments)   Leg cramps   Zoloft [sertraline] Nausea Only   Codeine Other (See Comments)   Bad Headaches   Coreg Other (See Comments)   "Made my legs hurt"   Lisinopril Cough   Tizanidine Hcl Rash, Other (See Comments)   Hypotension, also        Medication List     STOP taking these medications    atenolol 25 MG tablet Commonly known as: TENORMIN       TAKE these medications    acetaminophen  325 MG tablet Commonly known as: TYLENOL Take 2 tablets (650 mg total) by mouth every 6 (six) hours as needed for mild pain or moderate pain.   Eliquis 5 MG Tabs tablet Generic drug: apixaban TAKE 1 TABLET(5 MG) BY MOUTH TWICE DAILY What changed: See the new instructions.   ferrous sulfate 325 (65 FE) MG tablet Take 1 tablet (325 mg total) by mouth daily with breakfast. Start taking on: Mar 22, 2023   folic acid 1 MG tablet Commonly known as: FOLVITE Take 1 tablet (1 mg total) by mouth daily.   LORazepam 0.5 MG tablet Commonly known as: ATIVAN Take 1 tablet (0.5 mg total) by mouth 2 (two) times daily as needed for anxiety.   magnesium oxide 400 (240 Mg) MG tablet Commonly known as: MAG-OX Take 1 tablet (400 mg total) by mouth 2 (two) times daily.   meclizine 25 MG tablet Commonly known as: ANTIVERT Take 1 tablet (25 mg total) by mouth 3 (three) times daily as needed for dizziness.   Metoprolol Tartrate 37.5 MG Tabs Take 1 tablet (37.5 mg total) by mouth 3 (three) times daily. What changed:  medication strength how much to take when to take this   oxyCODONE 5 MG immediate release tablet Commonly known as: Oxy IR/ROXICODONE Take 1 tablet (5 mg total) by mouth every 6 (six) hours as needed for severe pain.   pantoprazole 40 MG tablet Commonly known as: PROTONIX Take 1 tablet (40 mg total) by mouth 2 (two) times daily. What changed: See the  new instructions.   potassium chloride 10 MEQ tablet Commonly known as: KLOR-CON M Take 1 tablet (10 mEq total) by mouth in the morning, at noon, in the evening, and at bedtime. What changed: See the new instructions.   tamsulosin 0.4 MG Caps capsule Commonly known as: FLOMAX Take 1 capsule (0.4 mg total) by mouth daily.        Follow-up Information     [facility]. Schedule an appointment as soon as possible for a visit in 1 week(s).   Contact information: [address] [place]  [address] [phone]        Ladene Artist, MD. Schedule an appointment as soon as possible for a visit in 1 week(s).   Specialty: Oncology Contact information: [address] [place] [address] [phone]                Allergies  Allergen Reactions   Simvastatin Other (See Comments)    Leg cramps   Zoloft [Sertraline] Nausea Only   Codeine Other (See Comments)    Bad Headaches   Coreg Other (See Comments)    "Made my legs hurt"   Lisinopril Cough   Tizanidine Hcl Rash and Other (See Comments)    Hypotension, also    Consultations: GI Neurosurgery   Procedures/Studies: MR BRAIN WO CONTRAST  Result Date: 03/07/2023 CLINICAL DATA:  Syncope/presyncope, cerebrovascular cause suspected EXAM: MRI HEAD WITHOUT CONTRAST TECHNIQUE: Multiplanar, multiecho pulse sequences of the brain and surrounding structures were obtained without intravenous contrast. COMPARISON:  None Available. FINDINGS: Brain: Approximately 4 mm thick small subdural and potentially subarachnoid FLAIR hyperintensity with associated susceptibility artifact, T1 hyperintensity and diffusion restriction, suspicious for recent hemorrhage. Outside of this region, no other areas of restricted diffusion to suggest acute infarct. No midline shift. No hydrocephalus or mass lesion. Patchy T2/FLAIR hyperintensities in the white matter, nonspecific but compatible with chronic microvascular ischemic disease. Possible small remote right parietal cortical infarct. Vascular: Major arterial flow voids are maintained skull base. Skull and upper cervical spine: Normal marrow signal. Sinuses/Orbits: Right maxillary sinus mucosal thickening. No acute orbital findings. Other: Trace right mastoid effusion. IMPRESSION: Findings suspicious for recent 4 mm thick small subdural and potentially subarachnoid hemorrhage along the anterior left falx. No significant mass effect. A noncontrast CT of the head is recommended to  further characterize and establish a baseline for follow-up. These results will be called to the ordering clinician or representative by the Radiologist Assistant, and communication documented in the PACS or [employer]. Electronically Signed   By: Feliberto Harts M.D.   On: 03/07/2023 14:29   DG Chest Port 1 View  Result Date: 03/06/2023 CLINICAL DATA:  Syncope. EXAM: PORTABLE CHEST 1 VIEW COMPARISON:  Mar 05, 2023 FINDINGS: The heart size and mediastinal contours are within normal limits. There is moderate severity calcification of the aortic arch. Low lung volumes are noted. Stable mild to moderate severity right middle lobe and right lower lobe Karim, atelectasis and/or infiltrate is seen. No pleural effusion or pneumothorax is identified. Radiopaque surgical clips are seen along the lateral aspect of the right chest wall. The visualized skeletal structures are unremarkable. IMPRESSION: Low lung volumes with stable mild to moderate severity right middle lobe and right lower lobe atelectasis and/or infiltrate. Electronically Signed   By: Aram Candela M.D.   On: 03/06/2023 22:38   CT Angio Chest PE W and/or Wo Contrast  Result Date: 03/05/2023 CLINICAL DATA:  Positive D-dimer. Chest pain.  History of lung cancer. EXAM: CT ANGIOGRAPHY CHEST WITH CONTRAST TECHNIQUE: Multidetector CT imaging of the chest was performed using the standard protocol during bolus administration of intravenous contrast. Multiplanar CT image reconstructions and MIPs were obtained to evaluate the vascular anatomy. RADIATION DOSE REDUCTION: This exam was performed according to the departmental dose-optimization program which includes automated exposure control, adjustment of the mA and/or kV according to patient size and/or use of iterative reconstruction technique. CONTRAST:  75mL OMNIPAQUE IOHEXOL 350 MG/ML SOLN COMPARISON:  Chest CT 01/12/2023 FINDINGS: Cardiovascular: Satisfactory opacification of the pulmonary arteries  to the segmental level. No evidence of pulmonary embolism. Normal heart size. No pericardial effusion. There are atherosclerotic calcifications of the aorta. Right chest port has been removed in the interval. There is a new left-sided central venous catheter with distal tip ending at the brachiocephalic SVC junction. Mediastinum/Nodes: Mildly enlarged subcarinal lymph node measuring 1 cm appears unchanged. Other nonenlarged, prominent mediastinal lymph nodes appear stable. The esophagus and thyroid gland are within normal limits. Lungs/Pleura: Small partially loculated right pleural effusion appears stable. Right middle lobe central mass near the hilum appears unchanged measuring proximally 3.8 x 2.5 cm. There is some new right lower lobe peribronchial wall thickening. Linear opacities in the right middle lobe, right lower lobe and lingula appear unchanged. There is some mild patchy ground-glass opacities in both lungs. Upper Abdomen: Cholecystectomy clips are present. Musculoskeletal: There is a small fluid collection in the anterior chest wall subcutaneous tissues measuring 1.2 x 1.1 cm likely related to prior chest port. There surgical clips and skin thickening in the right breast, unchanged. Right axillary surgical clips are again noted. No acute fractures are seen. Degenerative changes affect the spine. Review of the MIP images confirms the above findings. IMPRESSION: 1. No evidence for pulmonary embolism. 2. New right lower lobe peribronchial wall thickening worrisome for bronchitis. Metastatic disease not excluded. 3. Stable small partially loculated right pleural effusion. 4. Stable right middle lobe central mass. 5. Stable mediastinal lymphadenopathy. 6. New small fluid collection in the anterior chest wall likely related to prior chest port. 7. New left-sided central venous catheter with distal tip ending at the brachiocephalic SVC junction. Aortic Atherosclerosis (ICD10-I70.0). Electronically Signed   By:  Darliss Cheney M.D.   On: 03/05/2023 22:49   CT Chest Wo Contrast  Result Date: 03/05/2023 CLINICAL DATA:  Abnormal x-ray EXAM: CT CHEST WITHOUT CONTRAST TECHNIQUE: Multidetector CT imaging of the chest was performed following the standard protocol without IV contrast. RADIATION DOSE REDUCTION: This exam was performed according to the departmental dose-optimization program which includes automated exposure control, adjustment of the mA and/or kV according to patient size and/or use of iterative reconstruction technique. COMPARISON:  Chest x-ray 03/05/2023, CT chest 01/12/2023, 09/13/2022 FINDINGS: Cardiovascular: Limited evaluation without intravenous contrast. Left-sided central venous catheter tip at the cavoatrial region. Mild aortic atherosclerosis. No aneurysm. Normal cardiac size. Trace pericardial effusion Mediastinum/Nodes: Midline trachea. No thyroid mass. Subcentimeter mediastinal lymph nodes. Decreased right paratracheal node on series 3, image 20, measuring 7 mm, previously 9 mm. Esophagus within normal limits. Lungs/Pleura: Small slightly loculated right pleural effusion with interim removal of previously noted right-sided chest tube. Fluid quantity is overall decreased compared to prior chest CT. Pleural thickening is also decreased compared to prior chest CT. Vague right middle lobe lung mass measuring about 3.4 by 1.9 cm on series 3, image 30, previously 3.6 x 2.3 cm. Mild right middle lobe air bronchograms with overall slightly improved aeration of right middle lobe compared  to the prior chest CT. Upper Abdomen: No acute finding. 13 mm hypodensity within the superior spleen series 3, image 42 better seen with contrast, stable with recent priors. Musculoskeletal: No acute or suspicious osseous abnormality. Old left ninth rib fracture. IMPRESSION: 1. Removal of right-sided chest tube since prior CT from March. Small slightly loculated right pleural effusion, overall decreased compared to the prior  chest CT with decreased pleural thickening. Slightly improved aeration of right middle lobe compared to prior chest CT. Vague right middle lobe lung mass appears slightly decreased in size compared to prior chest CT. 2. Decreased size of multiple mediastinal lymph nodes. 3. Aortic atherosclerosis. Aortic Atherosclerosis (ICD10-I70.0). Electronically Signed   By: Jasmine Pang M.D.   On: 03/05/2023 20:39   DG Chest Port 1 View  Result Date: 03/05/2023 CLINICAL DATA:  Questionable sepsis - evaluate for abnormality EXAM: PORTABLE CHEST 1 VIEW COMPARISON:  01/12/2023 FINDINGS: Interval removal of Port-A-Cath. Rounded opacity in the right mid to lower lung may reflect fluid within the fissure. Versus airspace opacity. Right pleural effusion or pleural thickening noted. Left lung clear. Heart and mediastinal contours within normal limits. Left PICC line in place with the tip in the SVC. IMPRESSION: Rounded opacity in the right mid to lower lung may reflect loculated fluid within the fissure or rounded opacity/airspace disease. Right pleural effusion versus pleural thickening. Findings could be further evaluated with chest CT if felt clinically indicated. Electronically Signed   By: Charlett Nose M.D.   On: 03/05/2023 19:14      Subjective: Patient seen and examined at bedside.  Feels okay to be discharged to CIR today.  No fever, vomiting, worsening shortness of reported.  Discharge Exam: Vitals:   03/20/23 2132 03/21/23 0446  BP: 111/74 120/76  Pulse: 95 (!) 53  Resp: 18 18  Temp: 98.2 F (36.8 C) 98 F (36.7 C)  SpO2: 96% 94%    General: Pt is alert, awake, not in acute distress.  Elderly female lying in bed.  Currently on room air. Cardiovascular: Mild intermittent bradycardia present; S1/S2 + Respiratory: bilateral decreased breath sounds at bases with some scattered crackles Abdominal: Soft, obese, NT, ND, bowel sounds + Extremities: Trace lower extremity edema; no cyanosis    The  results of significant diagnostics from this hospitalization (including imaging, microbiology, ancillary and laboratory) are listed below for reference.     Microbiology: No results found for this or any previous visit (from the past 240 hour(s)).   Labs: BNP (last 3 results) Recent Labs    11/11/22 2103 01/12/23 1845 03/06/23 2215  BNP 160.0* 315.2* 405.0*   Basic Metabolic Panel: Recent Labs  Lab 03/16/23 0500 03/17/23 0511 03/21/23 0500  NA 138 139 139  K 3.2* 3.7 3.0*  CL 103 106 106  CO2 28 28 28   GLUCOSE 92 96 92  BUN 12 13 12   CREATININE 1.06* 1.13* 1.07*  CALCIUM 7.9* 7.8* 7.9*  MG  --  1.7  --    Liver Function Tests: No results for input(s): "AST", "ALT", "ALKPHOS", "BILITOT", "PROT", "ALBUMIN" in the last 168 hours. No results for input(s): "LIPASE", "AMYLASE" in the last 168 hours. No results for input(s): "AMMONIA" in the last 168 hours. CBC: Recent Labs  Lab 03/16/23 0500 03/21/23 0500  WBC 5.3 4.0  HGB 9.8* 8.8*  HCT 30.8* 27.9*  MCV 100.3* 101.8*  PLT 164 174   Cardiac Enzymes: No results for input(s): "CKTOTAL", "CKMB", "CKMBINDEX", "TROPONINI" in the last 168 hours. BNP: Invalid  input(s): "POCBNP" CBG: No results for input(s): "GLUCAP" in the last 168 hours. D-Dimer No results for input(s): "DDIMER" in the last 72 hours. Hgb A1c No results for input(s): "HGBA1C" in the last 72 hours. Lipid Profile No results for input(s): "CHOL", "HDL", "LDLCALC", "TRIG", "CHOLHDL", "LDLDIRECT" in the last 72 hours. Thyroid function studies No results for input(s): "TSH", "T4TOTAL", "T3FREE", "THYROIDAB" in the last 72 hours.  Invalid input(s): "FREET3" Anemia work up No results for input(s): "VITAMINB12", "FOLATE", "FERRITIN", "TIBC", "IRON", "RETICCTPCT" in the last 72 hours. Urinalysis    Component Value Date/Time   COLORURINE YELLOW 03/06/2023 2215   APPEARANCEUR HAZY (A) 03/06/2023 2215   LABSPEC 1.010 03/06/2023 2215   LABSPEC 1.020  07/16/2017 1540   PHURINE 8.0 03/06/2023 2215   GLUCOSEU NEGATIVE 03/06/2023 2215   GLUCOSEU Negative 07/16/2017 1540   HGBUR MODERATE (A) 03/06/2023 2215   BILIRUBINUR NEGATIVE 03/06/2023 2215   BILIRUBINUR Negative 07/16/2017 1540   KETONESUR NEGATIVE 03/06/2023 2215   PROTEINUR NEGATIVE 03/06/2023 2215   UROBILINOGEN 0.2 07/16/2017 1540   NITRITE NEGATIVE 03/06/2023 2215   LEUKOCYTESUR SMALL (A) 03/06/2023 2215   LEUKOCYTESUR Negative 07/16/2017 1540   Sepsis Labs Recent Labs  Lab 03/16/23 0500 03/21/23 0500  WBC 5.3 4.0   Microbiology No results found for this or any previous visit (from the past 240 hour(s)).   Time coordinating discharge: 35 minutes  SIGNED:   Glade Lloyd, MD  [facility] 03/21/2023, 9:17 AM

## 2023-03-21 NOTE — Progress Notes (Signed)
    Ranelle Oyster, MD  Physician Physical Medicine and Rehabilitation   Progress Notes    Addendum   Date of Service: 03/20/2023  8:51 AM  Related encounter: ED to Hosp-Admission (Current) from 03/06/2023 in [facility]    Show:Clear all [x] Written[x] Templated[] Copied  Added by: [x] Ranelle Oyster, MD  [] Hover for details [place] PHYSICAL MEDICINE AND REHABILITATION  CONSULT SERVICE NOTE    Chart reviewed. Discussed with rehab admission coordinator. This is a 77 yo female with hx of breast ca, NHL, metastatic NSCLC who presented on 5/14 with dizziness/syncope, diarrhea. W/u notable for anterior falcine subdural/subarachnoid hemorrhage. Pt also orthostatic, +BPPV. NS consulted and recommended conservative mgt. Pt also with sinus tachycardia and urine retention. Pt now with significant deconditioning. Vertigo seems to have improved. Pt transferred yesterday with min-mod assist. She was able to ambulate at min assist 60' x 2 using RW with HR/endurance limiting.    Given the above, I feel the patient would benefit from an inpatient rehab stay to maximize her functional mobility and self-care while managing her ongoing medical issues. ELOS 7-10 days. Goals supervision.    Rehab Admissions Coordinator to follow up    Ranelle Oyster, MD, [facility] Physical Medicine & Rehabilitation Medical Director Rehabilitation Services 03/20/2023       Revision History

## 2023-03-21 NOTE — Progress Notes (Addendum)
Inpatient Rehabilitation Admissions Coordinator   I have insurance approval for CIR admit and can admit today if medically ready for d/c. I have alerted acute team and TOC as well as Dr Hanley Ben. I await medical clearance. I contacted patient and her niece, Aundra Millet, by phone and they are in agreement .  Ottie Glazier, RN, MSN Rehab Admissions Coordinator [phone] 03/21/2023 8:26 AM  She is to admit to [address] with Dr Natale Lay, Rehab MD to admit today. I will arrange [facility] transport.  Ottie Glazier, RN, MSN Rehab Admissions Coordinator [phone] 03/21/2023 10:35 AM

## 2023-03-22 ENCOUNTER — Inpatient Hospital Stay: Payer: No Typology Code available for payment source | Admitting: Nurse Practitioner

## 2023-03-22 ENCOUNTER — Inpatient Hospital Stay: Payer: No Typology Code available for payment source

## 2023-03-22 ENCOUNTER — Other Ambulatory Visit: Payer: Self-pay

## 2023-03-22 DIAGNOSIS — I629 Nontraumatic intracranial hemorrhage, unspecified: Secondary | ICD-10-CM

## 2023-03-22 DIAGNOSIS — N1831 Chronic kidney disease, stage 3a: Secondary | ICD-10-CM | POA: Diagnosis not present

## 2023-03-22 DIAGNOSIS — I5022 Chronic systolic (congestive) heart failure: Secondary | ICD-10-CM | POA: Diagnosis not present

## 2023-03-22 LAB — CBC WITH DIFFERENTIAL/PLATELET
Abs Immature Granulocytes: 0.02 10*3/uL (ref 0.00–0.07)
Basophils Absolute: 0 10*3/uL (ref 0.0–0.1)
Basophils Relative: 1 %
Eosinophils Absolute: 0.1 10*3/uL (ref 0.0–0.5)
Eosinophils Relative: 2 %
HCT: 29.3 % — ABNORMAL LOW (ref 36.0–46.0)
Hemoglobin: 9 g/dL — ABNORMAL LOW (ref 12.0–15.0)
Immature Granulocytes: 1 %
Lymphocytes Relative: 38 %
Lymphs Abs: 1.5 10*3/uL (ref 0.7–4.0)
MCH: 31 pg (ref 26.0–34.0)
MCHC: 30.7 g/dL (ref 30.0–36.0)
MCV: 101 fL — ABNORMAL HIGH (ref 80.0–100.0)
Monocytes Absolute: 0.5 10*3/uL (ref 0.1–1.0)
Monocytes Relative: 13 %
Neutro Abs: 1.9 10*3/uL (ref 1.7–7.7)
Neutrophils Relative %: 45 %
Platelets: 185 10*3/uL (ref 150–400)
RBC: 2.9 MIL/uL — ABNORMAL LOW (ref 3.87–5.11)
RDW: 18.9 % — ABNORMAL HIGH (ref 11.5–15.5)
WBC: 4.1 10*3/uL (ref 4.0–10.5)
nRBC: 0 % (ref 0.0–0.2)

## 2023-03-22 LAB — COMPREHENSIVE METABOLIC PANEL
ALT: 9 U/L (ref 0–44)
AST: 16 U/L (ref 15–41)
Albumin: 2 g/dL — ABNORMAL LOW (ref 3.5–5.0)
Alkaline Phosphatase: 82 U/L (ref 38–126)
Anion gap: 7 (ref 5–15)
BUN: 10 mg/dL (ref 8–23)
CO2: 26 mmol/L (ref 22–32)
Calcium: 8.2 mg/dL — ABNORMAL LOW (ref 8.9–10.3)
Chloride: 107 mmol/L (ref 98–111)
Creatinine, Ser: 1.16 mg/dL — ABNORMAL HIGH (ref 0.44–1.00)
GFR, Estimated: 49 mL/min — ABNORMAL LOW (ref >60)
Glucose, Bld: 95 mg/dL (ref 70–99)
Potassium: 3.6 mmol/L (ref 3.5–5.1)
Sodium: 140 mmol/L (ref 135–145)
Total Bilirubin: 0.6 mg/dL (ref 0.3–1.2)
Total Protein: 5.6 g/dL — ABNORMAL LOW (ref 6.5–8.1)

## 2023-03-22 LAB — MAGNESIUM: Magnesium: 1.7 mg/dL (ref 1.7–2.4)

## 2023-03-22 MED ORDER — CHLORHEXIDINE GLUCONATE CLOTH 2 % EX PADS
6.0000 | MEDICATED_PAD | Freq: Two times a day (BID) | CUTANEOUS | Status: DC
  Administered 2023-03-22 – 2023-03-30 (×16): 6 via TOPICAL
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
## 2023-03-22 NOTE — Discharge Instructions (Addendum)
Inpatient Rehab Discharge Instructions  Crystal [name] Discharge date and time:  03/30/23  Activities/Precautions/ Functional Status: Activity: activity as tolerated Diet: regular diet Wound Care: none needed   Functional status:  ___ No restrictions     ___ Walk up steps independently _X__ 24/7 supervision/assistance   ___ Walk up steps with assistance ___ Intermittent supervision/assistance  ___ Bathe/dress independently ___ Walk with walker     ___ Bathe/dress with assistance ___ Walk Independently    ___ Shower independently _X__ Walk with supervision.     _X__ Shower with assistance _X__ No alcohol     ___ Return to work/school ________  Special Instructions:    COMMUNITY REFERRALS UPON DISCHARGE:    Home Health:   PT AND OT [facility] WILL ARRANGE WITH IN THEIR FACILITY   Medical Equipment/Items Ordered:WHEELCHAIR                                                 Agency/Supplier:[facility]   [phone]    My questions have been answered and I understand these instructions. I will adhere to these goals and the provided educational materials after my discharge from the hospital.  Patient/Caregiver Signature _______________________________ Date __________  Clinician Signature _______________________________________ Date __________  Please bring this form and your medication list with you to all your follow-up doctor's appointments.
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
## 2023-03-22 NOTE — Plan of Care (Signed)
  Problem: RH Balance Goal: LTG Patient will maintain dynamic sitting balance (PT) Description: LTG:  Patient will maintain dynamic sitting balance with assistance during mobility activities (PT) Flowsheets (Taken 03/22/2023 1550) LTG: Pt will maintain dynamic sitting balance during mobility activities with:: Independent with assistive device  Goal: LTG Patient will maintain dynamic standing balance (PT) Description: LTG:  Patient will maintain dynamic standing balance with assistance during mobility activities (PT) Flowsheets (Taken 03/22/2023 1550) LTG: Pt will maintain dynamic standing balance during mobility activities with:: Supervision/Verbal cueing   Problem: Sit to Stand Goal: LTG:  Patient will perform sit to stand with assistance level (PT) Description: LTG:  Patient will perform sit to stand with assistance level (PT) Flowsheets (Taken 03/22/2023 1550) LTG: PT will perform sit to stand in preparation for functional mobility with assistance level: Supervision/Verbal cueing   Problem: RH Bed Mobility Goal: LTG Patient will perform bed mobility with assist (PT) Description: LTG: Patient will perform bed mobility with assistance, with/without cues (PT). Flowsheets (Taken 03/22/2023 1550) LTG: Pt will perform bed mobility with assistance level of: Independent with assistive device    Problem: RH Bed to Chair Transfers Goal: LTG Patient will perform bed/chair transfers w/assist (PT) Description: LTG: Patient will perform bed to chair transfers with assistance (PT). Flowsheets (Taken 03/22/2023 1550) LTG: Pt will perform Bed to Chair Transfers with assistance level: Supervision/Verbal cueing   Problem: RH Ambulation Goal: LTG Patient will ambulate in controlled environment (PT) Description: LTG: Patient will ambulate in a controlled environment, # of feet with assistance (PT). Flowsheets (Taken 03/22/2023 1550) LTG: Pt will ambulate in controlled environ  assist needed::  Supervision/Verbal cueing LTG: Ambulation distance in controlled environment: x50 feet with LRAD Goal: LTG Patient will ambulate in home environment (PT) Description: LTG: Patient will ambulate in home environment, # of feet with assistance (PT). Flowsheets (Taken 03/22/2023 1550) LTG: Pt will ambulate in home environ  assist needed:: Supervision/Verbal cueing LTG: Ambulation distance in home environment: x50 feet with LRAD

## 2023-03-22 NOTE — Progress Notes (Signed)
Inpatient Rehabilitation Center Individual Statement of Services  Patient Name:  Crystal [name]  Date:  03/22/2023  Welcome to the Inpatient Rehabilitation Center.  Our goal is to provide you with an individualized program based on your diagnosis and situation, designed to meet your specific needs.  With this comprehensive rehabilitation program, you will be expected to participate in at least 3 hours of rehabilitation therapies Monday-Friday, with modified therapy programming on the weekends.  Your rehabilitation program will include the following services:  Physical Therapy (PT), Occupational Therapy (OT), 24 hour per day rehabilitation nursing, Therapeutic Recreaction (TR), Neuropsychology, Care Coordinator, Rehabilitation Medicine, Nutrition Services, and Pharmacy Services  Weekly team conferences will be held on Wednesday to discuss your progress.  Your Inpatient Rehabilitation Care Coordinator will talk with you frequently to get your input and to update you on team discussions.  Team conferences with you and your family in attendance may also be held.  Expected length of stay: 7 days  Overall anticipated outcome: supervision level  Depending on your progress and recovery, your program may change. Your Inpatient Rehabilitation Care Coordinator will coordinate services and will keep you informed of any changes. Your Inpatient Rehabilitation Care Coordinator's name and contact numbers are listed  below.  The following services may also be recommended but are not provided by the Inpatient Rehabilitation Center:   Home Health Rehabiltiation Services Outpatient Rehabilitation Services    Arrangements will be made to provide these services after discharge if needed.  Arrangements include referral to agencies that provide these services.  Your insurance has been verified to be:  [employer] Your primary doctor is:  Blair Heys  Pertinent information will be shared with your doctor and  your insurance company.  Inpatient Rehabilitation Care Coordinator:  Dossie Der, [place] [phone] or Luna Glasgow  Information discussed with and copy given to patient by: Lucy Chris, 03/22/2023, 11:15 AM

## 2023-03-22 NOTE — Progress Notes (Signed)
IVT consult placed due to patient complaining of itching with PICC dressing.  This RN to bedside, site is clean, dry, intact, no redness or irritation noted. Pt reports itching started last night after dressing change but not currently bothering her.  Spoke at length with patient, since dressing is not compromised and site is not bothering her now we will hold off on changing dressing. Pt agreeable. Pt instructed to let primary RN know if itching occurs again, to re-consult and we will re-evaluate.   This patient has been receiving chemotherapy through PICC, currently on hold.

## 2023-03-22 NOTE — Plan of Care (Signed)
  Problem: RH Balance Goal: LTG Patient will maintain dynamic standing with ADLs (OT) Description: LTG:  Patient will maintain dynamic standing balance with assist during activities of daily living (OT)  Flowsheets (Taken 03/22/2023 1918) LTG: Pt will maintain dynamic standing balance during ADLs with: Supervision/Verbal cueing   Problem: Sit to Stand Goal: LTG:  Patient will perform sit to stand in prep for activites of daily living with assistance level (OT) Description: LTG:  Patient will perform sit to stand in prep for activites of daily living with assistance level (OT) Flowsheets (Taken 03/22/2023 1918) LTG: PT will perform sit to stand in prep for activites of daily living with assistance level: Supervision/Verbal cueing   Problem: RH Grooming Goal: LTG Patient will perform grooming w/assist,cues/equip (OT) Description: LTG: Patient will perform grooming with assist, with/without cues using equipment (OT) Flowsheets (Taken 03/22/2023 1918) LTG: Pt will perform grooming with assistance level of: Independent with assistive device    Problem: RH Dressing Goal: LTG Patient will perform upper body dressing (OT) Description: LTG Patient will perform upper body dressing with assist, with/without cues (OT). Flowsheets (Taken 03/22/2023 1918) LTG: Pt will perform upper body dressing with assistance level of: Independent with assistive device Goal: LTG Patient will perform lower body dressing w/assist (OT) Description: LTG: Patient will perform lower body dressing with assist, with/without cues in positioning using equipment (OT) Flowsheets (Taken 03/22/2023 1918) LTG: Pt will perform lower body dressing with assistance level of: Independent with assistive device   Problem: RH Toileting Goal: LTG Patient will perform toileting task (3/3 steps) with assistance level (OT) Description: LTG: Patient will perform toileting task (3/3 steps) with assistance level (OT)  Flowsheets (Taken 03/22/2023  1918) LTG: Pt will perform toileting task (3/3 steps) with assistance level: Supervision/Verbal cueing   Problem: RH Simple Meal Prep Goal: LTG Patient will perform simple meal prep w/assist (OT) Description: LTG: Patient will perform simple meal prep with assistance, with/without cues (OT). Flowsheets (Taken 03/22/2023 1918) LTG: Pt will perform simple meal prep with assistance level of: Supervision/Verbal cueing   Problem: RH Laundry Goal: LTG Patient will perform laundry w/assist, cues (OT) Description: LTG: Patient will perform laundry with assistance, with/without cues (OT). Flowsheets (Taken 03/22/2023 1918) LTG: Pt will perform laundry with assistance level of: Supervision/Verbal cueing   Problem: RH Light Housekeeping Goal: LTG Patient will perform light housekeeping w/assist (OT) Description: LTG: Patient will perform light housekeeping with assistance, with/without cues (OT). Flowsheets (Taken 03/22/2023 1918) LTG: Pt will perform light housekeeping with assistance level of: Supervision/Verbal cueing   Problem: RH Toilet Transfers Goal: LTG Patient will perform toilet transfers w/assist (OT) Description: LTG: Patient will perform toilet transfers with assist, with/without cues using equipment (OT) Flowsheets (Taken 03/22/2023 1918) LTG: Pt will perform toilet transfers with assistance level of: Supervision/Verbal cueing   Problem: RH Tub/Shower Transfers Goal: LTG Patient will perform tub/shower transfers w/assist (OT) Description: LTG: Patient will perform tub/shower transfers with assist, with/without cues using equipment (OT) Flowsheets (Taken 03/22/2023 1918) LTG: Pt will perform tub/shower stall transfers with assistance level of: Supervision/Verbal cueing

## 2023-03-22 NOTE — Progress Notes (Signed)
Patient ID: Crystal [name], female   DOB: 04/14/1946, 77 y.o.   MRN: [id number] Met with the patient to review rehab process, team conference and plan of care related to current situation/debility.  Patient noted she had previous infection in porta cath with removal and insertion of PICC line. Reported the PICC seems to be doing fine. Had reaction to initial chemo therapy but tolerated treatments without difficulty.  Looking into setting up assistance to help her along with her sister [name]. Anxious to get home but ready to work on conditioning to get stronger to go home.  Denied shortness of breat, has Oxygen at home, but reports hardly ever used it. Discussed secondary risk management including HTN; on lopressor. Has a blood pressure machine at home.  HLD and HF/cardiomyopathy with BNP 405; discussed HH diet and daily weight checks using the ZONE tool.  Reviewed dietary modification recommendations for increased magnesium and calcium with foods for kidney disease and management of HTN, HLD.  Continue to follow along to address educational needs to facilitate preparation for discharge. Pamelia Hoit, RN

## 2023-03-22 NOTE — Progress Notes (Addendum)
PROGRESS NOTE   Subjective/Complaints: Working in the gym this AM.  Denies SOB. Reports pain is controlled. Reports some continued intermittant itching from tape around PICC line, she says nursing to try different dressing later today. No new concerns or complaints.    Review of Systems  Constitutional:  Negative for chills and fever.  HENT:  Negative for congestion.   Eyes:  Negative for double vision.  Respiratory:  Negative for shortness of breath.   Cardiovascular:  Negative for chest pain.  Gastrointestinal:  Negative for abdominal pain, diarrhea, nausea and vomiting.  Genitourinary: Negative.   Musculoskeletal:  Negative for neck pain.  Skin:  Positive for itching.  Neurological:  Positive for weakness. Negative for sensory change and headaches.     Objective:   No results found. Recent Labs    03/21/23 0500 03/22/23 0408  WBC 4.0 4.1  HGB 8.8* 9.0*  HCT 27.9* 29.3*  PLT 174 185   Recent Labs    03/21/23 0500 03/22/23 0408  NA 139 140  K 3.0* 3.6  CL 106 107  CO2 28 26  GLUCOSE 92 95  BUN 12 10  CREATININE 1.07* 1.16*  CALCIUM 7.9* 8.2*    Intake/Output Summary (Last 24 hours) at 03/22/2023 0826 Last data filed at 03/22/2023 0700 Gross per 24 hour  Intake 490 ml  Output 0 ml  Net 490 ml     Pressure Injury 03/06/23 Buttocks Right;Medial Stage 2 -  Partial thickness loss of dermis presenting as a shallow open injury with a red, pink wound bed without slough. (Active)  03/06/23 0050  Location: Buttocks  Location Orientation: Right;Medial  Staging: Stage 2 -  Partial thickness loss of dermis presenting as a shallow open injury with a red, pink wound bed without slough.  Wound Description (Comments):   Present on Admission: Yes    Physical Exam: Vital Signs Blood pressure 121/80, pulse 100, temperature 97.8 F (36.6 C), temperature source Oral, resp. rate 18, height 5\' 5"  (1.651 m), weight  91.8 kg, SpO2 95 %.  General: Alert and oriented x 3, No apparent distress, working in gym with therapy HEENT: Head is normocephalic, MMM Neck: Supple  Heart: Borderline tachycardic rate and normal rhythm.  Chest: CTA bilaterally without wheezes, rales, or rhonchi; no distress Abdomen: Soft, non-tender, non-distended,normoactive BS Psych: Pleasant, appropriate Skin: Clean and intact without signs of breakdown Neuro: Alert and awake, follows commands, cranial nerves II through XII intact, normal speech and language Moving all 4 extremities to gravity Sensation intact light touch in all 4 extremities Musculoskeletal:  No joint swelling or tenderness noted PICC line left upper extremity appears okay- no signs of infection noted    Assessment/Plan: 1. Functional deficits which require 3+ hours per day of interdisciplinary therapy in a comprehensive inpatient rehab setting. Physiatrist is providing close team supervision and 24 hour management of active medical problems listed below. Physiatrist and rehab team continue to assess barriers to discharge/monitor patient progress toward functional and medical goals  Care Tool:  Bathing              Bathing assist       Upper Body Dressing/Undressing Upper body dressing  Upper body assist      Lower Body Dressing/Undressing Lower body dressing            Lower body assist       Toileting Toileting    Toileting assist       Transfers Chair/bed transfer  Transfers assist           Locomotion Ambulation   Ambulation assist              Walk 10 feet activity   Assist           Walk 50 feet activity   Assist           Walk 150 feet activity   Assist           Walk 10 feet on uneven surface  activity   Assist           Wheelchair     Assist               Wheelchair 50 feet with 2 turns activity    Assist            Wheelchair 150 feet  activity     Assist          Blood pressure 121/80, pulse 100, temperature 97.8 F (36.6 C), temperature source Oral, resp. rate 18, height 5\' 5"  (1.651 m), weight 91.8 kg, SpO2 95 %.  Medical Problem List and Plan: 1. Functional deficits secondary to subdural/subarachnoid hemorrhage likely traumatic due to fall, she has history of orthostatic hypotension and BPPV with recurrent falls.             -patient may shower, cover PICC line             -ELOS/Goals: 7 to 10 days, supervision with PT and OT             -Continue CIR, PT/OT  -Ambulated 145' with RW today 2.  Antithrombotics: -DVT/anticoagulation:  Pharmaceutical: Eliquis             -antiplatelet therapy:  3. Pain Management: N/A--tylenol prn available.  4. Mood/Behavior/Sleep: LCSW to follow for evaluation and support.                       --Melatonin prn for insomnia.              -antipsychotic agents: N/A 5. Neuropsych/cognition: This patient is capable of making decisions on her own behalf. 6. Skin/Wound Care: Routine pressure relief measures.  7. Fluids/Electrolytes/Nutrition: Monitor I/O.              --has had persistent issues with electrolyte abnormalities question intake/chemo related 8. BPPV: Vestibular eval and treat.  9. Recurrent hypokalemia: Will start daily supplement as was low as 2.8  and 2.9 twice since admission 10. Chronic low Mg: Mg-1.2-->1.7-->recheck in am             --continue Mg Ox. IV supplementation prn  -5/30 MG stable at 1.7  11. Metastatic non-small cell lung CA: Oxygen prn. Chemo on hold for now --pancytopenia improving. Plt improved from 84-->174. Hgb stable 8-9 range post transfusion.  --chronic hypoxic respiratory failure treated with prn oxygen -Continue outpatient follow-up with oncology 12. Malignant RML pleural effusion: Pleur X d/c 11/2022 (Dr. Irena Cords)             --CTA chest 03/05/23 w/stable small right effusion, stable RML central mass. 13. Resting tachycardia: HR in to  110's intermittently. Continue  metoprolol 37.5 mg TID --monitor for orthostatic changes --completed treatment for MSSA bacteremia  02/24 14. Anxiety: Ativan tid prn 15. chronic hypoxic respiratory failure             -She uses 2 L O2 nasal cannula as needed, currently on room air  -5/31 O2 Sats satble on RA 16. CKD 3a.  Check labs tomorrow morning  -5/30 Cr stable at 1.16  Recheck monday 17. Obesity with BMI 33             -Dietary counseling 18.  History of DVT/PE.  Continue Eliquis 5 mg twice a day 19.  Sinus tachycardia.  Continue metoprolol.  Outpatient cardiology follow-up  -5/31 continues to have sinus tachycardia, continue metoprolol, hold off on increase due to soft BP, encourage fluid intake       03/23/2023    3:54 PM 03/23/2023    2:42 PM 03/23/2023    7:24 AM  Vitals with BMI  Systolic 99 85 135  Diastolic 60 50 74  Pulse   99    20.  Urinary retention.  Foley catheter removed 5/26, she reports she is voiding without issues, continue to monitor  -Remains continent, no new GU complaints 21.  Chronic Anemia   5/30 HGB stable at 9.0, PLT stable at 185 22.  Chronic systolic CHF.  Not on baseline diuretics.  Monitor daily weights.   -5/31 Wt stable,continue to monitor Filed Weights   03/21/23 1300 03/22/23 0422 03/23/23 0332  Weight: 90.9 kg 91.8 kg 88.9 kg  23. Itching at Picc line site  -Discussed with nursing, will see if different adhesive option is available. No rash noted and itching is improved  -Continue benadryl prn  LOS: 1 days A FACE TO FACE EVALUATION WAS PERFORMED  Fanny Dance 03/22/2023, 8:26 AM

## 2023-03-22 NOTE — Progress Notes (Signed)
Inpatient Rehabilitation  Patient information reviewed and entered into eRehab system by Jozalynn Noyce M. Lehi Phifer, M.A., CCC/SLP, PPS Coordinator.  Information including medical coding, functional ability and quality indicators will be reviewed and updated through discharge.    
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
## 2023-03-22 NOTE — Progress Notes (Signed)
Initial Nutrition Assessment  DOCUMENTATION CODES:   Not applicable  INTERVENTION:  - Add -1 packet Juven BID, each packet provides 95 calories, 2.5 grams of protein (collagen), and 9.8 grams of carbohydrate (3 grams sugar); also contains 7 grams of L-arginine and L-glutamine, 300 mg vitamin C, 15 mg vitamin E, 1.2 mcg vitamin B-12, 9.5 mg zinc, 200 mg calcium, and 1.5 g  Calcium Beta-hydroxy-Beta-methylbutyrate to support wound healing  - Add Vit C daily.   - Add Zinc x14 days.   NUTRITION DIAGNOSIS:   Increased nutrient needs related to wound healing as evidenced by estimated needs.  GOAL:   Patient will meet greater than or equal to 90% of their needs  MONITOR:   PO intake  REASON FOR ASSESSMENT:   Malnutrition Screening Tool    ASSESSMENT:   77 y.o. female admits to CIR related to functional deficits secondary to subdural/subarachnoid hemorrhage. PMH includes: breast cancer, dyslipidemia, HTN, GERD, IBS, lymphoma, non hodgkins lymphoma.  Meds reviewed: ferrous sulfate, folic acid, mag-ox, klor-con. Labs reviewed: WDL.   Pt was out of room at time of assessment. Pt has eaten 65-90% of her meals today. Pt with increased nutrient needs related to wound healing. Pt is likely meeting needs with PO intakes. RD will add Juven BID, Vit C and Zinc for wound healing. Will continue to closely monitor PO intakes.   NUTRITION - FOCUSED PHYSICAL EXAM:  Attempt at f/u.  Diet Order:   Diet Order             Diet regular Room service appropriate? Yes; Fluid consistency: Thin  Diet effective now                   EDUCATION NEEDS:   Not appropriate for education at this time  Skin:  Skin Assessment: Skin Integrity Issues: Skin Integrity Issues:: Stage II Stage II: right buttocks  Last BM:  5/30 - type 5  Height:   Ht Readings from Last 1 Encounters:  03/21/23 5\' 5"  (1.651 m)    Weight:   Wt Readings from Last 1 Encounters:  03/23/23 88.9 kg    Ideal Body  Weight:     BMI:  Body mass index is 32.61 kg/m.  Estimated Nutritional Needs:   Kcal:  2200-2500 kcals  Protein:  110-125 gm  Fluid:  >/= 2.2 L  Bethann Humble, RD, LDN, CNSC.

## 2023-03-23 DIAGNOSIS — R000 Tachycardia, unspecified: Secondary | ICD-10-CM | POA: Diagnosis not present

## 2023-03-23 DIAGNOSIS — R339 Retention of urine, unspecified: Secondary | ICD-10-CM | POA: Diagnosis not present

## 2023-03-23 DIAGNOSIS — N1831 Chronic kidney disease, stage 3a: Secondary | ICD-10-CM | POA: Diagnosis not present

## 2023-03-23 DIAGNOSIS — I5022 Chronic systolic (congestive) heart failure: Secondary | ICD-10-CM | POA: Diagnosis not present

## 2023-03-23 DIAGNOSIS — L299 Pruritus, unspecified: Secondary | ICD-10-CM | POA: Diagnosis not present

## 2023-03-23 DIAGNOSIS — I629 Nontraumatic intracranial hemorrhage, unspecified: Secondary | ICD-10-CM | POA: Diagnosis not present

## 2023-03-23 MED ORDER — ZINC SULFATE 220 (50 ZN) MG PO CAPS
220.0000 mg | ORAL_CAPSULE | Freq: Every day | ORAL | Status: DC
  Administered 2023-03-23 – 2023-03-28 (×6): 220 mg via ORAL
  Filled 2023-03-23 (×6): qty 1

## 2023-03-23 MED ORDER — JUVEN PO PACK
1.0000 | PACK | Freq: Two times a day (BID) | ORAL | Status: DC
  Administered 2023-03-23 – 2023-03-28 (×2): 1 via ORAL
  Filled 2023-03-23 (×2): qty 1

## 2023-03-23 MED ORDER — VITAMIN C 500 MG PO TABS
500.0000 mg | ORAL_TABLET | Freq: Every day | ORAL | Status: DC
  Administered 2023-03-23 – 2023-03-28 (×6): 500 mg via ORAL
  Filled 2023-03-23 (×6): qty 1

## 2023-03-23 NOTE — Progress Notes (Signed)
Occupational Therapy Session Note  Patient Details  Name: Crystal [name] MRN: [id number] Date of Birth: 10/20/1946  Session 1 Today's Date: 03/23/2023 OT Individual Time: 4098-1191 OT Individual Time Calculation (min): 73 min   Session 2  Today's Date: 03/23/2023 OT Individual Time: 4782-9562 OT Individual Time Calculation (min): 40 min    Short Term Goals: Week 1:  OT Short Term Goal 1 (Week 1): STGs=LTGs based on LOS  Skilled Therapeutic Interventions/Progress Updates:    Session 1 Pt received supine with no c/o pain, agreeable to OT session. She came to EOB with (S). She completed functional mobility to the sink with mod HHA. She completed grooming tasks at the sink with set up assist. She completed 120 ft of functional mobility ot the therapy gym with the RW with min A overall- much improved confidence with DME use. She required cueing for pacing at end of mobility d/t fatigue and wanting to sit down quickly. Pt with elevated HR at end- 138 bpm. She required an extended rest break following. Pt completed standing level reciprocal tapping activity with BUE support using a 6 in step 3x10 repetitions. Activity performed to challenge dynamic standing balance and functional activity tolerance to simulate household threshold management and reduce fall risk. Pt required min A and encouragement for attempting. She completed side stepping activity in the parallel bars to simulate household mobility, she required CGA, cueing for pacing and technique. She completed 2x 10 ft R and L before fatiguing quickly and needing to take a seated rest break. Question anxiety component as increase in RR is rapid and subsides quickly upon sitting. She returned to her room via w/c. Pt was left sitting up in the recliner with all needs met, chair alarm set, and call bell within reach.    Session 2 Pt received sitting up in the w/c with no c/o pain, requesting to go outside. Pt transported outside the hospital via  w/c for energy conservation. Pt sat outside for improvement in mood, affect, and psychosocial adjustment. She completed 2x50 ft functional mobility training for dynamic balance improvement and functional activity tolerance with the RW to challenge uneven surface navigation. She required min A overall. She was fatigued and required extensive rest breaks between sets. Discussed d/c planning and engagement in IADLs. She returned inside and completed another 50 ft of functional mobility back to her room, requiring cueing for pacing and RW management. Pt was left sitting up in the recliner with all needs met, chair alarm set, and call bell within reach.     Therapy Documentation Precautions:  Precautions Precautions: Fall Precaution Comments: monitor HR Restrictions Weight Bearing Restrictions: No Other Position/Activity Restrictions: dizziness with activity  Therapy/Group: Individual Therapy  Crissie Reese 03/23/2023, 6:40 AM

## 2023-03-23 NOTE — Progress Notes (Signed)
Occupational Therapy Session Note  Patient Details  Name: Crystal [name] MRN: [id number] Date of Birth: 1946/04/24  Today's Date: 03/23/2023 OT Individual Time: 1130-1200 OT Individual Time Calculation (min): 30 min    Short Term Goals: Week 1:  OT Short Term Goal 1 (Week 1): STGs=LTGs based on LOS  Skilled Therapeutic Interventions/Progress Updates:    1:1 Pt received in the bed and when transitioning to the EOB  pt reported feeling dizzy which continued with leaning forward to ocme into standing. Suspect that is continuing to feel vestibular issues (pt reports she was treated for BBVP at [facility] with this admission). May need to be treated again.   Pt able to ambulate from room ot dayroom with RW with min A with cues to slow down. Pt HR remained 80s-90s. After seated rest break Performed activity tolerance/ endurance exercises with 3 lb dowel focus on UB. Also performed 15 sit to stands without UE support (holding a ball promoting a forward weight shift)- not reported dizziness with this exercise.   Ambulated back to her room and left sitting up in the w/c at end of session with call bell in hand.   Therapy Documentation Precautions:  Precautions Precautions: Fall Precaution Comments: monitor HR Restrictions Weight Bearing Restrictions: No Other Position/Activity Restrictions: dizziness with activity   No c/o p ain in session    Therapy/Group: Individual Therapy  [name], [name] 03/23/2023, 3:05 PM

## 2023-03-23 NOTE — Progress Notes (Signed)
PROGRESS NOTE   Subjective/Complaints: Working in the gym this AM.  Denies SOB. Reports pain is controlled. Reports some continued intermittant itching from tape around PICC line, she says nursing to try different dressing later today. No new concerns or complaints.    Review of Systems  Constitutional:  Negative for chills and fever.  HENT:  Negative for congestion.   Eyes:  Negative for double vision.  Respiratory:  Negative for shortness of breath.   Cardiovascular:  Negative for chest pain.  Gastrointestinal:  Negative for abdominal pain, diarrhea, nausea and vomiting.  Genitourinary: Negative.   Musculoskeletal:  Negative for neck pain.  Skin:  Positive for itching.  Neurological:  Positive for weakness. Negative for sensory change and headaches.     Objective:   No results found. Recent Labs    03/21/23 0500 03/22/23 0408  WBC 4.0 4.1  HGB 8.8* 9.0*  HCT 27.9* 29.3*  PLT 174 185    Recent Labs    03/21/23 0500 03/22/23 0408  NA 139 140  K 3.0* 3.6  CL 106 107  CO2 28 26  GLUCOSE 92 95  BUN 12 10  CREATININE 1.07* 1.16*  CALCIUM 7.9* 8.2*     Intake/Output Summary (Last 24 hours) at 03/23/2023 1637 Last data filed at 03/23/2023 0746 Gross per 24 hour  Intake 236 ml  Output --  Net 236 ml      Pressure Injury 03/06/23 Buttocks Right;Medial Stage 2 -  Partial thickness loss of dermis presenting as a shallow open injury with a red, pink wound bed without slough. (Active)  03/06/23 0050  Location: Buttocks  Location Orientation: Right;Medial  Staging: Stage 2 -  Partial thickness loss of dermis presenting as a shallow open injury with a red, pink wound bed without slough.  Wound Description (Comments):   Present on Admission: Yes    Physical Exam: Vital Signs Blood pressure 99/60, pulse 99, temperature 97.9 F (36.6 C), temperature source Oral, resp. rate 17, height 5\' 5"  (1.651 m), weight  88.9 kg, SpO2 94 %.  General: Alert and oriented x 3, No apparent distress, working in gym with therapy HEENT: Head is normocephalic, MMM Neck: Supple  Heart: Borderline tachycardic rate and normal rhythm.  Chest: CTA bilaterally without wheezes, rales, or rhonchi; no distress Abdomen: Soft, non-tender, non-distended,normoactive BS Psych: Pleasant, appropriate Skin: Clean and intact without signs of breakdown Neuro: Alert and awake, follows commands, cranial nerves II through XII intact, normal speech and language Moving all 4 extremities to gravity Sensation intact light touch in all 4 extremities Musculoskeletal:  No joint swelling or tenderness noted PICC line left upper extremity appears okay- no signs of infection noted    Assessment/Plan: 1. Functional deficits which require 3+ hours per day of interdisciplinary therapy in a comprehensive inpatient rehab setting. Physiatrist is providing close team supervision and 24 hour management of active medical problems listed below. Physiatrist and rehab team continue to assess barriers to discharge/monitor patient progress toward functional and medical goals  Care Tool:  Bathing    Body parts bathed by patient: Right arm, Left arm, Chest, Abdomen, Front perineal area, Buttocks, Right upper leg, Left upper leg, Right  lower leg, Left lower leg, Face         Bathing assist Assist Level: Minimal Assistance - Patient > 75%     Upper Body Dressing/Undressing Upper body dressing   What is the patient wearing?: Pull over shirt    Upper body assist Assist Level: Set up assist    Lower Body Dressing/Undressing Lower body dressing      What is the patient wearing?: Underwear/pull up, Pants     Lower body assist Assist for lower body dressing: Minimal Assistance - Patient > 75%     Toileting Toileting    Toileting assist Assist for toileting: Contact Guard/Touching assist     Transfers Chair/bed transfer  Transfers  assist     Chair/bed transfer assist level: Moderate Assistance - Patient 50 - 74%     Locomotion Ambulation   Ambulation assist      Assist level: Contact Guard/Touching assist Assistive device: Walker-rolling Max distance: 135   Walk 10 feet activity   Assist  Walk 10 feet activity did not occur: Safety/medical concerns  Assist level: Contact Guard/Touching assist Assistive device: Walker-rolling   Walk 50 feet activity   Assist Walk 50 feet with 2 turns activity did not occur: Safety/medical concerns  Assist level: Contact Guard/Touching assist Assistive device: Walker-rolling    Walk 150 feet activity   Assist Walk 150 feet activity did not occur: Safety/medical concerns         Walk 10 feet on uneven surface  activity   Assist Walk 10 feet on uneven surfaces activity did not occur: Safety/medical concerns         Wheelchair     Assist Is the patient using a wheelchair?: Yes Type of Wheelchair: Manual    Wheelchair assist level: Supervision/Verbal cueing Max wheelchair distance: 150    Wheelchair 50 feet with 2 turns activity    Assist        Assist Level: Supervision/Verbal cueing   Wheelchair 150 feet activity     Assist      Assist Level: Supervision/Verbal cueing   Blood pressure 99/60, pulse 99, temperature 97.9 F (36.6 C), temperature source Oral, resp. rate 17, height 5\' 5"  (1.651 m), weight 88.9 kg, SpO2 94 %.  Medical Problem List and Plan: 1. Functional deficits secondary to subdural/subarachnoid hemorrhage likely traumatic due to fall, she has history of orthostatic hypotension and BPPV with recurrent falls.             -patient may shower, cover PICC line             -ELOS/Goals: 7 to 10 days, supervision with PT and OT             -Continue CIR, PT/OT  -Ambulated 145' with RW today 2.  Antithrombotics: -DVT/anticoagulation:  Pharmaceutical: Eliquis             -antiplatelet therapy:  3. Pain  Management: N/A--tylenol prn available.  4. Mood/Behavior/Sleep: LCSW to follow for evaluation and support.                       --Melatonin prn for insomnia.              -antipsychotic agents: N/A 5. Neuropsych/cognition: This patient is capable of making decisions on her own behalf. 6. Skin/Wound Care: Routine pressure relief measures.  7. Fluids/Electrolytes/Nutrition: Monitor I/O.              --has had persistent issues with electrolyte abnormalities question  intake/chemo related 8. BPPV: Vestibular eval and treat.  9. Recurrent hypokalemia: Will start daily supplement as was low as 2.8  and 2.9 twice since admission 10. Chronic low Mg: Mg-1.2-->1.7-->recheck in am             --continue Mg Ox. IV supplementation prn  -5/30 MG stable at 1.7  11. Metastatic non-small cell lung CA: Oxygen prn. Chemo on hold for now --pancytopenia improving. Plt improved from 84-->174. Hgb stable 8-9 range post transfusion.  --chronic hypoxic respiratory failure treated with prn oxygen -Continue outpatient follow-up with oncology 12. Malignant RML pleural effusion: Pleur X d/c 11/2022 (Dr. Irena Cords)             --CTA chest 03/05/23 w/stable small right effusion, stable RML central mass. 13. Resting tachycardia: HR in to 110's intermittently. Continue metoprolol 37.5 mg TID --monitor for orthostatic changes --completed treatment for MSSA bacteremia  02/24 14. Anxiety: Ativan tid prn 15. chronic hypoxic respiratory failure             -She uses 2 L O2 nasal cannula as needed, currently on room air  -5/31 O2 Sats satble on RA 16. CKD 3a.  Check labs tomorrow morning  -5/30 Cr stable at 1.16  Recheck monday 17. Obesity with BMI 33             -Dietary counseling 18.  History of DVT/PE.  Continue Eliquis 5 mg twice a day 19.  Sinus tachycardia.  Continue metoprolol.  Outpatient cardiology follow-up  -5/31 continues to have sinus tachycardia, continue metoprolol, hold off on increase due to soft BP,  encourage fluid intake       03/23/2023    3:54 PM 03/23/2023    2:42 PM 03/23/2023    7:24 AM  Vitals with BMI  Systolic 99 85 135  Diastolic 60 50 74  Pulse   99    20.  Urinary retention.  Foley catheter removed 5/26, she reports she is voiding without issues, continue to monitor  -Remains continent, no new GU complaints 21.  Chronic Anemia   5/30 HGB stable at 9.0, PLT stable at 185 22.  Chronic systolic CHF.  Not on baseline diuretics.  Monitor daily weights.   -5/31 Wt stable,continue to monitor Filed Weights   03/21/23 1300 03/22/23 0422 03/23/23 0332  Weight: 90.9 kg 91.8 kg 88.9 kg  23. Itching at Picc line site  -Discussed with nursing, will see if different adhesive option is available. No rash noted and itching is improved  -Continue benadryl prn  LOS: 2 days A FACE TO FACE EVALUATION WAS PERFORMED  Fanny Dance 03/23/2023, 4:37 PM

## 2023-03-23 NOTE — Progress Notes (Addendum)
Physical Therapy Session Note  Patient Details  Name: Crystal [name] MRN: [id number] Date of Birth: 12/31/1945  Today's Date: 03/23/2023 PT Individual Time: 1001-1100 PT Individual Time Calculation (min): 59 min   Short Term Goals: Week 1:  PT Short Term Goal 1 (Week 1): STG=LTG 2/2 ELOS  Skilled Therapeutic Interventions/Progress Updates: Pt presents sitting in recliner and agreeable to therapy.  Pt transfers sit to stand w/ min A mostly 2/2 stand to sit transfers almost plopping in seat.  Pt amb x 135' w/ RW and CGA.  Pt fatigues quickly at end of gait trials.  Pt performed Nu-step at Level 3, 4' x 2 w/ rest break between for increased endurance and LE strength.  Pt averaged 46 spm and a total of 374 steps.  Pt amb x 10' w/ HHA and min to mod A to w/c.  Pt performed standing toe-taps to 3 3/4" platform w/ RW and w/ HHA.  Pt amb 80' x 3 w/ RW and CGA and verbal cues for walker management.  Pt demos LE tremors w/ WB, but no LOB.  O2 sats at 98% w/ gait, although HR increased to 114, returning to 90's.  Pt amb x 25' to recliner.  Pt remained sitting in recliner w/ LES elevated, chair alarm on and all needs in reach.     Therapy Documentation Precautions:  Precautions Precautions: Fall Precaution Comments: monitor HR Restrictions Weight Bearing Restrictions: No Other Position/Activity Restrictions: dizziness with activity General:   Vital Signs: Therapy Vitals Pulse Rate: 99 BP: 135/74 Pain:0/10 Pain Assessment Pain Scale: 0-10 Faces Pain Scale: No hurt    Therapy/Group: Individual Therapy  Lucio Edward 03/23/2023, 11:01 AM

## 2023-03-24 DIAGNOSIS — I629 Nontraumatic intracranial hemorrhage, unspecified: Secondary | ICD-10-CM | POA: Diagnosis not present

## 2023-03-24 DIAGNOSIS — C3490 Malignant neoplasm of unspecified part of unspecified bronchus or lung: Secondary | ICD-10-CM | POA: Diagnosis not present

## 2023-03-24 DIAGNOSIS — J9811 Atelectasis: Secondary | ICD-10-CM | POA: Diagnosis not present

## 2023-03-24 DIAGNOSIS — N1831 Chronic kidney disease, stage 3a: Secondary | ICD-10-CM | POA: Diagnosis not present

## 2023-03-24 DIAGNOSIS — L299 Pruritus, unspecified: Secondary | ICD-10-CM | POA: Diagnosis not present

## 2023-03-24 NOTE — Progress Notes (Signed)
PROGRESS NOTE   Subjective/Complaints: Still with itching from tape at PICC site. Otherwise doing well   Review of Systems  Constitutional:  Negative for chills and fever.  HENT:  Negative for congestion.   Eyes:  Negative for double vision.  Respiratory:  Negative for shortness of breath.   Cardiovascular:  Negative for chest pain.  Gastrointestinal:  Negative for abdominal pain, diarrhea, nausea and vomiting.  Genitourinary: Negative.   Musculoskeletal:  Negative for neck pain.  Skin:  Positive for itching.  Neurological:  Positive for weakness. Negative for sensory change and headaches.     Objective:   No results found. Recent Labs    03/22/23 0408  WBC 4.1  HGB 9.0*  HCT 29.3*  PLT 185    Recent Labs    03/22/23 0408  NA 140  K 3.6  CL 107  CO2 26  GLUCOSE 95  BUN 10  CREATININE 1.16*  CALCIUM 8.2*     Intake/Output Summary (Last 24 hours) at 03/24/2023 1129 Last data filed at 03/24/2023 0815 Gross per 24 hour  Intake 120 ml  Output --  Net 120 ml      Pressure Injury 03/06/23 Buttocks Right;Medial Stage 2 -  Partial thickness loss of dermis presenting as a shallow open injury with a red, pink wound bed without slough. (Active)  03/06/23 0050  Location: Buttocks  Location Orientation: Right;Medial  Staging: Stage 2 -  Partial thickness loss of dermis presenting as a shallow open injury with a red, pink wound bed without slough.  Wound Description (Comments):   Present on Admission: Yes    Physical Exam: Vital Signs Blood pressure 115/81, pulse (!) 104, temperature 97.6 F (36.4 C), temperature source Oral, resp. rate 17, height 5\' 5"  (1.651 m), weight 91.4 kg, SpO2 93 %.  Constitutional: No distress . Vital signs reviewed. HEENT: NCAT, EOMI, oral membranes moist Neck: supple Cardiovascular: RRR without murmur. No JVD    Respiratory/Chest: CTA Bilaterally without wheezes or rales.  Normal effort    GI/Abdomen: BS +, non-tender, non-distended Ext: no clubbing, cyanosis, or edema Psych: pleasant and cooperative  Neuro: Alert and awake, follows commands, cranial nerves II through XII intact, normal speech and language Moving all 4 extremities to gravity Sensation intact light touch in all 4 extremities Musculoskeletal:  No joint swelling or tenderness noted Skin: PICC line left upper extremity appears okay- no signs of infection noted. No irritation from tape. It is in antecubital area which likely is contributing to discomfort    Assessment/Plan: 1. Functional deficits which require 3+ hours per day of interdisciplinary therapy in a comprehensive inpatient rehab setting. Physiatrist is providing close team supervision and 24 hour management of active medical problems listed below. Physiatrist and rehab team continue to assess barriers to discharge/monitor patient progress toward functional and medical goals  Care Tool:  Bathing    Body parts bathed by patient: Right arm, Left arm, Chest, Abdomen, Front perineal area, Buttocks, Right upper leg, Left upper leg, Right lower leg, Left lower leg, Face         Bathing assist Assist Level: Minimal Assistance - Patient > 75%     Upper Body  Dressing/Undressing Upper body dressing   What is the patient wearing?: Pull over shirt    Upper body assist Assist Level: Set up assist    Lower Body Dressing/Undressing Lower body dressing      What is the patient wearing?: Underwear/pull up, Pants     Lower body assist Assist for lower body dressing: Minimal Assistance - Patient > 75%     Toileting Toileting    Toileting assist Assist for toileting: Contact Guard/Touching assist     Transfers Chair/bed transfer  Transfers assist     Chair/bed transfer assist level: Moderate Assistance - Patient 50 - 74%     Locomotion Ambulation   Ambulation assist      Assist level: Contact Guard/Touching  assist Assistive device: Walker-rolling Max distance: 135   Walk 10 feet activity   Assist  Walk 10 feet activity did not occur: Safety/medical concerns  Assist level: Contact Guard/Touching assist Assistive device: Walker-rolling   Walk 50 feet activity   Assist Walk 50 feet with 2 turns activity did not occur: Safety/medical concerns  Assist level: Contact Guard/Touching assist Assistive device: Walker-rolling    Walk 150 feet activity   Assist Walk 150 feet activity did not occur: Safety/medical concerns         Walk 10 feet on uneven surface  activity   Assist Walk 10 feet on uneven surfaces activity did not occur: Safety/medical concerns         Wheelchair     Assist Is the patient using a wheelchair?: Yes Type of Wheelchair: Manual    Wheelchair assist level: Supervision/Verbal cueing Max wheelchair distance: 150    Wheelchair 50 feet with 2 turns activity    Assist        Assist Level: Supervision/Verbal cueing   Wheelchair 150 feet activity     Assist      Assist Level: Supervision/Verbal cueing   Blood pressure 115/81, pulse (!) 104, temperature 97.6 F (36.4 C), temperature source Oral, resp. rate 17, height 5\' 5"  (1.651 m), weight 91.4 kg, SpO2 93 %.  Medical Problem List and Plan: 1. Functional deficits secondary to subdural/subarachnoid hemorrhage likely traumatic due to fall, she has history of orthostatic hypotension and BPPV with recurrent falls.             -patient may shower, cover PICC line             -ELOS/Goals: 7 to 10 days, supervision with PT and OT             -Continue CIR therapies including PT, OT  2.  Antithrombotics: -DVT/anticoagulation:  Pharmaceutical: Eliquis             -antiplatelet therapy:  3. Pain Management: N/A--tylenol prn available.  4. Mood/Behavior/Sleep: LCSW to follow for evaluation and support.                       --Melatonin prn for insomnia.              -antipsychotic  agents: N/A 5. Neuropsych/cognition: This patient is capable of making decisions on her own behalf. 6. Skin/Wound Care: Routine pressure relief measures.   -advised pt to discuss with PICC nurse regarding other tape options. Area looks fine.  7. Fluids/Electrolytes/Nutrition: Monitor I/O.              --has had persistent issues with electrolyte abnormalities question intake/chemo related 8. BPPV: Vestibular eval and treat.  9. Recurrent hypokalemia: Will start daily  supplement as was low as 2.8  and 2.9 twice since admission 10. Chronic low Mg: Mg-1.2-->1.7-->recheck in am             --continue Mg Ox. IV supplementation prn  -5/30 MG stable at 1.7  11. Metastatic non-small cell lung CA: Oxygen prn. Chemo on hold for now --pancytopenia improving. Plt improved from 84-->174. Hgb stable 8-9 range post transfusion.  --chronic hypoxic respiratory failure treated with prn oxygen -Continue outpatient follow-up with oncology 12. Malignant RML pleural effusion: Pleur X d/c 11/2022 (Dr. Irena Cords)             --CTA chest 03/05/23 w/stable small right effusion, stable RML central mass. 13. Resting tachycardia: HR in to 110's intermittently. Continue metoprolol 37.5 mg TID --monitor for orthostatic changes --completed treatment for MSSA bacteremia  02/24 14. Anxiety: Ativan tid prn 15. chronic hypoxic respiratory failure             -She uses 2 L O2 nasal cannula as needed, currently on room air  6/1 stable on RA currently 16. CKD 3a.  Check labs tomorrow morning  -5/30 Cr stable at 1.16  Recheck monday 17. Obesity with BMI 33             -Dietary counseling 18.  History of DVT/PE.  Continue Eliquis 5 mg twice a day 19.  Sinus tachycardia.  Continue metoprolol.  Outpatient cardiology follow-up  -5/31 continues to have sinus tachycardia, continue metoprolol, hold off on increase due to soft BP, encourage fluid intake       03/24/2023    5:00 AM 03/24/2023    4:49 AM 03/23/2023    8:46 PM  Vitals  with BMI  Weight 201 lbs 8 oz    BMI 33.53    Systolic  115 113  Diastolic  81 82  Pulse  104 96    20.  Urinary retention.  Foley catheter removed 5/26, she reports she is voiding without issues, continue to monitor  -Remains continent, no new GU complaints 21.  Chronic Anemia   5/30 HGB stable at 9.0, PLT stable at 185 22.  Chronic systolic CHF.  Not on baseline diuretics.  Monitor daily weights.   -5/31 Wt stable,continue to monitor Filed Weights   03/22/23 0422 03/23/23 0332 03/24/23 0500  Weight: 91.8 kg 88.9 kg 91.4 kg  23. Itching at Picc line site  -pt will discuss with PICC nurse  -Continue benadryl prn  LOS: 3 days A FACE TO FACE EVALUATION WAS PERFORMED  Ranelle Oyster 03/24/2023, 11:29 AM
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
## 2023-03-25 DIAGNOSIS — C3490 Malignant neoplasm of unspecified part of unspecified bronchus or lung: Secondary | ICD-10-CM | POA: Diagnosis not present

## 2023-03-25 DIAGNOSIS — L299 Pruritus, unspecified: Secondary | ICD-10-CM | POA: Diagnosis not present

## 2023-03-25 DIAGNOSIS — N1831 Chronic kidney disease, stage 3a: Secondary | ICD-10-CM | POA: Diagnosis not present

## 2023-03-25 DIAGNOSIS — I629 Nontraumatic intracranial hemorrhage, unspecified: Secondary | ICD-10-CM | POA: Diagnosis not present

## 2023-03-25 NOTE — Progress Notes (Signed)
Occupational Therapy Session Note  Patient Details  Name: RAENA ORTLIP MRN: [id number] Date of Birth: 1945/11/22  Today's Date: 03/25/2023 OT Individual Time: [id number] OT Individual Time Calculation (min): 75 min    Short Term Goals: Week 1:  OT Short Term Goal 1 (Week 1): STGs=LTGs based on LOS  Skilled Therapeutic Interventions/Progress Updates:    Pt semi reclined in bed, requesting to wash face and complete oral care.  Pt completed supine to sit with supervision. Needed multiple attempts to complete sit to stand with CGA at RW.  Ambulated a few steps to sink and required education and tactile cues on safe RW management in order to refrain from premature abandoning of device.  Pt requesting to sit while at sinkside due to reported "tremors" in her legs.  Pt brushed teeth and washed face seated with setup.  Pt requested to use bathroom, needing encouragement to ambulate to toilet versus initially wanting to propel using w/c.  Pt ambulated 5 feet with one step up using RW needing CGA and cues to keep RW closer to self.  Completed toilet transfer with use of grab bars with CGA.  Pt changed pull up brief in seated position with supervision.  Pt was continent of bowel and bladder (see flowsheet).  Toileting and sit to stand completed with close supervision using unilateral release from RW.  Pt ambulated to sink and returned to sitting at w/c without warning with CGA.  Pt washed hands with mod I seated.  Pt transported via w/c to ortho gym and completed stand pivot using RW to nustep.  Provided education on keeping RW entire transfer due to pt prematurely releasing UE during pivot.  Pt completed Nustep to promote strength and endurance x 10 minutes BUE/BLE propulsion with resistance level 4 and pt reported PRE of 13/20 at half point of duration.  Pt then ambulated approximately 10 feet to EOM and pivot sit with cues for RW and CGA.  Facilitated dynamic standing balance and trunk strength using bean bag  retrieve and toss game retrieving left and right lateral x 10 each.  Client needed intermittent seated breaks during game due to BLE fatigue. Returned to room via w/c transport and ambulated a few steps and stand pivot to recliner using RW with close supervision.  Call bell in reach, seat belt alarm on.    Therapy Documentation Precautions:  Precautions Precautions: Fall Precaution Comments: monitor HR Restrictions Weight Bearing Restrictions: No Other Position/Activity Restrictions: dizziness with activity    Therapy/Group: Individual Therapy  Amie Critchley 03/25/2023, 8:06 AM

## 2023-03-25 NOTE — Progress Notes (Signed)
PROGRESS NOTE   Subjective/Complaints: Left arm better after PICC nurse made some adjustments.    Review of Systems  Constitutional:  Negative for chills and fever.  HENT:  Negative for congestion.   Eyes:  Negative for double vision.  Respiratory:  Negative for shortness of breath.   Cardiovascular:  Negative for chest pain.  Gastrointestinal:  Negative for abdominal pain, diarrhea, nausea and vomiting.  Genitourinary: Negative.   Musculoskeletal:  Negative for neck pain.  Skin:  Negative for itching.  Neurological:  Positive for weakness. Negative for sensory change and headaches.     Objective:   No results found. No results for input(s): "WBC", "HGB", "HCT", "PLT" in the last 72 hours.  No results for input(s): "NA", "K", "CL", "CO2", "GLUCOSE", "BUN", "CREATININE", "CALCIUM" in the last 72 hours.   Intake/Output Summary (Last 24 hours) at 03/25/2023 1035 Last data filed at 03/25/2023 9528 Gross per 24 hour  Intake 480 ml  Output --  Net 480 ml      Pressure Injury 03/06/23 Buttocks Right;Medial Stage 2 -  Partial thickness loss of dermis presenting as a shallow open injury with a red, pink wound bed without slough. (Active)  03/06/23 0050  Location: Buttocks  Location Orientation: Right;Medial  Staging: Stage 2 -  Partial thickness loss of dermis presenting as a shallow open injury with a red, pink wound bed without slough.  Wound Description (Comments):   Present on Admission: Yes    Physical Exam: Vital Signs Blood pressure 137/79, pulse (!) 105, temperature 97.6 F (36.4 C), temperature source Oral, resp. rate 18, height 5\' 5"  (1.651 m), weight 91 kg, SpO2 99 %.  Constitutional: No distress . Vital signs reviewed. HEENT: NCAT, EOMI, oral membranes moist Neck: supple Cardiovascular: RRR without murmur. No JVD    Respiratory/Chest: CTA Bilaterally without wheezes or rales. Normal effort     GI/Abdomen: BS +, non-tender, non-distended Ext: no clubbing, cyanosis, or edema Psych: pleasant and cooperative  Neuro: Alert and awake, follows commands, cranial nerves II through XII intact, normal speech and language Moving all 4 extremities to gravity Sensation intact light touch in all 4 extremities Musculoskeletal:  No joint swelling or tenderness noted Skin: PICC site clean, no irritation LUE    Assessment/Plan: 1. Functional deficits which require 3+ hours per day of interdisciplinary therapy in a comprehensive inpatient rehab setting. Physiatrist is providing close team supervision and 24 hour management of active medical problems listed below. Physiatrist and rehab team continue to assess barriers to discharge/monitor patient progress toward functional and medical goals  Care Tool:  Bathing    Body parts bathed by patient: Right arm, Left arm, Chest, Abdomen, Front perineal area, Buttocks, Right upper leg, Left upper leg, Right lower leg, Left lower leg, Face         Bathing assist Assist Level: Minimal Assistance - Patient > 75%     Upper Body Dressing/Undressing Upper body dressing   What is the patient wearing?: Pull over shirt    Upper body assist Assist Level: Set up assist    Lower Body Dressing/Undressing Lower body dressing      What is the patient wearing?: Underwear/pull up,  Pants     Lower body assist Assist for lower body dressing: Minimal Assistance - Patient > 75%     Toileting Toileting    Toileting assist Assist for toileting: Contact Guard/Touching assist     Transfers Chair/bed transfer  Transfers assist     Chair/bed transfer assist level: Moderate Assistance - Patient 50 - 74%     Locomotion Ambulation   Ambulation assist      Assist level: Contact Guard/Touching assist Assistive device: Walker-rolling Max distance: 135   Walk 10 feet activity   Assist  Walk 10 feet activity did not occur: Safety/medical  concerns  Assist level: Contact Guard/Touching assist Assistive device: Walker-rolling   Walk 50 feet activity   Assist Walk 50 feet with 2 turns activity did not occur: Safety/medical concerns  Assist level: Contact Guard/Touching assist Assistive device: Walker-rolling    Walk 150 feet activity   Assist Walk 150 feet activity did not occur: Safety/medical concerns         Walk 10 feet on uneven surface  activity   Assist Walk 10 feet on uneven surfaces activity did not occur: Safety/medical concerns         Wheelchair     Assist Is the patient using a wheelchair?: Yes Type of Wheelchair: Manual    Wheelchair assist level: Supervision/Verbal cueing Max wheelchair distance: 150    Wheelchair 50 feet with 2 turns activity    Assist        Assist Level: Supervision/Verbal cueing   Wheelchair 150 feet activity     Assist      Assist Level: Supervision/Verbal cueing   Blood pressure 137/79, pulse (!) 105, temperature 97.6 F (36.4 C), temperature source Oral, resp. rate 18, height 5\' 5"  (1.651 m), weight 91 kg, SpO2 99 %.  Medical Problem List and Plan: 1. Functional deficits secondary to subdural/subarachnoid hemorrhage likely traumatic due to fall, she has history of orthostatic hypotension and BPPV with recurrent falls.             -patient may shower, cover PICC line             -ELOS/Goals: 7 to 10 days, supervision with PT and OT             -Continue CIR therapies including PT, OT   2.  Antithrombotics: -DVT/anticoagulation:  Pharmaceutical: Eliquis             -antiplatelet therapy:  3. Pain Management: N/A--tylenol prn available.  4. Mood/Behavior/Sleep: LCSW to follow for evaluation and support.                       --Melatonin prn for insomnia.              -antipsychotic agents: N/A 5. Neuropsych/cognition: This patient is capable of making decisions on her own behalf. 6. Skin/Wound Care: Routine pressure relief measures.    - PICC area feeling better after PICC nurse adjusted dressing.  7. Fluids/Electrolytes/Nutrition: Monitor I/O.              --has had persistent issues with electrolyte abnormalities question intake/chemo related 8. BPPV: Vestibular eval and treat.  9. Recurrent hypokalemia: Will start daily supplement as was low as 2.8  and 2.9 twice since admission 10. Chronic low Mg: Mg-1.2-->1.7-->recheck in am             --continue Mg Ox. IV supplementation prn  -5/30 MG stable at 1.7  11. Metastatic non-small cell lung  CA: Oxygen prn. Chemo on hold for now --pancytopenia improving. Plt improved from 84-->174. Hgb stable 8-9 range post transfusion.  --chronic hypoxic respiratory failure treated with prn oxygen -Continue outpatient follow-up with oncology 12. Malignant RML pleural effusion: Pleur X d/c 11/2022 (Dr. Irena Cords)             --CTA chest 03/05/23 w/stable small right effusion, stable RML central mass. 13. Resting tachycardia: HR in to 110's intermittently. Continue metoprolol 37.5 mg TID --monitor for orthostatic changes --completed treatment for MSSA bacteremia  02/24 14. Anxiety: Ativan tid prn 15. chronic hypoxic respiratory failure             -She uses 2 L O2 nasal cannula as needed, currently on room air  6/1-2 stable on RA currently 16. CKD 3a.  Check labs tomorrow morning  -5/30 Cr stable at 1.16  Recheck monday 17. Obesity with BMI 33             -Dietary counseling 18.  History of DVT/PE.  Continue Eliquis 5 mg twice a day 19.  Sinus tachycardia.  Continue metoprolol.  Outpatient cardiology follow-up  -5/31 continues to have sinus tachycardia, continue metoprolol, hold off on increase due to soft BP, encourage fluid intake       03/25/2023    5:00 AM 03/25/2023    3:29 AM 03/24/2023    7:48 PM  Vitals with BMI  Weight 200 lbs 10 oz    BMI 33.38    Systolic  137 130  Diastolic  79 78  Pulse  105     20.  Urinary retention.  Foley catheter removed 5/26, she reports she  is voiding without issues, continue to monitor  -Remains continent, no new GU complaints 21.  Chronic Anemia   5/30 HGB stable at 9.0, PLT stable at 185 22.  Chronic systolic CHF.  Not on baseline diuretics.  Monitor daily weights.   -6/2 Wt sl increased. Follow for trend. Does not appear overloaded clinically Filed Weights   03/23/23 0332 03/24/23 0500 03/25/23 0500  Weight: 88.9 kg 91.4 kg 91 kg  23. Itching at Picc line site  -improved as above  -Continue benadryl prn  LOS: 4 days A FACE TO FACE EVALUATION WAS PERFORMED  Ranelle Oyster 03/25/2023, 10:35 AM

## 2023-03-25 NOTE — Plan of Care (Signed)
  Problem: RH Ambulation Goal: LTG Patient will ambulate in controlled environment (PT) Description: LTG: Patient will ambulate in a controlled environment, # of feet with assistance (PT). Flowsheets (Taken 03/25/2023 1308) LTG: Pt will ambulate in controlled environ  assist needed:: (modified goal as initial entry was PT error) -- LTG: Ambulation distance in controlled environment: x100 feet with LRAD

## 2023-03-25 NOTE — Progress Notes (Signed)
Physical Therapy Session Note  Patient Details  Name: Crystal [name] MRN: [id number] Date of Birth: February 11, 1946  Today's Date: 03/25/2023 PT Individual Time: 1000-1100 PT Individual Time Calculation (min): 60 min   Short Term Goals: Week 1:  PT Short Term Goal 1 (Week 1): STG=LTG 2/2 ELOS  Skilled Therapeutic Interventions/Progress Updates:      Therapy Documentation Precautions:  Precautions Precautions: Fall Precaution Comments: monitor HR Restrictions Weight Bearing Restrictions: No Other Position/Activity Restrictions: dizziness with activity  Pt agreeable to PT vestibular evaluation and without complaints of pain in session.   Subjective Hx:   Onset: 1 week prior following fall   Symptoms: vertigo, ligtheadedness, nausea, visual changes, floating feeling  Aggravating factors: rolling to the right, looking up, reaching down   Easing factors: closing eyes   PMH: motion sickness following eye dilation   Objective:   Cervical Spine Cleared and WNL   VBI: negative   Ocular ROM: WNL   Smooth pursuit: negative   Saccades: negative   Head Impulse Test: positive bilaterally   Orthostatics:    Sitting: 102/76, 89    Standing: 82/65, 105   R Dix Hallpike: Positive (upbeating rotary left nystagmus)   Intervention:   R Epley Maneuver performed twice with resolutions of symptoms on final treatment   Pt requires CGA for sit to stand transfers and ambulatory transfer to bed with RW. Pt requires +2 for positional changes in bed.   Pt left seated in recliner at bedside with all needs in reach and alarm on.     Therapy/Group: Individual Therapy  Truitt Leep Truitt Leep PT, DPT  03/25/2023, 7:37 AM

## 2023-03-25 NOTE — Progress Notes (Signed)
Physical Therapy Session Note  Patient Details  Name: Crystal [name] MRN: [id number] Date of Birth: 1945/12/19  Today's Date: 03/25/2023 PT Individual Time: 1300-1415 PT Individual Time Calculation (min): 75 min   Short Term Goals: Week 1:  PT Short Term Goal 1 (Week 1): STG=LTG 2/2 ELOS  Skilled Therapeutic Interventions/Progress Updates:      Pt seated in recliner upon arrival. Pt agreeable to therapy. Pt denies any pain/dizziness. Monitored HR throughout session, HR 125 after ambulation, decreased to 108 with seated rest breaks.   Therapist assessed recall of safety with ambulation with RW and navigating turns. Pt able to verbalize need to walk within walker, not sit down until pt feels surface on back of legs and utilize B UE for controlled descent.   Pt ambulated 2x128 feet from room<>day room with RW and CGA with WC in tow, verbal cues provided to walk within the walker.  pt performed ambulatory transfer to mat table with CGA, verbal cues provided for safety. Pt performed ambulatory transfer to mat table, and recliner chair, pt demos improved carry over of safety with turns and stand<>sit initially but needs max cuing with fatigue.    Patient demonstrates increased fall risk as noted by score of   10/56 on Berg Balance Scale.  (<36= high risk for falls, close to 100%; 37-45 significant >80%; 46-51 moderate >50%; 52-55 lower >25%). Education provided on need for RW 24/7 to reduce fall risk.   Pt performed 30 sec sit<>stand with B UE support, pt able to perform 5 reps.   Pt performed 1x10 sit<>stand from mat table while holding 1# dowel rod, with min A, verbal cues provided for technique, with empahsis on controlled descent.   Pt attempted stepping over bean bags on floor with RW and min A, pt stepping around bean bag with R LE with increased SOB and HR increasing to 135, pt requesting seated rest break. HR decreased to 90 with seated rest break. Modified activity to toe taps on cone  while standing in front of mat table, with min-mod A 2/2 B LE spasms. Pt performed 5 trials of alternating toe taps, Pt able to complete 5 toe taps bilaterally prior to bilateral knee buckle. Pt reports increased heaviness in B LE 2/2 LE spasms.   Pt seated in recliner at end of session, with all needs within reach, seatbelt alarm on and sisters in the room.        Therapy Documentation Precautions:  Precautions Precautions: Fall Precaution Comments: monitor HR Restrictions Weight Bearing Restrictions: No Other Position/Activity Restrictions: dizziness with activity Balance: Balance Balance Assessed: Yes Standardized Balance Assessment Standardized Balance Assessment: Berg Balance Test Berg Balance Test Sit to Stand: Able to stand  independently using hands Standing Unsupported: Unable to stand 30 seconds unassisted (instability 2/2 B LE tremors) Sitting with Back Unsupported but Feet Supported on Floor or Stool: Able to sit safely and securely 2 minutes Stand to Sit: Sits independently, has uncontrolled descent Transfers: Needs one person to assist Standing Unsupported with Eyes Closed: Needs help to keep from falling Standing Ubsupported with Feet Together: Needs help to attain position and unable to hold for 15 seconds (pt unable to attain position with assist) From Standing, Reach Forward with Outstretched Arm: Loses balance while trying/requires external support From Standing Position, Pick up Object from Floor: Unable to try/needs assist to keep balance From Standing Position, Turn to Look Behind Over each Shoulder: Needs supervision when turning Turn 360 Degrees: Needs assistance while turning (Pt unable  to turn without falling) Standing Unsupported, Alternately Place Feet on Step/Stool: Needs assistance to keep from falling or unable to try Standing Unsupported, One Foot in Front: Loses balance while stepping or standing Standing on One Leg: Unable to try or needs assist  to prevent fall Total Score: 10   Therapy/Group: Individual Therapy  [facility] Ambrose Finland, [place], DPT  03/25/2023, 7:56 AM

## 2023-03-26 ENCOUNTER — Inpatient Hospital Stay (HOSPITAL_COMMUNITY): Payer: No Typology Code available for payment source

## 2023-03-26 DIAGNOSIS — I629 Nontraumatic intracranial hemorrhage, unspecified: Secondary | ICD-10-CM | POA: Diagnosis not present

## 2023-03-26 DIAGNOSIS — R918 Other nonspecific abnormal finding of lung field: Secondary | ICD-10-CM | POA: Diagnosis not present

## 2023-03-26 LAB — BASIC METABOLIC PANEL
Anion gap: 6 (ref 5–15)
BUN: 11 mg/dL (ref 8–23)
CO2: 27 mmol/L (ref 22–32)
Calcium: 8.1 mg/dL — ABNORMAL LOW (ref 8.9–10.3)
Chloride: 107 mmol/L (ref 98–111)
Creatinine, Ser: 1.19 mg/dL — ABNORMAL HIGH (ref 0.44–1.00)
GFR, Estimated: 47 mL/min — ABNORMAL LOW (ref >60)
Glucose, Bld: 89 mg/dL (ref 70–99)
Potassium: 3.4 mmol/L — ABNORMAL LOW (ref 3.5–5.1)
Sodium: 140 mmol/L (ref 135–145)

## 2023-03-26 LAB — CBC
HCT: 28.3 % — ABNORMAL LOW (ref 36.0–46.0)
Hemoglobin: 8.6 g/dL — ABNORMAL LOW (ref 12.0–15.0)
MCH: 30.9 pg (ref 26.0–34.0)
MCHC: 30.4 g/dL (ref 30.0–36.0)
MCV: 101.8 fL — ABNORMAL HIGH (ref 80.0–100.0)
Platelets: 186 10*3/uL (ref 150–400)
RBC: 2.78 MIL/uL — ABNORMAL LOW (ref 3.87–5.11)
RDW: 18.9 % — ABNORMAL HIGH (ref 11.5–15.5)
WBC: 4.1 10*3/uL (ref 4.0–10.5)
nRBC: 0 % (ref 0.0–0.2)

## 2023-03-26 MED ORDER — TUBERCULIN PPD 5 UNIT/0.1ML ID SOLN
5.0000 [IU] | Freq: Once | INTRADERMAL | Status: AC
  Administered 2023-03-26: 5 [IU] via INTRADERMAL
  Filled 2023-03-26: qty 0.1

## 2023-03-26 MED ORDER — POTASSIUM CHLORIDE CRYS ER 20 MEQ PO TBCR
40.0000 meq | EXTENDED_RELEASE_TABLET | Freq: Once | ORAL | Status: AC
  Administered 2023-03-26: 40 meq via ORAL
  Filled 2023-03-26: qty 2
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
## 2023-03-26 NOTE — Progress Notes (Signed)
PROGRESS NOTE   Subjective/Complaints: Discussed hx in additional to lung ca, has been treated for lymphoma in 2014 and breast ca 1999  Discussed lumbar MRI- denies back pain   Per PT pt with "wobbly legs during stanidng " as well as tachycardia    Review of Systems  Constitutional:  Negative for chills and fever.  HENT:  Negative for congestion.   Eyes:  Negative for double vision.  Respiratory:  Negative for shortness of breath.   Cardiovascular:  Negative for chest pain.  Gastrointestinal:  Negative for abdominal pain, diarrhea, nausea and vomiting.  Genitourinary: Negative.   Musculoskeletal:  Negative for neck pain.  Skin:  Negative for itching.  Neurological:  Positive for weakness. Negative for sensory change and headaches.     Objective:   No results found. Recent Labs    03/26/23 0348  WBC 4.1  HGB 8.6*  HCT 28.3*  PLT 186    Recent Labs    03/26/23 0348  NA 140  K 3.4*  CL 107  CO2 27  GLUCOSE 89  BUN 11  CREATININE 1.19*  CALCIUM 8.1*     Intake/Output Summary (Last 24 hours) at 03/26/2023 0905 Last data filed at 03/26/2023 1610 Gross per 24 hour  Intake 480 ml  Output --  Net 480 ml      Pressure Injury 03/06/23 Buttocks Right;Medial Stage 2 -  Partial thickness loss of dermis presenting as a shallow open injury with a red, pink wound bed without slough. (Active)  03/06/23 0050  Location: Buttocks  Location Orientation: Right;Medial  Staging: Stage 2 -  Partial thickness loss of dermis presenting as a shallow open injury with a red, pink wound bed without slough.  Wound Description (Comments):   Present on Admission: Yes    Physical Exam: Vital Signs Blood pressure (!) 148/94, pulse (!) 104, temperature 98.1 F (36.7 C), temperature source Oral, resp. rate 16, height 5\' 5"  (1.651 m), weight 90.9 kg, SpO2 94 %.   General: No acute distress Mood and affect are  appropriate Heart: Regular rate and rhythm no rubs murmurs or extra sounds Lungs: Clear to auscultation, breathing unlabored, no rales or wheezes Abdomen: Positive bowel sounds, soft nontender to palpation, nondistended Extremities: No clubbing, cyanosis, or edema Skin: No evidence of breakdown, no evidence of rash  Ext: no clubbing, cyanosis, or edema Psych: pleasant and cooperative  Neuro: Alert and awake, follows commands, cranial nerves II through XII intact, normal speech and language Moving all 4 extremities to gravity Sensation intact light touch in all 4 extremities Musculoskeletal:  No joint swelling or tenderness noted Skin: PICC site clean, no irritation LUE    Assessment/Plan: 1. Functional deficits which require 3+ hours per day of interdisciplinary therapy in a comprehensive inpatient rehab setting. Physiatrist is providing close team supervision and 24 hour management of active medical problems listed below. Physiatrist and rehab team continue to assess barriers to discharge/monitor patient progress toward functional and medical goals  Care Tool:  Bathing    Body parts bathed by patient: Right arm, Left arm, Chest, Abdomen, Front perineal area, Buttocks, Right upper leg, Left upper leg, Right lower leg, Left lower leg,  Face         Bathing assist Assist Level: Minimal Assistance - Patient > 75%     Upper Body Dressing/Undressing Upper body dressing   What is the patient wearing?: Pull over shirt    Upper body assist Assist Level: Set up assist    Lower Body Dressing/Undressing Lower body dressing      What is the patient wearing?: Underwear/pull up, Pants     Lower body assist Assist for lower body dressing: Minimal Assistance - Patient > 75%     Toileting Toileting    Toileting assist Assist for toileting: Contact Guard/Touching assist     Transfers Chair/bed transfer  Transfers assist     Chair/bed transfer assist level: Moderate  Assistance - Patient 50 - 74%     Locomotion Ambulation   Ambulation assist      Assist level: Contact Guard/Touching assist Assistive device: Walker-rolling Max distance: 135   Walk 10 feet activity   Assist  Walk 10 feet activity did not occur: Safety/medical concerns  Assist level: Contact Guard/Touching assist Assistive device: Walker-rolling   Walk 50 feet activity   Assist Walk 50 feet with 2 turns activity did not occur: Safety/medical concerns  Assist level: Contact Guard/Touching assist Assistive device: Walker-rolling    Walk 150 feet activity   Assist Walk 150 feet activity did not occur: Safety/medical concerns         Walk 10 feet on uneven surface  activity   Assist Walk 10 feet on uneven surfaces activity did not occur: Safety/medical concerns         Wheelchair     Assist Is the patient using a wheelchair?: Yes Type of Wheelchair: Manual    Wheelchair assist level: Supervision/Verbal cueing Max wheelchair distance: 150    Wheelchair 50 feet with 2 turns activity    Assist        Assist Level: Supervision/Verbal cueing   Wheelchair 150 feet activity     Assist      Assist Level: Supervision/Verbal cueing   Blood pressure (!) 148/94, pulse (!) 104, temperature 98.1 F (36.7 C), temperature source Oral, resp. rate 16, height 5\' 5"  (1.651 m), weight 90.9 kg, SpO2 94 %.  Medical Problem List and Plan: 1. Functional deficits secondary to subdural/subarachnoid hemorrhage likely traumatic due to fall, she has history of orthostatic hypotension and BPPV with recurrent falls.             -patient may shower, cover PICC line             -ELOS/Goals: 7 to 10 days, supervision with PT and OT             -Continue CIR therapies including PT, OT   2.  Antithrombotics: -DVT/anticoagulation:  Pharmaceutical: Eliquis             -antiplatelet therapy:  3. Pain Management: N/A--tylenol prn available.  4.  Mood/Behavior/Sleep: LCSW to follow for evaluation and support.                       --Melatonin prn for insomnia.              -antipsychotic agents: N/A 5. Neuropsych/cognition: This patient is capable of making decisions on her own behalf. 6. Skin/Wound Care: Routine pressure relief measures.   - PICC area feeling better after PICC nurse adjusted dressing.  7. Fluids/Electrolytes/Nutrition: Monitor I/O.              --  has had persistent issues with electrolyte abnormalities question intake/chemo related 8. BPPV: Vestibular eval and treat.  9. Recurrent hypokalemia: Will start daily supplement as was low as 2.8  and 2.9 twice since admission 10. Chronic low Mg: Mg-1.2-->1.7-->recheck in am             --continue Mg Ox. IV supplementation prn  -5/30 MG stable at 1.7  11. Metastatic non-small cell lung CA: Oxygen prn. Chemo on hold for now --pancytopenia improving. Plt improved from 84-->174. Hgb stable 8-9 range post transfusion.  --chronic hypoxic respiratory failure treated with prn oxygen -Continue outpatient follow-up with oncology 12. Malignant RML pleural effusion: Pleur X d/c 11/2022 (Dr. Irena Cords)             --CTA chest 03/05/23 w/stable small right effusion, stable RML central mass. Prior hx of lymphoma treated with chemo 2014 13. Resting tachycardia: HR in to 110's intermittently. Continue metoprolol 37.5 mg TID --monitor for orthostatic changes --completed treatment for MSSA bacteremia  02/24 14. Anxiety: Ativan tid prn 15. chronic hypoxic respiratory failure             -She uses 2 L O2 nasal cannula as needed, currently on room air  6/1-2 stable on RA currently 16. CKD 3a.  Check labs tomorrow morning  -5/30 Cr stable at 1.16      Latest Ref Rng & Units 03/26/2023    3:48 AM 03/22/2023    4:08 AM 03/21/2023    5:00 AM  BMP  Glucose 70 - 99 mg/dL 89  95  92   BUN 8 - 23 mg/dL 11  10  12    Creatinine 0.44 - 1.00 mg/dL 9.14  7.82  9.56   Sodium 135 - 145 mmol/L 140  140   139   Potassium 3.5 - 5.1 mmol/L 3.4  3.6  3.0   Chloride 98 - 111 mmol/L 107  107  106   CO2 22 - 32 mmol/L 27  26  28    Calcium 8.9 - 10.3 mg/dL 8.1  8.2  7.9   Supplement KCL x 1   17. Obesity with BMI 33             -Dietary counseling 18.  History of DVT/PE.  Continue Eliquis 5 mg twice a day 19.  Sinus tachycardia.  Continue metoprolol.  Outpatient cardiology follow-up-likely multifactorial will enc fluids,   -5/31 continues to have sinus tachycardia, continue metoprolol, hold off on increase due to soft BP, encourage fluid intake Mildly reduced systolic fxn, no severe valvular disease , aortic rood plaque        03/26/2023    4:02 AM 03/25/2023    9:44 PM 03/25/2023    9:28 PM  Vitals with BMI  Weight 200 lbs 6 oz    BMI 33.35    Systolic 148 90 92  Diastolic 94 60 53  Pulse 104 76 101    20.  Urinary retention.  Foley catheter removed 5/26, she reports she is voiding without issues, continue to monitor  -Remains continent, no new GU complaints 21.  Chronic Anemia   5/30 HGB stable at 9.0, PLT stable at 185 22.  Chronic systolic CHF.  Not on baseline diuretics.  Monitor daily weights.   -6/2 Wt sl increased. Follow for trend. Does not appear overloaded clinically Filed Weights   03/24/23 0500 03/25/23 0500 03/26/23 0402  Weight: 91.4 kg 91 kg 90.9 kg  23. Itching at Picc line site  -improved as above  -  Continue benadryl prn 24. Hx thoracic spine mets but not in epidural space, also severe degenerative lumbar stenosis and severe lumbar spondylosis - spine stable no back pain - legs are shaky  when standing, no focal weakness  LOS: 5 days A FACE TO FACE EVALUATION WAS PERFORMED  Erick Colace 03/26/2023, 9:05 AM

## 2023-03-26 NOTE — Progress Notes (Addendum)
Physical Therapy Session Note  Patient Details  Name: Crystal [name] MRN: [id number] Date of Birth: 1945/10/25  Today's Date: 03/26/2023 PT Individual Time: 0909-1000, 1300-1415 PT Individual Time Calculation (min): 51 min, 75 min  Missed Time: 9 min (therapist with another pt)   Short Term Goals: Week 1:  PT Short Term Goal 1 (Week 1): STG=LTG 2/2 ELOS  Skilled Therapeutic Interventions/Progress Updates:     Treatment Session 1  Pt supine in bed upon arrival. Pt agreeable to therapy. Pt denies any pain.   Therapist brought 18x18 wheelchair to room to assess fit for pt, pt demos and reports proper fit with 20x16. Notified social work  Pt ambulated with RW and CGA from room to main gym, and performed ambulatory transfer to mat table with improved safety with walking within RW and turning to sit.   Pt performed 1x5 step ups leading with L and 1x5 step ups leading with the R with B handrail and CGA. Seated rest break provided for SOB/fatigue.   Pt ascended 1x 3 steps with B UE support on L ascending handrail and CGA/min A as pt demos 1 episode of B UE buckling.   Pt reports need to use bathroom. Pt amb 1x10 feet to bathroom with RW and min A, and pt demos decreased safety with urge., pt requiring max cuing for safety with RW and ambulatory transfer 2/2 fatigue/urgency. Pt continent of bowel and bladder (documented in flowsheets). Pt performed pericare while seated with supervision. Pt donned pants with light min A while seated, pt performed sit<>stand with CGA, and pt pulled up pants in standing with mod A, as pt very fatigued, SOB, and B LE tremoring. Education provided to take seated rest break when fatigued. HR increased to 144, therapist provided seated rest break until HR decreased to 110. Pt ambulated x10 feet with RW and min A to WC, verbal cues provided to slow down, and for safety with RW.   Therapist monitored pt HR and oxygen throughout. Pt HR increased to 144 with toileting,  donning/doffing clothing. Education provided to pt to take sustained seated rest breaks as needed for fatigue/SOB, as pt demos decreased safety, and increased urgency when fatigued/B LE muscle tremors. Education provided on tendency for pt B LE muscle tremors to be more prevalent with increased fatigue, education provided to use tremors as an indicator to take rest break. Pt verbalized understanding.   Pt seated in WC at end of session with all needs within reach and seatbelt alarm on.   Treatment Session 2   Pt supine in bed upon arrival. Pt agreeable to therpay. Pt denies any pain.   Pt performed stand pivot transfer bed<>WC with RW and min A to WC, as pt diving into chair versus using safety strategies previously taught.   Pt transported dependent in [facility] to day room for time/energy conservation. Pt performed stand pivot transfer WC to mat table with RW and CGA, with empahsis on safety with RW with turning, not sitting down until the backs of both legs are touching the surface and use of B UE for controlled descent. Pt demos improved technique.   Pt performed mass practice of walking between mat table and wheelchair positioned 10 feet away and performing ambulatory transfer with RW, pt performed with CGA, with mod verbal instructional/questioning cues. Pt demos improved technique but inconsistent with fatigue.   Pt performed sit<>stand x10 with RW and close supervision with verbal cues provided for use of B UE for controlled descent.  Pt ambulated around day room and nursing station with RW and close supervisoin and performed ambulatory transfer to mat table with improved technique, verbal cues provided to slow down with ambulation as pt walks very quickly and gets SOB.   Pt ambulated with RW and CGA while weaving in and out of cones, verbal cues provided for safety with RW.   Pt requesting to go to bed. Pt performed squat pivot transfer with RW and supervision, with improved technique. Will  assess carryover tomorrow.   Pt supine in bed at end of session with all needs within reach and bed alarm on.   Therapy Documentation Precautions:  Precautions Precautions: Fall Precaution Comments: monitor HR Restrictions Weight Bearing Restrictions: No Other Position/Activity Restrictions: dizziness with activity Therapy/Group: Individual Therapy  [facility] Ambrose [place], Farmer, DPT  03/26/2023, 7:47 AM

## 2023-03-26 NOTE — Progress Notes (Signed)
Physical Therapy Session Note  Patient Details  Name: Crystal [name] MRN: [id number] Date of Birth: [birth year]  Today's Date: 03/26/2023 PT Individual Time: 1300-1400 PT Individual Time Calculation (min): 60 min   Short Term Goals: Week 1:  PT Short Term Goal 1 (Week 1): STG=LTG 2/2 ELOS  Skilled Therapeutic Interventions/Progress Updates: Pt presents in BR, handed off from NT.  Pt required assist for clothing management.  Pt amb from BR to w/c w/ min A across ramped entrance and then CGA.  Pt requires verbal cues for safe approach including turn to w/c.  Pt wheeled to dayroom for energy conservation.  Pt amb multiple trials of 180' w/ RW and CGA, but verbal cueing for posture, speed and walker management.  Pt c/o extreme fatigue at conclusion of gait trial and puts herself into w/c before completing turn.  Pt requires extended seated rest for HR to decrease from upper 130's to 110 bpm w/ O2 sats remaining upper 90's.  Pt returned to room and remained sitting in w/c for next session, all needs in reach.     Therapy Documentation Precautions:  Precautions Precautions: Fall Precaution Comments: monitor HR Restrictions Weight Bearing Restrictions: No Other Position/Activity Restrictions: dizziness with activity General: PT Amount of Missed Time (min): 9 Minutes PT Missed Treatment Reason:  (therapist with another pt) Vital Signs:  Pain:0/10       Therapy/Group: Individual Therapy  Lucio Edward 03/26/2023, 2:03 PM

## 2023-03-26 NOTE — Plan of Care (Signed)
  Problem: RH Ambulation Goal: LTG Patient will ambulate in controlled environment (PT) Description: LTG: Patient will ambulate in a controlled environment, # of feet with assistance (PT). Flowsheets (Taken 03/26/2023 0748) LTG: Pt will ambulate in controlled environ  assist needed:: Supervision/Verbal cueing

## 2023-03-26 NOTE — Progress Notes (Signed)
Patient ID: Crystal [name], female   DOB: 06-Apr-1946, 77 y.o.   MRN: [id number]  Spoke with [name]-niece-POA who report they have a ALF bed for pt on Friday 6/7. She is in agreement with this. Will ned TB test ordered and get other paperwork needed for this transition. Have messaged PA-MD to order TB test.

## 2023-03-26 NOTE — Progress Notes (Signed)
Occupational Therapy Session Note  Patient Details  Name: Crystal [name] MRN: [id number] Date of Birth: [birth year]  Today's Date: 03/26/2023 OT Individual Time: 1015-1045 OT Individual Time Calculation (min): 30 min    Short Term Goals: Week 1:  OT Short Term Goal 1 (Week 1): STGs=LTGs based on LOS  Skilled Therapeutic Interventions/Progress Updates:    Pt received sitting with no c/o pain, agreeable to OT session. She completed functional mobility to the sink with CGA using the RW. She completed grooming tasks seated with set up assist. She completed 100 ft of functional mobility to the therapy gym using the RW with CGA. Cueing for pacing. She required seated rest breaks throughout session 2/2 fatigue. With BUE support on one of the two parallel bars she completed unilateral hip flexion activity with focus on balance in isolated LE stance and functional activity tolerance for carryover to ADLs and IADLs. Cueing for upright posture and forward gaze. She had increased fatigue and required prolonged rest breaks following. 2nd trial she completed reciprocal stepping to challenge dynamic weight shift and trunk control. CGA overall provided. She returned to her room via w/c. Pt was left sitting up in the recliner with all needs met, chair alarm set, and call bell within reach.    Therapy Documentation Precautions:  Precautions Precautions: Fall Precaution Comments: monitor HR Restrictions Weight Bearing Restrictions: No Other Position/Activity Restrictions: dizziness with activity  Therapy/Group: Individual Therapy  Crissie Reese 03/26/2023, 6:37 AM

## 2023-03-27 DIAGNOSIS — I629 Nontraumatic intracranial hemorrhage, unspecified: Secondary | ICD-10-CM | POA: Diagnosis not present

## 2023-03-27 DIAGNOSIS — J9811 Atelectasis: Secondary | ICD-10-CM

## 2023-03-27 DIAGNOSIS — N1831 Chronic kidney disease, stage 3a: Secondary | ICD-10-CM | POA: Diagnosis not present

## 2023-03-27 DIAGNOSIS — C3490 Malignant neoplasm of unspecified part of unspecified bronchus or lung: Secondary | ICD-10-CM | POA: Diagnosis not present

## 2023-03-27 LAB — POTASSIUM: Potassium: 4 mmol/L (ref 3.5–5.1)

## 2023-03-27 MED ORDER — METOPROLOL TARTRATE 50 MG PO TABS
50.0000 mg | ORAL_TABLET | Freq: Three times a day (TID) | ORAL | Status: DC
  Administered 2023-03-27 – 2023-03-30 (×9): 50 mg via ORAL
  Filled 2023-03-27 (×9): qty 1

## 2023-03-27 NOTE — Progress Notes (Signed)
PROGRESS NOTE   Subjective/Complaints:  Pt up with PT early this morning. Went outside. Felt that she did well. Asked about her CXR results.    Review of Systems  Constitutional:  Negative for chills and fever.  HENT:  Negative for congestion.   Eyes:  Negative for double vision.  Respiratory:  Negative for shortness of breath.   Cardiovascular:  Negative for chest pain.  Gastrointestinal:  Negative for abdominal pain, diarrhea, nausea and vomiting.  Genitourinary: Negative.   Musculoskeletal:  Negative for back pain and neck pain.  Skin:  Negative for itching.  Neurological:  Positive for weakness. Negative for sensory change and headaches.     Objective:   DG Chest 2 View  Result Date: 03/26/2023 CLINICAL DATA:  Follow-up exam. EXAM: CHEST - 2 VIEW COMPARISON:  03/06/2023. FINDINGS: Unchanged left upper extremity PICC with tip projecting over the superior cavoatrial junction. Unchanged bandlike opacities in the right lower lung, favored to reflect atelectasis and/or scarring. No consolidation or pulmonary edema. Stable cardiac and mediastinal contours. No pleural effusion or pneumothorax. IMPRESSION: Unchanged bandlike opacities in the right lower lung, favored to reflect atelectasis and/or scarring. Electronically Signed   By: Orvan Falconer M.D.   On: 03/26/2023 16:16   Recent Labs    03/26/23 0348  WBC 4.1  HGB 8.6*  HCT 28.3*  PLT 186    Recent Labs    03/26/23 0348 03/27/23 0328  NA 140  --   K 3.4* 4.0  CL 107  --   CO2 27  --   GLUCOSE 89  --   BUN 11  --   CREATININE 1.19*  --   CALCIUM 8.1*  --      Intake/Output Summary (Last 24 hours) at 03/27/2023 0920 Last data filed at 03/27/2023 0734 Gross per 24 hour  Intake 360 ml  Output --  Net 360 ml      Pressure Injury 03/06/23 Buttocks Right;Medial Stage 2 -  Partial thickness loss of dermis presenting as a shallow open injury with a red, pink  wound bed without slough. (Active)  03/06/23 0050  Location: Buttocks  Location Orientation: Right;Medial  Staging: Stage 2 -  Partial thickness loss of dermis presenting as a shallow open injury with a red, pink wound bed without slough.  Wound Description (Comments):   Present on Admission: Yes    Physical Exam: Vital Signs Blood pressure (!) 132/99, pulse (!) 102, temperature 98.3 F (36.8 C), temperature source Oral, resp. rate 17, height 5\' 5"  (1.651 m), weight 89.1 kg, SpO2 96 %.   Constitutional: No distress . Vital signs reviewed. HEENT: NCAT, EOMI, oral membranes moist Neck: supple Cardiovascular: RRR without murmur. No JVD    Respiratory/Chest: Decreased sounds right base? Otherwise clear. Normal effort    GI/Abdomen: BS +, non-tender, non-distended Ext: no clubbing, cyanosis, or edema Psych: pleasant and cooperative  Skin: No evidence of breakdown, no evidence of rash Neuro: Alert and awake, follows commands, cranial nerves II through XII intact, normal speech and language Moving all 4 extremities to gravity Sensation intact light touch in all 4 extremities Musculoskeletal:  No joint swelling or tenderness noted Skin: PICC site  clean, no irritation LUE    Assessment/Plan: 1. Functional deficits which require 3+ hours per day of interdisciplinary therapy in a comprehensive inpatient rehab setting. Physiatrist is providing close team supervision and 24 hour management of active medical problems listed below. Physiatrist and rehab team continue to assess barriers to discharge/monitor patient progress toward functional and medical goals  Care Tool:  Bathing    Body parts bathed by patient: Right arm, Left arm, Chest, Abdomen, Front perineal area, Buttocks, Right upper leg, Left upper leg, Right lower leg, Left lower leg, Face         Bathing assist Assist Level: Minimal Assistance - Patient > 75%     Upper Body Dressing/Undressing Upper body dressing   What is  the patient wearing?: Pull over shirt    Upper body assist Assist Level: Set up assist    Lower Body Dressing/Undressing Lower body dressing      What is the patient wearing?: Underwear/pull up, Pants     Lower body assist Assist for lower body dressing: Minimal Assistance - Patient > 75%     Toileting Toileting    Toileting assist Assist for toileting: Contact Guard/Touching assist     Transfers Chair/bed transfer  Transfers assist     Chair/bed transfer assist level: Moderate Assistance - Patient 50 - 74%     Locomotion Ambulation   Ambulation assist      Assist level: Contact Guard/Touching assist Assistive device: Walker-rolling Max distance: 180   Walk 10 feet activity   Assist  Walk 10 feet activity did not occur: Safety/medical concerns  Assist level: Contact Guard/Touching assist Assistive device: Walker-rolling   Walk 50 feet activity   Assist Walk 50 feet with 2 turns activity did not occur: Safety/medical concerns  Assist level: Contact Guard/Touching assist Assistive device: Walker-rolling    Walk 150 feet activity   Assist Walk 150 feet activity did not occur: Safety/medical concerns  Assist level: Contact Guard/Touching assist Assistive device: Walker-rolling    Walk 10 feet on uneven surface  activity   Assist Walk 10 feet on uneven surfaces activity did not occur: Safety/medical concerns         Wheelchair     Assist Is the patient using a wheelchair?: Yes Type of Wheelchair: Manual    Wheelchair assist level: Supervision/Verbal cueing Max wheelchair distance: 150    Wheelchair 50 feet with 2 turns activity    Assist        Assist Level: Supervision/Verbal cueing   Wheelchair 150 feet activity     Assist      Assist Level: Supervision/Verbal cueing   Blood pressure (!) 132/99, pulse (!) 102, temperature 98.3 F (36.8 C), temperature source Oral, resp. rate 17, height 5\' 5"  (1.651 m),  weight 89.1 kg, SpO2 96 %.  Medical Problem List and Plan: 1. Functional deficits secondary to subdural/subarachnoid hemorrhage likely traumatic due to fall, she has history of orthostatic hypotension and BPPV with recurrent falls.             -patient may shower, cover PICC line             -ELOS/Goals: 7 to 10 days, supervision with PT and OT             -Continue CIR therapies including PT, OT    2.  Antithrombotics: -DVT/anticoagulation:  Pharmaceutical: Eliquis             -antiplatelet therapy:  3. Pain Management: N/A--tylenol prn available.  4. Mood/Behavior/Sleep:  LCSW to follow for evaluation and support.                       --Melatonin prn for insomnia.              -antipsychotic agents: N/A 5. Neuropsych/cognition: This patient is capable of making decisions on her own behalf. 6. Skin/Wound Care: Routine pressure relief measures.   - PICC area feeling better after PICC nurse adjusted dressing.  7. Fluids/Electrolytes/Nutrition: Monitor I/O.              --has had persistent issues with electrolyte abnormalities question intake/chemo related 8. BPPV: Vestibular eval and treat.  9. Recurrent hypokalemia: Will start daily supplement as was low as 2.8  and 2.9 twice since admission 10. Chronic low Mg: Mg-1.2-->1.7-->recheck in am             --continue Mg Ox. IV supplementation prn  -5/30 MG stable at 1.7  11. Metastatic non-small cell lung CA: Oxygen prn. Chemo on hold for now --pancytopenia improving. Plt improved from 84-->174. Hgb stable 8-9 range post transfusion.  --chronic hypoxic respiratory failure treated with prn oxygen -Continue outpatient follow-up with oncology 12. Malignant RML pleural effusion: Pleur X d/c 11/2022 (Dr. Irena Cords)             --CTA chest 03/05/23 w/stable small right effusion, stable RML central mass. Prior hx of lymphoma treated with chemo 2014 14. Anxiety: Ativan tid prn 15. chronic hypoxic respiratory failure             -She uses 2 L O2  nasal cannula as needed, currently on room air  6/1-2 stable on RA currently 16. CKD 3a.     -6/3 Cr 1.19      Latest Ref Rng & Units 03/27/2023    3:28 AM 03/26/2023    3:48 AM 03/22/2023    4:08 AM  BMP  Glucose 70 - 99 mg/dL  89  95   BUN 8 - 23 mg/dL  11  10   Creatinine 9.60 - 1.00 mg/dL  4.54  0.98   Sodium 119 - 145 mmol/L  140  140   Potassium 3.5 - 5.1 mmol/L 4.0  3.4  3.6   Chloride 98 - 111 mmol/L  107  107   CO2 22 - 32 mmol/L  27  26   Calcium 8.9 - 10.3 mg/dL  8.1  8.2   6/4 potassium 4.0 after supp  17. Obesity with BMI 33             -Dietary counseling 18.  History of DVT/PE.  Continue Eliquis 5 mg twice a day 19.  Sinus tachycardia.  Continue metoprolol.  Outpatient cardiology follow-up-likely multifactorial will enc fluids,   -6/4 persistent tachy in 100's, therapy noting, bp's up   -increase metoprolol to 50mg  tid       03/27/2023    5:16 AM 03/27/2023    5:00 AM 03/26/2023    7:59 PM  Vitals with BMI  Weight  196 lbs 7 oz   BMI  32.69   Systolic 132  102  Diastolic 99  92  Pulse 102  105    20.  Urinary retention.  Foley catheter removed 5/26, she reports she is voiding without issues, continue to monitor  -Remains continent, no new GU complaints 21.  Chronic Anemia   5/30 HGB stable at 9.0, PLT stable at 185 22.  Chronic systolic CHF.  Not on baseline diuretics.  Monitor daily weights.   -6/4 weights stable  -RLL likely scaring/atelectasis on CXR   -IS Filed Weights   03/25/23 0500 03/26/23 0402 03/27/23 0500  Weight: 91 kg 90.9 kg 89.1 kg  23. Itching at Picc line site  -improved as above  -Continue benadryl prn 24. Hx thoracic spine mets but not in epidural space, also severe degenerative lumbar stenosis and severe lumbar spondylosis - spine stable no back pain - legs are shaky  when standing, no focal weakness   LOS: 6 days A FACE TO FACE EVALUATION WAS PERFORMED  Ranelle Oyster 03/27/2023, 9:20 AM

## 2023-03-27 NOTE — Progress Notes (Signed)
Physical Therapy Session Note  Patient Details  Name: Crystal [name] MRN: [id number] Date of Birth: August 12, 1946  Today's Date: 03/27/2023 PT Individual Time: 1031-1100 PT Individual Time Calculation (min): 29 min   Short Term Goals: Week 1:  PT Short Term Goal 1 (Week 1): STG=LTG 2/2 ELOS  Skilled Therapeutic Interventions/Progress Updates:  Patient seated upright in w/c on entrance to room. Patient alert and agreeable to PT session.   Patient with no pain complaint at start of session.  Discusses upcoming d/c this week and potential d/c to ALF prior to returning home. Believes she will be at the ALF for one month prior to returning home.   Therapeutic Activity: Transfers: Pt performed sit<>stand and stand pivot transfers throughout session with supervision and use of RW. Without RW, pt's confidence wanes and demos difficulty with lift of feet without significant lateral trunk lean. Pt relates having tremors in BLE and lack of confidence with onset of tremors. Also,with fatigue, pt rushing to sit prior to completing turn to sit.   Gait Training/ NMR:  Pt ambulated >120 ft using RW with CGA. Demonstrated increased pace with forward lean over RW. On reaching mat table, pt fatigued with increase in pace to reach seat prior to full turn to seat. Educated pt on need for upright posture and reduced pressure in BUE in walker in order to better control RW as well as to reduce speed of ambulation. Ambulated 10' x1 without AD and without UE support in order to focus on upright posture. Significant slowing to pace and improved upright posture. Then provided with RW and vc to maintain upright posture and to control the RW  - not to let the RW control her. Improved safe pace with less pressure into BUE in next bout of 90' with 180* turn. Final bout back to room with increased pace upon approach of EOB and sitting to bed prior to completing turn with discard of RW to R side. Relates fatigue. Educated again re:  energy conservation and most accidents happening within movements rushed in order to reach seat.   NMR performed for improvements in motor control and coordination, balance, sequencing, judgement, and self confidence/ efficacy in performing all aspects of mobility at highest level of independence.   Patient supine in bed at end of session with brakes locked, bed alarm set, and all needs within reach.   Therapy Documentation Precautions:  Precautions Precautions: Fall Precaution Comments: monitor HR Restrictions Weight Bearing Restrictions: No Other Position/Activity Restrictions: dizziness with activity General:   Vital Signs:  Pain:  No pain related during session.   Therapy/Group: Individual Therapy  Loel Dubonnet PT, DPT, CSRS 03/27/2023, 10:25 AM

## 2023-03-27 NOTE — Progress Notes (Signed)
Patient ID: Crystal [name], female   DOB: 1946-07-23, 77 y.o.   MRN: [id number]  Spoke with niece-[name] and pt to discuss plan. Aundra Millet reports the ALF facility is asking for more money and they need to discuss if can manage this. Informed her pt is benefiting from being here and will find out after team conference target date if will help them out. Pt is aware. Will update both tomorrow after team conference.

## 2023-03-27 NOTE — Progress Notes (Signed)
Occupational Therapy Session Note  Patient Details  Name: Crystal [name] MRN: [id number] Date of Birth: October 23, 1946  Today's Date: 03/27/2023 OT Individual Time: 1300-1400 OT Individual Time Calculation (min): 60 min    Short Term Goals: Week 1:  OT Short Term Goal 1 (Week 1): STGs=LTGs based on LOS  Skilled Therapeutic Interventions/Progress Updates:    Pt received sitting supine and was receptive to OT session. No c/o pain.  Orthostatic vitals completed and listed within flow chart.  Stand pivot transfer from EOB> w/c with CGA and RW.  Ambulatory transfer to tub transfer bench in shower. PICC Line occluded. UB bathing with (S), LB bathing with min A using a long handled sponge while seated on shower bench.  UB dressing completed with (S), LB dressing with mod A for  threading clothing seated and pt able to pull clothing when standing.  Pt able to complete 138ft of functional mobility with CGA using RW  to dayroom to focus on increased dynamic balance and endurance.  Pt required extended rest break related to fatigue. Functional mobility back to room and requested to lay supine in bed.  Bed alarm in place, call light within reach and all needs met.   Therapy Documentation Precautions:  Precautions Precautions: Fall Precaution Comments: monitor HR Restrictions Weight Bearing Restrictions: No Other Position/Activity Restrictions: dizziness with activity      Therapy/Group: Individual Therapy  Liam Graham 03/27/2023, 2:18 PM

## 2023-03-27 NOTE — Progress Notes (Addendum)
Physical Therapy Session Note  Patient Details  Name: Crystal [name] MRN: [id number] Date of Birth: 1946/07/17  Today's Date: 03/27/2023 PT Individual Time: 0800-0856 PT Individual Time Calculation (min): 56 min   Short Term Goals: Week 1:  PT Short Term Goal 1 (Week 1): STG=LTG 2/2 ELOS  Skilled Therapeutic Interventions/Progress Updates:      Pt supine in bed upon arrival. Pt agreeable to therapy. Pt denies any pain. Pt does not demo any signs/symptoms of B LE tremors during session today. Seated rest breaks provided as needing for fatigue/SOB.    Therapist assessed bed mobility, pt performed rolling L and R and sit<>supine, supine to sit with sup/mod I, pt reports mild dizziness lasting less than a minute.   Pt performed stand pivot transfer with RW and supervision, pt demos improved carry over of safety with RW and turning, stand<>sit. Pt requesting to go outside. Pt propelled WC 150 feet with sup/mod I and increased time, verbal cues provided for energy conservation.Pt transported remainder distance to outside with total assist for time/energy conservation.    Pt ambulated 1x50, 1x90 feet with RW and supervision outdoors over uneven terrain, pt performed ambulatory transfers to outdoor bench and WC with carry over of safety with RW, with intermittent minimal instructional cues with fatigue.   Pt ambulated 1x50, 1x75 feet with no AD and HHA outdoors over uneven terrain, with verbal cues provided for increased step length, increased bilateral foot clearance.Pt continues to demo improved carryover of safety with ambulatory transfers to [facility] and outdoor bench with minimal instructional cue only even with fatigue.   Pt performed 1x10 reciprocal marching with RW and CGA, verbal and tactile cues provided for increased clearance.   Pt ambulated up sidewalk incline x70 feet outdoors with RW and CGA/supervision, verbal cues provided for safety with RW.   Pt transported dependent in [facility] to room. Pt  seated in WC at end of session with all needs within reach and seatbelt alarm on.     Therapy Documentation Precautions:  Precautions Precautions: Fall Precaution Comments: monitor HR Restrictions Weight Bearing Restrictions: No Other Position/Activity Restrictions: dizziness with activity Therapy/Group: Individual Therapy  [facility] [place], [place], DPT  03/27/2023, 7:25 AM

## 2023-03-27 NOTE — Progress Notes (Signed)
Physical Therapy Session Note  Patient Details  Name: Crystal [name] MRN: [id number] Date of Birth: Jan 24, 1946  Today's Date: 03/27/2023 PT Individual Time: 1430-1530 PT Individual Time Calculation (min): 60 min   Short Term Goals: Week 1:  PT Short Term Goal 1 (Week 1): STG=LTG 2/2 ELOS  Skilled Therapeutic Interventions/Progress Updates:  Patient greeted supine in bed and agreeable to PT treatment session. Patient transitioned from supine to sitting EOB with use of bed rail and Supv. Patient stood from EOB with RW and CGA- Patient then gait trained from her room to day room with RW and CGA. Patient with increased cadence throughout gait trial and required VC for staying within the frame of the RW in order to ensure safety.   Patient tasked with standing on airex foam pad with S UE support while reaching for clothespins on a tray table at her side and pinning them to the basketball net with CGA/MinA for stability- Patient completed x5 and x6 with a seated rest break in between. Patient with increased fear and anxiety with this activity requiring max VC for coaxing and encouragement. Patient then tasked with standing and removing all 11 clothespins with CGA/MinA- VC for pursed lip breathing in order to decrease overall anxiety.   Patient performed step-ups to airex foam pad with RW and CGA- Patient performed x5 with Rt LE leading and x5 with Lt LE leading. Patient required a seated rest break in between sets secondary to fatigue- VC for pursed lip breathing throughout activity as patient tends to hold her breath.   Patient weaved between x6 cones, x2 trials with RW and CGA for safety- Patient required VC for stepping within the frame of the RW, decreased cadence and breathing throughout activity. Patient required extended seated rest break in between each set secondary to fatigue and SOB.   Patient performed x10 sit/stands without UE support and CGA for safety- VC for not pushing the back of B LE  into the surface for stability with good improvements noted.   Patient gait trained back to her room with RW and CGA- Same VC as above. Patient required a seated rest break on the EOB prior to ambulating to the bathroom. Patient with continent void and was able to perform pericare and manage pants/brief with CGA for safety. VC for decreased cadence and breathing throughout as patient tends to rush through activities and hold her breath. Patient transitioned from sitting EOB to supine with supv. Patient left supine in bed with bed alarm on, call bell within reach, all needs met and family present.    Therapy Documentation Precautions:  Precautions Precautions: Fall Precaution Comments: monitor HR Restrictions Weight Bearing Restrictions: No Other Position/Activity Restrictions: dizziness with activity  Pain: No/Denies pain.    Therapy/Group: Individual Therapy  Kalel Harty 03/27/2023, 7:52 AM

## 2023-03-28 ENCOUNTER — Telehealth: Payer: Self-pay | Admitting: *Deleted

## 2023-03-28 DIAGNOSIS — J9811 Atelectasis: Secondary | ICD-10-CM | POA: Diagnosis not present

## 2023-03-28 DIAGNOSIS — I629 Nontraumatic intracranial hemorrhage, unspecified: Secondary | ICD-10-CM | POA: Diagnosis not present

## 2023-03-28 DIAGNOSIS — N1831 Chronic kidney disease, stage 3a: Secondary | ICD-10-CM | POA: Diagnosis not present

## 2023-03-28 DIAGNOSIS — C3490 Malignant neoplasm of unspecified part of unspecified bronchus or lung: Secondary | ICD-10-CM | POA: Diagnosis not present

## 2023-03-28 NOTE — Progress Notes (Signed)
Occupational Therapy Session Note  Patient Details  Name: Crystal [name] MRN: [id number] Date of Birth: 1946-10-17  Today's Date: 03/28/2023 OT Individual Time: 1110-1200 OT Individual Time Calculation (min): 50 min    Short Term Goals: Week 1:  OT Short Term Goal 1 (Week 1): STGs=LTGs based on LOS   Skilled Therapeutic Interventions/Progress Updates:  Pt received sitting in w/c both PA and niece present discussing d/c to AL facility.  Pt receptive to OT session.  No c/o pain reported at beginning of session.  Functional mobility 157ft to gym with CGA and RW.   Pt able to complete visual scanning activity using the BITS with a focus on challenging functional reach, dynamic standing balance and endurance.  Pt required many extended rest breaks d/t fatigue.  Cueing provided related to base of support and feet placement. Education provided on  the importance of rest breaks in the home environment when completing tasks.  Pt required continued cueing for reduced UE support on the RW to grade dynamic balance demands.  Pt did verbalize that she felt like she couldn't do it without support but was able to stand at times up to 1 min 30 secs with minimal support from RW, for up to 4 repetitions. Functional mobility back to room using RW, 43ft with CGA.  Pt in w/c, alarm in place, call light within reach and needs met.  10 minutes missed d/t discussion at beginning of session.  Therapy Documentation Precautions:  Precautions Precautions: Fall Precaution Comments: monitor HR Restrictions Weight Bearing Restrictions: No Other Position/Activity Restrictions: dizziness with activity      Therapy/Group: Individual Therapy  Liam Graham 03/28/2023, 12:19 PM

## 2023-03-28 NOTE — Telephone Encounter (Signed)
Crystal [name] called to report she is being d/c from rehab to ALF, [facility] on 6/7. Asking about getting appointment for treatment week of 6/10.

## 2023-03-28 NOTE — [place] FL2 (Signed)
[place] MEDICAID FL2 LEVEL OF CARE FORM     IDENTIFICATION  Patient Name: Crystal [name] Birthdate: January 03, 1946 Sex: female Admission Date (Current Location): 03/21/2023  [facility] and [place] Number:  Producer, television/film/video and Address:  The . [facility], [address] [place], [place] [address]      Provider Number: [id number]  Attending Physician Name and Address:  Fanny Dance, MD  Relative Name and Phone Number:  Aundra Millet Roberts-niece-POA    Current Level of Care: Other (Comment) (Rehab) Recommended Level of Care: Assisted Living Facility Prior Approval Number:    Date Approved/Denied:   PASRR Number:    Discharge Plan: Other (Comment) (ALF)    Current Diagnoses: Patient Active Problem List   Diagnosis Date Noted   Debility 03/21/2023   Postural dizziness with presyncope 03/07/2023   Frequent PVCs 03/07/2023   Thrombocytopenia (HCC) 03/07/2023   Anemia associated with chemotherapy 03/07/2023   UTI (urinary tract infection) 03/07/2023   Sinus tachycardia 03/06/2023   History of DVT (deep vein thrombosis) 03/06/2023   DNR (do not resuscitate)/DNI(Do Not Intubate) 03/06/2023   Pleural effusion, malignant 01/13/2023   Neutropenia (HCC) 12/08/2022   Recurrent right pleural effusion 11/16/2022   Acute pulmonary embolism (HCC) 11/12/2022   Acute hypoxemic respiratory failure (HCC) 11/12/2022   Metastatic non-small cell lung cancer (HCC) 11/12/2022   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   Rib fracture 11/12/2022   Anxiety 11/12/2022   CKD stage 3a, GFR 45-59 ml/min (HCC) - baseline SCr 0.9-1.1 11/12/2022   Chronic HFrEF (heart failure with reduced ejection fraction) (HCC) 11/12/2022   Metastasis to spinal column (HCC) 10/03/2022   Primary cancer of right middle lobe of lung (HCC) 09/29/2022   Intracranial bleed (HCC) 09/13/2022   Lung mass 09/13/2022   Hypokalemia 09/13/2022   Tachycardia 08/29/2021   Preop cardiovascular exam 08/29/2020    Symptomatic cholelithiasis 06/09/2020   S/P laparoscopic cholecystectomy 06/09/2020   Chest pain with moderate risk for cardiac etiology 02/14/2018   Right upper quadrant pain 02/12/2018   Tremor 02/11/2014   Non Hodgkin's lymphoma (HCC) 08/18/2013   Neck pain on left side 05/30/2013   Dizziness 05/30/2013   Moderate obesity 05/21/2013   Lung crackles 05/21/2013   Dyspnea on exertion 05/21/2013   Nonischemic cardiomyopathy (HCC)    Essential hypertension    Dyslipidemia, goal LDL below 100     Orientation RESPIRATION BLADDER Height & Weight     Self, Time, Situation, Place  O2 (2 liters PRN) Continent Weight: 196 lb 6.9 oz (89.1 kg) Height:  5\' 5"  (165.1 cm)  BEHAVIORAL SYMPTOMS/MOOD NEUROLOGICAL BOWEL NUTRITION STATUS      Continent Diet (regular diet thin liquids)  AMBULATORY STATUS COMMUNICATION OF NEEDS Skin   Supervision Verbally PU Stage and Appropriate Care   PU Stage 2 Dressing: No Dressing (Healing stage 2 on sacrum open to air no dressing)                   Personal Care Assistance Level of Assistance  Bathing, Dressing Bathing Assistance: Limited assistance Feeding assistance: Independent Dressing Assistance: Limited assistance     Functional Limitations Info             SPECIAL CARE FACTORS FREQUENCY  PT (By licensed PT), OT (By licensed OT)     PT Frequency: 3x week OT Frequency: 3x week            Contractures Contractures Info: Not present    Additional Factors Info  Code  Status, Allergies Code Status Info: DNR Allergies Info: Simvastin, Zoloft ( Sertraline), Codeine, Coreg, Lisinopril, Tizanidine HCL           Current Medications (03/28/2023):  This is the current hospital active medication list Current Facility-Administered Medications  Medication Dose Route Frequency Provider Last Rate Last Admin   acetaminophen (TYLENOL) tablet 325-650 mg  325-650 mg Oral Q4H PRN Love, Pamela S, PA-C       alum & mag hydroxide-simeth  (MAALOX/MYLANTA) 200-200-20 MG/5ML suspension 30 mL  30 mL Oral Q4H PRN Love, Pamela S, PA-C       apixaban (ELIQUIS) tablet 5 mg  5 mg Oral BID Love, Pamela S, PA-C   5 mg at 03/28/23 1610   ascorbic acid (VITAMIN C) tablet 500 mg  500 mg Oral Daily Fanny Dance, MD   500 mg at 03/28/23 0902   bisacodyl (DULCOLAX) suppository 10 mg  10 mg Rectal Daily PRN Love, Pamela S, PA-C       Chlorhexidine Gluconate Cloth 2 % PADS 6 each  6 each Topical Q12H Fanny Dance, MD   6 each at 03/28/23 0528   diphenhydrAMINE (BENADRYL) capsule 25 mg  25 mg Oral Q6H PRN Love, Pamela S, PA-C       ferrous sulfate tablet 325 mg  325 mg Oral Q lunch Jacquelynn Cree, PA-C   325 mg at 03/27/23 1531   folic acid (FOLVITE) tablet 1 mg  1 mg Oral Daily Love, Pamela S, PA-C   1 mg at 03/28/23 0902   guaiFENesin-dextromethorphan (ROBITUSSIN DM) 100-10 MG/5ML syrup 5-10 mL  5-10 mL Oral Q6H PRN Love, Pamela S, PA-C       lidocaine (XYLOCAINE) 2 % jelly   Topical PRN Love, Pamela S, PA-C       LORazepam (ATIVAN) tablet 0.5 mg  0.5 mg Oral BID PRN Love, Pamela S, PA-C   0.5 mg at 03/27/23 2141   magnesium oxide (MAG-OX) tablet 400 mg  400 mg Oral BID Jacquelynn Cree, PA-C   400 mg at 03/28/23 9604   meclizine (ANTIVERT) tablet 25 mg  25 mg Oral TID PRN Jacquelynn Cree, PA-C       melatonin tablet 3 mg  3 mg Oral QHS PRN Love, Pamela S, PA-C       metoprolol tartrate (LOPRESSOR) tablet 50 mg  50 mg Oral TID Ranelle Oyster, MD   50 mg at 03/28/23 5409   nutrition supplement (JUVEN) (JUVEN) powder packet 1 packet  1 packet Oral BID BM Fanny Dance, MD   1 packet at 03/28/23 8119   oxyCODONE (Oxy IR/ROXICODONE) immediate release tablet 5 mg  5 mg Oral Q6H PRN Love, Pamela S, PA-C       pantoprazole (PROTONIX) EC tablet 40 mg  40 mg Oral BID Love, Pamela S, PA-C   40 mg at 03/27/23 2140   potassium chloride SA (KLOR-CON M) CR tablet 20 mEq  20 mEq Oral Daily Jacquelynn Cree, PA-C   20 mEq at 03/28/23 1478    prochlorperazine (COMPAZINE) tablet 5-10 mg  5-10 mg Oral Q6H PRN Delle Reining S, PA-C       Or   prochlorperazine (COMPAZINE) suppository 12.5 mg  12.5 mg Rectal Q6H PRN Love, Pamela S, PA-C       Or   prochlorperazine (COMPAZINE) injection 5-10 mg  5-10 mg Intramuscular Q6H PRN Love, Pamela S, PA-C       sodium chloride flush (NS) 0.9 % injection 10-40 mL  10-40 mL Intracatheter Q12H Jacquelynn Cree, PA-C   10 mL at 03/28/23 1610   sodium chloride flush (NS) 0.9 % injection 10-40 mL  10-40 mL Intracatheter PRN Love, Pamela S, PA-C       sodium phosphate (FLEET) 7-19 GM/118ML enema 1 enema  1 enema Rectal Once PRN Love, Pamela S, PA-C       tuberculin injection 5 Units  5 Units Intradermal Once Jacquelynn Cree, PA-C   5 Units at 03/26/23 1631   zinc sulfate capsule 220 mg  220 mg Oral Daily Fanny Dance, MD   220 mg at 03/28/23 9604     Discharge Medications: Please see discharge summary for a list of discharge medications.  Relevant Imaging Results:  Relevant Lab Results:   Additional Information Pt also has a PICC line she will discharge with  Dardan Shelton, Lemar Livings, LCSW

## 2023-03-28 NOTE — [place] FL2 (Signed)
[place] MEDICAID FL2 LEVEL OF CARE FORM     IDENTIFICATION  Patient Name: Crystal [name] Birthdate: 03/26/1946 Sex: female Admission Date (Current Location): 03/21/2023  County and Medicaid Number:  [place]   Facility and Address:  The [facility], [address], [place], [place] [address]      Provider Number: [id number]  Attending Physician Name and Address:  Shtridelman, Yuri, MD  Relative Name and Phone Number:  [name] Roberts-niece-POA    Current Level of Care: Other (Comment) (Rehab) Recommended Level of Care: Assisted Living Facility Prior Approval Number:    Date Approved/Denied:   PASRR Number:    Discharge Plan: Other (Comment) (ALF)    Current Diagnoses: Patient Active Problem List   Diagnosis Date Noted   Debility 03/21/2023   Postural dizziness with presyncope 03/07/2023   Frequent PVCs 03/07/2023   Thrombocytopenia (HCC) 03/07/2023   Anemia associated with chemotherapy 03/07/2023   UTI (urinary tract infection) 03/07/2023   Sinus tachycardia 03/06/2023   History of DVT (deep vein thrombosis) 03/06/2023   DNR (do not resuscitate)/DNI(Do Not Intubate) 03/06/2023   Pleural effusion, malignant 01/13/2023   Neutropenia (HCC) 12/08/2022   Recurrent right pleural effusion 11/16/2022   Acute pulmonary embolism (HCC) 11/12/2022   Acute hypoxemic respiratory failure (HCC) 11/12/2022   Metastatic non-small cell lung cancer (HCC) 11/12/2022   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   Rib fracture 11/12/2022   Anxiety 11/12/2022   CKD stage 3a, GFR 45-59 ml/min (HCC) - baseline SCr 0.9-1.1 11/12/2022   Chronic HFrEF (heart failure with reduced ejection fraction) (HCC) 11/12/2022   Metastasis to spinal column (HCC) 10/03/2022   Primary cancer of right middle lobe of lung (HCC) 09/29/2022   Intracranial bleed (HCC) 09/13/2022   Lung mass 09/13/2022   Hypokalemia 09/13/2022   Tachycardia 08/29/2021   Preop cardiovascular exam 08/29/2020    Symptomatic cholelithiasis 06/09/2020   S/P laparoscopic cholecystectomy 06/09/2020   Chest pain with moderate risk for cardiac etiology 02/14/2018   Right upper quadrant pain 02/12/2018   Tremor 02/11/2014   Non Hodgkin's lymphoma (HCC) 08/18/2013   Neck pain on left side 05/30/2013   Dizziness 05/30/2013   Moderate obesity 05/21/2013   Lung crackles 05/21/2013   Dyspnea on exertion 05/21/2013   Nonischemic cardiomyopathy (HCC)    Essential hypertension    Dyslipidemia, goal LDL below 100     Orientation RESPIRATION BLADDER Height & Weight     Self, Time, Situation, Place  O2 (2 liters PRN) Continent Weight: 196 lb 6.9 oz (89.1 kg) Height:  5' 5" (165.1 cm)  BEHAVIORAL SYMPTOMS/MOOD NEUROLOGICAL BOWEL NUTRITION STATUS      Continent Diet (regular diet thin liquids)  AMBULATORY STATUS COMMUNICATION OF NEEDS Skin   Supervision Verbally PU Stage and Appropriate Care   PU Stage 2 Dressing: No Dressing (Healing stage 2 on sacrum open to air no dressing)                   Personal Care Assistance Level of Assistance  Bathing, Dressing Bathing Assistance: Limited assistance Feeding assistance: Independent Dressing Assistance: Limited assistance     Functional Limitations Info             SPECIAL CARE FACTORS FREQUENCY  PT (By licensed PT), OT (By licensed OT)     PT Frequency: 3x week OT Frequency: 3x week            Contractures Contractures Info: Not present    Additional Factors Info  Code   Status, Allergies Code Status Info: DNR Allergies Info: Simvastin, Zoloft ( Sertraline), Codeine, Coreg, Lisinopril, Tizanidine HCL           Current Medications (03/28/2023):  This is the current hospital active medication list Current Facility-Administered Medications  Medication Dose Route Frequency Provider Last Rate Last Admin   acetaminophen (TYLENOL) tablet 325-650 mg  325-650 mg Oral Q4H PRN Love, Pamela S, PA-C       alum & mag hydroxide-simeth  (MAALOX/MYLANTA) 200-200-20 MG/5ML suspension 30 mL  30 mL Oral Q4H PRN Love, Pamela S, PA-C       apixaban (ELIQUIS) tablet 5 mg  5 mg Oral BID Love, Pamela S, PA-C   5 mg at 03/28/23 0902   ascorbic acid (VITAMIN C) tablet 500 mg  500 mg Oral Daily Shtridelman, Yuri, MD   500 mg at 03/28/23 0902   bisacodyl (DULCOLAX) suppository 10 mg  10 mg Rectal Daily PRN Love, Pamela S, PA-C       Chlorhexidine Gluconate Cloth 2 % PADS 6 each  6 each Topical Q12H Shtridelman, Yuri, MD   6 each at 03/28/23 0528   diphenhydrAMINE (BENADRYL) capsule 25 mg  25 mg Oral Q6H PRN Love, Pamela S, PA-C       ferrous sulfate tablet 325 mg  325 mg Oral Q lunch Love, Pamela S, PA-C   325 mg at 03/27/23 1531   folic acid (FOLVITE) tablet 1 mg  1 mg Oral Daily Love, Pamela S, PA-C   1 mg at 03/28/23 0902   guaiFENesin-dextromethorphan (ROBITUSSIN DM) 100-10 MG/5ML syrup 5-10 mL  5-10 mL Oral Q6H PRN Love, Pamela S, PA-C       lidocaine (XYLOCAINE) 2 % jelly   Topical PRN Love, Pamela S, PA-C       LORazepam (ATIVAN) tablet 0.5 mg  0.5 mg Oral BID PRN Love, Pamela S, PA-C   0.5 mg at 03/27/23 2141   magnesium oxide (MAG-OX) tablet 400 mg  400 mg Oral BID Love, Pamela S, PA-C   400 mg at 03/28/23 0903   meclizine (ANTIVERT) tablet 25 mg  25 mg Oral TID PRN Love, Pamela S, PA-C       melatonin tablet 3 mg  3 mg Oral QHS PRN Love, Pamela S, PA-C       metoprolol tartrate (LOPRESSOR) tablet 50 mg  50 mg Oral TID Swartz, Zachary T, MD   50 mg at 03/28/23 0902   nutrition supplement (JUVEN) (JUVEN) powder packet 1 packet  1 packet Oral BID BM Shtridelman, Yuri, MD   1 packet at 03/28/23 0903   oxyCODONE (Oxy IR/ROXICODONE) immediate release tablet 5 mg  5 mg Oral Q6H PRN Love, Pamela S, PA-C       pantoprazole (PROTONIX) EC tablet 40 mg  40 mg Oral BID Love, Pamela S, PA-C   40 mg at 03/27/23 2140   potassium chloride SA (KLOR-CON M) CR tablet 20 mEq  20 mEq Oral Daily Love, Pamela S, PA-C   20 mEq at 03/28/23 0903    prochlorperazine (COMPAZINE) tablet 5-10 mg  5-10 mg Oral Q6H PRN Love, Pamela S, PA-C       Or   prochlorperazine (COMPAZINE) suppository 12.5 mg  12.5 mg Rectal Q6H PRN Love, Pamela S, PA-C       Or   prochlorperazine (COMPAZINE) injection 5-10 mg  5-10 mg Intramuscular Q6H PRN Love, Pamela S, PA-C       sodium chloride flush (NS) 0.9 % injection 10-40 mL    10-40 mL Intracatheter Q12H Love, Pamela S, PA-C   10 mL at 03/28/23 0903   sodium chloride flush (NS) 0.9 % injection 10-40 mL  10-40 mL Intracatheter PRN Love, Pamela S, PA-C       sodium phosphate (FLEET) 7-19 GM/118ML enema 1 enema  1 enema Rectal Once PRN Love, Pamela S, PA-C       tuberculin injection 5 Units  5 Units Intradermal Once Love, Pamela S, PA-C   5 Units at 03/26/23 1631   zinc sulfate capsule 220 mg  220 mg Oral Daily Shtridelman, Yuri, MD   220 mg at 03/28/23 0903     Discharge Medications: Please see discharge summary for a list of discharge medications.  Relevant Imaging Results:  Relevant Lab Results:   Additional Information Pt also has a PICC line she will discharge with  , Ardice G, LCSW     

## 2023-03-28 NOTE — Progress Notes (Signed)
Physical Therapy Session Note  Patient Details  Name: Crystal [name] MRN: [id number] Date of Birth: May 24, 1946  Today's Date: 03/28/2023 PT Individual Time: 1353-1448 PT Individual Time Calculation (min): 55 min    Short Term Goals: Week 1:  PT Short Term Goal 1 (Week 1): STG=LTG 2/2 ELOS  Skilled Therapeutic Interventions/Progress Updates:  Patient received resting in [facility] and agreeable to therapy. Pt dependently transported to main gym for time management and energy conservation.  Pt performed gait for endurance training and progression of DME. 1x~100' completed with RW, CGA for safety and cues for proximity/position to walker as pt tends to allow walker to drift too far anteriorly. Rollator used for 2nd bout of gait, pt amb ~100' and min assist required to stabilize rollator as pt had difficulty controlling movement. At end of gait bout pt had increased WOB and began rushing to sit. Max cues needed for safety and Min assist to steady balance and guide turn to sit on safely positioned Rollator against wall and locked. Pt required extended seated rest for recovery. Pt ambulated back to main gym with Rollator, ~100' and again had increased WOB and required cues to safely guide path back to [facility] and steady balance with turn to sit. Discussed concerns with use of rollator at this time and pt understanding but reports enjoyed using rollator and feels she will benefit from the seat for breaks as needed. Educated pt that is correct but she will need improved balance and will need to self grade fatigue to use rollator for rest before getting so fatigued she tries to sit in a rushed/panicked state; pt verbalized her understanding.   Following longer rest break pt participated in dynamic gait challenge with RW for AD. Patient ambulated 2 bouts weaving through 8 cones (set 3' apart) for dynamic gait/balance activity. Pt required min assist and fatigued following each bout requiring assist to guide pivot to turn  and sit in chair.  Pt dependently rolled in WC into // bars for functional step ups. Pt completed 2x10 forward step ups with bil LE's in // bars with bil UE support and 4" step. Required extended rest break between each set on each LE due to fatigue noted by increased WOB. Pt dependently transported back to room for time constraints and due to fatigue. EOS pt agreeable to remain up in [facility], Alarm on and call bell within reach, and all needs met.  Therapy Documentation Precautions:  Precautions Precautions: Fall Precaution Comments: monitor HR Restrictions Weight Bearing Restrictions: No Other Position/Activity Restrictions: dizziness with activity  Pain: Pain Assessment Pain Scale: 0-10 Pain Score: 0-No pain  Therapy/Group: Individual Therapy   Wynn Maudlin, DPT Acute Rehabilitation Services Office [phone]  03/28/23 6:17 PM

## 2023-03-28 NOTE — Progress Notes (Shared)
Occupational Therapy Discharge Summary  Patient Details  Name: Crystal [name] MRN: [id number] Date of Birth: December 23, 1945 OT Time of Service: 1100-1155, 55 min  Date of Discharge from OT service:March 29, 2023   Patient has met 10 of 11 long term goals due to improved activity tolerance, improved balance, postural control, ability to compensate for deficits, and improved coordination.  Patient to discharge at overall Supervision level.  Patient's care partner unavailable to provide the necessary physical assistance at discharge therefore pt wil d/c to a AL facility. [name] has made great progress in CIR with great participation in therapy and has had an improvement in standing balance, and endurance that has improved her safety and independence in ADLs.    Reasons goals not met: Pt requires supervision with LB dressing.  Recommendation:  Patient will benefit from ongoing skilled OT services in home health setting to continue to advance functional skills in the area of BADL and Reduce care partner burden.  Equipment: No equipment provided  Reasons for discharge: treatment goals met and discharge from hospital  Patient/family agrees with progress made and goals achieved: Yes  OT Discharge Precautions/Restrictions  Precautions Precautions: Fall Precaution Comments: monitor HR Restrictions Weight Bearing Restrictions: No   ADL ADL Equipment Provided: Long-handled shoe horn Eating: Independent Where Assessed-Eating: Chair Grooming: Supervision/safety Where Assessed-Grooming: Sitting at sink Upper Body Bathing: Supervision/safety Where Assessed-Upper Body Bathing: Shower Lower Body Bathing: Supervision/safety Where Assessed-Lower Body Bathing: Shower Upper Body Dressing: Supervision/safety Where Assessed-Upper Body Dressing: Wheelchair Lower Body Dressing: Supervision/safety Where Assessed-Lower Body Dressing: Wheelchair Toileting: Supervision/safety Where Assessed-Toileting:  Teacher, adult education: Close supervision Toilet Transfer Method: Surveyor, minerals: Engineer, technical sales: Close supervison Web designer Method: Engineer, technical sales: Insurance underwriter: Close supervision Film/video editor Method: Warden/ranger: Emergency planning/management officer ADL Comments: set up for grooming and UB self care sink side seated, min A overall simple bathing and dressing sink side, mod A amb with RW for TTB transfer in demo apt space, min a stall shower Vision Baseline Vision/History: 1 Wears glasses Patient Visual Report: No change from baseline Vision Assessment?: No apparent visual deficits Perception  Perception: Within Functional Limits Praxis Praxis: Intact Cognition Cognition Overall Cognitive Status: Within Functional Limits for tasks assessed Arousal/Alertness: Awake/alert Orientation Level: Person;Place;Situation Person: Oriented Place: Oriented Situation: Oriented Memory: Appears intact Awareness: Appears intact Problem Solving: Appears intact Safety/Judgment: Appears intact Brief Interview for Mental Status (BIMS) Repetition of Three Words (First Attempt): 3 Temporal Orientation: Year: Correct Temporal Orientation: Month: Accurate within 5 days Temporal Orientation: Day: Correct Recall: "Sock": No, could not recall Recall: "Blue": Yes, no cue required Recall: "Bed": Yes, no cue required BIMS Summary Score: 13 Sensation Sensation Light Touch: Appears Intact Hot/Cold: Appears Intact Proprioception: Appears Intact Coordination Gross Motor Movements are Fluid and Coordinated: No Fine Motor Movements are Fluid and Coordinated: Yes Coordination and Movement Description: generalized weakness Motor  Motor Motor: Other (comment) Motor - Discharge Observations: generalized weakness Mobility  Bed Mobility Rolling Right: Independent with assistive  device Rolling Left: Independent with assistive device Supine to Sit: Independent with assistive device Sit to Supine: Independent with assistive device Transfers Sit to Stand: Supervision/Verbal cueing  Trunk/Postural Assessment  Cervical Assessment Cervical Assessment: Within Functional Limits Thoracic Assessment Thoracic Assessment: Within Functional Limits Lumbar Assessment Lumbar Assessment: Within Functional Limits Postural Control Postural Control: Deficits on evaluation Righting Reactions: delayed righting reactions  Balance Balance Balance Assessed: Yes Static Sitting Balance Static Sitting -  Balance Support: Feet supported Static Sitting - Level of Assistance: 7: Independent Dynamic Sitting Balance Dynamic Sitting - Balance Support: Feet supported Dynamic Sitting - Level of Assistance: 7: Independent Static Standing Balance Static Standing - Balance Support: Bilateral upper extremity supported Static Standing - Level of Assistance: 6: Modified independent (Device/Increase time) Dynamic Standing Balance Dynamic Standing - Balance Support: Bilateral upper extremity supported Dynamic Standing - Level of Assistance: 5: Stand by assistance Extremity/Trunk Assessment RUE Assessment RUE Assessment: Exceptions to [facility] General Strength Comments: 4-/5 LUE Assessment LUE Assessment: Exceptions to [facility] General Strength Comments: 4-/5  OT Treatment and Intervention:   Pt seen for final OT session this am. Pt has made significant gains since IE as evaluating clinician completing final session for d/c. OT rec'd pt bed level and reports she will d/c to ALF tomorrow. Pt requesting full shower this session. OT educated in energy conservation, task simplification and addressed higher level balance for ADL item set up amb level. Pt continues to require rest breaks with amb and standing skills with HR between 88-102 bpm with return to baseline with short rest and min cues for breathing  integration with teach back indep. Pt usign TTB and grab bars in shower which mirrors new set up in ALF. Use of RW at all times recommended. No further OT needs with d/c complete at this time.    Liam Graham 03/28/2023, 1:24 PM

## 2023-03-28 NOTE — Progress Notes (Signed)
PROGRESS NOTE   Subjective/Complaints:  Pt up in gym. Feeling well. Denies any cough or problems today. Sleeping well.    Review of Systems  Constitutional:  Negative for chills and fever.  HENT:  Negative for congestion.   Eyes:  Negative for double vision.  Respiratory:  Negative for cough and shortness of breath.   Cardiovascular:  Negative for chest pain.  Gastrointestinal:  Negative for abdominal pain, diarrhea, nausea and vomiting.  Genitourinary: Negative.   Musculoskeletal:  Negative for back pain and neck pain.  Skin:  Negative for itching.  Neurological:  Negative for sensory change, weakness and headaches.     Objective:   DG Chest 2 View  Result Date: 03/26/2023 CLINICAL DATA:  Follow-up exam. EXAM: CHEST - 2 VIEW COMPARISON:  03/06/2023. FINDINGS: Unchanged left upper extremity PICC with tip projecting over the superior cavoatrial junction. Unchanged bandlike opacities in the right lower lung, favored to reflect atelectasis and/or scarring. No consolidation or pulmonary edema. Stable cardiac and mediastinal contours. No pleural effusion or pneumothorax. IMPRESSION: Unchanged bandlike opacities in the right lower lung, favored to reflect atelectasis and/or scarring. Electronically Signed   By: Orvan Falconer M.D.   On: 03/26/2023 16:16   Recent Labs    03/26/23 0348  WBC 4.1  HGB 8.6*  HCT 28.3*  PLT 186    Recent Labs    03/26/23 0348 03/27/23 0328  NA 140  --   K 3.4* 4.0  CL 107  --   CO2 27  --   GLUCOSE 89  --   BUN 11  --   CREATININE 1.19*  --   CALCIUM 8.1*  --      Intake/Output Summary (Last 24 hours) at 03/28/2023 0901 Last data filed at 03/28/2023 0730 Gross per 24 hour  Intake 478 ml  Output --  Net 478 ml      Pressure Injury 03/06/23 Buttocks Right;Medial Stage 2 -  Partial thickness loss of dermis presenting as a shallow open injury with a red, pink wound bed without slough.  (Active)  03/06/23 0050  Location: Buttocks  Location Orientation: Right;Medial  Staging: Stage 2 -  Partial thickness loss of dermis presenting as a shallow open injury with a red, pink wound bed without slough.  Wound Description (Comments):   Present on Admission: Yes    Physical Exam: Vital Signs Blood pressure 112/80, pulse (!) 102, temperature 98.4 F (36.9 C), temperature source Oral, resp. rate 16, height 5\' 5"  (1.651 m), weight 89.1 kg, SpO2 100 %.   Constitutional: No distress . Vital signs reviewed. HEENT: NCAT, EOMI, oral membranes moist Neck: supple Cardiovascular: RRR without murmur. No JVD    Respiratory/Chest: CTA Bilaterally without wheezes or rales. Normal effort    GI/Abdomen: BS +, non-tender, non-distended Ext: no clubbing, cyanosis, or edema Psych: pleasant and cooperative  Skin: No evidence of breakdown, no evidence of rash Neuro: Alert and awake, follows commands, cranial nerves II through XII intact, normal speech and language Moving all 4 extremities to gravity Sensation intact light touch in all 4 extremities Musculoskeletal:  No joint swelling or tenderness noted Skin: PICC site clean,     Assessment/Plan:  1. Functional deficits which require 3+ hours per day of interdisciplinary therapy in a comprehensive inpatient rehab setting. Physiatrist is providing close team supervision and 24 hour management of active medical problems listed below. Physiatrist and rehab team continue to assess barriers to discharge/monitor patient progress toward functional and medical goals  Care Tool:  Bathing    Body parts bathed by patient: Right arm, Left arm, Chest, Abdomen, Front perineal area, Buttocks, Right upper leg, Left upper leg, Right lower leg, Left lower leg, Face         Bathing assist Assist Level: Minimal Assistance - Patient > 75%     Upper Body Dressing/Undressing Upper body dressing   What is the patient wearing?: Pull over shirt    Upper  body assist Assist Level: Set up assist    Lower Body Dressing/Undressing Lower body dressing      What is the patient wearing?: Underwear/pull up, Pants     Lower body assist Assist for lower body dressing: Minimal Assistance - Patient > 75%     Toileting Toileting    Toileting assist Assist for toileting: Contact Guard/Touching assist     Transfers Chair/bed transfer  Transfers assist     Chair/bed transfer assist level: Moderate Assistance - Patient 50 - 74%     Locomotion Ambulation   Ambulation assist      Assist level: Contact Guard/Touching assist Assistive device: Walker-rolling Max distance: 180   Walk 10 feet activity   Assist  Walk 10 feet activity did not occur: Safety/medical concerns  Assist level: Contact Guard/Touching assist Assistive device: Walker-rolling   Walk 50 feet activity   Assist Walk 50 feet with 2 turns activity did not occur: Safety/medical concerns  Assist level: Contact Guard/Touching assist Assistive device: Walker-rolling    Walk 150 feet activity   Assist Walk 150 feet activity did not occur: Safety/medical concerns  Assist level: Contact Guard/Touching assist Assistive device: Walker-rolling    Walk 10 feet on uneven surface  activity   Assist Walk 10 feet on uneven surfaces activity did not occur: Safety/medical concerns         Wheelchair     Assist Is the patient using a wheelchair?: Yes Type of Wheelchair: Manual    Wheelchair assist level: Supervision/Verbal cueing Max wheelchair distance: 150    Wheelchair 50 feet with 2 turns activity    Assist        Assist Level: Supervision/Verbal cueing   Wheelchair 150 feet activity     Assist      Assist Level: Supervision/Verbal cueing   Blood pressure 112/80, pulse (!) 102, temperature 98.4 F (36.9 C), temperature source Oral, resp. rate 16, height 5\' 5"  (1.651 m), weight 89.1 kg, SpO2 100 %.  Medical Problem List and  Plan: 1. Functional deficits secondary to subdural/subarachnoid hemorrhage likely traumatic due to fall, she has history of orthostatic hypotension and BPPV with recurrent falls.             -patient may shower, cover PICC line             -ELOS/Goals: 7 to 10 days, supervision with PT and OT             -Continue CIR therapies including PT and OT. Interdisciplinary team conference today to discuss goals, barriers to discharge, and dc planning.    2.  Antithrombotics: -DVT/anticoagulation:  Pharmaceutical: Eliquis             -antiplatelet therapy:  3. Pain Management: N/A--tylenol  prn available.  4. Mood/Behavior/Sleep: LCSW to follow for evaluation and support.                       --Melatonin prn for insomnia.              -antipsychotic agents: N/A 5. Neuropsych/cognition: This patient is capable of making decisions on her own behalf. 6. Skin/Wound Care: Routine pressure relief measures.   - PICC area feeling better after PICC nurse adjusted dressing.  7. Fluids/Electrolytes/Nutrition: Monitor I/O.              --has had persistent issues with electrolyte abnormalities question intake/chemo related 8. BPPV: Vestibular eval and treat.  9. Recurrent hypokalemia: Will start daily supplement as was low as 2.8  and 2.9 twice since admission 10. Chronic low Mg: Mg-1.2-->1.7-->recheck in am             --continue Mg Ox. IV supplementation prn  -5/30 MG stable at 1.7  11. Metastatic non-small cell lung CA: Oxygen prn. Chemo on hold for now --pancytopenia improving. Plt improved from 84-->174. Hgb stable 8-9 range post transfusion.  --chronic hypoxic respiratory failure treated with prn oxygen -Continue outpatient follow-up with oncology, Dr. Truett Perna  12. Malignant RML pleural effusion: Pleur X d/c 11/2022 (Dr. Irena Cords)             --CTA chest 03/05/23 w/stable small right effusion, stable RML central mass. Prior hx of lymphoma treated with chemo 2014 14. Anxiety: Ativan tid prn 15. chronic  hypoxic respiratory failure             -She uses 2 L O2 nasal cannula as needed, currently on room air  6/1-2 stable on RA currently 16. CKD 3a.     -6/3 Cr 1.19      Latest Ref Rng & Units 03/27/2023    3:28 AM 03/26/2023    3:48 AM 03/22/2023    4:08 AM  BMP  Glucose 70 - 99 mg/dL  89  95   BUN 8 - 23 mg/dL  11  10   Creatinine 1.61 - 1.00 mg/dL  0.96  0.45   Sodium 409 - 145 mmol/L  140  140   Potassium 3.5 - 5.1 mmol/L 4.0  3.4  3.6   Chloride 98 - 111 mmol/L  107  107   CO2 22 - 32 mmol/L  27  26   Calcium 8.9 - 10.3 mg/dL  8.1  8.2   6/5 potassium 4.0 after supp, BUN/Cr stable  17. Obesity with BMI 33             -Dietary counseling 18.  History of DVT/PE.  Continue Eliquis 5 mg twice a day 19.  Sinus tachycardia.  Continue metoprolol.  Outpatient cardiology follow-up-likely multifactorial will enc fluids,   -6/5 persistent tachycardia---observe how she's tolerating therapy. Denies and palpitations or sob   -metoprolol increased to 50mg  tid yesterday       03/28/2023    5:30 AM 03/28/2023    5:26 AM 03/28/2023    5:24 AM  Vitals with BMI  Systolic 112 110 811  Diastolic 80 82 97  Pulse 102 102 105    20.  Urinary retention.  Foley catheter removed 5/26, she reports she is voiding without issues, continue to monitor  -Remains continent, no new GU complaints 21.  Chronic Anemia   5/30 HGB stable at 9.0, PLT stable at 185 22.  Chronic systolic CHF.  Not on baseline  diuretics.  Monitor daily weights.   -6/5 weights stable  -RLL likely scaring/atelectasis on CXR   -IS Filed Weights   03/25/23 0500 03/26/23 0402 03/27/23 0500  Weight: 91 kg 90.9 kg 89.1 kg  23. Itching at Picc line site  -improved as above  -Continue benadryl prn 24. Hx thoracic spine mets but not in epidural space, also severe degenerative lumbar stenosis and severe lumbar spondylosis - spine stable no back pain    LOS: 7 days A FACE TO FACE EVALUATION WAS PERFORMED  Ranelle Oyster 03/28/2023,  9:01 AM

## 2023-03-28 NOTE — Progress Notes (Addendum)
Patient ID: Crystal [name], female   DOB: 1946/03/14, 77 y.o.   MRN: [id number]  Have faxed FL2 and diet form to [employer] to [place] Cline-RN at the facility for her to review. Will re-send form once TB read later this am.   1:54 PM Met with pt and spoke with niece to discuss team conference goals of supervision-mod/I and have completed paperwork for ALF. Will order a wheelchair she has the other equipment needed. [employer] discharge date is set for 6/7-Friday.

## 2023-03-28 NOTE — Progress Notes (Signed)
Physical Therapy Session Note  Patient Details  Name: Crystal [name] MRN: [id number] Date of Birth: 04/29/1946  Today's Date: 03/28/2023 PT Individual Time: 0930-0955 PT Individual Time Calculation (min): 25 min   Short Term Goals: Week 1:  PT Short Term Goal 1 (Week 1): STG=LTG 2/2 ELOS  Skilled Therapeutic Interventions/Progress Updates:    Chart reviewed and pt agreeable to therapy. Pt received seated in WC with no c/o pain. Session focused on self-care and amb to promote safe home access and independence with home activities. Pt initiated session with teeth brushing and face washing using S. Pt then completed amb of 135ft to therapy gym using S + RW. In gym, pt completed 169ft + 169ft amb using CGA + no AD. Pt noted to have increased sway and high guard when amb without AD. Pt also noted to have increased active breathing after amb indicating limited endurance. Pt then amb 145ft to return to room with CGA + no AD. Session education emphasized difference between rollator and RW and potential progression to rollator use if pt balance progresses to indicate safe use in setting of limited endurance. At end of session, pt was left seated in [facility] with alarm engaged, nurse call bell and all needs in reach.     Therapy Documentation Precautions:  Precautions Precautions: Fall Precaution Comments: monitor HR Restrictions Weight Bearing Restrictions: No Other Position/Activity Restrictions: dizziness with activity   Therapy/Group: Individual Therapy  Dionne Milo, PT, DPT 03/28/2023, 9:58 AM

## 2023-03-28 NOTE — Progress Notes (Signed)
Physical Therapy Session Note  Patient Details  Name: Crystal [name] MRN: [id number] Date of Birth: May 31, 1946  Today's Date: 03/28/2023 PT Individual Time: 0800-0859 PT Individual Time Calculation (min): 59 min   Short Term Goals: Week 1:  PT Short Term Goal 1 (Week 1): STG=LTG 2/2 ELOS  Skilled Therapeutic Interventions/Progress Updates:      Pt supine in bed upon arrival. Pt agreeable to therapy. Pt denies any pain.   Pt performed supine to sit with HOB elevated and supervision, pt denies dizziness.   Pt ambulated 1x100, 1x50, 1x130, 1x190, 1x90 feet with no AD and L HHA to increase B LE strength, balance, verbal cues provided for upward gaze, upright posture, and reciprocal gait, and to avoid reaching for furniture and instead rest R UE by side,   Pt performed bed mobility on apartment bed with supervision, with mild dizziness with rolling L and R, subsided in less than 1 min.   Pt performed ambulatory transfer with no AD to car simulator (at height of sedan) x2 with CGA/min A, verbal cues provided for technique with emphasis on safety and backing up to car into back of legs are touching then reaching back with B UE. Pt demos improved technique and safety with 2nd trial.   Pt performed 2x5 sit<>stand from level mat table with B UE support holding 3# dowel rod and CGA, verbal cues provided to scoot forward to reduce LE support on mat table.   Attempted step overs over bean bags on floor with L HHA -pt demos poor clearance and significant fear of falling with increased anxiety.   Pt performed 1x5 steps ups Step ups onto airex pad with L HHA and R UE support on chair, verbal and tactile, and visual  cues for upright psoture.   Pt seated in WC at end of session with all needs within reach and seatbelt alarm on.    Therapy Documentation Precautions:  Precautions Precautions: Fall Precaution Comments: monitor HR Restrictions Weight Bearing Restrictions: No Other Position/Activity  Restrictions: dizziness with activity Therapy/Group: Individual Therapy  [facility], [place], DPT  03/28/2023, 7:27 AM

## 2023-03-28 NOTE — Patient Care Conference (Signed)
Inpatient RehabilitationTeam Conference and Plan of Care Update Date: 03/28/2023   Time: 12:10 PM    Patient Name: Crystal [name]      Medical Record Number: [id number]  Date of Birth: [birth year] Sex: Female         Room/Bed: 4W17C/4W17C-01 Payor Info: Payor: [employer] / Plan: [employer] - [place] / Product Type: *No Product type* /    Admit Date/Time:  03/21/2023  2:26 PM  Primary Diagnosis:  Intracranial bleed [facility])  Hospital Problems: Principal Problem:   Intracranial bleed [facility])    Expected Discharge Date: Expected Discharge Date: 03/30/23  Team Members Present: Physician leading conference: Dr. Claudette Laws Social Worker Present: Dossie Der, LCSW Nurse Present: Chana Bode, RN PT Present: Ambrose Finland, PT OT Present: Jake Shark, OT PPS Coordinator present : Edson Snowball, PT     Current Status/Progress Goal Weekly Team Focus  Bowel/Bladder   cont of B&B   remain cont of B&B   Assist with toileting as needed    Swallow/Nutrition/ Hydration               ADL's   (S) UB ADLs, CGA LB ADLs, CGA ADL transfers. Barrier to d/c is discharge location- family deciding   mod I- supervision   ADL retraining, transfers, endurance, balance to reduce caregiver burden and increase independence    Mobility   amb 128 feet with CGA/sup, level of assist varies with pt fatigue/B LE tremors/pt safety, pt demos tendency to rush and sacrifice safety with fatigue/B LE tremors, pt demos improved carry over of cues during AM session on 6/4. stair navigation x3 steps with L ascending HR and B UE support with min-mod A for entry into laundry room. Transfers with sup-min A   supervision  consistency with safety with RW, and navigating turns especially with fatigue,  finalizing pt decision for discharge destination,    Communication                Safety/Cognition/ Behavioral Observations               Pain   no complaints of pain   Remain pain free   Assess pain  qshift and PRN    Skin   Redness to bilateral buttocks, cream applied   no further skin breakdown  Assess skin qshift and PRN      Discharge Planning:  Family working on ALF pt is aware and is in agreement. Will get paperwork complete and see if can manage her care needs.   Team Discussion: Patient post ICH with decreased strength and balance issues; limited by fatigue.  Patient on target to meet rehab goals: yes, currently needs supervision for upper body bathing and dressing and CGA for lower body care and transfers.  Able to ambulate up to 200' without and assistive device using HHA.  Needs CGA - supervision overall for mobility and min assist when fatigued. Goals for discharge set for mod I - supervision overall.  *See Care Plan and progress notes for long and short-term goals.   Revisions to Treatment Plan:  N/a   Teaching Needs: Safety, medications, dietary modifications, transfers, etc.   Current Barriers to Discharge: Lack of/limited family support  Possible Resolutions to Barriers: ALF placement pending     Medical Summary Current Status: hx of orthostasis/falls. had a SDH/SAH this time. Hx of lung ca followed by Dr. Truett Perna. persistent tachycardia--pt denies symptoms presently.  Barriers to Discharge: Medical stability;Hypotension   Possible Resolutions to [employer]:  beta blocker being adjusted. might need another agent. pacing/acclimation with activities. encouraging appropriate fluid intake   Continued Need for Acute Rehabilitation Level of Care: The patient requires daily medical management by a physician with specialized training in physical medicine and rehabilitation for the following reasons: Direction of a multidisciplinary physical rehabilitation program to maximize functional independence : Yes Medical management of patient stability for increased activity during participation in an intensive rehabilitation regime.: Yes Analysis of  laboratory values and/or radiology reports with any subsequent need for medication adjustment and/or medical intervention. : Yes   I attest that I was present, lead the team conference, and concur with the assessment and plan of the team.   Chana Bode B 03/28/2023, 3:50 PM

## 2023-03-29 DIAGNOSIS — C7951 Secondary malignant neoplasm of bone: Secondary | ICD-10-CM | POA: Diagnosis not present

## 2023-03-29 DIAGNOSIS — J9811 Atelectasis: Secondary | ICD-10-CM | POA: Diagnosis not present

## 2023-03-29 DIAGNOSIS — C3490 Malignant neoplasm of unspecified part of unspecified bronchus or lung: Secondary | ICD-10-CM | POA: Diagnosis not present

## 2023-03-29 DIAGNOSIS — C342 Malignant neoplasm of middle lobe, bronchus or lung: Secondary | ICD-10-CM | POA: Diagnosis not present

## 2023-03-29 DIAGNOSIS — N1831 Chronic kidney disease, stage 3a: Secondary | ICD-10-CM | POA: Diagnosis not present

## 2023-03-29 DIAGNOSIS — C8590 Non-Hodgkin lymphoma, unspecified, unspecified site: Secondary | ICD-10-CM | POA: Diagnosis not present

## 2023-03-29 DIAGNOSIS — I629 Nontraumatic intracranial hemorrhage, unspecified: Secondary | ICD-10-CM | POA: Diagnosis not present

## 2023-03-29 MED ORDER — METOPROLOL TARTRATE 50 MG PO TABS: 50.0000 mg | ORAL_TABLET | Freq: Three times a day (TID) | ORAL | Status: DC

## 2023-03-29 MED ORDER — POTASSIUM CHLORIDE CRYS ER 20 MEQ PO TBCR
10.0000 meq | EXTENDED_RELEASE_TABLET | Freq: Every day | ORAL | Status: DC
Start: 2023-03-29 — End: 2023-03-29

## 2023-03-29 MED ORDER — POTASSIUM CHLORIDE CRYS ER 20 MEQ PO TBCR
20.0000 meq | EXTENDED_RELEASE_TABLET | Freq: Every day | ORAL | Status: DC
Start: 2023-03-29 — End: 2023-03-30

## 2023-03-29 MED ORDER — FERROUS SULFATE 325 (65 FE) MG PO TABS: 325.0000 mg | ORAL_TABLET | Freq: Every day | ORAL | 3 refills | Status: DC

## 2023-03-29 NOTE — Progress Notes (Addendum)
Patient ID: Crystal [name], female   DOB: 15-May-1946, 77 y.o.   MRN: [id number]  Have faxed follow up PT and OT prescription to [facility]. Wheelchair to be delivered to pt's room. Facility wants priority discharge summary-Pam-PA aware of this. Pt feels ready to go tomorrow. Will have a packet for pt to take with her tomorrow.  2;27 PM Pt to call [employer] to pay the co-pay for the wheelchair so can be delivered.

## 2023-03-29 NOTE — Progress Notes (Signed)
Physical Therapy Session Note  Patient Details  Name: Crystal [name] MRN: [id number] Date of Birth: Feb 19, 1946  Today's Date: 03/29/2023 PT Individual Time: 0900-0955 PT Individual Time Calculation (min): 55 min   Short Term Goals: Week 1:  PT Short Term Goal 1 (Week 1): STG=LTG 2/2 ELOS  Skilled Therapeutic Interventions/Progress Updates: Pt presented in bed agreeable to therapy. Pt denies pain at rest. Performed bed mobility mod I with use of bed features. Pt noted to already have shoes on therefore performed ambulatory transfer to w/c with RW and supervision. Pt transported to ortho gym for energy conservation and time management. Pt participated in car transfer with supervision and demonstrated good safety overall. Pt then ambulated ~135ft towards main gym with RW and supervision. Pt noted to initially show good pacing then increased gait speed. Pt quickly fatigued and then indicated urgency to sit. Pt was able to propel w/c remaining distance to main gym and after extended rest participated in ascending/descending x 4 steps with bilateral rails and CGA. Pt noted to ascend/descend with step to pattern but stedy and did not indicate significant DOE after activity. Pt then ambulated on non compliant surface with first bout pt moving with RW far in front of pt and pt lifting RW to advance. Pt then returned to w/c with PTA demonstrating safer method of moving RW which also required less effort. Pt performed task again with improved safety and did not fatigue as quickly. After extended rest pt ambulated towards room with supervision with w/c follow and PTA providing cues for pacing. Pt able to ambulate 116ft with supervision before requiring seated rest. Pt then propelled w/c remaining distance back to room and performed stand pivot transfer back to room. Pt transferred back to supine mod I and left resting in bed with bed alarm on, call bell within reach and needs met.       Therapy  Documentation Precautions:  Precautions Precautions: Fall Precaution Comments: monitor HR Restrictions Weight Bearing Restrictions: No Other Position/Activity Restrictions: dizziness with activity  Therapy/Group: Individual Therapy  Felicitas Sine 03/29/2023, 10:01 AM

## 2023-03-29 NOTE — Progress Notes (Signed)
PROGRESS NOTE   Subjective/Complaints:  Pt without complaints. Fatigues with therapy but is able to manage with rest breaks. Says that family has picked out an ALF and that a room is available tomorrow. Family is moving in some furniture and belongings for her.    Review of Systems  Constitutional:  Negative for chills and fever.  HENT:  Negative for congestion.   Eyes:  Negative for double vision.  Respiratory:  Positive for shortness of breath. Negative for cough.   Cardiovascular:  Negative for chest pain.  Gastrointestinal:  Negative for abdominal pain, diarrhea, nausea and vomiting.  Genitourinary: Negative.   Musculoskeletal:  Negative for back pain and neck pain.  Skin:  Negative for itching and rash.  Neurological:  Negative for headaches.     Objective:   No results found. No results for input(s): "WBC", "HGB", "HCT", "PLT" in the last 72 hours.  Recent Labs    03/27/23 0328  K 4.0     Intake/Output Summary (Last 24 hours) at 03/29/2023 0854 Last data filed at 03/29/2023 0754 Gross per 24 hour  Intake 440 ml  Output --  Net 440 ml      Pressure Injury 03/06/23 Buttocks Right;Medial Stage 2 -  Partial thickness loss of dermis presenting as a shallow open injury with a red, pink wound bed without slough. (Active)  03/06/23 0050  Location: Buttocks  Location Orientation: Right;Medial  Staging: Stage 2 -  Partial thickness loss of dermis presenting as a shallow open injury with a red, pink wound bed without slough.  Wound Description (Comments):   Present on Admission: Yes    Physical Exam: Vital Signs Blood pressure 107/81, pulse 96, temperature 98 F (36.7 C), temperature source Oral, resp. rate 20, height 5\' 5"  (1.651 m), weight 90.2 kg, SpO2 94 %.   Constitutional: No distress . Vital signs reviewed. HEENT: NCAT, EOMI, oral membranes moist Neck: supple Cardiovascular: still tachy around 95-100  without murmur. No JVD    Respiratory/Chest: CTA Bilaterally without wheezes or rales. Normal effort    GI/Abdomen: BS +, non-tender, non-distended Ext: no clubbing, cyanosis, or edema Psych: pleasant and cooperative  Skin: No evidence of breakdown, no evidence of rash Neuro: Alert and awake, follows commands, cranial nerves II through XII intact, normal speech and language Moving all 4 extremities to gravity Sensation intact light touch in all 4 extremities Musculoskeletal:  No joint swelling or tenderness noted Skin: PICC site clean,     Assessment/Plan: 1. Functional deficits which require 3+ hours per day of interdisciplinary therapy in a comprehensive inpatient rehab setting. Physiatrist is providing close team supervision and 24 hour management of active medical problems listed below. Physiatrist and rehab team continue to assess barriers to discharge/monitor patient progress toward functional and medical goals  Care Tool:  Bathing    Body parts bathed by patient: Right arm, Left arm, Chest, Abdomen, Front perineal area, Buttocks, Right upper leg, Left upper leg, Right lower leg, Left lower leg, Face         Bathing assist Assist Level: Supervision/Verbal cueing     Upper Body Dressing/Undressing Upper body dressing   What is the patient wearing?: Pull  over shirt    Upper body assist Assist Level: Set up assist    Lower Body Dressing/Undressing Lower body dressing      What is the patient wearing?: Underwear/pull up, Pants     Lower body assist Assist for lower body dressing: Supervision/Verbal cueing     Toileting Toileting    Toileting assist Assist for toileting: Supervision/Verbal cueing     Transfers Chair/bed transfer  Transfers assist     Chair/bed transfer assist level: Supervision/Verbal cueing     Locomotion Ambulation   Ambulation assist      Assist level: Contact Guard/Touching assist Assistive device: Walker-rolling Max  distance: 180   Walk 10 feet activity   Assist  Walk 10 feet activity did not occur: Safety/medical concerns  Assist level: Contact Guard/Touching assist Assistive device: Walker-rolling   Walk 50 feet activity   Assist Walk 50 feet with 2 turns activity did not occur: Safety/medical concerns  Assist level: Contact Guard/Touching assist Assistive device: Walker-rolling    Walk 150 feet activity   Assist Walk 150 feet activity did not occur: Safety/medical concerns  Assist level: Contact Guard/Touching assist Assistive device: Walker-rolling    Walk 10 feet on uneven surface  activity   Assist Walk 10 feet on uneven surfaces activity did not occur: Safety/medical concerns         Wheelchair     Assist Is the patient using a wheelchair?: Yes Type of Wheelchair: Manual    Wheelchair assist level: Supervision/Verbal cueing Max wheelchair distance: 150    Wheelchair 50 feet with 2 turns activity    Assist        Assist Level: Supervision/Verbal cueing   Wheelchair 150 feet activity     Assist      Assist Level: Supervision/Verbal cueing   Blood pressure 107/81, pulse 96, temperature 98 F (36.7 C), temperature source Oral, resp. rate 20, height 5\' 5"  (1.651 m), weight 90.2 kg, SpO2 94 %.  Medical Problem List and Plan: 1. Functional deficits secondary to subdural/subarachnoid hemorrhage likely traumatic due to fall, she has history of orthostatic hypotension and BPPV with recurrent falls.             -patient may shower, cover PICC line             -ELOS/Goals: dc tomorrow to ALF  -finalize dc planning                2.  Antithrombotics: -DVT/anticoagulation:  Pharmaceutical: Eliquis             -antiplatelet therapy:  3. Pain Management: N/A--tylenol prn available.  4. Mood/Behavior/Sleep: LCSW to follow for evaluation and support.                       --Melatonin prn for insomnia.              -antipsychotic agents: N/A 5.  Neuropsych/cognition: This patient is capable of making decisions on her own behalf. 6. Skin/Wound Care: Routine pressure relief measures.   - PICC area better 7. Fluids/Electrolytes/Nutrition: Monitor I/O.              -- intake adequate 8. BPPV: Vestibular eval and treat.  9. Recurrent hypokalemia: Will start daily supplement as was low as 2.8  and 2.9 twice since admission 10. Chronic low Mg: Mg-1.2-->1.7--              --continue Mg Ox.  Most recent level 1.7  -po intake is  good 11. Metastatic non-small cell lung CA: Oxygen prn. Chemo on hold for now --pancytopenia improving. Plt improved from 84-->174. Hgb stable 8-9 range post transfusion.  --chronic hypoxic respiratory failure treated with prn oxygen -Continue outpatient follow-up with oncology, Dr. Truett Perna  12. Malignant RML pleural effusion: Pleur X d/c 11/2022 (Dr. Irena Cords)             --CTA chest 03/05/23 w/stable small right effusion, stable RML central mass. Prior hx of lymphoma treated with chemo 2014 14. Anxiety: Ativan tid prn 15. chronic hypoxic respiratory failure             -She uses 2 L O2 nasal cannula as needed, currently on room air  6/6 stable on ra, paces self with therapy 16. CKD 3a.     -6/3 Cr 1.19      Latest Ref Rng & Units 03/27/2023    3:28 AM 03/26/2023    3:48 AM 03/22/2023    4:08 AM  BMP  Glucose 70 - 99 mg/dL  89  95   BUN 8 - 23 mg/dL  11  10   Creatinine 1.61 - 1.00 mg/dL  0.96  0.45   Sodium 409 - 145 mmol/L  140  140   Potassium 3.5 - 5.1 mmol/L 4.0  3.4  3.6   Chloride 98 - 111 mmol/L  107  107   CO2 22 - 32 mmol/L  27  26   Calcium 8.9 - 10.3 mg/dL  8.1  8.2   6/5 potassium 4.0 after supp, BUN/Cr stable  17. Obesity with BMI 33             -Dietary counseling 18.  History of DVT/PE.  Continue Eliquis 5 mg twice a day 19.  Sinus tachycardia.  Continue metoprolol.  Outpatient cardiology follow-up-likely multifactorial will enc fluids,   -6/6  tachycardia---    -pt paces herself with  therapy, probably more d/t lungs than HR itself   -metoprolol increased to 50mg  tid with slight improvement   -hesitate to add another agent as bp is already soft   -if we decrease metoprolol and add a CCB, we may end up in a worse spot than where we are---continue with metoprolol as is for now         03/29/2023    4:28 AM 03/29/2023    3:45 AM 03/28/2023    7:37 PM  Vitals with BMI  Weight 198 lbs 14 oz    BMI 33.09    Systolic  107 101  Diastolic  81 71  Pulse  96 98    20.  Urinary retention.  Foley catheter removed 5/26, she reports she is voiding without issues, continue to monitor  -Remains continent, no new GU complaints 21.  Chronic Anemia   5/30 HGB stable at 9.0, PLT stable at 185 22.  Chronic systolic CHF.  Not on baseline diuretics.  Monitor daily weights.   -6/5 weights stable  -RLL likely scaring/atelectasis on CXR   -IS Filed Weights   03/26/23 0402 03/27/23 0500 03/29/23 0428  Weight: 90.9 kg 89.1 kg 90.2 kg    24. Hx thoracic spine mets but not in epidural space, also severe degenerative lumbar stenosis and severe lumbar spondylosis - spine stable no back pain    LOS: 8 days A FACE TO FACE EVALUATION WAS PERFORMED  Crystal [name] 03/29/2023, 8:54 AM

## 2023-03-29 NOTE — Telephone Encounter (Signed)
Per Dr. Truett Perna: Schedule for lab/flush/OV and atezolizumab on 6/10 or 6/11. High priority scheduling message sent.

## 2023-03-29 NOTE — Plan of Care (Signed)
  Problem: RH Balance Goal: LTG Patient will maintain dynamic standing with ADLs (OT) Description: LTG:  Patient will maintain dynamic standing balance with assist during activities of daily living (OT)  Outcome: Completed/Met   Problem: Sit to Stand Goal: LTG:  Patient will perform sit to stand in prep for activites of daily living with assistance level (OT) Description: LTG:  Patient will perform sit to stand in prep for activites of daily living with assistance level (OT) Outcome: Completed/Met   Problem: RH Grooming Goal: LTG Patient will perform grooming w/assist,cues/equip (OT) Description: LTG: Patient will perform grooming with assist, with/without cues using equipment (OT) Outcome: Completed/Met   Problem: RH Bathing Goal: LTG Patient will bathe all body parts with assist levels (OT) Description: LTG: Patient will bathe all body parts with assist levels (OT) Outcome: Completed/Met   Problem: RH Dressing Goal: LTG Patient will perform upper body dressing (OT) Description: LTG Patient will perform upper body dressing with assist, with/without cues (OT). Outcome: Completed/Met Goal: LTG Patient will perform lower body dressing w/assist (OT) Description: LTG: Patient will perform lower body dressing with assist, with/without cues in positioning using equipment (OT) Outcome: Completed/Met   Problem: RH Toileting Goal: LTG Patient will perform toileting task (3/3 steps) with assistance level (OT) Description: LTG: Patient will perform toileting task (3/3 steps) with assistance level (OT)  Outcome: Completed/Met   Problem: RH Simple Meal Prep Goal: LTG Patient will perform simple meal prep w/assist (OT) Description: LTG: Patient will perform simple meal prep with assistance, with/without cues (OT). Outcome: Completed/Met   Problem: RH Laundry Goal: LTG Patient will perform laundry w/assist, cues (OT) Description: LTG: Patient will perform laundry with assistance,  with/without cues (OT). Outcome: Completed/Met   Problem: RH Light Housekeeping Goal: LTG Patient will perform light housekeeping w/assist (OT) Description: LTG: Patient will perform light housekeeping with assistance, with/without cues (OT). Outcome: Completed/Met   Problem: RH Toilet Transfers Goal: LTG Patient will perform toilet transfers w/assist (OT) Description: LTG: Patient will perform toilet transfers with assist, with/without cues using equipment (OT) Outcome: Completed/Met   Problem: RH Tub/Shower Transfers Goal: LTG Patient will perform tub/shower transfers w/assist (OT) Description: LTG: Patient will perform tub/shower transfers with assist, with/without cues using equipment (OT) Outcome: Completed/Met   

## 2023-03-29 NOTE — Plan of Care (Signed)
  Problem: RH Balance Goal: LTG Patient will maintain dynamic sitting balance (PT) Description: LTG:  Patient will maintain dynamic sitting balance with assistance during mobility activities (PT) Outcome: Completed/Met Goal: LTG Patient will maintain dynamic standing balance (PT) Description: LTG:  Patient will maintain dynamic standing balance with assistance during mobility activities (PT) Outcome: Completed/Met   Problem: Sit to Stand Goal: LTG:  Patient will perform sit to stand with assistance level (PT) Description: LTG:  Patient will perform sit to stand with assistance level (PT) Outcome: Completed/Met   Problem: RH Bed Mobility Goal: LTG Patient will perform bed mobility with assist (PT) Description: LTG: Patient will perform bed mobility with assistance, with/without cues (PT). Outcome: Completed/Met   Problem: RH Bed to Chair Transfers Goal: LTG Patient will perform bed/chair transfers w/assist (PT) Description: LTG: Patient will perform bed to chair transfers with assistance (PT). Outcome: Completed/Met   Problem: RH Ambulation Goal: LTG Patient will ambulate in controlled environment (PT) Description: LTG: Patient will ambulate in a controlled environment, # of feet with assistance (PT). Outcome: Completed/Met Goal: LTG Patient will ambulate in home environment (PT) Description: LTG: Patient will ambulate in home environment, # of feet with assistance (PT). Outcome: Completed/Met

## 2023-03-29 NOTE — Progress Notes (Addendum)
Inpatient Rehabilitation Discharge Medication Review by a Pharmacist  A complete drug regimen review was completed for this patient to identify any potential clinically significant medication issues.  High Risk Drug Classes Is patient taking? Indication by Medication  Antipsychotic No   Anticoagulant Yes Apixaban - DVT/PE  Antibiotic No   Opioid No   Antiplatelet No   Hypoglycemics/insulin No   Vasoactive Medication Yes Lopressor - tachycardia  Chemotherapy No   Other Yes Folic acid - supplement Lorazepam - prn anxiety Ferrous sulfate - anemia Potassium, magnesium - supplement     Type of Medication Issue Identified Description of Issue Recommendation(s)  Drug Interaction(s) (clinically significant)     Duplicate Therapy     Allergy     No Medication Administration End Date     Incorrect Dose     Additional Drug Therapy Needed     Significant med changes from prior encounter (inform family/care partners about these prior to discharge).    Other       Clinically significant medication issues were identified that warrant physician communication and completion of prescribed/recommended actions by midnight of the next day:  No  Pharmacist comments: None  Time spent performing this drug regimen review (minutes):  30 minutes   Thank you for allowing pharmacy to be a part of this patient's care.   Signe Colt, PharmD 03/30/2023 8:01 AM  **Pharmacist phone directory can be found on [email or web address] listed under [facility]**

## 2023-03-29 NOTE — Discharge Summary (Signed)
Physician Discharge Summary  Patient ID: Crystal [name] MRN: [id number] DOB/AGE: 1946/02/09 77 y.o.  Admit date: 03/21/2023 Discharge date: 03/30/2023  Discharge Diagnoses:  Principal Problem:   Intracranial bleed [facility]) Active Problems:   Moderate obesity   Tremor   Primary cancer of right middle lobe of lung (HCC)   Metastasis to spinal column (HCC)   Metastatic non-small cell lung cancer (HCC)   Sinus tachycardia   DNR (do not resuscitate)/DNI(Do Not Intubate)   Discharged Condition: stable  Significant Diagnostic Studies: DG Chest 2 View  Result Date: 03/26/2023 CLINICAL DATA:  Follow-up exam. EXAM: CHEST - 2 VIEW COMPARISON:  03/06/2023. FINDINGS: Unchanged left upper extremity PICC with tip projecting over the superior cavoatrial junction. Unchanged bandlike opacities in the right lower lung, favored to reflect atelectasis and/or scarring. No consolidation or pulmonary edema. Stable cardiac and mediastinal contours. No pleural effusion or pneumothorax. IMPRESSION: Unchanged bandlike opacities in the right lower lung, favored to reflect atelectasis and/or scarring. Electronically Signed   By: Orvan Falconer M.D.   On: 03/26/2023 16:16   MR BRAIN WO CONTRAST  Result Date: 03/07/2023 CLINICAL DATA:  Syncope/presyncope, cerebrovascular cause suspected EXAM: MRI HEAD WITHOUT CONTRAST TECHNIQUE: Multiplanar, multiecho pulse sequences of the brain and surrounding structures were obtained without intravenous contrast. COMPARISON:  None Available. FINDINGS: Brain: Approximately 4 mm thick small subdural and potentially subarachnoid FLAIR hyperintensity with associated susceptibility artifact, T1 hyperintensity and diffusion restriction, suspicious for recent hemorrhage. Outside of this region, no other areas of restricted diffusion to suggest acute infarct. No midline shift. No hydrocephalus or mass lesion. Patchy T2/FLAIR hyperintensities in the white matter, nonspecific but compatible with  chronic microvascular ischemic disease. Possible small remote right parietal cortical infarct. Vascular: Major arterial flow voids are maintained skull base. Skull and upper cervical spine: Normal marrow signal. Sinuses/Orbits: Right maxillary sinus mucosal thickening. No acute orbital findings. Other: Trace right mastoid effusion. IMPRESSION: Findings suspicious for recent 4 mm thick small subdural and potentially subarachnoid hemorrhage along the anterior left falx. No significant mass effect. A noncontrast CT of the head is recommended to further characterize and establish a baseline for follow-up. These results will be called to the ordering clinician or representative by the Radiologist Assistant, and communication documented in the PACS or [employer]. Electronically Signed   By: Feliberto Harts M.D.   On: 03/07/2023 14:29   DG Chest Port 1 View  Result Date: 03/06/2023 CLINICAL DATA:  Syncope. EXAM: PORTABLE CHEST 1 VIEW COMPARISON:  Mar 05, 2023 FINDINGS: The heart size and mediastinal contours are within normal limits. There is moderate severity calcification of the aortic arch. Low lung volumes are noted. Stable mild to moderate severity right middle lobe and right lower lobe Karim, atelectasis and/or infiltrate is seen. No pleural effusion or pneumothorax is identified. Radiopaque surgical clips are seen along the lateral aspect of the right chest wall. The visualized skeletal structures are unremarkable. IMPRESSION: Low lung volumes with stable mild to moderate severity right middle lobe and right lower lobe atelectasis and/or infiltrate. Electronically Signed   By: Aram Candela M.D.   On: 03/06/2023 22:38   CT Angio Chest PE W and/or Wo Contrast  Result Date: 03/05/2023 CLINICAL DATA:  Positive D-dimer. Chest pain. History of lung cancer. EXAM: CT ANGIOGRAPHY CHEST WITH CONTRAST TECHNIQUE: Multidetector CT imaging of the chest was performed using the standard protocol during bolus  administration of intravenous contrast. Multiplanar CT image reconstructions and MIPs were obtained to evaluate the vascular anatomy. RADIATION DOSE  REDUCTION: This exam was performed according to the departmental dose-optimization program which includes automated exposure control, adjustment of the mA and/or kV according to patient size and/or use of iterative reconstruction technique. CONTRAST:  75mL OMNIPAQUE IOHEXOL 350 MG/ML SOLN COMPARISON:  Chest CT 01/12/2023 FINDINGS: Cardiovascular: Satisfactory opacification of the pulmonary arteries to the segmental level. No evidence of pulmonary embolism. Normal heart size. No pericardial effusion. There are atherosclerotic calcifications of the aorta. Right chest port has been removed in the interval. There is a new left-sided central venous catheter with distal tip ending at the brachiocephalic SVC junction. Mediastinum/Nodes: Mildly enlarged subcarinal lymph node measuring 1 cm appears unchanged. Other nonenlarged, prominent mediastinal lymph nodes appear stable. The esophagus and thyroid gland are within normal limits. Lungs/Pleura: Small partially loculated right pleural effusion appears stable. Right middle lobe central mass near the hilum appears unchanged measuring proximally 3.8 x 2.5 cm. There is some new right lower lobe peribronchial wall thickening. Linear opacities in the right middle lobe, right lower lobe and lingula appear unchanged. There is some mild patchy ground-glass opacities in both lungs. Upper Abdomen: Cholecystectomy clips are present. Musculoskeletal: There is a small fluid collection in the anterior chest wall subcutaneous tissues measuring 1.2 x 1.1 cm likely related to prior chest port. There surgical clips and skin thickening in the right breast, unchanged. Right axillary surgical clips are again noted. No acute fractures are seen. Degenerative changes affect the spine. Review of the MIP images confirms the above findings. IMPRESSION: 1.  No evidence for pulmonary embolism. 2. New right lower lobe peribronchial wall thickening worrisome for bronchitis. Metastatic disease not excluded. 3. Stable small partially loculated right pleural effusion. 4. Stable right middle lobe central mass. 5. Stable mediastinal lymphadenopathy. 6. New small fluid collection in the anterior chest wall likely related to prior chest port. 7. New left-sided central venous catheter with distal tip ending at the brachiocephalic SVC junction. Aortic Atherosclerosis (ICD10-I70.0). Electronically Signed   By: Darliss Cheney M.D.   On: 03/05/2023 22:49   CT Chest Wo Contrast  Result Date: 03/05/2023 CLINICAL DATA:  Abnormal x-ray EXAM: CT CHEST WITHOUT CONTRAST TECHNIQUE: Multidetector CT imaging of the chest was performed following the standard protocol without IV contrast. RADIATION DOSE REDUCTION: This exam was performed according to the departmental dose-optimization program which includes automated exposure control, adjustment of the mA and/or kV according to patient size and/or use of iterative reconstruction technique. COMPARISON:  Chest x-ray 03/05/2023, CT chest 01/12/2023, 09/13/2022 FINDINGS: Cardiovascular: Limited evaluation without intravenous contrast. Left-sided central venous catheter tip at the cavoatrial region. Mild aortic atherosclerosis. No aneurysm. Normal cardiac size. Trace pericardial effusion Mediastinum/Nodes: Midline trachea. No thyroid mass. Subcentimeter mediastinal lymph nodes. Decreased right paratracheal node on series 3, image 20, measuring 7 mm, previously 9 mm. Esophagus within normal limits. Lungs/Pleura: Small slightly loculated right pleural effusion with interim removal of previously noted right-sided chest tube. Fluid quantity is overall decreased compared to prior chest CT. Pleural thickening is also decreased compared to prior chest CT. Vague right middle lobe lung mass measuring about 3.4 by 1.9 cm on series 3, image 30, previously  3.6 x 2.3 cm. Mild right middle lobe air bronchograms with overall slightly improved aeration of right middle lobe compared to the prior chest CT. Upper Abdomen: No acute finding. 13 mm hypodensity within the superior spleen series 3, image 42 better seen with contrast, stable with recent priors. Musculoskeletal: No acute or suspicious osseous abnormality. Old left ninth rib fracture. IMPRESSION: 1. Removal  of right-sided chest tube since prior CT from March. Small slightly loculated right pleural effusion, overall decreased compared to the prior chest CT with decreased pleural thickening. Slightly improved aeration of right middle lobe compared to prior chest CT. Vague right middle lobe lung mass appears slightly decreased in size compared to prior chest CT. 2. Decreased size of multiple mediastinal lymph nodes. 3. Aortic atherosclerosis. Aortic Atherosclerosis (ICD10-I70.0). Electronically Signed   By: Jasmine Pang M.D.   On: 03/05/2023 20:39   DG Chest Port 1 View  Result Date: 03/05/2023 CLINICAL DATA:  Questionable sepsis - evaluate for abnormality EXAM: PORTABLE CHEST 1 VIEW COMPARISON:  01/12/2023 FINDINGS: Interval removal of Port-A-Cath. Rounded opacity in the right mid to lower lung may reflect fluid within the fissure. Versus airspace opacity. Right pleural effusion or pleural thickening noted. Left lung clear. Heart and mediastinal contours within normal limits. Left PICC line in place with the tip in the SVC. IMPRESSION: Rounded opacity in the right mid to lower lung may reflect loculated fluid within the fissure or rounded opacity/airspace disease. Right pleural effusion versus pleural thickening. Findings could be further evaluated with chest CT if felt clinically indicated. Electronically Signed   By: Charlett Nose M.D.   On: 03/05/2023 19:14    Labs:  Basic Metabolic Panel: Recent Labs  Lab 03/26/23 0348 03/27/23 0328 03/30/23 0500  NA 140  --  141  K 3.4* 4.0 3.5  CL 107  --  104   CO2 27  --  25  GLUCOSE 89  --  94  BUN 11  --  13  CREATININE 1.19*  --  1.19*  CALCIUM 8.1*  --  8.2*  MG  --   --  1.6*    CBC:    Latest Ref Rng & Units 03/26/2023    3:48 AM 03/22/2023    4:08 AM 03/21/2023    5:00 AM  CBC  WBC 4.0 - 10.5 K/uL 4.1  4.1  4.0   Hemoglobin 12.0 - 15.0 g/dL 8.6  9.0  8.8   Hematocrit 36.0 - 46.0 % 28.3  29.3  27.9   Platelets 150 - 400 K/uL 186  185  174      CBG: No results for input(s): "GLUCAP" in the last 168 hours.  Brief HPI:   Crystal [name] is a 77 y.o. female with history of right breast cancer, lymphoma, ICH, chronic BLE tremors, DVT/PE, metastatic NSCLC to spine, chronic malignant pleural effusion, chronic hypoxic respiratory failure with ongoing chemo/oxygen use as needed, sepsis due to MSSA bacteremia, chemo induced pancytopenia with recent admissions and issues with hypotension as well as tachycardia.  She was evaluated in ED 03/06/2023 and sent home but got dizzy and fell with inability to get up.  She was admitted on 05/15 for workup and started on Rocephin due to concerns of UTI.  MRI brain done showing persistent dizziness and revealed suspicion of recent 4 mm thick small SDH and potentially SAH along falx.  Dr. Franky Macho was consulted for input and recommended holding DOAC for 74 hours then to consider risks of bleeding versus DVT patient is high fall risk.  As patient's mentation improved Eliquis was resumed.    She was found to have heme positive stool however hemoglobin at baseline and bleed felt to be due to chemo and low platelets, no workup recommended and cleared to resume DOAC  by Dr. Dulce Sellar.  She did have Foley placement briefly due to urinary retention.  Flomax was  added and was noted to be voiding without difficulty.  She was also transfused with 1 unit PRBC for drop in hemoglobin to 7.1.  Therapy was working with patient and she was noted to have "red nystagmus treated with Dix-Hallpike and Epley's.  She was limited by tremors,  decreased posture, tachycardia with activity as well as fatigue.  CIR was recommended due to functional decline.   Hospital Course: Crystal [name] was admitted to rehab 03/21/2023 for inpatient therapies to consist of PT and OT at least three hours five days a week. Past admission physiatrist, therapy team and rehab RN have worked together to provide customized collaborative inpatient rehab.  Blood pressure has been monitored on TID basis and orthostatic changes have resolved.  She continues to have some tachycardia with activity and Lopressor was titrated up to 50 mg 3 times daily.  She is tolerating this without any side effects.  Her activity tolerance and endurance has greatly improved but she continues to require rest breaks with activity.   She is continent of bowel and bladder.  Respiratory status has been stable on room air.  Follow-up check of electrolytes showed mild hypokalemia which has resolved with supplementation.  Renal status shows mild elevation to 1.19.  She has been encouraged to increase fluid intake and maintain adequate hydration to avoid recurrent orthostatic symptoms.  Follow-up CBC shows H&H and platelets to be relatively stable.  She has made good gains during his stay with supervision is recommended for safety.  Family has elected an assisted living facility and she will continue to receive follow-up PT and OT at [facility], ALF after discharge.   Rehab course: During patient's stay in rehab weekly team conferences were held to monitor patient's progress, set goals and discuss barriers to discharge. At admission, patient required mod assist with basic ADL task and with mobility. She  has had improvement in activity tolerance, balance, postural control as well as ability to compensate for deficits.  She is able to complete ADL tasks with supervision.  She requires supervision for transfers and to ambulate 160 feet with rolling walker.  She has been educated on utilizing rest breaks  and pacing herself.  She will continue to receive follow-up home health    Disposition: Assisted living facility  Diet: Regular  Special Instructions: 1.  Needs supervision with mobility. 2. Drink plenty of fluids to keep hydrated.  3. Use flutter valve four times a day.    Allergies as of 03/30/2023       Reactions   Simvastatin Other (See Comments)   Leg cramps   Zoloft [sertraline] Nausea Only   Codeine Other (See Comments)   Bad Headaches   Coreg Other (See Comments)   "Made my legs hurt"   Lisinopril Cough   Tizanidine Hcl Rash, Other (See Comments)   Hypotension, also        Medication List     STOP taking these medications    acetaminophen 325 MG tablet Commonly known as: TYLENOL   meclizine 25 MG tablet Commonly known as: ANTIVERT   oxyCODONE 5 MG immediate release tablet Commonly known as: Oxy IR/ROXICODONE   pantoprazole 40 MG tablet Commonly known as: PROTONIX   tamsulosin 0.4 MG Caps capsule Commonly known as: FLOMAX       TAKE these medications    Eliquis 5 MG Tabs tablet Generic drug: apixaban TAKE 1 TABLET(5 MG) BY MOUTH TWICE DAILY What changed: See the new instructions.   ferrous sulfate 325 (65  FE) MG tablet Take 1 tablet (325 mg total) by mouth daily with lunch. What changed: when to take this   folic acid 1 MG tablet Commonly known as: FOLVITE Take 1 tablet (1 mg total) by mouth daily.   LORazepam 0.5 MG tablet Commonly known as: ATIVAN Take 1 tablet (0.5 mg total) by mouth 2 (two) times daily as needed for anxiety.   magnesium oxide 400 (240 Mg) MG tablet Commonly known as: MAG-OX Take 1 tablet (400 mg total) by mouth 2 (two) times daily.   metoprolol tartrate 50 MG tablet Commonly known as: LOPRESSOR Take 1 tablet (50 mg total) by mouth 3 (three) times daily. What changed:  medication strength how much to take   potassium chloride SA 20 MEQ tablet Commonly known as: KLOR-CON M Take 1 tablet (20 mEq total) by mouth  daily. What changed:  medication strength how much to take when to take this        Follow-up Information     Blair Heys, MD Follow up.   Specialty: Family Medicine Why: Call in 1-2 days for post hospital follow up Contact information: [address] [place] [address] [phone]         Ladene Artist, MD Follow up.   Specialty: Oncology Why: Call in 1-2 days for post hospital follow up Contact information: [address] [place] [address] [phone]         Fanny Dance, MD. Call.   Specialty: Physical Medicine and Rehabilitation Why: As needed Contact information: [address] [place] [address] [phone]                 Signed: Jacquelynn Cree 03/30/2023, 9:25 AM

## 2023-03-29 NOTE — Progress Notes (Addendum)
Nutrition Follow-up  DOCUMENTATION CODES:   Not applicable  INTERVENTION:  - Continue Regular diet.   NUTRITION DIAGNOSIS:   Increased nutrient needs related to wound healing as evidenced by estimated needs.  GOAL:   Patient will meet greater than or equal to 90% of their needs  MONITOR:   PO intake  REASON FOR ASSESSMENT:   Malnutrition Screening Tool    ASSESSMENT:   77 y.o. female admits to CIR related to functional deficits secondary to subdural/subarachnoid hemorrhage. PMH includes: breast cancer, dyslipidemia, HTN, GERD, IBS, lymphoma, non hodgkins lymphoma.  Meds reviewed: ferrous sulfate, folic acid, mag-ox, klor-con. Labs reviewed: creatinine elevated.   Pt ate 75% of her breakfast this am. The pt has eaten mostly 50-100% of her meals over the past 7 days. Pt is meeting her needs at this time. Wound healing vitamins were discontinued. Pt planning to dc tomorrow.   Diet Order:   Diet Order             Diet regular Room service appropriate? Yes; Fluid consistency: Thin  Diet effective now                   EDUCATION NEEDS:   Not appropriate for education at this time  Skin:  Skin Assessment: Skin Integrity Issues: Skin Integrity Issues:: Stage II Stage II: right buttocks  Last BM:  6/3 - type 5  Height:   Ht Readings from Last 1 Encounters:  03/21/23 5\' 5"  (1.651 m)    Weight:   Wt Readings from Last 1 Encounters:  03/29/23 90.2 kg    Ideal Body Weight:     BMI:  Body mass index is 33.09 kg/m.  Estimated Nutritional Needs:   Kcal:  2200-2500 kcals  Protein:  110-125 gm  Fluid:  >/= 2.2 L  Bethann Humble, RD, LDN, CNSC.

## 2023-03-29 NOTE — Progress Notes (Addendum)
Inpatient Rehabilitation Care Coordinator Discharge Note   Patient Details  Name: Crystal [name] MRN: [id number] Date of Birth: [birth year]   Discharge location: GOING TO [facility]  Length of Stay: 9 DAYS  Discharge activity level: SUPERVISION LEVEL  Home/community participation: ACTIVE  Patient response IO:NGEXBM Literacy - How often do you need to have someone help you when you read instructions, pamphlets, or other written material from your doctor or pharmacy?: Never  Patient response WU:XLKGMW Isolation - How often do you feel lonely or isolated from those around you?: Never  Services provided included: MD, RD, PT, OT, RN, CM, TR, Pharmacy, Neuropsych, SW  Financial Services:  Field seismologist Utilized: Private Insurance [employer]  Choices offered to/list presented to: PT AND NIECE  Follow-up services arranged:  Other (Comment) (ALF WILL SET UP FOLLOW UP PT & OT) ADAPT-WHEELCHAIR PT HAS ALL OTHER NEEDED EQUIPMENT FROM PREVIOUS ADMISSIONS     GAVE PACKET TO PT WITH DC SUMMARY, THERAPIES NOTES, FL2 AND TB AND DIET ORDERS ALONG WITH THERAPY ORDER      Patient response to transportation need: Is the patient able to respond to transportation needs?: Yes In the past 12 months, has lack of transportation kept you from medical appointments or from getting medications?: No In the past 12 months, has lack of transportation kept you from meetings, work, or from getting things needed for daily living?: No   Patient/Family verbalized understanding of follow-up arrangements:  Yes  Individual responsible for coordination of the follow-up plan: SELF AND Crystal [name] /POA-NIECE [phone]  Confirmed correct DME delivered: Crystal [name], Crystal [name] 03/29/2023    Comments (or additional information): PT DID WELL BUT IS TOO MUCH CARE FOR HER ELDERLY SISTER. PLAN TO MOVE TO ALF. PT, SISTER;S AND NIECE ALL ON BOARD WITH THIS AND IN AGREEMENT. PACKET GIVEN TO PT AND  PAPERWORK SENT-FL2, TB, DIET ORDER, DC SUMMARY AHEAD ALONG WITH THEIR RN EVALUATOR CAME TO HOSPITAL TO EVALUATE PT TO MAKE SURE MEETS ADMISSION CRITERIA.  Summary of Stay    Date/Time Discharge Planning CSW  03/28/23 [phone] Family working on ALF pt is aware and is in agreement. Will get paperwork complete and see if can manage her care needs. RGD       Lucy Chris

## 2023-03-29 NOTE — Progress Notes (Signed)
Physical Therapy Discharge Summary  Patient Details  Name: Crystal [name] MRN: [id number] Date of Birth: 27-Oct-1945  Date of Discharge from PT service:March 29, 2023  Today's Date: 03/29/2023 PT Individual Time: 1300-1410 PT Individual Time Calculation (min): 70 min    Patient has met 5 of 5 long term goals due to improved activity tolerance, improved balance, improved postural control, increased strength, ability to compensate for deficits, and improved coordination.  Patient to discharge at an ambulatory level Modified Independent.   Patient's care partner is independent to provide the necessary physical assistance at discharge.  Recommendation:  Patient will benefit from ongoing skilled PT services in home health setting to continue to advance safe functional mobility, address ongoing impairments in strength, balance, endurance, gait pattern, and minimize fall risk.  Equipment: 20x16 WC with roho cushion, RW (pt has RW at home)   Reasons for discharge: treatment goals met and discharge from hospital  Patient/family agrees with progress made and goals achieved: Yes  PT Discharge Precautions/Restrictions Precautions Precautions: Fall Precaution Comments: monitor HR Restrictions Weight Bearing Restrictions: No Other Position/Activity Restrictions: dizziness with activity Pain Interference Pain Interference Pain Effect on Sleep: 0. Does not apply - I have not had any pain or hurting in the past 5 days Pain Interference with Therapy Activities: 0. Does not apply - I have not received rehabilitationtherapy in the past 5 days Pain Interference with Day-to-Day Activities: 1. Rarely or not at all Vision/Perception  Vision - History Ability to See in Adequate Light: 0 Adequate Perception Perception: Within Functional Limits Praxis Praxis: Intact  Cognition Overall Cognitive Status: Within Functional Limits for tasks assessed Arousal/Alertness: Awake/alert Orientation Level:  Oriented X4 Memory: Appears intact Awareness: Appears intact Problem Solving: Appears intact Safety/Judgment: Appears intact Sensation Sensation Light Touch: Appears Intact Hot/Cold: Appears Intact Proprioception: Appears Intact Stereognosis: Appears Intact Coordination Gross Motor Movements are Fluid and Coordinated: No Fine Motor Movements are Fluid and Coordinated: Yes Coordination and Movement Description: generalized weakness Finger Nose Finger Test: [facility] Motor  Motor Motor: Other (comment) Motor - Discharge Observations: generalized weakness  Mobility Bed Mobility Bed Mobility: Rolling Right;Rolling Left;Supine to Sit;Sit to Supine (increased time) Rolling Right: Independent with assistive device Rolling Left: Independent with assistive device Supine to Sit: Independent with assistive device Sit to Supine: Independent with assistive device Transfers Transfers: Sit to Stand;Stand Pivot Transfers Sit to Stand: Supervision/Verbal cueing Stand Pivot Transfers: Supervision/Verbal cueing Stand Pivot Transfer Details: Verbal cues for technique;Verbal cues for precautions/safety Transfer (Assistive device): Rolling walker Locomotion  Gait Ambulation: Yes Gait Assistance: Supervision/Verbal cueing Gait Distance (Feet): 160 Feet Assistive device: Rolling walker Gait Assistance Details: Verbal cues for precautions/safety;Verbal cues for gait pattern Gait Gait: Yes Gait Pattern: Impaired Gait Pattern: Decreased step length - right;Decreased step length - left;Step-to pattern;Decreased stance time - left;Decreased stance time - right;Decreased hip/knee flexion - left;Decreased hip/knee flexion - right;Decreased dorsiflexion - left;Decreased dorsiflexion - right;Poor foot clearance - left;Poor foot clearance - right Gait velocity: slow with no AD, fast with AD, verbal cues provided for safety with RW and ambulatory transfers Stairs / Additional Locomotion Stairs: Yes Stairs  Assistance: Contact Guard/Touching assist Stair Management Technique: Two rails Number of Stairs: 4 Height of Stairs: 6 Ramp: Supervision/Verbal cueing (with RW) Pick up small object from the floor assist level: Supervision/Verbal cueing Pick up small object from the floor assistive device: narrow cone Wheelchair Mobility Wheelchair Mobility: Yes Wheelchair Assistance: Independent with assistive device Wheelchair Propulsion: Both upper extremities Distance: 150  Trunk/Postural Assessment  Cervical  Assessment Cervical Assessment: Within Functional Limits Thoracic Assessment Thoracic Assessment: Within Functional Limits Lumbar Assessment Lumbar Assessment: Within Functional Limits Postural Control Postural Control: Deficits on evaluation Righting Reactions: delayed righting reactions  Balance Berg Balance Test Sit to Stand: Able to stand using hands after several tries Standing Unsupported: Unable to stand 30 seconds unassisted Sitting with Back Unsupported but Feet Supported on Floor or Stool: Able to sit safely and securely 2 minutes Stand to Sit: Uses backs of legs against chair to control descent Transfers: Able to transfer with verbal cueing and /or supervision Standing Unsupported with Eyes Closed: Needs help to keep from falling Standing Ubsupported with Feet Together: Needs help to attain position and unable to hold for 15 seconds From Standing, Reach Forward with Outstretched Arm: Loses balance while trying/requires external support From Standing Position, Pick up Object from Floor: Unable to try/needs assist to keep balance From Standing Position, Turn to Look Behind Over each Shoulder: Needs supervision when turning Turn 360 Degrees: Needs assistance while turning Standing Unsupported, Alternately Place Feet on Step/Stool: Needs assistance to keep from falling or unable to try Standing Unsupported, One Foot in Front: Loses balance while stepping or standing Standing on  One Leg: Unable to try or needs assist to prevent fall Total Score: 11 Static Sitting Balance Static Sitting - Balance Support: Feet supported Static Sitting - Level of Assistance: 7: Independent Dynamic Sitting Balance Dynamic Sitting - Balance Support: Feet supported Dynamic Sitting - Level of Assistance: 7: Independent Sitting balance - Comments: donning shoes and socks with supervision while seated EOB. Static Standing Balance Static Standing - Balance Support: Bilateral upper extremity supported Static Standing - Level of Assistance: 5: Stand by assistance (supervision) Dynamic Standing Balance Dynamic Standing - Balance Support: Bilateral upper extremity supported Dynamic Standing - Level of Assistance: 5: Stand by assistance (supervision) Extremity Assessment  RLE Assessment RLE Assessment: Exceptions to [facility] General Strength Comments: grossly 4/5 with exception of hip abduction and extension 3/5 LLE Assessment LLE Assessment: Exceptions to [facility] General Strength Comments: grossly 4/5 with exception of hip abduction and extension 3/5   Treatment Session 1   Pt supine in bed upon arrival. Pt agreeable to therapy. Pt denies any pain. Reviewed and performed the following HEP handout provided, verbal cues provided for compensation reduction, education provided to do every day at sink with locked wheelchair behind pt with someone providing supervision for safety. Reviewed methods to reduce fall risk including walking at all times with RW, recognizing signs/symptoms of fatigue and being proactive with rest breaks, utilzing B UE support for controlled descent with ambulatory transfers, not ambulating when dizzy or with LE tremors. Education provided to wear life alert at all times, and call for help in instance of a fall.   Pt ambulated 5x150 feet with RW and supervision, verbal cues provided to walk within frame of RW and for initiation of rest breaks once pt begins to experience SOB, and  safety with ambulatory transfer. Pt seated in bed at end of session with bed alarm on and all needs within reach.  Access Code: [email or web address] URL: [email or web address] Date: 03/29/2023 Prepared by: Ambrose Finland  Exercises - Supine Bridge  - 1 x daily - 7 x weekly - 3 sets - 10 reps - Standing Hip Abduction with Counter Support  - 1 x daily - 7 x weekly - 3 sets - 10 reps - Standing Hip Extension with Counter Support  - 1 x daily - 7 x weekly - 3 sets - 10  reps - Standing Hip Extension with Counter Support  - 1 x daily - 7 x weekly - 3 sets - 10 reps - Standing March with Counter Support  - 1 x daily - 7 x weekly - 3 sets - 10 reps  [facility] [place], [place], DPT  03/29/2023, 3:56 PM

## 2023-03-30 ENCOUNTER — Other Ambulatory Visit: Payer: Self-pay

## 2023-03-30 DIAGNOSIS — I5022 Chronic systolic (congestive) heart failure: Secondary | ICD-10-CM | POA: Diagnosis not present

## 2023-03-30 DIAGNOSIS — R000 Tachycardia, unspecified: Secondary | ICD-10-CM | POA: Diagnosis not present

## 2023-03-30 DIAGNOSIS — S069XAD Unspecified intracranial injury with loss of consciousness status unknown, subsequent encounter: Secondary | ICD-10-CM

## 2023-03-30 LAB — BASIC METABOLIC PANEL
Anion gap: 12 (ref 5–15)
BUN: 13 mg/dL (ref 8–23)
CO2: 25 mmol/L (ref 22–32)
Calcium: 8.2 mg/dL — ABNORMAL LOW (ref 8.9–10.3)
Chloride: 104 mmol/L (ref 98–111)
Creatinine, Ser: 1.19 mg/dL — ABNORMAL HIGH (ref 0.44–1.00)
GFR, Estimated: 47 mL/min — ABNORMAL LOW (ref >60)
Glucose, Bld: 94 mg/dL (ref 70–99)
Potassium: 3.5 mmol/L (ref 3.5–5.1)
Sodium: 141 mmol/L (ref 135–145)

## 2023-03-30 LAB — MAGNESIUM: Magnesium: 1.6 mg/dL — ABNORMAL LOW (ref 1.7–2.4)

## 2023-03-30 MED ORDER — APIXABAN 5 MG PO TABS: 5.0000 mg | ORAL_TABLET | Freq: Two times a day (BID) | ORAL | 0 refills | Status: DC

## 2023-03-30 MED ORDER — POTASSIUM CHLORIDE CRYS ER 20 MEQ PO TBCR
20.0000 meq | EXTENDED_RELEASE_TABLET | Freq: Every day | ORAL | 0 refills | Status: DC
Start: 2023-03-30 — End: 2024-02-08

## 2023-03-30 MED ORDER — METOPROLOL TARTRATE 50 MG PO TABS: 50.0000 mg | ORAL_TABLET | Freq: Three times a day (TID) | ORAL | 0 refills | Status: DC

## 2023-03-30 MED ORDER — MAGNESIUM OXIDE -MG SUPPLEMENT 400 (240 MG) MG PO TABS
400.0000 mg | ORAL_TABLET | Freq: Two times a day (BID) | ORAL | 0 refills | Status: DC
Start: 2023-03-30 — End: 2024-08-21

## 2023-03-30 MED ORDER — MAGNESIUM OXIDE -MG SUPPLEMENT 400 (240 MG) MG PO TABS
400.0000 mg | ORAL_TABLET | Freq: Two times a day (BID) | ORAL | 0 refills | Status: DC
Start: 2023-03-30 — End: 2023-03-30

## 2023-03-30 MED ORDER — FOLIC ACID 1 MG PO TABS: 1.0000 mg | ORAL_TABLET | Freq: Every day | ORAL | 5 refills | Status: DC

## 2023-03-30 NOTE — Progress Notes (Signed)
Patient seen by PA before discharge. No further questions. Transferred in wheelchair with 2 techs.

## 2023-03-30 NOTE — Progress Notes (Signed)
PROGRESS NOTE   Subjective/Complaints:  Pt doing well. Excited to discharge today. [birth year]!. Feels that HR has been better controlled overall.    Review of Systems  Constitutional:  Negative for chills and fever.  HENT:  Negative for congestion.   Eyes:  Negative for double vision.  Respiratory:  Positive for shortness of breath. Negative for cough.   Cardiovascular:  Negative for chest pain.       Tachycardia  Gastrointestinal:  Negative for abdominal pain, diarrhea, nausea and vomiting.  Genitourinary: Negative.   Musculoskeletal:  Negative for back pain and neck pain.  Skin:  Negative for itching and rash.  Neurological:  Negative for headaches.     Objective:   No results found. No results for input(s): "WBC", "HGB", "HCT", "PLT" in the last 72 hours.  Recent Labs    03/30/23 0500  NA 141  K 3.5  CL 104  CO2 25  GLUCOSE 94  BUN 13  CREATININE 1.19*  CALCIUM 8.2*     Intake/Output Summary (Last 24 hours) at 03/30/2023 1610 Last data filed at 03/30/2023 0817 Gross per 24 hour  Intake 720 ml  Output --  Net 720 ml          Physical Exam: Vital Signs Blood pressure (!) 117/92, pulse 88, temperature 98.1 F (36.7 C), temperature source Oral, resp. rate 20, height 5\' 5"  (1.651 m), weight 89.2 kg, SpO2 97 %.   Constitutional: No distress . Vital signs reviewed. HEENT: NCAT, EOMI, oral membranes moist Neck: supple Cardiovascular: RRR without murmur. No JVD    Respiratory/Chest: CTA Bilaterally without wheezes or rales. Normal effort    GI/Abdomen: BS +, non-tender, non-distended Ext: no clubbing, cyanosis, or edema Psych: pleasant and cooperative  Skin: No evidence of breakdown, no evidence of rash Neuro: Alert and awake, follows commands, cranial nerves II through XII intact, normal speech and language Moving all 4 extremities to gravity Sensation intact light touch in all 4  extremities Musculoskeletal:  No joint swelling or tenderness noted Skin: PICC site clean,     Assessment/Plan: 1. Functional deficits which require 3+ hours per day of interdisciplinary therapy in a comprehensive inpatient rehab setting. Physiatrist is providing close team supervision and 24 hour management of active medical problems listed below. Physiatrist and rehab team continue to assess barriers to discharge/monitor patient progress toward functional and medical goals  Care Tool:  Bathing    Body parts bathed by patient: Right arm, Left arm, Chest, Abdomen, Front perineal area, Buttocks, Right upper leg, Left upper leg, Right lower leg, Left lower leg, Face         Bathing assist Assist Level: Supervision/Verbal cueing     Upper Body Dressing/Undressing Upper body dressing   What is the patient wearing?: Pull over shirt    Upper body assist Assist Level: Set up assist    Lower Body Dressing/Undressing Lower body dressing      What is the patient wearing?: Underwear/pull up, Pants     Lower body assist Assist for lower body dressing: Supervision/Verbal cueing     Toileting Toileting    Toileting assist Assist for toileting: Supervision/Verbal cueing  Transfers Chair/bed transfer  Transfers assist     Chair/bed transfer assist level: Supervision/Verbal cueing     Locomotion Ambulation   Ambulation assist      Assist level: Supervision/Verbal cueing Assistive device: Walker-rolling Max distance: 150   Walk 10 feet activity   Assist  Walk 10 feet activity did not occur: Safety/medical concerns  Assist level: Supervision/Verbal cueing Assistive device: Walker-rolling   Walk 50 feet activity   Assist Walk 50 feet with 2 turns activity did not occur: Safety/medical concerns  Assist level: Supervision/Verbal cueing Assistive device: Walker-rolling    Walk 150 feet activity   Assist Walk 150 feet activity did not occur:  Safety/medical concerns  Assist level: Supervision/Verbal cueing Assistive device: Walker-rolling    Walk 10 feet on uneven surface  activity   Assist Walk 10 feet on uneven surfaces activity did not occur: Safety/medical concerns   Assist level: Supervision/Verbal cueing Assistive device: Walker-rolling   Wheelchair     Assist Is the patient using a wheelchair?: Yes Type of Wheelchair: Manual    Wheelchair assist level: Independent Max wheelchair distance: 150    Wheelchair 50 feet with 2 turns activity    Assist        Assist Level: Independent   Wheelchair 150 feet activity     Assist      Assist Level: Independent   Blood pressure (!) 117/92, pulse 88, temperature 98.1 F (36.7 C), temperature source Oral, resp. rate 20, height 5\' 5"  (1.651 m), weight 89.2 kg, SpO2 97 %.  Medical Problem List and Plan: 1. Functional deficits secondary to subdural/subarachnoid hemorrhage likely traumatic due to fall, she has history of orthostatic hypotension and BPPV with recurrent falls.             -Dc to ALF today  -f/u with Dr. Truett Perna as well as [facility]                2.  Antithrombotics: -DVT/anticoagulation:  Pharmaceutical: Eliquis             -antiplatelet therapy:  3. Pain Management: N/A--tylenol prn available.  4. Mood/Behavior/Sleep: LCSW to follow for evaluation and support.                       --Melatonin prn for insomnia.              -antipsychotic agents: N/A 5. Neuropsych/cognition: This patient is capable of making decisions on her own behalf. 6. Skin/Wound Care: Routine pressure relief measures.   - PICC area better 7. Fluids/Electrolytes/Nutrition: Monitor I/O.              -- intake adequate 8. BPPV: Vestibular eval and treat.  9. Recurrent hypokalemia: Will start daily supplement as was low as 2.8  and 2.9 twice since admission 10. Chronic low Mg: Mg-1.2-->1.7--              --continue Mg Ox.  Most recent level 1.7  -po intake is  good 11. Metastatic non-small cell lung CA: Oxygen prn. Chemo on hold for now --pancytopenia improving. Plt improved from 84-->174. Hgb stable 8-9 range post transfusion.  --chronic hypoxic respiratory failure treated with prn oxygen - follow-up with oncology, Dr. Truett Perna --ctx to resume monday 12. Malignant RML pleural effusion: Pleur X d/c 11/2022 (Dr. Irena Cords)             --CTA chest 03/05/23 w/stable small right effusion, stable RML central mass. Prior hx of lymphoma treated with chemo  2014 14. Anxiety: Ativan tid prn 15. chronic hypoxic respiratory failure             -She uses 2 L O2 nasal cannula as needed, currently on room air  6/6 stable on ra, paces self with therapy 16. CKD 3a.     -6/3 Cr 1.19      Latest Ref Rng & Units 03/30/2023    5:00 AM 03/27/2023    3:28 AM 03/26/2023    3:48 AM  BMP  Glucose 70 - 99 mg/dL 94   89   BUN 8 - 23 mg/dL 13   11   Creatinine 1.61 - 1.00 mg/dL 0.96   0.45   Sodium 409 - 145 mmol/L 141   140   Potassium 3.5 - 5.1 mmol/L 3.5  4.0  3.4   Chloride 98 - 111 mmol/L 104   107   CO2 22 - 32 mmol/L 25   27   Calcium 8.9 - 10.3 mg/dL 8.2   8.1   6/6 Cr 8.11, K+ 3.5, continue kdur supp  17. Obesity with BMI 33             -Dietary counseling 18.  History of DVT/PE.  Continue Eliquis 5 mg twice a day 19.  Sinus tachycardia.  Continue metoprolol.  Outpatient cardiology follow-up-likely multifactorial will enc fluids,   -6/7 tachycardia. HR better this morning, fairly controlled yesterday   -continue metoprolol TID    -acclimation/aerobic activities      03/30/2023    3:34 AM 03/29/2023    7:56 PM 03/29/2023   12:53 PM  Vitals with BMI  Weight 196 lbs 10 oz    BMI 32.72    Systolic 117 109 914  Diastolic 92 75 82  Pulse 88 100 100    20.  Urinary retention.  Foley catheter removed 5/26, she reports she is voiding without issues, continue to monitor  -Remains continent, no new GU complaints 21.  Chronic Anemia   5/30 HGB stable at 9.0, PLT  stable at 185 22.  Chronic systolic CHF.  Not on baseline diuretics.  Monitor daily weights.   -6/7 weights stable  -RLL scaring/atelectasis on CXR   -continue IS Filed Weights   03/27/23 0500 03/29/23 0428 03/30/23 0334  Weight: 89.1 kg 90.2 kg 89.2 kg    24. Hx thoracic spine mets but not in epidural space, also severe degenerative lumbar stenosis and severe lumbar spondylosis - spine stable no back pain    LOS: 9 days A FACE TO FACE EVALUATION WAS PERFORMED  Ranelle Oyster 03/30/2023, 9:22 AM

## 2023-04-02 ENCOUNTER — Encounter: Payer: Self-pay | Admitting: Nurse Practitioner

## 2023-04-02 ENCOUNTER — Inpatient Hospital Stay: Payer: No Typology Code available for payment source

## 2023-04-02 ENCOUNTER — Inpatient Hospital Stay: Payer: No Typology Code available for payment source | Admitting: Nurse Practitioner

## 2023-04-02 ENCOUNTER — Inpatient Hospital Stay: Payer: No Typology Code available for payment source | Attending: Oncology

## 2023-04-02 VITALS — BP 101/70 | HR 90 | Temp 98.2°F | Resp 18 | Ht 65.0 in | Wt 199.6 lb

## 2023-04-02 DIAGNOSIS — C342 Malignant neoplasm of middle lobe, bronchus or lung: Secondary | ICD-10-CM

## 2023-04-02 DIAGNOSIS — C3490 Malignant neoplasm of unspecified part of unspecified bronchus or lung: Secondary | ICD-10-CM

## 2023-04-02 DIAGNOSIS — Z452 Encounter for adjustment and management of vascular access device: Secondary | ICD-10-CM | POA: Insufficient documentation

## 2023-04-02 DIAGNOSIS — Z5112 Encounter for antineoplastic immunotherapy: Secondary | ICD-10-CM | POA: Diagnosis not present

## 2023-04-02 LAB — CBC WITH DIFFERENTIAL (CANCER CENTER ONLY)
Abs Immature Granulocytes: 0.01 10*3/uL (ref 0.00–0.07)
Basophils Absolute: 0 10*3/uL (ref 0.0–0.1)
Basophils Relative: 1 %
Eosinophils Absolute: 0 10*3/uL (ref 0.0–0.5)
Eosinophils Relative: 1 %
HCT: 29.9 % — ABNORMAL LOW (ref 36.0–46.0)
Hemoglobin: 9.4 g/dL — ABNORMAL LOW (ref 12.0–15.0)
Immature Granulocytes: 0 %
Lymphocytes Relative: 35 %
Lymphs Abs: 1.5 10*3/uL (ref 0.7–4.0)
MCH: 31.5 pg (ref 26.0–34.0)
MCHC: 31.4 g/dL (ref 30.0–36.0)
MCV: 100.3 fL — ABNORMAL HIGH (ref 80.0–100.0)
Monocytes Absolute: 0.4 10*3/uL (ref 0.1–1.0)
Monocytes Relative: 10 %
Neutro Abs: 2.2 10*3/uL (ref 1.7–7.7)
Neutrophils Relative %: 53 %
Platelet Count: 197 10*3/uL (ref 150–400)
RBC: 2.98 MIL/uL — ABNORMAL LOW (ref 3.87–5.11)
RDW: 18.2 % — ABNORMAL HIGH (ref 11.5–15.5)
WBC Count: 4.2 10*3/uL (ref 4.0–10.5)
nRBC: 0 % (ref 0.0–0.2)

## 2023-04-02 LAB — CMP (CANCER CENTER ONLY)
ALT: 8 U/L (ref 0–44)
AST: 18 U/L (ref 15–41)
Albumin: 3.1 g/dL — ABNORMAL LOW (ref 3.5–5.0)
Alkaline Phosphatase: 76 U/L (ref 38–126)
Anion gap: 7 (ref 5–15)
BUN: 16 mg/dL (ref 8–23)
CO2: 26 mmol/L (ref 22–32)
Calcium: 8.6 mg/dL — ABNORMAL LOW (ref 8.9–10.3)
Chloride: 107 mmol/L (ref 98–111)
Creatinine: 1.1 mg/dL — ABNORMAL HIGH (ref 0.44–1.00)
GFR, Estimated: 51 mL/min — ABNORMAL LOW (ref >60)
Glucose, Bld: 95 mg/dL (ref 70–99)
Potassium: 3.9 mmol/L (ref 3.5–5.1)
Sodium: 140 mmol/L (ref 135–145)
Total Bilirubin: 0.3 mg/dL (ref 0.3–1.2)
Total Protein: 6.3 g/dL — ABNORMAL LOW (ref 6.5–8.1)

## 2023-04-02 MED ORDER — SODIUM CHLORIDE 0.9 % IV SOLN: Freq: Once | INTRAVENOUS | Status: AC

## 2023-04-02 MED ORDER — FAMOTIDINE IN NACL 20-0.9 MG/50ML-% IV SOLN: 20.0000 mg | Freq: Once | INTRAVENOUS | Status: DC

## 2023-04-02 MED ORDER — SODIUM CHLORIDE 0.9% FLUSH
10.0000 mL | INTRAVENOUS | Status: DC | PRN
  Administered 2023-04-02: 10 mL

## 2023-04-02 MED ORDER — SODIUM CHLORIDE 0.9 % IV SOLN: 150.0000 mg | Freq: Once | INTRAVENOUS | Status: DC

## 2023-04-02 MED ORDER — DIPHENHYDRAMINE HCL 50 MG/ML IJ SOLN: 50.0000 mg | Freq: Once | INTRAMUSCULAR | Status: DC

## 2023-04-02 MED ORDER — HEPARIN SOD (PORK) LOCK FLUSH 100 UNIT/ML IV SOLN
500.0000 [IU] | Freq: Once | INTRAVENOUS | Status: AC | PRN
  Administered 2023-04-02: 500 [IU]

## 2023-04-02 MED ORDER — PALONOSETRON HCL INJECTION 0.25 MG/5ML: 0.2500 mg | Freq: Once | INTRAVENOUS | Status: DC

## 2023-04-02 MED ORDER — SODIUM CHLORIDE 0.9 % IV SOLN
1200.0000 mg | Freq: Once | INTRAVENOUS | Status: AC
  Administered 2023-04-02: 1200 mg via INTRAVENOUS
  Filled 2023-04-02: qty 20

## 2023-04-02 MED ORDER — SODIUM CHLORIDE 0.9 % IV SOLN: 10.0000 mg | Freq: Once | INTRAVENOUS | Status: DC
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
## 2023-04-02 NOTE — Patient Instructions (Signed)
[facility] [address]  Discharge Instructions: Thank you for choosing [facility] to provide your oncology and hematology care.   If you have a lab appointment with the Cancer Center, please go directly to the Cancer Center and check in at the registration area.   Wear comfortable clothing and clothing appropriate for easy access to any Portacath or PICC line.   We strive to give you quality time with your provider. You may need to reschedule your appointment if you arrive late (15 or more minutes).  Arriving late affects you and other patients whose appointments are after yours.  Also, if you miss three or more appointments without notifying the office, you may be dismissed from the clinic at the provider's discretion.      For prescription refill requests, have your pharmacy contact our office and allow 72 hours for refills to be completed.    Today you received the following chemotherapy and/or immunotherapy agents Tecentriq      To help prevent nausea and vomiting after your treatment, we encourage you to take your nausea medication as directed.  BELOW ARE SYMPTOMS THAT SHOULD BE REPORTED IMMEDIATELY: *FEVER GREATER THAN 100.4 F (38 C) OR HIGHER *CHILLS OR SWEATING *NAUSEA AND VOMITING THAT IS NOT CONTROLLED WITH YOUR NAUSEA MEDICATION *UNUSUAL SHORTNESS OF BREATH *UNUSUAL BRUISING OR BLEEDING *URINARY PROBLEMS (pain or burning when urinating, or frequent urination) *BOWEL PROBLEMS (unusual diarrhea, constipation, pain near the anus) TENDERNESS IN MOUTH AND THROAT WITH OR WITHOUT PRESENCE OF ULCERS (sore throat, sores in mouth, or a toothache) UNUSUAL RASH, SWELLING OR PAIN  UNUSUAL VAGINAL DISCHARGE OR ITCHING   Items with * indicate a potential emergency and should be followed up as soon as possible or go to the Emergency Department if any problems should occur.  Please show the CHEMOTHERAPY ALERT CARD or IMMUNOTHERAPY ALERT CARD at check-in  to the Emergency Department and triage nurse.  Should you have questions after your visit or need to cancel or reschedule your appointment, please contact [facility]  Dept: [phone]  and follow the prompts.  Office hours are 8:00 a.m. to 4:30 p.m. Monday - Friday. Please note that voicemails left after 4:00 p.m. may not be returned until the following business day.  We are closed weekends and major holidays. You have access to a nurse at all times for urgent questions. Please call the main number to the clinic Dept: [phone] and follow the prompts.   For any non-urgent questions, you may also contact your provider using MyChart. We now offer e-Visits for anyone 72 and older to request care online for non-urgent symptoms. For details visit [email or web address].   Also download the MyChart app! Go to the app store, search "MyChart", open the app, select [facility], and log in with your MyChart username and password.

## 2023-04-02 NOTE — Progress Notes (Signed)
Patient seen by Lonna Cobb NP today  Vitals are within treatment parameters.  Labs reviewed by Lonna Cobb NP and are within treatment parameters.  Per physician team,  Patient is ready for treatment and will be received Tecentriq

## 2023-04-03 ENCOUNTER — Other Ambulatory Visit: Payer: Self-pay

## 2023-04-09 ENCOUNTER — Inpatient Hospital Stay: Payer: No Typology Code available for payment source

## 2023-04-09 ENCOUNTER — Inpatient Hospital Stay: Payer: No Typology Code available for payment source | Admitting: Nurse Practitioner

## 2023-04-09 ENCOUNTER — Inpatient Hospital Stay: Payer: No Typology Code available for payment source | Attending: Oncology

## 2023-04-09 DIAGNOSIS — Z5112 Encounter for antineoplastic immunotherapy: Secondary | ICD-10-CM | POA: Diagnosis not present

## 2023-04-09 NOTE — Patient Instructions (Signed)

## 2023-04-10 ENCOUNTER — Telehealth: Payer: Self-pay | Admitting: Nurse Practitioner

## 2023-04-10 NOTE — Telephone Encounter (Signed)
I returned Crystal [name] call regarding Crystal [name]'s treatment plan.  At present Crystal [name] is being treated with single agent atezolizumab.  She has not tolerated the chemotherapy portion well and performance status is not adequate at this time to continue chemotherapy.  The plan for now is to continue atezolizumab.

## 2023-04-12 ENCOUNTER — Ambulatory Visit: Payer: No Typology Code available for payment source | Admitting: Nurse Practitioner

## 2023-04-12 ENCOUNTER — Ambulatory Visit: Payer: No Typology Code available for payment source

## 2023-04-12 ENCOUNTER — Telehealth: Payer: Self-pay | Admitting: Cardiology

## 2023-04-12 ENCOUNTER — Other Ambulatory Visit: Payer: No Typology Code available for payment source

## 2023-04-12 NOTE — Telephone Encounter (Signed)
Patient is returning LPN's call. Please advise. 

## 2023-04-12 NOTE — Telephone Encounter (Signed)
Patient calling to schedule hospital f/u. Appointment scheduled for 7/2

## 2023-04-12 NOTE — Telephone Encounter (Signed)
Call to patient.  Lvm to call office 

## 2023-04-12 NOTE — Telephone Encounter (Signed)
Patient is requesting call back to discuss medication change in hospital and appts. Please advise.

## 2023-04-16 ENCOUNTER — Inpatient Hospital Stay: Payer: No Typology Code available for payment source

## 2023-04-16 ENCOUNTER — Ambulatory Visit (HOSPITAL_BASED_OUTPATIENT_CLINIC_OR_DEPARTMENT_OTHER)
Admission: RE | Admit: 2023-04-16 | Discharge: 2023-04-16 | Disposition: A | Discharge status: Home / Self Care | Payer: No Typology Code available for payment source | Source: Ambulatory Visit | Attending: Nurse Practitioner | Admitting: Nurse Practitioner

## 2023-04-16 ENCOUNTER — Inpatient Hospital Stay (HOSPITAL_BASED_OUTPATIENT_CLINIC_OR_DEPARTMENT_OTHER): Payer: No Typology Code available for payment source | Admitting: Nurse Practitioner

## 2023-04-16 ENCOUNTER — Encounter: Payer: Self-pay | Admitting: Nurse Practitioner

## 2023-04-16 ENCOUNTER — Telehealth: Payer: Self-pay

## 2023-04-16 VITALS — BP 113/64 | HR 103 | Temp 100.2°F | Resp 20

## 2023-04-16 VITALS — BP 117/77 | HR 100 | Temp 98.2°F | Resp 18 | Ht 65.0 in | Wt 199.0 lb

## 2023-04-16 DIAGNOSIS — Z452 Encounter for adjustment and management of vascular access device: Secondary | ICD-10-CM

## 2023-04-16 DIAGNOSIS — R0600 Dyspnea, unspecified: Secondary | ICD-10-CM | POA: Diagnosis not present

## 2023-04-16 DIAGNOSIS — R0602 Shortness of breath: Secondary | ICD-10-CM | POA: Diagnosis not present

## 2023-04-16 DIAGNOSIS — C3490 Malignant neoplasm of unspecified part of unspecified bronchus or lung: Secondary | ICD-10-CM | POA: Diagnosis not present

## 2023-04-16 DIAGNOSIS — R509 Fever, unspecified: Secondary | ICD-10-CM | POA: Diagnosis not present

## 2023-04-16 DIAGNOSIS — J122 Parainfluenza virus pneumonia: Secondary | ICD-10-CM | POA: Diagnosis not present

## 2023-04-16 MED ORDER — HEPARIN SOD (PORK) LOCK FLUSH 100 UNIT/ML IV SOLN
250.0000 [IU] | Freq: Once | INTRAVENOUS | Status: AC
  Administered 2023-04-16: 250 [IU] via INTRAVENOUS

## 2023-04-16 MED ORDER — SODIUM CHLORIDE 0.9% FLUSH
10.0000 mL | Freq: Once | INTRAVENOUS | Status: AC
  Administered 2023-04-16: 10 mL via INTRAVENOUS

## 2023-04-16 NOTE — Progress Notes (Signed)
Patient arrived to infusion room for picc line dressing change only.  Patient reported having a cough/chest congestion and runny nose that started this morning. VSS, however patient did present with a low-grade fever. Patient currently resides at an assisted living facility.  She did not report her symptoms this am to the facility as she already had an appointment with the Cancer Center.  Lonna Cobb, NP made aware of patient's symptoms and agreed to see patient today. Patient made aware and verbalized understanding.

## 2023-04-16 NOTE — Telephone Encounter (Signed)
-----   Message from Rana Snare, NP sent at 04/16/2023  3:32 PM EDT ----- Please let her know we can try Tessalon Perles or she can do over-the-counter Robitussin for the cough.  Let me know what she decides.
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
## 2023-04-16 NOTE — Telephone Encounter (Signed)
Patient gave verbal understanding and had no further questions or concerns  

## 2023-04-17 ENCOUNTER — Telehealth: Payer: Self-pay

## 2023-04-17 DIAGNOSIS — R42 Dizziness and giddiness: Secondary | ICD-10-CM | POA: Diagnosis not present

## 2023-04-17 DIAGNOSIS — F411 Generalized anxiety disorder: Secondary | ICD-10-CM | POA: Diagnosis not present

## 2023-04-17 DIAGNOSIS — I10 Essential (primary) hypertension: Secondary | ICD-10-CM | POA: Diagnosis not present

## 2023-04-17 DIAGNOSIS — Z8572 Personal history of non-Hodgkin lymphomas: Secondary | ICD-10-CM | POA: Diagnosis not present

## 2023-04-17 DIAGNOSIS — D509 Iron deficiency anemia, unspecified: Secondary | ICD-10-CM | POA: Diagnosis not present

## 2023-04-17 DIAGNOSIS — C3491 Malignant neoplasm of unspecified part of right bronchus or lung: Secondary | ICD-10-CM | POA: Diagnosis not present

## 2023-04-17 DIAGNOSIS — R0600 Dyspnea, unspecified: Secondary | ICD-10-CM | POA: Diagnosis not present

## 2023-04-17 NOTE — Telephone Encounter (Signed)
The patient has informed us that her COVID-19 test results are negative. She continues to experience a mild cough but has started taking cough medication. Additionally, she reports no fever at this time.

## 2023-04-18 ENCOUNTER — Emergency Department (HOSPITAL_COMMUNITY): Payer: No Typology Code available for payment source

## 2023-04-18 ENCOUNTER — Inpatient Hospital Stay (HOSPITAL_COMMUNITY)
Admission: EM | Admit: 2023-04-18 | Discharge: 2023-04-23 | DRG: 193 | Disposition: A | Discharge status: Skilled Nursing Facility | Payer: No Typology Code available for payment source | Attending: Family Medicine | Admitting: Family Medicine

## 2023-04-18 ENCOUNTER — Other Ambulatory Visit: Payer: Self-pay

## 2023-04-18 ENCOUNTER — Encounter (HOSPITAL_COMMUNITY): Payer: Self-pay

## 2023-04-18 DIAGNOSIS — Z853 Personal history of malignant neoplasm of breast: Secondary | ICD-10-CM

## 2023-04-18 DIAGNOSIS — D6481 Anemia due to antineoplastic chemotherapy: Secondary | ICD-10-CM | POA: Diagnosis present

## 2023-04-18 DIAGNOSIS — Z7901 Long term (current) use of anticoagulants: Secondary | ICD-10-CM

## 2023-04-18 DIAGNOSIS — R0602 Shortness of breath: Secondary | ICD-10-CM | POA: Diagnosis not present

## 2023-04-18 DIAGNOSIS — I130 Hypertensive heart and chronic kidney disease with heart failure and stage 1 through stage 4 chronic kidney disease, or unspecified chronic kidney disease: Secondary | ICD-10-CM | POA: Diagnosis present

## 2023-04-18 DIAGNOSIS — Z841 Family history of disorders of kidney and ureter: Secondary | ICD-10-CM

## 2023-04-18 DIAGNOSIS — C3490 Malignant neoplasm of unspecified part of unspecified bronchus or lung: Secondary | ICD-10-CM | POA: Diagnosis not present

## 2023-04-18 DIAGNOSIS — C342 Malignant neoplasm of middle lobe, bronchus or lung: Secondary | ICD-10-CM | POA: Diagnosis present

## 2023-04-18 DIAGNOSIS — R0689 Other abnormalities of breathing: Secondary | ICD-10-CM | POA: Diagnosis not present

## 2023-04-18 DIAGNOSIS — E7800 Pure hypercholesterolemia, unspecified: Secondary | ICD-10-CM | POA: Diagnosis present

## 2023-04-18 DIAGNOSIS — I428 Other cardiomyopathies: Secondary | ICD-10-CM | POA: Diagnosis present

## 2023-04-18 DIAGNOSIS — Z8269 Family history of other diseases of the musculoskeletal system and connective tissue: Secondary | ICD-10-CM

## 2023-04-18 DIAGNOSIS — I5032 Chronic diastolic (congestive) heart failure: Secondary | ICD-10-CM | POA: Diagnosis present

## 2023-04-18 DIAGNOSIS — Z8249 Family history of ischemic heart disease and other diseases of the circulatory system: Secondary | ICD-10-CM

## 2023-04-18 DIAGNOSIS — J189 Pneumonia, unspecified organism: Secondary | ICD-10-CM | POA: Diagnosis present

## 2023-04-18 DIAGNOSIS — C7951 Secondary malignant neoplasm of bone: Secondary | ICD-10-CM | POA: Diagnosis present

## 2023-04-18 DIAGNOSIS — J122 Parainfluenza virus pneumonia: Principal | ICD-10-CM | POA: Diagnosis present

## 2023-04-18 DIAGNOSIS — J9601 Acute respiratory failure with hypoxia: Secondary | ICD-10-CM | POA: Diagnosis present

## 2023-04-18 DIAGNOSIS — Y95 Nosocomial condition: Secondary | ICD-10-CM | POA: Diagnosis present

## 2023-04-18 DIAGNOSIS — Z1152 Encounter for screening for COVID-19: Secondary | ICD-10-CM

## 2023-04-18 DIAGNOSIS — N1832 Chronic kidney disease, stage 3b: Secondary | ICD-10-CM | POA: Diagnosis present

## 2023-04-18 DIAGNOSIS — Z8673 Personal history of transient ischemic attack (TIA), and cerebral infarction without residual deficits: Secondary | ICD-10-CM

## 2023-04-18 DIAGNOSIS — R0902 Hypoxemia: Secondary | ICD-10-CM | POA: Diagnosis not present

## 2023-04-18 DIAGNOSIS — Z800 Family history of malignant neoplasm of digestive organs: Secondary | ICD-10-CM

## 2023-04-18 DIAGNOSIS — Z66 Do not resuscitate: Secondary | ICD-10-CM | POA: Diagnosis present

## 2023-04-18 DIAGNOSIS — N179 Acute kidney failure, unspecified: Secondary | ICD-10-CM | POA: Diagnosis present

## 2023-04-18 DIAGNOSIS — R062 Wheezing: Secondary | ICD-10-CM | POA: Diagnosis not present

## 2023-04-18 DIAGNOSIS — I700 Atherosclerosis of aorta: Secondary | ICD-10-CM | POA: Diagnosis present

## 2023-04-18 DIAGNOSIS — R000 Tachycardia, unspecified: Secondary | ICD-10-CM | POA: Diagnosis not present

## 2023-04-18 DIAGNOSIS — J910 Malignant pleural effusion: Secondary | ICD-10-CM | POA: Diagnosis present

## 2023-04-18 DIAGNOSIS — I10 Essential (primary) hypertension: Secondary | ICD-10-CM | POA: Diagnosis present

## 2023-04-18 DIAGNOSIS — D631 Anemia in chronic kidney disease: Secondary | ICD-10-CM | POA: Diagnosis present

## 2023-04-18 DIAGNOSIS — Z823 Family history of stroke: Secondary | ICD-10-CM

## 2023-04-18 DIAGNOSIS — Z8572 Personal history of non-Hodgkin lymphomas: Secondary | ICD-10-CM

## 2023-04-18 DIAGNOSIS — Z79899 Other long term (current) drug therapy: Secondary | ICD-10-CM

## 2023-04-18 DIAGNOSIS — Z888 Allergy status to other drugs, medicaments and biological substances status: Secondary | ICD-10-CM

## 2023-04-18 DIAGNOSIS — R0600 Dyspnea, unspecified: Secondary | ICD-10-CM | POA: Diagnosis not present

## 2023-04-18 DIAGNOSIS — E876 Hypokalemia: Secondary | ICD-10-CM | POA: Diagnosis present

## 2023-04-18 DIAGNOSIS — Z86711 Personal history of pulmonary embolism: Secondary | ICD-10-CM | POA: Diagnosis present

## 2023-04-18 DIAGNOSIS — Z452 Encounter for adjustment and management of vascular access device: Secondary | ICD-10-CM | POA: Diagnosis not present

## 2023-04-18 DIAGNOSIS — R8281 Pyuria: Secondary | ICD-10-CM | POA: Diagnosis present

## 2023-04-18 DIAGNOSIS — D638 Anemia in other chronic diseases classified elsewhere: Secondary | ICD-10-CM | POA: Diagnosis present

## 2023-04-18 DIAGNOSIS — Z8052 Family history of malignant neoplasm of bladder: Secondary | ICD-10-CM

## 2023-04-18 DIAGNOSIS — Z885 Allergy status to narcotic agent status: Secondary | ICD-10-CM

## 2023-04-18 DIAGNOSIS — I5022 Chronic systolic (congestive) heart failure: Secondary | ICD-10-CM | POA: Diagnosis present

## 2023-04-18 LAB — COMPREHENSIVE METABOLIC PANEL
ALT: 14 U/L (ref 0–44)
AST: 22 U/L (ref 15–41)
Albumin: 2.9 g/dL — ABNORMAL LOW (ref 3.5–5.0)
Alkaline Phosphatase: 74 U/L (ref 38–126)
Anion gap: 8 (ref 5–15)
BUN: 18 mg/dL (ref 8–23)
CO2: 26 mmol/L (ref 22–32)
Calcium: 8.2 mg/dL — ABNORMAL LOW (ref 8.9–10.3)
Chloride: 104 mmol/L (ref 98–111)
Creatinine, Ser: 1.41 mg/dL — ABNORMAL HIGH (ref 0.44–1.00)
GFR, Estimated: 38 mL/min — ABNORMAL LOW (ref >60)
Glucose, Bld: 158 mg/dL — ABNORMAL HIGH (ref 70–99)
Potassium: 3.7 mmol/L (ref 3.5–5.1)
Sodium: 138 mmol/L (ref 135–145)
Total Bilirubin: 0.5 mg/dL (ref 0.3–1.2)
Total Protein: 7.3 g/dL (ref 6.5–8.1)

## 2023-04-18 LAB — BLOOD GAS, VENOUS
Acid-Base Excess: 1.6 mmol/L (ref 0.0–2.0)
Bicarbonate: 28.9 mmol/L — ABNORMAL HIGH (ref 20.0–28.0)
O2 Saturation: 67.5 %
Patient temperature: 37
pCO2, Ven: 56 mmHg (ref 44–60)
pH, Ven: 7.32 (ref 7.25–7.43)
pO2, Ven: 42 mmHg (ref 32–45)

## 2023-04-18 LAB — CBC WITH DIFFERENTIAL/PLATELET
Abs Immature Granulocytes: 0.02 10*3/uL (ref 0.00–0.07)
Basophils Absolute: 0 10*3/uL (ref 0.0–0.1)
Basophils Relative: 0 %
Eosinophils Absolute: 0 10*3/uL (ref 0.0–0.5)
Eosinophils Relative: 0 %
HCT: 33.3 % — ABNORMAL LOW (ref 36.0–46.0)
Hemoglobin: 10.1 g/dL — ABNORMAL LOW (ref 12.0–15.0)
Immature Granulocytes: 0 %
Lymphocytes Relative: 52 %
Lymphs Abs: 4.8 10*3/uL — ABNORMAL HIGH (ref 0.7–4.0)
MCH: 31.7 pg (ref 26.0–34.0)
MCHC: 30.3 g/dL (ref 30.0–36.0)
MCV: 104.4 fL — ABNORMAL HIGH (ref 80.0–100.0)
Monocytes Absolute: 0.6 10*3/uL (ref 0.1–1.0)
Monocytes Relative: 6 %
Neutro Abs: 3.9 10*3/uL (ref 1.7–7.7)
Neutrophils Relative %: 42 %
Platelets: 184 10*3/uL (ref 150–400)
RBC: 3.19 MIL/uL — ABNORMAL LOW (ref 3.87–5.11)
RDW: 16.6 % — ABNORMAL HIGH (ref 11.5–15.5)
WBC: 9.3 10*3/uL (ref 4.0–10.5)
nRBC: 0 % (ref 0.0–0.2)

## 2023-04-18 LAB — BRAIN NATRIURETIC PEPTIDE: B Natriuretic Peptide: 456.3 pg/mL — ABNORMAL HIGH (ref 0.0–100.0)

## 2023-04-18 LAB — D-DIMER, QUANTITATIVE: D-Dimer, Quant: 1.07 ug/mL-FEU — ABNORMAL HIGH (ref 0.00–0.50)

## 2023-04-18 LAB — LACTIC ACID, PLASMA: Lactic Acid, Venous: 1.6 mmol/L (ref 0.5–1.9)

## 2023-04-18 LAB — PROTIME-INR
INR: 1.7 — ABNORMAL HIGH (ref 0.8–1.2)
Prothrombin Time: 19.9 seconds — ABNORMAL HIGH (ref 11.4–15.2)

## 2023-04-18 NOTE — ED Triage Notes (Signed)
Pt BIB EMS from [facility] with [facility] since Monday. Pt has stage 4 lung cancer. PICC to left upper arm. Pt given 10 of albuterol, 1mg  of atrovent, 2 g of mag, 125 mg Solumedrol. Pt on CPAP, wheezing bilaterally.

## 2023-04-18 NOTE — ED Provider Notes (Signed)
[place] [facility] AT [facility] Provider Note   CSN: [id number] Arrival date & time: 04/18/23  2153     History {Add pertinent medical, surgical, social history, OB history to HPI:1} Chief Complaint  Patient presents with  . Shortness of Breath    Crystal [name] is a 77 y.o. female.   Shortness of Breath    Patient has a history of nonischemic cardiomyopathy, hypertension, non-Hodgkin's lymphoma, hypokalemia, pulmonary embolism, metastatic non-small cell cancer, malignant pleural effusion, chronic kidney disease who presents to the ER for evaluation of shortness of breath.  Patient states she started having symptoms on Monday.  Is not sure if she has had any fevers.  She has noticed some leg swelling.  EMS noted the patient was wheezing.  She was placed on CPAP.  She was given 10 mg of albuterol milligram of Atrovent 2 g of magnesium and 125 mg of Solu-Medrol prior to arrival  Home Medications Prior to Admission medications   Medication Sig Start Date End Date Taking? Authorizing Provider  apixaban (ELIQUIS) 5 MG TABS tablet Take 1 tablet (5 mg total) by mouth 2 (two) times daily. 03/30/23   Love, Evlyn Kanner, PA-C  ferrous sulfate 325 (65 FE) MG tablet Take 1 tablet (325 mg total) by mouth daily with lunch. 03/29/23   Love, Evlyn Kanner, PA-C  folic acid (FOLVITE) 1 MG tablet Take 1 tablet (1 mg total) by mouth daily. 03/30/23   Love, Evlyn Kanner, PA-C  LORazepam (ATIVAN) 0.5 MG tablet Take 1 tablet (0.5 mg total) by mouth 2 (two) times daily as needed for anxiety. 03/21/23   Glade Lloyd, MD  magnesium oxide (MAG-OX) 400 (240 Mg) MG tablet Take 1 tablet (400 mg total) by mouth 2 (two) times daily. 03/30/23   Love, Evlyn Kanner, PA-C  metoprolol tartrate (LOPRESSOR) 50 MG tablet Take 1 tablet (50 mg total) by mouth 3 (three) times daily. 03/30/23   Love, Evlyn Kanner, PA-C  potassium chloride SA (KLOR-CON M) 20 MEQ tablet Take 1 tablet (20 mEq total) by mouth daily. 03/30/23   Love,  Evlyn Kanner, PA-C      Allergies    Simvastatin, Zoloft [sertraline], Codeine, Coreg, Lisinopril, and Tizanidine hcl    Review of Systems   Review of Systems  Respiratory:  Positive for shortness of breath.     Physical Exam Updated Vital Signs BP 117/73 (BP Location: Right Arm)   Pulse (!) 122   Temp 99.9 F (37.7 C) (Axillary)   Resp 20   SpO2 100%  Physical Exam Vitals and nursing note reviewed.  Constitutional:      General: She is not in acute distress.    Appearance: She is well-developed.  HENT:     Head: Normocephalic and atraumatic.     Right Ear: External ear normal.     Left Ear: External ear normal.  Eyes:     General: No scleral icterus.       Right eye: No discharge.        Left eye: No discharge.     Conjunctiva/sclera: Conjunctivae normal.  Neck:     Trachea: No tracheal deviation.  Cardiovascular:     Rate and Rhythm: Regular rhythm. Tachycardia present.  Pulmonary:     Effort: Pulmonary effort is normal. No respiratory distress.     Breath sounds: No stridor. Wheezing and rhonchi present. No rales.  Abdominal:     General: Bowel sounds are normal. There is no distension.  Palpations: Abdomen is soft.     Tenderness: There is no abdominal tenderness. There is no guarding or rebound.  Musculoskeletal:        General: No tenderness or deformity.     Cervical back: Neck supple.     Right lower leg: Edema present.     Left lower leg: Edema present.  Skin:    General: Skin is warm and dry.     Findings: No rash.  Neurological:     General: No focal deficit present.     Mental Status: She is alert.     Cranial Nerves: No cranial nerve deficit, dysarthria or facial asymmetry.     Sensory: No sensory deficit.     Motor: No abnormal muscle tone or seizure activity.     Coordination: Coordination normal.  Psychiatric:        Mood and Affect: Mood normal.     ED Results / Procedures / Treatments   Labs (all labs ordered are listed, but only  abnormal results are displayed) Labs Reviewed  CULTURE, BLOOD (ROUTINE X 2)  CULTURE, BLOOD (ROUTINE X 2)  COMPREHENSIVE METABOLIC PANEL  LACTIC ACID, PLASMA  LACTIC ACID, PLASMA  CBC WITH DIFFERENTIAL/PLATELET  PROTIME-INR  URINALYSIS, W/ REFLEX TO CULTURE (INFECTION SUSPECTED)  BLOOD GAS, VENOUS    EKG None  Radiology DG Chest Port 1 View  Result Date: 04/18/2023 CLINICAL DATA:  Dyspnea, lung cancer EXAM: PORTABLE CHEST 1 VIEW COMPARISON:  04/16/2023 FINDINGS: Stable focal consolidation within the right mid lung zone. Stable mild right-sided volume loss. Left lung is clear. No pneumothorax or pleural effusion. Left upper extremity PICC line tip is unchanged within the superior vena cava. Cardiac size within normal limits. Surgical clips again noted within the right axilla. No acute bone abnormality. IMPRESSION: 1. Stable focal consolidation within the right mid lung zone. No radiographic evidence of acute cardiopulmonary disease. Electronically Signed   By: Helyn Numbers M.D.   On: 04/18/2023 22:22    Procedures Procedures  {Document cardiac monitor, telemetry assessment procedure when appropriate:1}  Medications Ordered in ED Medications - No data to display  ED Course/ Medical Decision Making/ A&P Clinical Course as of 04/18/23 2232  Wed Apr 18, 2023  2231 Chest x-ray shows stable focal consolidation within the right midlung zone.  No evidence of acute cardiac or pulmonary process [JK]    Clinical Course User Index [JK] Linwood Dibbles, MD   {   Click here for ABCD2, HEART and other calculatorsREFRESH Note before signing :1}                          Medical Decision Making Amount and/or Complexity of Data Reviewed Labs: ordered.   ***  {Document critical care time when appropriate:1} {Document review of labs and clinical decision tools ie heart score, Chads2Vasc2 etc:1}  {Document your independent review of radiology images, and any outside records:1} {Document your  discussion with family members, caretakers, and with consultants:1} {Document social determinants of health affecting pt's care:1} {Document your decision making why or why not admission, treatments were needed:1} Final Clinical Impression(s) / ED Diagnoses Final diagnoses:  None    Rx / DC Orders ED Discharge Orders     None

## 2023-04-19 ENCOUNTER — Emergency Department (HOSPITAL_COMMUNITY): Payer: No Typology Code available for payment source

## 2023-04-19 ENCOUNTER — Other Ambulatory Visit: Payer: Self-pay

## 2023-04-19 ENCOUNTER — Encounter (HOSPITAL_COMMUNITY): Payer: Self-pay | Admitting: Internal Medicine

## 2023-04-19 DIAGNOSIS — I10 Essential (primary) hypertension: Secondary | ICD-10-CM | POA: Diagnosis not present

## 2023-04-19 DIAGNOSIS — C3490 Malignant neoplasm of unspecified part of unspecified bronchus or lung: Secondary | ICD-10-CM

## 2023-04-19 DIAGNOSIS — J9601 Acute respiratory failure with hypoxia: Secondary | ICD-10-CM | POA: Diagnosis present

## 2023-04-19 DIAGNOSIS — Z7901 Long term (current) use of anticoagulants: Secondary | ICD-10-CM | POA: Diagnosis not present

## 2023-04-19 DIAGNOSIS — N1832 Chronic kidney disease, stage 3b: Secondary | ICD-10-CM | POA: Diagnosis not present

## 2023-04-19 DIAGNOSIS — Z8572 Personal history of non-Hodgkin lymphomas: Secondary | ICD-10-CM | POA: Diagnosis not present

## 2023-04-19 DIAGNOSIS — D638 Anemia in other chronic diseases classified elsewhere: Secondary | ICD-10-CM | POA: Diagnosis not present

## 2023-04-19 DIAGNOSIS — D509 Iron deficiency anemia, unspecified: Secondary | ICD-10-CM | POA: Diagnosis not present

## 2023-04-19 DIAGNOSIS — D6481 Anemia due to antineoplastic chemotherapy: Secondary | ICD-10-CM | POA: Diagnosis not present

## 2023-04-19 DIAGNOSIS — Z1152 Encounter for screening for COVID-19: Secondary | ICD-10-CM | POA: Diagnosis not present

## 2023-04-19 DIAGNOSIS — J189 Pneumonia, unspecified organism: Secondary | ICD-10-CM | POA: Diagnosis present

## 2023-04-19 DIAGNOSIS — R0602 Shortness of breath: Secondary | ICD-10-CM | POA: Diagnosis not present

## 2023-04-19 DIAGNOSIS — Y95 Nosocomial condition: Secondary | ICD-10-CM | POA: Diagnosis present

## 2023-04-19 DIAGNOSIS — E7800 Pure hypercholesterolemia, unspecified: Secondary | ICD-10-CM | POA: Diagnosis not present

## 2023-04-19 DIAGNOSIS — I428 Other cardiomyopathies: Secondary | ICD-10-CM | POA: Diagnosis not present

## 2023-04-19 DIAGNOSIS — N1831 Chronic kidney disease, stage 3a: Secondary | ICD-10-CM | POA: Diagnosis not present

## 2023-04-19 DIAGNOSIS — N179 Acute kidney failure, unspecified: Secondary | ICD-10-CM | POA: Diagnosis not present

## 2023-04-19 DIAGNOSIS — I130 Hypertensive heart and chronic kidney disease with heart failure and stage 1 through stage 4 chronic kidney disease, or unspecified chronic kidney disease: Secondary | ICD-10-CM | POA: Diagnosis not present

## 2023-04-19 DIAGNOSIS — C342 Malignant neoplasm of middle lobe, bronchus or lung: Secondary | ICD-10-CM | POA: Diagnosis not present

## 2023-04-19 DIAGNOSIS — Z86711 Personal history of pulmonary embolism: Secondary | ICD-10-CM | POA: Diagnosis not present

## 2023-04-19 DIAGNOSIS — D631 Anemia in chronic kidney disease: Secondary | ICD-10-CM | POA: Diagnosis not present

## 2023-04-19 DIAGNOSIS — I5022 Chronic systolic (congestive) heart failure: Secondary | ICD-10-CM

## 2023-04-19 DIAGNOSIS — Z8249 Family history of ischemic heart disease and other diseases of the circulatory system: Secondary | ICD-10-CM | POA: Diagnosis not present

## 2023-04-19 DIAGNOSIS — R591 Generalized enlarged lymph nodes: Secondary | ICD-10-CM | POA: Diagnosis not present

## 2023-04-19 DIAGNOSIS — C7951 Secondary malignant neoplasm of bone: Secondary | ICD-10-CM | POA: Diagnosis not present

## 2023-04-19 DIAGNOSIS — I2510 Atherosclerotic heart disease of native coronary artery without angina pectoris: Secondary | ICD-10-CM | POA: Diagnosis not present

## 2023-04-19 DIAGNOSIS — I700 Atherosclerosis of aorta: Secondary | ICD-10-CM | POA: Diagnosis not present

## 2023-04-19 DIAGNOSIS — Z66 Do not resuscitate: Secondary | ICD-10-CM | POA: Diagnosis not present

## 2023-04-19 DIAGNOSIS — J122 Parainfluenza virus pneumonia: Secondary | ICD-10-CM | POA: Diagnosis not present

## 2023-04-19 DIAGNOSIS — Z8673 Personal history of transient ischemic attack (TIA), and cerebral infarction without residual deficits: Secondary | ICD-10-CM | POA: Diagnosis not present

## 2023-04-19 DIAGNOSIS — Z853 Personal history of malignant neoplasm of breast: Secondary | ICD-10-CM | POA: Diagnosis not present

## 2023-04-19 DIAGNOSIS — Z823 Family history of stroke: Secondary | ICD-10-CM | POA: Diagnosis not present

## 2023-04-19 DIAGNOSIS — F411 Generalized anxiety disorder: Secondary | ICD-10-CM | POA: Diagnosis not present

## 2023-04-19 DIAGNOSIS — R8281 Pyuria: Secondary | ICD-10-CM

## 2023-04-19 DIAGNOSIS — E876 Hypokalemia: Secondary | ICD-10-CM | POA: Diagnosis not present

## 2023-04-19 DIAGNOSIS — J910 Malignant pleural effusion: Secondary | ICD-10-CM | POA: Diagnosis not present

## 2023-04-19 LAB — LACTIC ACID, PLASMA: Lactic Acid, Venous: 1.6 mmol/L (ref 0.5–1.9)

## 2023-04-19 LAB — SODIUM, URINE, RANDOM: Sodium, Ur: 18 mmol/L

## 2023-04-19 LAB — MAGNESIUM
Magnesium: 3 mg/dL — ABNORMAL HIGH (ref 1.7–2.4)
Magnesium: 2.2 mg/dL (ref 1.7–2.4)

## 2023-04-19 LAB — MRSA NEXT GEN BY PCR, NASAL: MRSA by PCR Next Gen: NOT DETECTED

## 2023-04-19 LAB — COMPREHENSIVE METABOLIC PANEL
ALT: 14 U/L (ref 0–44)
AST: 17 U/L (ref 15–41)
Albumin: 2.5 g/dL — ABNORMAL LOW (ref 3.5–5.0)
Alkaline Phosphatase: 60 U/L (ref 38–126)
Anion gap: 8 (ref 5–15)
BUN: 19 mg/dL (ref 8–23)
CO2: 25 mmol/L (ref 22–32)
Calcium: 7.9 mg/dL — ABNORMAL LOW (ref 8.9–10.3)
Chloride: 104 mmol/L (ref 98–111)
Creatinine, Ser: 1.35 mg/dL — ABNORMAL HIGH (ref 0.44–1.00)
GFR, Estimated: 40 mL/min — ABNORMAL LOW (ref >60)
Glucose, Bld: 197 mg/dL — ABNORMAL HIGH (ref 70–99)
Potassium: 3.5 mmol/L (ref 3.5–5.1)
Sodium: 137 mmol/L (ref 135–145)
Total Bilirubin: 0.4 mg/dL (ref 0.3–1.2)
Total Protein: 6.4 g/dL — ABNORMAL LOW (ref 6.5–8.1)

## 2023-04-19 LAB — RESPIRATORY PANEL BY PCR

## 2023-04-19 LAB — CBC WITH DIFFERENTIAL/PLATELET
Abs Immature Granulocytes: 0.02 10*3/uL (ref 0.00–0.07)
Basophils Absolute: 0 10*3/uL (ref 0.0–0.1)
Basophils Relative: 0 %
Eosinophils Absolute: 0 10*3/uL (ref 0.0–0.5)
Eosinophils Relative: 0 %
HCT: 29.3 % — ABNORMAL LOW (ref 36.0–46.0)
Hemoglobin: 8.8 g/dL — ABNORMAL LOW (ref 12.0–15.0)
Immature Granulocytes: 1 %
Lymphocytes Relative: 19 %
Lymphs Abs: 0.8 10*3/uL (ref 0.7–4.0)
MCH: 31.3 pg (ref 26.0–34.0)
MCHC: 30 g/dL (ref 30.0–36.0)
MCV: 104.3 fL — ABNORMAL HIGH (ref 80.0–100.0)
Monocytes Absolute: 0.1 10*3/uL (ref 0.1–1.0)
Monocytes Relative: 1 %
Neutro Abs: 3.2 10*3/uL (ref 1.7–7.7)
Neutrophils Relative %: 79 %
Platelets: 140 10*3/uL — ABNORMAL LOW (ref 150–400)
RBC: 2.81 MIL/uL — ABNORMAL LOW (ref 3.87–5.11)
RDW: 16.5 % — ABNORMAL HIGH (ref 11.5–15.5)
WBC: 4 10*3/uL (ref 4.0–10.5)
nRBC: 0 % (ref 0.0–0.2)

## 2023-04-19 LAB — SARS CORONAVIRUS 2 BY RT PCR: SARS Coronavirus 2 by RT PCR: NEGATIVE

## 2023-04-19 LAB — URINALYSIS, W/ REFLEX TO CULTURE (INFECTION SUSPECTED)
Bilirubin Urine: NEGATIVE
Glucose, UA: NEGATIVE mg/dL
Hgb urine dipstick: NEGATIVE
Ketones, ur: NEGATIVE mg/dL
Nitrite: NEGATIVE
Protein, ur: NEGATIVE mg/dL
Specific Gravity, Urine: 1.03 (ref 1.005–1.030)
pH: 5 (ref 5.0–8.0)

## 2023-04-19 LAB — PROCALCITONIN: Procalcitonin: <0.1 ng/mL

## 2023-04-19 LAB — PHOSPHORUS: Phosphorus: 3.6 mg/dL (ref 2.5–4.6)

## 2023-04-19 LAB — CREATININE, URINE, RANDOM: Creatinine, Urine: 156 mg/dL

## 2023-04-19 LAB — STREP PNEUMONIAE URINARY ANTIGEN: Strep Pneumo Urinary Antigen: NEGATIVE

## 2023-04-19 MED ORDER — LORAZEPAM 0.5 MG PO TABS
0.5000 mg | ORAL_TABLET | Freq: Two times a day (BID) | ORAL | Status: DC | PRN
  Administered 2023-04-19 – 2023-04-22 (×4): 0.5 mg via ORAL
  Filled 2023-04-19 (×4): qty 1

## 2023-04-19 MED ORDER — AZITHROMYCIN 500 MG PO TABS
500.0000 mg | ORAL_TABLET | Freq: Every day | ORAL | Status: AC
  Administered 2023-04-19 – 2023-04-23 (×5): 500 mg via ORAL
  Filled 2023-04-19 (×5): qty 2

## 2023-04-19 MED ORDER — SODIUM CHLORIDE 0.9% FLUSH
10.0000 mL | Freq: Two times a day (BID) | INTRAVENOUS | Status: DC
  Administered 2023-04-19 – 2023-04-21 (×3): 10 mL

## 2023-04-19 MED ORDER — ACETAMINOPHEN 650 MG RE SUPP: 650.0000 mg | Freq: Four times a day (QID) | RECTAL | Status: DC | PRN

## 2023-04-19 MED ORDER — BENZONATATE 100 MG PO CAPS: 200.0000 mg | ORAL_CAPSULE | Freq: Three times a day (TID) | ORAL | Status: DC | PRN

## 2023-04-19 MED ORDER — FERROUS SULFATE 325 (65 FE) MG PO TABS
325.0000 mg | ORAL_TABLET | Freq: Every day | ORAL | Status: DC
  Administered 2023-04-19 – 2023-04-21 (×3): 325 mg via ORAL
  Filled 2023-04-19 (×3): qty 1

## 2023-04-19 MED ORDER — SODIUM CHLORIDE 0.9 % IV BOLUS (SEPSIS)
500.0000 mL | Freq: Once | INTRAVENOUS | Status: AC
  Administered 2023-04-19: 500 mL via INTRAVENOUS

## 2023-04-19 MED ORDER — MELATONIN 3 MG PO TABS
3.0000 mg | ORAL_TABLET | Freq: Every evening | ORAL | Status: DC | PRN
  Administered 2023-04-19 – 2023-04-21 (×3): 3 mg via ORAL
  Filled 2023-04-19 (×3): qty 1

## 2023-04-19 MED ORDER — SODIUM CHLORIDE 0.9 % IV SOLN: INTRAVENOUS | Status: DC

## 2023-04-19 MED ORDER — SODIUM CHLORIDE 0.9 % IV SOLN
2.0000 g | INTRAVENOUS | Status: DC
  Administered 2023-04-20 – 2023-04-23 (×4): 2 g via INTRAVENOUS
  Filled 2023-04-19 (×4): qty 20

## 2023-04-19 MED ORDER — HYDROCODONE BIT-HOMATROP MBR 5-1.5 MG/5ML PO SOLN
5.0000 mL | Freq: Four times a day (QID) | ORAL | Status: DC | PRN
  Administered 2023-04-19 – 2023-04-23 (×9): 5 mL via ORAL
  Filled 2023-04-19 (×9): qty 5

## 2023-04-19 MED ORDER — ACETAMINOPHEN 325 MG PO TABS: 650.0000 mg | ORAL_TABLET | Freq: Four times a day (QID) | ORAL | Status: DC | PRN

## 2023-04-19 MED ORDER — SODIUM CHLORIDE 0.9 % IV SOLN
2.0000 g | Freq: Once | INTRAVENOUS | Status: AC
  Administered 2023-04-19: 2 g via INTRAVENOUS
  Filled 2023-04-19: qty 12.5

## 2023-04-19 MED ORDER — ALBUTEROL SULFATE (2.5 MG/3ML) 0.083% IN NEBU
2.5000 mg | INHALATION_SOLUTION | RESPIRATORY_TRACT | Status: DC | PRN
  Administered 2023-04-20 – 2023-04-21 (×2): 2.5 mg via RESPIRATORY_TRACT
  Filled 2023-04-19 (×2): qty 3

## 2023-04-19 MED ORDER — PREDNISONE 20 MG PO TABS
40.0000 mg | ORAL_TABLET | Freq: Every day | ORAL | Status: DC
  Administered 2023-04-19 – 2023-04-23 (×5): 40 mg via ORAL
  Filled 2023-04-19 (×5): qty 2

## 2023-04-19 MED ORDER — CHLORHEXIDINE GLUCONATE CLOTH 2 % EX PADS
6.0000 | MEDICATED_PAD | Freq: Every day | CUTANEOUS | Status: DC
  Administered 2023-04-19 – 2023-04-22 (×4): 6 via TOPICAL

## 2023-04-19 MED ORDER — VANCOMYCIN HCL IN DEXTROSE 1-5 GM/200ML-% IV SOLN
1000.0000 mg | Freq: Once | INTRAVENOUS | Status: AC
  Administered 2023-04-19: 1000 mg via INTRAVENOUS
  Filled 2023-04-19: qty 200

## 2023-04-19 MED ORDER — METOPROLOL TARTRATE 25 MG PO TABS
50.0000 mg | ORAL_TABLET | Freq: Three times a day (TID) | ORAL | Status: DC
  Administered 2023-04-19 – 2023-04-23 (×13): 50 mg via ORAL
  Filled 2023-04-19 (×13): qty 2

## 2023-04-19 MED ORDER — VANCOMYCIN HCL IN DEXTROSE 1-5 GM/200ML-% IV SOLN: 1000.0000 mg | INTRAVENOUS | Status: DC

## 2023-04-19 MED ORDER — IPRATROPIUM-ALBUTEROL 0.5-2.5 (3) MG/3ML IN SOLN
3.0000 mL | Freq: Four times a day (QID) | RESPIRATORY_TRACT | Status: DC
  Administered 2023-04-19 – 2023-04-20 (×6): 3 mL via RESPIRATORY_TRACT
  Filled 2023-04-19 (×6): qty 3

## 2023-04-19 MED ORDER — ONDANSETRON HCL 4 MG/2ML IJ SOLN: 4.0000 mg | Freq: Four times a day (QID) | INTRAMUSCULAR | Status: DC | PRN

## 2023-04-19 MED ORDER — IOHEXOL 350 MG/ML SOLN
80.0000 mL | Freq: Once | INTRAVENOUS | Status: AC | PRN
  Administered 2023-04-19: 80 mL via INTRAVENOUS

## 2023-04-19 MED ORDER — SODIUM CHLORIDE 0.9 % IV SOLN: 1000.0000 mL | INTRAVENOUS | Status: DC

## 2023-04-19 MED ORDER — SODIUM CHLORIDE 0.9 % IV SOLN
2.0000 g | Freq: Two times a day (BID) | INTRAVENOUS | Status: DC
  Administered 2023-04-19: 2 g via INTRAVENOUS
  Filled 2023-04-19 (×2): qty 12.5

## 2023-04-19 MED ORDER — LEVALBUTEROL HCL 1.25 MG/0.5ML IN NEBU
1.2500 mg | INHALATION_SOLUTION | RESPIRATORY_TRACT | Status: DC | PRN
  Administered 2023-04-19: 1.25 mg via RESPIRATORY_TRACT
  Filled 2023-04-19: qty 0.5

## 2023-04-19 MED ORDER — FOLIC ACID 1 MG PO TABS
1.0000 mg | ORAL_TABLET | Freq: Every day | ORAL | Status: DC
  Administered 2023-04-19 – 2023-04-23 (×5): 1 mg via ORAL
  Filled 2023-04-19 (×5): qty 1

## 2023-04-19 MED ORDER — SODIUM CHLORIDE 0.9% FLUSH: 10.0000 mL | INTRAVENOUS | Status: DC | PRN

## 2023-04-19 MED ORDER — APIXABAN 5 MG PO TABS
5.0000 mg | ORAL_TABLET | Freq: Two times a day (BID) | ORAL | Status: DC
  Administered 2023-04-19 – 2023-04-23 (×9): 5 mg via ORAL
  Filled 2023-04-19 (×9): qty 1

## 2023-04-19 NOTE — Progress Notes (Signed)
   04/19/23 0334  Chest Physiotherapy Tx  $ Expiratory Vibration Device Administration  Initial  $ Flutter Blue Yes  CPT Duration 5 (Pt demonstrated good understanding of flutter valve and technique of usage but was only able to perform 5 breaths before having coughing spell and stopping.)  CPT Chest Site Full range  Post-Treatment Respirations 21  Cough Congested;Weak  Position Supine  CPT Treatment Tolerance Tolerated poorly (only able to perform five breaths of flutter valve before having coughing spell)

## 2023-04-19 NOTE — ED Notes (Signed)
[name] Roberts(Pt's Niece/HPOA) called to get an update on Pt. This RN verified with Pt if it was ok to update her niece on her care and Pt states that it was.

## 2023-04-19 NOTE — Progress Notes (Signed)
Pharmacy Antibiotic Note  Crystal [name] is a 77 y.o. female admitted on 04/18/2023 with history of nonischemic cardiomyopathy, hypertension, non-Hodgkin's lymphoma, hypokalemia, pulmonary embolism, metastatic non-small cell cancer, malignant pleural effusion, chronic kidney disease who presents to the ER for evaluation of shortness of breath. .  Pharmacy has been consulted to dose vancomycin and cefepime for HCAP.  Plan: Vancomycin 1gm IV x 1 then 1gm q36h (AUC 509.9, Scr 1.41) Cefepime 2gm IV q12h Follow renal function, cultures and clinical course    Temp (24hrs), Avg:99.9 F (37.7 C), Min:99.9 F (37.7 C), Max:99.9 F (37.7 C)  Recent Labs  Lab 04/18/23 2200  WBC 9.3  CREATININE 1.41*  LATICACIDVEN 1.6    Estimated Creatinine Clearance: 36.5 mL/min (A) (by C-G formula based on SCr of 1.41 mg/dL (H)).    Allergies  Allergen Reactions   Simvastatin Other (See Comments)    Leg cramps   Zoloft [Sertraline] Nausea Only   Codeine Other (See Comments)    Bad Headaches   Coreg Other (See Comments)    "Made my legs hurt"   Lisinopril Cough   Tizanidine Hcl Rash and Other (See Comments)    Hypotension, also    Antimicrobials this admission: 6/27 vanc >> 6/27 cefepime >>  Dose adjustments this admission:   Microbiology results: 6/27 BCx:  6/27 UCx:   Thank you for allowing pharmacy to be a part of this patient's care.  Arley Phenix RPh 04/19/2023, 2:02 AM

## 2023-04-19 NOTE — Progress Notes (Signed)
   04/19/23 0015  Therapy Vitals  Pulse Rate (!) 106  Resp (!) 21  MEWS Score/Color  MEWS Score 3  MEWS Score Color Yellow  Respiratory Assessment  Assessment Type Assess only  Respiratory Pattern Regular;Unlabored;Symmetrical  Chest Assessment Chest expansion symmetrical  Cough Strong;Congested  Bilateral Breath Sounds Expiratory wheezes;Rhonchi;Diminished  Oxygen Therapy/Pulse Ox  O2 Device Nasal Cannula (per Pt request and MD/RT approval, patient transitioned to nasal cannula)  O2 Therapy Oxygen  O2 Flow Rate (L/min) 6 L/min  SpO2 98 %   Patient comfortable and tolerating nasal cannula well at this time.
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
## 2023-04-19 NOTE — Progress Notes (Addendum)
PROGRESS NOTE    Crystal [name]  NWG:[id number] DOB: 06-14-1946 DOA: 04/18/2023 PCP: Blair Heys, MD  Chief Complaint  Patient presents with   Shortness of Breath    Brief Narrative:   Crystal [name] is Crystal [name] 77 y.o. female with medical history significant for metastatic non-small cell lung cancer, essential pretension, anemia of chronic disease associated baseline hemoglobin 8-10, MSSA bacteremia in March 2024, pulmonary embolism, CKD 3 Crystal [name] with baseline creatinine 1.1, chronic systolic heart failure, who is admitted to [facility] on 04/18/2023 with HCAP pneumonia after presenting from home to [facility] ED complaining of shortness of breath.    Assessment & Plan:   Principal Problem:   HCAP (healthcare-associated pneumonia) Active Problems:   Essential hypertension   Metastatic non-small cell lung cancer (HCC)   Chronic systolic CHF (congestive heart failure) (HCC)   Acute hypoxic respiratory failure (HCC)   Acute renal failure superimposed on stage 3a chronic kidney disease (HCC)   Sterile pyuria   Anemia of chronic disease   History of pulmonary embolism  Acute Hypoxic Respiratory Failure  Community Acquired Pneumonia Required 4-6 L overnight Recently on RA CT chest with diffuse peribronchovascular tree in bud nodularity and diffuse bronchial wall thickening and debris within the trachea Negative MRSA pcr, negative urine strep Pending blood cultures, urine legionella, sputum culture Negative covid Pending RVP Narrow abx to ceftriaxone/azithromycin Add prednisone given wheezing on exam  SLP eval with findings concerning for aspiration  AKI on CKD IIIb Mild, follow  Metastatic Non Small Cell Lung Cancer CT with stable 3.8x2.9 cm right middle lobe central mass, interval increase in size of mediastinal LAD, stable R hilar LAD Follows with Dr. Truett Perna outpatient - being treated with atezolizumab  Asymptomatic Pyuria No si/sx UTI   HTN Hold metoprolol for  now  Hx PE Continue eliquis  HFmrEF Appears euvolemic     DVT prophylaxis: eliquis Code Status: DNR/DNI Family Communication: niece, April Disposition:   Status is: Inpatient Remains inpatient appropriate because: continued need for inpatient care, hypoxia   Consultants:  none  Procedures:  none  Antimicrobials:  Anti-infectives (From admission, onward)    Start     Dose/Rate Route Frequency Ordered Stop   04/20/23 1400  vancomycin (VANCOCIN) IVPB 1000 mg/200 mL premix        1,000 mg 200 mL/hr over 60 Minutes Intravenous Every 36 hours 04/19/23 0255     04/19/23 1400  ceFEPIme (MAXIPIME) 2 g in sodium chloride 0.9 % 100 mL IVPB        2 g 200 mL/hr over 30 Minutes Intravenous Every 12 hours 04/19/23 0255     04/19/23 0215  ceFEPIme (MAXIPIME) 2 g in sodium chloride 0.9 % 100 mL IVPB        2 g 200 mL/hr over 30 Minutes Intravenous  Once 04/19/23 0200 04/19/23 0336   04/19/23 0215  vancomycin (VANCOCIN) IVPB 1000 mg/200 mL premix        1,000 mg 200 mL/hr over 60 Minutes Intravenous  Once 04/19/23 0200 04/19/23 0336       Subjective: No complaints, feels better  Objective: Vitals:   04/19/23 0407 04/19/23 0753 04/19/23 0854 04/19/23 1236  BP: 120/79 125/75  121/80  Pulse: (!) 102 (!) 101  94  Resp: 20 (!) 24  (!) 24  Temp: 97.8 F (36.6 C) 97.9 F (36.6 C)  97.9 F (36.6 C)  TempSrc: Oral Oral  Oral  SpO2: 92% 98% 94% (!) 85%  Weight: 87.1 kg  Height: 5\' 5"  (1.651 m)       Intake/Output Summary (Last 24 hours) at 04/19/2023 1426 Last data filed at 04/19/2023 1209 Gross per 24 hour  Intake 1971.23 ml  Output --  Net 1971.23 ml   Filed Weights   04/19/23 0407  Weight: 87.1 kg    Examination:  General exam: Appears calm and comfortable  Respiratory system: diminished, some wheezing appreciated on exam bilaterally Cardiovascular system: RRR Gastrointestinal system: Abdomen is nondistended, soft and nontender. No organomegaly or masses felt.  Normal bowel sounds heard. Central nervous system: Alert and oriented. No focal neurological deficits. Extremities: no LEE   Data Reviewed: I have personally reviewed following labs and imaging studies  CBC: Recent Labs  Lab 04/18/23 2200 04/19/23 0541  WBC 9.3 4.0  NEUTROABS 3.9 3.2  HGB 10.1* 8.8*  HCT 33.3* 29.3*  MCV 104.4* 104.3*  PLT 184 140*    Basic Metabolic Panel: Recent Labs  Lab 04/18/23 2200 04/19/23 0541  NA 138 137  K 3.7 3.5  CL 104 104  CO2 26 25  GLUCOSE 158* 197*  BUN 18 19  CREATININE 1.41* 1.35*  CALCIUM 8.2* 7.9*  MG 3.0* 2.2  PHOS  --  3.6    GFR: Estimated Creatinine Clearance: 37.4 mL/min (Crystal [name]) (by C-G formula based on SCr of 1.35 mg/dL (H)).  Liver Function Tests: Recent Labs  Lab 04/18/23 2200 04/19/23 0541  AST 22 17  ALT 14 14  ALKPHOS 74 60  BILITOT 0.5 0.4  PROT 7.3 6.4*  ALBUMIN 2.9* 2.5*    CBG: No results for input(s): "GLUCAP" in the last 168 hours.   Recent Results (from the past 240 hour(s))  Culture, blood (Routine x 2)     Status: None (Preliminary result)   Collection Time: 04/18/23 10:27 PM   Specimen: BLOOD  Result Value Ref Range Status   Specimen Description   Final    BLOOD SITE NOT SPECIFIED Performed at [facility], [address] [place], [place] [address]    Special Requests   Final    BOTTLES DRAWN AEROBIC AND ANAEROBIC Blood Culture adequate volume Performed at [facility], [address] [place], [place] [address]    Culture   Final    NO GROWTH < 12 HOURS Performed at [facility], [address] [place], [place] [address]    Report Status PENDING  Incomplete  Culture, blood (Routine x 2)     Status: None (Preliminary result)   Collection Time: 04/18/23 10:45 PM   Specimen: BLOOD  Result Value Ref Range Status   Specimen Description   Final    BLOOD SITE NOT SPECIFIED Performed at [facility], [address],  [place], [place] [address]    Special Requests   Final    BOTTLES DRAWN AEROBIC AND ANAEROBIC Blood Culture adequate volume Performed at [facility], [address], [place], [place] [address]    Culture   Final    NO GROWTH < 12 HOURS Performed at [facility], [address] [place], [place] [address]    Report Status PENDING  Incomplete  MRSA Next Gen by PCR, Nasal     Status: None   Collection Time: 04/19/23  4:43 AM   Specimen: Nasal Mucosa; Nasal Swab  Result Value Ref Range Status   MRSA by PCR Next Gen NOT DETECTED NOT DETECTED Final    Comment: (NOTE) The GeneXpert MRSA Assay (FDA  approved for NASAL specimens only), is one component of Ruford Dudzinski comprehensive MRSA colonization surveillance program. It is not intended to diagnose MRSA infection nor to guide or monitor treatment for MRSA infections. Test performance is not FDA approved in patients less than 24 years old. Performed at [facility], [address], [place], [place] [address]   SARS Coronavirus 2 by RT PCR (hospital order, performed in [facility] hospital lab) *cepheid single result test* Anterior Nasal Swab     Status: None   Collection Time: 04/19/23  1:06 PM   Specimen: Anterior Nasal Swab  Result Value Ref Range Status   SARS Coronavirus 2 by RT PCR NEGATIVE NEGATIVE Final    Comment: (NOTE) SARS-CoV-2 target nucleic acids are NOT DETECTED.  The SARS-CoV-2 RNA is generally detectable in upper and lower respiratory specimens during the acute phase of infection. The lowest concentration of SARS-CoV-2 viral copies this assay can detect is 250 copies / mL. Crystal [name] negative result does not preclude SARS-CoV-2 infection and should not be used as the sole basis for treatment or other patient management decisions.  Crystal [name] negative result may occur with improper specimen collection / handling, submission of specimen other than nasopharyngeal swab, presence of viral mutation(s) within  the areas targeted by this assay, and inadequate number of viral copies (<250 copies / mL). Crystal [name] negative result must be combined with clinical observations, patient history, and epidemiological information.  Fact Sheet for Patients:   RoadLapTop.co.za  Fact Sheet for Healthcare Providers: http://kim-miller.com/  This test is not yet approved or  cleared by the [place] FDA and has been authorized for detection and/or diagnosis of SARS-CoV-2 by FDA under an Emergency Use Authorization (EUA).  This EUA will remain in effect (meaning this test can be used) for the duration of the COVID-19 declaration under Section 564(b)(1) of the Act, 21 U.S.C. section 360bbb-3(b)(1), unless the authorization is terminated or revoked sooner.  Performed at [facility], [address] [place], [place] [address]          Radiology Studies: CT Angio Chest PE W and/or Wo Contrast  Result Date: 04/19/2023 CLINICAL DATA:  Pulmonary embolism (PE) suspected, low to intermediate prob, positive D-dimer Pt has stage 4 lung cancer. Pt states hx of clots; takes eliquis EXAM: CT ANGIOGRAPHY CHEST WITH CONTRAST TECHNIQUE: Multidetector CT imaging of the chest was performed using the standard protocol during bolus administration of intravenous contrast. Multiplanar CT image reconstructions and MIPs were obtained to evaluate the vascular anatomy. RADIATION DOSE REDUCTION: This exam was performed according to the departmental dose-optimization program which includes automated exposure control, adjustment of the mA and/or kV according to patient size and/or use of iterative reconstruction technique. CONTRAST:  80mL OMNIPAQUE IOHEXOL 350 MG/ML SOLN COMPARISON:  CT angio chest 03/05/2023 FINDINGS: Cardiovascular: Satisfactory opacification of the pulmonary arteries to the segmental level. No evidence of pulmonary embolism. Normal heart size. No significant  pericardial effusion. The thoracic aorta is normal in caliber. Mild atherosclerotic plaque of the thoracic aorta. At least 3 vessel coronary artery calcifications. Mediastinum/Nodes: Stable 1 cm right hilar lymph node. Interval increase in size of Crystal [name] 1.2 cm (from 0.9 cm ) subcarinal lymph node. Interval increase in size of prominent but nonenlarged mediastinal lymph nodes. No axillary lymph nodes. Debris within the trachea. Thyroid gland, trachea, and esophagus demonstrate no significant findings. Lungs/Pleura: Diffuse bronchial wall thickening. Interval development of diffuse peribronchovascular tree-in-bud nodularity most prominent along the left upper lobe. Grossly stable 3.8 x 2.9  cm right middle lobe central mass. No pulmonary nodule. No pulmonary mass. No pleural effusion. No pneumothorax. Upper Abdomen: No acute abnormality. Musculoskeletal: No chest wall abnormality. Surgical changes along the right breast. Redemonstration of right upper breast 1.3 x 1 cm fluid dense lesion (4:23). No suspicious lytic or blastic osseous lesions. Chronic left lateral ninth rib fracture. Multilevel degenerative changes of the spine. Review of the MIP images confirms the above findings. IMPRESSION: 1. No pulmonary embolus. 2. Interval development of diffuse peribronchovascular tree-in-bud nodularity most prominent along the left upper lobe. Finding consistent with infection. 3. Diffuse bronchial wall thickening and debris within the trachea. 4. Grossly stable 3.8 x 2.9 cm right middle lobe central mass. 5. Interval increase in size of mediastinal lymphadenopathy. Stable right hilar lymphadenopathy. Electronically Signed   By: Tish Frederickson M.D.   On: 04/19/2023 01:02   DG Chest Port 1 View  Result Date: 04/18/2023 CLINICAL DATA:  Dyspnea, lung cancer EXAM: PORTABLE CHEST 1 VIEW COMPARISON:  04/16/2023 FINDINGS: Stable focal consolidation within the right mid lung zone. Stable mild right-sided volume loss. Left lung is  clear. No pneumothorax or pleural effusion. Left upper extremity PICC line tip is unchanged within the superior vena cava. Cardiac size within normal limits. Surgical clips again noted within the right axilla. No acute bone abnormality. IMPRESSION: 1. Stable focal consolidation within the right mid lung zone. No radiographic evidence of acute cardiopulmonary disease. Electronically Signed   By: Helyn Numbers M.D.   On: 04/18/2023 22:22        Scheduled Meds:  apixaban  5 mg Oral BID   Chlorhexidine Gluconate Cloth  6 each Topical Daily   ferrous sulfate  325 mg Oral Q lunch   folic acid  1 mg Oral Daily   ipratropium-albuterol  3 mL Nebulization Q6H WA   metoprolol tartrate  50 mg Oral TID   predniSONE  40 mg Oral Q breakfast   sodium chloride flush  10-40 mL Intracatheter Q12H   Continuous Infusions:  sodium chloride     sodium chloride 125 mL/hr at 04/19/23 1052   ceFEPime (MAXIPIME) IV     [START ON 04/20/2023] vancomycin       LOS: 0 days    Time spent: over 30 min    Lacretia Nicks, MD [facility]   To contact the attending provider between 7A-7P or the covering provider during after hours 7P-7A, please log into the web site [email or web address] and access using universal [place] password for that web site. If you do not have the password, please call the hospital operator.  04/19/2023, 2:26 PM

## 2023-04-19 NOTE — ED Notes (Signed)
Pt's niece Aundra Millet

## 2023-04-19 NOTE — Progress Notes (Signed)
   04/19/23 0753  Vitals  Temp 97.9 F (36.6 C)  Temp Source Oral  BP 125/75  MAP (mmHg) 91  BP Location Right Arm  BP Method Automatic  Patient Position (if appropriate) Lying  Pulse Rate (!) 101  Pulse Rate Source Monitor  Resp (!) 24  Level of Consciousness  Level of Consciousness Alert  MEWS COLOR  MEWS Score Color Yellow  Oxygen Therapy  SpO2 98 %  O2 Device Nasal Cannula  O2 Flow Rate (L/min) 6 L/min  Pain Assessment  Pain Scale 0-10  Pain Score 0  MEWS Score  MEWS Temp 0  MEWS Systolic 0  MEWS Pulse 1  MEWS RR 1  MEWS LOC 0  MEWS Score 2  Provider Notification  Provider Name/Title MD Lowell Guitar  Date Provider Notified 04/19/23  Time Provider Notified [phone]  Method of Notification Page  Notification Reason Other (Comment) (Pt yellow mews)  Provider response No new orders  Date of Provider Response 04/19/23  Time of Provider Response 0920    MD Lowell Guitar made aware of above. Pt stable, this RN able to titrate O2 down. Pt currently on 4L at 94%.

## 2023-04-19 NOTE — Progress Notes (Signed)
Sputum specimen cup left @ bedside with pt label and instructions to pt.

## 2023-04-19 NOTE — H&P (Signed)
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 decompensated heart failure at this time. home diuretic regimen reportedly consists of the following: None.  Will closely monitor for ensuing development of volume overload given plan to pursue gentle IV fluids overnight.  Plan: monitor strict I's & O's and daily weights. Repeat BMP in AM. Check serum mag  level.         DVT prophylaxis: SCD's + home Eliquis Code Status: DNR (consistent with CODE STATUS documentation from most recent prior hospitalizations as well as active DNR paperwork on file, and confirmed by the patient). Family Communication: none Disposition Plan: Per Rounding Team Consults called: none;  Admission status: Inpatient    I SPENT GREATER THAN 75  MINUTES IN CLINICAL CARE TIME/MEDICAL DECISION-MAKING IN COMPLETING THIS ADMISSION.     Chaney Born Kylil Swopes DO [facility] From 7PM - 7AM   04/19/2023, 3:25 AM

## 2023-04-20 DIAGNOSIS — F411 Generalized anxiety disorder: Secondary | ICD-10-CM | POA: Diagnosis not present

## 2023-04-20 DIAGNOSIS — J189 Pneumonia, unspecified organism: Secondary | ICD-10-CM

## 2023-04-20 DIAGNOSIS — D509 Iron deficiency anemia, unspecified: Secondary | ICD-10-CM | POA: Diagnosis not present

## 2023-04-20 LAB — URINE CULTURE

## 2023-04-20 LAB — CULTURE, BLOOD (ROUTINE X 2): Culture: NO GROWTH

## 2023-04-20 MED ORDER — IPRATROPIUM-ALBUTEROL 0.5-2.5 (3) MG/3ML IN SOLN
3.0000 mL | Freq: Three times a day (TID) | RESPIRATORY_TRACT | Status: DC
  Administered 2023-04-21 – 2023-04-23 (×7): 3 mL via RESPIRATORY_TRACT
  Filled 2023-04-20 (×7): qty 3

## 2023-04-20 NOTE — TOC Initial Note (Signed)
Transition of Care [facility]) - Initial/Assessment Note    Patient Details  Name: Crystal [name] MRN: [id number] Date of Birth: 1946-07-02  Transition of Care [facility]) CM/SW Contact:    Coralyn Helling, LCSW Phone Number: 04/20/2023, 12:44 PM  Clinical Narrative:         TOC spoke with sister, Erskine Squibb at bedside. Patient from [facility]. Plan to return at dc. Patient has PRN o2 at home and wheel chair at baseline.   PLAN: Return to ALF. TOC to follow for dc needs.           Expected Discharge Plan: Assisted Living Barriers to Discharge: Continued Medical Work up   Patient Goals and CMS Choice Patient states their goals for this hospitalization and ongoing recovery are:: Return home   Choice offered to / list presented to : NA      Expected Discharge Plan and Services In-house Referral: NA Discharge Planning Services: NA Post Acute Care Choice: NA Living arrangements for the past 2 months: Assisted Living Facility [facility])                 DME Arranged: N/A DME Agency: NA       HH Arranged: NA HH Agency: NA        Prior Living Arrangements/Services Living arrangements for the past 2 months: Assisted Living Facility ([facility]) Lives with:: Facility Resident Patient language and need for interpreter reviewed:: Yes Do you feel safe going back to the place where you live?: Yes      Need for Family Participation in Patient Care: No (Comment) Care giver support system in place?: Yes (comment) Current home services: Other (comment) (o2 PRN) Criminal Activity/Legal Involvement Pertinent to Current Situation/Hospitalization: No - Comment as needed  Activities of Daily Living Home Assistive Devices/Equipment: Eyeglasses, Environmental consultant (specify type), Wheelchair ADL Screening (condition at time of admission) Patient's cognitive ability adequate to safely complete daily activities?: Yes Is the patient deaf or have difficulty hearing?: No Does the patient have difficulty seeing,  even when wearing glasses/contacts?: No Does the patient have difficulty concentrating, remembering, or making decisions?: No Patient able to express need for assistance with ADLs?: Yes Does the patient have difficulty dressing or bathing?: No Independently performs ADLs?: Yes (appropriate for developmental age) Does the patient have difficulty walking or climbing stairs?: No Weakness of Legs: Both Weakness of Arms/Hands: None  Permission Sought/Granted Permission sought to share information with : Facility Medical sales representative, Family Supports Permission granted to share information with : Yes, Verbal Permission Granted  Share Information with NAME: Erskine Squibb  Permission granted to share info w AGENCY: [facility]  Permission granted to share info w Relationship: Sister     Emotional Assessment Appearance:: Appears stated age Attitude/Demeanor/Rapport: Engaged Affect (typically observed): Appropriate Orientation: : Oriented to Self, Oriented to Place, Oriented to  Time, Oriented to Situation Alcohol / Substance Use: Not Applicable Psych Involvement: No (comment)  Admission diagnosis:  Shortness of breath [R06.02] Acute hypoxic respiratory failure (HCC) [J96.01] Patient Active Problem List   Diagnosis Date Noted   Acute hypoxic respiratory failure (HCC) 04/19/2023   HCAP (healthcare-associated pneumonia) 04/19/2023   Acute renal failure superimposed on stage 3a chronic kidney disease (HCC) 04/19/2023   Sterile pyuria 04/19/2023   Anemia of chronic disease 04/19/2023   History of pulmonary embolism 04/19/2023   Debility 03/21/2023   Postural dizziness with presyncope 03/07/2023   Frequent PVCs 03/07/2023   Thrombocytopenia (HCC) 03/07/2023   Anemia associated with chemotherapy 03/07/2023  UTI (urinary tract infection) 03/07/2023   Sinus tachycardia 03/06/2023   History of DVT (deep vein thrombosis) 03/06/2023   DNR (do not resuscitate)/DNI(Do Not Intubate) 03/06/2023    Pleural effusion, malignant 01/13/2023   Neutropenia (HCC) 12/08/2022   Recurrent right pleural effusion 11/16/2022   Acute pulmonary embolism (HCC) 11/12/2022   Acute hypoxemic respiratory failure (HCC) 11/12/2022   Metastatic non-small cell lung cancer (HCC) 11/12/2022   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   Rib fracture 11/12/2022   Anxiety 11/12/2022   CKD stage 3a, GFR 45-59 ml/min (HCC) - baseline SCr 0.9-1.1 11/12/2022   Chronic systolic CHF (congestive heart failure) (HCC) 11/12/2022   Metastasis to spinal column (HCC) 10/03/2022   Primary cancer of right middle lobe of lung (HCC) 09/29/2022   Intracranial bleed (HCC) 09/13/2022   Lung mass 09/13/2022   Hypokalemia 09/13/2022   Tachycardia 08/29/2021   Preop cardiovascular exam 08/29/2020   Symptomatic cholelithiasis 06/09/2020   S/P laparoscopic cholecystectomy 06/09/2020   Chest pain with moderate risk for cardiac etiology 02/14/2018   Right upper quadrant pain 02/12/2018   Tremor 02/11/2014   Non Hodgkin's lymphoma (HCC) 08/18/2013   Neck pain on left side 05/30/2013   Dizziness 05/30/2013   Moderate obesity 05/21/2013   Lung crackles 05/21/2013   Dyspnea on exertion 05/21/2013   Nonischemic cardiomyopathy (HCC)    Essential hypertension    Dyslipidemia, goal LDL below 100    PCP:  Blair Heys, MD Pharmacy:   [id number] [place], [place] - [address] AT [facility] & [address] [place] [address] Phone: [phone] Fax: [phone]     Social Determinants of Health (SDOH) Social History: SDOH Screenings   Food Insecurity: No Food Insecurity (04/19/2023)  Housing: Low Risk  (04/19/2023)  Transportation Needs: No Transportation Needs (04/19/2023)  Utilities: Not At Risk (04/19/2023)  Depression (PHQ2-9): Low Risk  (03/02/2023)  Tobacco Use: Low Risk  (04/19/2023)   SDOH Interventions:     Readmission Risk Interventions    03/09/2023    12:06 PM  Readmission Risk Prevention Plan  Transportation Screening Complete  Medication Review (RN Care Manager) Complete  PCP or Specialist appointment within 3-5 days of discharge Complete  HRI or Home Care Consult Complete  SW Recovery Care/Counseling Consult Complete  Palliative Care Screening Not Applicable  Skilled Nursing Facility Not Applicable
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
## 2023-04-20 NOTE — Progress Notes (Addendum)
PROGRESS NOTE    Crystal [name]  OZD:[id number] DOB: 10/04/1946 DOA: 04/18/2023 PCP: Crystal Heys, MD  Chief Complaint  Patient presents with   Shortness of Breath    Brief Narrative:   Crystal [name] is Crystal [name] 77 y.o. female with medical history significant for metastatic non-small cell lung cancer, essential pretension, anemia of chronic disease associated baseline hemoglobin 8-10, MSSA bacteremia in March 2024, pulmonary embolism, CKD 3 Crystal [name]-CYS with baseline creatinine 1.1, chronic systolic heart failure, who is admitted to [facility] on 04/18/2023 with HCAP pneumonia after presenting from home to [facility] ED complaining of shortness of breath.    Assessment & Plan:   Principal Problem:   HCAP (healthcare-associated pneumonia) Active Problems:   Essential hypertension   Metastatic non-small cell lung cancer (HCC)   Chronic systolic CHF (congestive heart failure) (HCC)   Acute hypoxic respiratory failure (HCC)   Acute renal failure superimposed on stage 3a chronic kidney disease (HCC)   Sterile pyuria   Anemia of chronic disease   History of pulmonary embolism  Acute Hypoxic Respiratory Failure  Community Acquired Pneumonia Parainfluenza Virus Infection Requiring 3 L  CT chest with diffuse peribronchovascular tree in bud nodularity and diffuse bronchial wall thickening and debris within the trachea Negative MRSA pcr, negative urine strep Pending blood cultures, urine legionella, sputum culture Negative covid RVP with parainfluenza virus  Narrow abx to ceftriaxone/azithromycin prednisone given wheezing on exam  SLP eval with findings concerning for aspiration Hopefully might be ready for discharge tomorrow  AKI on CKD IIIb Mild, follow  Metastatic Non Small Cell Lung Cancer CT with stable 3.8x2.9 cm right middle lobe central mass, interval increase in size of mediastinal LAD, stable R hilar LAD Follows with Dr. Truett [name] outpatient - being treated with  atezolizumab  Asymptomatic Pyuria No si/sx UTI   HTN metoprolol  Hx PE Continue eliquis  HFmrEF Appears euvolemic     DVT prophylaxis: eliquis Code Status: DNR/DNI Family Communication: niece, Crystal [name] Disposition:   Status is: Inpatient Remains inpatient appropriate because: continued need for inpatient care, hypoxia   Consultants:  none  Procedures:  none  Antimicrobials:  Anti-infectives (From admission, onward)    Start     Dose/Rate Route Frequency Ordered Stop   04/20/23 1400  vancomycin (VANCOCIN) IVPB 1000 mg/200 mL premix  Status:  Discontinued        1,000 mg 200 mL/hr over 60 Minutes Intravenous Every 36 hours 04/19/23 0255 04/19/23 1430   04/20/23 0200  cefTRIAXone (ROCEPHIN) 2 g in sodium chloride 0.9 % 100 mL IVPB        2 g 200 mL/hr over 30 Minutes Intravenous Every 24 hours 04/19/23 1430 04/25/23 0159   04/19/23 1600  azithromycin (ZITHROMAX) tablet 500 mg        500 mg Oral Daily 04/19/23 1430 04/24/23 0959   04/19/23 1400  ceFEPIme (MAXIPIME) 2 g in sodium chloride 0.9 % 100 mL IVPB  Status:  Discontinued        2 g 200 mL/hr over 30 Minutes Intravenous Every 12 hours 04/19/23 0255 04/19/23 1430   04/19/23 0215  ceFEPIme (MAXIPIME) 2 g in sodium chloride 0.9 % 100 mL IVPB        2 g 200 mL/hr over 30 Minutes Intravenous  Once 04/19/23 0200 04/19/23 0336   04/19/23 0215  vancomycin (VANCOCIN) IVPB 1000 mg/200 mL premix        1,000 mg 200 mL/hr over 60 Minutes Intravenous  Once 04/19/23 0200 04/19/23 0336  Subjective: Continued SOB  Objective: Vitals:   04/19/23 1655 04/19/23 1953 04/20/23 0543 04/20/23 0810  BP: 138/89 138/87 125/84   Pulse: 96 (!) 104 91   Resp: (!) 21 20 16    Temp: 98 F (36.7 C) 97.7 F (36.5 C) 97.6 F (36.4 C)   TempSrc: Oral Oral Oral   SpO2: 96% 98% 98% 97%  Weight:   92.7 kg   Height:        Intake/Output Summary (Last 24 hours) at 04/20/2023 1207 Last data filed at 04/20/2023 0843 Gross per 24  hour  Intake 3708.57 ml  Output --  Net 3708.57 ml   Filed Weights   04/19/23 0407 04/20/23 0543  Weight: 87.1 kg 92.7 kg    Examination:  General: No acute distress. Cardiovascular: RRR Lungs: continued wheezing, tight lung sounds Abdomen: Soft, nontender, nondistended Neurological: Alert and oriented 3. Moves all extremities 4 with equal strength. Cranial nerves II through XII grossly intact. Extremities: No clubbing or cyanosis. No edema.   Data Reviewed: I have personally reviewed following labs and imaging studies  CBC: Recent Labs  Lab 04/18/23 2200 04/19/23 0541  WBC 9.3 4.0  NEUTROABS 3.9 3.2  HGB 10.1* 8.8*  HCT 33.3* 29.3*  MCV 104.4* 104.3*  PLT 184 140*    Basic Metabolic Panel: Recent Labs  Lab 04/18/23 2200 04/19/23 0541  NA 138 137  K 3.7 3.5  CL 104 104  CO2 26 25  GLUCOSE 158* 197*  BUN 18 19  CREATININE 1.41* 1.35*  CALCIUM 8.2* 7.9*  MG 3.0* 2.2  PHOS  --  3.6    GFR: Estimated Creatinine Clearance: 38.7 mL/min (Crystal [name]) (by C-G formula based on SCr of 1.35 mg/dL (H)).  Liver Function Tests: Recent Labs  Lab 04/18/23 2200 04/19/23 0541  AST 22 17  ALT 14 14  ALKPHOS 74 60  BILITOT 0.5 0.4  PROT 7.3 6.4*  ALBUMIN 2.9* 2.5*    CBG: No results for input(s): "GLUCAP" in the last 168 hours.   Recent Results (from the past 240 hour(s))  Culture, blood (Routine x 2)     Status: None (Preliminary result)   Collection Time: 04/18/23 10:27 PM   Specimen: BLOOD  Result Value Ref Range Status   Specimen Description   Final    BLOOD SITE NOT SPECIFIED Performed at [facility], [address], [place], [place] [address]    Special Requests   Final    BOTTLES DRAWN AEROBIC AND ANAEROBIC Blood Culture adequate volume Performed at [facility], [address] [place], [place] [address]    Culture   Final    NO GROWTH 1 DAY Performed at [facility], [address] [place], [place]  [address]    Report Status PENDING  Incomplete  Culture, blood (Routine x 2)     Status: None (Preliminary result)   Collection Time: 04/18/23 10:45 PM   Specimen: BLOOD  Result Value Ref Range Status   Specimen Description   Final    BLOOD SITE NOT SPECIFIED Performed at [facility], [address], [place], [place] [address]    Special Requests   Final    BOTTLES DRAWN AEROBIC AND ANAEROBIC Blood Culture adequate volume Performed at [facility], [address], [place], [place] [address]    Culture   Final    NO GROWTH 1 DAY Performed at [facility], [address] [place], [place] [address]  Report Status PENDING  Incomplete  Urine Culture     Status: Abnormal   Collection Time: 04/19/23  1:23 AM   Specimen: Urine, Random  Result Value Ref Range Status   Specimen Description   Final    URINE, RANDOM Performed at [facility], [address], [place], [place] [address]    Special Requests   Final    NONE Reflexed from [id number] Performed at [facility], [address], [place], [place] [address]    Culture MULTIPLE SPECIES PRESENT, SUGGEST RECOLLECTION (Marycarmen Hagey)  Final   Report Status 04/20/2023 FINAL  Final  MRSA Next Gen by PCR, Nasal     Status: None   Collection Time: 04/19/23  4:43 AM   Specimen: Nasal Mucosa; Nasal Swab  Result Value Ref Range Status   MRSA by PCR Next Gen NOT DETECTED NOT DETECTED Final    Comment: (NOTE) The GeneXpert MRSA Assay (FDA approved for NASAL specimens only), is one component of Carsyn Taubman comprehensive MRSA colonization surveillance program. It is not intended to diagnose MRSA infection nor to guide or monitor treatment for MRSA infections. Test performance is not FDA approved in patients less than 99 years old. Performed at [facility], [address], [place], [place] [address]   Respiratory (~20 pathogens) panel by PCR     Status: Abnormal    Collection Time: 04/19/23  1:06 PM   Specimen: Nasopharyngeal Swab; Respiratory  Result Value Ref Range Status   Adenovirus NOT DETECTED NOT DETECTED Final   Coronavirus 229E NOT DETECTED NOT DETECTED Final    Comment: (NOTE) The Coronavirus on the Respiratory Panel, DOES NOT test for the novel  Coronavirus (2019 nCoV)    Coronavirus HKU1 NOT DETECTED NOT DETECTED Final   Coronavirus NL63 NOT DETECTED NOT DETECTED Final   Coronavirus OC43 NOT DETECTED NOT DETECTED Final   Metapneumovirus NOT DETECTED NOT DETECTED Final   Rhinovirus / Enterovirus NOT DETECTED NOT DETECTED Final   Influenza Jaylan Duggar NOT DETECTED NOT DETECTED Final   Influenza B NOT DETECTED NOT DETECTED Final   Parainfluenza Virus 1 NOT DETECTED NOT DETECTED Final   Parainfluenza Virus 2 NOT DETECTED NOT DETECTED Final   Parainfluenza Virus 3 DETECTED (Arrin Pintor) NOT DETECTED Final   Parainfluenza Virus 4 NOT DETECTED NOT DETECTED Final   Respiratory Syncytial Virus NOT DETECTED NOT DETECTED Final   Bordetella pertussis NOT DETECTED NOT DETECTED Final   Bordetella Parapertussis NOT DETECTED NOT DETECTED Final   Chlamydophila pneumoniae NOT DETECTED NOT DETECTED Final   Mycoplasma pneumoniae NOT DETECTED NOT DETECTED Final    Comment: Performed at [facility], [address] [place], [place] [address]  SARS Coronavirus 2 by RT PCR (hospital order, performed in [facility] hospital lab) *cepheid single result test* Anterior Nasal Swab     Status: None   Collection Time: 04/19/23  1:06 PM   Specimen: Anterior Nasal Swab  Result Value Ref Range Status   SARS Coronavirus 2 by RT PCR NEGATIVE NEGATIVE Final    Comment: (NOTE) SARS-CoV-2 target nucleic acids are NOT DETECTED.  The SARS-CoV-2 RNA is generally detectable in upper and lower respiratory specimens during the acute phase of infection. The lowest concentration of SARS-CoV-2 viral copies this assay can detect is 250 copies / mL. Shereda Graw negative result does not preclude  SARS-CoV-2 infection and should not be used as the sole basis for treatment or other patient management decisions.  Danalee Flath negative result may occur with improper specimen  collection / handling, submission of specimen other than nasopharyngeal swab, presence of viral mutation(s) within the areas targeted by this assay, and inadequate number of viral copies (<250 copies / mL). Cj Edgell negative result must be combined with clinical observations, patient history, and epidemiological information.  Fact Sheet for Patients:   RoadLapTop.co.za  Fact Sheet for Healthcare Providers: http://kim-miller.com/  This test is not yet approved or  cleared by the [place] FDA and has been authorized for detection and/or diagnosis of SARS-CoV-2 by FDA under an Emergency Use Authorization (EUA).  This EUA will remain in effect (meaning this test can be used) for the duration of the COVID-19 declaration under Section 564(b)(1) of the Act, 21 U.S.C. section 360bbb-3(b)(1), unless the authorization is terminated or revoked sooner.  Performed at [facility], [address], [place], [place] [address]          Radiology Studies: CT Angio Chest PE W and/or Wo Contrast  Result Date: 04/19/2023 CLINICAL DATA:  Pulmonary embolism (PE) suspected, low to intermediate prob, positive D-dimer Pt has stage 4 lung cancer. Pt states hx of clots; takes eliquis EXAM: CT ANGIOGRAPHY CHEST WITH CONTRAST TECHNIQUE: Multidetector CT imaging of the chest was performed using the standard protocol during bolus administration of intravenous contrast. Multiplanar CT image reconstructions and MIPs were obtained to evaluate the vascular anatomy. RADIATION DOSE REDUCTION: This exam was performed according to the departmental dose-optimization program which includes automated exposure control, adjustment of the mA and/or kV according to patient size and/or use of iterative  reconstruction technique. CONTRAST:  80mL OMNIPAQUE IOHEXOL 350 MG/ML SOLN COMPARISON:  CT angio chest 03/05/2023 FINDINGS: Cardiovascular: Satisfactory opacification of the pulmonary arteries to the segmental level. No evidence of pulmonary embolism. Normal heart size. No significant pericardial effusion. The thoracic aorta is normal in caliber. Mild atherosclerotic plaque of the thoracic aorta. At least 3 vessel coronary artery calcifications. Mediastinum/Nodes: Stable 1 cm right hilar lymph node. Interval increase in size of Shailyn Weyandt 1.2 cm (from 0.9 cm ) subcarinal lymph node. Interval increase in size of prominent but nonenlarged mediastinal lymph nodes. No axillary lymph nodes. Debris within the trachea. Thyroid gland, trachea, and esophagus demonstrate no significant findings. Lungs/Pleura: Diffuse bronchial wall thickening. Interval development of diffuse peribronchovascular tree-in-bud nodularity most prominent along the left upper lobe. Grossly stable 3.8 x 2.9 cm right middle lobe central mass. No pulmonary nodule. No pulmonary mass. No pleural effusion. No pneumothorax. Upper Abdomen: No acute abnormality. Musculoskeletal: No chest wall abnormality. Surgical changes along the right breast. Redemonstration of right upper breast 1.3 x 1 cm fluid dense lesion (4:23). No suspicious lytic or blastic osseous lesions. Chronic left lateral ninth rib fracture. Multilevel degenerative changes of the spine. Review of the MIP images confirms the above findings. IMPRESSION: 1. No pulmonary embolus. 2. Interval development of diffuse peribronchovascular tree-in-bud nodularity most prominent along the left upper lobe. Finding consistent with infection. 3. Diffuse bronchial wall thickening and debris within the trachea. 4. Grossly stable 3.8 x 2.9 cm right middle lobe central mass. 5. Interval increase in size of mediastinal lymphadenopathy. Stable right hilar lymphadenopathy. Electronically Signed   By: Tish Frederickson M.D.    On: 04/19/2023 01:02   DG Chest Port 1 View  Result Date: 04/18/2023 CLINICAL DATA:  Dyspnea, lung cancer EXAM: PORTABLE CHEST 1 VIEW COMPARISON:  04/16/2023 FINDINGS: Stable focal consolidation within the right mid lung zone. Stable mild right-sided volume loss. Left lung is clear. No pneumothorax or pleural effusion. Left upper extremity  PICC line tip is unchanged within the superior vena cava. Cardiac size within normal limits. Surgical clips again noted within the right axilla. No acute bone abnormality. IMPRESSION: 1. Stable focal consolidation within the right mid lung zone. No radiographic evidence of acute cardiopulmonary disease. Electronically Signed   By: Helyn Numbers M.D.   On: 04/18/2023 22:22        Scheduled Meds:  apixaban  5 mg Oral BID   azithromycin  500 mg Oral Daily   Chlorhexidine Gluconate Cloth  6 each Topical Daily   ferrous sulfate  325 mg Oral Q lunch   folic acid  1 mg Oral Daily   ipratropium-albuterol  3 mL Nebulization Q6H WA   metoprolol tartrate  50 mg Oral TID   predniSONE  40 mg Oral Q breakfast   sodium chloride flush  10-40 mL Intracatheter Q12H   Continuous Infusions:  sodium chloride     sodium chloride 10 mL/hr (04/20/23 0500)   cefTRIAXone (ROCEPHIN)  IV Stopped (04/20/23 0205)     LOS: 1 day    Time spent: over 30 min    Lacretia Nicks, MD [facility]   To contact the attending provider between 7A-7P or the covering provider during after hours 7P-7A, please log into the web site [email or web address] and access using universal [place] password for that web site. If you do not have the password, please call the hospital operator.  04/20/2023, 12:07 PM

## 2023-04-20 NOTE — Progress Notes (Signed)
SATURATION QUALIFICATIONS: (This note is used to comply with regulatory documentation for home oxygen)  Patient Saturations on Room Air at Rest = 92%  Patient Saturations on Room Air while Ambulating = 87%  Patient Saturations on 2 Liters of oxygen while Ambulating = 94%  Please briefly explain why patient needs home oxygen:  Pt unable to maintain O2 levels above 92% while ambulating.

## 2023-04-20 NOTE — Progress Notes (Signed)
Mobility Specialist - Progress Note  Pre-mobility: 105 bpm HR, 95% SpO2 During mobility: 122 bpm HR, 94% SpO2 Post-mobility: 106 bpm HR, 95% SPO2   04/20/23 1511  Oxygen Therapy  O2 Device Nasal Cannula  O2 Flow Rate (L/min) 3 L/min  Patient Activity (if Appropriate) Ambulating  Mobility  Activity Ambulated with assistance in hallway  Level of Assistance Standby assist, set-up cues, supervision of patient - no hands on  Assistive Device Front wheel walker  Distance Ambulated (ft) 300 ft  Range of Motion/Exercises Active  Activity Response Tolerated fair  Mobility Referral Yes  $Mobility charge 1 Mobility  Mobility Specialist Start Time (ACUTE ONLY) 1450  Mobility Specialist Stop Time (ACUTE ONLY) 1510  Mobility Specialist Time Calculation (min) (ACUTE ONLY) 20 min   Pt was found in bed and agreeable to ambulate. Gets very winded during ambulation and took x1 seated rest break at ~172ft due to getting winded and fatigued for about . At EOS returned to bed with all needs met and call bell in reach.  Billey Chang Mobility Specialist
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
## 2023-04-21 DIAGNOSIS — J910 Malignant pleural effusion: Secondary | ICD-10-CM | POA: Diagnosis not present

## 2023-04-21 DIAGNOSIS — J189 Pneumonia, unspecified organism: Secondary | ICD-10-CM | POA: Diagnosis not present

## 2023-04-21 DIAGNOSIS — J9601 Acute respiratory failure with hypoxia: Secondary | ICD-10-CM | POA: Diagnosis not present

## 2023-04-21 LAB — CBC
HCT: 30.2 % — ABNORMAL LOW (ref 36.0–46.0)
Hemoglobin: 9.1 g/dL — ABNORMAL LOW (ref 12.0–15.0)
MCH: 31.8 pg (ref 26.0–34.0)
MCHC: 30.1 g/dL (ref 30.0–36.0)
MCV: 105.6 fL — ABNORMAL HIGH (ref 80.0–100.0)
Platelets: 149 10*3/uL — ABNORMAL LOW (ref 150–400)
RBC: 2.86 MIL/uL — ABNORMAL LOW (ref 3.87–5.11)
RDW: 16.4 % — ABNORMAL HIGH (ref 11.5–15.5)
WBC: 5.2 10*3/uL (ref 4.0–10.5)
nRBC: 0 % (ref 0.0–0.2)

## 2023-04-21 LAB — BASIC METABOLIC PANEL
Anion gap: 7 (ref 5–15)
BUN: 23 mg/dL (ref 8–23)
CO2: 26 mmol/L (ref 22–32)
Calcium: 8.2 mg/dL — ABNORMAL LOW (ref 8.9–10.3)
Chloride: 107 mmol/L (ref 98–111)
Creatinine, Ser: 1.32 mg/dL — ABNORMAL HIGH (ref 0.44–1.00)
GFR, Estimated: 41 mL/min — ABNORMAL LOW (ref >60)
Glucose, Bld: 96 mg/dL (ref 70–99)
Potassium: 2.9 mmol/L — ABNORMAL LOW (ref 3.5–5.1)
Sodium: 140 mmol/L (ref 135–145)

## 2023-04-21 LAB — CULTURE, BLOOD (ROUTINE X 2)

## 2023-04-21 MED ORDER — BUDESONIDE 0.25 MG/2ML IN SUSP
0.2500 mg | Freq: Two times a day (BID) | RESPIRATORY_TRACT | Status: DC
  Administered 2023-04-21 – 2023-04-23 (×4): 0.25 mg via RESPIRATORY_TRACT
  Filled 2023-04-21 (×4): qty 2

## 2023-04-21 MED ORDER — POTASSIUM CHLORIDE CRYS ER 20 MEQ PO TBCR
40.0000 meq | EXTENDED_RELEASE_TABLET | ORAL | Status: AC
  Administered 2023-04-21 (×2): 40 meq via ORAL
  Filled 2023-04-21 (×2): qty 2

## 2023-04-21 MED ORDER — ARFORMOTEROL TARTRATE 15 MCG/2ML IN NEBU
15.0000 ug | INHALATION_SOLUTION | Freq: Two times a day (BID) | RESPIRATORY_TRACT | Status: DC
  Administered 2023-04-21 – 2023-04-23 (×4): 15 ug via RESPIRATORY_TRACT
  Filled 2023-04-21 (×3): qty 2

## 2023-04-21 MED ORDER — POTASSIUM CHLORIDE CRYS ER 20 MEQ PO TBCR: 40.0000 meq | EXTENDED_RELEASE_TABLET | ORAL | Status: DC

## 2023-04-21 NOTE — Progress Notes (Signed)
PROGRESS NOTE    Crystal [name]  ZOX:[id number] DOB: 1945/11/10 DOA: 04/18/2023 PCP: Blair Heys, MD  Chief Complaint  Patient presents with   Shortness of Breath    Brief Narrative:   Crystal [name] is Macio Kissoon 77 y.o. female with medical history significant for metastatic non-small cell lung cancer, essential pretension, anemia of chronic disease associated baseline hemoglobin 8-10, MSSA bacteremia in March 2024, pulmonary embolism, CKD 3 Amin Fornwalt-CYS with baseline creatinine 1.1, chronic systolic heart failure, who is admitted to [facility] on 04/18/2023 with HCAP pneumonia after presenting from home to [facility] ED complaining of shortness of breath.    Assessment & Plan:   Principal Problem:   HCAP (healthcare-associated pneumonia) Active Problems:   Essential hypertension   Metastatic non-small cell lung cancer (HCC)   Chronic systolic CHF (congestive heart failure) (HCC)   Acute hypoxic respiratory failure (HCC)   Acute renal failure superimposed on stage 3a chronic kidney disease (HCC)   Sterile pyuria   Anemia of chronic disease   History of pulmonary embolism  Acute Hypoxic Respiratory Failure  Community Acquired Pneumonia Parainfluenza Virus Infection Requiring 3 L  CT chest with diffuse peribronchovascular tree in bud nodularity and diffuse bronchial wall thickening and debris within the trachea Negative MRSA pcr, negative urine strep Pending blood cultures, urine legionella, sputum culture Negative covid RVP with parainfluenza virus  Narrow abx to ceftriaxone/azithromycin prednisone given wheezing on exam  SLP eval with findings concerning for aspiration -> recommending regular/thin liquid  Continued SOB today, will follow with conservative management  AKI on CKD IIIb Mild, follow  Metastatic Non Small Cell Lung Cancer CT with stable 3.8x2.9 cm right middle lobe central mass, interval increase in size of mediastinal LAD, stable R hilar LAD Follows with Dr.  Truett Perna outpatient - being treated with atezolizumab  Asymptomatic Pyuria No si/sx UTI   HTN metoprolol  Hx PE Continue eliquis  HFmrEF Appears euvolemic   Hypokalemic Replace and follow     DVT prophylaxis: eliquis Code Status: DNR/DNI Family Communication: niece, April Disposition:   Status is: Inpatient Remains inpatient appropriate because: continued need for inpatient care, hypoxia   Consultants:  none  Procedures:  none  Antimicrobials:  Anti-infectives (From admission, onward)    Start     Dose/Rate Route Frequency Ordered Stop   04/20/23 1400  vancomycin (VANCOCIN) IVPB 1000 mg/200 mL premix  Status:  Discontinued        1,000 mg 200 mL/hr over 60 Minutes Intravenous Every 36 hours 04/19/23 0255 04/19/23 1430   04/20/23 0200  cefTRIAXone (ROCEPHIN) 2 g in sodium chloride 0.9 % 100 mL IVPB        2 g 200 mL/hr over 30 Minutes Intravenous Every 24 hours 04/19/23 1430 04/25/23 0159   04/19/23 1600  azithromycin (ZITHROMAX) tablet 500 mg        500 mg Oral Daily 04/19/23 1430 04/24/23 0959   04/19/23 1400  ceFEPIme (MAXIPIME) 2 g in sodium chloride 0.9 % 100 mL IVPB  Status:  Discontinued        2 g 200 mL/hr over 30 Minutes Intravenous Every 12 hours 04/19/23 0255 04/19/23 1430   04/19/23 0215  ceFEPIme (MAXIPIME) 2 g in sodium chloride 0.9 % 100 mL IVPB        2 g 200 mL/hr over 30 Minutes Intravenous  Once 04/19/23 0200 04/19/23 0336   04/19/23 0215  vancomycin (VANCOCIN) IVPB 1000 mg/200 mL premix        1,000 mg  200 mL/hr over 60 Minutes Intravenous  Once 04/19/23 0200 04/19/23 0336       Subjective: No new complaints  Objective: Vitals:   04/21/23 0600 04/21/23 0830 04/21/23 0832 04/21/23 0833  BP:      Pulse:      Resp:      Temp:      TempSrc:      SpO2:  98% 98% 98%  Weight: 94.7 kg     Height:        Intake/Output Summary (Last 24 hours) at 04/21/2023 1300 Last data filed at 04/21/2023 1127 Gross per 24 hour  Intake 1400.91 ml   Output 600 ml  Net 800.91 ml   Filed Weights   04/19/23 0407 04/20/23 0543 04/21/23 0600  Weight: 87.1 kg 92.7 kg 94.7 kg    Examination:  General: No acute distress. Cardiovascular: RRR Lungs: diffuse scattered wheezing, tachypnea - SOB with minimal exertion (just sitting up in bed) Abdomen: Soft, nontender, nondistended Neurological: Alert and oriented 3. Moves all extremities 4 with equal strength. Cranial nerves II through XII grossly intact. Skin: Warm and dry. No rashes or lesions. Extremities: No clubbing or cyanosis. No edema.  Data Reviewed: I have personally reviewed following labs and imaging studies  CBC: Recent Labs  Lab 04/18/23 2200 04/19/23 0541 04/21/23 0955  WBC 9.3 4.0 5.2  NEUTROABS 3.9 3.2  --   HGB 10.1* 8.8* 9.1*  HCT 33.3* 29.3* 30.2*  MCV 104.4* 104.3* 105.6*  PLT 184 140* 149*    Basic Metabolic Panel: Recent Labs  Lab 04/18/23 2200 04/19/23 0541 04/21/23 0955  NA 138 137 140  K 3.7 3.5 2.9*  CL 104 104 107  CO2 26 25 26   GLUCOSE 158* 197* 96  BUN 18 19 23   CREATININE 1.41* 1.35* 1.32*  CALCIUM 8.2* 7.9* 8.2*  MG 3.0* 2.2  --   PHOS  --  3.6  --     GFR: Estimated Creatinine Clearance: 40 mL/min (Tamela Elsayed) (by C-G formula based on SCr of 1.32 mg/dL (H)).  Liver Function Tests: Recent Labs  Lab 04/18/23 2200 04/19/23 0541  AST 22 17  ALT 14 14  ALKPHOS 74 60  BILITOT 0.5 0.4  PROT 7.3 6.4*  ALBUMIN 2.9* 2.5*    CBG: No results for input(s): "GLUCAP" in the last 168 hours.   Recent Results (from the past 240 hour(s))  Culture, blood (Routine x 2)     Status: None (Preliminary result)   Collection Time: 04/18/23 10:27 PM   Specimen: BLOOD  Result Value Ref Range Status   Specimen Description   Final    BLOOD SITE NOT SPECIFIED Performed at [facility], [address], [place], [place] [address]    Special Requests   Final    BOTTLES DRAWN AEROBIC AND ANAEROBIC Blood Culture adequate  volume Performed at [facility], [address], [place], [place] [address]    Culture   Final    NO GROWTH 2 DAYS Performed at [facility], [address] [place], [place] [address]    Report Status PENDING  Incomplete  Culture, blood (Routine x 2)     Status: None (Preliminary result)   Collection Time: 04/18/23 10:45 PM   Specimen: BLOOD  Result Value Ref Range Status   Specimen Description   Final    BLOOD SITE NOT SPECIFIED Performed at [facility], [address], [place], [place] [address]    Special Requests  Final    BOTTLES DRAWN AEROBIC AND ANAEROBIC Blood Culture adequate volume Performed at [facility], [address] [place], [place] [address]    Culture   Final    NO GROWTH 2 DAYS Performed at [facility], [address], [place], [place] [address]    Report Status PENDING  Incomplete  Urine Culture     Status: Abnormal   Collection Time: 04/19/23  1:23 AM   Specimen: Urine, Random  Result Value Ref Range Status   Specimen Description   Final    URINE, RANDOM Performed at [facility], [address], [place], [place] [address]    Special Requests   Final    NONE Reflexed from [id number] [phone] Performed at [facility], [address], [place], [place] [address]    Culture MULTIPLE SPECIES PRESENT, SUGGEST RECOLLECTION (Dene Landsberg)  Final   Report Status 04/20/2023 FINAL  Final  MRSA Next Gen by PCR, Nasal     Status: None   Collection Time: 04/19/23  4:43 AM   Specimen: Nasal Mucosa; Nasal Swab  Result Value Ref Range Status   MRSA by PCR Next Gen NOT DETECTED NOT DETECTED Final    Comment: (NOTE) The GeneXpert MRSA Assay (FDA approved for NASAL specimens only), is one component of Zadok Holaway comprehensive MRSA colonization surveillance program. It is not intended to diagnose MRSA infection nor to guide or monitor treatment for MRSA infections. Test performance is  not FDA approved in patients less than 40 years old. Performed at [facility], [address] [place], [place] [address]   Respiratory (~20 pathogens) panel by PCR     Status: Abnormal   Collection Time: 04/19/23  1:06 PM   Specimen: Nasopharyngeal Swab; Respiratory  Result Value Ref Range Status   Adenovirus NOT DETECTED NOT DETECTED Final   Coronavirus 229E NOT DETECTED NOT DETECTED Final    Comment: (NOTE) The Coronavirus on the Respiratory Panel, DOES NOT test for the novel  Coronavirus (2019 nCoV)    Coronavirus HKU1 NOT DETECTED NOT DETECTED Final   Coronavirus NL63 NOT DETECTED NOT DETECTED Final   Coronavirus OC43 NOT DETECTED NOT DETECTED Final   Metapneumovirus NOT DETECTED NOT DETECTED Final   Rhinovirus / Enterovirus NOT DETECTED NOT DETECTED Final   Influenza Bradd Merlos NOT DETECTED NOT DETECTED Final   Influenza B NOT DETECTED NOT DETECTED Final   Parainfluenza Virus 1 NOT DETECTED NOT DETECTED Final   Parainfluenza Virus 2 NOT DETECTED NOT DETECTED Final   Parainfluenza Virus 3 DETECTED (Alizah Sills) NOT DETECTED Final   Parainfluenza Virus 4 NOT DETECTED NOT DETECTED Final   Respiratory Syncytial Virus NOT DETECTED NOT DETECTED Final   Bordetella pertussis NOT DETECTED NOT DETECTED Final   Bordetella Parapertussis NOT DETECTED NOT DETECTED Final   Chlamydophila pneumoniae NOT DETECTED NOT DETECTED Final   Mycoplasma pneumoniae NOT DETECTED NOT DETECTED Final    Comment: Performed at [facility], [address] [place], [place] [address]  SARS Coronavirus 2 by RT PCR (hospital order, performed in [facility] hospital lab) *cepheid single result test* Anterior Nasal Swab     Status: None   Collection Time: 04/19/23  1:06 PM   Specimen: Anterior Nasal Swab  Result Value Ref Range Status   SARS Coronavirus 2 by RT PCR NEGATIVE NEGATIVE Final    Comment: (NOTE) SARS-CoV-2 target nucleic acids are NOT DETECTED.  The SARS-CoV-2 RNA is generally detectable in  upper and lower respiratory  specimens during the acute phase of infection. The lowest concentration of SARS-CoV-2 viral copies this assay can detect is 250 copies / mL. Tifanie Gardiner negative result does not preclude SARS-CoV-2 infection and should not be used as the sole basis for treatment or other patient management decisions.  Myrtice Lowdermilk negative result may occur with improper specimen collection / handling, submission of specimen other than nasopharyngeal swab, presence of viral mutation(s) within the areas targeted by this assay, and inadequate number of viral copies (<250 copies / mL). Shuaib Corsino negative result must be combined with clinical observations, patient history, and epidemiological information.  Fact Sheet for Patients:   RoadLapTop.co.za  Fact Sheet for Healthcare Providers: http://kim-miller.com/  This test is not yet approved or  cleared by the [place] FDA and has been authorized for detection and/or diagnosis of SARS-CoV-2 by FDA under an Emergency Use Authorization (EUA).  This EUA will remain in effect (meaning this test can be used) for the duration of the COVID-19 declaration under Section 564(b)(1) of the Act, 21 U.S.C. section 360bbb-3(b)(1), unless the authorization is terminated or revoked sooner.  Performed at [facility], [address] [place], [place] [address]          Radiology Studies: No results found.      Scheduled Meds:  apixaban  5 mg Oral BID   azithromycin  500 mg Oral Daily   Chlorhexidine Gluconate Cloth  6 each Topical Daily   ferrous sulfate  325 mg Oral Q lunch   folic acid  1 mg Oral Daily   ipratropium-albuterol  3 mL Nebulization TID   metoprolol tartrate  50 mg Oral TID   potassium chloride  40 mEq Oral Q4H   predniSONE  40 mg Oral Q breakfast   sodium chloride flush  10-40 mL Intracatheter Q12H   Continuous Infusions:  cefTRIAXone (ROCEPHIN)  IV Stopped (04/21/23 0225)      LOS: 2 days    Time spent: over 30 min    Lacretia Nicks, MD [facility]   To contact the attending provider between 7A-7P or the covering provider during after hours 7P-7A, please log into the web site [email or web address] and access using universal [place] password for that web site. If you do not have the password, please call the hospital operator.  04/21/2023, 1:00 PM

## 2023-04-21 NOTE — Plan of Care (Signed)
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
## 2023-04-21 NOTE — Evaluation (Signed)
Physical Therapy Evaluation Patient Details Name: Crystal [name] MRN: [id number] DOB: [birth year] Today's Date: 04/21/2023  History of Present Illness  77 yo female admitted with Pna, parainfluenza virus infection. Hx of obesity, CKD, breast ca, PE, met NSCLC, CHF, L2 KP  Clinical Impression  On eval, pt was Min guard A for mobility. She walked ~60 feet x 2 with a RW. Pt does fatigue and become dyspneic with minimal ambulation. Remained on [place] O2 during session. Assisted pt into recliner at end of session. Will plan to follow and progress activity as tolerated.        Recommendations for follow up therapy are one component of a multi-disciplinary discharge planning process, led by the attending physician.  Recommendations may be updated based on patient status, additional functional criteria and insurance authorization.  Follow Up Recommendations       Assistance Recommended at Discharge PRN  Patient can return home with the following  Assistance with cooking/housework;Assist for transportation;Help with stairs or ramp for entrance    Equipment Recommendations None recommended by PT  Recommendations for Other Services       Functional Status Assessment Patient has had a recent decline in their functional status and demonstrates the ability to make significant improvements in function in a reasonable and predictable amount of time.     Precautions / Restrictions Precautions Precautions: Fall Restrictions Weight Bearing Restrictions: No      Mobility  Bed Mobility Overal bed mobility: Modified Independent                  Transfers Overall transfer level: Modified independent                      Ambulation/Gait Ambulation/Gait assistance: Min guard Gait Distance (Feet): 60 Feet (x2) Assistive device: Rolling walker (2 wheels) Gait Pattern/deviations: Step-through pattern, Decreased stride length Gait velocity: fast with AD-cues for pacing, breathing. Min  guard for safety. Brief seated rest break. Remained on [place] O2        Stairs            Wheelchair Mobility    Modified Rankin (Stroke Patients Only)       Balance Overall balance assessment: Needs assistance         Standing balance support: Bilateral upper extremity supported, During functional activity Standing balance-Leahy Scale: Fair                               Pertinent Vitals/Pain Pain Assessment Pain Assessment: No/denies pain    Home Living Family/patient expects to be discharged to:: Assisted living     Type of Home: Assisted living Home Access: Level entry       Home Layout: One level Home Equipment: Rollator (4 wheels);Wheelchair - manual      Prior Function               Mobility Comments: uses walker inside her room, wheelchair outside of room (to get to dining room, etc) ADLs Comments: mod ind     Hand Dominance        Extremity/Trunk Assessment   Upper Extremity Assessment Upper Extremity Assessment: Defer to OT evaluation    Lower Extremity Assessment Lower Extremity Assessment: Generalized weakness    Cervical / Trunk Assessment Cervical / Trunk Assessment: Normal  Communication   Communication: No difficulties  Cognition Arousal/Alertness: Awake/alert Behavior During Therapy: WFL for tasks assessed/performed Overall Cognitive Status: Within  Functional Limits for tasks assessed                                          General Comments      Exercises     Assessment/Plan    PT Assessment Patient needs continued PT services  PT Problem List Decreased strength;Decreased range of motion;Decreased activity tolerance;Decreased balance;Decreased mobility;Decreased knowledge of use of DME       PT Treatment Interventions DME instruction;Therapeutic exercise;Gait training;Balance training;Functional mobility training;Therapeutic activities;Patient/family education    PT Goals  (Current goals can be found in the Care Plan section)  Acute Rehab PT Goals Patient Stated Goal: none stated except back to ALF PT Goal Formulation: With patient Time For Goal Achievement: 05/05/23 Potential to Achieve Goals: Good    Frequency Min 1X/week     Co-evaluation               AM-PAC PT "6 Clicks" Mobility  Outcome Measure Help needed turning from your back to your side while in a flat bed without using bedrails?: None Help needed moving from lying on your back to sitting on the side of a flat bed without using bedrails?: None Help needed moving to and from a bed to a chair (including a wheelchair)?: None Help needed standing up from a chair using your arms (e.g., wheelchair or bedside chair)?: None Help needed to walk in hospital room?: A Little Help needed climbing 3-5 steps with a railing? : A Little 6 Click Score: 22    End of Session Equipment Utilized During Treatment: Oxygen Activity Tolerance: Patient limited by fatigue;Patient tolerated treatment well Patient left: in chair;with call bell/phone within reach   PT Visit Diagnosis: Difficulty in walking, not elsewhere classified (R26.2)    Time: 1610-9604 PT Time Calculation (min) (ACUTE ONLY): 12 min   Charges:   PT Evaluation $PT Eval Low Complexity: 1 Low             Faye Ramsay, PT Acute Rehabilitation  Office: [phone]

## 2023-04-22 DIAGNOSIS — J189 Pneumonia, unspecified organism: Secondary | ICD-10-CM | POA: Diagnosis not present

## 2023-04-22 LAB — CBC WITH DIFFERENTIAL/PLATELET
Abs Immature Granulocytes: 0.05 10*3/uL (ref 0.00–0.07)
Basophils Absolute: 0 10*3/uL (ref 0.0–0.1)
Basophils Relative: 0 %
Eosinophils Absolute: 0 10*3/uL (ref 0.0–0.5)
Eosinophils Relative: 0 %
HCT: 28.8 % — ABNORMAL LOW (ref 36.0–46.0)
Hemoglobin: 8.7 g/dL — ABNORMAL LOW (ref 12.0–15.0)
Immature Granulocytes: 1 %
Lymphocytes Relative: 33 %
Lymphs Abs: 1.6 10*3/uL (ref 0.7–4.0)
MCH: 31.6 pg (ref 26.0–34.0)
MCHC: 30.2 g/dL (ref 30.0–36.0)
MCV: 104.7 fL — ABNORMAL HIGH (ref 80.0–100.0)
Monocytes Absolute: 0.3 10*3/uL (ref 0.1–1.0)
Monocytes Relative: 7 %
Neutro Abs: 2.9 10*3/uL (ref 1.7–7.7)
Neutrophils Relative %: 59 %
Platelets: 147 10*3/uL — ABNORMAL LOW (ref 150–400)
RBC: 2.75 MIL/uL — ABNORMAL LOW (ref 3.87–5.11)
RDW: 16.6 % — ABNORMAL HIGH (ref 11.5–15.5)
WBC: 4.9 10*3/uL (ref 4.0–10.5)
nRBC: 0 % (ref 0.0–0.2)

## 2023-04-22 LAB — COMPREHENSIVE METABOLIC PANEL
ALT: 14 U/L (ref 0–44)
AST: 16 U/L (ref 15–41)
Albumin: 2.3 g/dL — ABNORMAL LOW (ref 3.5–5.0)
Alkaline Phosphatase: 47 U/L (ref 38–126)
Anion gap: 6 (ref 5–15)
BUN: 23 mg/dL (ref 8–23)
CO2: 25 mmol/L (ref 22–32)
Calcium: 8 mg/dL — ABNORMAL LOW (ref 8.9–10.3)
Chloride: 110 mmol/L (ref 98–111)
Creatinine, Ser: 1.12 mg/dL — ABNORMAL HIGH (ref 0.44–1.00)
GFR, Estimated: 50 mL/min — ABNORMAL LOW (ref >60)
Glucose, Bld: 116 mg/dL — ABNORMAL HIGH (ref 70–99)
Potassium: 4 mmol/L (ref 3.5–5.1)
Sodium: 141 mmol/L (ref 135–145)
Total Bilirubin: 0.4 mg/dL (ref 0.3–1.2)
Total Protein: 5.9 g/dL — ABNORMAL LOW (ref 6.5–8.1)

## 2023-04-22 LAB — CULTURE, BLOOD (ROUTINE X 2): Special Requests: ADEQUATE

## 2023-04-22 LAB — MAGNESIUM: Magnesium: 1.8 mg/dL (ref 1.7–2.4)

## 2023-04-22 LAB — PHOSPHORUS: Phosphorus: 3.4 mg/dL (ref 2.5–4.6)

## 2023-04-22 MED ORDER — AMOXICILLIN 500 MG PO CAPS: 1000.0000 mg | ORAL_CAPSULE | Freq: Three times a day (TID) | ORAL | 0 refills | Status: DC

## 2023-04-22 MED ORDER — PREDNISONE 20 MG PO TABS: ORAL_TABLET | ORAL | 0 refills | Status: DC

## 2023-04-22 NOTE — TOC Progression Note (Signed)
Transition of Care [facility]) - Progression Note    Patient Details  Name: Crystal [name] MRN: [id number] Date of Birth: [birth year]  [facility] CM/SW Contact  Amada Jupiter, LCSW Phone Number: 04/22/2023, 2:15 PM  Clinical Narrative:     Alerted by MD that pt is medically ready for dc back to ALF today.  Contacted [facility] and was told there is no Charity fundraiser on duty today and their RN must clear pt for return.  RN will be onsite tomorrow.  DC held until tomorrow and pt aware.  Will ask oncoming TOC tomorrow to reach out to facility and send FL2 and dc summary for review.  Expected Discharge Plan: Assisted Living Barriers to Discharge: Continued Medical Work up  Expected Discharge Plan and Services In-house Referral: NA Discharge Planning Services: NA Post Acute Care Choice: NA Living arrangements for the past 2 months: Assisted Living Facility ([facility])                 DME Arranged: N/A DME Agency: NA       HH Arranged: NA HH Agency: NA         Social Determinants of Health (SDOH) Interventions SDOH Screenings   Food Insecurity: No Food Insecurity (04/19/2023)  Housing: Low Risk  (04/19/2023)  Transportation Needs: No Transportation Needs (04/19/2023)  Utilities: Not At Risk (04/19/2023)  Depression (PHQ2-9): Low Risk  (03/02/2023)  Tobacco Use: Low Risk  (04/19/2023)    Readmission Risk Interventions    03/09/2023   12:06 PM  Readmission Risk Prevention Plan  Transportation Screening Complete  Medication Review (RN Care Manager) Complete  PCP or Specialist appointment within 3-5 days of discharge Complete  HRI or Home Care Consult Complete  SW Recovery Care/Counseling Consult Complete  Palliative Care Screening Not Applicable  Skilled Nursing Facility Not Applicable

## 2023-04-22 NOTE — Discharge Summary (Signed)
Physician Discharge Summary  Crystal [name] YQM:[id number] DOB: 04/04/1946 DOA: 04/18/2023  PCP: Blair Heys, MD  Admit date: 04/18/2023 Discharge date: 04/22/2023  Time spent: 40 minutes  Recommendations for Outpatient Follow-up:  Follow outpatient CBC/CMP  Follow repeat chest imaging outpatient Follow with oncology outpatient   Discharge Diagnoses:  Principal Problem:   HCAP (healthcare-associated pneumonia) Active Problems:   Essential hypertension   Metastatic non-small cell lung cancer (HCC)   Chronic systolic CHF (congestive heart failure) (HCC)   Acute hypoxic respiratory failure (HCC)   Acute renal failure superimposed on stage 3a chronic kidney disease (HCC)   Sterile pyuria   Anemia of chronic disease   History of pulmonary embolism   Discharge Condition: stable  Diet recommendation: heart healthy  Filed Weights   04/20/23 0543 04/21/23 0600 04/22/23 0600  Weight: 92.7 kg 94.7 kg 94.9 kg    History of present illness:   NICOLI [name] is [name] 77 y.o. female with medical history significant for metastatic non-small cell lung cancer, essential pretension, anemia of chronic disease associated baseline hemoglobin 8-10, MSSA bacteremia in March 2024, pulmonary embolism, CKD 3 Juwann Sherk-CYS with baseline creatinine 1.1, chronic systolic heart failure, who is admitted to [facility] on 04/18/2023 with HCAP pneumonia after presenting from home to [facility] ED complaining of shortness of breath.    She was admitted with acute hypoxic respiratory failure due to community acquired pneumonia and parainfluenza virus infection.  Gradually improved with supportive care.   See below for additional details  Hospital Course:  Assessment and Plan:  Acute Hypoxic Respiratory Failure  Community Acquired Pneumonia Parainfluenza Virus Infection Requiring 3 L  -> improved to RA today CT chest with diffuse peribronchovascular tree in bud nodularity and diffuse bronchial wall thickening  and debris within the trachea Negative MRSA pcr, negative urine strep Pending blood cultures, urine legionella, sputum culture Negative covid RVP with parainfluenza virus  Narrow abx to ceftriaxone/azithromycin prednisone given wheezing on exam  SLP eval with findings concerning for aspiration -> recommending regular/thin liquid  Continued SOB today, will follow with conservative management   AKI on CKD IIIb Mild, follow   Metastatic Non Small Cell Lung Cancer CT with stable 3.8x2.9 cm right middle lobe central mass, interval increase in size of mediastinal LAD, stable R hilar LAD Follows with Dr. Truett Perna outpatient - being treated with atezolizumab   Asymptomatic Pyuria No si/sx UTI    HTN metoprolol   Hx PE Continue eliquis   HFmrEF Appears euvolemic    Hypokalemic Replace and follow       Procedures: none   Consultations: none  Discharge Exam: Vitals:   04/22/23 1151 04/22/23 1153  BP:    Pulse: (!) 108 (!) 109  Resp:    Temp:    SpO2: 94% 95%   No complaints Feels Ellia Knowlton little better  General: No acute distress. Cardiovascular: RRR Lungs: improved wheezing  Abdomen: Soft, nontender, nondistended  Neurological: Alert and oriented 3. Moves all extremities 4 with equal strength. Cranial nerves II through XII grossly intact. Extremities: No clubbing or cyanosis. No edema.   Discharge Instructions   Discharge Instructions     Call MD for:  difficulty breathing, headache or visual disturbances   Complete by: As directed    Call MD for:  extreme fatigue   Complete by: As directed    Call MD for:  hives   Complete by: As directed    Call MD for:  persistant dizziness or light-headedness   Complete  by: As directed    Call MD for:  persistant nausea and vomiting   Complete by: As directed    Call MD for:  redness, tenderness, or signs of infection (pain, swelling, redness, odor or green/yellow discharge around incision site)   Complete by: As  directed    Call MD for:  severe uncontrolled pain   Complete by: As directed    Call MD for:  temperature >100.4   Complete by: As directed    Diet - low sodium heart healthy   Complete by: As directed    Discharge instructions   Complete by: As directed    You were seen for shortness of breath.  You had Kainan Patty parainfluenza virus infection.  You've improved with steroids.  We'll send you home with steroids and Zissel Biederman couple more days of antibiotics just in case there's Shaquil Aldana bacterial infection.  Follow up with your PCP as an outpatient.  Follow up with your cancer doctor as an outpatient.  Return for new, recurrent, or worsening symptoms.  Please ask your PCP to request records from this hospitalization so they know what was done and what the next steps will be.   Increase activity slowly   Complete by: As directed    No wound care   Complete by: As directed       Allergies as of 04/22/2023       Reactions   Simvastatin Other (See Comments)   Leg cramps   Zoloft [sertraline] Nausea Only   Codeine Other (See Comments)   Bad Headaches   Coreg Other (See Comments)   "Made my legs hurt"   Lisinopril Cough   Tizanidine Hcl Rash, Other (See Comments)   Hypotension, also        Medication List     TAKE these medications    amoxicillin 500 MG capsule Commonly known as: AMOXIL Take 2 capsules (1,000 mg total) by mouth 3 (three) times daily for 3 days.   apixaban 5 MG Tabs tablet Commonly known as: Eliquis Take 1 tablet (5 mg total) by mouth 2 (two) times daily.   ferrous sulfate 325 (65 FE) MG tablet Take 1 tablet (325 mg total) by mouth daily with lunch.   folic acid 1 MG tablet Commonly known as: FOLVITE Take 1 tablet (1 mg total) by mouth daily.   HYDROcodone bit-homatropine 5-1.5 MG/5ML syrup Commonly known as: HYCODAN Take 5 mLs by mouth every 6 (six) hours as needed for cough.   LORazepam 0.5 MG tablet Commonly known as: ATIVAN Take 1 tablet (0.5 mg total) by  mouth 2 (two) times daily as needed for anxiety.   magnesium oxide 400 (240 Mg) MG tablet Commonly known as: MAG-OX Take 1 tablet (400 mg total) by mouth 2 (two) times daily.   metoprolol tartrate 50 MG tablet Commonly known as: LOPRESSOR Take 1 tablet (50 mg total) by mouth 3 (three) times daily.   potassium chloride SA 20 MEQ tablet Commonly known as: KLOR-CON M Take 1 tablet (20 mEq total) by mouth daily.   predniSONE 20 MG tablet Commonly known as: DELTASONE Take 2 tablets (40 mg total) by mouth daily with breakfast for 1 day, THEN 1.5 tablets (30 mg total) daily with breakfast for 2 days, THEN 1 tablet (20 mg total) daily with breakfast for 2 days, THEN 0.5 tablets (10 mg total) daily with breakfast for 2 days. Start taking on: April 23, 2023       Allergies  Allergen Reactions   Simvastatin  Other (See Comments)    Leg cramps   Zoloft [Sertraline] Nausea Only   Codeine Other (See Comments)    Bad Headaches   Coreg Other (See Comments)    "Made my legs hurt"   Lisinopril Cough   Tizanidine Hcl Rash and Other (See Comments)    Hypotension, also      The results of significant diagnostics from this hospitalization (including imaging, microbiology, ancillary and laboratory) are listed below for reference.    Significant Diagnostic Studies: CT Angio Chest PE W and/or Wo Contrast  Result Date: 04/19/2023 CLINICAL DATA:  Pulmonary embolism (PE) suspected, low to intermediate prob, positive D-dimer Pt has stage 4 lung cancer. Pt states hx of clots; takes eliquis EXAM: CT ANGIOGRAPHY CHEST WITH CONTRAST TECHNIQUE: Multidetector CT imaging of the chest was performed using the standard protocol during bolus administration of intravenous contrast. Multiplanar CT image reconstructions and MIPs were obtained to evaluate the vascular anatomy. RADIATION DOSE REDUCTION: This exam was performed according to the departmental dose-optimization program which includes automated exposure  control, adjustment of the mA and/or kV according to patient size and/or use of iterative reconstruction technique. CONTRAST:  80mL OMNIPAQUE IOHEXOL 350 MG/ML SOLN COMPARISON:  CT angio chest 03/05/2023 FINDINGS: Cardiovascular: Satisfactory opacification of the pulmonary arteries to the segmental level. No evidence of pulmonary embolism. Normal heart size. No significant pericardial effusion. The thoracic aorta is normal in caliber. Mild atherosclerotic plaque of the thoracic aorta. At least 3 vessel coronary artery calcifications. Mediastinum/Nodes: Stable 1 cm right hilar lymph node. Interval increase in size of Terrika Zuver 1.2 cm (from 0.9 cm ) subcarinal lymph node. Interval increase in size of prominent but nonenlarged mediastinal lymph nodes. No axillary lymph nodes. Debris within the trachea. Thyroid gland, trachea, and esophagus demonstrate no significant findings. Lungs/Pleura: Diffuse bronchial wall thickening. Interval development of diffuse peribronchovascular tree-in-bud nodularity most prominent along the left upper lobe. Grossly stable 3.8 x 2.9 cm right middle lobe central mass. No pulmonary nodule. No pulmonary mass. No pleural effusion. No pneumothorax. Upper Abdomen: No acute abnormality. Musculoskeletal: No chest wall abnormality. Surgical changes along the right breast. Redemonstration of right upper breast 1.3 x 1 cm fluid dense lesion (4:23). No suspicious lytic or blastic osseous lesions. Chronic left lateral ninth rib fracture. Multilevel degenerative changes of the spine. Review of the MIP images confirms the above findings. IMPRESSION: 1. No pulmonary embolus. 2. Interval development of diffuse peribronchovascular tree-in-bud nodularity most prominent along the left upper lobe. Finding consistent with infection. 3. Diffuse bronchial wall thickening and debris within the trachea. 4. Grossly stable 3.8 x 2.9 cm right middle lobe central mass. 5. Interval increase in size of mediastinal  lymphadenopathy. Stable right hilar lymphadenopathy. Electronically Signed   By: Tish Frederickson M.D.   On: 04/19/2023 01:02   DG Chest Port 1 View  Result Date: 04/18/2023 CLINICAL DATA:  Dyspnea, lung cancer EXAM: PORTABLE CHEST 1 VIEW COMPARISON:  04/16/2023 FINDINGS: Stable focal consolidation within the right mid lung zone. Stable mild right-sided volume loss. Left lung is clear. No pneumothorax or pleural effusion. Left upper extremity PICC line tip is unchanged within the superior vena cava. Cardiac size within normal limits. Surgical clips again noted within the right axilla. No acute bone abnormality. IMPRESSION: 1. Stable focal consolidation within the right mid lung zone. No radiographic evidence of acute cardiopulmonary disease. Electronically Signed   By: Helyn Numbers M.D.   On: 04/18/2023 22:22   DG Chest 2 View  Result Date: 04/16/2023 CLINICAL  DATA:  lung cancer, cough/congestion, low grade fever EXAM: CHEST - 2 VIEW COMPARISON:  Chest x-ray March 26, 2023. CT of the chest Mar 05, 2023. FINDINGS: Similar band like opacity in the right lower lung. Left lung is clear. Left PICC terminates near the superior cavoatrial junction. No visible pleural effusions or pneumothorax. IMPRESSION: Similar band like opacity in the right lower lung, likely representing the known mass and postobstructive atelectasis and/or scarring. Superimposed infection is difficult to exclude. Electronically Signed   By: Feliberto Harts M.D.   On: 04/16/2023 14:24   DG Chest 2 View  Result Date: 03/26/2023 CLINICAL DATA:  Follow-up exam. EXAM: CHEST - 2 VIEW COMPARISON:  03/06/2023. FINDINGS: Unchanged left upper extremity PICC with tip projecting over the superior cavoatrial junction. Unchanged bandlike opacities in the right lower lung, favored to reflect atelectasis and/or scarring. No consolidation or pulmonary edema. Stable cardiac and mediastinal contours. No pleural effusion or pneumothorax. IMPRESSION: Unchanged  bandlike opacities in the right lower lung, favored to reflect atelectasis and/or scarring. Electronically Signed   By: Orvan Falconer M.D.   On: 03/26/2023 16:16    Microbiology: Recent Results (from the past 240 hour(s))  Culture, blood (Routine x 2)     Status: None (Preliminary result)   Collection Time: 04/18/23 10:27 PM   Specimen: BLOOD  Result Value Ref Range Status   Specimen Description   Final    BLOOD SITE NOT SPECIFIED Performed at [facility], [address], [place], [place] [address]    Special Requests   Final    BOTTLES DRAWN AEROBIC AND ANAEROBIC Blood Culture adequate volume Performed at [facility], [address], [place], [place] [address]    Culture   Final    NO GROWTH 3 DAYS Performed at [facility], [address] [place], [place] [address]    Report Status PENDING  Incomplete  Culture, blood (Routine x 2)     Status: None (Preliminary result)   Collection Time: 04/18/23 10:45 PM   Specimen: BLOOD  Result Value Ref Range Status   Specimen Description   Final    BLOOD SITE NOT SPECIFIED Performed at [facility], [address], [place], [place] [address]    Special Requests   Final    BOTTLES DRAWN AEROBIC AND ANAEROBIC Blood Culture adequate volume Performed at [facility], [address] [place], [place] [address]    Culture   Final    NO GROWTH 3 DAYS Performed at [facility], [address], [place], [place] [address]    Report Status PENDING  Incomplete  Urine Culture     Status: Abnormal   Collection Time: 04/19/23  1:23 AM   Specimen: Urine, Random  Result Value Ref Range Status   Specimen Description   Final    URINE, RANDOM Performed at [facility], [address], [place], [place] [address]    Special Requests   Final    NONE Reflexed from [phone] Performed at [facility], [address]  [place], [place] [address]    Culture MULTIPLE SPECIES PRESENT, SUGGEST RECOLLECTION (Filip Luten)  Final   Report Status 04/20/2023 FINAL  Final  MRSA Next Gen by PCR, Nasal     Status: None   Collection Time: 04/19/23  4:43 AM   Specimen: Nasal Mucosa; Nasal Swab  Result Value Ref Range Status   MRSA by PCR Next Gen NOT DETECTED NOT  DETECTED Final    Comment: (NOTE) The GeneXpert MRSA Assay (FDA approved for NASAL specimens only), is one component of Dashley Monts comprehensive MRSA colonization surveillance program. It is not intended to diagnose MRSA infection nor to guide or monitor treatment for MRSA infections. Test performance is not FDA approved in patients less than 79 years old. Performed at [facility], [address] [place], [place] [address]   Respiratory (~20 pathogens) panel by PCR     Status: Abnormal   Collection Time: 04/19/23  1:06 PM   Specimen: Nasopharyngeal Swab; Respiratory  Result Value Ref Range Status   Adenovirus NOT DETECTED NOT DETECTED Final   Coronavirus 229E NOT DETECTED NOT DETECTED Final    Comment: (NOTE) The Coronavirus on the Respiratory Panel, DOES NOT test for the novel  Coronavirus (2019 nCoV)    Coronavirus HKU1 NOT DETECTED NOT DETECTED Final   Coronavirus NL63 NOT DETECTED NOT DETECTED Final   Coronavirus OC43 NOT DETECTED NOT DETECTED Final   Metapneumovirus NOT DETECTED NOT DETECTED Final   Rhinovirus / Enterovirus NOT DETECTED NOT DETECTED Final   Influenza Yer Olivencia NOT DETECTED NOT DETECTED Final   Influenza B NOT DETECTED NOT DETECTED Final   Parainfluenza Virus 1 NOT DETECTED NOT DETECTED Final   Parainfluenza Virus 2 NOT DETECTED NOT DETECTED Final   Parainfluenza Virus 3 DETECTED (Heraclio Seidman) NOT DETECTED Final   Parainfluenza Virus 4 NOT DETECTED NOT DETECTED Final   Respiratory Syncytial Virus NOT DETECTED NOT DETECTED Final   Bordetella pertussis NOT DETECTED NOT DETECTED Final   Bordetella Parapertussis NOT DETECTED NOT DETECTED Final    Chlamydophila pneumoniae NOT DETECTED NOT DETECTED Final   Mycoplasma pneumoniae NOT DETECTED NOT DETECTED Final    Comment: Performed at [facility], [address] [place], [place] [address]  SARS Coronavirus 2 by RT PCR (hospital order, performed in [facility] hospital lab) *cepheid single result test* Anterior Nasal Swab     Status: None   Collection Time: 04/19/23  1:06 PM   Specimen: Anterior Nasal Swab  Result Value Ref Range Status   SARS Coronavirus 2 by RT PCR NEGATIVE NEGATIVE Final    Comment: (NOTE) SARS-CoV-2 target nucleic acids are NOT DETECTED.  The SARS-CoV-2 RNA is generally detectable in upper and lower respiratory specimens during the acute phase of infection. The lowest concentration of SARS-CoV-2 viral copies this assay can detect is 250 copies / mL. Rhaelyn Giron negative result does not preclude SARS-CoV-2 infection and should not be used as the sole basis for treatment or other patient management decisions.  Pankaj Haack negative result may occur with improper specimen collection / handling, submission of specimen other than nasopharyngeal swab, presence of viral mutation(s) within the areas targeted by this assay, and inadequate number of viral copies (<250 copies / mL). Mendel Binsfeld negative result must be combined with clinical observations, patient history, and epidemiological information.  Fact Sheet for Patients:   RoadLapTop.co.za  Fact Sheet for Healthcare Providers: http://kim-miller.com/  This test is not yet approved or  cleared by the [place] FDA and has been authorized for detection and/or diagnosis of SARS-CoV-2 by FDA under an Emergency Use Authorization (EUA).  This EUA will remain in effect (meaning this test can be used) for the duration of the COVID-19 declaration under Section 564(b)(1) of the Act, 21 U.S.C. section 360bbb-3(b)(1), unless the authorization is terminated or revoked sooner.  Performed at [facility], [address], [place], [place] [address]  Labs: Basic Metabolic Panel: Recent Labs  Lab 04/18/23 2200 04/19/23 0541 04/21/23 0955 04/22/23 0252  NA 138 137 140 141  K 3.7 3.5 2.9* 4.0  CL 104 104 107 110  CO2 26 25 26 25   GLUCOSE 158* 197* 96 116*  BUN 18 19 23 23   CREATININE 1.41* 1.35* 1.32* 1.12*  CALCIUM 8.2* 7.9* 8.2* 8.0*  MG 3.0* 2.2  --  1.8  PHOS  --  3.6  --  3.4   Liver Function Tests: Recent Labs  Lab 04/18/23 2200 04/19/23 0541 04/22/23 0252  AST 22 17 16   ALT 14 14 14   ALKPHOS 74 60 47  BILITOT 0.5 0.4 0.4  PROT 7.3 6.4* 5.9*  ALBUMIN 2.9* 2.5* 2.3*   No results for input(s): "LIPASE", "AMYLASE" in the last 168 hours. No results for input(s): "AMMONIA" in the last 168 hours. CBC: Recent Labs  Lab 04/18/23 2200 04/19/23 0541 04/21/23 0955 04/22/23 0252  WBC 9.3 4.0 5.2 4.9  NEUTROABS 3.9 3.2  --  2.9  HGB 10.1* 8.8* 9.1* 8.7*  HCT 33.3* 29.3* 30.2* 28.8*  MCV 104.4* 104.3* 105.6* 104.7*  PLT 184 140* 149* 147*   Cardiac Enzymes: No results for input(s): "CKTOTAL", "CKMB", "CKMBINDEX", "TROPONINI" in the last 168 hours. BNP: BNP (last 3 results) Recent Labs    01/12/23 1845 03/06/23 2215 04/18/23 2200  BNP 315.2* 405.0* 456.3*    ProBNP (last 3 results) No results for input(s): "PROBNP" in the last 8760 hours.  CBG: No results for input(s): "GLUCAP" in the last 168 hours.     Signed:  Lacretia Nicks MD.  [facility] 04/22/2023, 2:06 PM

## 2023-04-22 NOTE — [place] FL2 (Signed)
[place] MEDICAID FL2 LEVEL OF CARE FORM     IDENTIFICATION  Patient Name: Crystal [name] Birthdate: 1946/06/20 Sex: female Admission Date (Current Location): 04/18/2023  [facility] and [place] Number:  Producer, television/film/video and Address:  [facility],  [address] [place], [place] [address]      Provider Number: [id number]  Attending Physician Name and Address:  Zigmund Daniel., *  Relative Name and Phone Number:  niece; Tommi Rumps @ [phone]    Current Level of Care: Hospital Recommended Level of Care: Assisted Living Facility Prior Approval Number:    Date Approved/Denied:   PASRR Number:    Discharge Plan: Other (Comment) (ALF)    Current Diagnoses: Patient Active Problem List   Diagnosis Date Noted   Acute hypoxic respiratory failure (HCC) 04/19/2023   HCAP (healthcare-associated pneumonia) 04/19/2023   Acute renal failure superimposed on stage 3a chronic kidney disease (HCC) 04/19/2023   Sterile pyuria 04/19/2023   Anemia of chronic disease 04/19/2023   History of pulmonary embolism 04/19/2023   Debility 03/21/2023   Postural dizziness with presyncope 03/07/2023   Frequent PVCs 03/07/2023   Thrombocytopenia (HCC) 03/07/2023   Anemia associated with chemotherapy 03/07/2023   UTI (urinary tract infection) 03/07/2023   Sinus tachycardia 03/06/2023   History of DVT (deep vein thrombosis) 03/06/2023   DNR (do not resuscitate)/DNI(Do Not Intubate) 03/06/2023   Pleural effusion, malignant 01/13/2023   Neutropenia (HCC) 12/08/2022   Recurrent right pleural effusion 11/16/2022   Acute pulmonary embolism (HCC) 11/12/2022   Acute hypoxemic respiratory failure (HCC) 11/12/2022   Metastatic non-small cell lung cancer (HCC) 11/12/2022   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   Rib fracture 11/12/2022   Anxiety 11/12/2022   CKD stage 3a, GFR 45-59 ml/min (HCC) - baseline SCr 0.9-1.1 11/12/2022   Chronic systolic CHF (congestive heart failure) (HCC)  11/12/2022   Metastasis to spinal column (HCC) 10/03/2022   Primary cancer of right middle lobe of lung (HCC) 09/29/2022   Intracranial bleed (HCC) 09/13/2022   Lung mass 09/13/2022   Hypokalemia 09/13/2022   Tachycardia 08/29/2021   Preop cardiovascular exam 08/29/2020   Symptomatic cholelithiasis 06/09/2020   S/P laparoscopic cholecystectomy 06/09/2020   Chest pain with moderate risk for cardiac etiology 02/14/2018   Right upper quadrant pain 02/12/2018   Tremor 02/11/2014   Non Hodgkin's lymphoma (HCC) 08/18/2013   Neck pain on left side 05/30/2013   Dizziness 05/30/2013   Moderate obesity 05/21/2013   Lung crackles 05/21/2013   Dyspnea on exertion 05/21/2013   Nonischemic cardiomyopathy (HCC)    Essential hypertension    Dyslipidemia, goal LDL below 100     Orientation RESPIRATION BLADDER Height & Weight     Self, Time, Situation, Place  O2 Continent Weight: 209 lb 3.5 oz (94.9 kg) Height:  5\' 5"  (165.1 cm)  BEHAVIORAL SYMPTOMS/MOOD NEUROLOGICAL BOWEL NUTRITION STATUS      Continent Diet (regular)  AMBULATORY STATUS COMMUNICATION OF NEEDS Skin   Supervision Verbally PU Stage and Appropriate Care   PU Stage 2 Dressing: No Dressing                   Personal Care Assistance Level of Assistance  Bathing, Dressing Bathing Assistance: Limited assistance Feeding assistance: Independent Dressing Assistance: Limited assistance     Functional Limitations Info  Sight, Hearing, Speech Sight Info: Adequate Hearing Info: Adequate Speech Info: Adequate    SPECIAL CARE FACTORS FREQUENCY  PT (By licensed PT), OT (By licensed OT)  PT Frequency: 3x/wk OT Frequency: 3x/wk            Contractures Contractures Info: Not present    Additional Factors Info  Code Status, Allergies Code Status Info: DNR Allergies Info: Simvastatin, Zoloft (Sertraline), Codeine, Coreg, Lisinopril, Tizanidine Hcl           Current Medications (04/22/2023):  This is the current  hospital active medication list Current Facility-Administered Medications  Medication Dose Route Frequency Provider Last Rate Last Admin   acetaminophen (TYLENOL) tablet 650 mg  650 mg Oral Q6H PRN Howerter, Justin B, DO       Or   acetaminophen (TYLENOL) suppository 650 mg  650 mg Rectal Q6H PRN Howerter, Justin B, DO       albuterol (PROVENTIL) (2.5 MG/3ML) 0.083% nebulizer solution 2.5 mg  2.5 mg Nebulization Q2H PRN Zigmund Daniel., MD   2.5 mg at 04/21/23 0411   apixaban (ELIQUIS) tablet 5 mg  5 mg Oral BID Howerter, Justin B, DO   5 mg at 04/22/23 0842   arformoterol (BROVANA) nebulizer solution 15 mcg  15 mcg Nebulization BID Zigmund Daniel., MD   15 mcg at 04/21/23 2021   azithromycin (ZITHROMAX) tablet 500 mg  500 mg Oral Daily Zigmund Daniel., MD   500 mg at 04/22/23 0842   budesonide (PULMICORT) nebulizer solution 0.25 mg  0.25 mg Nebulization BID Zigmund Daniel., MD   0.25 mg at 04/22/23 0803   cefTRIAXone (ROCEPHIN) 2 g in sodium chloride 0.9 % 100 mL IVPB  2 g Intravenous Q24H Zigmund Daniel., MD 200 mL/hr at 04/22/23 0143 2 g at 04/22/23 0143   Chlorhexidine Gluconate Cloth 2 % PADS 6 each  6 each Topical Daily Howerter, Justin B, DO   6 each at 04/22/23 1610   ferrous sulfate tablet 325 mg  325 mg Oral Q lunch Howerter, Justin B, DO   325 mg at 04/21/23 1217   folic acid (FOLVITE) tablet 1 mg  1 mg Oral Daily Howerter, Justin B, DO   1 mg at 04/22/23 0843   HYDROcodone bit-homatropine (HYCODAN) 5-1.5 MG/5ML syrup 5 mL  5 mL Oral Q6H PRN Howerter, Justin B, DO   5 mL at 04/21/23 2109   ipratropium-albuterol (DUONEB) 0.5-2.5 (3) MG/3ML nebulizer solution 3 mL  3 mL Nebulization TID Zigmund Daniel., MD   3 mL at 04/22/23 0803   LORazepam (ATIVAN) tablet 0.5 mg  0.5 mg Oral BID PRN Zigmund Daniel., MD   0.5 mg at 04/21/23 2109   melatonin tablet 3 mg  3 mg Oral QHS PRN Howerter, Justin B, DO   3 mg at 04/21/23 2109   metoprolol tartrate  (LOPRESSOR) tablet 50 mg  50 mg Oral TID Howerter, Justin B, DO   50 mg at 04/22/23 0842   ondansetron (ZOFRAN) injection 4 mg  4 mg Intravenous Q6H PRN Howerter, Justin B, DO       predniSONE (DELTASONE) tablet 40 mg  40 mg Oral Q breakfast Zigmund Daniel., MD   40 mg at 04/22/23 0810   sodium chloride flush (NS) 0.9 % injection 10-40 mL  10-40 mL Intracatheter Q12H Howerter, Justin B, DO   10 mL at 04/21/23 2109   sodium chloride flush (NS) 0.9 % injection 10-40 mL  10-40 mL Intracatheter PRN Howerter, Chaney Born, DO         Discharge Medications: Please see discharge summary for a list of  discharge medications.  Relevant Imaging Results:  Relevant Lab Results:   Additional Information SS# [id number]  Amada Jupiter, LCSW

## 2023-04-23 ENCOUNTER — Telehealth: Payer: Self-pay | Admitting: Oncology

## 2023-04-23 ENCOUNTER — Inpatient Hospital Stay: Payer: No Typology Code available for payment source

## 2023-04-23 ENCOUNTER — Inpatient Hospital Stay: Payer: No Typology Code available for payment source | Admitting: Nurse Practitioner

## 2023-04-23 ENCOUNTER — Encounter: Payer: Self-pay | Admitting: *Deleted

## 2023-04-23 MED ORDER — HYDROCODONE BIT-HOMATROP MBR 5-1.5 MG/5ML PO SOLN: 5.0000 mL | Freq: Two times a day (BID) | ORAL | 0 refills | Status: AC | PRN

## 2023-04-23 MED ORDER — HEPARIN SOD (PORK) LOCK FLUSH 100 UNIT/ML IV SOLN
250.0000 [IU] | INTRAVENOUS | Status: AC | PRN
  Administered 2023-04-23 (×2): 250 [IU]

## 2023-04-23 MED ORDER — PREDNISONE 20 MG PO TABS: ORAL_TABLET | ORAL | 0 refills | Status: AC

## 2023-04-23 MED ORDER — AMOXICILLIN 500 MG PO CAPS: 1000.0000 mg | ORAL_CAPSULE | Freq: Three times a day (TID) | ORAL | 0 refills | Status: AC

## 2023-04-23 NOTE — TOC Initial Note (Deleted)
Transition of Care [facility]) - Initial/Assessment Note    Patient Details  Name: Crystal [name] MRN: [id number] Date of Birth: February 16, 1946  Transition of Care [facility]) CM/SW Contact:    Howell Rucks, RN Phone Number: 04/23/2023, 11:30 AM  Clinical Narrative:                   Expected Discharge Plan: Assisted Living Barriers to Discharge: Barriers Resolved   Patient Goals and CMS Choice Patient states their goals for this hospitalization and ongoing recovery are:: Return to [facility] with [facility] CMS Medicare.gov Compare Post Acute Care list provided to:: Patient Choice offered to / list presented to : Patient      Expected Discharge Plan and Services In-house Referral: NA Discharge Planning Services: NA Post Acute Care Choice: NA Living arrangements for the past 2 months: Assisted Living Facility [facility]) Expected Discharge Date: 04/23/23               DME Arranged: N/A DME Agency: NA       HH Arranged: NA HH Agency: [facility] Date HH Agency Contacted: 04/23/23 Time HH Agency Contacted: 1109 Representative spoke with at [facility] Agency: Tresa Endo  Prior Living Arrangements/Services Living arrangements for the past 2 months: Assisted Living Facility ([facility]) Lives with:: Facility Resident Patient language and need for interpreter reviewed:: Yes Do you feel safe going back to the place where you live?: Yes      Need for Family Participation in Patient Care: No (Comment) Care giver support system in place?: Yes (comment) Current home services: Other (comment) (o2 PRN) Criminal Activity/Legal Involvement Pertinent to Current Situation/Hospitalization: No - Comment as needed  Activities of Daily Living Home Assistive Devices/Equipment: Eyeglasses, Environmental consultant (specify type), Wheelchair ADL Screening (condition at time of admission) Patient's cognitive ability adequate to safely complete daily activities?: Yes Is the patient deaf or have difficulty hearing?:  No Does the patient have difficulty seeing, even when wearing glasses/contacts?: No Does the patient have difficulty concentrating, remembering, or making decisions?: No Patient able to express need for assistance with ADLs?: Yes Does the patient have difficulty dressing or bathing?: No Independently performs ADLs?: Yes (appropriate for developmental age) Does the patient have difficulty walking or climbing stairs?: No Weakness of Legs: Both Weakness of Arms/Hands: None  Permission Sought/Granted Permission sought to share information with : Facility Medical sales representative, Family Supports Permission granted to share information with : Yes, Verbal Permission Granted  Share Information with NAME: Erskine Squibb  Permission granted to share info w AGENCY: [facility]  Permission granted to share info w Relationship: Sister     Emotional Assessment Appearance:: Appears stated age Attitude/Demeanor/Rapport: Engaged Affect (typically observed): Appropriate Orientation: : Oriented to Self, Oriented to Place, Oriented to  Time, Oriented to Situation Alcohol / Substance Use: Not Applicable Psych Involvement: No (comment)  Admission diagnosis:  Shortness of breath [R06.02] Acute hypoxic respiratory failure (HCC) [J96.01] Patient Active Problem List   Diagnosis Date Noted   Acute hypoxic respiratory failure (HCC) 04/19/2023   HCAP (healthcare-associated pneumonia) 04/19/2023   Acute renal failure superimposed on stage 3a chronic kidney disease (HCC) 04/19/2023   Sterile pyuria 04/19/2023   Anemia of chronic disease 04/19/2023   History of pulmonary embolism 04/19/2023   Debility 03/21/2023   Postural dizziness with presyncope 03/07/2023   Frequent PVCs 03/07/2023   Thrombocytopenia (HCC) 03/07/2023   Anemia associated with chemotherapy 03/07/2023   UTI (urinary tract infection) 03/07/2023   Sinus tachycardia 03/06/2023  History of DVT (deep vein thrombosis) 03/06/2023   DNR (do not  resuscitate)/DNI(Do Not Intubate) 03/06/2023   Pleural effusion, malignant 01/13/2023   Neutropenia (HCC) 12/08/2022   Recurrent right pleural effusion 11/16/2022   Acute pulmonary embolism (HCC) 11/12/2022   Acute hypoxemic respiratory failure (HCC) 11/12/2022   Metastatic non-small cell lung cancer (HCC) 11/12/2022   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   Rib fracture 11/12/2022   Anxiety 11/12/2022   CKD stage 3a, GFR 45-59 ml/min (HCC) - baseline SCr 0.9-1.1 11/12/2022   Chronic systolic CHF (congestive heart failure) (HCC) 11/12/2022   Metastasis to spinal column (HCC) 10/03/2022   Primary cancer of right middle lobe of lung (HCC) 09/29/2022   Intracranial bleed (HCC) 09/13/2022   Lung mass 09/13/2022   Hypokalemia 09/13/2022   Tachycardia 08/29/2021   Preop cardiovascular exam 08/29/2020   Symptomatic cholelithiasis 06/09/2020   S/P laparoscopic cholecystectomy 06/09/2020   Chest pain with moderate risk for cardiac etiology 02/14/2018   Right upper quadrant pain 02/12/2018   Tremor 02/11/2014   Non Hodgkin's lymphoma (HCC) 08/18/2013   Neck pain on left side 05/30/2013   Dizziness 05/30/2013   Moderate obesity 05/21/2013   Lung crackles 05/21/2013   Dyspnea on exertion 05/21/2013   Nonischemic cardiomyopathy (HCC)    Essential hypertension    Dyslipidemia, goal LDL below 100    PCP:  Blair Heys, MD Pharmacy:   [id number] [place], [place] - [address] AT [facility] OF [facility] & [address] [place] [address] Phone: [phone] Fax: [phone]     Social Determinants of Health (SDOH) Social History: SDOH Screenings   Food Insecurity: No Food Insecurity (04/19/2023)  Housing: Low Risk  (04/19/2023)  Transportation Needs: No Transportation Needs (04/19/2023)  Utilities: Not At Risk (04/19/2023)  Depression (PHQ2-9): Low Risk  (03/02/2023)  Tobacco Use: Low Risk  (04/19/2023)   SDOH Interventions:      Readmission Risk Interventions    04/23/2023   11:08 AM 03/09/2023   12:06 PM  Readmission Risk Prevention Plan  Transportation Screening Complete Complete  Medication Review Oceanographer) Complete Complete  PCP or Specialist appointment within 3-5 days of discharge Complete Complete  HRI or Home Care Consult Complete Complete  SW Recovery Care/Counseling Consult Complete Complete  Palliative Care Screening Not Applicable Not Applicable  Skilled Nursing Facility Complete Not Applicable

## 2023-04-23 NOTE — Plan of Care (Signed)
Pt alert and oriented x 4. Up independently in room. Pt denied pain this shift. NO prns given. Vitals stable. Pt afebrile. Possible d/c today noted from SW notes.  Problem: Education: Goal: Knowledge of General Education information will improve Description: Including pain rating scale, medication(s)/side effects and non-pharmacologic comfort measures Outcome: Progressing   Problem: Health Behavior/Discharge Planning: Goal: Ability to manage health-related needs will improve Outcome: Progressing   Problem: Clinical Measurements: Goal: Ability to maintain clinical measurements within normal limits will improve Outcome: Progressing Goal: Will remain free from infection Outcome: Progressing Goal: Diagnostic test results will improve Outcome: Progressing Goal: Respiratory complications will improve Outcome: Progressing Goal: Cardiovascular complication will be avoided Outcome: Progressing   Problem: Activity: Goal: Risk for activity intolerance will decrease Outcome: Progressing   Problem: Nutrition: Goal: Adequate nutrition will be maintained Outcome: Progressing   Problem: Coping: Goal: Level of anxiety will decrease Outcome: Progressing   Problem: Elimination: Goal: Will not experience complications related to bowel motility Outcome: Progressing Goal: Will not experience complications related to urinary retention Outcome: Progressing   Problem: Pain Managment: Goal: General experience of comfort will improve Outcome: Progressing   Problem: Safety: Goal: Ability to remain free from injury will improve Outcome: Progressing   Problem: Skin Integrity: Goal: Risk for impaired skin integrity will decrease Outcome: Progressing

## 2023-04-23 NOTE — Progress Notes (Addendum)
Patient received discharge orders to go back to [facility]. RN called [facility] and was able to speak to Brenesha at [facility] to give report on patient. Patient was given discharge paperwork/instructions. RN went over discharge paperwork/instructions with the patient. Patient left the hospital stable with double lumen PICC, had discharge paperwork/instructions, and had all personal belongings. Patient left the hospital with a family member to go back to [facility].

## 2023-04-23 NOTE — Telephone Encounter (Signed)
Left patient a vm regarding upcoming appointment  

## 2023-04-23 NOTE — Progress Notes (Signed)
Per Dr. Truett Perna: Needs lab/flush/OV/chemo on 7/5 or next week. Scheduling chat message sent.

## 2023-04-23 NOTE — TOC Transition Note (Addendum)
Transition of Care [facility]) - CM/SW Discharge Note   Patient Details  Name: Crystal [name] MRN: [id number] Date of Birth: 10-15-1946  Transition of Care [facility]) CM/SW Contact:  Howell Rucks, RN Phone Number: 04/23/2023, 11:10 AM   Clinical Narrative:  DC order today, pt to return to [facility], [facility] PT/OT/HHA through [facility]. Pt confirmed her niece will provide transportation.   -11:13am Call to [facility], sw Lurena Joiner, confirmed - RM 225. Nurse Call Report: [phone] (facility main number). No further TOC needs identified.   -11:38am Call to pt's niece - Tommi Rumps, at [phone], left vm informing pt has been discharged and is ready for pick up.   -11:42am Call received from [place], states she will call pt's sister, Erskine Squibb, for transport.    Final next level of care: Assisted Living Barriers to Discharge: Barriers Resolved   Patient Goals and CMS Choice CMS Medicare.gov Compare Post Acute Care list provided to:: Patient Choice offered to / list presented to : Patient  Discharge Placement                         Discharge Plan and Services Additional resources added to the After Visit Summary for   In-house Referral: NA Discharge Planning Services: NA Post Acute Care Choice: NA          DME Arranged: N/A DME Agency: NA       HH Arranged: NA HH Agency: [facility] Date HH Agency Contacted: 04/23/23 Time HH Agency Contacted: 1109 Representative spoke with at [facility] Agency: Tresa Endo  Social Determinants of Health (SDOH) Interventions SDOH Screenings   Food Insecurity: No Food Insecurity (04/19/2023)  Housing: Low Risk  (04/19/2023)  Transportation Needs: No Transportation Needs (04/19/2023)  Utilities: Not At Risk (04/19/2023)  Depression (PHQ2-9): Low Risk  (03/02/2023)  Tobacco Use: Low Risk  (04/19/2023)     Readmission Risk Interventions    04/23/2023   11:08 AM 03/09/2023   12:06 PM  Readmission Risk Prevention Plan  Transportation Screening  Complete Complete  Medication Review Oceanographer) Complete Complete  PCP or Specialist appointment within 3-5 days of discharge Complete Complete  HRI or Home Care Consult Complete Complete  SW Recovery Care/Counseling Consult Complete Complete  Palliative Care Screening Not Applicable Not Applicable  Skilled Nursing Facility Complete Not Applicable

## 2023-04-23 NOTE — Discharge Summary (Addendum)
Physician Discharge Summary  Crystal [name] ZOX:[id number] DOB: 1946-07-19 DOA: 04/18/2023  PCP: Blair Heys, MD  Admit date: 04/18/2023 Discharge date: 04/23/2023  Time spent: 40 minutes  Recommendations for Outpatient Follow-up:  Follow outpatient CBC/CMP  Follow repeat chest imaging outpatient Follow with oncology outpatient   Discharge Diagnoses:  Principal Problem:   HCAP (healthcare-associated pneumonia) Active Problems:   Essential hypertension   Metastatic non-small cell lung cancer (HCC)   Chronic systolic CHF (congestive heart failure) (HCC)   Acute hypoxic respiratory failure (HCC)   Acute renal failure superimposed on stage 3a chronic kidney disease (HCC)   Sterile pyuria   Anemia of chronic disease   History of pulmonary embolism   Discharge Condition: stable  Diet recommendation: heart healthy  Filed Weights   04/21/23 0600 04/22/23 0600 04/23/23 0500  Weight: 94.7 kg 94.9 kg 92.3 kg    History of present illness:   Crystal [name] is Crystal [name] 77 y.o. female with medical history significant for metastatic non-small cell lung cancer, essential pretension, anemia of chronic disease associated baseline hemoglobin 8-10, MSSA bacteremia in March 2024, pulmonary embolism, CKD 3 Chevi Lim-CYS with baseline creatinine 1.1, chronic systolic heart failure, who is admitted to [facility] on 04/18/2023 with HCAP pneumonia after presenting from home to [facility] ED complaining of shortness of breath.    She was admitted with acute hypoxic respiratory failure due to community acquired pneumonia and parainfluenza virus infection.  Gradually improved with supportive care.  Stable for discharge 6/30, but ALF couldn't take her back on Sunday.   See below for additional details  Hospital Course:  Assessment and Plan:  Acute Hypoxic Respiratory Failure  Community Acquired Pneumonia Parainfluenza Virus Infection Requiring 3 L  -> improved to RA today CT chest with diffuse  peribronchovascular tree in bud nodularity and diffuse bronchial wall thickening and debris within the trachea Negative MRSA pcr, negative urine strep Pending blood cultures, urine legionella, sputum culture Negative covid RVP with parainfluenza virus  Narrow abx to ceftriaxone/azithromycin prednisone given wheezing on exam  SLP eval with findings concerning for aspiration -> recommending regular/thin liquid  Continued SOB today, will follow with conservative management   AKI on CKD IIIb Mild, follow   Metastatic Non Small Cell Lung Cancer CT with stable 3.8x2.9 cm right middle lobe central mass, interval increase in size of mediastinal LAD, stable R hilar LAD Follows with Dr. Truett Perna outpatient - being treated with atezolizumab   Asymptomatic Pyuria No si/sx UTI    HTN metoprolol   Hx PE Continue eliquis   HFmrEF Appears euvolemic    Hypokalemic Replace and follow       Procedures: none   Consultations: none  Discharge Exam: Vitals:   04/23/23 0731 04/23/23 0941  BP:  130/79  Pulse:  (!) 110  Resp:    Temp:    SpO2: 96%    Eager to discharge No new complaints  General: No acute distress. Lungs: resting unlabored on RA Neurological: Alert and oriented 3. Moves all extremities 4 with equal strength. Cranial nerves II through XII grossly intact. Extremities: No clubbing or cyanosis. No edema.  Discharge Instructions   Discharge Instructions     Call MD for:  difficulty breathing, headache or visual disturbances   Complete by: As directed    Call MD for:  extreme fatigue   Complete by: As directed    Call MD for:  hives   Complete by: As directed    Call MD for:  persistant dizziness  or light-headedness   Complete by: As directed    Call MD for:  persistant nausea and vomiting   Complete by: As directed    Call MD for:  redness, tenderness, or signs of infection (pain, swelling, redness, odor or green/yellow discharge around incision site)    Complete by: As directed    Call MD for:  severe uncontrolled pain   Complete by: As directed    Call MD for:  temperature >100.4   Complete by: As directed    Diet - low sodium heart healthy   Complete by: As directed    Discharge instructions   Complete by: As directed    You were seen for shortness of breath.  You had Crystal [name] parainfluenza virus infection.  You've improved with steroids.  We'll send you home with steroids and Sharnell Knight couple more days of antibiotics just in case there's Stevan Eberwein bacterial infection.  Follow up with your PCP as an outpatient.  Follow up with your cancer doctor as an outpatient.  Return for new, recurrent, or worsening symptoms.  Please ask your PCP to request records from this hospitalization so they know what was done and what the next steps will be.   Increase activity slowly   Complete by: As directed    No wound care   Complete by: As directed       Allergies as of 04/23/2023       Reactions   Simvastatin Other (See Comments)   Leg cramps   Zoloft [sertraline] Nausea Only   Codeine Other (See Comments)   Bad Headaches   Coreg Other (See Comments)   "Made my legs hurt"   Lisinopril Cough   Tizanidine Hcl Rash, Other (See Comments)   Hypotension, also        Medication List     TAKE these medications    amoxicillin 500 MG capsule Commonly known as: AMOXIL Take 2 capsules (1,000 mg total) by mouth 3 (three) times daily for 3 days.   apixaban 5 MG Tabs tablet Commonly known as: Eliquis Take 1 tablet (5 mg total) by mouth 2 (two) times daily.   ferrous sulfate 325 (65 FE) MG tablet Take 1 tablet (325 mg total) by mouth daily with lunch.   folic acid 1 MG tablet Commonly known as: FOLVITE Take 1 tablet (1 mg total) by mouth daily.   HYDROcodone bit-homatropine 5-1.5 MG/5ML syrup Commonly known as: HYCODAN Take 5 mLs by mouth 2 (two) times daily as needed for up to 3 days for cough. What changed: when to take this   LORazepam 0.5 MG  tablet Commonly known as: ATIVAN Take 1 tablet (0.5 mg total) by mouth 2 (two) times daily as needed for anxiety.   magnesium oxide 400 (240 Mg) MG tablet Commonly known as: MAG-OX Take 1 tablet (400 mg total) by mouth 2 (two) times daily.   metoprolol tartrate 50 MG tablet Commonly known as: LOPRESSOR Take 1 tablet (50 mg total) by mouth 3 (three) times daily.   potassium chloride SA 20 MEQ tablet Commonly known as: KLOR-CON M Take 1 tablet (20 mEq total) by mouth daily.   predniSONE 20 MG tablet Commonly known as: DELTASONE Take 2 tablets (40 mg total) by mouth daily with breakfast for 1 day, THEN 1.5 tablets (30 mg total) daily with breakfast for 2 days, THEN 1 tablet (20 mg total) daily with breakfast for 2 days, THEN 0.5 tablets (10 mg total) daily with breakfast for 2 days. Start taking on:  April 23, 2023       Allergies  Allergen Reactions   Simvastatin Other (See Comments)    Leg cramps   Zoloft [Sertraline] Nausea Only   Codeine Other (See Comments)    Bad Headaches   Coreg Other (See Comments)    "Made my legs hurt"   Lisinopril Cough   Tizanidine Hcl Rash and Other (See Comments)    Hypotension, also      The results of significant diagnostics from this hospitalization (including imaging, microbiology, ancillary and laboratory) are listed below for reference.    Significant Diagnostic Studies: CT Angio Chest PE W and/or Wo Contrast  Result Date: 04/19/2023 CLINICAL DATA:  Pulmonary embolism (PE) suspected, low to intermediate prob, positive D-dimer Pt has stage 4 lung cancer. Pt states hx of clots; takes eliquis EXAM: CT ANGIOGRAPHY CHEST WITH CONTRAST TECHNIQUE: Multidetector CT imaging of the chest was performed using the standard protocol during bolus administration of intravenous contrast. Multiplanar CT image reconstructions and MIPs were obtained to evaluate the vascular anatomy. RADIATION DOSE REDUCTION: This exam was performed according to the  departmental dose-optimization program which includes automated exposure control, adjustment of the mA and/or kV according to patient size and/or use of iterative reconstruction technique. CONTRAST:  80mL OMNIPAQUE IOHEXOL 350 MG/ML SOLN COMPARISON:  CT angio chest 03/05/2023 FINDINGS: Cardiovascular: Satisfactory opacification of the pulmonary arteries to the segmental level. No evidence of pulmonary embolism. Normal heart size. No significant pericardial effusion. The thoracic aorta is normal in caliber. Mild atherosclerotic plaque of the thoracic aorta. At least 3 vessel coronary artery calcifications. Mediastinum/Nodes: Stable 1 cm right hilar lymph node. Interval increase in size of Thijs Brunton 1.2 cm (from 0.9 cm ) subcarinal lymph node. Interval increase in size of prominent but nonenlarged mediastinal lymph nodes. No axillary lymph nodes. Debris within the trachea. Thyroid gland, trachea, and esophagus demonstrate no significant findings. Lungs/Pleura: Diffuse bronchial wall thickening. Interval development of diffuse peribronchovascular tree-in-bud nodularity most prominent along the left upper lobe. Grossly stable 3.8 x 2.9 cm right middle lobe central mass. No pulmonary nodule. No pulmonary mass. No pleural effusion. No pneumothorax. Upper Abdomen: No acute abnormality. Musculoskeletal: No chest wall abnormality. Surgical changes along the right breast. Redemonstration of right upper breast 1.3 x 1 cm fluid dense lesion (4:23). No suspicious lytic or blastic osseous lesions. Chronic left lateral ninth rib fracture. Multilevel degenerative changes of the spine. Review of the MIP images confirms the above findings. IMPRESSION: 1. No pulmonary embolus. 2. Interval development of diffuse peribronchovascular tree-in-bud nodularity most prominent along the left upper lobe. Finding consistent with infection. 3. Diffuse bronchial wall thickening and debris within the trachea. 4. Grossly stable 3.8 x 2.9 cm right middle  lobe central mass. 5. Interval increase in size of mediastinal lymphadenopathy. Stable right hilar lymphadenopathy. Electronically Signed   By: Tish Frederickson M.D.   On: 04/19/2023 01:02   DG Chest Port 1 View  Result Date: 04/18/2023 CLINICAL DATA:  Dyspnea, lung cancer EXAM: PORTABLE CHEST 1 VIEW COMPARISON:  04/16/2023 FINDINGS: Stable focal consolidation within the right mid lung zone. Stable mild right-sided volume loss. Left lung is clear. No pneumothorax or pleural effusion. Left upper extremity PICC line tip is unchanged within the superior vena cava. Cardiac size within normal limits. Surgical clips again noted within the right axilla. No acute bone abnormality. IMPRESSION: 1. Stable focal consolidation within the right mid lung zone. No radiographic evidence of acute cardiopulmonary disease. Electronically Signed   By: Lyda Kalata.D.  On: 04/18/2023 22:22   DG Chest 2 View  Result Date: 04/16/2023 CLINICAL DATA:  lung cancer, cough/congestion, low grade fever EXAM: CHEST - 2 VIEW COMPARISON:  Chest x-ray March 26, 2023. CT of the chest Mar 05, 2023. FINDINGS: Similar band like opacity in the right lower lung. Left lung is clear. Left PICC terminates near the superior cavoatrial junction. No visible pleural effusions or pneumothorax. IMPRESSION: Similar band like opacity in the right lower lung, likely representing the known mass and postobstructive atelectasis and/or scarring. Superimposed infection is difficult to exclude. Electronically Signed   By: Feliberto Harts M.D.   On: 04/16/2023 14:24   DG Chest 2 View  Result Date: 03/26/2023 CLINICAL DATA:  Follow-up exam. EXAM: CHEST - 2 VIEW COMPARISON:  03/06/2023. FINDINGS: Unchanged left upper extremity PICC with tip projecting over the superior cavoatrial junction. Unchanged bandlike opacities in the right lower lung, favored to reflect atelectasis and/or scarring. No consolidation or pulmonary edema. Stable cardiac and mediastinal  contours. No pleural effusion or pneumothorax. IMPRESSION: Unchanged bandlike opacities in the right lower lung, favored to reflect atelectasis and/or scarring. Electronically Signed   By: Orvan Falconer M.D.   On: 03/26/2023 16:16    Microbiology: Recent Results (from the past 240 hour(s))  Culture, blood (Routine x 2)     Status: None (Preliminary result)   Collection Time: 04/18/23 10:27 PM   Specimen: BLOOD  Result Value Ref Range Status   Specimen Description   Final    BLOOD SITE NOT SPECIFIED Performed at [facility], [address], [place], [place] [address]    Special Requests   Final    BOTTLES DRAWN AEROBIC AND ANAEROBIC Blood Culture adequate volume Performed at [facility] [place], [address], [place], [place] [address]    Culture   Final    NO GROWTH 3 DAYS Performed at [facility], [address] [place], [place] [address]    Report Status PENDING  Incomplete  Culture, blood (Routine x 2)     Status: None (Preliminary result)   Collection Time: 04/18/23 10:45 PM   Specimen: BLOOD  Result Value Ref Range Status   Specimen Description   Final    BLOOD SITE NOT SPECIFIED Performed at [facility], [address], [place], [place] [address]    Special Requests   Final    BOTTLES DRAWN AEROBIC AND ANAEROBIC Blood Culture adequate volume Performed at [facility], [address] [place], [place] [address]    Culture   Final    NO GROWTH 3 DAYS Performed at [facility], [address], [place], [place] [address]    Report Status PENDING  Incomplete  Urine Culture     Status: Abnormal   Collection Time: 04/19/23  1:23 AM   Specimen: Urine, Random  Result Value Ref Range Status   Specimen Description   Final    URINE, RANDOM Performed at [facility], [address], [place], [place] [address]    Special Requests   Final    NONE Reflexed from  [id number] Performed at [facility], [address], [place], [place] [address]    Culture MULTIPLE SPECIES PRESENT, SUGGEST RECOLLECTION (Chrissie Dacquisto)  Final   Report Status 04/20/2023 FINAL  Final  MRSA Next Gen by PCR, Nasal     Status: None   Collection Time: 04/19/23  4:43 AM   Specimen: Nasal Mucosa; Nasal Swab  Result  Value Ref Range Status   MRSA by PCR Next Gen NOT DETECTED NOT DETECTED Final    Comment: (NOTE) The GeneXpert MRSA Assay (FDA approved for NASAL specimens only), is one component of Elton Heid comprehensive MRSA colonization surveillance program. It is not intended to diagnose MRSA infection nor to guide or monitor treatment for MRSA infections. Test performance is not FDA approved in patients less than 22 years old. Performed at [facility], [address] [place], [place] [address]   Respiratory (~20 pathogens) panel by PCR     Status: Abnormal   Collection Time: 04/19/23  1:06 PM   Specimen: Nasopharyngeal Swab; Respiratory  Result Value Ref Range Status   Adenovirus NOT DETECTED NOT DETECTED Final   Coronavirus 229E NOT DETECTED NOT DETECTED Final    Comment: (NOTE) The Coronavirus on the Respiratory Panel, DOES NOT test for the novel  Coronavirus (2019 nCoV)    Coronavirus HKU1 NOT DETECTED NOT DETECTED Final   Coronavirus NL63 NOT DETECTED NOT DETECTED Final   Coronavirus OC43 NOT DETECTED NOT DETECTED Final   Metapneumovirus NOT DETECTED NOT DETECTED Final   Rhinovirus / Enterovirus NOT DETECTED NOT DETECTED Final   Influenza Natalina Wieting NOT DETECTED NOT DETECTED Final   Influenza B NOT DETECTED NOT DETECTED Final   Parainfluenza Virus 1 NOT DETECTED NOT DETECTED Final   Parainfluenza Virus 2 NOT DETECTED NOT DETECTED Final   Parainfluenza Virus 3 DETECTED (Christmas Faraci) NOT DETECTED Final   Parainfluenza Virus 4 NOT DETECTED NOT DETECTED Final   Respiratory Syncytial Virus NOT DETECTED NOT DETECTED Final   Bordetella pertussis NOT DETECTED NOT  DETECTED Final   Bordetella Parapertussis NOT DETECTED NOT DETECTED Final   Chlamydophila pneumoniae NOT DETECTED NOT DETECTED Final   Mycoplasma pneumoniae NOT DETECTED NOT DETECTED Final    Comment: Performed at [facility], [address], [place], [place] [address]  SARS Coronavirus 2 by RT PCR (hospital order, performed in [facility] hospital lab) *cepheid single result test* Anterior Nasal Swab     Status: None   Collection Time: 04/19/23  1:06 PM   Specimen: Anterior Nasal Swab  Result Value Ref Range Status   SARS Coronavirus 2 by RT PCR NEGATIVE NEGATIVE Final    Comment: (NOTE) SARS-CoV-2 target nucleic acids are NOT DETECTED.  The SARS-CoV-2 RNA is generally detectable in upper and lower respiratory specimens during the acute phase of infection. The lowest concentration of SARS-CoV-2 viral copies this assay can detect is 250 copies / mL. Tamel Abel negative result does not preclude SARS-CoV-2 infection and should not be used as the sole basis for treatment or other patient management decisions.  Shiree Altemus negative result may occur with improper specimen collection / handling, submission of specimen other than nasopharyngeal swab, presence of viral mutation(s) within the areas targeted by this assay, and inadequate number of viral copies (<250 copies / mL). Madalen Gavin negative result must be combined with clinical observations, patient history, and epidemiological information.  Fact Sheet for Patients:   RoadLapTop.co.za  Fact Sheet for Healthcare Providers: http://kim-miller.com/  This test is not yet approved or  cleared by the [place] FDA and has been authorized for detection and/or diagnosis of SARS-CoV-2 by FDA under an Emergency Use Authorization (EUA).  This EUA will remain in effect (meaning this test can be used) for the duration of the COVID-19 declaration under Section 564(b)(1) of the Act, 21 U.S.C. section 360bbb-3(b)(1),  unless the authorization is terminated or revoked sooner.  Performed at [facility], [address] [place], [place] [address]      Labs: Basic Metabolic Panel: Recent Labs  Lab 04/18/23 2200 04/19/23 0541 04/21/23 0955 04/22/23 0252  NA 138 137 140 141  K 3.7 3.5 2.9* 4.0  CL 104 104 107 110  CO2 26 25 26 25   GLUCOSE 158* 197* 96 116*  BUN 18 19 23 23   CREATININE 1.41* 1.35* 1.32* 1.12*  CALCIUM 8.2* 7.9* 8.2* 8.0*  MG 3.0* 2.2  --  1.8  PHOS  --  3.6  --  3.4   Liver Function Tests: Recent Labs  Lab 04/18/23 2200 04/19/23 0541 04/22/23 0252  AST 22 17 16   ALT 14 14 14   ALKPHOS 74 60 47  BILITOT 0.5 0.4 0.4  PROT 7.3 6.4* 5.9*  ALBUMIN 2.9* 2.5* 2.3*   No results for input(s): "LIPASE", "AMYLASE" in the last 168 hours. No results for input(s): "AMMONIA" in the last 168 hours. CBC: Recent Labs  Lab 04/18/23 2200 04/19/23 0541 04/21/23 0955 04/22/23 0252  WBC 9.3 4.0 5.2 4.9  NEUTROABS 3.9 3.2  --  2.9  HGB 10.1* 8.8* 9.1* 8.7*  HCT 33.3* 29.3* 30.2* 28.8*  MCV 104.4* 104.3* 105.6* 104.7*  PLT 184 140* 149* 147*   Cardiac Enzymes: No results for input(s): "CKTOTAL", "CKMB", "CKMBINDEX", "TROPONINI" in the last 168 hours. BNP: BNP (last 3 results) Recent Labs    01/12/23 1845 03/06/23 2215 04/18/23 2200  BNP 315.2* 405.0* 456.3*    ProBNP (last 3 results) No results for input(s): "PROBNP" in the last 8760 hours.  CBG: No results for input(s): "GLUCAP" in the last 168 hours.     Signed:  Lacretia Nicks MD.  [facility] 04/23/2023, 10:41 AM

## 2023-04-23 NOTE — Plan of Care (Signed)

## 2023-04-24 ENCOUNTER — Ambulatory Visit: Payer: No Typology Code available for payment source | Admitting: Nurse Practitioner

## 2023-04-24 LAB — CULTURE, BLOOD (ROUTINE X 2)
Culture: NO GROWTH
Special Requests: ADEQUATE

## 2023-04-24 LAB — LEGIONELLA PNEUMOPHILA SEROGP 1 UR AG: L. pneumophila Serogp 1 Ur Ag: NEGATIVE

## 2023-04-25 ENCOUNTER — Telehealth: Payer: Self-pay | Admitting: *Deleted

## 2023-04-25 NOTE — Telephone Encounter (Signed)
Patient called to inquire if she has chemo scheduled for this week. Made her aware it is on 05/04/23 (could not accommodate her this week, but OK per Dr. Truett Perna).

## 2023-04-27 ENCOUNTER — Telehealth: Payer: Self-pay

## 2023-04-27 ENCOUNTER — Telehealth: Payer: Self-pay | Admitting: Oncology

## 2023-04-27 NOTE — Telephone Encounter (Signed)
Scheduled appointment per 7/5 secure chat. Patient is aware of the made appointment.

## 2023-04-27 NOTE — Telephone Encounter (Signed)
Spoke to pt regarding PICC line dressing changes. Pt verbalizes understanding that scheduling will call her with appointment times

## 2023-04-28 DIAGNOSIS — J9601 Acute respiratory failure with hypoxia: Secondary | ICD-10-CM | POA: Diagnosis not present

## 2023-04-28 DIAGNOSIS — R8281 Pyuria: Secondary | ICD-10-CM | POA: Diagnosis not present

## 2023-04-28 DIAGNOSIS — I130 Hypertensive heart and chronic kidney disease with heart failure and stage 1 through stage 4 chronic kidney disease, or unspecified chronic kidney disease: Secondary | ICD-10-CM | POA: Diagnosis not present

## 2023-04-28 DIAGNOSIS — I5022 Chronic systolic (congestive) heart failure: Secondary | ICD-10-CM | POA: Diagnosis not present

## 2023-04-28 DIAGNOSIS — N179 Acute kidney failure, unspecified: Secondary | ICD-10-CM | POA: Diagnosis not present

## 2023-04-28 DIAGNOSIS — C342 Malignant neoplasm of middle lobe, bronchus or lung: Secondary | ICD-10-CM | POA: Diagnosis not present

## 2023-04-28 DIAGNOSIS — Z7901 Long term (current) use of anticoagulants: Secondary | ICD-10-CM | POA: Diagnosis not present

## 2023-04-28 DIAGNOSIS — J189 Pneumonia, unspecified organism: Secondary | ICD-10-CM | POA: Diagnosis not present

## 2023-04-28 DIAGNOSIS — E876 Hypokalemia: Secondary | ICD-10-CM | POA: Diagnosis not present

## 2023-04-28 DIAGNOSIS — C8590 Non-Hodgkin lymphoma, unspecified, unspecified site: Secondary | ICD-10-CM | POA: Diagnosis not present

## 2023-04-28 DIAGNOSIS — N1832 Chronic kidney disease, stage 3b: Secondary | ICD-10-CM | POA: Diagnosis not present

## 2023-04-28 DIAGNOSIS — I629 Nontraumatic intracranial hemorrhage, unspecified: Secondary | ICD-10-CM | POA: Diagnosis not present

## 2023-04-28 DIAGNOSIS — B348 Other viral infections of unspecified site: Secondary | ICD-10-CM | POA: Diagnosis not present

## 2023-04-28 DIAGNOSIS — D631 Anemia in chronic kidney disease: Secondary | ICD-10-CM | POA: Diagnosis not present

## 2023-04-28 DIAGNOSIS — D630 Anemia in neoplastic disease: Secondary | ICD-10-CM | POA: Diagnosis not present

## 2023-04-28 DIAGNOSIS — C7951 Secondary malignant neoplasm of bone: Secondary | ICD-10-CM | POA: Diagnosis not present

## 2023-04-30 ENCOUNTER — Inpatient Hospital Stay: Payer: No Typology Code available for payment source | Attending: Oncology

## 2023-04-30 VITALS — BP 110/66 | HR 83 | Resp 18

## 2023-04-30 DIAGNOSIS — Z452 Encounter for adjustment and management of vascular access device: Secondary | ICD-10-CM | POA: Insufficient documentation

## 2023-04-30 DIAGNOSIS — C342 Malignant neoplasm of middle lobe, bronchus or lung: Secondary | ICD-10-CM | POA: Insufficient documentation

## 2023-04-30 DIAGNOSIS — Z5112 Encounter for antineoplastic immunotherapy: Secondary | ICD-10-CM | POA: Insufficient documentation

## 2023-04-30 MED ORDER — SODIUM CHLORIDE 0.9% FLUSH
10.0000 mL | Freq: Once | INTRAVENOUS | Status: AC
  Administered 2023-04-30: 10 mL via INTRAVENOUS

## 2023-04-30 MED ORDER — HEPARIN SOD (PORK) LOCK FLUSH 100 UNIT/ML IV SOLN
250.0000 [IU] | Freq: Once | INTRAVENOUS | Status: AC
  Administered 2023-04-30: 250 [IU] via INTRAVENOUS

## 2023-05-01 ENCOUNTER — Other Ambulatory Visit: Payer: Self-pay

## 2023-05-01 DIAGNOSIS — C3490 Malignant neoplasm of unspecified part of unspecified bronchus or lung: Secondary | ICD-10-CM

## 2023-05-03 ENCOUNTER — Encounter: Payer: Self-pay | Admitting: Physician Assistant

## 2023-05-03 ENCOUNTER — Other Ambulatory Visit: Payer: Self-pay | Admitting: Physician Assistant

## 2023-05-03 ENCOUNTER — Ambulatory Visit: Payer: No Typology Code available for payment source | Attending: Physician Assistant | Admitting: Physician Assistant

## 2023-05-03 VITALS — BP 132/84 | HR 87 | Ht 65.0 in | Wt 197.6 lb

## 2023-05-03 DIAGNOSIS — Z86711 Personal history of pulmonary embolism: Secondary | ICD-10-CM | POA: Diagnosis not present

## 2023-05-03 DIAGNOSIS — I629 Nontraumatic intracranial hemorrhage, unspecified: Secondary | ICD-10-CM

## 2023-05-03 DIAGNOSIS — C3490 Malignant neoplasm of unspecified part of unspecified bronchus or lung: Secondary | ICD-10-CM | POA: Diagnosis not present

## 2023-05-03 DIAGNOSIS — I5022 Chronic systolic (congestive) heart failure: Secondary | ICD-10-CM

## 2023-05-03 DIAGNOSIS — I10 Essential (primary) hypertension: Secondary | ICD-10-CM | POA: Diagnosis not present
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
## 2023-05-03 NOTE — Patient Instructions (Signed)
Medication Instructions:  Your physician recommends that you continue on your current medications as directed. Please refer to the Current Medication list given to you today.  *If you need a refill on your cardiac medications before your next appointment, please call your pharmacy*   Lab Work: None ordered  If you have labs (blood work) drawn today and your tests are completely normal, you will receive your results only by: MyChart Message (if you have MyChart) OR A paper copy in the mail If you have any lab test that is abnormal or we need to change your treatment, we will call you to review the results.   Testing/Procedures: None ordered  Follow-Up: At [facility], you and your health needs are our priority.  As part of our continuing mission to provide you with exceptional heart care, we have created designated Provider Care Teams.  These Care Teams include your primary Cardiologist (physician) and Advanced Practice Providers (APPs -  Physician Assistants and Nurse Practitioners) who all work together to provide you with the care you need, when you need it.  We recommend signing up for the patient portal called "MyChart".  Sign up information is provided on this After Visit Summary.  MyChart is used to connect with patients for Virtual Visits (Telemedicine).  Patients are able to view lab/test results, encounter notes, upcoming appointments, etc.  Non-urgent messages can be sent to your provider as well.   To learn more about what you can do with MyChart, go to [email or web address].    Your next appointment:   6 month(s)  Provider:   None     Other Instructions   

## 2023-05-04 ENCOUNTER — Inpatient Hospital Stay: Payer: No Typology Code available for payment source

## 2023-05-04 ENCOUNTER — Encounter: Payer: Self-pay | Admitting: Nurse Practitioner

## 2023-05-04 ENCOUNTER — Inpatient Hospital Stay (HOSPITAL_BASED_OUTPATIENT_CLINIC_OR_DEPARTMENT_OTHER): Payer: No Typology Code available for payment source | Admitting: Nurse Practitioner

## 2023-05-04 VITALS — BP 111/60 | HR 87 | Resp 18

## 2023-05-04 DIAGNOSIS — Z5112 Encounter for antineoplastic immunotherapy: Secondary | ICD-10-CM | POA: Diagnosis not present

## 2023-05-04 DIAGNOSIS — C342 Malignant neoplasm of middle lobe, bronchus or lung: Secondary | ICD-10-CM

## 2023-05-04 DIAGNOSIS — C3490 Malignant neoplasm of unspecified part of unspecified bronchus or lung: Secondary | ICD-10-CM

## 2023-05-04 LAB — CMP (CANCER CENTER ONLY)
ALT: 8 U/L (ref 0–44)
AST: 13 U/L — ABNORMAL LOW (ref 15–41)
Albumin: 3.1 g/dL — ABNORMAL LOW (ref 3.5–5.0)
Alkaline Phosphatase: 67 U/L (ref 38–126)
Anion gap: 5 (ref 5–15)
BUN: 14 mg/dL (ref 8–23)
CO2: 30 mmol/L (ref 22–32)
Calcium: 8.4 mg/dL — ABNORMAL LOW (ref 8.9–10.3)
Chloride: 106 mmol/L (ref 98–111)
Creatinine: 1.03 mg/dL — ABNORMAL HIGH (ref 0.44–1.00)
GFR, Estimated: 56 mL/min — ABNORMAL LOW (ref >60)
Glucose, Bld: 100 mg/dL — ABNORMAL HIGH (ref 70–99)
Potassium: 3.9 mmol/L (ref 3.5–5.1)
Sodium: 141 mmol/L (ref 135–145)
Total Bilirubin: 0.4 mg/dL (ref 0.3–1.2)
Total Protein: 5.8 g/dL — ABNORMAL LOW (ref 6.5–8.1)

## 2023-05-04 LAB — CBC WITH DIFFERENTIAL (CANCER CENTER ONLY)
Abs Immature Granulocytes: 0.01 10*3/uL (ref 0.00–0.07)
Basophils Absolute: 0 10*3/uL (ref 0.0–0.1)
Basophils Relative: 0 %
Eosinophils Absolute: 0 10*3/uL (ref 0.0–0.5)
Eosinophils Relative: 0 %
HCT: 34 % — ABNORMAL LOW (ref 36.0–46.0)
Hemoglobin: 10.5 g/dL — ABNORMAL LOW (ref 12.0–15.0)
Immature Granulocytes: 0 %
Lymphocytes Relative: 38 %
Lymphs Abs: 1.2 10*3/uL (ref 0.7–4.0)
MCH: 32.2 pg (ref 26.0–34.0)
MCHC: 30.9 g/dL (ref 30.0–36.0)
MCV: 104.3 fL — ABNORMAL HIGH (ref 80.0–100.0)
Monocytes Absolute: 0.3 10*3/uL (ref 0.1–1.0)
Monocytes Relative: 9 %
Neutro Abs: 1.7 10*3/uL (ref 1.7–7.7)
Neutrophils Relative %: 53 %
Platelet Count: 168 10*3/uL (ref 150–400)
RBC: 3.26 MIL/uL — ABNORMAL LOW (ref 3.87–5.11)
RDW: 15.7 % — ABNORMAL HIGH (ref 11.5–15.5)
WBC Count: 3.2 10*3/uL — ABNORMAL LOW (ref 4.0–10.5)
nRBC: 0 % (ref 0.0–0.2)

## 2023-05-04 LAB — MAGNESIUM: Magnesium: 1.8 mg/dL (ref 1.7–2.4)

## 2023-05-04 MED ORDER — SODIUM CHLORIDE 0.9 % IV SOLN
1200.0000 mg | Freq: Once | INTRAVENOUS | Status: AC
  Administered 2023-05-04: 1200 mg via INTRAVENOUS
  Filled 2023-05-04: qty 20

## 2023-05-04 MED ORDER — HEPARIN SOD (PORK) LOCK FLUSH 100 UNIT/ML IV SOLN
500.0000 [IU] | Freq: Once | INTRAVENOUS | Status: AC | PRN
  Administered 2023-05-04: 500 [IU]

## 2023-05-04 MED ORDER — SODIUM CHLORIDE 0.9% FLUSH
10.0000 mL | INTRAVENOUS | Status: DC | PRN
  Administered 2023-05-04: 10 mL

## 2023-05-04 MED ORDER — SODIUM CHLORIDE 0.9 % IV SOLN: Freq: Once | INTRAVENOUS | Status: AC
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
## 2023-05-04 NOTE — Patient Instructions (Signed)
[facility]   Discharge Instructions: Thank you for choosing [facility] to provide your oncology and hematology care.   If you have a lab appointment with the Cancer Center, please go directly to the Cancer Center and check in at the registration area.   Wear comfortable clothing and clothing appropriate for easy access to any Portacath or PICC line.   We strive to give you quality time with your provider. You may need to reschedule your appointment if you arrive late (15 or more minutes).  Arriving late affects you and other patients whose appointments are after yours.  Also, if you miss three or more appointments without notifying the office, you may be dismissed from the clinic at the provider's discretion.      For prescription refill requests, have your pharmacy contact our office and allow 72 hours for refills to be completed.    Today you received the following chemotherapy and/or immunotherapy agents Atezolizumab (TECENTRIQ).      To help prevent nausea and vomiting after your treatment, we encourage you to take your nausea medication as directed.  BELOW ARE SYMPTOMS THAT SHOULD BE REPORTED IMMEDIATELY: *FEVER GREATER THAN 100.4 F (38 C) OR HIGHER *CHILLS OR SWEATING *NAUSEA AND VOMITING THAT IS NOT CONTROLLED WITH YOUR NAUSEA MEDICATION *UNUSUAL SHORTNESS OF BREATH *UNUSUAL BRUISING OR BLEEDING *URINARY PROBLEMS (pain or burning when urinating, or frequent urination) *BOWEL PROBLEMS (unusual diarrhea, constipation, pain near the anus) TENDERNESS IN MOUTH AND THROAT WITH OR WITHOUT PRESENCE OF ULCERS (sore throat, sores in mouth, or a toothache) UNUSUAL RASH, SWELLING OR PAIN  UNUSUAL VAGINAL DISCHARGE OR ITCHING   Items with * indicate a potential emergency and should be followed up as soon as possible or go to the Emergency Department if any problems should occur.  Please show the CHEMOTHERAPY ALERT CARD or IMMUNOTHERAPY  ALERT CARD at check-in to the Emergency Department and triage nurse.  Should you have questions after your visit or need to cancel or reschedule your appointment, please contact [facility]  Dept: [phone]  and follow the prompts.  Office hours are 8:00 a.m. to 4:30 p.m. Monday - Friday. Please note that voicemails left after 4:00 p.m. may not be returned until the following business day.  We are closed weekends and major holidays. You have access to a nurse at all times for urgent questions. Please call the main number to the clinic Dept: [phone] and follow the prompts.   For any non-urgent questions, you may also contact your provider using MyChart. We now offer e-Visits for anyone 71 and older to request care online for non-urgent symptoms. For details visit [email or web address].   Also download the MyChart app! Go to the app store, search "MyChart", open the app, select [place], and log in with your MyChart username and password.  Atezolizumab Injection What is this medication? ATEZOLIZUMAB (a te zoe LIZ ue mab) treats some types of cancer. It works by helping your immune system slow or stop the spread of cancer cells. It is a monoclonal antibody. This medicine may be used for other purposes; ask your health care provider or pharmacist if you have questions. COMMON BRAND NAME(S): Tecentriq What should I tell my care team before I take this medication? They need to know if you have any of these conditions: Allogeneic stem cell transplant (uses someone else's stem cells) Autoimmune diseases, such as Crohn disease, ulcerative colitis, lupus History of chest radiation  Nervous system problems, such as Guillain-Barre syndrome, myasthenia gravis Organ transplant An unusual or allergic reaction to atezolizumab, other medications, foods, dyes, or preservatives Pregnant or trying to get pregnant Breast-feeding How should I use this  medication? This medication is injected into a vein. It is given by your care team in a hospital or clinic setting. A special MedGuide will be given to you before each treatment. Be sure to read this information carefully each time. Talk to your care team about the use of this medication in children. While it may be prescribed for children as young as 2 years for selected conditions, precautions do apply. Overdosage: If you think you have taken too much of this medicine contact a poison control center or emergency room at once. NOTE: This medicine is only for you. Do not share this medicine with others. What if I miss a dose? Keep appointments for follow-up doses. It is important not to miss your dose. Call your care team if you are unable to keep an appointment. What may interact with this medication? Interactions have not been studied. This list may not describe all possible interactions. Give your health care provider a list of all the medicines, herbs, non-prescription drugs, or dietary supplements you use. Also tell them if you smoke, drink alcohol, or use illegal drugs. Some items may interact with your medicine. What should I watch for while using this medication? Your condition will be monitored carefully while you are receiving this medication. You may need blood work while taking this medication. This medication may cause serious skin reactions. They can happen weeks to months after starting the medication. Contact your care team right away if you notice fevers or flu-like symptoms with a rash. The rash may be red or purple and then turn into blisters or peeling of the skin. You may also notice a red rash with swelling of the face, lips, or lymph nodes in your neck or under your arms. Tell your care team right away if you have any change in your eyesight. Talk to your care team if you may be pregnant. Serious birth defects can occur if you take this medication during pregnancy and for 5  months after the last dose. You will need a negative pregnancy test before starting this medication. Contraception is recommended while taking this medication and for 5 months after the last dose. Your care team can help you find the option that works for you. Do not breastfeed while taking this medication and for at least 5 months after the last dose. What side effects may I notice from receiving this medication? Side effects that you should report to your doctor or health care professional as soon as possible: Allergic reactions--skin rash, itching, hives, swelling of the face, lips, tongue, or throat Dry cough, shortness of breath or trouble breathing Eye pain, redness, irritation, or discharge with blurry or decreased vision Heart muscle inflammation--unusual weakness or fatigue, shortness of breath, chest pain, fast or irregular heartbeat, dizziness, swelling of the ankles, feet, or hands Hormone gland problems--headache, sensitivity to light, unusual weakness or fatigue, dizziness, fast or irregular heartbeat, increased sensitivity to cold or heat, excessive sweating, constipation, hair loss, increased thirst or amount of urine, tremors or shaking, irritability Infusion reactions--chest pain, shortness of breath or trouble breathing, feeling faint or lightheaded Kidney injury (glomerulonephritis)--decrease in the amount of urine, red or dark brown urine, foamy or bubbly urine, swelling of the ankles, hands, or feet Liver injury--right upper belly pain, loss of appetite,  nausea, light-colored stool, dark yellow or brown urine, yellowing skin or eyes, unusual weakness or fatigue Pain, tingling, or numbness in the hands or feet, muscle weakness, change in vision, confusion or trouble speaking, loss of balance or coordination, trouble walking, seizures Rash, fever, and swollen lymph nodes Redness, blistering, peeling, or loosening of the skin, including inside the mouth Sudden or severe stomach  pain, bloody diarrhea, fever, nausea, vomiting Side effects that usually do not require medical attention (report to your doctor or health care professional if they continue or are bothersome): Bone, joint, or muscle pain Diarrhea Fatigue Loss of appetite Nausea Skin rash This list may not describe all possible side effects. Call your doctor for medical advice about side effects. You may report side effects to FDA at 1-800-FDA-1088. Where should I keep my medication? This medication is given in a hospital or clinic. It will not be stored at home. NOTE: This sheet is a summary. It may not cover all possible information. If you have questions about this medicine, talk to your doctor, pharmacist, or health care provider.  2024 Elsevier/Gold Standard (2022-02-24 00:00:00)

## 2023-05-04 NOTE — Progress Notes (Signed)
Patient seen by Lisa Thomas NP today  Vitals are within treatment parameters.  Labs reviewed by Lisa Thomas NP and are within treatment parameters.  Per physician team, patient is ready for treatment and there are NO modifications to the treatment plan.     

## 2023-05-05 ENCOUNTER — Other Ambulatory Visit: Payer: Self-pay

## 2023-05-07 ENCOUNTER — Inpatient Hospital Stay: Payer: No Typology Code available for payment source

## 2023-05-07 VITALS — BP 120/65 | HR 84 | Temp 98.2°F | Resp 18

## 2023-05-07 DIAGNOSIS — Z452 Encounter for adjustment and management of vascular access device: Secondary | ICD-10-CM

## 2023-05-07 DIAGNOSIS — Z5112 Encounter for antineoplastic immunotherapy: Secondary | ICD-10-CM | POA: Diagnosis not present

## 2023-05-07 MED ORDER — HEPARIN SOD (PORK) LOCK FLUSH 100 UNIT/ML IV SOLN
250.0000 [IU] | Freq: Once | INTRAVENOUS | Status: AC
  Administered 2023-05-07: 250 [IU] via INTRAVENOUS

## 2023-05-07 MED ORDER — SODIUM CHLORIDE 0.9% FLUSH
10.0000 mL | Freq: Once | INTRAVENOUS | Status: AC
  Administered 2023-05-07: 10 mL via INTRAVENOUS

## 2023-05-07 NOTE — Patient Instructions (Signed)
PICC Home Care Guide A peripherally inserted central catheter (PICC) is a form of IV access that allows medicines and IV fluids to be quickly put into the blood and spread throughout the body. The PICC is a long, thin, flexible tube (catheter) that is put into a vein in a person's arm or leg. The catheter ends in a large vein just outside the heart called the superior vena cava (SVC). After the PICC is put in, a chest X-ray may be done to make sure that it is in the right place. A PICC may be placed for different reasons, such as: To give medicines and liquid nutrition. To give IV fluids and blood products. To take blood samples often. If there is trouble placing a peripheral intravenous (PIV) catheter. If cared for properly, a PICC can remain in place for many months. Having a PICC can allow you to go home from the hospital sooner and continue treatment at home. Medicines and PICC care can be managed at home by a family member, caregiver, or home health care team. What are the risks? Generally, having a PICC is safe. However, problems may occur, including: A blood clot (thrombus) forming in or at the end of the PICC. A blood clot forming in a vein (deep vein thrombosis) or traveling to the lung (pulmonary embolism). Inflammation of the vein (phlebitis) in which the PICC is placed. Infection at the insertion site or in the blood. Blood infections from central lines, like PICCs, can be serious and often require a hospital stay. PICC malposition, or PICC movement or poor placement. A break or cut in the PICC. Do not use scissors near the PICC. Nerve or tendon irritation or injury during PICC insertion. How to care for your PICC Please follow the specific guidelines provided by your health care provider. Preventing infection You and any caregivers should wash your hands often with soap and water for at least 20 seconds. Wash hands: Before touching the PICC or the infusion device. Before changing a  bandage (dressing). Do not change the dressing unless you have been taught to do so and have shown you are able to change it safely. Flush the PICC as told. Tell your health care provider right away if the PICC is hard to flush or does not flush. Do not use force to flush the PICC. Use clean and germ-free (sterile) supplies only. Keep the supplies in a dry place. Do not reuse needles, syringes, or any other supplies. Reusing supplies can lead to infection. Keep the PICC dressing dry and secure it with tape if the edges stop sticking to your skin. Check your PICC insertion site every day for signs of infection. Check for: Redness, swelling, or pain. Fluid or blood. Warmth. Pus or a bad smell. Preventing other problems Do not use a syringe that is less than 10 mL to flush the PICC. Do not have your blood pressure checked on the arm in which the PICC is placed. Do not ever pull or tug on the PICC. Keep it secured to your arm with tape or a stretch wrap when not in use. Do not take the PICC out yourself. Only a trained health care provider should remove the PICC. Keep pets and children away from your PICC. How to care for your PICC dressing Keep your PICC dressing clean and dry to prevent infection. Do not take baths, swim, or use a hot tub until your health care provider approves. Ask your health care provider if you can take  showers. You may only be allowed to take sponge baths. When you are allowed to shower: Ask your health care provider to teach you how to wrap the PICC. Cover the PICC with clear plastic wrap and tape to keep it dry while showering. Follow instructions from your health care provider about how to take care of your insertion site and dressing. Make sure you: Wash your hands with soap and water for at least 20 seconds before and after you change your dressing. If soap and water are not available, use hand sanitizer. Change your dressing only if taught to do so by your health care  provider. Your PICC dressing needs to be changed if it becomes loose or wet. Leave stitches (sutures), skin glue, or adhesive strips in place. These skin closures may need to stay in place for 2 weeks or longer. If adhesive strip edges start to loosen and curl up, you may trim the loose edges. Do not remove adhesive strips completely unless your health care provider tells you to do that. Follow these instructions at home: Disposal of supplies Throw away any syringes in a disposal container that is meant for sharp items (sharps container). You can buy a sharps container from a pharmacy, or you can make one by using an empty, hard plastic bottle with a lid. Place any used dressings or infusion bags into a plastic bag. Throw that bag in the trash. General instructions  Always carry your PICC identification card or wear a medical alert bracelet. Keep the tube clamped at all times, unless it is being used. Always carry a smooth-edge clamp with you to clamp the PICC if it breaks. Do not use scissors or sharp objects near the tube. You may bend your arm and move it freely. If your PICC is near or at the bend of your elbow, avoid activity with repeated motion at the elbow. Avoid lifting heavy objects as told by your health care provider. Keep all follow-up visits. This is important. You will need to have your PICC dressing changed at least once a week. Contact a health care provider if: You have pain in your arm, ear, face, or teeth. You have a fever or chills. You have redness, swelling, or pain around the insertion site. You have fluid or blood coming from the insertion site. Your insertion site feels warm to the touch. You have pus or a bad smell coming from the insertion site. Your skin feels hard and raised around the insertion site. Your PICC dressing has gotten wet or is coming off and you have not been taught how to change it. Get help right away if: You have problems with your PICC, such as  your PICC: Was tugged or pulled and has partially come out. Do not  push the PICC back in. Cannot be flushed, is hard to flush, or leaks around the insertion site when it is flushed. Makes a flushing sound when it is flushed. Appears to have a hole or tear. Is accidentally pulled all the way out. If this happens, cover the insertion site with a gauze dressing. Do not throw the PICC away. Your health care provider will need to check it to be sure the entire catheter came out. You feel your heart racing or skipping beats, or you have chest pain. You have shortness of breath or trouble breathing. You have swelling, redness, warmth, or pain in the arm in which the PICC is placed. You have a red streak going up your arm that  starts under the PICC dressing. These symptoms may be an emergency. Get help right away. Call 911. Do not wait to see if the symptoms will go away. Do not drive yourself to the hospital. Summary A peripherally inserted central catheter (PICC) is a long, thin, flexible tube (catheter) that is put into a vein in the arm or leg. If cared for properly, a PICC can remain in place for many months. Having a PICC can allow you to go home from the hospital sooner and continue treatment at home. The PICC is inserted using a germ-free (sterile) technique by a specially trained health care provider. Only a trained health care provider should remove it. Do not have your blood pressure checked on the arm in which your PICC is placed. Always keep your PICC identification card with you. This information is not intended to replace advice given to you by your health care provider. Make sure you discuss any questions you have with your health care provider. Document Revised: 04/27/2021 Document Reviewed: 04/27/2021 Elsevier Patient Education  2024 [employer].

## 2023-05-14 ENCOUNTER — Inpatient Hospital Stay: Payer: No Typology Code available for payment source | Admitting: Nurse Practitioner

## 2023-05-14 ENCOUNTER — Inpatient Hospital Stay: Payer: No Typology Code available for payment source

## 2023-05-14 VITALS — BP 101/61 | HR 84 | Temp 97.6°F | Resp 18

## 2023-05-14 DIAGNOSIS — Z5112 Encounter for antineoplastic immunotherapy: Secondary | ICD-10-CM | POA: Diagnosis not present

## 2023-05-14 DIAGNOSIS — Z452 Encounter for adjustment and management of vascular access device: Secondary | ICD-10-CM

## 2023-05-14 MED ORDER — HEPARIN SOD (PORK) LOCK FLUSH 100 UNIT/ML IV SOLN
500.0000 [IU] | Freq: Once | INTRAVENOUS | Status: AC
  Administered 2023-05-14: 500 [IU] via INTRAVENOUS

## 2023-05-14 MED ORDER — SODIUM CHLORIDE 0.9% FLUSH
10.0000 mL | Freq: Once | INTRAVENOUS | Status: AC
  Administered 2023-05-14: 10 mL via INTRAVENOUS

## 2023-05-14 NOTE — Patient Instructions (Signed)
PICC Home Care Guide A peripherally inserted central catheter (PICC) is a form of IV access that allows medicines and IV fluids to be quickly put into the blood and spread throughout the body. The PICC is a long, thin, flexible tube (catheter) that is put into a vein in a person's arm or leg. The catheter ends in a large vein just outside the heart called the superior vena cava (SVC). After the PICC is put in, a chest X-ray may be done to make sure that it is in the right place. A PICC may be placed for different reasons, such as: To give medicines and liquid nutrition. To give IV fluids and blood products. To take blood samples often. If there is trouble placing a peripheral intravenous (PIV) catheter. If cared for properly, a PICC can remain in place for many months. Having a PICC can allow you to go home from the hospital sooner and continue treatment at home. Medicines and PICC care can be managed at home by a family member, caregiver, or home health care team. What are the risks? Generally, having a PICC is safe. However, problems may occur, including: A blood clot (thrombus) forming in or at the end of the PICC. A blood clot forming in a vein (deep vein thrombosis) or traveling to the lung (pulmonary embolism). Inflammation of the vein (phlebitis) in which the PICC is placed. Infection at the insertion site or in the blood. Blood infections from central lines, like PICCs, can be serious and often require a hospital stay. PICC malposition, or PICC movement or poor placement. A break or cut in the PICC. Do not use scissors near the PICC. Nerve or tendon irritation or injury during PICC insertion. How to care for your PICC Please follow the specific guidelines provided by your health care provider. Preventing infection You and any caregivers should wash your hands often with soap and water for at least 20 seconds. Wash hands: Before touching the PICC or the infusion device. Before changing a  bandage (dressing). Do not change the dressing unless you have been taught to do so and have shown you are able to change it safely. Flush the PICC as told. Tell your health care provider right away if the PICC is hard to flush or does not flush. Do not use force to flush the PICC. Use clean and germ-free (sterile) supplies only. Keep the supplies in a dry place. Do not reuse needles, syringes, or any other supplies. Reusing supplies can lead to infection. Keep the PICC dressing dry and secure it with tape if the edges stop sticking to your skin. Check your PICC insertion site every day for signs of infection. Check for: Redness, swelling, or pain. Fluid or blood. Warmth. Pus or a bad smell. Preventing other problems Do not use a syringe that is less than 10 mL to flush the PICC. Do not have your blood pressure checked on the arm in which the PICC is placed. Do not ever pull or tug on the PICC. Keep it secured to your arm with tape or a stretch wrap when not in use. Do not take the PICC out yourself. Only a trained health care provider should remove the PICC. Keep pets and children away from your PICC. How to care for your PICC dressing Keep your PICC dressing clean and dry to prevent infection. Do not take baths, swim, or use a hot tub until your health care provider approves. Ask your health care provider if you can take  showers. You may only be allowed to take sponge baths. When you are allowed to shower: Ask your health care provider to teach you how to wrap the PICC. Cover the PICC with clear plastic wrap and tape to keep it dry while showering. Follow instructions from your health care provider about how to take care of your insertion site and dressing. Make sure you: Wash your hands with soap and water for at least 20 seconds before and after you change your dressing. If soap and water are not available, use hand sanitizer. Change your dressing only if taught to do so by your health care  provider. Your PICC dressing needs to be changed if it becomes loose or wet. Leave stitches (sutures), skin glue, or adhesive strips in place. These skin closures may need to stay in place for 2 weeks or longer. If adhesive strip edges start to loosen and curl up, you may trim the loose edges. Do not remove adhesive strips completely unless your health care provider tells you to do that. Follow these instructions at home: Disposal of supplies Throw away any syringes in a disposal container that is meant for sharp items (sharps container). You can buy a sharps container from a pharmacy, or you can make one by using an empty, hard plastic bottle with a lid. Place any used dressings or infusion bags into a plastic bag. Throw that bag in the trash. General instructions  Always carry your PICC identification card or wear a medical alert bracelet. Keep the tube clamped at all times, unless it is being used. Always carry a smooth-edge clamp with you to clamp the PICC if it breaks. Do not use scissors or sharp objects near the tube. You may bend your arm and move it freely. If your PICC is near or at the bend of your elbow, avoid activity with repeated motion at the elbow. Avoid lifting heavy objects as told by your health care provider. Keep all follow-up visits. This is important. You will need to have your PICC dressing changed at least once a week. Contact a health care provider if: You have pain in your arm, ear, face, or teeth. You have a fever or chills. You have redness, swelling, or pain around the insertion site. You have fluid or blood coming from the insertion site. Your insertion site feels warm to the touch. You have pus or a bad smell coming from the insertion site. Your skin feels hard and raised around the insertion site. Your PICC dressing has gotten wet or is coming off and you have not been taught how to change it. Get help right away if: You have problems with your PICC, such as  your PICC: Was tugged or pulled and has partially come out. Do not  push the PICC back in. Cannot be flushed, is hard to flush, or leaks around the insertion site when it is flushed. Makes a flushing sound when it is flushed. Appears to have a hole or tear. Is accidentally pulled all the way out. If this happens, cover the insertion site with a gauze dressing. Do not throw the PICC away. Your health care provider will need to check it to be sure the entire catheter came out. You feel your heart racing or skipping beats, or you have chest pain. You have shortness of breath or trouble breathing. You have swelling, redness, warmth, or pain in the arm in which the PICC is placed. You have a red streak going up your arm that  starts under the PICC dressing. These symptoms may be an emergency. Get help right away. Call 911. Do not wait to see if the symptoms will go away. Do not drive yourself to the hospital. Summary A peripherally inserted central catheter (PICC) is a long, thin, flexible tube (catheter) that is put into a vein in the arm or leg. If cared for properly, a PICC can remain in place for many months. Having a PICC can allow you to go home from the hospital sooner and continue treatment at home. The PICC is inserted using a germ-free (sterile) technique by a specially trained health care provider. Only a trained health care provider should remove it. Do not have your blood pressure checked on the arm in which your PICC is placed. Always keep your PICC identification card with you. This information is not intended to replace advice given to you by your health care provider. Make sure you discuss any questions you have with your health care provider. Document Revised: 04/27/2021 Document Reviewed: 04/27/2021 Elsevier Patient Education  2024 [employer].

## 2023-05-18 ENCOUNTER — Other Ambulatory Visit: Payer: Self-pay

## 2023-05-18 DIAGNOSIS — I10 Essential (primary) hypertension: Secondary | ICD-10-CM | POA: Diagnosis not present

## 2023-05-18 DIAGNOSIS — C3491 Malignant neoplasm of unspecified part of right bronchus or lung: Secondary | ICD-10-CM | POA: Diagnosis not present

## 2023-05-20 ENCOUNTER — Other Ambulatory Visit: Payer: Self-pay | Admitting: Oncology

## 2023-05-21 ENCOUNTER — Inpatient Hospital Stay: Payer: No Typology Code available for payment source

## 2023-05-21 DIAGNOSIS — Z95828 Presence of other vascular implants and grafts: Secondary | ICD-10-CM

## 2023-05-21 DIAGNOSIS — J9601 Acute respiratory failure with hypoxia: Secondary | ICD-10-CM | POA: Diagnosis not present

## 2023-05-21 DIAGNOSIS — Z5112 Encounter for antineoplastic immunotherapy: Secondary | ICD-10-CM | POA: Diagnosis not present

## 2023-05-21 DIAGNOSIS — J910 Malignant pleural effusion: Secondary | ICD-10-CM | POA: Diagnosis not present

## 2023-05-21 DIAGNOSIS — Z452 Encounter for adjustment and management of vascular access device: Secondary | ICD-10-CM

## 2023-05-21 MED ORDER — SODIUM CHLORIDE 0.9% FLUSH
10.0000 mL | INTRAVENOUS | Status: DC | PRN
  Administered 2023-05-21: 10 mL via INTRAVENOUS

## 2023-05-21 MED ORDER — HEPARIN SOD (PORK) LOCK FLUSH 100 UNIT/ML IV SOLN
500.0000 [IU] | Freq: Once | INTRAVENOUS | Status: AC
  Administered 2023-05-21: 500 [IU] via INTRAVENOUS

## 2023-05-21 NOTE — Patient Instructions (Signed)
PICC Home Care Guide A peripherally inserted central catheter (PICC) is a form of IV access that allows medicines and IV fluids to be quickly put into the blood and spread throughout the body. The PICC is a long, thin, flexible tube (catheter) that is put into a vein in a person's arm or leg. The catheter ends in a large vein just outside the heart called the superior vena cava (SVC). After the PICC is put in, a chest X-ray may be done to make sure that it is in the right place. A PICC may be placed for different reasons, such as: To give medicines and liquid nutrition. To give IV fluids and blood products. To take blood samples often. If there is trouble placing a peripheral intravenous (PIV) catheter. If cared for properly, a PICC can remain in place for many months. Having a PICC can allow you to go home from the hospital sooner and continue treatment at home. Medicines and PICC care can be managed at home by a family member, caregiver, or home health care team. What are the risks? Generally, having a PICC is safe. However, problems may occur, including: A blood clot (thrombus) forming in or at the end of the PICC. A blood clot forming in a vein (deep vein thrombosis) or traveling to the lung (pulmonary embolism). Inflammation of the vein (phlebitis) in which the PICC is placed. Infection at the insertion site or in the blood. Blood infections from central lines, like PICCs, can be serious and often require a hospital stay. PICC malposition, or PICC movement or poor placement. A break or cut in the PICC. Do not use scissors near the PICC. Nerve or tendon irritation or injury during PICC insertion. How to care for your PICC Please follow the specific guidelines provided by your health care provider. Preventing infection You and any caregivers should wash your hands often with soap and water for at least 20 seconds. Wash hands: Before touching the PICC or the infusion device. Before changing a  bandage (dressing). Do not change the dressing unless you have been taught to do so and have shown you are able to change it safely. Flush the PICC as told. Tell your health care provider right away if the PICC is hard to flush or does not flush. Do not use force to flush the PICC. Use clean and germ-free (sterile) supplies only. Keep the supplies in a dry place. Do not reuse needles, syringes, or any other supplies. Reusing supplies can lead to infection. Keep the PICC dressing dry and secure it with tape if the edges stop sticking to your skin. Check your PICC insertion site every day for signs of infection. Check for: Redness, swelling, or pain. Fluid or blood. Warmth. Pus or a bad smell. Preventing other problems Do not use a syringe that is less than 10 mL to flush the PICC. Do not have your blood pressure checked on the arm in which the PICC is placed. Do not ever pull or tug on the PICC. Keep it secured to your arm with tape or a stretch wrap when not in use. Do not take the PICC out yourself. Only a trained health care provider should remove the PICC. Keep pets and children away from your PICC. How to care for your PICC dressing Keep your PICC dressing clean and dry to prevent infection. Do not take baths, swim, or use a hot tub until your health care provider approves. Ask your health care provider if you can take  showers. You may only be allowed to take sponge baths. When you are allowed to shower: Ask your health care provider to teach you how to wrap the PICC. Cover the PICC with clear plastic wrap and tape to keep it dry while showering. Follow instructions from your health care provider about how to take care of your insertion site and dressing. Make sure you: Wash your hands with soap and water for at least 20 seconds before and after you change your dressing. If soap and water are not available, use hand sanitizer. Change your dressing only if taught to do so by your health care  provider. Your PICC dressing needs to be changed if it becomes loose or wet. Leave stitches (sutures), skin glue, or adhesive strips in place. These skin closures may need to stay in place for 2 weeks or longer. If adhesive strip edges start to loosen and curl up, you may trim the loose edges. Do not remove adhesive strips completely unless your health care provider tells you to do that. Follow these instructions at home: Disposal of supplies Throw away any syringes in a disposal container that is meant for sharp items (sharps container). You can buy a sharps container from a pharmacy, or you can make one by using an empty, hard plastic bottle with a lid. Place any used dressings or infusion bags into a plastic bag. Throw that bag in the trash. General instructions  Always carry your PICC identification card or wear a medical alert bracelet. Keep the tube clamped at all times, unless it is being used. Always carry a smooth-edge clamp with you to clamp the PICC if it breaks. Do not use scissors or sharp objects near the tube. You may bend your arm and move it freely. If your PICC is near or at the bend of your elbow, avoid activity with repeated motion at the elbow. Avoid lifting heavy objects as told by your health care provider. Keep all follow-up visits. This is important. You will need to have your PICC dressing changed at least once a week. Contact a health care provider if: You have pain in your arm, ear, face, or teeth. You have a fever or chills. You have redness, swelling, or pain around the insertion site. You have fluid or blood coming from the insertion site. Your insertion site feels warm to the touch. You have pus or a bad smell coming from the insertion site. Your skin feels hard and raised around the insertion site. Your PICC dressing has gotten wet or is coming off and you have not been taught how to change it. Get help right away if: You have problems with your PICC, such as  your PICC: Was tugged or pulled and has partially come out. Do not  push the PICC back in. Cannot be flushed, is hard to flush, or leaks around the insertion site when it is flushed. Makes a flushing sound when it is flushed. Appears to have a hole or tear. Is accidentally pulled all the way out. If this happens, cover the insertion site with a gauze dressing. Do not throw the PICC away. Your health care provider will need to check it to be sure the entire catheter came out. You feel your heart racing or skipping beats, or you have chest pain. You have shortness of breath or trouble breathing. You have swelling, redness, warmth, or pain in the arm in which the PICC is placed. You have a red streak going up your arm that  starts under the PICC dressing. These symptoms may be an emergency. Get help right away. Call 911. Do not wait to see if the symptoms will go away. Do not drive yourself to the hospital. Summary A peripherally inserted central catheter (PICC) is a long, thin, flexible tube (catheter) that is put into a vein in the arm or leg. If cared for properly, a PICC can remain in place for many months. Having a PICC can allow you to go home from the hospital sooner and continue treatment at home. The PICC is inserted using a germ-free (sterile) technique by a specially trained health care provider. Only a trained health care provider should remove it. Do not have your blood pressure checked on the arm in which your PICC is placed. Always keep your PICC identification card with you. This information is not intended to replace advice given to you by your health care provider. Make sure you discuss any questions you have with your health care provider. Document Revised: 04/27/2021 Document Reviewed: 04/27/2021 Elsevier Patient Education  2024 [employer].

## 2023-05-22 DIAGNOSIS — I10 Essential (primary) hypertension: Secondary | ICD-10-CM | POA: Diagnosis not present

## 2023-05-22 DIAGNOSIS — Z79899 Other long term (current) drug therapy: Secondary | ICD-10-CM | POA: Diagnosis not present

## 2023-05-22 DIAGNOSIS — Z7901 Long term (current) use of anticoagulants: Secondary | ICD-10-CM | POA: Diagnosis not present

## 2023-05-22 DIAGNOSIS — Z86711 Personal history of pulmonary embolism: Secondary | ICD-10-CM | POA: Diagnosis not present

## 2023-05-22 DIAGNOSIS — C3491 Malignant neoplasm of unspecified part of right bronchus or lung: Secondary | ICD-10-CM | POA: Diagnosis not present

## 2023-05-22 DIAGNOSIS — Z86718 Personal history of other venous thrombosis and embolism: Secondary | ICD-10-CM | POA: Diagnosis not present

## 2023-05-24 ENCOUNTER — Telehealth: Payer: Self-pay | Admitting: *Deleted

## 2023-05-24 NOTE — Telephone Encounter (Signed)
Voice mail from Crystal [name] (not on ROI) asking the following questions: Will she ever get chemotherapy again? When will MD re-scan her to assess her status? Called [name] and made her aware that the above questions will be answered at her visit tomorrow. Reports Crystal is her niece.

## 2023-05-25 ENCOUNTER — Inpatient Hospital Stay: Payer: No Typology Code available for payment source

## 2023-05-25 ENCOUNTER — Inpatient Hospital Stay: Payer: No Typology Code available for payment source | Attending: Oncology

## 2023-05-25 ENCOUNTER — Encounter: Payer: Self-pay | Admitting: Oncology

## 2023-05-25 ENCOUNTER — Encounter: Payer: Self-pay | Admitting: *Deleted

## 2023-05-25 ENCOUNTER — Inpatient Hospital Stay: Payer: No Typology Code available for payment source | Admitting: Oncology

## 2023-05-25 VITALS — BP 118/80 | HR 79 | Temp 98.1°F | Resp 18 | Ht 65.0 in | Wt 200.9 lb

## 2023-05-25 VITALS — BP 100/62 | HR 88 | Resp 18

## 2023-05-25 DIAGNOSIS — C3490 Malignant neoplasm of unspecified part of unspecified bronchus or lung: Secondary | ICD-10-CM | POA: Diagnosis not present

## 2023-05-25 DIAGNOSIS — I509 Heart failure, unspecified: Secondary | ICD-10-CM | POA: Insufficient documentation

## 2023-05-25 DIAGNOSIS — I110 Hypertensive heart disease with heart failure: Secondary | ICD-10-CM | POA: Diagnosis not present

## 2023-05-25 DIAGNOSIS — C342 Malignant neoplasm of middle lobe, bronchus or lung: Secondary | ICD-10-CM

## 2023-05-25 DIAGNOSIS — Z452 Encounter for adjustment and management of vascular access device: Secondary | ICD-10-CM | POA: Diagnosis not present

## 2023-05-25 DIAGNOSIS — Z5112 Encounter for antineoplastic immunotherapy: Secondary | ICD-10-CM | POA: Diagnosis not present

## 2023-05-25 LAB — CBC WITH DIFFERENTIAL (CANCER CENTER ONLY)
Abs Immature Granulocytes: 0.02 10*3/uL (ref 0.00–0.07)
Basophils Absolute: 0 10*3/uL (ref 0.0–0.1)
Basophils Relative: 0 %
Eosinophils Absolute: 0 10*3/uL (ref 0.0–0.5)
Eosinophils Relative: 0 %
HCT: 34.1 % — ABNORMAL LOW (ref 36.0–46.0)
Hemoglobin: 11 g/dL — ABNORMAL LOW (ref 12.0–15.0)
Immature Granulocytes: 0 %
Lymphocytes Relative: 36 %
Lymphs Abs: 1.6 10*3/uL (ref 0.7–4.0)
MCH: 32.4 pg (ref 26.0–34.0)
MCHC: 32.3 g/dL (ref 30.0–36.0)
MCV: 100.6 fL — ABNORMAL HIGH (ref 80.0–100.0)
Monocytes Absolute: 0.4 10*3/uL (ref 0.1–1.0)
Monocytes Relative: 9 %
Neutro Abs: 2.4 10*3/uL (ref 1.7–7.7)
Neutrophils Relative %: 55 %
Platelet Count: 166 10*3/uL (ref 150–400)
RBC: 3.39 MIL/uL — ABNORMAL LOW (ref 3.87–5.11)
RDW: 14.5 % (ref 11.5–15.5)
WBC Count: 4.5 10*3/uL (ref 4.0–10.5)
nRBC: 0 % (ref 0.0–0.2)

## 2023-05-25 LAB — CMP (CANCER CENTER ONLY)
ALT: 8 U/L (ref 0–44)
AST: 16 U/L (ref 15–41)
Albumin: 3.2 g/dL — ABNORMAL LOW (ref 3.5–5.0)
Alkaline Phosphatase: 85 U/L (ref 38–126)
Anion gap: 7 (ref 5–15)
BUN: 18 mg/dL (ref 8–23)
CO2: 26 mmol/L (ref 22–32)
Calcium: 8.8 mg/dL — ABNORMAL LOW (ref 8.9–10.3)
Chloride: 106 mmol/L (ref 98–111)
Creatinine: 1.12 mg/dL — ABNORMAL HIGH (ref 0.44–1.00)
GFR, Estimated: 50 mL/min — ABNORMAL LOW (ref >60)
Glucose, Bld: 97 mg/dL (ref 70–99)
Potassium: 3.9 mmol/L (ref 3.5–5.1)
Sodium: 139 mmol/L (ref 135–145)
Total Bilirubin: 0.3 mg/dL (ref 0.3–1.2)
Total Protein: 6.2 g/dL — ABNORMAL LOW (ref 6.5–8.1)

## 2023-05-25 LAB — TSH: TSH: 3.47 u[IU]/mL (ref 0.350–4.500)

## 2023-05-25 MED ORDER — HEPARIN SOD (PORK) LOCK FLUSH 100 UNIT/ML IV SOLN
500.0000 [IU] | Freq: Once | INTRAVENOUS | Status: AC | PRN
  Administered 2023-05-25: 500 [IU]

## 2023-05-25 MED ORDER — SODIUM CHLORIDE 0.9 % IV SOLN: Freq: Once | INTRAVENOUS | Status: AC

## 2023-05-25 MED ORDER — SODIUM CHLORIDE 0.9% FLUSH
10.0000 mL | INTRAVENOUS | Status: DC | PRN
  Administered 2023-05-25: 10 mL

## 2023-05-25 MED ORDER — SODIUM CHLORIDE 0.9 % IV SOLN
1200.0000 mg | Freq: Once | INTRAVENOUS | Status: AC
  Administered 2023-05-25: 1200 mg via INTRAVENOUS
  Filled 2023-05-25: qty 20
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
## 2023-05-25 NOTE — Progress Notes (Signed)
Patient seen by Dr. Truett Perna today  Vitals are within treatment parameters.  Labs reviewed by Dr. Truett Perna and are within treatment parameters.  Per physician team, patient is ready for treatment and there are NO modifications to the treatment plan.  MD requesting PICC dressing change today as well

## 2023-05-25 NOTE — Patient Instructions (Signed)
[facility]   Discharge Instructions: Thank you for choosing [facility] to provide your oncology and hematology care.   If you have a lab appointment with the Cancer Center, please go directly to the Cancer Center and check in at the registration area.   Wear comfortable clothing and clothing appropriate for easy access to any Portacath or PICC line.   We strive to give you quality time with your provider. You may need to reschedule your appointment if you arrive late (15 or more minutes).  Arriving late affects you and other patients whose appointments are after yours.  Also, if you miss three or more appointments without notifying the office, you may be dismissed from the clinic at the provider's discretion.      For prescription refill requests, have your pharmacy contact our office and allow 72 hours for refills to be completed.    Today you received the following chemotherapy and/or immunotherapy agents Atezolizumab (TECENTRIQ).      To help prevent nausea and vomiting after your treatment, we encourage you to take your nausea medication as directed.  BELOW ARE SYMPTOMS THAT SHOULD BE REPORTED IMMEDIATELY: *FEVER GREATER THAN 100.4 F (38 C) OR HIGHER *CHILLS OR SWEATING *NAUSEA AND VOMITING THAT IS NOT CONTROLLED WITH YOUR NAUSEA MEDICATION *UNUSUAL SHORTNESS OF BREATH *UNUSUAL BRUISING OR BLEEDING *URINARY PROBLEMS (pain or burning when urinating, or frequent urination) *BOWEL PROBLEMS (unusual diarrhea, constipation, pain near the anus) TENDERNESS IN MOUTH AND THROAT WITH OR WITHOUT PRESENCE OF ULCERS (sore throat, sores in mouth, or a toothache) UNUSUAL RASH, SWELLING OR PAIN  UNUSUAL VAGINAL DISCHARGE OR ITCHING   Items with * indicate a potential emergency and should be followed up as soon as possible or go to the Emergency Department if any problems should occur.  Please show the CHEMOTHERAPY ALERT CARD or IMMUNOTHERAPY  ALERT CARD at check-in to the Emergency Department and triage nurse.  Should you have questions after your visit or need to cancel or reschedule your appointment, please contact [facility]  Dept: [phone]  and follow the prompts.  Office hours are 8:00 a.m. to 4:30 p.m. Monday - Friday. Please note that voicemails left after 4:00 p.m. may not be returned until the following business day.  We are closed weekends and major holidays. You have access to a nurse at all times for urgent questions. Please call the main number to the clinic Dept: [phone] and follow the prompts.   For any non-urgent questions, you may also contact your provider using MyChart. We now offer e-Visits for anyone 71 and older to request care online for non-urgent symptoms. For details visit [email or web address].   Also download the MyChart app! Go to the app store, search "MyChart", open the app, select [place], and log in with your MyChart username and password.  Atezolizumab Injection What is this medication? ATEZOLIZUMAB (a te zoe LIZ ue mab) treats some types of cancer. It works by helping your immune system slow or stop the spread of cancer cells. It is a monoclonal antibody. This medicine may be used for other purposes; ask your health care provider or pharmacist if you have questions. COMMON BRAND NAME(S): Tecentriq What should I tell my care team before I take this medication? They need to know if you have any of these conditions: Allogeneic stem cell transplant (uses someone else's stem cells) Autoimmune diseases, such as Crohn disease, ulcerative colitis, lupus History of chest radiation  Nervous system problems, such as Guillain-Barre syndrome, myasthenia gravis Organ transplant An unusual or allergic reaction to atezolizumab, other medications, foods, dyes, or preservatives Pregnant or trying to get pregnant Breast-feeding How should I use this  medication? This medication is injected into a vein. It is given by your care team in a hospital or clinic setting. A special MedGuide will be given to you before each treatment. Be sure to read this information carefully each time. Talk to your care team about the use of this medication in children. While it may be prescribed for children as young as 2 years for selected conditions, precautions do apply. Overdosage: If you think you have taken too much of this medicine contact a poison control center or emergency room at once. NOTE: This medicine is only for you. Do not share this medicine with others. What if I miss a dose? Keep appointments for follow-up doses. It is important not to miss your dose. Call your care team if you are unable to keep an appointment. What may interact with this medication? Interactions have not been studied. This list may not describe all possible interactions. Give your health care provider a list of all the medicines, herbs, non-prescription drugs, or dietary supplements you use. Also tell them if you smoke, drink alcohol, or use illegal drugs. Some items may interact with your medicine. What should I watch for while using this medication? Your condition will be monitored carefully while you are receiving this medication. You may need blood work while taking this medication. This medication may cause serious skin reactions. They can happen weeks to months after starting the medication. Contact your care team right away if you notice fevers or flu-like symptoms with a rash. The rash may be red or purple and then turn into blisters or peeling of the skin. You may also notice a red rash with swelling of the face, lips, or lymph nodes in your neck or under your arms. Tell your care team right away if you have any change in your eyesight. Talk to your care team if you may be pregnant. Serious birth defects can occur if you take this medication during pregnancy and for 5  months after the last dose. You will need a negative pregnancy test before starting this medication. Contraception is recommended while taking this medication and for 5 months after the last dose. Your care team can help you find the option that works for you. Do not breastfeed while taking this medication and for at least 5 months after the last dose. What side effects may I notice from receiving this medication? Side effects that you should report to your doctor or health care professional as soon as possible: Allergic reactions--skin rash, itching, hives, swelling of the face, lips, tongue, or throat Dry cough, shortness of breath or trouble breathing Eye pain, redness, irritation, or discharge with blurry or decreased vision Heart muscle inflammation--unusual weakness or fatigue, shortness of breath, chest pain, fast or irregular heartbeat, dizziness, swelling of the ankles, feet, or hands Hormone gland problems--headache, sensitivity to light, unusual weakness or fatigue, dizziness, fast or irregular heartbeat, increased sensitivity to cold or heat, excessive sweating, constipation, hair loss, increased thirst or amount of urine, tremors or shaking, irritability Infusion reactions--chest pain, shortness of breath or trouble breathing, feeling faint or lightheaded Kidney injury (glomerulonephritis)--decrease in the amount of urine, red or dark brown urine, foamy or bubbly urine, swelling of the ankles, hands, or feet Liver injury--right upper belly pain, loss of appetite,  nausea, light-colored stool, dark yellow or brown urine, yellowing skin or eyes, unusual weakness or fatigue Pain, tingling, or numbness in the hands or feet, muscle weakness, change in vision, confusion or trouble speaking, loss of balance or coordination, trouble walking, seizures Rash, fever, and swollen lymph nodes Redness, blistering, peeling, or loosening of the skin, including inside the mouth Sudden or severe stomach  pain, bloody diarrhea, fever, nausea, vomiting Side effects that usually do not require medical attention (report to your doctor or health care professional if they continue or are bothersome): Bone, joint, or muscle pain Diarrhea Fatigue Loss of appetite Nausea Skin rash This list may not describe all possible side effects. Call your doctor for medical advice about side effects. You may report side effects to FDA at 1-800-FDA-1088. Where should I keep my medication? This medication is given in a hospital or clinic. It will not be stored at home. NOTE: This sheet is a summary. It may not cover all possible information. If you have questions about this medicine, talk to your doctor, pharmacist, or health care provider.  2024 Elsevier/Gold Standard (2022-02-24 00:00:00)

## 2023-05-26 ENCOUNTER — Other Ambulatory Visit: Payer: Self-pay

## 2023-05-29 DIAGNOSIS — I629 Nontraumatic intracranial hemorrhage, unspecified: Secondary | ICD-10-CM | POA: Diagnosis not present

## 2023-05-29 DIAGNOSIS — C7951 Secondary malignant neoplasm of bone: Secondary | ICD-10-CM | POA: Diagnosis not present

## 2023-05-29 DIAGNOSIS — C8590 Non-Hodgkin lymphoma, unspecified, unspecified site: Secondary | ICD-10-CM | POA: Diagnosis not present

## 2023-05-29 DIAGNOSIS — C342 Malignant neoplasm of middle lobe, bronchus or lung: Secondary | ICD-10-CM | POA: Diagnosis not present

## 2023-05-30 ENCOUNTER — Other Ambulatory Visit: Payer: Self-pay

## 2023-06-01 ENCOUNTER — Inpatient Hospital Stay: Payer: No Typology Code available for payment source

## 2023-06-01 DIAGNOSIS — E876 Hypokalemia: Secondary | ICD-10-CM | POA: Diagnosis not present

## 2023-06-01 DIAGNOSIS — Z5112 Encounter for antineoplastic immunotherapy: Secondary | ICD-10-CM | POA: Diagnosis not present

## 2023-06-01 DIAGNOSIS — R8281 Pyuria: Secondary | ICD-10-CM | POA: Diagnosis not present

## 2023-06-01 DIAGNOSIS — I130 Hypertensive heart and chronic kidney disease with heart failure and stage 1 through stage 4 chronic kidney disease, or unspecified chronic kidney disease: Secondary | ICD-10-CM | POA: Diagnosis not present

## 2023-06-01 DIAGNOSIS — I5022 Chronic systolic (congestive) heart failure: Secondary | ICD-10-CM | POA: Diagnosis not present

## 2023-06-01 DIAGNOSIS — Z95828 Presence of other vascular implants and grafts: Secondary | ICD-10-CM

## 2023-06-01 DIAGNOSIS — J189 Pneumonia, unspecified organism: Secondary | ICD-10-CM | POA: Diagnosis not present

## 2023-06-01 DIAGNOSIS — Z7901 Long term (current) use of anticoagulants: Secondary | ICD-10-CM | POA: Diagnosis not present

## 2023-06-01 DIAGNOSIS — J9601 Acute respiratory failure with hypoxia: Secondary | ICD-10-CM | POA: Diagnosis not present

## 2023-06-01 DIAGNOSIS — B348 Other viral infections of unspecified site: Secondary | ICD-10-CM | POA: Diagnosis not present

## 2023-06-01 DIAGNOSIS — D631 Anemia in chronic kidney disease: Secondary | ICD-10-CM | POA: Diagnosis not present

## 2023-06-01 DIAGNOSIS — C342 Malignant neoplasm of middle lobe, bronchus or lung: Secondary | ICD-10-CM | POA: Diagnosis not present

## 2023-06-01 DIAGNOSIS — N179 Acute kidney failure, unspecified: Secondary | ICD-10-CM | POA: Diagnosis not present

## 2023-06-01 DIAGNOSIS — N1832 Chronic kidney disease, stage 3b: Secondary | ICD-10-CM | POA: Diagnosis not present

## 2023-06-01 DIAGNOSIS — D630 Anemia in neoplastic disease: Secondary | ICD-10-CM | POA: Diagnosis not present

## 2023-06-01 MED ORDER — HEPARIN SOD (PORK) LOCK FLUSH 100 UNIT/ML IV SOLN
500.0000 [IU] | Freq: Once | INTRAVENOUS | Status: AC
  Administered 2023-06-01: 500 [IU] via INTRAVENOUS

## 2023-06-01 MED ORDER — SODIUM CHLORIDE 0.9% FLUSH
10.0000 mL | INTRAVENOUS | Status: DC | PRN
  Administered 2023-06-01: 10 mL via INTRAVENOUS

## 2023-06-01 NOTE — Patient Instructions (Signed)
PICC Home Care Guide A peripherally inserted central catheter (PICC) is a form of IV access that allows medicines and IV fluids to be quickly put into the blood and spread throughout the body. The PICC is a long, thin, flexible tube (catheter) that is put into a vein in a person's arm or leg. The catheter ends in a large vein just outside the heart called the superior vena cava (SVC). After the PICC is put in, a chest X-ray may be done to make sure that it is in the right place. A PICC may be placed for different reasons, such as: To give medicines and liquid nutrition. To give IV fluids and blood products. To take blood samples often. If there is trouble placing a peripheral intravenous (PIV) catheter. If cared for properly, a PICC can remain in place for many months. Having a PICC can allow you to go home from the hospital sooner and continue treatment at home. Medicines and PICC care can be managed at home by a family member, caregiver, or home health care team. What are the risks? Generally, having a PICC is safe. However, problems may occur, including: A blood clot (thrombus) forming in or at the end of the PICC. A blood clot forming in a vein (deep vein thrombosis) or traveling to the lung (pulmonary embolism). Inflammation of the vein (phlebitis) in which the PICC is placed. Infection at the insertion site or in the blood. Blood infections from central lines, like PICCs, can be serious and often require a hospital stay. PICC malposition, or PICC movement or poor placement. A break or cut in the PICC. Do not use scissors near the PICC. Nerve or tendon irritation or injury during PICC insertion. How to care for your PICC Please follow the specific guidelines provided by your health care provider. Preventing infection You and any caregivers should wash your hands often with soap and water for at least 20 seconds. Wash hands: Before touching the PICC or the infusion device. Before changing a  bandage (dressing). Do not change the dressing unless you have been taught to do so and have shown you are able to change it safely. Flush the PICC as told. Tell your health care provider right away if the PICC is hard to flush or does not flush. Do not use force to flush the PICC. Use clean and germ-free (sterile) supplies only. Keep the supplies in a dry place. Do not reuse needles, syringes, or any other supplies. Reusing supplies can lead to infection. Keep the PICC dressing dry and secure it with tape if the edges stop sticking to your skin. Check your PICC insertion site every day for signs of infection. Check for: Redness, swelling, or pain. Fluid or blood. Warmth. Pus or a bad smell. Preventing other problems Do not use a syringe that is less than 10 mL to flush the PICC. Do not have your blood pressure checked on the arm in which the PICC is placed. Do not ever pull or tug on the PICC. Keep it secured to your arm with tape or a stretch wrap when not in use. Do not take the PICC out yourself. Only a trained health care provider should remove the PICC. Keep pets and children away from your PICC. How to care for your PICC dressing Keep your PICC dressing clean and dry to prevent infection. Do not take baths, swim, or use a hot tub until your health care provider approves. Ask your health care provider if you can take  showers. You may only be allowed to take sponge baths. When you are allowed to shower: Ask your health care provider to teach you how to wrap the PICC. Cover the PICC with clear plastic wrap and tape to keep it dry while showering. Follow instructions from your health care provider about how to take care of your insertion site and dressing. Make sure you: Wash your hands with soap and water for at least 20 seconds before and after you change your dressing. If soap and water are not available, use hand sanitizer. Change your dressing only if taught to do so by your health care  provider. Your PICC dressing needs to be changed if it becomes loose or wet. Leave stitches (sutures), skin glue, or adhesive strips in place. These skin closures may need to stay in place for 2 weeks or longer. If adhesive strip edges start to loosen and curl up, you may trim the loose edges. Do not remove adhesive strips completely unless your health care provider tells you to do that. Follow these instructions at home: Disposal of supplies Throw away any syringes in a disposal container that is meant for sharp items (sharps container). You can buy a sharps container from a pharmacy, or you can make one by using an empty, hard plastic bottle with a lid. Place any used dressings or infusion bags into a plastic bag. Throw that bag in the trash. General instructions  Always carry your PICC identification card or wear a medical alert bracelet. Keep the tube clamped at all times, unless it is being used. Always carry a smooth-edge clamp with you to clamp the PICC if it breaks. Do not use scissors or sharp objects near the tube. You may bend your arm and move it freely. If your PICC is near or at the bend of your elbow, avoid activity with repeated motion at the elbow. Avoid lifting heavy objects as told by your health care provider. Keep all follow-up visits. This is important. You will need to have your PICC dressing changed at least once a week. Contact a health care provider if: You have pain in your arm, ear, face, or teeth. You have a fever or chills. You have redness, swelling, or pain around the insertion site. You have fluid or blood coming from the insertion site. Your insertion site feels warm to the touch. You have pus or a bad smell coming from the insertion site. Your skin feels hard and raised around the insertion site. Your PICC dressing has gotten wet or is coming off and you have not been taught how to change it. Get help right away if: You have problems with your PICC, such as  your PICC: Was tugged or pulled and has partially come out. Do not  push the PICC back in. Cannot be flushed, is hard to flush, or leaks around the insertion site when it is flushed. Makes a flushing sound when it is flushed. Appears to have a hole or tear. Is accidentally pulled all the way out. If this happens, cover the insertion site with a gauze dressing. Do not throw the PICC away. Your health care provider will need to check it to be sure the entire catheter came out. You feel your heart racing or skipping beats, or you have chest pain. You have shortness of breath or trouble breathing. You have swelling, redness, warmth, or pain in the arm in which the PICC is placed. You have a red streak going up your arm that  starts under the PICC dressing. These symptoms may be an emergency. Get help right away. Call 911. Do not wait to see if the symptoms will go away. Do not drive yourself to the hospital. Summary A peripherally inserted central catheter (PICC) is a long, thin, flexible tube (catheter) that is put into a vein in the arm or leg. If cared for properly, a PICC can remain in place for many months. Having a PICC can allow you to go home from the hospital sooner and continue treatment at home. The PICC is inserted using a germ-free (sterile) technique by a specially trained health care provider. Only a trained health care provider should remove it. Do not have your blood pressure checked on the arm in which your PICC is placed. Always keep your PICC identification card with you. This information is not intended to replace advice given to you by your health care provider. Make sure you discuss any questions you have with your health care provider. Document Revised: 04/27/2021 Document Reviewed: 04/27/2021 Elsevier Patient Education  2024 [employer].

## 2023-06-06 DIAGNOSIS — I4711 Inappropriate sinus tachycardia, so stated: Secondary | ICD-10-CM | POA: Diagnosis not present

## 2023-06-06 DIAGNOSIS — Z86718 Personal history of other venous thrombosis and embolism: Secondary | ICD-10-CM | POA: Diagnosis not present

## 2023-06-06 DIAGNOSIS — C720 Malignant neoplasm of spinal cord: Secondary | ICD-10-CM | POA: Diagnosis not present

## 2023-06-06 DIAGNOSIS — C7800 Secondary malignant neoplasm of unspecified lung: Secondary | ICD-10-CM | POA: Diagnosis not present

## 2023-06-06 DIAGNOSIS — E876 Hypokalemia: Secondary | ICD-10-CM | POA: Diagnosis not present

## 2023-06-06 DIAGNOSIS — Z8572 Personal history of non-Hodgkin lymphomas: Secondary | ICD-10-CM | POA: Diagnosis not present

## 2023-06-06 DIAGNOSIS — Z7901 Long term (current) use of anticoagulants: Secondary | ICD-10-CM | POA: Diagnosis not present

## 2023-06-06 DIAGNOSIS — Z853 Personal history of malignant neoplasm of breast: Secondary | ICD-10-CM | POA: Diagnosis not present

## 2023-06-08 ENCOUNTER — Inpatient Hospital Stay: Payer: No Typology Code available for payment source

## 2023-06-08 DIAGNOSIS — Z5112 Encounter for antineoplastic immunotherapy: Secondary | ICD-10-CM | POA: Diagnosis not present

## 2023-06-08 NOTE — Patient Instructions (Signed)

## 2023-06-09 ENCOUNTER — Other Ambulatory Visit: Payer: Self-pay

## 2023-06-15 ENCOUNTER — Inpatient Hospital Stay: Payer: No Typology Code available for payment source

## 2023-06-15 ENCOUNTER — Encounter: Payer: Self-pay | Admitting: Nurse Practitioner

## 2023-06-15 ENCOUNTER — Other Ambulatory Visit: Payer: Self-pay

## 2023-06-15 ENCOUNTER — Inpatient Hospital Stay: Payer: No Typology Code available for payment source | Admitting: Nurse Practitioner

## 2023-06-15 DIAGNOSIS — C342 Malignant neoplasm of middle lobe, bronchus or lung: Secondary | ICD-10-CM

## 2023-06-15 DIAGNOSIS — C3490 Malignant neoplasm of unspecified part of unspecified bronchus or lung: Secondary | ICD-10-CM

## 2023-06-15 DIAGNOSIS — J9601 Acute respiratory failure with hypoxia: Secondary | ICD-10-CM | POA: Diagnosis not present

## 2023-06-15 DIAGNOSIS — J189 Pneumonia, unspecified organism: Secondary | ICD-10-CM | POA: Diagnosis not present

## 2023-06-15 DIAGNOSIS — Z5112 Encounter for antineoplastic immunotherapy: Secondary | ICD-10-CM | POA: Diagnosis not present

## 2023-06-15 DIAGNOSIS — I130 Hypertensive heart and chronic kidney disease with heart failure and stage 1 through stage 4 chronic kidney disease, or unspecified chronic kidney disease: Secondary | ICD-10-CM | POA: Diagnosis not present

## 2023-06-15 LAB — CMP (CANCER CENTER ONLY)
ALT: 15 U/L (ref 0–44)
AST: 18 U/L (ref 15–41)
Albumin: 3.3 g/dL — ABNORMAL LOW (ref 3.5–5.0)
Alkaline Phosphatase: 76 U/L (ref 38–126)
Anion gap: 6 (ref 5–15)
BUN: 23 mg/dL (ref 8–23)
CO2: 29 mmol/L (ref 22–32)
Calcium: 8.6 mg/dL — ABNORMAL LOW (ref 8.9–10.3)
Chloride: 106 mmol/L (ref 98–111)
Creatinine: 1.2 mg/dL — ABNORMAL HIGH (ref 0.44–1.00)
GFR, Estimated: 46 mL/min — ABNORMAL LOW (ref >60)
Glucose, Bld: 97 mg/dL (ref 70–99)
Potassium: 4.1 mmol/L (ref 3.5–5.1)
Sodium: 141 mmol/L (ref 135–145)
Total Bilirubin: 0.3 mg/dL (ref 0.3–1.2)
Total Protein: 6.2 g/dL — ABNORMAL LOW (ref 6.5–8.1)

## 2023-06-15 LAB — CBC WITH DIFFERENTIAL (CANCER CENTER ONLY)
Abs Immature Granulocytes: 0 10*3/uL (ref 0.00–0.07)
Basophils Absolute: 0 10*3/uL (ref 0.0–0.1)
Basophils Relative: 0 %
Eosinophils Absolute: 0 10*3/uL (ref 0.0–0.5)
Eosinophils Relative: 0 %
HCT: 35.4 % — ABNORMAL LOW (ref 36.0–46.0)
Hemoglobin: 11.4 g/dL — ABNORMAL LOW (ref 12.0–15.0)
Immature Granulocytes: 0 %
Lymphocytes Relative: 39 %
Lymphs Abs: 1.7 10*3/uL (ref 0.7–4.0)
MCH: 31.9 pg (ref 26.0–34.0)
MCHC: 32.2 g/dL (ref 30.0–36.0)
MCV: 99.2 fL (ref 80.0–100.0)
Monocytes Absolute: 0.4 10*3/uL (ref 0.1–1.0)
Monocytes Relative: 9 %
Neutro Abs: 2.3 10*3/uL (ref 1.7–7.7)
Neutrophils Relative %: 52 %
Platelet Count: 184 10*3/uL (ref 150–400)
RBC: 3.57 MIL/uL — ABNORMAL LOW (ref 3.87–5.11)
RDW: 13.9 % (ref 11.5–15.5)
WBC Count: 4.5 10*3/uL (ref 4.0–10.5)
nRBC: 0 % (ref 0.0–0.2)

## 2023-06-15 MED ORDER — HEPARIN SOD (PORK) LOCK FLUSH 100 UNIT/ML IV SOLN
250.0000 [IU] | Freq: Once | INTRAVENOUS | Status: AC | PRN
  Administered 2023-06-15: 250 [IU]

## 2023-06-15 MED ORDER — SODIUM CHLORIDE 0.9% FLUSH
10.0000 mL | INTRAVENOUS | Status: DC | PRN
  Administered 2023-06-15: 10 mL

## 2023-06-15 MED ORDER — SODIUM CHLORIDE 0.9 % IV SOLN
1200.0000 mg | Freq: Once | INTRAVENOUS | Status: AC
  Administered 2023-06-15: 1200 mg via INTRAVENOUS
  Filled 2023-06-15: qty 20

## 2023-06-15 MED ORDER — SODIUM CHLORIDE 0.9 % IV SOLN: Freq: Once | INTRAVENOUS | Status: AC

## 2023-06-15 MED ORDER — LORAZEPAM 0.5 MG PO TABS: 0.5000 mg | ORAL_TABLET | Freq: Two times a day (BID) | ORAL | Status: DC | PRN
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
## 2023-06-15 NOTE — Patient Instructions (Signed)
 [place] [facility]   Discharge Instructions: Thank you for choosing [facility] to provide your oncology and hematology care.   If you have a lab appointment with the Cancer Center, please go directly to the Cancer Center and check in at the registration area.   Wear comfortable clothing and clothing appropriate for easy access to any Portacath or PICC line.   We strive to give you quality time with your provider. You may need to reschedule your appointment if you arrive late (15 or more minutes).  Arriving late affects you and other patients whose appointments are after yours.  Also, if you miss three or more appointments without notifying the office, you may be dismissed from the clinic at the provider's discretion.      For prescription refill requests, have your pharmacy contact our office and allow 72 hours for refills to be completed.    Today you received the following chemotherapy and/or immunotherapy agents Atezolizumab (TECENTRIQ).      To help prevent nausea and vomiting after your treatment, we encourage you to take your nausea medication as directed.  BELOW ARE SYMPTOMS THAT SHOULD BE REPORTED IMMEDIATELY: *FEVER GREATER THAN 100.4 F (38 C) OR HIGHER *CHILLS OR SWEATING *NAUSEA AND VOMITING THAT IS NOT CONTROLLED WITH YOUR NAUSEA MEDICATION *UNUSUAL SHORTNESS OF BREATH *UNUSUAL BRUISING OR BLEEDING *URINARY PROBLEMS (pain or burning when urinating, or frequent urination) *BOWEL PROBLEMS (unusual diarrhea, constipation, pain near the anus) TENDERNESS IN MOUTH AND THROAT WITH OR WITHOUT PRESENCE OF ULCERS (sore throat, sores in mouth, or a toothache) UNUSUAL RASH, SWELLING OR PAIN  UNUSUAL VAGINAL DISCHARGE OR ITCHING   Items with * indicate a potential emergency and should be followed up as soon as possible or go to the Emergency Department if any problems should occur.  Please show the CHEMOTHERAPY ALERT CARD or IMMUNOTHERAPY  ALERT CARD at check-in to the Emergency Department and triage nurse.  Should you have questions after your visit or need to cancel or reschedule your appointment, please contact [facility] [facility]  Dept: [phone]  and follow the prompts.  Office hours are 8:00 a.m. to 4:30 p.m. Monday - Friday. Please note that voicemails left after 4:00 p.m. may not be returned until the following business day.  We are closed weekends and major holidays. You have access to a nurse at all times for urgent questions. Please call the main number to the clinic Dept: [phone] and follow the prompts.   For any non-urgent questions, you may also contact your provider using MyChart. We now offer e-Visits for anyone 71 and older to request care online for non-urgent symptoms. For details visit [email or web address].   Also download the MyChart app! Go to the app store, search "MyChart", open the app, select [place], and log in with your MyChart username and password.  Atezolizumab Injection What is this medication? ATEZOLIZUMAB (a te zoe LIZ ue mab) treats some types of cancer. It works by helping your immune system slow or stop the spread of cancer cells. It is a monoclonal antibody. This medicine may be used for other purposes; ask your health care provider or pharmacist if you have questions. COMMON BRAND NAME(S): Tecentriq What should I tell my care team before I take this medication? They need to know if you have any of these conditions: Allogeneic stem cell transplant (uses someone else's stem cells) Autoimmune diseases, such as Crohn disease, ulcerative colitis, lupus History of chest radiation  Nervous system problems, such as Guillain-Barre syndrome, myasthenia gravis Organ transplant An unusual or allergic reaction to atezolizumab, other medications, foods, dyes, or preservatives Pregnant or trying to get pregnant Breast-feeding How should I use this  medication? This medication is injected into a vein. It is given by your care team in a hospital or clinic setting. A special MedGuide will be given to you before each treatment. Be sure to read this information carefully each time. Talk to your care team about the use of this medication in children. While it may be prescribed for children as young as 2 years for selected conditions, precautions do apply. Overdosage: If you think you have taken too much of this medicine contact a poison control center or emergency room at once. NOTE: This medicine is only for you. Do not share this medicine with others. What if I miss a dose? Keep appointments for follow-up doses. It is important not to miss your dose. Call your care team if you are unable to keep an appointment. What may interact with this medication? Interactions have not been studied. This list may not describe all possible interactions. Give your health care provider a list of all the medicines, herbs, non-prescription drugs, or dietary supplements you use. Also tell them if you smoke, drink alcohol, or use illegal drugs. Some items may interact with your medicine. What should I watch for while using this medication? Your condition will be monitored carefully while you are receiving this medication. You may need blood work while taking this medication. This medication may cause serious skin reactions. They can happen weeks to months after starting the medication. Contact your care team right away if you notice fevers or flu-like symptoms with a rash. The rash may be red or purple and then turn into blisters or peeling of the skin. You may also notice a red rash with swelling of the face, lips, or lymph nodes in your neck or under your arms. Tell your care team right away if you have any change in your eyesight. Talk to your care team if you may be pregnant. Serious birth defects can occur if you take this medication during pregnancy and for 5  months after the last dose. You will need a negative pregnancy test before starting this medication. Contraception is recommended while taking this medication and for 5 months after the last dose. Your care team can help you find the option that works for you. Do not breastfeed while taking this medication and for at least 5 months after the last dose. What side effects may I notice from receiving this medication? Side effects that you should report to your doctor or health care professional as soon as possible: Allergic reactions--skin rash, itching, hives, swelling of the face, lips, tongue, or throat Dry cough, shortness of breath or trouble breathing Eye pain, redness, irritation, or discharge with blurry or decreased vision Heart muscle inflammation--unusual weakness or fatigue, shortness of breath, chest pain, fast or irregular heartbeat, dizziness, swelling of the ankles, feet, or hands Hormone gland problems--headache, sensitivity to light, unusual weakness or fatigue, dizziness, fast or irregular heartbeat, increased sensitivity to cold or heat, excessive sweating, constipation, hair loss, increased thirst or amount of urine, tremors or shaking, irritability Infusion reactions--chest pain, shortness of breath or trouble breathing, feeling faint or lightheaded Kidney injury (glomerulonephritis)--decrease in the amount of urine, red or dark brown urine, foamy or bubbly urine, swelling of the ankles, hands, or feet Liver injury--right upper belly pain, loss of appetite,  nausea, light-colored stool, dark yellow or brown urine, yellowing skin or eyes, unusual weakness or fatigue Pain, tingling, or numbness in the hands or feet, muscle weakness, change in vision, confusion or trouble speaking, loss of balance or coordination, trouble walking, seizures Rash, fever, and swollen lymph nodes Redness, blistering, peeling, or loosening of the skin, including inside the mouth Sudden or severe stomach  pain, bloody diarrhea, fever, nausea, vomiting Side effects that usually do not require medical attention (report to your doctor or health care professional if they continue or are bothersome): Bone, joint, or muscle pain Diarrhea Fatigue Loss of appetite Nausea Skin rash This list may not describe all possible side effects. Call your doctor for medical advice about side effects. You may report side effects to FDA at 1-800-FDA-1088. Where should I keep my medication? This medication is given in a hospital or clinic. It will not be stored at home. NOTE: This sheet is a summary. It may not cover all possible information. If you have questions about this medicine, talk to your doctor, pharmacist, or health care provider.  2024 Elsevier/Gold Standard (2022-02-24 00:00:00)

## 2023-06-15 NOTE — Progress Notes (Signed)
Patient seen by Lisa Thomas NP today  Vitals are within treatment parameters.  Labs reviewed by Lisa Thomas NP and are within treatment parameters.  Per physician team, patient is ready for treatment and there are NO modifications to the treatment plan.     

## 2023-06-15 NOTE — Patient Instructions (Signed)
PICC Home Care Guide A peripherally inserted central catheter (PICC) is a form of IV access that allows medicines and IV fluids to be quickly put into the blood and spread throughout the body. The PICC is a long, thin, flexible tube (catheter) that is put into a vein in a person's arm or leg. The catheter ends in a large vein just outside the heart called the superior vena cava (SVC). After the PICC is put in, a chest X-ray may be done to make sure that it is in the right place. A PICC may be placed for different reasons, such as: To give medicines and liquid nutrition. To give IV fluids and blood products. To take blood samples often. If there is trouble placing a peripheral intravenous (PIV) catheter. If cared for properly, a PICC can remain in place for many months. Having a PICC can allow you to go home from the hospital sooner and continue treatment at home. Medicines and PICC care can be managed at home by a family member, caregiver, or home health care team. What are the risks? Generally, having a PICC is safe. However, problems may occur, including: A blood clot (thrombus) forming in or at the end of the PICC. A blood clot forming in a vein (deep vein thrombosis) or traveling to the lung (pulmonary embolism). Inflammation of the vein (phlebitis) in which the PICC is placed. Infection at the insertion site or in the blood. Blood infections from central lines, like PICCs, can be serious and often require a hospital stay. PICC malposition, or PICC movement or poor placement. A break or cut in the PICC. Do not use scissors near the PICC. Nerve or tendon irritation or injury during PICC insertion. How to care for your PICC Please follow the specific guidelines provided by your health care provider. Preventing infection You and any caregivers should wash your hands often with soap and water for at least 20 seconds. Wash hands: Before touching the PICC or the infusion device. Before changing a  bandage (dressing). Do not change the dressing unless you have been taught to do so and have shown you are able to change it safely. Flush the PICC as told. Tell your health care provider right away if the PICC is hard to flush or does not flush. Do not use force to flush the PICC. Use clean and germ-free (sterile) supplies only. Keep the supplies in a dry place. Do not reuse needles, syringes, or any other supplies. Reusing supplies can lead to infection. Keep the PICC dressing dry and secure it with tape if the edges stop sticking to your skin. Check your PICC insertion site every day for signs of infection. Check for: Redness, swelling, or pain. Fluid or blood. Warmth. Pus or a bad smell. Preventing other problems Do not use a syringe that is less than 10 mL to flush the PICC. Do not have your blood pressure checked on the arm in which the PICC is placed. Do not ever pull or tug on the PICC. Keep it secured to your arm with tape or a stretch wrap when not in use. Do not take the PICC out yourself. Only a trained health care provider should remove the PICC. Keep pets and children away from your PICC. How to care for your PICC dressing Keep your PICC dressing clean and dry to prevent infection. Do not take baths, swim, or use a hot tub until your health care provider approves. Ask your health care provider if you can take  showers. You may only be allowed to take sponge baths. When you are allowed to shower: Ask your health care provider to teach you how to wrap the PICC. Cover the PICC with clear plastic wrap and tape to keep it dry while showering. Follow instructions from your health care provider about how to take care of your insertion site and dressing. Make sure you: Wash your hands with soap and water for at least 20 seconds before and after you change your dressing. If soap and water are not available, use hand sanitizer. Change your dressing only if taught to do so by your health care  provider. Your PICC dressing needs to be changed if it becomes loose or wet. Leave stitches (sutures), skin glue, or adhesive strips in place. These skin closures may need to stay in place for 2 weeks or longer. If adhesive strip edges start to loosen and curl up, you may trim the loose edges. Do not remove adhesive strips completely unless your health care provider tells you to do that. Follow these instructions at home: Disposal of supplies Throw away any syringes in a disposal container that is meant for sharp items (sharps container). You can buy a sharps container from a pharmacy, or you can make one by using an empty, hard plastic bottle with a lid. Place any used dressings or infusion bags into a plastic bag. Throw that bag in the trash. General instructions  Always carry your PICC identification card or wear a medical alert bracelet. Keep the tube clamped at all times, unless it is being used. Always carry a smooth-edge clamp with you to clamp the PICC if it breaks. Do not use scissors or sharp objects near the tube. You may bend your arm and move it freely. If your PICC is near or at the bend of your elbow, avoid activity with repeated motion at the elbow. Avoid lifting heavy objects as told by your health care provider. Keep all follow-up visits. This is important. You will need to have your PICC dressing changed at least once a week. Contact a health care provider if: You have pain in your arm, ear, face, or teeth. You have a fever or chills. You have redness, swelling, or pain around the insertion site. You have fluid or blood coming from the insertion site. Your insertion site feels warm to the touch. You have pus or a bad smell coming from the insertion site. Your skin feels hard and raised around the insertion site. Your PICC dressing has gotten wet or is coming off and you have not been taught how to change it. Get help right away if: You have problems with your PICC, such as  your PICC: Was tugged or pulled and has partially come out. Do not  push the PICC back in. Cannot be flushed, is hard to flush, or leaks around the insertion site when it is flushed. Makes a flushing sound when it is flushed. Appears to have a hole or tear. Is accidentally pulled all the way out. If this happens, cover the insertion site with a gauze dressing. Do not throw the PICC away. Your health care provider will need to check it to be sure the entire catheter came out. You feel your heart racing or skipping beats, or you have chest pain. You have shortness of breath or trouble breathing. You have swelling, redness, warmth, or pain in the arm in which the PICC is placed. You have a red streak going up your arm that  starts under the PICC dressing. These symptoms may be an emergency. Get help right away. Call 911. Do not wait to see if the symptoms will go away. Do not drive yourself to the hospital. Summary A peripherally inserted central catheter (PICC) is a long, thin, flexible tube (catheter) that is put into a vein in the arm or leg. If cared for properly, a PICC can remain in place for many months. Having a PICC can allow you to go home from the hospital sooner and continue treatment at home. The PICC is inserted using a germ-free (sterile) technique by a specially trained health care provider. Only a trained health care provider should remove it. Do not have your blood pressure checked on the arm in which your PICC is placed. Always keep your PICC identification card with you. This information is not intended to replace advice given to you by your health care provider. Make sure you discuss any questions you have with your health care provider. Document Revised: 04/27/2021 Document Reviewed: 04/27/2021 Elsevier Patient Education  2024 [employer].

## 2023-06-18 DIAGNOSIS — C3491 Malignant neoplasm of unspecified part of right bronchus or lung: Secondary | ICD-10-CM | POA: Diagnosis not present

## 2023-06-18 DIAGNOSIS — I10 Essential (primary) hypertension: Secondary | ICD-10-CM | POA: Diagnosis not present

## 2023-06-21 DIAGNOSIS — J910 Malignant pleural effusion: Secondary | ICD-10-CM | POA: Diagnosis not present

## 2023-06-21 DIAGNOSIS — J9601 Acute respiratory failure with hypoxia: Secondary | ICD-10-CM | POA: Diagnosis not present

## 2023-06-22 ENCOUNTER — Inpatient Hospital Stay: Payer: No Typology Code available for payment source

## 2023-06-22 VITALS — BP 106/77 | HR 94 | Temp 98.2°F | Resp 18

## 2023-06-22 DIAGNOSIS — Z452 Encounter for adjustment and management of vascular access device: Secondary | ICD-10-CM

## 2023-06-22 DIAGNOSIS — Z5112 Encounter for antineoplastic immunotherapy: Secondary | ICD-10-CM | POA: Diagnosis not present

## 2023-06-22 MED ORDER — SODIUM CHLORIDE 0.9% FLUSH
10.0000 mL | Freq: Once | INTRAVENOUS | Status: AC
  Administered 2023-06-22: 10 mL via INTRAVENOUS

## 2023-06-22 MED ORDER — HEPARIN SOD (PORK) LOCK FLUSH 100 UNIT/ML IV SOLN
250.0000 [IU] | Freq: Once | INTRAVENOUS | Status: AC
  Administered 2023-06-22: 250 [IU] via INTRAVENOUS

## 2023-06-29 ENCOUNTER — Inpatient Hospital Stay: Payer: No Typology Code available for payment source | Attending: Oncology

## 2023-06-29 VITALS — BP 120/80 | HR 76 | Temp 98.2°F | Resp 18

## 2023-06-29 DIAGNOSIS — I629 Nontraumatic intracranial hemorrhage, unspecified: Secondary | ICD-10-CM | POA: Diagnosis not present

## 2023-06-29 DIAGNOSIS — Z5112 Encounter for antineoplastic immunotherapy: Secondary | ICD-10-CM | POA: Diagnosis not present

## 2023-06-29 DIAGNOSIS — C342 Malignant neoplasm of middle lobe, bronchus or lung: Secondary | ICD-10-CM | POA: Insufficient documentation

## 2023-06-29 DIAGNOSIS — Z95828 Presence of other vascular implants and grafts: Secondary | ICD-10-CM

## 2023-06-29 DIAGNOSIS — C8590 Non-Hodgkin lymphoma, unspecified, unspecified site: Secondary | ICD-10-CM | POA: Diagnosis not present

## 2023-06-29 DIAGNOSIS — Z452 Encounter for adjustment and management of vascular access device: Secondary | ICD-10-CM | POA: Diagnosis not present

## 2023-06-29 DIAGNOSIS — C7951 Secondary malignant neoplasm of bone: Secondary | ICD-10-CM | POA: Diagnosis not present

## 2023-06-29 MED ORDER — HEPARIN SOD (PORK) LOCK FLUSH 100 UNIT/ML IV SOLN
500.0000 [IU] | Freq: Once | INTRAVENOUS | Status: AC
  Administered 2023-06-29: 500 [IU] via INTRAVENOUS

## 2023-06-29 MED ORDER — SODIUM CHLORIDE 0.9% FLUSH
10.0000 mL | INTRAVENOUS | Status: DC | PRN
  Administered 2023-06-29: 10 mL via INTRAVENOUS

## 2023-06-29 NOTE — Patient Instructions (Signed)
PICC Home Care Guide A peripherally inserted central catheter (PICC) is a form of IV access that allows medicines and IV fluids to be quickly put into the blood and spread throughout the body. The PICC is a long, thin, flexible tube (catheter) that is put into a vein in a person's arm or leg. The catheter ends in a large vein just outside the heart called the superior vena cava (SVC). After the PICC is put in, a chest X-ray may be done to make sure that it is in the right place. A PICC may be placed for different reasons, such as: To give medicines and liquid nutrition. To give IV fluids and blood products. To take blood samples often. If there is trouble placing a peripheral intravenous (PIV) catheter. If cared for properly, a PICC can remain in place for many months. Having a PICC can allow you to go home from the hospital sooner and continue treatment at home. Medicines and PICC care can be managed at home by a family member, caregiver, or home health care team. What are the risks? Generally, having a PICC is safe. However, problems may occur, including: A blood clot (thrombus) forming in or at the end of the PICC. A blood clot forming in a vein (deep vein thrombosis) or traveling to the lung (pulmonary embolism). Inflammation of the vein (phlebitis) in which the PICC is placed. Infection at the insertion site or in the blood. Blood infections from central lines, like PICCs, can be serious and often require a hospital stay. PICC malposition, or PICC movement or poor placement. A break or cut in the PICC. Do not use scissors near the PICC. Nerve or tendon irritation or injury during PICC insertion. How to care for your PICC Please follow the specific guidelines provided by your health care provider. Preventing infection You and any caregivers should wash your hands often with soap and water for at least 20 seconds. Wash hands: Before touching the PICC or the infusion device. Before changing a  bandage (dressing). Do not change the dressing unless you have been taught to do so and have shown you are able to change it safely. Flush the PICC as told. Tell your health care provider right away if the PICC is hard to flush or does not flush. Do not use force to flush the PICC. Use clean and germ-free (sterile) supplies only. Keep the supplies in a dry place. Do not reuse needles, syringes, or any other supplies. Reusing supplies can lead to infection. Keep the PICC dressing dry and secure it with tape if the edges stop sticking to your skin. Check your PICC insertion site every day for signs of infection. Check for: Redness, swelling, or pain. Fluid or blood. Warmth. Pus or a bad smell. Preventing other problems Do not use a syringe that is less than 10 mL to flush the PICC. Do not have your blood pressure checked on the arm in which the PICC is placed. Do not ever pull or tug on the PICC. Keep it secured to your arm with tape or a stretch wrap when not in use. Do not take the PICC out yourself. Only a trained health care provider should remove the PICC. Keep pets and children away from your PICC. How to care for your PICC dressing Keep your PICC dressing clean and dry to prevent infection. Do not take baths, swim, or use a hot tub until your health care provider approves. Ask your health care provider if you can take  showers. You may only be allowed to take sponge baths. When you are allowed to shower: Ask your health care provider to teach you how to wrap the PICC. Cover the PICC with clear plastic wrap and tape to keep it dry while showering. Follow instructions from your health care provider about how to take care of your insertion site and dressing. Make sure you: Wash your hands with soap and water for at least 20 seconds before and after you change your dressing. If soap and water are not available, use hand sanitizer. Change your dressing only if taught to do so by your health care  provider. Your PICC dressing needs to be changed if it becomes loose or wet. Leave stitches (sutures), skin glue, or adhesive strips in place. These skin closures may need to stay in place for 2 weeks or longer. If adhesive strip edges start to loosen and curl up, you may trim the loose edges. Do not remove adhesive strips completely unless your health care provider tells you to do that. Follow these instructions at home: Disposal of supplies Throw away any syringes in a disposal container that is meant for sharp items (sharps container). You can buy a sharps container from a pharmacy, or you can make one by using an empty, hard plastic bottle with a lid. Place any used dressings or infusion bags into a plastic bag. Throw that bag in the trash. General instructions  Always carry your PICC identification card or wear a medical alert bracelet. Keep the tube clamped at all times, unless it is being used. Always carry a smooth-edge clamp with you to clamp the PICC if it breaks. Do not use scissors or sharp objects near the tube. You may bend your arm and move it freely. If your PICC is near or at the bend of your elbow, avoid activity with repeated motion at the elbow. Avoid lifting heavy objects as told by your health care provider. Keep all follow-up visits. This is important. You will need to have your PICC dressing changed at least once a week. Contact a health care provider if: You have pain in your arm, ear, face, or teeth. You have a fever or chills. You have redness, swelling, or pain around the insertion site. You have fluid or blood coming from the insertion site. Your insertion site feels warm to the touch. You have pus or a bad smell coming from the insertion site. Your skin feels hard and raised around the insertion site. Your PICC dressing has gotten wet or is coming off and you have not been taught how to change it. Get help right away if: You have problems with your PICC, such as  your PICC: Was tugged or pulled and has partially come out. Do not  push the PICC back in. Cannot be flushed, is hard to flush, or leaks around the insertion site when it is flushed. Makes a flushing sound when it is flushed. Appears to have a hole or tear. Is accidentally pulled all the way out. If this happens, cover the insertion site with a gauze dressing. Do not throw the PICC away. Your health care provider will need to check it to be sure the entire catheter came out. You feel your heart racing or skipping beats, or you have chest pain. You have shortness of breath or trouble breathing. You have swelling, redness, warmth, or pain in the arm in which the PICC is placed. You have a red streak going up your arm that  starts under the PICC dressing. These symptoms may be an emergency. Get help right away. Call 911. Do not wait to see if the symptoms will go away. Do not drive yourself to the hospital. Summary A peripherally inserted central catheter (PICC) is a long, thin, flexible tube (catheter) that is put into a vein in the arm or leg. If cared for properly, a PICC can remain in place for many months. Having a PICC can allow you to go home from the hospital sooner and continue treatment at home. The PICC is inserted using a germ-free (sterile) technique by a specially trained health care provider. Only a trained health care provider should remove it. Do not have your blood pressure checked on the arm in which your PICC is placed. Always keep your PICC identification card with you. This information is not intended to replace advice given to you by your health care provider. Make sure you discuss any questions you have with your health care provider. Document Revised: 04/27/2021 Document Reviewed: 04/27/2021 Elsevier Patient Education  2024 [employer].

## 2023-07-01 ENCOUNTER — Other Ambulatory Visit: Payer: Self-pay | Admitting: Oncology

## 2023-07-01 DIAGNOSIS — C342 Malignant neoplasm of middle lobe, bronchus or lung: Secondary | ICD-10-CM

## 2023-07-01 DIAGNOSIS — C3490 Malignant neoplasm of unspecified part of unspecified bronchus or lung: Secondary | ICD-10-CM

## 2023-07-03 DIAGNOSIS — Z86718 Personal history of other venous thrombosis and embolism: Secondary | ICD-10-CM | POA: Diagnosis not present

## 2023-07-03 DIAGNOSIS — Z79899 Other long term (current) drug therapy: Secondary | ICD-10-CM | POA: Diagnosis not present

## 2023-07-03 DIAGNOSIS — C3491 Malignant neoplasm of unspecified part of right bronchus or lung: Secondary | ICD-10-CM | POA: Diagnosis not present

## 2023-07-03 DIAGNOSIS — F411 Generalized anxiety disorder: Secondary | ICD-10-CM | POA: Diagnosis not present

## 2023-07-03 DIAGNOSIS — I130 Hypertensive heart and chronic kidney disease with heart failure and stage 1 through stage 4 chronic kidney disease, or unspecified chronic kidney disease: Secondary | ICD-10-CM | POA: Diagnosis not present

## 2023-07-03 DIAGNOSIS — Z7901 Long term (current) use of anticoagulants: Secondary | ICD-10-CM | POA: Diagnosis not present

## 2023-07-06 ENCOUNTER — Encounter: Payer: Self-pay | Admitting: Nurse Practitioner

## 2023-07-06 ENCOUNTER — Other Ambulatory Visit: Payer: Self-pay

## 2023-07-06 ENCOUNTER — Inpatient Hospital Stay: Payer: No Typology Code available for payment source

## 2023-07-06 ENCOUNTER — Inpatient Hospital Stay: Payer: No Typology Code available for payment source | Admitting: Nurse Practitioner

## 2023-07-06 VITALS — BP 120/67 | HR 85 | Resp 18

## 2023-07-06 VITALS — BP 118/89 | HR 81 | Temp 98.1°F | Resp 18 | Ht 65.0 in | Wt 201.0 lb

## 2023-07-06 DIAGNOSIS — C342 Malignant neoplasm of middle lobe, bronchus or lung: Secondary | ICD-10-CM

## 2023-07-06 DIAGNOSIS — C3490 Malignant neoplasm of unspecified part of unspecified bronchus or lung: Secondary | ICD-10-CM

## 2023-07-06 DIAGNOSIS — Z5112 Encounter for antineoplastic immunotherapy: Secondary | ICD-10-CM | POA: Diagnosis not present

## 2023-07-06 LAB — CBC WITH DIFFERENTIAL (CANCER CENTER ONLY)
Abs Immature Granulocytes: 0.01 10*3/uL (ref 0.00–0.07)
Basophils Absolute: 0 10*3/uL (ref 0.0–0.1)
Basophils Relative: 1 %
Eosinophils Absolute: 0 10*3/uL (ref 0.0–0.5)
Eosinophils Relative: 0 %
HCT: 37 % (ref 36.0–46.0)
Hemoglobin: 11.9 g/dL — ABNORMAL LOW (ref 12.0–15.0)
Immature Granulocytes: 0 %
Lymphocytes Relative: 40 %
Lymphs Abs: 1.6 10*3/uL (ref 0.7–4.0)
MCH: 31.4 pg (ref 26.0–34.0)
MCHC: 32.2 g/dL (ref 30.0–36.0)
MCV: 97.6 fL (ref 80.0–100.0)
Monocytes Absolute: 0.4 10*3/uL (ref 0.1–1.0)
Monocytes Relative: 10 %
Neutro Abs: 2.1 10*3/uL (ref 1.7–7.7)
Neutrophils Relative %: 49 %
Platelet Count: 166 10*3/uL (ref 150–400)
RBC: 3.79 MIL/uL — ABNORMAL LOW (ref 3.87–5.11)
RDW: 13.9 % (ref 11.5–15.5)
WBC Count: 4.1 10*3/uL (ref 4.0–10.5)
nRBC: 0 % (ref 0.0–0.2)

## 2023-07-06 LAB — CMP (CANCER CENTER ONLY)
ALT: 13 U/L (ref 0–44)
AST: 18 U/L (ref 15–41)
Albumin: 3.3 g/dL — ABNORMAL LOW (ref 3.5–5.0)
Alkaline Phosphatase: 84 U/L (ref 38–126)
Anion gap: 6 (ref 5–15)
BUN: 21 mg/dL (ref 8–23)
CO2: 29 mmol/L (ref 22–32)
Calcium: 8.8 mg/dL — ABNORMAL LOW (ref 8.9–10.3)
Chloride: 106 mmol/L (ref 98–111)
Creatinine: 1.13 mg/dL — ABNORMAL HIGH (ref 0.44–1.00)
GFR, Estimated: 50 mL/min — ABNORMAL LOW (ref >60)
Glucose, Bld: 98 mg/dL (ref 70–99)
Potassium: 4.3 mmol/L (ref 3.5–5.1)
Sodium: 141 mmol/L (ref 135–145)
Total Bilirubin: 0.4 mg/dL (ref 0.3–1.2)
Total Protein: 6.4 g/dL — ABNORMAL LOW (ref 6.5–8.1)

## 2023-07-06 MED ORDER — SODIUM CHLORIDE 0.9 % IV SOLN: Freq: Once | INTRAVENOUS | Status: AC

## 2023-07-06 MED ORDER — SODIUM CHLORIDE 0.9 % IV SOLN
1200.0000 mg | Freq: Once | INTRAVENOUS | Status: AC
  Administered 2023-07-06: 1200 mg via INTRAVENOUS
  Filled 2023-07-06: qty 20

## 2023-07-06 MED ORDER — HEPARIN SOD (PORK) LOCK FLUSH 100 UNIT/ML IV SOLN
500.0000 [IU] | Freq: Once | INTRAVENOUS | Status: AC | PRN
  Administered 2023-07-06: 250 [IU]

## 2023-07-06 MED ORDER — SODIUM CHLORIDE 0.9% FLUSH
10.0000 mL | INTRAVENOUS | Status: DC | PRN
  Administered 2023-07-06: 10 mL

## 2023-07-06 NOTE — Progress Notes (Signed)
Patient seen by Lonna Cobb NP today  Vitals are within treatment parameters.  Labs reviewed by Lonna Cobb NP and are within treatment parameters.  Per physician team, patient is ready for treatment and there are NO modifications to the treatment plan.
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
## 2023-07-06 NOTE — Patient Instructions (Signed)
[facility]   Discharge Instructions: Thank you for choosing [facility] to provide your oncology and hematology care.   If you have a lab appointment with the Cancer Center, please go directly to the Cancer Center and check in at the registration area.   Wear comfortable clothing and clothing appropriate for easy access to any Portacath or PICC line.   We strive to give you quality time with your provider. You may need to reschedule your appointment if you arrive late (15 or more minutes).  Arriving late affects you and other patients whose appointments are after yours.  Also, if you miss three or more appointments without notifying the office, you may be dismissed from the clinic at the provider's discretion.      For prescription refill requests, have your pharmacy contact our office and allow 72 hours for refills to be completed.    Today you received the following chemotherapy and/or immunotherapy agents Atezolizumab (TECENTRIQ).      To help prevent nausea and vomiting after your treatment, we encourage you to take your nausea medication as directed.  BELOW ARE SYMPTOMS THAT SHOULD BE REPORTED IMMEDIATELY: *FEVER GREATER THAN 100.4 F (38 C) OR HIGHER *CHILLS OR SWEATING *NAUSEA AND VOMITING THAT IS NOT CONTROLLED WITH YOUR NAUSEA MEDICATION *UNUSUAL SHORTNESS OF BREATH *UNUSUAL BRUISING OR BLEEDING *URINARY PROBLEMS (pain or burning when urinating, or frequent urination) *BOWEL PROBLEMS (unusual diarrhea, constipation, pain near the anus) TENDERNESS IN MOUTH AND THROAT WITH OR WITHOUT PRESENCE OF ULCERS (sore throat, sores in mouth, or a toothache) UNUSUAL RASH, SWELLING OR PAIN  UNUSUAL VAGINAL DISCHARGE OR ITCHING   Items with * indicate a potential emergency and should be followed up as soon as possible or go to the Emergency Department if any problems should occur.  Please show the CHEMOTHERAPY ALERT CARD or IMMUNOTHERAPY  ALERT CARD at check-in to the Emergency Department and triage nurse.  Should you have questions after your visit or need to cancel or reschedule your appointment, please contact [facility]  Dept: [phone]  and follow the prompts.  Office hours are 8:00 a.m. to 4:30 p.m. Monday - Friday. Please note that voicemails left after 4:00 p.m. may not be returned until the following business day.  We are closed weekends and major holidays. You have access to a nurse at all times for urgent questions. Please call the main number to the clinic Dept: [phone] and follow the prompts.   For any non-urgent questions, you may also contact your provider using MyChart. We now offer e-Visits for anyone 60 and older to request care online for non-urgent symptoms. For details visit [email or web address].   Also download the MyChart app! Go to the app store, search "MyChart", open the app, select [place], and log in with your MyChart username and password.  Atezolizumab Injection What is this medication? ATEZOLIZUMAB (a te zoe LIZ ue mab) treats some types of cancer. It works by helping your immune system slow or stop the spread of cancer cells. It is a monoclonal antibody. This medicine may be used for other purposes; ask your health care provider or pharmacist if you have questions. COMMON BRAND NAME(S): Tecentriq What should I tell my care team before I take this medication? They need to know if you have any of these conditions: Allogeneic stem cell transplant (uses someone else's stem cells) Autoimmune diseases, such as Crohn disease, ulcerative colitis, lupus History of chest radiation  Nervous system problems, such as Guillain-Barre syndrome, myasthenia gravis Organ transplant An unusual or allergic reaction to atezolizumab, other medications, foods, dyes, or preservatives Pregnant or trying to get pregnant Breast-feeding How should I use this  medication? This medication is injected into a vein. It is given by your care team in a hospital or clinic setting. A special MedGuide will be given to you before each treatment. Be sure to read this information carefully each time. Talk to your care team about the use of this medication in children. While it may be prescribed for children as young as 2 years for selected conditions, precautions do apply. Overdosage: If you think you have taken too much of this medicine contact a poison control center or emergency room at once. NOTE: This medicine is only for you. Do not share this medicine with others. What if I miss a dose? Keep appointments for follow-up doses. It is important not to miss your dose. Call your care team if you are unable to keep an appointment. What may interact with this medication? Interactions have not been studied. This list may not describe all possible interactions. Give your health care provider a list of all the medicines, herbs, non-prescription drugs, or dietary supplements you use. Also tell them if you smoke, drink alcohol, or use illegal drugs. Some items may interact with your medicine. What should I watch for while using this medication? Your condition will be monitored carefully while you are receiving this medication. You may need blood work while taking this medication. This medication may cause serious skin reactions. They can happen weeks to months after starting the medication. Contact your care team right away if you notice fevers or flu-like symptoms with a rash. The rash may be red or purple and then turn into blisters or peeling of the skin. You may also notice a red rash with swelling of the face, lips, or lymph nodes in your neck or under your arms. Tell your care team right away if you have any change in your eyesight. Talk to your care team if you may be pregnant. Serious birth defects can occur if you take this medication during pregnancy and for 5  months after the last dose. You will need a negative pregnancy test before starting this medication. Contraception is recommended while taking this medication and for 5 months after the last dose. Your care team can help you find the option that works for you. Do not breastfeed while taking this medication and for at least 5 months after the last dose. What side effects may I notice from receiving this medication? Side effects that you should report to your doctor or health care professional as soon as possible: Allergic reactions--skin rash, itching, hives, swelling of the face, lips, tongue, or throat Dry cough, shortness of breath or trouble breathing Eye pain, redness, irritation, or discharge with blurry or decreased vision Heart muscle inflammation--unusual weakness or fatigue, shortness of breath, chest pain, fast or irregular heartbeat, dizziness, swelling of the ankles, feet, or hands Hormone gland problems--headache, sensitivity to light, unusual weakness or fatigue, dizziness, fast or irregular heartbeat, increased sensitivity to cold or heat, excessive sweating, constipation, hair loss, increased thirst or amount of urine, tremors or shaking, irritability Infusion reactions--chest pain, shortness of breath or trouble breathing, feeling faint or lightheaded Kidney injury (glomerulonephritis)--decrease in the amount of urine, red or dark brown urine, foamy or bubbly urine, swelling of the ankles, hands, or feet Liver injury--right upper belly pain, loss of appetite,  nausea, light-colored stool, dark yellow or brown urine, yellowing skin or eyes, unusual weakness or fatigue Pain, tingling, or numbness in the hands or feet, muscle weakness, change in vision, confusion or trouble speaking, loss of balance or coordination, trouble walking, seizures Rash, fever, and swollen lymph nodes Redness, blistering, peeling, or loosening of the skin, including inside the mouth Sudden or severe stomach  pain, bloody diarrhea, fever, nausea, vomiting Side effects that usually do not require medical attention (report to your doctor or health care professional if they continue or are bothersome): Bone, joint, or muscle pain Diarrhea Fatigue Loss of appetite Nausea Skin rash This list may not describe all possible side effects. Call your doctor for medical advice about side effects. You may report side effects to FDA at 1-800-FDA-1088. Where should I keep my medication? This medication is given in a hospital or clinic. It will not be stored at home. NOTE: This sheet is a summary. It may not cover all possible information. If you have questions about this medicine, talk to your doctor, pharmacist, or health care provider.  2024 Elsevier/Gold Standard (2022-02-24 00:00:00)

## 2023-07-07 ENCOUNTER — Other Ambulatory Visit: Payer: Self-pay

## 2023-07-11 ENCOUNTER — Other Ambulatory Visit: Payer: Self-pay

## 2023-07-13 ENCOUNTER — Inpatient Hospital Stay: Payer: No Typology Code available for payment source

## 2023-07-13 DIAGNOSIS — Z5112 Encounter for antineoplastic immunotherapy: Secondary | ICD-10-CM | POA: Diagnosis not present

## 2023-07-13 NOTE — Patient Instructions (Signed)

## 2023-07-19 ENCOUNTER — Telehealth: Payer: Self-pay | Admitting: *Deleted

## 2023-07-19 NOTE — Telephone Encounter (Signed)
Reports she will not be coming on 07/20/23 for PICC dressing change due to weather. Asking to reschedule this for Monday. They will flush her PICC tomorrow. Scheduling message sent.

## 2023-07-20 ENCOUNTER — Inpatient Hospital Stay: Payer: No Typology Code available for payment source

## 2023-07-21 ENCOUNTER — Other Ambulatory Visit: Payer: Self-pay | Admitting: Oncology

## 2023-07-22 DIAGNOSIS — J910 Malignant pleural effusion: Secondary | ICD-10-CM | POA: Diagnosis not present

## 2023-07-22 DIAGNOSIS — J9601 Acute respiratory failure with hypoxia: Secondary | ICD-10-CM | POA: Diagnosis not present

## 2023-07-23 ENCOUNTER — Inpatient Hospital Stay: Payer: No Typology Code available for payment source

## 2023-07-23 ENCOUNTER — Ambulatory Visit (HOSPITAL_BASED_OUTPATIENT_CLINIC_OR_DEPARTMENT_OTHER)
Admission: RE | Admit: 2023-07-23 | Discharge: 2023-07-23 | Disposition: A | Discharge status: Home / Self Care | Payer: No Typology Code available for payment source | Source: Ambulatory Visit | Attending: Nurse Practitioner | Admitting: Nurse Practitioner

## 2023-07-23 VITALS — BP 107/79 | HR 95 | Temp 98.2°F | Resp 18

## 2023-07-23 DIAGNOSIS — C3490 Malignant neoplasm of unspecified part of unspecified bronchus or lung: Secondary | ICD-10-CM | POA: Insufficient documentation

## 2023-07-23 DIAGNOSIS — C342 Malignant neoplasm of middle lobe, bronchus or lung: Secondary | ICD-10-CM | POA: Insufficient documentation

## 2023-07-23 DIAGNOSIS — I700 Atherosclerosis of aorta: Secondary | ICD-10-CM | POA: Diagnosis not present

## 2023-07-23 DIAGNOSIS — C50911 Malignant neoplasm of unspecified site of right female breast: Secondary | ICD-10-CM | POA: Diagnosis not present

## 2023-07-23 DIAGNOSIS — Z95828 Presence of other vascular implants and grafts: Secondary | ICD-10-CM

## 2023-07-23 DIAGNOSIS — Z5112 Encounter for antineoplastic immunotherapy: Secondary | ICD-10-CM | POA: Diagnosis not present

## 2023-07-23 DIAGNOSIS — J439 Emphysema, unspecified: Secondary | ICD-10-CM | POA: Diagnosis not present

## 2023-07-23 MED ORDER — IOHEXOL 300 MG/ML  SOLN
100.0000 mL | Freq: Once | INTRAMUSCULAR | Status: AC | PRN
  Administered 2023-07-23: 100 mL via INTRAVENOUS

## 2023-07-23 MED ORDER — SODIUM CHLORIDE 0.9% FLUSH
10.0000 mL | Freq: Once | INTRAVENOUS | Status: AC
  Administered 2023-07-23: 10 mL via INTRAVENOUS

## 2023-07-23 MED ORDER — HEPARIN SOD (PORK) LOCK FLUSH 100 UNIT/ML IV SOLN
500.0000 [IU] | Freq: Once | INTRAVENOUS | Status: AC
  Administered 2023-07-23: 500 [IU] via INTRAVENOUS

## 2023-07-23 NOTE — Patient Instructions (Signed)
PICC Home Care Guide A peripherally inserted central catheter (PICC) is a form of IV access that allows medicines and IV fluids to be quickly put into the blood and spread throughout the body. The PICC is a long, thin, flexible tube (catheter) that is put into a vein in a person's arm or leg. The catheter ends in a large vein just outside the heart called the superior vena cava (SVC). After the PICC is put in, a chest X-ray may be done to make sure that it is in the right place. A PICC may be placed for different reasons, such as: To give medicines and liquid nutrition. To give IV fluids and blood products. To take blood samples often. If there is trouble placing a peripheral intravenous (PIV) catheter. If cared for properly, a PICC can remain in place for many months. Having a PICC can allow you to go home from the hospital sooner and continue treatment at home. Medicines and PICC care can be managed at home by a family member, caregiver, or home health care team. What are the risks? Generally, having a PICC is safe. However, problems may occur, including: A blood clot (thrombus) forming in or at the end of the PICC. A blood clot forming in a vein (deep vein thrombosis) or traveling to the lung (pulmonary embolism). Inflammation of the vein (phlebitis) in which the PICC is placed. Infection at the insertion site or in the blood. Blood infections from central lines, like PICCs, can be serious and often require a hospital stay. PICC malposition, or PICC movement or poor placement. A break or cut in the PICC. Do not use scissors near the PICC. Nerve or tendon irritation or injury during PICC insertion. How to care for your PICC Please follow the specific guidelines provided by your health care provider. Preventing infection You and any caregivers should wash your hands often with soap and water for at least 20 seconds. Wash hands: Before touching the PICC or the infusion device. Before changing a  bandage (dressing). Do not change the dressing unless you have been taught to do so and have shown you are able to change it safely. Flush the PICC as told. Tell your health care provider right away if the PICC is hard to flush or does not flush. Do not use force to flush the PICC. Use clean and germ-free (sterile) supplies only. Keep the supplies in a dry place. Do not reuse needles, syringes, or any other supplies. Reusing supplies can lead to infection. Keep the PICC dressing dry and secure it with tape if the edges stop sticking to your skin. Check your PICC insertion site every day for signs of infection. Check for: Redness, swelling, or pain. Fluid or blood. Warmth. Pus or a bad smell. Preventing other problems Do not use a syringe that is less than 10 mL to flush the PICC. Do not have your blood pressure checked on the arm in which the PICC is placed. Do not ever pull or tug on the PICC. Keep it secured to your arm with tape or a stretch wrap when not in use. Do not take the PICC out yourself. Only a trained health care provider should remove the PICC. Keep pets and children away from your PICC. How to care for your PICC dressing Keep your PICC dressing clean and dry to prevent infection. Do not take baths, swim, or use a hot tub until your health care provider approves. Ask your health care provider if you can take  showers. You may only be allowed to take sponge baths. When you are allowed to shower: Ask your health care provider to teach you how to wrap the PICC. Cover the PICC with clear plastic wrap and tape to keep it dry while showering. Follow instructions from your health care provider about how to take care of your insertion site and dressing. Make sure you: Wash your hands with soap and water for at least 20 seconds before and after you change your dressing. If soap and water are not available, use hand sanitizer. Change your dressing only if taught to do so by your health care  provider. Your PICC dressing needs to be changed if it becomes loose or wet. Leave stitches (sutures), skin glue, or adhesive strips in place. These skin closures may need to stay in place for 2 weeks or longer. If adhesive strip edges start to loosen and curl up, you may trim the loose edges. Do not remove adhesive strips completely unless your health care provider tells you to do that. Follow these instructions at home: Disposal of supplies Throw away any syringes in a disposal container that is meant for sharp items (sharps container). You can buy a sharps container from a pharmacy, or you can make one by using an empty, hard plastic bottle with a lid. Place any used dressings or infusion bags into a plastic bag. Throw that bag in the trash. General instructions  Always carry your PICC identification card or wear a medical alert bracelet. Keep the tube clamped at all times, unless it is being used. Always carry a smooth-edge clamp with you to clamp the PICC if it breaks. Do not use scissors or sharp objects near the tube. You may bend your arm and move it freely. If your PICC is near or at the bend of your elbow, avoid activity with repeated motion at the elbow. Avoid lifting heavy objects as told by your health care provider. Keep all follow-up visits. This is important. You will need to have your PICC dressing changed at least once a week. Contact a health care provider if: You have pain in your arm, ear, face, or teeth. You have a fever or chills. You have redness, swelling, or pain around the insertion site. You have fluid or blood coming from the insertion site. Your insertion site feels warm to the touch. You have pus or a bad smell coming from the insertion site. Your skin feels hard and raised around the insertion site. Your PICC dressing has gotten wet or is coming off and you have not been taught how to change it. Get help right away if: You have problems with your PICC, such as  your PICC: Was tugged or pulled and has partially come out. Do not  push the PICC back in. Cannot be flushed, is hard to flush, or leaks around the insertion site when it is flushed. Makes a flushing sound when it is flushed. Appears to have a hole or tear. Is accidentally pulled all the way out. If this happens, cover the insertion site with a gauze dressing. Do not throw the PICC away. Your health care provider will need to check it to be sure the entire catheter came out. You feel your heart racing or skipping beats, or you have chest pain. You have shortness of breath or trouble breathing. You have swelling, redness, warmth, or pain in the arm in which the PICC is placed. You have a red streak going up your arm that  starts under the PICC dressing. These symptoms may be an emergency. Get help right away. Call 911. Do not wait to see if the symptoms will go away. Do not drive yourself to the hospital. Summary A peripherally inserted central catheter (PICC) is a long, thin, flexible tube (catheter) that is put into a vein in the arm or leg. If cared for properly, a PICC can remain in place for many months. Having a PICC can allow you to go home from the hospital sooner and continue treatment at home. The PICC is inserted using a germ-free (sterile) technique by a specially trained health care provider. Only a trained health care provider should remove it. Do not have your blood pressure checked on the arm in which your PICC is placed. Always keep your PICC identification card with you. This information is not intended to replace advice given to you by your health care provider. Make sure you discuss any questions you have with your health care provider. Document Revised: 04/27/2021 Document Reviewed: 04/27/2021 Elsevier Patient Education  2024 [employer].

## 2023-07-24 DIAGNOSIS — I10 Essential (primary) hypertension: Secondary | ICD-10-CM | POA: Diagnosis not present

## 2023-07-24 DIAGNOSIS — Z86718 Personal history of other venous thrombosis and embolism: Secondary | ICD-10-CM | POA: Diagnosis not present

## 2023-07-24 DIAGNOSIS — C3491 Malignant neoplasm of unspecified part of right bronchus or lung: Secondary | ICD-10-CM | POA: Diagnosis not present

## 2023-07-24 DIAGNOSIS — F411 Generalized anxiety disorder: Secondary | ICD-10-CM | POA: Diagnosis not present

## 2023-07-27 ENCOUNTER — Encounter: Payer: Self-pay | Admitting: *Deleted

## 2023-07-27 ENCOUNTER — Inpatient Hospital Stay: Payer: No Typology Code available for payment source

## 2023-07-27 ENCOUNTER — Inpatient Hospital Stay: Payer: No Typology Code available for payment source | Admitting: Oncology

## 2023-07-27 ENCOUNTER — Inpatient Hospital Stay: Payer: No Typology Code available for payment source | Attending: Oncology

## 2023-07-27 VITALS — BP 115/81 | HR 90 | Temp 98.1°F | Resp 18 | Ht 65.0 in | Wt 201.6 lb

## 2023-07-27 VITALS — BP 99/56 | HR 96 | Temp 98.2°F | Resp 18

## 2023-07-27 DIAGNOSIS — Z452 Encounter for adjustment and management of vascular access device: Secondary | ICD-10-CM | POA: Insufficient documentation

## 2023-07-27 DIAGNOSIS — C3490 Malignant neoplasm of unspecified part of unspecified bronchus or lung: Secondary | ICD-10-CM

## 2023-07-27 DIAGNOSIS — C342 Malignant neoplasm of middle lobe, bronchus or lung: Secondary | ICD-10-CM

## 2023-07-27 DIAGNOSIS — Z79620 Long term (current) use of immunosuppressive biologic: Secondary | ICD-10-CM | POA: Insufficient documentation

## 2023-07-27 DIAGNOSIS — Z95828 Presence of other vascular implants and grafts: Secondary | ICD-10-CM

## 2023-07-27 DIAGNOSIS — Z5112 Encounter for antineoplastic immunotherapy: Secondary | ICD-10-CM | POA: Insufficient documentation

## 2023-07-27 LAB — CMP (CANCER CENTER ONLY)
ALT: 16 U/L (ref 0–44)
AST: 20 U/L (ref 15–41)
Albumin: 3.4 g/dL — ABNORMAL LOW (ref 3.5–5.0)
Alkaline Phosphatase: 94 U/L (ref 38–126)
Anion gap: 5 (ref 5–15)
BUN: 23 mg/dL (ref 8–23)
CO2: 28 mmol/L (ref 22–32)
Calcium: 8.9 mg/dL (ref 8.9–10.3)
Chloride: 108 mmol/L (ref 98–111)
Creatinine: 1.31 mg/dL — ABNORMAL HIGH (ref 0.44–1.00)
GFR, Estimated: 42 mL/min — ABNORMAL LOW (ref >60)
Glucose, Bld: 94 mg/dL (ref 70–99)
Potassium: 4 mmol/L (ref 3.5–5.1)
Sodium: 141 mmol/L (ref 135–145)
Total Bilirubin: 0.3 mg/dL (ref 0.3–1.2)
Total Protein: 6.4 g/dL — ABNORMAL LOW (ref 6.5–8.1)

## 2023-07-27 LAB — CBC WITH DIFFERENTIAL (CANCER CENTER ONLY)
Abs Immature Granulocytes: 0.01 10*3/uL (ref 0.00–0.07)
Basophils Absolute: 0 10*3/uL (ref 0.0–0.1)
Basophils Relative: 1 %
Eosinophils Absolute: 0 10*3/uL (ref 0.0–0.5)
Eosinophils Relative: 0 %
HCT: 36.4 % (ref 36.0–46.0)
Hemoglobin: 11.6 g/dL — ABNORMAL LOW (ref 12.0–15.0)
Immature Granulocytes: 0 %
Lymphocytes Relative: 37 %
Lymphs Abs: 1.5 10*3/uL (ref 0.7–4.0)
MCH: 31 pg (ref 26.0–34.0)
MCHC: 31.9 g/dL (ref 30.0–36.0)
MCV: 97.3 fL (ref 80.0–100.0)
Monocytes Absolute: 0.4 10*3/uL (ref 0.1–1.0)
Monocytes Relative: 9 %
Neutro Abs: 2.2 10*3/uL (ref 1.7–7.7)
Neutrophils Relative %: 53 %
Platelet Count: 169 10*3/uL (ref 150–400)
RBC: 3.74 MIL/uL — ABNORMAL LOW (ref 3.87–5.11)
RDW: 14.2 % (ref 11.5–15.5)
WBC Count: 4.1 10*3/uL (ref 4.0–10.5)
nRBC: 0 % (ref 0.0–0.2)

## 2023-07-27 LAB — TSH: TSH: 3.435 u[IU]/mL (ref 0.350–4.500)

## 2023-07-27 MED ORDER — SODIUM CHLORIDE 0.9 % IV SOLN: Freq: Once | INTRAVENOUS | Status: AC

## 2023-07-27 MED ORDER — SODIUM CHLORIDE 0.9 % IV SOLN
1200.0000 mg | Freq: Once | INTRAVENOUS | Status: AC
  Administered 2023-07-27: 1200 mg via INTRAVENOUS
  Filled 2023-07-27: qty 20

## 2023-07-27 MED ORDER — SODIUM CHLORIDE 0.9% FLUSH
10.0000 mL | INTRAVENOUS | Status: DC | PRN
  Administered 2023-07-27: 10 mL

## 2023-07-27 MED ORDER — HEPARIN SOD (PORK) LOCK FLUSH 100 UNIT/ML IV SOLN
250.0000 [IU] | Freq: Once | INTRAVENOUS | Status: AC | PRN
  Administered 2023-07-27: 250 [IU]

## 2023-07-27 MED ORDER — SODIUM CHLORIDE 0.9% FLUSH
10.0000 mL | INTRAVENOUS | Status: DC | PRN
  Administered 2023-07-27: 10 mL via INTRAVENOUS

## 2023-07-27 MED ORDER — HEPARIN SOD (PORK) LOCK FLUSH 100 UNIT/ML IV SOLN: 500.0000 [IU] | Freq: Once | INTRAVENOUS | Status: DC

## 2023-07-27 NOTE — Progress Notes (Signed)
Patient seen by Dr. Thornton Papas today  Vitals are within treatment parameters:Yes   Labs are within treatment parameters: Yes   Treatment plan has been signed: Yes   Per physician team, Patient is ready for treatment and there are NO modifications to the treatment plan.
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
## 2023-07-27 NOTE — Patient Instructions (Signed)
[facility] [address]  Discharge Instructions: Thank you for choosing [facility] to provide your oncology and hematology care.   If you have a lab appointment with the Cancer Center, please go directly to the Cancer Center and check in at the registration area.   Wear comfortable clothing and clothing appropriate for easy access to any Portacath or PICC line.   We strive to give you quality time with your provider. You may need to reschedule your appointment if you arrive late (15 or more minutes).  Arriving late affects you and other patients whose appointments are after yours.  Also, if you miss three or more appointments without notifying the office, you may be dismissed from the clinic at the provider's discretion.      For prescription refill requests, have your pharmacy contact our office and allow 72 hours for refills to be completed.    Today you received the following chemotherapy and/or immunotherapy agents Tecentriq      To help prevent nausea and vomiting after your treatment, we encourage you to take your nausea medication as directed.  BELOW ARE SYMPTOMS THAT SHOULD BE REPORTED IMMEDIATELY: *FEVER GREATER THAN 100.4 F (38 C) OR HIGHER *CHILLS OR SWEATING *NAUSEA AND VOMITING THAT IS NOT CONTROLLED WITH YOUR NAUSEA MEDICATION *UNUSUAL SHORTNESS OF BREATH *UNUSUAL BRUISING OR BLEEDING *URINARY PROBLEMS (pain or burning when urinating, or frequent urination) *BOWEL PROBLEMS (unusual diarrhea, constipation, pain near the anus) TENDERNESS IN MOUTH AND THROAT WITH OR WITHOUT PRESENCE OF ULCERS (sore throat, sores in mouth, or a toothache) UNUSUAL RASH, SWELLING OR PAIN  UNUSUAL VAGINAL DISCHARGE OR ITCHING   Items with * indicate a potential emergency and should be followed up as soon as possible or go to the Emergency Department if any problems should occur.  Please show the CHEMOTHERAPY ALERT CARD or IMMUNOTHERAPY ALERT CARD at check-in  to the Emergency Department and triage nurse.  Should you have questions after your visit or need to cancel or reschedule your appointment, please contact [facility]  Dept: [phone]  and follow the prompts.  Office hours are 8:00 a.m. to 4:30 p.m. Monday - Friday. Please note that voicemails left after 4:00 p.m. may not be returned until the following business day.  We are closed weekends and major holidays. You have access to a nurse at all times for urgent questions. Please call the main number to the clinic Dept: [phone] and follow the prompts.   For any non-urgent questions, you may also contact your provider using MyChart. We now offer e-Visits for anyone 72 and older to request care online for non-urgent symptoms. For details visit [email or web address].   Also download the MyChart app! Go to the app store, search "MyChart", open the app, select [facility], and log in with your MyChart username and password.

## 2023-07-28 LAB — T4: T4, Total: 5.9 ug/dL (ref 4.5–12.0)

## 2023-07-29 DIAGNOSIS — C7951 Secondary malignant neoplasm of bone: Secondary | ICD-10-CM | POA: Diagnosis not present

## 2023-07-29 DIAGNOSIS — C8590 Non-Hodgkin lymphoma, unspecified, unspecified site: Secondary | ICD-10-CM | POA: Diagnosis not present

## 2023-07-29 DIAGNOSIS — C342 Malignant neoplasm of middle lobe, bronchus or lung: Secondary | ICD-10-CM | POA: Diagnosis not present

## 2023-07-29 DIAGNOSIS — I629 Nontraumatic intracranial hemorrhage, unspecified: Secondary | ICD-10-CM | POA: Diagnosis not present

## 2023-08-03 ENCOUNTER — Inpatient Hospital Stay: Payer: No Typology Code available for payment source

## 2023-08-03 VITALS — BP 114/80 | HR 91 | Temp 98.2°F | Resp 18

## 2023-08-03 DIAGNOSIS — Z5112 Encounter for antineoplastic immunotherapy: Secondary | ICD-10-CM | POA: Diagnosis not present

## 2023-08-03 DIAGNOSIS — Z452 Encounter for adjustment and management of vascular access device: Secondary | ICD-10-CM

## 2023-08-03 MED ORDER — HEPARIN SOD (PORK) LOCK FLUSH 100 UNIT/ML IV SOLN
250.0000 [IU] | Freq: Once | INTRAVENOUS | Status: AC
  Administered 2023-08-03: 250 [IU] via INTRAVENOUS

## 2023-08-03 MED ORDER — SODIUM CHLORIDE 0.9% FLUSH
10.0000 mL | Freq: Once | INTRAVENOUS | Status: AC
  Administered 2023-08-03: 10 mL via INTRAVENOUS

## 2023-08-10 ENCOUNTER — Inpatient Hospital Stay: Payer: No Typology Code available for payment source

## 2023-08-10 VITALS — BP 127/78 | HR 98 | Temp 97.9°F | Resp 18

## 2023-08-10 DIAGNOSIS — Z452 Encounter for adjustment and management of vascular access device: Secondary | ICD-10-CM

## 2023-08-10 DIAGNOSIS — Z5112 Encounter for antineoplastic immunotherapy: Secondary | ICD-10-CM | POA: Diagnosis not present

## 2023-08-10 MED ORDER — SODIUM CHLORIDE 0.9% FLUSH
10.0000 mL | Freq: Once | INTRAVENOUS | Status: AC
  Administered 2023-08-10: 10 mL via INTRAVENOUS

## 2023-08-10 MED ORDER — HEPARIN SOD (PORK) LOCK FLUSH 100 UNIT/ML IV SOLN
500.0000 [IU] | Freq: Once | INTRAVENOUS | Status: AC
  Administered 2023-08-10: 500 [IU] via INTRAVENOUS

## 2023-08-10 NOTE — Patient Instructions (Signed)
PICC Home Care Guide A peripherally inserted central catheter (PICC) is a form of IV access that allows medicines and IV fluids to be quickly put into the blood and spread throughout the body. The PICC is a long, thin, flexible tube (catheter) that is put into a vein in a person's arm or leg. The catheter ends in a large vein just outside the heart called the superior vena cava (SVC). After the PICC is put in, a chest X-ray may be done to make sure that it is in the right place. A PICC may be placed for different reasons, such as: To give medicines and liquid nutrition. To give IV fluids and blood products. To take blood samples often. If there is trouble placing a peripheral intravenous (PIV) catheter. If cared for properly, a PICC can remain in place for many months. Having a PICC can allow you to go home from the hospital sooner and continue treatment at home. Medicines and PICC care can be managed at home by a family member, caregiver, or home health care team. What are the risks? Generally, having a PICC is safe. However, problems may occur, including: A blood clot (thrombus) forming in or at the end of the PICC. A blood clot forming in a vein (deep vein thrombosis) or traveling to the lung (pulmonary embolism). Inflammation of the vein (phlebitis) in which the PICC is placed. Infection at the insertion site or in the blood. Blood infections from central lines, like PICCs, can be serious and often require a hospital stay. PICC malposition, or PICC movement or poor placement. A break or cut in the PICC. Do not use scissors near the PICC. Nerve or tendon irritation or injury during PICC insertion. How to care for your PICC Please follow the specific guidelines provided by your health care provider. Preventing infection You and any caregivers should wash your hands often with soap and water for at least 20 seconds. Wash hands: Before touching the PICC or the infusion device. Before changing a  bandage (dressing). Do not change the dressing unless you have been taught to do so and have shown you are able to change it safely. Flush the PICC as told. Tell your health care provider right away if the PICC is hard to flush or does not flush. Do not use force to flush the PICC. Use clean and germ-free (sterile) supplies only. Keep the supplies in a dry place. Do not reuse needles, syringes, or any other supplies. Reusing supplies can lead to infection. Keep the PICC dressing dry and secure it with tape if the edges stop sticking to your skin. Check your PICC insertion site every day for signs of infection. Check for: Redness, swelling, or pain. Fluid or blood. Warmth. Pus or a bad smell. Preventing other problems Do not use a syringe that is less than 10 mL to flush the PICC. Do not have your blood pressure checked on the arm in which the PICC is placed. Do not ever pull or tug on the PICC. Keep it secured to your arm with tape or a stretch wrap when not in use. Do not take the PICC out yourself. Only a trained health care provider should remove the PICC. Keep pets and children away from your PICC. How to care for your PICC dressing Keep your PICC dressing clean and dry to prevent infection. Do not take baths, swim, or use a hot tub until your health care provider approves. Ask your health care provider if you can take  showers. You may only be allowed to take sponge baths. When you are allowed to shower: Ask your health care provider to teach you how to wrap the PICC. Cover the PICC with clear plastic wrap and tape to keep it dry while showering. Follow instructions from your health care provider about how to take care of your insertion site and dressing. Make sure you: Wash your hands with soap and water for at least 20 seconds before and after you change your dressing. If soap and water are not available, use hand sanitizer. Change your dressing only if taught to do so by your health care  provider. Your PICC dressing needs to be changed if it becomes loose or wet. Leave stitches (sutures), skin glue, or adhesive strips in place. These skin closures may need to stay in place for 2 weeks or longer. If adhesive strip edges start to loosen and curl up, you may trim the loose edges. Do not remove adhesive strips completely unless your health care provider tells you to do that. Follow these instructions at home: Disposal of supplies Throw away any syringes in a disposal container that is meant for sharp items (sharps container). You can buy a sharps container from a pharmacy, or you can make one by using an empty, hard plastic bottle with a lid. Place any used dressings or infusion bags into a plastic bag. Throw that bag in the trash. General instructions  Always carry your PICC identification card or wear a medical alert bracelet. Keep the tube clamped at all times, unless it is being used. Always carry a smooth-edge clamp with you to clamp the PICC if it breaks. Do not use scissors or sharp objects near the tube. You may bend your arm and move it freely. If your PICC is near or at the bend of your elbow, avoid activity with repeated motion at the elbow. Avoid lifting heavy objects as told by your health care provider. Keep all follow-up visits. This is important. You will need to have your PICC dressing changed at least once a week. Contact a health care provider if: You have pain in your arm, ear, face, or teeth. You have a fever or chills. You have redness, swelling, or pain around the insertion site. You have fluid or blood coming from the insertion site. Your insertion site feels warm to the touch. You have pus or a bad smell coming from the insertion site. Your skin feels hard and raised around the insertion site. Your PICC dressing has gotten wet or is coming off and you have not been taught how to change it. Get help right away if: You have problems with your PICC, such as  your PICC: Was tugged or pulled and has partially come out. Do not  push the PICC back in. Cannot be flushed, is hard to flush, or leaks around the insertion site when it is flushed. Makes a flushing sound when it is flushed. Appears to have a hole or tear. Is accidentally pulled all the way out. If this happens, cover the insertion site with a gauze dressing. Do not throw the PICC away. Your health care provider will need to check it to be sure the entire catheter came out. You feel your heart racing or skipping beats, or you have chest pain. You have shortness of breath or trouble breathing. You have swelling, redness, warmth, or pain in the arm in which the PICC is placed. You have a red streak going up your arm that  starts under the PICC dressing. These symptoms may be an emergency. Get help right away. Call 911. Do not wait to see if the symptoms will go away. Do not drive yourself to the hospital. Summary A peripherally inserted central catheter (PICC) is a long, thin, flexible tube (catheter) that is put into a vein in the arm or leg. If cared for properly, a PICC can remain in place for many months. Having a PICC can allow you to go home from the hospital sooner and continue treatment at home. The PICC is inserted using a germ-free (sterile) technique by a specially trained health care provider. Only a trained health care provider should remove it. Do not have your blood pressure checked on the arm in which your PICC is placed. Always keep your PICC identification card with you. This information is not intended to replace advice given to you by your health care provider. Make sure you discuss any questions you have with your health care provider. Document Revised: 04/27/2021 Document Reviewed: 04/27/2021 Elsevier Patient Education  2024 [employer].

## 2023-08-17 ENCOUNTER — Encounter: Payer: Self-pay | Admitting: Nurse Practitioner

## 2023-08-17 ENCOUNTER — Inpatient Hospital Stay: Payer: No Typology Code available for payment source

## 2023-08-17 ENCOUNTER — Inpatient Hospital Stay: Payer: No Typology Code available for payment source | Admitting: Nurse Practitioner

## 2023-08-17 VITALS — BP 106/80 | HR 91 | Temp 98.2°F | Resp 18 | Wt 201.3 lb

## 2023-08-17 DIAGNOSIS — C342 Malignant neoplasm of middle lobe, bronchus or lung: Secondary | ICD-10-CM

## 2023-08-17 DIAGNOSIS — C3490 Malignant neoplasm of unspecified part of unspecified bronchus or lung: Secondary | ICD-10-CM

## 2023-08-17 DIAGNOSIS — Z5112 Encounter for antineoplastic immunotherapy: Secondary | ICD-10-CM | POA: Diagnosis not present

## 2023-08-17 LAB — CMP (CANCER CENTER ONLY)
ALT: 10 U/L (ref 0–44)
AST: 16 U/L (ref 15–41)
Albumin: 3.4 g/dL — ABNORMAL LOW (ref 3.5–5.0)
Alkaline Phosphatase: 85 U/L (ref 38–126)
Anion gap: 6 (ref 5–15)
BUN: 22 mg/dL (ref 8–23)
CO2: 28 mmol/L (ref 22–32)
Calcium: 8.7 mg/dL — ABNORMAL LOW (ref 8.9–10.3)
Chloride: 106 mmol/L (ref 98–111)
Creatinine: 1.27 mg/dL — ABNORMAL HIGH (ref 0.44–1.00)
GFR, Estimated: 43 mL/min — ABNORMAL LOW (ref >60)
Glucose, Bld: 112 mg/dL — ABNORMAL HIGH (ref 70–99)
Potassium: 3.9 mmol/L (ref 3.5–5.1)
Sodium: 140 mmol/L (ref 135–145)
Total Bilirubin: 0.4 mg/dL (ref 0.3–1.2)
Total Protein: 6.7 g/dL (ref 6.5–8.1)

## 2023-08-17 LAB — CBC WITH DIFFERENTIAL (CANCER CENTER ONLY)
Abs Immature Granulocytes: 0.01 10*3/uL (ref 0.00–0.07)
Basophils Absolute: 0 10*3/uL (ref 0.0–0.1)
Basophils Relative: 1 %
Eosinophils Absolute: 0 10*3/uL (ref 0.0–0.5)
Eosinophils Relative: 0 %
HCT: 36.9 % (ref 36.0–46.0)
Hemoglobin: 11.5 g/dL — ABNORMAL LOW (ref 12.0–15.0)
Immature Granulocytes: 0 %
Lymphocytes Relative: 37 %
Lymphs Abs: 1.5 10*3/uL (ref 0.7–4.0)
MCH: 29.9 pg (ref 26.0–34.0)
MCHC: 31.2 g/dL (ref 30.0–36.0)
MCV: 96.1 fL (ref 80.0–100.0)
Monocytes Absolute: 0.4 10*3/uL (ref 0.1–1.0)
Monocytes Relative: 9 %
Neutro Abs: 2.2 10*3/uL (ref 1.7–7.7)
Neutrophils Relative %: 53 %
Platelet Count: 185 10*3/uL (ref 150–400)
RBC: 3.84 MIL/uL — ABNORMAL LOW (ref 3.87–5.11)
RDW: 14.1 % (ref 11.5–15.5)
WBC Count: 4.2 10*3/uL (ref 4.0–10.5)
nRBC: 0 % (ref 0.0–0.2)

## 2023-08-17 MED ORDER — SODIUM CHLORIDE 0.9 % IV SOLN
1200.0000 mg | Freq: Once | INTRAVENOUS | Status: AC
  Administered 2023-08-17: 1200 mg via INTRAVENOUS
  Filled 2023-08-17: qty 20

## 2023-08-17 MED ORDER — HEPARIN SOD (PORK) LOCK FLUSH 100 UNIT/ML IV SOLN
250.0000 [IU] | Freq: Once | INTRAVENOUS | Status: AC | PRN
  Administered 2023-08-17: 250 [IU]

## 2023-08-17 MED ORDER — SODIUM CHLORIDE 0.9% FLUSH
10.0000 mL | INTRAVENOUS | Status: DC | PRN
Start: 2023-08-17 — End: 2023-08-17
  Administered 2023-08-17: 10 mL

## 2023-08-17 MED ORDER — SODIUM CHLORIDE 0.9 % IV SOLN: Freq: Once | INTRAVENOUS | Status: AC

## 2023-08-17 NOTE — Patient Instructions (Signed)
[facility] [address]  Discharge Instructions: Thank you for choosing [facility] to provide your oncology and hematology care.   If you have a lab appointment with the Cancer Center, please go directly to the Cancer Center and check in at the registration area.   Wear comfortable clothing and clothing appropriate for easy access to any Portacath or PICC line.   We strive to give you quality time with your provider. You may need to reschedule your appointment if you arrive late (15 or more minutes).  Arriving late affects you and other patients whose appointments are after yours.  Also, if you miss three or more appointments without notifying the office, you may be dismissed from the clinic at the provider's discretion.      For prescription refill requests, have your pharmacy contact our office and allow 72 hours for refills to be completed.    Today you received the following chemotherapy and/or immunotherapy agents Tecentriq      To help prevent nausea and vomiting after your treatment, we encourage you to take your nausea medication as directed.  BELOW ARE SYMPTOMS THAT SHOULD BE REPORTED IMMEDIATELY: *FEVER GREATER THAN 100.4 F (38 C) OR HIGHER *CHILLS OR SWEATING *NAUSEA AND VOMITING THAT IS NOT CONTROLLED WITH YOUR NAUSEA MEDICATION *UNUSUAL SHORTNESS OF BREATH *UNUSUAL BRUISING OR BLEEDING *URINARY PROBLEMS (pain or burning when urinating, or frequent urination) *BOWEL PROBLEMS (unusual diarrhea, constipation, pain near the anus) TENDERNESS IN MOUTH AND THROAT WITH OR WITHOUT PRESENCE OF ULCERS (sore throat, sores in mouth, or a toothache) UNUSUAL RASH, SWELLING OR PAIN  UNUSUAL VAGINAL DISCHARGE OR ITCHING   Items with * indicate a potential emergency and should be followed up as soon as possible or go to the Emergency Department if any problems should occur.  Please show the CHEMOTHERAPY ALERT CARD or IMMUNOTHERAPY ALERT CARD at check-in  to the Emergency Department and triage nurse.  Should you have questions after your visit or need to cancel or reschedule your appointment, please contact [facility]  Dept: [phone]  and follow the prompts.  Office hours are 8:00 a.m. to 4:30 p.m. Monday - Friday. Please note that voicemails left after 4:00 p.m. may not be returned until the following business day.  We are closed weekends and major holidays. You have access to a nurse at all times for urgent questions. Please call the main number to the clinic Dept: [phone] and follow the prompts.   For any non-urgent questions, you may also contact your provider using MyChart. We now offer e-Visits for anyone 72 and older to request care online for non-urgent symptoms. For details visit [email or web address].   Also download the MyChart app! Go to the app store, search "MyChart", open the app, select [facility], and log in with your MyChart username and password.
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
## 2023-08-17 NOTE — Patient Instructions (Signed)
PICC Home Care Guide A peripherally inserted central catheter (PICC) is a form of IV access that allows medicines and IV fluids to be quickly put into the blood and spread throughout the body. The PICC is a long, thin, flexible tube (catheter) that is put into a vein in a person's arm or leg. The catheter ends in a large vein just outside the heart called the superior vena cava (SVC). After the PICC is put in, a chest X-ray may be done to make sure that it is in the right place. A PICC may be placed for different reasons, such as: To give medicines and liquid nutrition. To give IV fluids and blood products. To take blood samples often. If there is trouble placing a peripheral intravenous (PIV) catheter. If cared for properly, a PICC can remain in place for many months. Having a PICC can allow you to go home from the hospital sooner and continue treatment at home. Medicines and PICC care can be managed at home by a family member, caregiver, or home health care team. What are the risks? Generally, having a PICC is safe. However, problems may occur, including: A blood clot (thrombus) forming in or at the end of the PICC. A blood clot forming in a vein (deep vein thrombosis) or traveling to the lung (pulmonary embolism). Inflammation of the vein (phlebitis) in which the PICC is placed. Infection at the insertion site or in the blood. Blood infections from central lines, like PICCs, can be serious and often require a hospital stay. PICC malposition, or PICC movement or poor placement. A break or cut in the PICC. Do not use scissors near the PICC. Nerve or tendon irritation or injury during PICC insertion. How to care for your PICC Please follow the specific guidelines provided by your health care provider. Preventing infection You and any caregivers should wash your hands often with soap and water for at least 20 seconds. Wash hands: Before touching the PICC or the infusion device. Before changing a  bandage (dressing). Do not change the dressing unless you have been taught to do so and have shown you are able to change it safely. Flush the PICC as told. Tell your health care provider right away if the PICC is hard to flush or does not flush. Do not use force to flush the PICC. Use clean and germ-free (sterile) supplies only. Keep the supplies in a dry place. Do not reuse needles, syringes, or any other supplies. Reusing supplies can lead to infection. Keep the PICC dressing dry and secure it with tape if the edges stop sticking to your skin. Check your PICC insertion site every day for signs of infection. Check for: Redness, swelling, or pain. Fluid or blood. Warmth. Pus or a bad smell. Preventing other problems Do not use a syringe that is less than 10 mL to flush the PICC. Do not have your blood pressure checked on the arm in which the PICC is placed. Do not ever pull or tug on the PICC. Keep it secured to your arm with tape or a stretch wrap when not in use. Do not take the PICC out yourself. Only a trained health care provider should remove the PICC. Keep pets and children away from your PICC. How to care for your PICC dressing Keep your PICC dressing clean and dry to prevent infection. Do not take baths, swim, or use a hot tub until your health care provider approves. Ask your health care provider if you can take  showers. You may only be allowed to take sponge baths. When you are allowed to shower: Ask your health care provider to teach you how to wrap the PICC. Cover the PICC with clear plastic wrap and tape to keep it dry while showering. Follow instructions from your health care provider about how to take care of your insertion site and dressing. Make sure you: Wash your hands with soap and water for at least 20 seconds before and after you change your dressing. If soap and water are not available, use hand sanitizer. Change your dressing only if taught to do so by your health care  provider. Your PICC dressing needs to be changed if it becomes loose or wet. Leave stitches (sutures), skin glue, or adhesive strips in place. These skin closures may need to stay in place for 2 weeks or longer. If adhesive strip edges start to loosen and curl up, you may trim the loose edges. Do not remove adhesive strips completely unless your health care provider tells you to do that. Follow these instructions at home: Disposal of supplies Throw away any syringes in a disposal container that is meant for sharp items (sharps container). You can buy a sharps container from a pharmacy, or you can make one by using an empty, hard plastic bottle with a lid. Place any used dressings or infusion bags into a plastic bag. Throw that bag in the trash. General instructions  Always carry your PICC identification card or wear a medical alert bracelet. Keep the tube clamped at all times, unless it is being used. Always carry a smooth-edge clamp with you to clamp the PICC if it breaks. Do not use scissors or sharp objects near the tube. You may bend your arm and move it freely. If your PICC is near or at the bend of your elbow, avoid activity with repeated motion at the elbow. Avoid lifting heavy objects as told by your health care provider. Keep all follow-up visits. This is important. You will need to have your PICC dressing changed at least once a week. Contact a health care provider if: You have pain in your arm, ear, face, or teeth. You have a fever or chills. You have redness, swelling, or pain around the insertion site. You have fluid or blood coming from the insertion site. Your insertion site feels warm to the touch. You have pus or a bad smell coming from the insertion site. Your skin feels hard and raised around the insertion site. Your PICC dressing has gotten wet or is coming off and you have not been taught how to change it. Get help right away if: You have problems with your PICC, such as  your PICC: Was tugged or pulled and has partially come out. Do not  push the PICC back in. Cannot be flushed, is hard to flush, or leaks around the insertion site when it is flushed. Makes a flushing sound when it is flushed. Appears to have a hole or tear. Is accidentally pulled all the way out. If this happens, cover the insertion site with a gauze dressing. Do not throw the PICC away. Your health care provider will need to check it to be sure the entire catheter came out. You feel your heart racing or skipping beats, or you have chest pain. You have shortness of breath or trouble breathing. You have swelling, redness, warmth, or pain in the arm in which the PICC is placed. You have a red streak going up your arm that  starts under the PICC dressing. These symptoms may be an emergency. Get help right away. Call 911. Do not wait to see if the symptoms will go away. Do not drive yourself to the hospital. Summary A peripherally inserted central catheter (PICC) is a long, thin, flexible tube (catheter) that is put into a vein in the arm or leg. If cared for properly, a PICC can remain in place for many months. Having a PICC can allow you to go home from the hospital sooner and continue treatment at home. The PICC is inserted using a germ-free (sterile) technique by a specially trained health care provider. Only a trained health care provider should remove it. Do not have your blood pressure checked on the arm in which your PICC is placed. Always keep your PICC identification card with you. This information is not intended to replace advice given to you by your health care provider. Make sure you discuss any questions you have with your health care provider. Document Revised: 04/27/2021 Document Reviewed: 04/27/2021 Elsevier Patient Education  2024 [employer].

## 2023-08-17 NOTE — Progress Notes (Signed)
Patient seen by Lonna Cobb NP today  Vitals are within treatment parameters:Yes   Labs are within treatment parameters: Yes   Treatment plan has been signed: Yes   Per physician team, Patient is ready for treatment and there are NO modifications to the treatment plan.

## 2023-08-21 DIAGNOSIS — J910 Malignant pleural effusion: Secondary | ICD-10-CM | POA: Diagnosis not present

## 2023-08-21 DIAGNOSIS — J9601 Acute respiratory failure with hypoxia: Secondary | ICD-10-CM | POA: Diagnosis not present

## 2023-08-23 ENCOUNTER — Telehealth: Payer: Self-pay

## 2023-08-23 NOTE — Telephone Encounter (Signed)
The patient reached out to inform us that she is experiencing a rash on her arms and legs. The rash is characterized by redness and itchiness and began after her treatment on Friday. She mentioned that she notified the infusion staff about the itching she experienced upon completing her infusion. The patient has taken Benadryl but has not found relief from her symptoms. She is scheduled to come in today for a dressing change and would like the provider to either assess her condition or prescribe a medication to alleviate the itching.

## 2023-08-24 ENCOUNTER — Inpatient Hospital Stay: Payer: No Typology Code available for payment source | Attending: Oncology

## 2023-08-24 VITALS — BP 99/64 | HR 83 | Temp 96.8°F | Resp 18

## 2023-08-24 DIAGNOSIS — Z79899 Other long term (current) drug therapy: Secondary | ICD-10-CM | POA: Diagnosis not present

## 2023-08-24 DIAGNOSIS — Z5112 Encounter for antineoplastic immunotherapy: Secondary | ICD-10-CM | POA: Diagnosis not present

## 2023-08-24 DIAGNOSIS — C342 Malignant neoplasm of middle lobe, bronchus or lung: Secondary | ICD-10-CM | POA: Insufficient documentation

## 2023-08-24 DIAGNOSIS — Z452 Encounter for adjustment and management of vascular access device: Secondary | ICD-10-CM | POA: Diagnosis not present

## 2023-08-24 MED ORDER — HEPARIN SOD (PORK) LOCK FLUSH 100 UNIT/ML IV SOLN
250.0000 [IU] | Freq: Once | INTRAVENOUS | Status: AC
  Administered 2023-08-24: 250 [IU] via INTRAVENOUS

## 2023-08-24 MED ORDER — SODIUM CHLORIDE 0.9% FLUSH
10.0000 mL | Freq: Once | INTRAVENOUS | Status: AC
  Administered 2023-08-24: 10 mL via INTRAVENOUS

## 2023-08-24 NOTE — Progress Notes (Signed)
Patient seen in infusion room by Dr. Jeanella Flattery regarding her itchiness/possible rash.  Patient reported taking benadryl PO at home to help with itchiness. Patient stated she had red dots on arms and legs but they had disappeared after taking benadryl. Per Dr. Truett Perna, rash is not likely due to her treatment.  Advised patient to keep skin moisturized. Patient verbalized understanding.

## 2023-08-28 DIAGNOSIS — E876 Hypokalemia: Secondary | ICD-10-CM | POA: Diagnosis not present

## 2023-08-28 DIAGNOSIS — C3491 Malignant neoplasm of unspecified part of right bronchus or lung: Secondary | ICD-10-CM | POA: Diagnosis not present

## 2023-08-28 DIAGNOSIS — I10 Essential (primary) hypertension: Secondary | ICD-10-CM | POA: Diagnosis not present

## 2023-08-28 DIAGNOSIS — I130 Hypertensive heart and chronic kidney disease with heart failure and stage 1 through stage 4 chronic kidney disease, or unspecified chronic kidney disease: Secondary | ICD-10-CM | POA: Diagnosis not present

## 2023-08-29 DIAGNOSIS — C7951 Secondary malignant neoplasm of bone: Secondary | ICD-10-CM | POA: Diagnosis not present

## 2023-08-29 DIAGNOSIS — C342 Malignant neoplasm of middle lobe, bronchus or lung: Secondary | ICD-10-CM | POA: Diagnosis not present

## 2023-08-29 DIAGNOSIS — C8590 Non-Hodgkin lymphoma, unspecified, unspecified site: Secondary | ICD-10-CM | POA: Diagnosis not present

## 2023-08-29 DIAGNOSIS — I629 Nontraumatic intracranial hemorrhage, unspecified: Secondary | ICD-10-CM | POA: Diagnosis not present

## 2023-08-31 ENCOUNTER — Inpatient Hospital Stay: Payer: No Typology Code available for payment source

## 2023-08-31 DIAGNOSIS — Z452 Encounter for adjustment and management of vascular access device: Secondary | ICD-10-CM

## 2023-08-31 DIAGNOSIS — Z5112 Encounter for antineoplastic immunotherapy: Secondary | ICD-10-CM | POA: Diagnosis not present

## 2023-08-31 MED ORDER — SODIUM CHLORIDE 0.9% FLUSH
10.0000 mL | Freq: Once | INTRAVENOUS | Status: AC
  Administered 2023-08-31: 10 mL via INTRAVENOUS

## 2023-08-31 MED ORDER — HEPARIN SOD (PORK) LOCK FLUSH 100 UNIT/ML IV SOLN
250.0000 [IU] | Freq: Once | INTRAVENOUS | Status: AC
  Administered 2023-08-31: 250 [IU] via INTRAVENOUS

## 2023-08-31 NOTE — Patient Instructions (Signed)
PICC Home Care Guide A peripherally inserted central catheter (PICC) is a form of IV access that allows medicines and IV fluids to be quickly put into the blood and spread throughout the body. The PICC is a long, thin, flexible tube (catheter) that is put into a vein in a person's arm or leg. The catheter ends in a large vein just outside the heart called the superior vena cava (SVC). After the PICC is put in, a chest X-ray may be done to make sure that it is in the right place. A PICC may be placed for different reasons, such as: To give medicines and liquid nutrition. To give IV fluids and blood products. To take blood samples often. If there is trouble placing a peripheral intravenous (PIV) catheter. If cared for properly, a PICC can remain in place for many months. Having a PICC can allow you to go home from the hospital sooner and continue treatment at home. Medicines and PICC care can be managed at home by a family member, caregiver, or home health care team. What are the risks? Generally, having a PICC is safe. However, problems may occur, including: A blood clot (thrombus) forming in or at the end of the PICC. A blood clot forming in a vein (deep vein thrombosis) or traveling to the lung (pulmonary embolism). Inflammation of the vein (phlebitis) in which the PICC is placed. Infection at the insertion site or in the blood. Blood infections from central lines, like PICCs, can be serious and often require a hospital stay. PICC malposition, or PICC movement or poor placement. A break or cut in the PICC. Do not use scissors near the PICC. Nerve or tendon irritation or injury during PICC insertion. How to care for your PICC Please follow the specific guidelines provided by your health care provider. Preventing infection You and any caregivers should wash your hands often with soap and water for at least 20 seconds. Wash hands: Before touching the PICC or the infusion device. Before changing a  bandage (dressing). Do not change the dressing unless you have been taught to do so and have shown you are able to change it safely. Flush the PICC as told. Tell your health care provider right away if the PICC is hard to flush or does not flush. Do not use force to flush the PICC. Use clean and germ-free (sterile) supplies only. Keep the supplies in a dry place. Do not reuse needles, syringes, or any other supplies. Reusing supplies can lead to infection. Keep the PICC dressing dry and secure it with tape if the edges stop sticking to your skin. Check your PICC insertion site every day for signs of infection. Check for: Redness, swelling, or pain. Fluid or blood. Warmth. Pus or a bad smell. Preventing other problems Do not use a syringe that is less than 10 mL to flush the PICC. Do not have your blood pressure checked on the arm in which the PICC is placed. Do not ever pull or tug on the PICC. Keep it secured to your arm with tape or a stretch wrap when not in use. Do not take the PICC out yourself. Only a trained health care provider should remove the PICC. Keep pets and children away from your PICC. How to care for your PICC dressing Keep your PICC dressing clean and dry to prevent infection. Do not take baths, swim, or use a hot tub until your health care provider approves. Ask your health care provider if you can take  showers. You may only be allowed to take sponge baths. When you are allowed to shower: Ask your health care provider to teach you how to wrap the PICC. Cover the PICC with clear plastic wrap and tape to keep it dry while showering. Follow instructions from your health care provider about how to take care of your insertion site and dressing. Make sure you: Wash your hands with soap and water for at least 20 seconds before and after you change your dressing. If soap and water are not available, use hand sanitizer. Change your dressing only if taught to do so by your health care  provider. Your PICC dressing needs to be changed if it becomes loose or wet. Leave stitches (sutures), skin glue, or adhesive strips in place. These skin closures may need to stay in place for 2 weeks or longer. If adhesive strip edges start to loosen and curl up, you may trim the loose edges. Do not remove adhesive strips completely unless your health care provider tells you to do that. Follow these instructions at home: Disposal of supplies Throw away any syringes in a disposal container that is meant for sharp items (sharps container). You can buy a sharps container from a pharmacy, or you can make one by using an empty, hard plastic bottle with a lid. Place any used dressings or infusion bags into a plastic bag. Throw that bag in the trash. General instructions  Always carry your PICC identification card or wear a medical alert bracelet. Keep the tube clamped at all times, unless it is being used. Always carry a smooth-edge clamp with you to clamp the PICC if it breaks. Do not use scissors or sharp objects near the tube. You may bend your arm and move it freely. If your PICC is near or at the bend of your elbow, avoid activity with repeated motion at the elbow. Avoid lifting heavy objects as told by your health care provider. Keep all follow-up visits. This is important. You will need to have your PICC dressing changed at least once a week. Contact a health care provider if: You have pain in your arm, ear, face, or teeth. You have a fever or chills. You have redness, swelling, or pain around the insertion site. You have fluid or blood coming from the insertion site. Your insertion site feels warm to the touch. You have pus or a bad smell coming from the insertion site. Your skin feels hard and raised around the insertion site. Your PICC dressing has gotten wet or is coming off and you have not been taught how to change it. Get help right away if: You have problems with your PICC, such as  your PICC: Was tugged or pulled and has partially come out. Do not  push the PICC back in. Cannot be flushed, is hard to flush, or leaks around the insertion site when it is flushed. Makes a flushing sound when it is flushed. Appears to have a hole or tear. Is accidentally pulled all the way out. If this happens, cover the insertion site with a gauze dressing. Do not throw the PICC away. Your health care provider will need to check it to be sure the entire catheter came out. You feel your heart racing or skipping beats, or you have chest pain. You have shortness of breath or trouble breathing. You have swelling, redness, warmth, or pain in the arm in which the PICC is placed. You have a red streak going up your arm that  starts under the PICC dressing. These symptoms may be an emergency. Get help right away. Call 911. Do not wait to see if the symptoms will go away. Do not drive yourself to the hospital. Summary A peripherally inserted central catheter (PICC) is a long, thin, flexible tube (catheter) that is put into a vein in the arm or leg. If cared for properly, a PICC can remain in place for many months. Having a PICC can allow you to go home from the hospital sooner and continue treatment at home. The PICC is inserted using a germ-free (sterile) technique by a specially trained health care provider. Only a trained health care provider should remove it. Do not have your blood pressure checked on the arm in which your PICC is placed. Always keep your PICC identification card with you. This information is not intended to replace advice given to you by your health care provider. Make sure you discuss any questions you have with your health care provider. Document Revised: 04/27/2021 Document Reviewed: 04/27/2021 Elsevier Patient Education  2024 [employer].

## 2023-09-02 ENCOUNTER — Other Ambulatory Visit: Payer: Self-pay | Admitting: Oncology

## 2023-09-06 ENCOUNTER — Other Ambulatory Visit: Payer: No Typology Code available for payment source

## 2023-09-06 ENCOUNTER — Ambulatory Visit: Payer: No Typology Code available for payment source

## 2023-09-06 ENCOUNTER — Ambulatory Visit: Payer: No Typology Code available for payment source | Admitting: Nurse Practitioner

## 2023-09-07 ENCOUNTER — Inpatient Hospital Stay: Payer: No Typology Code available for payment source

## 2023-09-07 ENCOUNTER — Inpatient Hospital Stay: Payer: No Typology Code available for payment source | Admitting: Nurse Practitioner

## 2023-09-07 ENCOUNTER — Encounter: Payer: Self-pay | Admitting: Nurse Practitioner

## 2023-09-07 VITALS — BP 116/71 | HR 82 | Temp 98.1°F | Resp 18 | Ht 65.0 in | Wt 202.1 lb

## 2023-09-07 VITALS — BP 101/60 | HR 93 | Resp 18

## 2023-09-07 DIAGNOSIS — C342 Malignant neoplasm of middle lobe, bronchus or lung: Secondary | ICD-10-CM | POA: Diagnosis not present

## 2023-09-07 DIAGNOSIS — C3490 Malignant neoplasm of unspecified part of unspecified bronchus or lung: Secondary | ICD-10-CM | POA: Diagnosis not present

## 2023-09-07 DIAGNOSIS — Z5112 Encounter for antineoplastic immunotherapy: Secondary | ICD-10-CM | POA: Diagnosis not present

## 2023-09-07 LAB — CMP (CANCER CENTER ONLY)
ALT: 18 U/L (ref 0–44)
AST: 21 U/L (ref 15–41)
Albumin: 3.7 g/dL (ref 3.5–5.0)
Alkaline Phosphatase: 96 U/L (ref 38–126)
Anion gap: 5 (ref 5–15)
BUN: 24 mg/dL — ABNORMAL HIGH (ref 8–23)
CO2: 30 mmol/L (ref 22–32)
Calcium: 8.9 mg/dL (ref 8.9–10.3)
Chloride: 106 mmol/L (ref 98–111)
Creatinine: 1.28 mg/dL — ABNORMAL HIGH (ref 0.44–1.00)
GFR, Estimated: 43 mL/min — ABNORMAL LOW (ref >60)
Glucose, Bld: 90 mg/dL (ref 70–99)
Potassium: 4.3 mmol/L (ref 3.5–5.1)
Sodium: 141 mmol/L (ref 135–145)
Total Bilirubin: 0.4 mg/dL (ref <1.2)
Total Protein: 6.7 g/dL (ref 6.5–8.1)

## 2023-09-07 LAB — CBC WITH DIFFERENTIAL (CANCER CENTER ONLY)
Abs Immature Granulocytes: 0.01 10*3/uL (ref 0.00–0.07)
Basophils Absolute: 0 10*3/uL (ref 0.0–0.1)
Basophils Relative: 1 %
Eosinophils Absolute: 0 10*3/uL (ref 0.0–0.5)
Eosinophils Relative: 0 %
HCT: 36.8 % (ref 36.0–46.0)
Hemoglobin: 12 g/dL (ref 12.0–15.0)
Immature Granulocytes: 0 %
Lymphocytes Relative: 37 %
Lymphs Abs: 1.7 10*3/uL (ref 0.7–4.0)
MCH: 31 pg (ref 26.0–34.0)
MCHC: 32.6 g/dL (ref 30.0–36.0)
MCV: 95.1 fL (ref 80.0–100.0)
Monocytes Absolute: 0.4 10*3/uL (ref 0.1–1.0)
Monocytes Relative: 8 %
Neutro Abs: 2.4 10*3/uL (ref 1.7–7.7)
Neutrophils Relative %: 54 %
Platelet Count: 193 10*3/uL (ref 150–400)
RBC: 3.87 MIL/uL (ref 3.87–5.11)
RDW: 14.6 % (ref 11.5–15.5)
WBC Count: 4.4 10*3/uL (ref 4.0–10.5)
nRBC: 0 % (ref 0.0–0.2)

## 2023-09-07 MED ORDER — SODIUM CHLORIDE 0.9 % IV SOLN: Freq: Once | INTRAVENOUS | Status: AC

## 2023-09-07 MED ORDER — SODIUM CHLORIDE 0.9 % IV SOLN
1200.0000 mg | Freq: Once | INTRAVENOUS | Status: AC
  Administered 2023-09-07: 1200 mg via INTRAVENOUS
  Filled 2023-09-07: qty 20

## 2023-09-07 MED ORDER — SODIUM CHLORIDE 0.9% FLUSH
3.0000 mL | INTRAVENOUS | Status: DC | PRN
Start: 2023-09-07 — End: 2023-09-07
  Administered 2023-09-07: 3 mL

## 2023-09-07 MED ORDER — HEPARIN SOD (PORK) LOCK FLUSH 100 UNIT/ML IV SOLN
250.0000 [IU] | Freq: Once | INTRAVENOUS | Status: AC | PRN
  Administered 2023-09-07: 250 [IU]
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
## 2023-09-07 NOTE — Progress Notes (Signed)
Patient seen by Lonna Cobb NP today  Vitals are within treatment parameters:Yes   Labs are within treatment parameters: Yes   Treatment plan has been signed: Yes   Per physician team, Patient is ready for treatment and there are NO modifications to the treatment plan.

## 2023-09-07 NOTE — Patient Instructions (Signed)
[facility] - A DEPT OF [facility]   Discharge Instructions: Thank you for choosing [facility] to provide your oncology and hematology care.   If you have a lab appointment with the Cancer Center, please go directly to the Cancer Center and check in at the registration area.   Wear comfortable clothing and clothing appropriate for easy access to any Portacath or PICC line.   We strive to give you quality time with your provider. You may need to reschedule your appointment if you arrive late (15 or more minutes).  Arriving late affects you and other patients whose appointments are after yours.  Also, if you miss three or more appointments without notifying the office, you may be dismissed from the clinic at the provider's discretion.      For prescription refill requests, have your pharmacy contact our office and allow 72 hours for refills to be completed.    Today you received the following chemotherapy and/or immunotherapy agents Atezolizumab (TECENTRIQ).      To help prevent nausea and vomiting after your treatment, we encourage you to take your nausea medication as directed.  BELOW ARE SYMPTOMS THAT SHOULD BE REPORTED IMMEDIATELY: *FEVER GREATER THAN 100.4 F (38 C) OR HIGHER *CHILLS OR SWEATING *NAUSEA AND VOMITING THAT IS NOT CONTROLLED WITH YOUR NAUSEA MEDICATION *UNUSUAL SHORTNESS OF BREATH *UNUSUAL BRUISING OR BLEEDING *URINARY PROBLEMS (pain or burning when urinating, or frequent urination) *BOWEL PROBLEMS (unusual diarrhea, constipation, pain near the anus) TENDERNESS IN MOUTH AND THROAT WITH OR WITHOUT PRESENCE OF ULCERS (sore throat, sores in mouth, or a toothache) UNUSUAL RASH, SWELLING OR PAIN  UNUSUAL VAGINAL DISCHARGE OR ITCHING   Items with * indicate a potential emergency and should be followed up as soon as possible or go to the Emergency Department if any problems should occur.  Please show the CHEMOTHERAPY ALERT CARD  or IMMUNOTHERAPY ALERT CARD at check-in to the Emergency Department and triage nurse.  Should you have questions after your visit or need to cancel or reschedule your appointment, please contact [facility] - A DEPT OF [facility]  Dept: [phone]  and follow the prompts.  Office hours are 8:00 a.m. to 4:30 p.m. Monday - Friday. Please note that voicemails left after 4:00 p.m. may not be returned until the following business day.  We are closed weekends and major holidays. You have access to a nurse at all times for urgent questions. Please call the main number to the clinic Dept: [phone] and follow the prompts.   For any non-urgent questions, you may also contact your provider using MyChart. We now offer e-Visits for anyone 89 and older to request care online for non-urgent symptoms. For details visit [email or web address].   Also download the MyChart app! Go to the app store, search "MyChart", open the app, select , and log in with your MyChart username and password.  Atezolizumab Injection What is this medication? ATEZOLIZUMAB (a te zoe LIZ ue mab) treats some types of cancer. It works by helping your immune system slow or stop the spread of cancer cells. It is a monoclonal antibody. This medicine may be used for other purposes; ask your health care provider or pharmacist if you have questions. COMMON BRAND NAME(S): Tecentriq What should I tell my care team before I take this medication? They need to know if you have any of these conditions: Allogeneic stem cell transplant (uses someone else's stem cells) Autoimmune  diseases, such as Crohn disease, ulcerative colitis, lupus History of chest radiation Nervous system problems, such as Guillain-Barre syndrome, myasthenia gravis Organ transplant An unusual or allergic reaction to atezolizumab, other medications, foods, dyes, or preservatives Pregnant or trying to get  pregnant Breast-feeding How should I use this medication? This medication is injected into a vein. It is given by your care team in a hospital or clinic setting. A special MedGuide will be given to you before each treatment. Be sure to read this information carefully each time. Talk to your care team about the use of this medication in children. While it may be prescribed for children as young as 2 years for selected conditions, precautions do apply. Overdosage: If you think you have taken too much of this medicine contact a poison control center or emergency room at once. NOTE: This medicine is only for you. Do not share this medicine with others. What if I miss a dose? Keep appointments for follow-up doses. It is important not to miss your dose. Call your care team if you are unable to keep an appointment. What may interact with this medication? Interactions have not been studied. This list may not describe all possible interactions. Give your health care provider a list of all the medicines, herbs, non-prescription drugs, or dietary supplements you use. Also tell them if you smoke, drink alcohol, or use illegal drugs. Some items may interact with your medicine. What should I watch for while using this medication? Your condition will be monitored carefully while you are receiving this medication. You may need blood work while taking this medication. This medication may cause serious skin reactions. They can happen weeks to months after starting the medication. Contact your care team right away if you notice fevers or flu-like symptoms with a rash. The rash may be red or purple and then turn into blisters or peeling of the skin. You may also notice a red rash with swelling of the face, lips, or lymph nodes in your neck or under your arms. Tell your care team right away if you have any change in your eyesight. Talk to your care team if you may be pregnant. Serious birth defects can occur if you take  this medication during pregnancy and for 5 months after the last dose. You will need a negative pregnancy test before starting this medication. Contraception is recommended while taking this medication and for 5 months after the last dose. Your care team can help you find the option that works for you. Do not breastfeed while taking this medication and for at least 5 months after the last dose. What side effects may I notice from receiving this medication? Side effects that you should report to your doctor or health care professional as soon as possible: Allergic reactions--skin rash, itching, hives, swelling of the face, lips, tongue, or throat Dry cough, shortness of breath or trouble breathing Eye pain, redness, irritation, or discharge with blurry or decreased vision Heart muscle inflammation--unusual weakness or fatigue, shortness of breath, chest pain, fast or irregular heartbeat, dizziness, swelling of the ankles, feet, or hands Hormone gland problems--headache, sensitivity to light, unusual weakness or fatigue, dizziness, fast or irregular heartbeat, increased sensitivity to cold or heat, excessive sweating, constipation, hair loss, increased thirst or amount of urine, tremors or shaking, irritability Infusion reactions--chest pain, shortness of breath or trouble breathing, feeling faint or lightheaded Kidney injury (glomerulonephritis)--decrease in the amount of urine, red or dark brown urine, foamy or bubbly urine, swelling of the  ankles, hands, or feet Liver injury--right upper belly pain, loss of appetite, nausea, light-colored stool, dark yellow or brown urine, yellowing skin or eyes, unusual weakness or fatigue Pain, tingling, or numbness in the hands or feet, muscle weakness, change in vision, confusion or trouble speaking, loss of balance or coordination, trouble walking, seizures Rash, fever, and swollen lymph nodes Redness, blistering, peeling, or loosening of the skin, including  inside the mouth Sudden or severe stomach pain, bloody diarrhea, fever, nausea, vomiting Side effects that usually do not require medical attention (report to your doctor or health care professional if they continue or are bothersome): Bone, joint, or muscle pain Diarrhea Fatigue Loss of appetite Nausea Skin rash This list may not describe all possible side effects. Call your doctor for medical advice about side effects. You may report side effects to FDA at 1-800-FDA-1088. Where should I keep my medication? This medication is given in a hospital or clinic. It will not be stored at home. NOTE: This sheet is a summary. It may not cover all possible information. If you have questions about this medicine, talk to your doctor, pharmacist, or health care provider.  2024 Elsevier/Gold Standard (2022-02-24 00:00:00)

## 2023-09-14 ENCOUNTER — Inpatient Hospital Stay: Payer: No Typology Code available for payment source

## 2023-09-14 VITALS — BP 127/82 | HR 86 | Temp 98.0°F | Resp 17

## 2023-09-14 DIAGNOSIS — Z95828 Presence of other vascular implants and grafts: Secondary | ICD-10-CM

## 2023-09-14 DIAGNOSIS — Z5112 Encounter for antineoplastic immunotherapy: Secondary | ICD-10-CM | POA: Diagnosis not present

## 2023-09-14 MED ORDER — SODIUM CHLORIDE 0.9% FLUSH
10.0000 mL | Freq: Once | INTRAVENOUS | Status: AC
  Administered 2023-09-14: 10 mL via INTRAVENOUS

## 2023-09-14 MED ORDER — HEPARIN SOD (PORK) LOCK FLUSH 100 UNIT/ML IV SOLN
500.0000 [IU] | Freq: Once | INTRAVENOUS | Status: AC
  Administered 2023-09-14: 500 [IU] via INTRAVENOUS

## 2023-09-14 NOTE — Patient Instructions (Signed)
PICC Home Care Guide A peripherally inserted central catheter (PICC) is a form of IV access that allows medicines and IV fluids to be quickly put into the blood and spread throughout the body. The PICC is a long, thin, flexible tube (catheter) that is put into a vein in a person's arm or leg. The catheter ends in a large vein just outside the heart called the superior vena cava (SVC). After the PICC is put in, a chest X-ray may be done to make sure that it is in the right place. A PICC may be placed for different reasons, such as: To give medicines and liquid nutrition. To give IV fluids and blood products. To take blood samples often. If there is trouble placing a peripheral intravenous (PIV) catheter. If cared for properly, a PICC can remain in place for many months. Having a PICC can allow you to go home from the hospital sooner and continue treatment at home. Medicines and PICC care can be managed at home by a family member, caregiver, or home health care team. What are the risks? Generally, having a PICC is safe. However, problems may occur, including: A blood clot (thrombus) forming in or at the end of the PICC. A blood clot forming in a vein (deep vein thrombosis) or traveling to the lung (pulmonary embolism). Inflammation of the vein (phlebitis) in which the PICC is placed. Infection at the insertion site or in the blood. Blood infections from central lines, like PICCs, can be serious and often require a hospital stay. PICC malposition, or PICC movement or poor placement. A break or cut in the PICC. Do not use scissors near the PICC. Nerve or tendon irritation or injury during PICC insertion. How to care for your PICC Please follow the specific guidelines provided by your health care provider. Preventing infection You and any caregivers should wash your hands often with soap and water for at least 20 seconds. Wash hands: Before touching the PICC or the infusion device. Before changing a  bandage (dressing). Do not change the dressing unless you have been taught to do so and have shown you are able to change it safely. Flush the PICC as told. Tell your health care provider right away if the PICC is hard to flush or does not flush. Do not use force to flush the PICC. Use clean and germ-free (sterile) supplies only. Keep the supplies in a dry place. Do not reuse needles, syringes, or any other supplies. Reusing supplies can lead to infection. Keep the PICC dressing dry and secure it with tape if the edges stop sticking to your skin. Check your PICC insertion site every day for signs of infection. Check for: Redness, swelling, or pain. Fluid or blood. Warmth. Pus or a bad smell. Preventing other problems Do not use a syringe that is less than 10 mL to flush the PICC. Do not have your blood pressure checked on the arm in which the PICC is placed. Do not ever pull or tug on the PICC. Keep it secured to your arm with tape or a stretch wrap when not in use. Do not take the PICC out yourself. Only a trained health care provider should remove the PICC. Keep pets and children away from your PICC. How to care for your PICC dressing Keep your PICC dressing clean and dry to prevent infection. Do not take baths, swim, or use a hot tub until your health care provider approves. Ask your health care provider if you can take  showers. You may only be allowed to take sponge baths. When you are allowed to shower: Ask your health care provider to teach you how to wrap the PICC. Cover the PICC with clear plastic wrap and tape to keep it dry while showering. Follow instructions from your health care provider about how to take care of your insertion site and dressing. Make sure you: Wash your hands with soap and water for at least 20 seconds before and after you change your dressing. If soap and water are not available, use hand sanitizer. Change your dressing only if taught to do so by your health care  provider. Your PICC dressing needs to be changed if it becomes loose or wet. Leave stitches (sutures), skin glue, or adhesive strips in place. These skin closures may need to stay in place for 2 weeks or longer. If adhesive strip edges start to loosen and curl up, you may trim the loose edges. Do not remove adhesive strips completely unless your health care provider tells you to do that. Follow these instructions at home: Disposal of supplies Throw away any syringes in a disposal container that is meant for sharp items (sharps container). You can buy a sharps container from a pharmacy, or you can make one by using an empty, hard plastic bottle with a lid. Place any used dressings or infusion bags into a plastic bag. Throw that bag in the trash. General instructions  Always carry your PICC identification card or wear a medical alert bracelet. Keep the tube clamped at all times, unless it is being used. Always carry a smooth-edge clamp with you to clamp the PICC if it breaks. Do not use scissors or sharp objects near the tube. You may bend your arm and move it freely. If your PICC is near or at the bend of your elbow, avoid activity with repeated motion at the elbow. Avoid lifting heavy objects as told by your health care provider. Keep all follow-up visits. This is important. You will need to have your PICC dressing changed at least once a week. Contact a health care provider if: You have pain in your arm, ear, face, or teeth. You have a fever or chills. You have redness, swelling, or pain around the insertion site. You have fluid or blood coming from the insertion site. Your insertion site feels warm to the touch. You have pus or a bad smell coming from the insertion site. Your skin feels hard and raised around the insertion site. Your PICC dressing has gotten wet or is coming off and you have not been taught how to change it. Get help right away if: You have problems with your PICC, such as  your PICC: Was tugged or pulled and has partially come out. Do not  push the PICC back in. Cannot be flushed, is hard to flush, or leaks around the insertion site when it is flushed. Makes a flushing sound when it is flushed. Appears to have a hole or tear. Is accidentally pulled all the way out. If this happens, cover the insertion site with a gauze dressing. Do not throw the PICC away. Your health care provider will need to check it to be sure the entire catheter came out. You feel your heart racing or skipping beats, or you have chest pain. You have shortness of breath or trouble breathing. You have swelling, redness, warmth, or pain in the arm in which the PICC is placed. You have a red streak going up your arm that  starts under the PICC dressing. These symptoms may be an emergency. Get help right away. Call 911. Do not wait to see if the symptoms will go away. Do not drive yourself to the hospital. Summary A peripherally inserted central catheter (PICC) is a long, thin, flexible tube (catheter) that is put into a vein in the arm or leg. If cared for properly, a PICC can remain in place for many months. Having a PICC can allow you to go home from the hospital sooner and continue treatment at home. The PICC is inserted using a germ-free (sterile) technique by a specially trained health care provider. Only a trained health care provider should remove it. Do not have your blood pressure checked on the arm in which your PICC is placed. Always keep your PICC identification card with you. This information is not intended to replace advice given to you by your health care provider. Make sure you discuss any questions you have with your health care provider. Document Revised: 04/27/2021 Document Reviewed: 04/27/2021 Elsevier Patient Education  2024 [employer].

## 2023-09-21 ENCOUNTER — Other Ambulatory Visit: Payer: Self-pay | Admitting: Oncology

## 2023-09-21 ENCOUNTER — Inpatient Hospital Stay: Payer: No Typology Code available for payment source

## 2023-09-21 DIAGNOSIS — J910 Malignant pleural effusion: Secondary | ICD-10-CM | POA: Diagnosis not present

## 2023-09-21 DIAGNOSIS — Z452 Encounter for adjustment and management of vascular access device: Secondary | ICD-10-CM

## 2023-09-21 DIAGNOSIS — J9601 Acute respiratory failure with hypoxia: Secondary | ICD-10-CM | POA: Diagnosis not present

## 2023-09-21 DIAGNOSIS — Z5112 Encounter for antineoplastic immunotherapy: Secondary | ICD-10-CM | POA: Diagnosis not present

## 2023-09-21 MED ORDER — SODIUM CHLORIDE 0.9% FLUSH
10.0000 mL | Freq: Once | INTRAVENOUS | Status: AC
  Administered 2023-09-21: 10 mL

## 2023-09-21 MED ORDER — HEPARIN SOD (PORK) LOCK FLUSH 100 UNIT/ML IV SOLN
500.0000 [IU] | Freq: Once | INTRAVENOUS | Status: AC
  Administered 2023-09-21: 500 [IU]

## 2023-09-25 DIAGNOSIS — F411 Generalized anxiety disorder: Secondary | ICD-10-CM | POA: Diagnosis not present

## 2023-09-25 DIAGNOSIS — I130 Hypertensive heart and chronic kidney disease with heart failure and stage 1 through stage 4 chronic kidney disease, or unspecified chronic kidney disease: Secondary | ICD-10-CM | POA: Diagnosis not present

## 2023-09-25 DIAGNOSIS — E876 Hypokalemia: Secondary | ICD-10-CM | POA: Diagnosis not present

## 2023-09-26 DIAGNOSIS — C7800 Secondary malignant neoplasm of unspecified lung: Secondary | ICD-10-CM | POA: Diagnosis not present

## 2023-09-26 DIAGNOSIS — Z86718 Personal history of other venous thrombosis and embolism: Secondary | ICD-10-CM | POA: Diagnosis not present

## 2023-09-26 DIAGNOSIS — Z7901 Long term (current) use of anticoagulants: Secondary | ICD-10-CM | POA: Diagnosis not present

## 2023-09-26 DIAGNOSIS — I4711 Inappropriate sinus tachycardia, so stated: Secondary | ICD-10-CM | POA: Diagnosis not present

## 2023-09-27 ENCOUNTER — Inpatient Hospital Stay: Payer: No Typology Code available for payment source

## 2023-09-27 ENCOUNTER — Inpatient Hospital Stay: Payer: No Typology Code available for payment source | Attending: Oncology

## 2023-09-27 ENCOUNTER — Inpatient Hospital Stay: Payer: No Typology Code available for payment source | Admitting: Oncology

## 2023-09-27 VITALS — BP 113/85 | HR 89 | Temp 97.9°F | Resp 18 | Ht 65.0 in | Wt 202.0 lb

## 2023-09-27 VITALS — BP 131/71 | HR 96 | Resp 18

## 2023-09-27 DIAGNOSIS — Z79620 Long term (current) use of immunosuppressive biologic: Secondary | ICD-10-CM | POA: Insufficient documentation

## 2023-09-27 DIAGNOSIS — Z5112 Encounter for antineoplastic immunotherapy: Secondary | ICD-10-CM | POA: Insufficient documentation

## 2023-09-27 DIAGNOSIS — C3490 Malignant neoplasm of unspecified part of unspecified bronchus or lung: Secondary | ICD-10-CM | POA: Diagnosis not present

## 2023-09-27 DIAGNOSIS — R300 Dysuria: Secondary | ICD-10-CM

## 2023-09-27 DIAGNOSIS — C342 Malignant neoplasm of middle lobe, bronchus or lung: Secondary | ICD-10-CM

## 2023-09-27 DIAGNOSIS — Z452 Encounter for adjustment and management of vascular access device: Secondary | ICD-10-CM | POA: Insufficient documentation

## 2023-09-27 DIAGNOSIS — R350 Frequency of micturition: Secondary | ICD-10-CM | POA: Diagnosis not present

## 2023-09-27 LAB — CBC WITH DIFFERENTIAL (CANCER CENTER ONLY)
Abs Immature Granulocytes: 0.01 10*3/uL (ref 0.00–0.07)
Basophils Absolute: 0 10*3/uL (ref 0.0–0.1)
Basophils Relative: 1 %
Eosinophils Absolute: 0 10*3/uL (ref 0.0–0.5)
Eosinophils Relative: 0 %
HCT: 37.2 % (ref 36.0–46.0)
Hemoglobin: 11.8 g/dL — ABNORMAL LOW (ref 12.0–15.0)
Immature Granulocytes: 0 %
Lymphocytes Relative: 32 %
Lymphs Abs: 1.4 10*3/uL (ref 0.7–4.0)
MCH: 30.6 pg (ref 26.0–34.0)
MCHC: 31.7 g/dL (ref 30.0–36.0)
MCV: 96.6 fL (ref 80.0–100.0)
Monocytes Absolute: 0.4 10*3/uL (ref 0.1–1.0)
Monocytes Relative: 9 %
Neutro Abs: 2.6 10*3/uL (ref 1.7–7.7)
Neutrophils Relative %: 58 %
Platelet Count: 204 10*3/uL (ref 150–400)
RBC: 3.85 MIL/uL — ABNORMAL LOW (ref 3.87–5.11)
RDW: 14.9 % (ref 11.5–15.5)
WBC Count: 4.5 10*3/uL (ref 4.0–10.5)
nRBC: 0 % (ref 0.0–0.2)

## 2023-09-27 LAB — CMP (CANCER CENTER ONLY)
ALT: 15 U/L (ref 0–44)
AST: 18 U/L (ref 15–41)
Albumin: 3.7 g/dL (ref 3.5–5.0)
Alkaline Phosphatase: 88 U/L (ref 38–126)
Anion gap: 6 (ref 5–15)
BUN: 26 mg/dL — ABNORMAL HIGH (ref 8–23)
CO2: 28 mmol/L (ref 22–32)
Calcium: 9.1 mg/dL (ref 8.9–10.3)
Chloride: 106 mmol/L (ref 98–111)
Creatinine: 1.28 mg/dL — ABNORMAL HIGH (ref 0.44–1.00)
GFR, Estimated: 43 mL/min — ABNORMAL LOW (ref >60)
Glucose, Bld: 101 mg/dL — ABNORMAL HIGH (ref 70–99)
Potassium: 4.1 mmol/L (ref 3.5–5.1)
Sodium: 140 mmol/L (ref 135–145)
Total Bilirubin: 0.5 mg/dL (ref <1.2)
Total Protein: 6.7 g/dL (ref 6.5–8.1)

## 2023-09-27 LAB — TSH: TSH: 2.581 u[IU]/mL (ref 0.350–4.500)

## 2023-09-27 MED ORDER — HEPARIN SOD (PORK) LOCK FLUSH 100 UNIT/ML IV SOLN
250.0000 [IU] | Freq: Once | INTRAVENOUS | Status: AC | PRN
  Administered 2023-09-27: 250 [IU]

## 2023-09-27 MED ORDER — SODIUM CHLORIDE 0.9% FLUSH
3.0000 mL | INTRAVENOUS | Status: DC | PRN
Start: 2023-09-27 — End: 2023-09-27
  Administered 2023-09-27: 3 mL

## 2023-09-27 MED ORDER — SODIUM CHLORIDE 0.9 % IV SOLN: Freq: Once | INTRAVENOUS | Status: AC

## 2023-09-27 MED ORDER — ATEZOLIZUMAB CHEMO INJECTION 1200 MG/20ML
1200.0000 mg | Freq: Once | INTRAVENOUS | Status: AC
  Administered 2023-09-27: 1200 mg via INTRAVENOUS
  Filled 2023-09-27: qty 20
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
## 2023-09-27 NOTE — Progress Notes (Signed)
Patient unable to provide urine sample after 2 attempts. Specimen cup provided to take home and have sister bring back to clinic when collected.

## 2023-09-27 NOTE — Progress Notes (Signed)
Patient seen by Dr. Thornton Papas today  Vitals are within treatment parameters:Yes   Labs are within treatment parameters: Yes   Treatment plan has been signed: Yes   Per physician team, Patient is ready for treatment and there are NO modifications to the treatment plan.

## 2023-09-28 DIAGNOSIS — I629 Nontraumatic intracranial hemorrhage, unspecified: Secondary | ICD-10-CM | POA: Diagnosis not present

## 2023-09-28 DIAGNOSIS — C342 Malignant neoplasm of middle lobe, bronchus or lung: Secondary | ICD-10-CM | POA: Diagnosis not present

## 2023-09-28 DIAGNOSIS — C8590 Non-Hodgkin lymphoma, unspecified, unspecified site: Secondary | ICD-10-CM | POA: Diagnosis not present

## 2023-09-28 DIAGNOSIS — C7951 Secondary malignant neoplasm of bone: Secondary | ICD-10-CM | POA: Diagnosis not present

## 2023-09-28 LAB — T4: T4, Total: 6.2 ug/dL (ref 4.5–12.0)

## 2023-10-01 ENCOUNTER — Telehealth: Payer: Self-pay | Admitting: *Deleted

## 2023-10-01 NOTE — Telephone Encounter (Signed)
LVM with CT appointment on 12/31 at [facility]. Arrive at 1:45 for 2 pm scan.

## 2023-10-05 ENCOUNTER — Inpatient Hospital Stay: Payer: No Typology Code available for payment source

## 2023-10-05 VITALS — BP 100/62 | HR 99 | Temp 98.2°F | Resp 18

## 2023-10-05 DIAGNOSIS — Z452 Encounter for adjustment and management of vascular access device: Secondary | ICD-10-CM

## 2023-10-05 DIAGNOSIS — Z5112 Encounter for antineoplastic immunotherapy: Secondary | ICD-10-CM | POA: Diagnosis not present

## 2023-10-05 MED ORDER — HEPARIN SOD (PORK) LOCK FLUSH 100 UNIT/ML IV SOLN
250.0000 [IU] | Freq: Once | INTRAVENOUS | Status: AC
Start: 2023-10-05 — End: 2023-10-05
  Administered 2023-10-05: 250 [IU] via INTRAVENOUS

## 2023-10-05 MED ORDER — SODIUM CHLORIDE 0.9% FLUSH
10.0000 mL | Freq: Once | INTRAVENOUS | Status: AC
Start: 2023-10-05 — End: 2023-10-05
  Administered 2023-10-05: 10 mL via INTRAVENOUS

## 2023-10-05 MED ORDER — HEPARIN SOD (PORK) LOCK FLUSH 100 UNIT/ML IV SOLN
250.0000 [IU] | Freq: Once | INTRAVENOUS | Status: AC
  Administered 2023-10-05: 250 [IU] via INTRAVENOUS

## 2023-10-12 ENCOUNTER — Other Ambulatory Visit: Payer: Self-pay

## 2023-10-12 ENCOUNTER — Inpatient Hospital Stay: Payer: No Typology Code available for payment source

## 2023-10-12 VITALS — BP 119/70 | HR 95 | Temp 98.0°F | Resp 18

## 2023-10-12 DIAGNOSIS — D649 Anemia, unspecified: Secondary | ICD-10-CM

## 2023-10-12 DIAGNOSIS — Z452 Encounter for adjustment and management of vascular access device: Secondary | ICD-10-CM

## 2023-10-12 DIAGNOSIS — Z5112 Encounter for antineoplastic immunotherapy: Secondary | ICD-10-CM | POA: Diagnosis not present

## 2023-10-12 LAB — URINALYSIS, COMPLETE (UACMP) WITH MICROSCOPIC
Bacteria, UA: NONE SEEN
Bilirubin Urine: NEGATIVE
Glucose, UA: NEGATIVE mg/dL
Hgb urine dipstick: NEGATIVE
Ketones, ur: NEGATIVE mg/dL
Nitrite: NEGATIVE
Protein, ur: NEGATIVE mg/dL
Specific Gravity, Urine: 1.012 (ref 1.005–1.030)
pH: 6 (ref 5.0–8.0)

## 2023-10-12 MED ORDER — HEPARIN SOD (PORK) LOCK FLUSH 100 UNIT/ML IV SOLN
500.0000 [IU] | Freq: Once | INTRAVENOUS | Status: AC
  Administered 2023-10-12: 500 [IU]

## 2023-10-12 MED ORDER — SODIUM CHLORIDE 0.9% FLUSH
10.0000 mL | Freq: Once | INTRAVENOUS | Status: AC
  Administered 2023-10-12: 10 mL

## 2023-10-12 NOTE — Patient Instructions (Signed)
PICC Home Care Guide A peripherally inserted central catheter (PICC) is a form of IV access that allows medicines and IV fluids to be quickly put into the blood and spread throughout the body. The PICC is a long, thin, flexible tube (catheter) that is put into a vein in a person's arm or leg. The catheter ends in a large vein just outside the heart called the superior vena cava (SVC). After the PICC is put in, a chest X-ray may be done to make sure that it is in the right place. A PICC may be placed for different reasons, such as: To give medicines and liquid nutrition. To give IV fluids and blood products. To take blood samples often. If there is trouble placing a peripheral intravenous (PIV) catheter. If cared for properly, a PICC can remain in place for many months. Having a PICC can allow you to go home from the hospital sooner and continue treatment at home. Medicines and PICC care can be managed at home by a family member, caregiver, or home health care team. What are the risks? Generally, having a PICC is safe. However, problems may occur, including: A blood clot (thrombus) forming in or at the end of the PICC. A blood clot forming in a vein (deep vein thrombosis) or traveling to the lung (pulmonary embolism). Inflammation of the vein (phlebitis) in which the PICC is placed. Infection at the insertion site or in the blood. Blood infections from central lines, like PICCs, can be serious and often require a hospital stay. PICC malposition, or PICC movement or poor placement. A break or cut in the PICC. Do not use scissors near the PICC. Nerve or tendon irritation or injury during PICC insertion. How to care for your PICC Please follow the specific guidelines provided by your health care provider. Preventing infection You and any caregivers should wash your hands often with soap and water for at least 20 seconds. Wash hands: Before touching the PICC or the infusion device. Before changing a  bandage (dressing). Do not change the dressing unless you have been taught to do so and have shown you are able to change it safely. Flush the PICC as told. Tell your health care provider right away if the PICC is hard to flush or does not flush. Do not use force to flush the PICC. Use clean and germ-free (sterile) supplies only. Keep the supplies in a dry place. Do not reuse needles, syringes, or any other supplies. Reusing supplies can lead to infection. Keep the PICC dressing dry and secure it with tape if the edges stop sticking to your skin. Check your PICC insertion site every day for signs of infection. Check for: Redness, swelling, or pain. Fluid or blood. Warmth. Pus or a bad smell. Preventing other problems Do not use a syringe that is less than 10 mL to flush the PICC. Do not have your blood pressure checked on the arm in which the PICC is placed. Do not ever pull or tug on the PICC. Keep it secured to your arm with tape or a stretch wrap when not in use. Do not take the PICC out yourself. Only a trained health care provider should remove the PICC. Keep pets and children away from your PICC. How to care for your PICC dressing Keep your PICC dressing clean and dry to prevent infection. Do not take baths, swim, or use a hot tub until your health care provider approves. Ask your health care provider if you can take  showers. You may only be allowed to take sponge baths. When you are allowed to shower: Ask your health care provider to teach you how to wrap the PICC. Cover the PICC with clear plastic wrap and tape to keep it dry while showering. Follow instructions from your health care provider about how to take care of your insertion site and dressing. Make sure you: Wash your hands with soap and water for at least 20 seconds before and after you change your dressing. If soap and water are not available, use hand sanitizer. Change your dressing only if taught to do so by your health care  provider. Your PICC dressing needs to be changed if it becomes loose or wet. Leave stitches (sutures), skin glue, or adhesive strips in place. These skin closures may need to stay in place for 2 weeks or longer. If adhesive strip edges start to loosen and curl up, you may trim the loose edges. Do not remove adhesive strips completely unless your health care provider tells you to do that. Follow these instructions at home: Disposal of supplies Throw away any syringes in a disposal container that is meant for sharp items (sharps container). You can buy a sharps container from a pharmacy, or you can make one by using an empty, hard plastic bottle with a lid. Place any used dressings or infusion bags into a plastic bag. Throw that bag in the trash. General instructions  Always carry your PICC identification card or wear a medical alert bracelet. Keep the tube clamped at all times, unless it is being used. Always carry a smooth-edge clamp with you to clamp the PICC if it breaks. Do not use scissors or sharp objects near the tube. You may bend your arm and move it freely. If your PICC is near or at the bend of your elbow, avoid activity with repeated motion at the elbow. Avoid lifting heavy objects as told by your health care provider. Keep all follow-up visits. This is important. You will need to have your PICC dressing changed at least once a week. Contact a health care provider if: You have pain in your arm, ear, face, or teeth. You have a fever or chills. You have redness, swelling, or pain around the insertion site. You have fluid or blood coming from the insertion site. Your insertion site feels warm to the touch. You have pus or a bad smell coming from the insertion site. Your skin feels hard and raised around the insertion site. Your PICC dressing has gotten wet or is coming off and you have not been taught how to change it. Get help right away if: You have problems with your PICC, such as  your PICC: Was tugged or pulled and has partially come out. Do not  push the PICC back in. Cannot be flushed, is hard to flush, or leaks around the insertion site when it is flushed. Makes a flushing sound when it is flushed. Appears to have a hole or tear. Is accidentally pulled all the way out. If this happens, cover the insertion site with a gauze dressing. Do not throw the PICC away. Your health care provider will need to check it to be sure the entire catheter came out. You feel your heart racing or skipping beats, or you have chest pain. You have shortness of breath or trouble breathing. You have swelling, redness, warmth, or pain in the arm in which the PICC is placed. You have a red streak going up your arm that  starts under the PICC dressing. These symptoms may be an emergency. Get help right away. Call 911. Do not wait to see if the symptoms will go away. Do not drive yourself to the hospital. Summary A peripherally inserted central catheter (PICC) is a long, thin, flexible tube (catheter) that is put into a vein in the arm or leg. If cared for properly, a PICC can remain in place for many months. Having a PICC can allow you to go home from the hospital sooner and continue treatment at home. The PICC is inserted using a germ-free (sterile) technique by a specially trained health care provider. Only a trained health care provider should remove it. Do not have your blood pressure checked on the arm in which your PICC is placed. Always keep your PICC identification card with you. This information is not intended to replace advice given to you by your health care provider. Make sure you discuss any questions you have with your health care provider. Document Revised: 04/27/2021 Document Reviewed: 04/27/2021 Elsevier Patient Education  2024 [employer].

## 2023-10-13 LAB — URINE CULTURE

## 2023-10-19 ENCOUNTER — Inpatient Hospital Stay: Payer: No Typology Code available for payment source

## 2023-10-19 VITALS — BP 84/54 | HR 89 | Temp 98.2°F | Resp 18

## 2023-10-19 DIAGNOSIS — Z452 Encounter for adjustment and management of vascular access device: Secondary | ICD-10-CM

## 2023-10-19 DIAGNOSIS — Z5112 Encounter for antineoplastic immunotherapy: Secondary | ICD-10-CM | POA: Diagnosis not present

## 2023-10-19 MED ORDER — HEPARIN SOD (PORK) LOCK FLUSH 100 UNIT/ML IV SOLN
500.0000 [IU] | Freq: Once | INTRAVENOUS | Status: AC
  Administered 2023-10-19: 500 [IU]

## 2023-10-19 MED ORDER — SODIUM CHLORIDE 0.9% FLUSH
10.0000 mL | Freq: Once | INTRAVENOUS | Status: AC
  Administered 2023-10-19: 10 mL

## 2023-10-19 NOTE — Patient Instructions (Signed)
PICC Home Care Guide A peripherally inserted central catheter (PICC) is a form of IV access that allows medicines and IV fluids to be quickly put into the blood and spread throughout the body. The PICC is a long, thin, flexible tube (catheter) that is put into a vein in a person's arm or leg. The catheter ends in a large vein just outside the heart called the superior vena cava (SVC). After the PICC is put in, a chest X-ray may be done to make sure that it is in the right place. A PICC may be placed for different reasons, such as: To give medicines and liquid nutrition. To give IV fluids and blood products. To take blood samples often. If there is trouble placing a peripheral intravenous (PIV) catheter. If cared for properly, a PICC can remain in place for many months. Having a PICC can allow you to go home from the hospital sooner and continue treatment at home. Medicines and PICC care can be managed at home by a family member, caregiver, or home health care team. What are the risks? Generally, having a PICC is safe. However, problems may occur, including: A blood clot (thrombus) forming in or at the end of the PICC. A blood clot forming in a vein (deep vein thrombosis) or traveling to the lung (pulmonary embolism). Inflammation of the vein (phlebitis) in which the PICC is placed. Infection at the insertion site or in the blood. Blood infections from central lines, like PICCs, can be serious and often require a hospital stay. PICC malposition, or PICC movement or poor placement. A break or cut in the PICC. Do not use scissors near the PICC. Nerve or tendon irritation or injury during PICC insertion. How to care for your PICC Please follow the specific guidelines provided by your health care provider. Preventing infection You and any caregivers should wash your hands often with soap and water for at least 20 seconds. Wash hands: Before touching the PICC or the infusion device. Before changing a  bandage (dressing). Do not change the dressing unless you have been taught to do so and have shown you are able to change it safely. Flush the PICC as told. Tell your health care provider right away if the PICC is hard to flush or does not flush. Do not use force to flush the PICC. Use clean and germ-free (sterile) supplies only. Keep the supplies in a dry place. Do not reuse needles, syringes, or any other supplies. Reusing supplies can lead to infection. Keep the PICC dressing dry and secure it with tape if the edges stop sticking to your skin. Check your PICC insertion site every day for signs of infection. Check for: Redness, swelling, or pain. Fluid or blood. Warmth. Pus or a bad smell. Preventing other problems Do not use a syringe that is less than 10 mL to flush the PICC. Do not have your blood pressure checked on the arm in which the PICC is placed. Do not ever pull or tug on the PICC. Keep it secured to your arm with tape or a stretch wrap when not in use. Do not take the PICC out yourself. Only a trained health care provider should remove the PICC. Keep pets and children away from your PICC. How to care for your PICC dressing Keep your PICC dressing clean and dry to prevent infection. Do not take baths, swim, or use a hot tub until your health care provider approves. Ask your health care provider if you can take  showers. You may only be allowed to take sponge baths. When you are allowed to shower: Ask your health care provider to teach you how to wrap the PICC. Cover the PICC with clear plastic wrap and tape to keep it dry while showering. Follow instructions from your health care provider about how to take care of your insertion site and dressing. Make sure you: Wash your hands with soap and water for at least 20 seconds before and after you change your dressing. If soap and water are not available, use hand sanitizer. Change your dressing only if taught to do so by your health care  provider. Your PICC dressing needs to be changed if it becomes loose or wet. Leave stitches (sutures), skin glue, or adhesive strips in place. These skin closures may need to stay in place for 2 weeks or longer. If adhesive strip edges start to loosen and curl up, you may trim the loose edges. Do not remove adhesive strips completely unless your health care provider tells you to do that. Follow these instructions at home: Disposal of supplies Throw away any syringes in a disposal container that is meant for sharp items (sharps container). You can buy a sharps container from a pharmacy, or you can make one by using an empty, hard plastic bottle with a lid. Place any used dressings or infusion bags into a plastic bag. Throw that bag in the trash. General instructions  Always carry your PICC identification card or wear a medical alert bracelet. Keep the tube clamped at all times, unless it is being used. Always carry a smooth-edge clamp with you to clamp the PICC if it breaks. Do not use scissors or sharp objects near the tube. You may bend your arm and move it freely. If your PICC is near or at the bend of your elbow, avoid activity with repeated motion at the elbow. Avoid lifting heavy objects as told by your health care provider. Keep all follow-up visits. This is important. You will need to have your PICC dressing changed at least once a week. Contact a health care provider if: You have pain in your arm, ear, face, or teeth. You have a fever or chills. You have redness, swelling, or pain around the insertion site. You have fluid or blood coming from the insertion site. Your insertion site feels warm to the touch. You have pus or a bad smell coming from the insertion site. Your skin feels hard and raised around the insertion site. Your PICC dressing has gotten wet or is coming off and you have not been taught how to change it. Get help right away if: You have problems with your PICC, such as  your PICC: Was tugged or pulled and has partially come out. Do not  push the PICC back in. Cannot be flushed, is hard to flush, or leaks around the insertion site when it is flushed. Makes a flushing sound when it is flushed. Appears to have a hole or tear. Is accidentally pulled all the way out. If this happens, cover the insertion site with a gauze dressing. Do not throw the PICC away. Your health care provider will need to check it to be sure the entire catheter came out. You feel your heart racing or skipping beats, or you have chest pain. You have shortness of breath or trouble breathing. You have swelling, redness, warmth, or pain in the arm in which the PICC is placed. You have a red streak going up your arm that  starts under the PICC dressing. These symptoms may be an emergency. Get help right away. Call 911. Do not wait to see if the symptoms will go away. Do not drive yourself to the hospital. Summary A peripherally inserted central catheter (PICC) is a long, thin, flexible tube (catheter) that is put into a vein in the arm or leg. If cared for properly, a PICC can remain in place for many months. Having a PICC can allow you to go home from the hospital sooner and continue treatment at home. The PICC is inserted using a germ-free (sterile) technique by a specially trained health care provider. Only a trained health care provider should remove it. Do not have your blood pressure checked on the arm in which your PICC is placed. Always keep your PICC identification card with you. This information is not intended to replace advice given to you by your health care provider. Make sure you discuss any questions you have with your health care provider. Document Revised: 04/27/2021 Document Reviewed: 04/27/2021 Elsevier Patient Education  2024 [employer].

## 2023-10-21 DIAGNOSIS — J910 Malignant pleural effusion: Secondary | ICD-10-CM | POA: Diagnosis not present

## 2023-10-21 DIAGNOSIS — J9601 Acute respiratory failure with hypoxia: Secondary | ICD-10-CM | POA: Diagnosis not present

## 2023-10-22 DIAGNOSIS — C3491 Malignant neoplasm of unspecified part of right bronchus or lung: Secondary | ICD-10-CM | POA: Diagnosis not present

## 2023-10-22 DIAGNOSIS — I10 Essential (primary) hypertension: Secondary | ICD-10-CM | POA: Diagnosis not present

## 2023-10-23 ENCOUNTER — Ambulatory Visit (HOSPITAL_BASED_OUTPATIENT_CLINIC_OR_DEPARTMENT_OTHER)
Admission: RE | Admit: 2023-10-23 | Discharge: 2023-10-23 | Disposition: A | Discharge status: Home / Self Care | Payer: No Typology Code available for payment source | Source: Ambulatory Visit | Attending: Oncology | Admitting: Oncology

## 2023-10-23 DIAGNOSIS — J90 Pleural effusion, not elsewhere classified: Secondary | ICD-10-CM | POA: Diagnosis not present

## 2023-10-23 DIAGNOSIS — C3490 Malignant neoplasm of unspecified part of unspecified bronchus or lung: Secondary | ICD-10-CM | POA: Insufficient documentation

## 2023-10-23 DIAGNOSIS — K5900 Constipation, unspecified: Secondary | ICD-10-CM | POA: Diagnosis not present

## 2023-10-23 DIAGNOSIS — D509 Iron deficiency anemia, unspecified: Secondary | ICD-10-CM | POA: Diagnosis not present

## 2023-10-23 DIAGNOSIS — N1832 Chronic kidney disease, stage 3b: Secondary | ICD-10-CM | POA: Diagnosis not present

## 2023-10-23 DIAGNOSIS — I130 Hypertensive heart and chronic kidney disease with heart failure and stage 1 through stage 4 chronic kidney disease, or unspecified chronic kidney disease: Secondary | ICD-10-CM | POA: Diagnosis not present

## 2023-10-26 ENCOUNTER — Other Ambulatory Visit: Payer: No Typology Code available for payment source

## 2023-10-26 ENCOUNTER — Inpatient Hospital Stay: Payer: Medicare Other

## 2023-10-26 ENCOUNTER — Inpatient Hospital Stay: Payer: Medicare Other | Admitting: Nurse Practitioner

## 2023-10-26 ENCOUNTER — Other Ambulatory Visit: Payer: Self-pay

## 2023-10-26 DIAGNOSIS — C3490 Malignant neoplasm of unspecified part of unspecified bronchus or lung: Secondary | ICD-10-CM

## 2023-10-29 ENCOUNTER — Telehealth: Payer: Self-pay

## 2023-10-29 ENCOUNTER — Inpatient Hospital Stay: Payer: Medicare Other | Admitting: Nurse Practitioner

## 2023-10-29 ENCOUNTER — Inpatient Hospital Stay: Payer: Medicare Other

## 2023-10-29 ENCOUNTER — Telehealth: Payer: Self-pay | Admitting: Cardiology

## 2023-10-29 NOTE — Telephone Encounter (Signed)
-----   Message from Thornton Papas sent at 10/26/2023  4:19 PM EST ----- Please call patient, CT is stable, no evidence of progressive cancer, follow-up as scheduled

## 2023-10-29 NOTE — Telephone Encounter (Signed)
 Findings on CT scan are not really that much unexpected based on previous studies.  We can discuss results when I see her on 129.  No need to be seen sooner. Bryan Lemma, MD

## 2023-10-29 NOTE — Telephone Encounter (Signed)
 Patient gave verbal understanding and had no further questions or concerns

## 2023-10-29 NOTE — Telephone Encounter (Signed)
 Patient wants to obtain a copy of her CT Scan results. Please advise.

## 2023-10-29 NOTE — Telephone Encounter (Signed)
 Called and spoke to patient. Verified name and DOB. Patient would like Dr Anner to look at her CT chest w/o contrast that was done on 1/3 and let her know if she needs to come in sooner. Patient is scheduled for ov 1/29 for 6 month follow up. Please advise.

## 2023-10-30 NOTE — Telephone Encounter (Signed)
 Called and spoke to patient. Below message relayed per Dr Anner. Patient verbalized understanding and agree.   Findings on CT scan are not really that much unexpected based on previous studies.  We can discuss results when I see her on 129.  No need to be seen sooner. [name] Alm Anner, MD

## 2023-10-31 ENCOUNTER — Ambulatory Visit (HOSPITAL_COMMUNITY)
Admission: RE | Admit: 2023-10-31 | Discharge: 2023-10-31 | Disposition: A | Discharge status: Home / Self Care | Payer: Medicare Other | Source: Ambulatory Visit | Attending: Oncology | Admitting: Oncology

## 2023-10-31 ENCOUNTER — Encounter: Payer: Self-pay | Admitting: Oncology

## 2023-10-31 ENCOUNTER — Telehealth: Payer: Self-pay

## 2023-10-31 ENCOUNTER — Other Ambulatory Visit: Payer: Self-pay

## 2023-10-31 ENCOUNTER — Other Ambulatory Visit: Payer: Self-pay | Admitting: Oncology

## 2023-10-31 DIAGNOSIS — C3490 Malignant neoplasm of unspecified part of unspecified bronchus or lung: Secondary | ICD-10-CM

## 2023-10-31 MED ORDER — HEPARIN SOD (PORK) LOCK FLUSH 100 UNIT/ML IV SOLN
INTRAVENOUS | Status: AC
Start: 2023-10-31 — End: ?
  Filled 2023-10-31: qty 5

## 2023-10-31 MED ORDER — LIDOCAINE-EPINEPHRINE 1 %-1:100000 IJ SOLN
20.0000 mL | Freq: Once | INTRAMUSCULAR | Status: AC
  Administered 2023-10-31: 9 mL via INTRADERMAL

## 2023-10-31 MED ORDER — LIDOCAINE-EPINEPHRINE 1 %-1:100000 IJ SOLN
INTRAMUSCULAR | Status: AC
  Filled 2023-10-31: qty 1

## 2023-10-31 NOTE — Telephone Encounter (Signed)
 The patient called to report that her PICC line was dislodged while she was undressing. I contacted Interventional Radiology, and the patient is scheduled to arrive at [facility] IR at 2:45 PM. The patient has been informed of the time and location.

## 2023-10-31 NOTE — Telephone Encounter (Signed)
 Crystal [name] was somehow transferred to the nurse manager line this morning and left two messages about her PICC line coming out.  It looks like she is scheduled in IR to have her line replaced for her treatment tomorrow. Will route to RN and LPN supporting Olam and Dr. Cloretta. Andrea CHRISTELLA Plunk, RN

## 2023-10-31 NOTE — Procedures (Signed)
 Left DL brachial vein PICC placed. Length 50 cm. Tip SVC/RA junction. Medication used-1% lidocaine to skin/SQ tissue. EBL< 5 cc. Ok to use.

## 2023-11-01 ENCOUNTER — Inpatient Hospital Stay (HOSPITAL_BASED_OUTPATIENT_CLINIC_OR_DEPARTMENT_OTHER): Payer: Medicare Other | Admitting: Nurse Practitioner

## 2023-11-01 ENCOUNTER — Encounter: Payer: Self-pay | Admitting: Oncology

## 2023-11-01 ENCOUNTER — Inpatient Hospital Stay: Payer: Medicare Other | Attending: Oncology

## 2023-11-01 ENCOUNTER — Encounter: Payer: Self-pay | Admitting: Nurse Practitioner

## 2023-11-01 ENCOUNTER — Inpatient Hospital Stay: Payer: Medicare Other

## 2023-11-01 VITALS — BP 118/67 | HR 93 | Resp 18

## 2023-11-01 VITALS — BP 145/80 | HR 81 | Temp 97.9°F | Resp 18 | Ht 65.0 in | Wt 206.0 lb

## 2023-11-01 DIAGNOSIS — C342 Malignant neoplasm of middle lobe, bronchus or lung: Secondary | ICD-10-CM

## 2023-11-01 DIAGNOSIS — C3490 Malignant neoplasm of unspecified part of unspecified bronchus or lung: Secondary | ICD-10-CM

## 2023-11-01 DIAGNOSIS — Z79620 Long term (current) use of immunosuppressive biologic: Secondary | ICD-10-CM | POA: Diagnosis not present

## 2023-11-01 DIAGNOSIS — Z5112 Encounter for antineoplastic immunotherapy: Secondary | ICD-10-CM | POA: Insufficient documentation

## 2023-11-01 DIAGNOSIS — Z452 Encounter for adjustment and management of vascular access device: Secondary | ICD-10-CM | POA: Insufficient documentation

## 2023-11-01 LAB — CBC WITH DIFFERENTIAL (CANCER CENTER ONLY)
Abs Immature Granulocytes: 0.02 10*3/uL (ref 0.00–0.07)
Basophils Absolute: 0 10*3/uL (ref 0.0–0.1)
Basophils Relative: 0 %
Eosinophils Absolute: 0 10*3/uL (ref 0.0–0.5)
Eosinophils Relative: 0 %
HCT: 38 % (ref 36.0–46.0)
Hemoglobin: 12.3 g/dL (ref 12.0–15.0)
Immature Granulocytes: 0 %
Lymphocytes Relative: 36 %
Lymphs Abs: 2.1 10*3/uL (ref 0.7–4.0)
MCH: 30.9 pg (ref 26.0–34.0)
MCHC: 32.4 g/dL (ref 30.0–36.0)
MCV: 95.5 fL (ref 80.0–100.0)
Monocytes Absolute: 0.5 10*3/uL (ref 0.1–1.0)
Monocytes Relative: 9 %
Neutro Abs: 3.2 10*3/uL (ref 1.7–7.7)
Neutrophils Relative %: 55 %
Platelet Count: 200 10*3/uL (ref 150–400)
RBC: 3.98 MIL/uL (ref 3.87–5.11)
RDW: 14.8 % (ref 11.5–15.5)
WBC Count: 5.9 10*3/uL (ref 4.0–10.5)
nRBC: 0 % (ref 0.0–0.2)

## 2023-11-01 LAB — CMP (CANCER CENTER ONLY)
ALT: 13 U/L (ref 0–44)
AST: 18 U/L (ref 15–41)
Albumin: 3.6 g/dL (ref 3.5–5.0)
Alkaline Phosphatase: 106 U/L (ref 38–126)
Anion gap: 8 (ref 5–15)
BUN: 25 mg/dL — ABNORMAL HIGH (ref 8–23)
CO2: 27 mmol/L (ref 22–32)
Calcium: 8.9 mg/dL (ref 8.9–10.3)
Chloride: 107 mmol/L (ref 98–111)
Creatinine: 1.27 mg/dL — ABNORMAL HIGH (ref 0.44–1.00)
GFR, Estimated: 43 mL/min — ABNORMAL LOW (ref >60)
Glucose, Bld: 90 mg/dL (ref 70–99)
Potassium: 4.4 mmol/L (ref 3.5–5.1)
Sodium: 142 mmol/L (ref 135–145)
Total Bilirubin: 0.4 mg/dL (ref 0.0–1.2)
Total Protein: 6.8 g/dL (ref 6.5–8.1)

## 2023-11-01 MED ORDER — SODIUM CHLORIDE 0.9 % IV SOLN
1200.0000 mg | Freq: Once | INTRAVENOUS | Status: AC
  Administered 2023-11-01: 1200 mg via INTRAVENOUS
  Filled 2023-11-01: qty 20

## 2023-11-01 MED ORDER — HEPARIN SOD (PORK) LOCK FLUSH 100 UNIT/ML IV SOLN
250.0000 [IU] | Freq: Once | INTRAVENOUS | Status: AC | PRN
  Administered 2023-11-01: 250 [IU]

## 2023-11-01 MED ORDER — SODIUM CHLORIDE 0.9 % IV SOLN: Freq: Once | INTRAVENOUS | Status: AC

## 2023-11-01 MED ORDER — SODIUM CHLORIDE 0.9% FLUSH
3.0000 mL | INTRAVENOUS | Status: DC | PRN
  Administered 2023-11-01: 3 mL

## 2023-11-01 NOTE — Patient Instructions (Signed)
 [facility] - A DEPT OF [facility]   Discharge Instructions: Thank you for choosing [facility] to provide your oncology and hematology care.   If you have a lab appointment with the Cancer Center, please go directly to the Cancer Center and check in at the registration area.   Wear comfortable clothing and clothing appropriate for easy access to any Portacath or PICC line.   We strive to give you quality time with your provider. You may need to reschedule your appointment if you arrive late (15 or more minutes).  Arriving late affects you and other patients whose appointments are after yours.  Also, if you miss three or more appointments without notifying the office, you may be dismissed from the clinic at the provider's discretion.      For prescription refill requests, have your pharmacy contact our office and allow 72 hours for refills to be completed.    Today you received the following chemotherapy and/or immunotherapy agents Atezolizumab  (TECENTRIQ ).      To help prevent nausea and vomiting after your treatment, we encourage you to take your nausea medication as directed.  BELOW ARE SYMPTOMS THAT SHOULD BE REPORTED IMMEDIATELY: *FEVER GREATER THAN 100.4 F (38 C) OR HIGHER *CHILLS OR SWEATING *NAUSEA AND VOMITING THAT IS NOT CONTROLLED WITH YOUR NAUSEA MEDICATION *UNUSUAL SHORTNESS OF BREATH *UNUSUAL BRUISING OR BLEEDING *URINARY PROBLEMS (pain or burning when urinating, or frequent urination) *BOWEL PROBLEMS (unusual diarrhea, constipation, pain near the anus) TENDERNESS IN MOUTH AND THROAT WITH OR WITHOUT PRESENCE OF ULCERS (sore throat, sores in mouth, or a toothache) UNUSUAL RASH, SWELLING OR PAIN  UNUSUAL VAGINAL DISCHARGE OR ITCHING   Items with * indicate a potential emergency and should be followed up as soon as possible or go to the Emergency Department if any problems should occur.  Please show the CHEMOTHERAPY ALERT CARD  or IMMUNOTHERAPY ALERT CARD at check-in to the Emergency Department and triage nurse.  Should you have questions after your visit or need to cancel or reschedule your appointment, please contact [facility] - A DEPT OF [facility]  Dept: [phone]  and follow the prompts.  Office hours are 8:00 a.m. to 4:30 p.m. Monday - Friday. Please note that voicemails left after 4:00 p.m. may not be returned until the following business day.  We are closed weekends and major holidays. You have access to a nurse at all times for urgent questions. Please call the main number to the clinic Dept: [phone] and follow the prompts.   For any non-urgent questions, you may also contact your provider using MyChart. We now offer e-Visits for anyone 57 and older to request care online for non-urgent symptoms. For details visit [email or web address].   Also download the MyChart app! Go to the app store, search MyChart, open the app, select [facility], and log in with your MyChart username and password.  Atezolizumab  Injection What is this medication? ATEZOLIZUMAB  (a te zoe LIZ ue mab) treats some types of cancer. It works by helping your immune system slow or stop the spread of cancer cells. It is a monoclonal antibody. This medicine may be used for other purposes; ask your health care provider or pharmacist if you have questions. COMMON BRAND NAME(S): Tecentriq  What should I tell my care team before I take this medication? They need to know if you have any of these conditions: Allogeneic stem cell transplant (uses someone else's stem cells) Autoimmune  diseases, such as Crohn disease, ulcerative colitis, lupus History of chest radiation Nervous system problems, such as Guillain-Barre syndrome, myasthenia gravis Organ transplant An unusual or allergic reaction to atezolizumab , other medications, foods, dyes, or preservatives Pregnant or trying to get  pregnant Breast-feeding How should I use this medication? This medication is injected into a vein. It is given by your care team in a hospital or clinic setting. A special MedGuide will be given to you before each treatment. Be sure to read this information carefully each time. Talk to your care team about the use of this medication in children. While it may be prescribed for children as young as 2 years for selected conditions, precautions do apply. Overdosage: If you think you have taken too much of this medicine contact a poison control center or emergency room at once. NOTE: This medicine is only for you. Do not share this medicine with others. What if I miss a dose? Keep appointments for follow-up doses. It is important not to miss your dose. Call your care team if you are unable to keep an appointment. What may interact with this medication? Interactions have not been studied. This list may not describe all possible interactions. Give your health care provider a list of all the medicines, herbs, non-prescription drugs, or dietary supplements you use. Also tell them if you smoke, drink alcohol , or use illegal drugs. Some items may interact with your medicine. What should I watch for while using this medication? Your condition will be monitored carefully while you are receiving this medication. You may need blood work while taking this medication. This medication may cause serious skin reactions. They can happen weeks to months after starting the medication. Contact your care team right away if you notice fevers or flu-like symptoms with a rash. The rash may be red or purple and then turn into blisters or peeling of the skin. You may also notice a red rash with swelling of the face, lips, or lymph nodes in your neck or under your arms. Tell your care team right away if you have any change in your eyesight. Talk to your care team if you may be pregnant. Serious birth defects can occur if you take  this medication during pregnancy and for 5 months after the last dose. You will need a negative pregnancy test before starting this medication. Contraception is recommended while taking this medication and for 5 months after the last dose. Your care team can help you find the option that works for you. Do not breastfeed while taking this medication and for at least 5 months after the last dose. What side effects may I notice from receiving this medication? Side effects that you should report to your doctor or health care professional as soon as possible: Allergic reactions--skin rash, itching, hives, swelling of the face, lips, tongue, or throat Dry cough, shortness of breath or trouble breathing Eye pain, redness, irritation, or discharge with blurry or decreased vision Heart muscle inflammation--unusual weakness or fatigue, shortness of breath, chest pain, fast or irregular heartbeat, dizziness, swelling of the ankles, feet, or hands Hormone gland problems--headache, sensitivity to light, unusual weakness or fatigue, dizziness, fast or irregular heartbeat, increased sensitivity to cold or heat, excessive sweating, constipation, hair loss, increased thirst or amount of urine, tremors or shaking, irritability Infusion reactions--chest pain, shortness of breath or trouble breathing, feeling faint or lightheaded Kidney injury (glomerulonephritis)--decrease in the amount of urine, red or dark brown urine, foamy or bubbly urine, swelling of the  ankles, hands, or feet Liver injury--right upper belly pain, loss of appetite, nausea, light-colored stool, dark yellow or brown urine, yellowing skin or eyes, unusual weakness or fatigue Pain, tingling, or numbness in the hands or feet, muscle weakness, change in vision, confusion or trouble speaking, loss of balance or coordination, trouble walking, seizures Rash, fever, and swollen lymph nodes Redness, blistering, peeling, or loosening of the skin, including  inside the mouth Sudden or severe stomach pain, bloody diarrhea, fever, nausea, vomiting Side effects that usually do not require medical attention (report to your doctor or health care professional if they continue or are bothersome): Bone, joint, or muscle pain Diarrhea Fatigue Loss of appetite Nausea Skin rash This list may not describe all possible side effects. Call your doctor for medical advice about side effects. You may report side effects to FDA at 1-800-FDA-1088. Where should I keep my medication? This medication is given in a hospital or clinic. It will not be stored at home. NOTE: This sheet is a summary. It may not cover all possible information. If you have questions about this medicine, talk to your doctor, pharmacist, or health care provider.  2024 Elsevier/Gold Standard (2022-02-24 00:00:00)
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
## 2023-11-01 NOTE — Progress Notes (Signed)
 Patient seen by Olam Ned NP today  Vitals are within treatment parameters:Yes   Labs are within treatment parameters: Yes creatinine 1.27  Treatment plan has been signed: Yes   Per physician team, Patient is ready for treatment and there are NO modifications to the treatment plan.

## 2023-11-02 ENCOUNTER — Encounter: Payer: Self-pay | Admitting: Oncology

## 2023-11-02 ENCOUNTER — Other Ambulatory Visit: Payer: Self-pay

## 2023-11-05 ENCOUNTER — Encounter: Payer: Self-pay | Admitting: Oncology

## 2023-11-05 ENCOUNTER — Other Ambulatory Visit: Payer: Self-pay

## 2023-11-09 ENCOUNTER — Inpatient Hospital Stay: Payer: Medicare Other

## 2023-11-09 DIAGNOSIS — Z452 Encounter for adjustment and management of vascular access device: Secondary | ICD-10-CM

## 2023-11-09 DIAGNOSIS — Z5112 Encounter for antineoplastic immunotherapy: Secondary | ICD-10-CM | POA: Diagnosis not present

## 2023-11-09 MED ORDER — HEPARIN SOD (PORK) LOCK FLUSH 100 UNIT/ML IV SOLN
250.0000 [IU] | Freq: Once | INTRAVENOUS | Status: AC
  Administered 2023-11-09: 250 [IU] via INTRAVENOUS

## 2023-11-09 MED ORDER — SODIUM CHLORIDE 0.9% FLUSH
10.0000 mL | Freq: Once | INTRAVENOUS | Status: AC
  Administered 2023-11-09: 10 mL via INTRAVENOUS

## 2023-11-09 NOTE — Patient Instructions (Signed)
PICC Home Care Guide A peripherally inserted central catheter (PICC) is a form of IV access that allows medicines and IV fluids to be quickly put into the blood and spread throughout the body. The PICC is a long, thin, flexible tube (catheter) that is put into a vein in a person's arm or leg. The catheter ends in a large vein just outside the heart called the superior vena cava (SVC). After the PICC is put in, a chest X-ray may be done to make sure that it is in the right place. A PICC may be placed for different reasons, such as: To give medicines and liquid nutrition. To give IV fluids and blood products. To take blood samples often. If there is trouble placing a peripheral intravenous (PIV) catheter. If cared for properly, a PICC can remain in place for many months. Having a PICC can allow you to go home from the hospital sooner and continue treatment at home. Medicines and PICC care can be managed at home by a family member, caregiver, or home health care team. What are the risks? Generally, having a PICC is safe. However, problems may occur, including: A blood clot (thrombus) forming in or at the end of the PICC. A blood clot forming in a vein (deep vein thrombosis) or traveling to the lung (pulmonary embolism). Inflammation of the vein (phlebitis) in which the PICC is placed. Infection at the insertion site or in the blood. Blood infections from central lines, like PICCs, can be serious and often require a hospital stay. PICC malposition, or PICC movement or poor placement. A break or cut in the PICC. Do not use scissors near the PICC. Nerve or tendon irritation or injury during PICC insertion. How to care for your PICC Please follow the specific guidelines provided by your health care provider. Preventing infection You and any caregivers should wash your hands often with soap and water for at least 20 seconds. Wash hands: Before touching the PICC or the infusion device. Before changing a  bandage (dressing). Do not change the dressing unless you have been taught to do so and have shown you are able to change it safely. Flush the PICC as told. Tell your health care provider right away if the PICC is hard to flush or does not flush. Do not use force to flush the PICC. Use clean and germ-free (sterile) supplies only. Keep the supplies in a dry place. Do not reuse needles, syringes, or any other supplies. Reusing supplies can lead to infection. Keep the PICC dressing dry and secure it with tape if the edges stop sticking to your skin. Check your PICC insertion site every day for signs of infection. Check for: Redness, swelling, or pain. Fluid or blood. Warmth. Pus or a bad smell. Preventing other problems Do not use a syringe that is less than 10 mL to flush the PICC. Do not have your blood pressure checked on the arm in which the PICC is placed. Do not ever pull or tug on the PICC. Keep it secured to your arm with tape or a stretch wrap when not in use. Do not take the PICC out yourself. Only a trained health care provider should remove the PICC. Keep pets and children away from your PICC. How to care for your PICC dressing Keep your PICC dressing clean and dry to prevent infection. Do not take baths, swim, or use a hot tub until your health care provider approves. Ask your health care provider if you can take  showers. You may only be allowed to take sponge baths. When you are allowed to shower: Ask your health care provider to teach you how to wrap the PICC. Cover the PICC with clear plastic wrap and tape to keep it dry while showering. Follow instructions from your health care provider about how to take care of your insertion site and dressing. Make sure you: Wash your hands with soap and water for at least 20 seconds before and after you change your dressing. If soap and water are not available, use hand sanitizer. Change your dressing only if taught to do so by your health care  provider. Your PICC dressing needs to be changed if it becomes loose or wet. Leave stitches (sutures), skin glue, or adhesive strips in place. These skin closures may need to stay in place for 2 weeks or longer. If adhesive strip edges start to loosen and curl up, you may trim the loose edges. Do not remove adhesive strips completely unless your health care provider tells you to do that. Follow these instructions at home: Disposal of supplies Throw away any syringes in a disposal container that is meant for sharp items (sharps container). You can buy a sharps container from a pharmacy, or you can make one by using an empty, hard plastic bottle with a lid. Place any used dressings or infusion bags into a plastic bag. Throw that bag in the trash. General instructions  Always carry your PICC identification card or wear a medical alert bracelet. Keep the tube clamped at all times, unless it is being used. Always carry a smooth-edge clamp with you to clamp the PICC if it breaks. Do not use scissors or sharp objects near the tube. You may bend your arm and move it freely. If your PICC is near or at the bend of your elbow, avoid activity with repeated motion at the elbow. Avoid lifting heavy objects as told by your health care provider. Keep all follow-up visits. This is important. You will need to have your PICC dressing changed at least once a week. Contact a health care provider if: You have pain in your arm, ear, face, or teeth. You have a fever or chills. You have redness, swelling, or pain around the insertion site. You have fluid or blood coming from the insertion site. Your insertion site feels warm to the touch. You have pus or a bad smell coming from the insertion site. Your skin feels hard and raised around the insertion site. Your PICC dressing has gotten wet or is coming off and you have not been taught how to change it. Get help right away if: You have problems with your PICC, such as  your PICC: Was tugged or pulled and has partially come out. Do not  push the PICC back in. Cannot be flushed, is hard to flush, or leaks around the insertion site when it is flushed. Makes a flushing sound when it is flushed. Appears to have a hole or tear. Is accidentally pulled all the way out. If this happens, cover the insertion site with a gauze dressing. Do not throw the PICC away. Your health care provider will need to check it to be sure the entire catheter came out. You feel your heart racing or skipping beats, or you have chest pain. You have shortness of breath or trouble breathing. You have swelling, redness, warmth, or pain in the arm in which the PICC is placed. You have a red streak going up your arm that  starts under the PICC dressing. These symptoms may be an emergency. Get help right away. Call 911. Do not wait to see if the symptoms will go away. Do not drive yourself to the hospital. Summary A peripherally inserted central catheter (PICC) is a long, thin, flexible tube (catheter) that is put into a vein in the arm or leg. If cared for properly, a PICC can remain in place for many months. Having a PICC can allow you to go home from the hospital sooner and continue treatment at home. The PICC is inserted using a germ-free (sterile) technique by a specially trained health care provider. Only a trained health care provider should remove it. Do not have your blood pressure checked on the arm in which your PICC is placed. Always keep your PICC identification card with you. This information is not intended to replace advice given to you by your health care provider. Make sure you discuss any questions you have with your health care provider. Document Revised: 04/27/2021 Document Reviewed: 04/27/2021 Elsevier Patient Education  2024 [employer].

## 2023-11-13 ENCOUNTER — Other Ambulatory Visit: Payer: Self-pay | Admitting: Urology

## 2023-11-14 ENCOUNTER — Ambulatory Visit (HOSPITAL_COMMUNITY): Payer: 59

## 2023-11-14 ENCOUNTER — Other Ambulatory Visit (HOSPITAL_COMMUNITY): Payer: 59

## 2023-11-16 ENCOUNTER — Inpatient Hospital Stay: Payer: Medicare Other

## 2023-11-16 VITALS — BP 98/58 | HR 91 | Temp 98.2°F | Resp 18

## 2023-11-16 DIAGNOSIS — Z452 Encounter for adjustment and management of vascular access device: Secondary | ICD-10-CM

## 2023-11-16 DIAGNOSIS — Z5112 Encounter for antineoplastic immunotherapy: Secondary | ICD-10-CM | POA: Diagnosis not present

## 2023-11-16 MED ORDER — SODIUM CHLORIDE 0.9% FLUSH
10.0000 mL | Freq: Once | INTRAVENOUS | Status: AC
  Administered 2023-11-16: 10 mL

## 2023-11-16 MED ORDER — HEPARIN SOD (PORK) LOCK FLUSH 100 UNIT/ML IV SOLN
500.0000 [IU] | Freq: Once | INTRAVENOUS | Status: AC
  Administered 2023-11-16: 500 [IU]

## 2023-11-16 NOTE — Patient Instructions (Signed)
PICC Home Care Guide A peripherally inserted central catheter (PICC) is a form of IV access that allows medicines and IV fluids to be quickly put into the blood and spread throughout the body. The PICC is a long, thin, flexible tube (catheter) that is put into a vein in a person's arm or leg. The catheter ends in a large vein just outside the heart called the superior vena cava (SVC). After the PICC is put in, a chest X-ray may be done to make sure that it is in the right place. A PICC may be placed for different reasons, such as: To give medicines and liquid nutrition. To give IV fluids and blood products. To take blood samples often. If there is trouble placing a peripheral intravenous (PIV) catheter. If cared for properly, a PICC can remain in place for many months. Having a PICC can allow you to go home from the hospital sooner and continue treatment at home. Medicines and PICC care can be managed at home by a family member, caregiver, or home health care team. What are the risks? Generally, having a PICC is safe. However, problems may occur, including: A blood clot (thrombus) forming in or at the end of the PICC. A blood clot forming in a vein (deep vein thrombosis) or traveling to the lung (pulmonary embolism). Inflammation of the vein (phlebitis) in which the PICC is placed. Infection at the insertion site or in the blood. Blood infections from central lines, like PICCs, can be serious and often require a hospital stay. PICC malposition, or PICC movement or poor placement. A break or cut in the PICC. Do not use scissors near the PICC. Nerve or tendon irritation or injury during PICC insertion. How to care for your PICC Please follow the specific guidelines provided by your health care provider. Preventing infection You and any caregivers should wash your hands often with soap and water for at least 20 seconds. Wash hands: Before touching the PICC or the infusion device. Before changing a  bandage (dressing). Do not change the dressing unless you have been taught to do so and have shown you are able to change it safely. Flush the PICC as told. Tell your health care provider right away if the PICC is hard to flush or does not flush. Do not use force to flush the PICC. Use clean and germ-free (sterile) supplies only. Keep the supplies in a dry place. Do not reuse needles, syringes, or any other supplies. Reusing supplies can lead to infection. Keep the PICC dressing dry and secure it with tape if the edges stop sticking to your skin. Check your PICC insertion site every day for signs of infection. Check for: Redness, swelling, or pain. Fluid or blood. Warmth. Pus or a bad smell. Preventing other problems Do not use a syringe that is less than 10 mL to flush the PICC. Do not have your blood pressure checked on the arm in which the PICC is placed. Do not ever pull or tug on the PICC. Keep it secured to your arm with tape or a stretch wrap when not in use. Do not take the PICC out yourself. Only a trained health care provider should remove the PICC. Keep pets and children away from your PICC. How to care for your PICC dressing Keep your PICC dressing clean and dry to prevent infection. Do not take baths, swim, or use a hot tub until your health care provider approves. Ask your health care provider if you can take  showers. You may only be allowed to take sponge baths. When you are allowed to shower: Ask your health care provider to teach you how to wrap the PICC. Cover the PICC with clear plastic wrap and tape to keep it dry while showering. Follow instructions from your health care provider about how to take care of your insertion site and dressing. Make sure you: Wash your hands with soap and water for at least 20 seconds before and after you change your dressing. If soap and water are not available, use hand sanitizer. Change your dressing only if taught to do so by your health care  provider. Your PICC dressing needs to be changed if it becomes loose or wet. Leave stitches (sutures), skin glue, or adhesive strips in place. These skin closures may need to stay in place for 2 weeks or longer. If adhesive strip edges start to loosen and curl up, you may trim the loose edges. Do not remove adhesive strips completely unless your health care provider tells you to do that. Follow these instructions at home: Disposal of supplies Throw away any syringes in a disposal container that is meant for sharp items (sharps container). You can buy a sharps container from a pharmacy, or you can make one by using an empty, hard plastic bottle with a lid. Place any used dressings or infusion bags into a plastic bag. Throw that bag in the trash. General instructions  Always carry your PICC identification card or wear a medical alert bracelet. Keep the tube clamped at all times, unless it is being used. Always carry a smooth-edge clamp with you to clamp the PICC if it breaks. Do not use scissors or sharp objects near the tube. You may bend your arm and move it freely. If your PICC is near or at the bend of your elbow, avoid activity with repeated motion at the elbow. Avoid lifting heavy objects as told by your health care provider. Keep all follow-up visits. This is important. You will need to have your PICC dressing changed at least once a week. Contact a health care provider if: You have pain in your arm, ear, face, or teeth. You have a fever or chills. You have redness, swelling, or pain around the insertion site. You have fluid or blood coming from the insertion site. Your insertion site feels warm to the touch. You have pus or a bad smell coming from the insertion site. Your skin feels hard and raised around the insertion site. Your PICC dressing has gotten wet or is coming off and you have not been taught how to change it. Get help right away if: You have problems with your PICC, such as  your PICC: Was tugged or pulled and has partially come out. Do not  push the PICC back in. Cannot be flushed, is hard to flush, or leaks around the insertion site when it is flushed. Makes a flushing sound when it is flushed. Appears to have a hole or tear. Is accidentally pulled all the way out. If this happens, cover the insertion site with a gauze dressing. Do not throw the PICC away. Your health care provider will need to check it to be sure the entire catheter came out. You feel your heart racing or skipping beats, or you have chest pain. You have shortness of breath or trouble breathing. You have swelling, redness, warmth, or pain in the arm in which the PICC is placed. You have a red streak going up your arm that  starts under the PICC dressing. These symptoms may be an emergency. Get help right away. Call 911. Do not wait to see if the symptoms will go away. Do not drive yourself to the hospital. Summary A peripherally inserted central catheter (PICC) is a long, thin, flexible tube (catheter) that is put into a vein in the arm or leg. If cared for properly, a PICC can remain in place for many months. Having a PICC can allow you to go home from the hospital sooner and continue treatment at home. The PICC is inserted using a germ-free (sterile) technique by a specially trained health care provider. Only a trained health care provider should remove it. Do not have your blood pressure checked on the arm in which your PICC is placed. Always keep your PICC identification card with you. This information is not intended to replace advice given to you by your health care provider. Make sure you discuss any questions you have with your health care provider. Document Revised: 04/27/2021 Document Reviewed: 04/27/2021 Elsevier Patient Education  2024 [employer].

## 2023-11-17 ENCOUNTER — Other Ambulatory Visit: Payer: Self-pay

## 2023-11-18 ENCOUNTER — Other Ambulatory Visit: Payer: Self-pay | Admitting: Oncology

## 2023-11-21 ENCOUNTER — Encounter: Payer: Self-pay | Admitting: Cardiology

## 2023-11-21 ENCOUNTER — Ambulatory Visit: Payer: Medicare Other | Attending: Cardiology | Admitting: Cardiology

## 2023-11-21 VITALS — BP 127/81 | HR 88 | Ht 65.0 in | Wt 206.0 lb

## 2023-11-21 DIAGNOSIS — I2782 Chronic pulmonary embolism: Secondary | ICD-10-CM

## 2023-11-21 DIAGNOSIS — E785 Hyperlipidemia, unspecified: Secondary | ICD-10-CM | POA: Diagnosis not present

## 2023-11-21 DIAGNOSIS — I428 Other cardiomyopathies: Secondary | ICD-10-CM | POA: Diagnosis not present

## 2023-11-21 DIAGNOSIS — R0609 Other forms of dyspnea: Secondary | ICD-10-CM

## 2023-11-21 DIAGNOSIS — E669 Obesity, unspecified: Secondary | ICD-10-CM | POA: Diagnosis not present

## 2023-11-21 DIAGNOSIS — I10 Essential (primary) hypertension: Secondary | ICD-10-CM | POA: Diagnosis not present

## 2023-11-21 DIAGNOSIS — C3490 Malignant neoplasm of unspecified part of unspecified bronchus or lung: Secondary | ICD-10-CM

## 2023-11-21 DIAGNOSIS — I493 Ventricular premature depolarization: Secondary | ICD-10-CM

## 2023-11-21 DIAGNOSIS — I2510 Atherosclerotic heart disease of native coronary artery without angina pectoris: Secondary | ICD-10-CM

## 2023-11-21 DIAGNOSIS — I5032 Chronic diastolic (congestive) heart failure: Secondary | ICD-10-CM

## 2023-11-21 DIAGNOSIS — R42 Dizziness and giddiness: Secondary | ICD-10-CM

## 2023-11-21 DIAGNOSIS — M4807 Spinal stenosis, lumbosacral region: Secondary | ICD-10-CM

## 2023-11-21 DIAGNOSIS — R55 Syncope and collapse: Secondary | ICD-10-CM
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
## 2023-11-21 NOTE — Patient Instructions (Addendum)
Medication Instructions:   NO CHANGES  *If you need a refill on your cardiac medications before your next appointment, please call your pharmacy*   Lab Work: NOT NEEDED    Testing/Procedures: NOT NEEDED   Follow-Up: At [facility], you and your health needs are our priority.  As part of our continuing mission to provide you with exceptional heart care, we have created designated Provider Care Teams.  These Care Teams include your primary Cardiologist (physician) and Advanced Practice Providers (APPs -  Physician Assistants and Nurse Practitioners) who all work together to provide you with the care you need, when you need it.     Your next appointment:   12 month(s)  The format for your next appointment:   In Person  Provider:   Bryan Lemma, MD    Other Instruction

## 2023-11-22 ENCOUNTER — Inpatient Hospital Stay: Payer: Medicare Other | Admitting: Nurse Practitioner

## 2023-11-22 ENCOUNTER — Telehealth: Payer: Self-pay | Admitting: Nurse Practitioner

## 2023-11-22 ENCOUNTER — Encounter: Payer: Self-pay | Admitting: Nurse Practitioner

## 2023-11-22 ENCOUNTER — Inpatient Hospital Stay: Payer: Medicare Other

## 2023-11-22 ENCOUNTER — Other Ambulatory Visit: Payer: Self-pay | Admitting: Nurse Practitioner

## 2023-11-22 VITALS — BP 112/94 | HR 91 | Temp 98.1°F | Resp 18 | Ht 65.0 in | Wt 207.0 lb

## 2023-11-22 DIAGNOSIS — C3490 Malignant neoplasm of unspecified part of unspecified bronchus or lung: Secondary | ICD-10-CM

## 2023-11-22 DIAGNOSIS — C342 Malignant neoplasm of middle lobe, bronchus or lung: Secondary | ICD-10-CM

## 2023-11-22 DIAGNOSIS — I2510 Atherosclerotic heart disease of native coronary artery without angina pectoris: Secondary | ICD-10-CM | POA: Insufficient documentation

## 2023-11-22 DIAGNOSIS — M4800 Spinal stenosis, site unspecified: Secondary | ICD-10-CM | POA: Insufficient documentation

## 2023-11-22 DIAGNOSIS — Z5112 Encounter for antineoplastic immunotherapy: Secondary | ICD-10-CM | POA: Diagnosis not present

## 2023-11-22 LAB — CBC WITH DIFFERENTIAL (CANCER CENTER ONLY)
Abs Immature Granulocytes: 0.01 10*3/uL (ref 0.00–0.07)
Basophils Absolute: 0 10*3/uL (ref 0.0–0.1)
Basophils Relative: 0 %
Eosinophils Absolute: 0 10*3/uL (ref 0.0–0.5)
Eosinophils Relative: 0 %
HCT: 37 % (ref 36.0–46.0)
Hemoglobin: 11.8 g/dL — ABNORMAL LOW (ref 12.0–15.0)
Immature Granulocytes: 0 %
Lymphocytes Relative: 38 %
Lymphs Abs: 1.9 10*3/uL (ref 0.7–4.0)
MCH: 30.9 pg (ref 26.0–34.0)
MCHC: 31.9 g/dL (ref 30.0–36.0)
MCV: 96.9 fL (ref 80.0–100.0)
Monocytes Absolute: 0.4 10*3/uL (ref 0.1–1.0)
Monocytes Relative: 8 %
Neutro Abs: 2.7 10*3/uL (ref 1.7–7.7)
Neutrophils Relative %: 54 %
Platelet Count: 191 10*3/uL (ref 150–400)
RBC: 3.82 MIL/uL — ABNORMAL LOW (ref 3.87–5.11)
RDW: 15 % (ref 11.5–15.5)
WBC Count: 5 10*3/uL (ref 4.0–10.5)
nRBC: 0 % (ref 0.0–0.2)

## 2023-11-22 LAB — CMP (CANCER CENTER ONLY)
ALT: 10 U/L (ref 0–44)
AST: 15 U/L (ref 15–41)
Albumin: 3.4 g/dL — ABNORMAL LOW (ref 3.5–5.0)
Alkaline Phosphatase: 93 U/L (ref 38–126)
Anion gap: 6 (ref 5–15)
BUN: 26 mg/dL — ABNORMAL HIGH (ref 8–23)
CO2: 27 mmol/L (ref 22–32)
Calcium: 9 mg/dL (ref 8.9–10.3)
Chloride: 107 mmol/L (ref 98–111)
Creatinine: 1.27 mg/dL — ABNORMAL HIGH (ref 0.44–1.00)
GFR, Estimated: 43 mL/min — ABNORMAL LOW (ref >60)
Glucose, Bld: 94 mg/dL (ref 70–99)
Potassium: 3.9 mmol/L (ref 3.5–5.1)
Sodium: 140 mmol/L (ref 135–145)
Total Bilirubin: 0.4 mg/dL (ref 0.0–1.2)
Total Protein: 6.8 g/dL (ref 6.5–8.1)

## 2023-11-22 MED ORDER — HEPARIN SOD (PORK) LOCK FLUSH 100 UNIT/ML IV SOLN
250.0000 [IU] | Freq: Once | INTRAVENOUS | Status: AC | PRN
  Administered 2023-11-22: 250 [IU]

## 2023-11-22 MED ORDER — SODIUM CHLORIDE 0.9% FLUSH
3.0000 mL | INTRAVENOUS | Status: DC | PRN
  Administered 2023-11-22: 3 mL

## 2023-11-22 MED ORDER — SODIUM CHLORIDE 0.9% FLUSH
10.0000 mL | INTRAVENOUS | Status: DC | PRN
  Administered 2023-11-22: 10 mL

## 2023-11-22 MED ORDER — HEPARIN SOD (PORK) LOCK FLUSH 100 UNIT/ML IV SOLN
500.0000 [IU] | Freq: Once | INTRAVENOUS | Status: AC | PRN
  Administered 2023-11-22: 250 [IU]

## 2023-11-22 MED ORDER — SODIUM CHLORIDE 0.9 % IV SOLN
1680.0000 mg | Freq: Once | INTRAVENOUS | Status: AC
  Administered 2023-11-22: 1680 mg via INTRAVENOUS
  Filled 2023-11-22: qty 28

## 2023-11-22 MED ORDER — SODIUM CHLORIDE 0.9 % IV SOLN: Freq: Once | INTRAVENOUS | Status: AC

## 2023-11-22 NOTE — Assessment & Plan Note (Signed)
Undergoing immunotherapy. No new symptoms related to cancer reported. -Continue current cancer treatment as directed by oncologist.

## 2023-11-22 NOTE — Assessment & Plan Note (Deleted)
Has had recurrent PEs now and is on lifelong anticoagulation with DOAC:. On Eliquis due to DVT and PE.  No recent falls or bleeding events reported. -Continue Eliquis as prescribed.  Okay to hold Eliquis 2 to 3 days for procedures or surgeries.

## 2023-11-22 NOTE — Patient Instructions (Addendum)
[facility] - A DEPT OF [facility]   Discharge Instructions: Thank you for choosing [facility] to provide your oncology and hematology care.   If you have a lab appointment with the Cancer Center, please go directly to the Cancer Center and check in at the registration area.   Wear comfortable clothing and clothing appropriate for easy access to any Portacath or PICC line.   We strive to give you quality time with your provider. You may need to reschedule your appointment if you arrive late (15 or more minutes).  Arriving late affects you and other patients whose appointments are after yours.  Also, if you miss three or more appointments without notifying the office, you may be dismissed from the clinic at the provider's discretion.      For prescription refill requests, have your pharmacy contact our office and allow 72 hours for refills to be completed.    Today you received the following chemotherapy and/or immunotherapy agent: Atezolizumab (TECENTRIQ).      To help prevent nausea and vomiting after your treatment, we encourage you to take your nausea medication as directed.  BELOW ARE SYMPTOMS THAT SHOULD BE REPORTED IMMEDIATELY: *FEVER GREATER THAN 100.4 F (38 C) OR HIGHER *CHILLS OR SWEATING *NAUSEA AND VOMITING THAT IS NOT CONTROLLED WITH YOUR NAUSEA MEDICATION *UNUSUAL SHORTNESS OF BREATH *UNUSUAL BRUISING OR BLEEDING *URINARY PROBLEMS (pain or burning when urinating, or frequent urination) *BOWEL PROBLEMS (unusual diarrhea, constipation, pain near the anus) TENDERNESS IN MOUTH AND THROAT WITH OR WITHOUT PRESENCE OF ULCERS (sore throat, sores in mouth, or a toothache) UNUSUAL RASH, SWELLING OR PAIN  UNUSUAL VAGINAL DISCHARGE OR ITCHING   Items with * indicate a potential emergency and should be followed up as soon as possible or go to the Emergency Department if any problems should occur.  Please show the CHEMOTHERAPY ALERT CARD  or IMMUNOTHERAPY ALERT CARD at check-in to the Emergency Department and triage nurse.  Should you have questions after your visit or need to cancel or reschedule your appointment, please contact [facility] - A DEPT OF [facility]  Dept: [phone]  and follow the prompts.  Office hours are 8:00 a.m. to 4:30 p.m. Monday - Friday. Please note that voicemails left after 4:00 p.m. may not be returned until the following business day.  We are closed weekends and major holidays. You have access to a nurse at all times for urgent questions. Please call the main number to the clinic Dept: [phone] and follow the prompts.   For any non-urgent questions, you may also contact your provider using MyChart. We now offer e-Visits for anyone 92 and older to request care online for non-urgent symptoms. For details visit [email or web address].   Also download the MyChart app! Go to the app store, search "MyChart", open the app, select [facility], and log in with your MyChart username and password.  Atezolizumab Injection What is this medication? ATEZOLIZUMAB (a te zoe LIZ ue mab) treats some types of cancer. It works by helping your immune system slow or stop the spread of cancer cells. It is a monoclonal antibody. This medicine may be used for other purposes; ask your health care provider or pharmacist if you have questions. COMMON BRAND NAME(S): Tecentriq What should I tell my care team before I take this medication? They need to know if you have any of these conditions: Allogeneic stem cell transplant (uses someone else's stem cells) Autoimmune  diseases, such as Crohn disease, ulcerative colitis, lupus History of chest radiation Nervous system problems, such as Guillain-Barre syndrome, myasthenia gravis Organ transplant An unusual or allergic reaction to atezolizumab, other medications, foods, dyes, or preservatives Pregnant or trying to get  pregnant Breast-feeding How should I use this medication? This medication is injected into a vein. It is given by your care team in a hospital or clinic setting. A special MedGuide will be given to you before each treatment. Be sure to read this information carefully each time. Talk to your care team about the use of this medication in children. While it may be prescribed for children as young as 2 years for selected conditions, precautions do apply. Overdosage: If you think you have taken too much of this medicine contact a poison control center or emergency room at once. NOTE: This medicine is only for you. Do not share this medicine with others. What if I miss a dose? Keep appointments for follow-up doses. It is important not to miss your dose. Call your care team if you are unable to keep an appointment. What may interact with this medication? Interactions have not been studied. This list may not describe all possible interactions. Give your health care provider a list of all the medicines, herbs, non-prescription drugs, or dietary supplements you use. Also tell them if you smoke, drink alcohol, or use illegal drugs. Some items may interact with your medicine. What should I watch for while using this medication? Your condition will be monitored carefully while you are receiving this medication. You may need blood work done while you are taking this medication. This medication may cause serious skin reactions. They can happen weeks to months after starting the medication. Contact your care team right away if you notice fevers or flu-like symptoms with a rash. The rash may be red or purple and then turn into blisters or peeling of the skin. You may also notice a red rash with swelling of the face, lips, or lymph nodes in your neck or under your arms. Talk to your care team if you may be pregnant. Serious birth defects can occur if you take this medication during pregnancy and for 5 months after  the last dose. You will need a negative pregnancy test before starting this medication. Contraception is recommended while taking this medication and for 5 months after the last dose. Your care team can help you find the option that works for you. Do not breastfeed while taking this medication and for 5 months after the last dose. This medication may cause infertility. Talk to your care team if you are concerned about your fertility. What side effects may I notice from receiving this medication? Side effects that you should report to your doctor or health care professional as soon as possible: Allergic reactions--skin rash, itching, hives, swelling of the face, lips, tongue, or throat Dry cough, shortness of breath or trouble breathing Eye pain, redness, irritation, or discharge with blurry or decreased vision Heart muscle inflammation--unusual weakness or fatigue, shortness of breath, chest pain, fast or irregular heartbeat, dizziness, swelling of the ankles, feet, or hands Hormone gland problems--headache, sensitivity to light, unusual weakness or fatigue, dizziness, fast or irregular heartbeat, increased sensitivity to cold or heat, excessive sweating, constipation, hair loss, increased thirst or amount of urine, tremors or shaking, irritability Infusion reactions--chest pain, shortness of breath or trouble breathing, feeling faint or lightheaded Kidney injury (glomerulonephritis)--decrease in the amount of urine, red or dark brown urine, foamy or bubbly  urine, swelling of the ankles, hands, or feet Liver injury--right upper belly pain, loss of appetite, nausea, light-colored stool, dark yellow or brown urine, yellowing skin or eyes, unusual weakness or fatigue Pain, tingling, or numbness in the hands or feet, muscle weakness, change in vision, confusion or trouble speaking, loss of balance or coordination, trouble walking, seizures Rash, fever, and swollen lymph nodes Redness, blistering, peeling,  or loosening of the skin, including inside the mouth Sudden or severe stomach pain, bloody diarrhea, fever, nausea, vomiting Side effects that usually do not require medical attention (report to your doctor or health care professional if they continue or are bothersome): Bone, joint, or muscle pain Diarrhea Fatigue Loss of appetite Nausea Skin rash This list may not describe all possible side effects. Call your doctor for medical advice about side effects. You may report side effects to FDA at 1-800-FDA-1088. Where should I keep my medication? This medication is given in a hospital or clinic. It will not be stored at home. NOTE: This sheet is a summary. It may not cover all possible information. If you have questions about this medicine, talk to your doctor, pharmacist, or health care provider.  2024 Elsevier/Gold Standard (2023-07-25 00:00:00)

## 2023-11-22 NOTE — Assessment & Plan Note (Signed)
Unfortunately, she really is limited as far as activity with back and leg pain and symptoms from her spinal stenosis as well as just overall general fatigue.  Recommended exercise pedal bike that she can use while seated.

## 2023-11-22 NOTE — Telephone Encounter (Signed)
I spoke with Crystal [name] regarding the recommendation for CT abdomen/pelvis.  She is in agreement.  Order has been placed.

## 2023-11-22 NOTE — Assessment & Plan Note (Signed)
Not overly symptomatic.  Stable on current dose of Lopressor 50 mg twice daily. -No change
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
## 2023-11-22 NOTE — Assessment & Plan Note (Signed)
Reluctant to be overly aggressive treating hypertension based on her history of orthostasis, especially in light of her ongoing cancer treatments.  Stressed importance of adequate hydration

## 2023-11-22 NOTE — Assessment & Plan Note (Signed)
Blood pressure well-controlled today on Lopressor 50 mg TID.   TID dosing is related to tachycardia and drops in pressures with higher doses. - Continue current medications.

## 2023-11-22 NOTE — Assessment & Plan Note (Signed)
Most recent LDL was 120.  She is very reluctant to not be on medications, and given her advanced age with yet again another metastatic cancer, I think and get hold off on adding another medication.  She does have coronary artery calcification on CT scan but is not having active symptoms. -Continue to monitor, would not treat until post cancer therapy.

## 2023-11-22 NOTE — Assessment & Plan Note (Signed)
Reports weakness in legs, causing difficulty with mobility. No pain reported. Using a rollator for assistance. -Encourage continued use of rollator for mobility and safety. -Encourage participation in walking club at assisted living facility for exercise.

## 2023-11-22 NOTE — Assessment & Plan Note (Signed)
Slightly below normal range (45-50%) on echocardiogram from March 2024. No symptoms of heart failure reported. Relatively euvolemic on exam, and exertional dyspnea is probably more related to deconditioning and obesity as opposed to CHF.  Simply because of this would be NYHA class II but is euvolemic basically. -Monitor for symptoms of heart failure (shortness of breath, swelling in ankles). --Would like to avoid diuretic

## 2023-11-22 NOTE — Assessment & Plan Note (Signed)
Notably improved EF after initially being dropped back at the time of her treatment for cancer in 2014.  EF then improved up to 50 to 55%.  Has been stable at 45 to 50% for the last several echoes.

## 2023-11-22 NOTE — Assessment & Plan Note (Signed)
Calcification noted in coronary arteries and aortic valve. No symptoms of chest pain or pressure. LDL slightly elevated. Discussed the potential benefit of statin therapy but decided to defer until cancer treatment is completed. -Continue to manage risk factors (blood pressure, blood sugar). -Consider statin therapy after completion of cancer treatment, depending on the patient's clinical status.

## 2023-11-22 NOTE — Assessment & Plan Note (Signed)
Has had recurrent PEs now and is on lifelong anticoagulation with DOAC:. On Eliquis due to DVT and PE.  No recent falls or bleeding events reported. -Continue Eliquis as prescribed.  Okay to hold Eliquis 2 to 3 days for procedures or surgeries.

## 2023-11-22 NOTE — Progress Notes (Signed)
Patient seen by Lonna Cobb NP today  Vitals are within treatment parameters:Yes  BP 112/94   Labs are within treatment parameters: Yes   Treatment plan has been signed: Yes   Per physician team, Patient is ready for treatment. Please note the following modifications: adjusting the dose  to a higher dose

## 2023-11-23 ENCOUNTER — Other Ambulatory Visit: Payer: Self-pay

## 2023-11-23 ENCOUNTER — Telehealth: Payer: Self-pay | Admitting: *Deleted

## 2023-11-23 NOTE — Telephone Encounter (Signed)
Patient called to inquire if her CT scan had been scheduled yet and if she needs to hold her Eliquis for port placement> Informed her scan not scheduled yet--has not been authorized yet. Does not need to hold Eliquis for port placement. Sent message to managed care to expedite PA process. Patient prefers early afternoon for scan.

## 2023-11-26 ENCOUNTER — Other Ambulatory Visit: Payer: Self-pay | Admitting: Physician Assistant

## 2023-11-26 DIAGNOSIS — Z01818 Encounter for other preprocedural examination: Secondary | ICD-10-CM

## 2023-11-27 ENCOUNTER — Ambulatory Visit (HOSPITAL_COMMUNITY)
Admission: RE | Admit: 2023-11-27 | Discharge: 2023-11-27 | Disposition: A | Discharge status: Home / Self Care | Payer: Medicare Other | Source: Ambulatory Visit | Attending: Nurse Practitioner | Admitting: Nurse Practitioner

## 2023-11-27 ENCOUNTER — Encounter (HOSPITAL_COMMUNITY): Payer: Self-pay

## 2023-11-27 DIAGNOSIS — C342 Malignant neoplasm of middle lobe, bronchus or lung: Secondary | ICD-10-CM | POA: Diagnosis present

## 2023-11-27 DIAGNOSIS — Z01818 Encounter for other preprocedural examination: Secondary | ICD-10-CM

## 2023-11-27 DIAGNOSIS — C3490 Malignant neoplasm of unspecified part of unspecified bronchus or lung: Secondary | ICD-10-CM

## 2023-11-27 HISTORY — PX: IR IMAGING GUIDED PORT INSERTION: IMG5740

## 2023-11-27 MED ORDER — SODIUM CHLORIDE 0.9% FLUSH: 3.0000 mL | Freq: Two times a day (BID) | INTRAVENOUS | Status: DC

## 2023-11-27 MED ORDER — LIDOCAINE HCL 1 % IJ SOLN
20.0000 mL | Freq: Once | INTRAMUSCULAR | Status: AC
  Administered 2023-11-27: 15 mL via INTRADERMAL

## 2023-11-27 MED ORDER — HEPARIN SOD (PORK) LOCK FLUSH 100 UNIT/ML IV SOLN
500.0000 [IU] | Freq: Once | INTRAVENOUS | Status: AC
  Administered 2023-11-27: 500 [IU] via INTRAVENOUS

## 2023-11-27 MED ORDER — SODIUM CHLORIDE 0.9 % IV SOLN: INTRAVENOUS | Status: DC

## 2023-11-27 MED ORDER — SODIUM CHLORIDE 0.9% FLUSH: 3.0000 mL | INTRAVENOUS | Status: DC | PRN

## 2023-11-27 MED ORDER — MIDAZOLAM HCL 2 MG/2ML IJ SOLN
INTRAMUSCULAR | Status: AC | PRN
  Administered 2023-11-27 (×2): .5 mg via INTRAVENOUS
  Administered 2023-11-27: 1 mg via INTRAVENOUS

## 2023-11-27 MED ORDER — LIDOCAINE HCL 1 % IJ SOLN
INTRAMUSCULAR | Status: AC
Start: 2023-11-27 — End: ?
  Filled 2023-11-27: qty 20

## 2023-11-27 MED ORDER — FENTANYL CITRATE (PF) 100 MCG/2ML IJ SOLN
INTRAMUSCULAR | Status: AC | PRN
  Administered 2023-11-27 (×2): 50 ug via INTRAVENOUS

## 2023-11-27 MED ORDER — FENTANYL CITRATE (PF) 100 MCG/2ML IJ SOLN
INTRAMUSCULAR | Status: AC
  Filled 2023-11-27: qty 2

## 2023-11-27 MED ORDER — HEPARIN SOD (PORK) LOCK FLUSH 100 UNIT/ML IV SOLN
INTRAVENOUS | Status: AC
  Filled 2023-11-27: qty 5

## 2023-11-27 MED ORDER — MIDAZOLAM HCL 2 MG/2ML IJ SOLN
INTRAMUSCULAR | Status: AC
Start: 2023-11-27 — End: ?
  Filled 2023-11-27: qty 2
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
## 2023-11-27 NOTE — Procedures (Signed)
 Interventional Radiology Procedure Note  Procedure: Single Lumen Power Port Placement    Access:  Right IJ vein.  Findings: Catheter tip positioned at SVC/RA junction. Port is ready for immediate use.   Complications: None  EBL: < 10 mL  Recommendations:  - Ok to shower in 24 hours - Do not submerge for 7 days - Routine line care   Maxi Carreras T. Fredia Sorrow, M.D Pager:  [phone]

## 2023-11-27 NOTE — Discharge Instructions (Addendum)
 Implanted Port Insertion, Care After  The following information offers guidance on how to care for yourself after your procedure. Your health care provider may also give you more specific instructions. If you have problems or questions, contact your health care provider.  What can I expect after the procedure? After the procedure, it is common to have: Discomfort at the port insertion site. Bruising on the skin over the port. This should improve over 3-4 days.   Urgent needs - Interventional Radiology, clinic [phone] (mon-fri 8-5).   Wound - May remove dressing and shower in 24 to 48 hours.  Keep site clean and dry.  Replace with bandaid as needed.  Do not submerge in tub or water until site healing well. If closed with glue, glue will flake off on its own.   If ordered by your provider, may start Emla cream (or any other creams ointments or lotions) in 2 weeks or after incision is healed. Port is ready for use immediately.   After completion of treatment, your provider should have you set up for monthly port flushes.   Follow these instructions at home: [facility] care After your port is placed, you will get a manufacturer's information card. The card has information about your port. Keep this card with you at all times. Take care of the port as told by your health care provider. Ask your health care provider if you or a family member can get training for taking care of the port at home. A home health care nurse will be be available to help care for the port. Make sure to remember what type of port you have. Incision care     Follow instructions from your health care provider about how to take care of your port insertion site. Make sure you: Wash your hands with soap and water for at least 20 seconds before and after you change your bandage (dressing). If soap and water are not available, use hand sanitizer. Change your dressing as told by your health care provider. Leave stitches  (sutures), skin glue, or adhesive strips in place. These skin closures may need to stay in place for 2 weeks or longer. If adhesive strip edges start to loosen and curl up, you may trim the loose edges. Do not remove adhesive strips completely unless your health care provider tells you to do that. Check your port insertion site every day for signs of infection. Check for: Redness, swelling, or pain. Fluid or blood. Warmth. Pus or a bad smell. Activity Return to your normal activities as told by your health care provider. Ask your health care provider what activities are safe for you. You may have to avoid lifting. Ask your health care provider how much you can safely lift. General instructions Take over-the-counter and prescription medicines only as told by your health care provider. Do not take baths, swim, or use a hot tub until your health care provider approves. Ask your health care provider if you may take showers. You may only be allowed to take sponge baths. If you were given a sedative during the procedure, it can affect you for several hours. Do not drive or operate machinery until your health care provider says that it is safe. Wear a medical alert bracelet in case of an emergency. This will tell any health care providers that you have a port. Keep all follow-up visits. This is important. Contact a health care provider if: You cannot flush your port with saline as directed, or you cannot  draw blood from the port. You have a fever or chills. You have redness, swelling, or pain around your port insertion site. You have fluid or blood coming from your port insertion site. Your port insertion site feels warm to the touch. You have pus or a bad smell coming from the port insertion site. Get help right away if: You have chest pain or shortness of breath. You have bleeding from your port that you cannot control. These symptoms may be an emergency. Get help right away. Call 911. Do not  wait to see if the symptoms will go away. Do not drive yourself to the hospital. Summary Take care of the port as told by your health care provider. Keep the manufacturer's information card with you at all times. Change your dressing as told by your health care provider. Contact a health care provider if you have a fever or chills or if you have redness, swelling, or pain around your port insertion site. Keep all follow-up visits. This information is not intended to replace advice given to you by your health care provider. Make sure you discuss any questions you have with your health care provider. Document Revised: 04/12/2021 Document Reviewed: 04/12/2021 Elsevier Patient Education  2023 [employer]    Moderate Conscious Sedation  Adult  Care After (English)  After the procedure, it is common to have: Sleepiness for a few hours. Impaired judgment for a few hours. Trouble with balance. Nausea or vomiting if you eat too soon. Follow these instructions at home: For the time period you were told by your health care provider:  Rest. Do not participate in activities where you could fall or become injured. Do not drive or use machinery. Do not drink alcohol. Do not take sleeping pills or medicines that cause drowsiness. Do not make important decisions or sign legal documents. Do not take care of children on your own. Eating and drinking Follow instructions from your health care provider about what you may eat and drink. Drink enough fluid to keep your urine pale yellow. If you vomit: Drink clear fluids slowly and in small amounts as you are able. Clear fluids include water, ice chips, low-calorie sports drinks, and fruit juice that has water added to it (diluted fruit juice). Eat light and bland foods in small amounts as you are able. These foods include bananas, applesauce, rice, lean meats, toast, and crackers. General instructions Take over-the-counter and prescription medicines  only as told by your health care provider. Have a responsible adult stay with you for the time you are told. Do not use any products that contain nicotine or tobacco. These products include cigarettes, chewing tobacco, and vaping devices, such as e-cigarettes. If you need help quitting, ask your health care provider. Return to your normal activities as told by your health care provider. Ask your health care provider what activities are safe for you. Your health care provider may give you more instructions. Make sure you know what you can and cannot do. Contact a health care provider if: You are still sleepy or having trouble with balance after 24 hours. You feel light-headed. You vomit every time you eat or drink. You get a rash. You have a fever. You have redness or swelling around the IV site. Get help right away if: You have trouble breathing. You start to feel confused at home. These symptoms may be an emergency. Get help right away. Call 911. Do not wait to see if the symptoms will go away. Do not  drive yourself to the hospital. This information is not intended to replace advice given to you by your health care provider. Make sure you discuss any questions you have with your health care provider.

## 2023-11-28 ENCOUNTER — Telehealth: Payer: Self-pay | Admitting: *Deleted

## 2023-11-28 ENCOUNTER — Encounter: Payer: Self-pay | Admitting: Oncology

## 2023-11-28 ENCOUNTER — Other Ambulatory Visit (HOSPITAL_BASED_OUTPATIENT_CLINIC_OR_DEPARTMENT_OTHER): Payer: Self-pay

## 2023-11-28 MED ORDER — LIDOCAINE-PRILOCAINE 2.5-2.5 % EX CREA
1.0000 | TOPICAL_CREAM | CUTANEOUS | 3 refills | Status: DC
  Filled 2023-11-28: qty 30, 14d supply, fill #0

## 2023-11-28 NOTE — Telephone Encounter (Signed)
Called patient with CT scan appointment date/time and prep. She is requesting EMLA cream for her port when she has her treatment on 12/20/23.

## 2023-12-05 ENCOUNTER — Other Ambulatory Visit (HOSPITAL_BASED_OUTPATIENT_CLINIC_OR_DEPARTMENT_OTHER): Payer: Medicare Other

## 2023-12-08 ENCOUNTER — Other Ambulatory Visit: Payer: Self-pay

## 2023-12-10 ENCOUNTER — Other Ambulatory Visit (HOSPITAL_BASED_OUTPATIENT_CLINIC_OR_DEPARTMENT_OTHER): Payer: Self-pay

## 2023-12-13 ENCOUNTER — Ambulatory Visit (HOSPITAL_BASED_OUTPATIENT_CLINIC_OR_DEPARTMENT_OTHER): Payer: Medicare Other

## 2023-12-13 NOTE — Addendum Note (Signed)
Encounter addended by: Edward Qualia on: 12/13/2023 9:21 AM  Actions taken: Imaging Exam ended

## 2023-12-14 ENCOUNTER — Other Ambulatory Visit: Payer: Self-pay | Admitting: Oncology

## 2023-12-17 ENCOUNTER — Other Ambulatory Visit (HOSPITAL_BASED_OUTPATIENT_CLINIC_OR_DEPARTMENT_OTHER): Payer: Medicare Other

## 2023-12-18 ENCOUNTER — Ambulatory Visit (HOSPITAL_BASED_OUTPATIENT_CLINIC_OR_DEPARTMENT_OTHER)
Admission: RE | Admit: 2023-12-18 | Discharge: 2023-12-18 | Disposition: A | Discharge status: Home / Self Care | Payer: Medicare Other | Source: Ambulatory Visit | Attending: Nurse Practitioner | Admitting: Nurse Practitioner

## 2023-12-18 DIAGNOSIS — C3490 Malignant neoplasm of unspecified part of unspecified bronchus or lung: Secondary | ICD-10-CM | POA: Insufficient documentation

## 2023-12-20 ENCOUNTER — Inpatient Hospital Stay: Payer: Medicare Other | Attending: Oncology

## 2023-12-20 ENCOUNTER — Inpatient Hospital Stay: Payer: Medicare Other

## 2023-12-20 ENCOUNTER — Inpatient Hospital Stay: Payer: Medicare Other | Admitting: Oncology

## 2023-12-20 VITALS — BP 105/76 | HR 92 | Temp 98.2°F | Resp 18 | Ht 65.0 in | Wt 204.0 lb

## 2023-12-20 VITALS — BP 103/64 | HR 98

## 2023-12-20 DIAGNOSIS — Z5112 Encounter for antineoplastic immunotherapy: Secondary | ICD-10-CM | POA: Insufficient documentation

## 2023-12-20 DIAGNOSIS — C3490 Malignant neoplasm of unspecified part of unspecified bronchus or lung: Secondary | ICD-10-CM

## 2023-12-20 DIAGNOSIS — C342 Malignant neoplasm of middle lobe, bronchus or lung: Secondary | ICD-10-CM | POA: Insufficient documentation

## 2023-12-20 DIAGNOSIS — Z79620 Long term (current) use of immunosuppressive biologic: Secondary | ICD-10-CM | POA: Diagnosis not present

## 2023-12-20 LAB — CBC WITH DIFFERENTIAL (CANCER CENTER ONLY)
Abs Immature Granulocytes: 0.01 10*3/uL (ref 0.00–0.07)
Basophils Absolute: 0 10*3/uL (ref 0.0–0.1)
Basophils Relative: 0 %
Eosinophils Absolute: 0 10*3/uL (ref 0.0–0.5)
Eosinophils Relative: 0 %
HCT: 39.5 % (ref 36.0–46.0)
Hemoglobin: 12.6 g/dL (ref 12.0–15.0)
Immature Granulocytes: 0 %
Lymphocytes Relative: 31 %
Lymphs Abs: 1.7 10*3/uL (ref 0.7–4.0)
MCH: 31 pg (ref 26.0–34.0)
MCHC: 31.9 g/dL (ref 30.0–36.0)
MCV: 97.3 fL (ref 80.0–100.0)
Monocytes Absolute: 0.5 10*3/uL (ref 0.1–1.0)
Monocytes Relative: 9 %
Neutro Abs: 3.1 10*3/uL (ref 1.7–7.7)
Neutrophils Relative %: 60 %
Platelet Count: 191 10*3/uL (ref 150–400)
RBC: 4.06 MIL/uL (ref 3.87–5.11)
RDW: 14.6 % (ref 11.5–15.5)
WBC Count: 5.3 10*3/uL (ref 4.0–10.5)
nRBC: 0 % (ref 0.0–0.2)

## 2023-12-20 LAB — CMP (CANCER CENTER ONLY)
ALT: 13 U/L (ref 0–44)
AST: 16 U/L (ref 15–41)
Albumin: 3.7 g/dL (ref 3.5–5.0)
Alkaline Phosphatase: 77 U/L (ref 38–126)
Anion gap: 7 (ref 5–15)
BUN: 20 mg/dL (ref 8–23)
CO2: 25 mmol/L (ref 22–32)
Calcium: 8.5 mg/dL — ABNORMAL LOW (ref 8.9–10.3)
Chloride: 108 mmol/L (ref 98–111)
Creatinine: 1.24 mg/dL — ABNORMAL HIGH (ref 0.44–1.00)
GFR, Estimated: 45 mL/min — ABNORMAL LOW (ref >60)
Glucose, Bld: 121 mg/dL — ABNORMAL HIGH (ref 70–99)
Potassium: 4 mmol/L (ref 3.5–5.1)
Sodium: 140 mmol/L (ref 135–145)
Total Bilirubin: 0.4 mg/dL (ref 0.0–1.2)
Total Protein: 6.4 g/dL — ABNORMAL LOW (ref 6.5–8.1)

## 2023-12-20 LAB — TSH: TSH: 1.263 u[IU]/mL (ref 0.350–4.500)

## 2023-12-20 MED ORDER — SODIUM CHLORIDE 0.9 % IV SOLN
1680.0000 mg | Freq: Once | INTRAVENOUS | Status: AC
  Administered 2023-12-20: 1680 mg via INTRAVENOUS
  Filled 2023-12-20: qty 28

## 2023-12-20 MED ORDER — SODIUM CHLORIDE 0.9 % IV SOLN: Freq: Once | INTRAVENOUS | Status: AC

## 2023-12-20 MED ORDER — HEPARIN SOD (PORK) LOCK FLUSH 100 UNIT/ML IV SOLN
500.0000 [IU] | Freq: Once | INTRAVENOUS | Status: AC | PRN
  Administered 2023-12-20: 500 [IU]

## 2023-12-20 MED ORDER — SODIUM CHLORIDE 0.9% FLUSH
10.0000 mL | INTRAVENOUS | Status: DC | PRN
  Administered 2023-12-20: 10 mL

## 2023-12-20 NOTE — Progress Notes (Signed)
 Patient seen by Dr. Thornton Papas today  Vitals are within treatment parameters:Yes   Labs are within treatment parameters: Yes   Treatment plan has been signed: Yes   Per physician team, Patient is ready for treatment and there are NO modifications to the treatment plan.
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
## 2023-12-20 NOTE — Patient Instructions (Signed)
 [facility] - A DEPT OF [facility]   Discharge Instructions: Thank you for choosing [facility] to provide your oncology and hematology care.   If you have a lab appointment with the Cancer Center, please go directly to the Cancer Center and check in at the registration area.   Wear comfortable clothing and clothing appropriate for easy access to any Portacath or PICC line.   We strive to give you quality time with your provider. You may need to reschedule your appointment if you arrive late (15 or more minutes).  Arriving late affects you and other patients whose appointments are after yours.  Also, if you miss three or more appointments without notifying the office, you may be dismissed from the clinic at the provider's discretion.      For prescription refill requests, have your pharmacy contact our office and allow 72 hours for refills to be completed.    Today you received the following chemotherapy and/or immunotherapy agents TECENTRIQ      To help prevent nausea and vomiting after your treatment, we encourage you to take your nausea medication as directed.  BELOW ARE SYMPTOMS THAT SHOULD BE REPORTED IMMEDIATELY: *FEVER GREATER THAN 100.4 F (38 C) OR HIGHER *CHILLS OR SWEATING *NAUSEA AND VOMITING THAT IS NOT CONTROLLED WITH YOUR NAUSEA MEDICATION *UNUSUAL SHORTNESS OF BREATH *UNUSUAL BRUISING OR BLEEDING *URINARY PROBLEMS (pain or burning when urinating, or frequent urination) *BOWEL PROBLEMS (unusual diarrhea, constipation, pain near the anus) TENDERNESS IN MOUTH AND THROAT WITH OR WITHOUT PRESENCE OF ULCERS (sore throat, sores in mouth, or a toothache) UNUSUAL RASH, SWELLING OR PAIN  UNUSUAL VAGINAL DISCHARGE OR ITCHING   Items with * indicate a potential emergency and should be followed up as soon as possible or go to the Emergency Department if any problems should occur.  Please show the CHEMOTHERAPY ALERT CARD or IMMUNOTHERAPY  ALERT CARD at check-in to the Emergency Department and triage nurse.  Should you have questions after your visit or need to cancel or reschedule your appointment, please contact [facility] - A DEPT OF [facility]  Dept: [phone]  and follow the prompts.  Office hours are 8:00 a.m. to 4:30 p.m. Monday - Friday. Please note that voicemails left after 4:00 p.m. may not be returned until the following business day.  We are closed weekends and major holidays. You have access to a nurse at all times for urgent questions. Please call the main number to the clinic Dept: [phone] and follow the prompts.   For any non-urgent questions, you may also contact your provider using MyChart. We now offer e-Visits for anyone 64 and older to request care online for non-urgent symptoms. For details visit [email or web address].   Also download the MyChart app! Go to the app store, search "MyChart", open the app, select [place], and log in with your MyChart username and password.  Atezolizumab Injection What is this medication? ATEZOLIZUMAB (a te zoe LIZ ue mab) treats some types of cancer. It works by helping your immune system slow or stop the spread of cancer cells. It is a monoclonal antibody. This medicine may be used for other purposes; ask your health care provider or pharmacist if you have questions. COMMON BRAND NAME(S): Tecentriq What should I tell my care team before I take this medication? They need to know if you have any of these conditions: Allogeneic stem cell transplant (uses someone else's stem cells) Autoimmune diseases,  such as Crohn disease, ulcerative colitis, lupus History of chest radiation Nervous system problems, such as Guillain-Barre syndrome, myasthenia gravis Organ transplant An unusual or allergic reaction to atezolizumab, other medications, foods, dyes, or preservatives Pregnant or trying to get pregnant Breast-feeding How should I use  this medication? This medication is injected into a vein. It is given by your care team in a hospital or clinic setting. A special MedGuide will be given to you before each treatment. Be sure to read this information carefully each time. Talk to your care team about the use of this medication in children. While it may be prescribed for children as young as 2 years for selected conditions, precautions do apply. Overdosage: If you think you have taken too much of this medicine contact a poison control center or emergency room at once. NOTE: This medicine is only for you. Do not share this medicine with others. What if I miss a dose? Keep appointments for follow-up doses. It is important not to miss your dose. Call your care team if you are unable to keep an appointment. What may interact with this medication? Interactions have not been studied. This list may not describe all possible interactions. Give your health care provider a list of all the medicines, herbs, non-prescription drugs, or dietary supplements you use. Also tell them if you smoke, drink alcohol, or use illegal drugs. Some items may interact with your medicine. What should I watch for while using this medication? Your condition will be monitored carefully while you are receiving this medication. You may need blood work done while you are taking this medication. This medication may cause serious skin reactions. They can happen weeks to months after starting the medication. Contact your care team right away if you notice fevers or flu-like symptoms with a rash. The rash may be red or purple and then turn into blisters or peeling of the skin. You may also notice a red rash with swelling of the face, lips, or lymph nodes in your neck or under your arms. Talk to your care team if you may be pregnant. Serious birth defects can occur if you take this medication during pregnancy and for 5 months after the last dose. You will need a negative  pregnancy test before starting this medication. Contraception is recommended while taking this medication and for 5 months after the last dose. Your care team can help you find the option that works for you. Do not breastfeed while taking this medication and for 5 months after the last dose. This medication may cause infertility. Talk to your care team if you are concerned about your fertility. What side effects may I notice from receiving this medication? Side effects that you should report to your doctor or health care professional as soon as possible: Allergic reactions--skin rash, itching, hives, swelling of the face, lips, tongue, or throat Dry cough, shortness of breath or trouble breathing Eye pain, redness, irritation, or discharge with blurry or decreased vision Heart muscle inflammation--unusual weakness or fatigue, shortness of breath, chest pain, fast or irregular heartbeat, dizziness, swelling of the ankles, feet, or hands Hormone gland problems--headache, sensitivity to light, unusual weakness or fatigue, dizziness, fast or irregular heartbeat, increased sensitivity to cold or heat, excessive sweating, constipation, hair loss, increased thirst or amount of urine, tremors or shaking, irritability Infusion reactions--chest pain, shortness of breath or trouble breathing, feeling faint or lightheaded Kidney injury (glomerulonephritis)--decrease in the amount of urine, red or dark brown urine, foamy or bubbly urine,  swelling of the ankles, hands, or feet Liver injury--right upper belly pain, loss of appetite, nausea, light-colored stool, dark yellow or brown urine, yellowing skin or eyes, unusual weakness or fatigue Pain, tingling, or numbness in the hands or feet, muscle weakness, change in vision, confusion or trouble speaking, loss of balance or coordination, trouble walking, seizures Rash, fever, and swollen lymph nodes Redness, blistering, peeling, or loosening of the skin, including  inside the mouth Sudden or severe stomach pain, bloody diarrhea, fever, nausea, vomiting Side effects that usually do not require medical attention (report to your doctor or health care professional if they continue or are bothersome): Bone, joint, or muscle pain Diarrhea Fatigue Loss of appetite Nausea Skin rash This list may not describe all possible side effects. Call your doctor for medical advice about side effects. You may report side effects to FDA at 1-800-FDA-1088. Where should I keep my medication? This medication is given in a hospital or clinic. It will not be stored at home. NOTE: This sheet is a summary. It may not cover all possible information. If you have questions about this medicine, talk to your doctor, pharmacist, or health care provider.  2024 Elsevier/Gold Standard (2023-07-25 00:00:00)

## 2023-12-21 ENCOUNTER — Other Ambulatory Visit: Payer: Self-pay | Admitting: *Deleted

## 2023-12-21 DIAGNOSIS — C3490 Malignant neoplasm of unspecified part of unspecified bronchus or lung: Secondary | ICD-10-CM

## 2023-12-21 LAB — T4: T4, Total: 7.8 ug/dL (ref 4.5–12.0)

## 2023-12-26 ENCOUNTER — Other Ambulatory Visit (HOSPITAL_BASED_OUTPATIENT_CLINIC_OR_DEPARTMENT_OTHER): Payer: Medicare Other

## 2024-01-13 ENCOUNTER — Other Ambulatory Visit: Payer: Self-pay | Admitting: Oncology

## 2024-01-13 DIAGNOSIS — C342 Malignant neoplasm of middle lobe, bronchus or lung: Secondary | ICD-10-CM

## 2024-01-13 DIAGNOSIS — C3490 Malignant neoplasm of unspecified part of unspecified bronchus or lung: Secondary | ICD-10-CM

## 2024-01-14 ENCOUNTER — Telehealth: Payer: Self-pay

## 2024-01-14 NOTE — Telephone Encounter (Signed)
 The patient reached out to request a letter addressed to [facility]) stating that she no longer requires oxygen therapy. She would like the oxygen provider to arrange for the collection of the tanks; however, the insurance company mandates written confirmation of her current needs prior to taking any action. I advised the patient that during her visit this week, she and her healthcare provider will have a detailed discussion regarding her requirements.

## 2024-01-17 ENCOUNTER — Inpatient Hospital Stay: Payer: Medicare Other | Attending: Oncology

## 2024-01-17 ENCOUNTER — Inpatient Hospital Stay (HOSPITAL_BASED_OUTPATIENT_CLINIC_OR_DEPARTMENT_OTHER): Payer: Medicare Other | Admitting: Nurse Practitioner

## 2024-01-17 ENCOUNTER — Encounter: Payer: Self-pay | Admitting: Nurse Practitioner

## 2024-01-17 ENCOUNTER — Inpatient Hospital Stay: Payer: Medicare Other

## 2024-01-17 VITALS — BP 131/84 | HR 92 | Temp 98.1°F | Resp 18 | Ht 65.0 in | Wt 208.0 lb

## 2024-01-17 DIAGNOSIS — C3490 Malignant neoplasm of unspecified part of unspecified bronchus or lung: Secondary | ICD-10-CM

## 2024-01-17 DIAGNOSIS — Z5112 Encounter for antineoplastic immunotherapy: Secondary | ICD-10-CM | POA: Diagnosis present

## 2024-01-17 DIAGNOSIS — C342 Malignant neoplasm of middle lobe, bronchus or lung: Secondary | ICD-10-CM | POA: Diagnosis not present

## 2024-01-17 DIAGNOSIS — Z452 Encounter for adjustment and management of vascular access device: Secondary | ICD-10-CM

## 2024-01-17 LAB — CBC WITH DIFFERENTIAL (CANCER CENTER ONLY)
Abs Immature Granulocytes: 0.01 10*3/uL (ref 0.00–0.07)
Basophils Absolute: 0 10*3/uL (ref 0.0–0.1)
Basophils Relative: 0 %
Eosinophils Absolute: 0 10*3/uL (ref 0.0–0.5)
Eosinophils Relative: 1 %
HCT: 37.9 % (ref 36.0–46.0)
Hemoglobin: 12.5 g/dL (ref 12.0–15.0)
Immature Granulocytes: 0 %
Lymphocytes Relative: 32 %
Lymphs Abs: 1.8 10*3/uL (ref 0.7–4.0)
MCH: 31.5 pg (ref 26.0–34.0)
MCHC: 33 g/dL (ref 30.0–36.0)
MCV: 95.5 fL (ref 80.0–100.0)
Monocytes Absolute: 0.4 10*3/uL (ref 0.1–1.0)
Monocytes Relative: 8 %
Neutro Abs: 3.4 10*3/uL (ref 1.7–7.7)
Neutrophils Relative %: 59 %
Platelet Count: 188 10*3/uL (ref 150–400)
RBC: 3.97 MIL/uL (ref 3.87–5.11)
RDW: 14.7 % (ref 11.5–15.5)
WBC Count: 5.7 10*3/uL (ref 4.0–10.5)
nRBC: 0 % (ref 0.0–0.2)

## 2024-01-17 LAB — CMP (CANCER CENTER ONLY)
ALT: 12 U/L (ref 0–44)
AST: 18 U/L (ref 15–41)
Albumin: 3.7 g/dL (ref 3.5–5.0)
Alkaline Phosphatase: 90 U/L (ref 38–126)
Anion gap: 5 (ref 5–15)
BUN: 22 mg/dL (ref 8–23)
CO2: 28 mmol/L (ref 22–32)
Calcium: 8.8 mg/dL — ABNORMAL LOW (ref 8.9–10.3)
Chloride: 106 mmol/L (ref 98–111)
Creatinine: 1.18 mg/dL — ABNORMAL HIGH (ref 0.44–1.00)
GFR, Estimated: 47 mL/min — ABNORMAL LOW (ref >60)
Glucose, Bld: 94 mg/dL (ref 70–99)
Potassium: 3.9 mmol/L (ref 3.5–5.1)
Sodium: 139 mmol/L (ref 135–145)
Total Bilirubin: 0.4 mg/dL (ref 0.0–1.2)
Total Protein: 6.5 g/dL (ref 6.5–8.1)

## 2024-01-17 MED ORDER — SODIUM CHLORIDE 0.9% FLUSH
10.0000 mL | Freq: Once | INTRAVENOUS | Status: AC
  Administered 2024-01-17: 10 mL

## 2024-01-17 MED ORDER — HEPARIN SOD (PORK) LOCK FLUSH 100 UNIT/ML IV SOLN
500.0000 [IU] | Freq: Once | INTRAVENOUS | Status: AC | PRN
  Administered 2024-01-17: 500 [IU]

## 2024-01-17 MED ORDER — SODIUM CHLORIDE 0.9% FLUSH
10.0000 mL | INTRAVENOUS | Status: DC | PRN
  Administered 2024-01-17: 10 mL

## 2024-01-17 MED ORDER — SODIUM CHLORIDE 0.9 % IV SOLN: Freq: Once | INTRAVENOUS | Status: AC

## 2024-01-17 MED ORDER — SODIUM CHLORIDE 0.9 % IV SOLN
1680.0000 mg | Freq: Once | INTRAVENOUS | Status: AC
  Administered 2024-01-17: 1680 mg via INTRAVENOUS
  Filled 2024-01-17: qty 28
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
## 2024-01-17 NOTE — Progress Notes (Signed)
 Patient seen by Lonna Cobb NP today  Vitals are within treatment parameters:Yes  Increased 4 pounds  Labs are within treatment parameters: Yes   Treatment plan has been signed: Yes   Per physician team, Patient is ready for treatment and there are NO modifications to the treatment plan.

## 2024-01-18 ENCOUNTER — Other Ambulatory Visit: Payer: Self-pay

## 2024-02-06 ENCOUNTER — Other Ambulatory Visit: Payer: Self-pay

## 2024-02-08 ENCOUNTER — Other Ambulatory Visit: Payer: Self-pay | Admitting: *Deleted

## 2024-02-08 DIAGNOSIS — C3490 Malignant neoplasm of unspecified part of unspecified bronchus or lung: Secondary | ICD-10-CM

## 2024-02-08 MED ORDER — POTASSIUM CHLORIDE CRYS ER 20 MEQ PO TBCR: 20.0000 meq | EXTENDED_RELEASE_TABLET | Freq: Every day | ORAL | 1 refills | Status: DC

## 2024-02-08 MED ORDER — FERROUS SULFATE 325 (65 FE) MG PO TABS: 325.0000 mg | ORAL_TABLET | Freq: Every day | ORAL | 1 refills | Status: DC

## 2024-02-08 NOTE — Telephone Encounter (Signed)
 Requesting refills on K+ and ferrous sulfate  if MD wants her to continue.

## 2024-02-09 ENCOUNTER — Other Ambulatory Visit: Payer: Self-pay | Admitting: Oncology

## 2024-02-13 ENCOUNTER — Telehealth: Payer: Self-pay

## 2024-02-13 NOTE — Telephone Encounter (Signed)
 The patient called to inform us  that she needs to have her oxygen  tanks renewed. I have advised her that I will notify the provider.

## 2024-02-14 ENCOUNTER — Inpatient Hospital Stay: Attending: Oncology

## 2024-02-14 ENCOUNTER — Inpatient Hospital Stay

## 2024-02-14 ENCOUNTER — Encounter: Payer: Self-pay | Admitting: *Deleted

## 2024-02-14 ENCOUNTER — Inpatient Hospital Stay: Admitting: Oncology

## 2024-02-14 VITALS — BP 120/70 | HR 96 | Temp 98.1°F | Resp 18

## 2024-02-14 VITALS — BP 131/90 | HR 94 | Temp 98.2°F | Resp 18 | Ht 65.0 in | Wt 212.0 lb

## 2024-02-14 DIAGNOSIS — Z5112 Encounter for antineoplastic immunotherapy: Secondary | ICD-10-CM | POA: Insufficient documentation

## 2024-02-14 DIAGNOSIS — C342 Malignant neoplasm of middle lobe, bronchus or lung: Secondary | ICD-10-CM | POA: Diagnosis present

## 2024-02-14 DIAGNOSIS — Z79899 Other long term (current) drug therapy: Secondary | ICD-10-CM | POA: Insufficient documentation

## 2024-02-14 DIAGNOSIS — C3490 Malignant neoplasm of unspecified part of unspecified bronchus or lung: Secondary | ICD-10-CM

## 2024-02-14 DIAGNOSIS — Z95828 Presence of other vascular implants and grafts: Secondary | ICD-10-CM

## 2024-02-14 LAB — CMP (CANCER CENTER ONLY)
ALT: 15 U/L (ref 0–44)
AST: 23 U/L (ref 15–41)
Albumin: 3.6 g/dL (ref 3.5–5.0)
Alkaline Phosphatase: 110 U/L (ref 38–126)
Anion gap: 7 (ref 5–15)
BUN: 24 mg/dL — ABNORMAL HIGH (ref 8–23)
CO2: 26 mmol/L (ref 22–32)
Calcium: 9.2 mg/dL (ref 8.9–10.3)
Chloride: 107 mmol/L (ref 98–111)
Creatinine: 1.33 mg/dL — ABNORMAL HIGH (ref 0.44–1.00)
GFR, Estimated: 41 mL/min — ABNORMAL LOW (ref >60)
Glucose, Bld: 97 mg/dL (ref 70–99)
Potassium: 4.4 mmol/L (ref 3.5–5.1)
Sodium: 140 mmol/L (ref 135–145)
Total Bilirubin: 0.3 mg/dL (ref 0.0–1.2)
Total Protein: 6.4 g/dL — ABNORMAL LOW (ref 6.5–8.1)

## 2024-02-14 LAB — CBC WITH DIFFERENTIAL (CANCER CENTER ONLY)
Abs Immature Granulocytes: 0.01 10*3/uL (ref 0.00–0.07)
Basophils Absolute: 0 10*3/uL (ref 0.0–0.1)
Basophils Relative: 0 %
Eosinophils Absolute: 0 10*3/uL (ref 0.0–0.5)
Eosinophils Relative: 1 %
HCT: 37.8 % (ref 36.0–46.0)
Hemoglobin: 12 g/dL (ref 12.0–15.0)
Immature Granulocytes: 0 %
Lymphocytes Relative: 34 %
Lymphs Abs: 1.5 10*3/uL (ref 0.7–4.0)
MCH: 30.8 pg (ref 26.0–34.0)
MCHC: 31.7 g/dL (ref 30.0–36.0)
MCV: 97.2 fL (ref 80.0–100.0)
Monocytes Absolute: 0.4 10*3/uL (ref 0.1–1.0)
Monocytes Relative: 9 %
Neutro Abs: 2.5 10*3/uL (ref 1.7–7.7)
Neutrophils Relative %: 56 %
Platelet Count: 183 10*3/uL (ref 150–400)
RBC: 3.89 MIL/uL (ref 3.87–5.11)
RDW: 15.1 % (ref 11.5–15.5)
WBC Count: 4.5 10*3/uL (ref 4.0–10.5)
nRBC: 0 % (ref 0.0–0.2)

## 2024-02-14 MED ORDER — SODIUM CHLORIDE 0.9% FLUSH
10.0000 mL | INTRAVENOUS | Status: DC | PRN
  Administered 2024-02-14: 10 mL via INTRAVENOUS

## 2024-02-14 MED ORDER — SODIUM CHLORIDE 0.9 % IV SOLN: Freq: Once | INTRAVENOUS | Status: AC

## 2024-02-14 MED ORDER — HEPARIN SOD (PORK) LOCK FLUSH 100 UNIT/ML IV SOLN
500.0000 [IU] | Freq: Once | INTRAVENOUS | Status: AC | PRN
  Administered 2024-02-14: 500 [IU]

## 2024-02-14 MED ORDER — SODIUM CHLORIDE 0.9 % IV SOLN
1680.0000 mg | Freq: Once | INTRAVENOUS | Status: AC
  Administered 2024-02-14: 1680 mg via INTRAVENOUS
  Filled 2024-02-14: qty 28

## 2024-02-14 MED ORDER — SODIUM CHLORIDE 0.9% FLUSH
10.0000 mL | INTRAVENOUS | Status: DC | PRN
  Administered 2024-02-14: 10 mL

## 2024-02-14 NOTE — Progress Notes (Signed)
 [facility] called patient to recertify her oxygen  or they will need to pick it up. O2 sat testing in office today: Room air at rest =94% Ambulation on room air =90% 2 l/min at rest = 98% 2 l/min w/ambulation = 90% She no longer qualifies for oxygen . She declined an overnight test to qualify. OK to pick up oxygen . Called [facility] and was told to fax order to d/c oxygen  to [phone] and this was done.
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
## 2024-02-14 NOTE — Patient Instructions (Signed)
 [facility] - A DEPT OF . [facility]  Discharge Instructions: Thank you for choosing [facility] to provide your oncology and hematology care.   If you have a lab appointment with the Cancer Center, please go directly to the Cancer Center and check in at the registration area.   Wear comfortable clothing and clothing appropriate for easy access to any Portacath or PICC line.   We strive to give you quality time with your provider. You may need to reschedule your appointment if you arrive late (15 or more minutes).  Arriving late affects you and other patients whose appointments are after yours.  Also, if you miss three or more appointments without notifying the office, you may be dismissed from the clinic at the provider's discretion.      For prescription refill requests, have your pharmacy contact our office and allow 72 hours for refills to be completed.    Today you received the following chemotherapy and/or immunotherapy agents Tecentriq       To help prevent nausea and vomiting after your treatment, we encourage you to take your nausea medication as directed.  BELOW ARE SYMPTOMS THAT SHOULD BE REPORTED IMMEDIATELY: *FEVER GREATER THAN 100.4 F (38 C) OR HIGHER *CHILLS OR SWEATING *NAUSEA AND VOMITING THAT IS NOT CONTROLLED WITH YOUR NAUSEA MEDICATION *UNUSUAL SHORTNESS OF BREATH *UNUSUAL BRUISING OR BLEEDING *URINARY PROBLEMS (pain or burning when urinating, or frequent urination) *BOWEL PROBLEMS (unusual diarrhea, constipation, pain near the anus) TENDERNESS IN MOUTH AND THROAT WITH OR WITHOUT PRESENCE OF ULCERS (sore throat, sores in mouth, or a toothache) UNUSUAL RASH, SWELLING OR PAIN  UNUSUAL VAGINAL DISCHARGE OR ITCHING   Items with * indicate a potential emergency and should be followed up as soon as possible or go to the Emergency Department if any problems should occur.  Please show the CHEMOTHERAPY ALERT CARD or IMMUNOTHERAPY  ALERT CARD at check-in to the Emergency Department and triage nurse.  Should you have questions after your visit or need to cancel or reschedule your appointment, please contact [facility] - A DEPT OF [facility]  Dept: [phone]  and follow the prompts.  Office hours are 8:00 a.m. to 4:30 p.m. Monday - Friday. Please note that voicemails left after 4:00 p.m. may not be returned until the following business day.  We are closed weekends and major holidays. You have access to a nurse at all times for urgent questions. Please call the main number to the clinic Dept: [phone] and follow the prompts.   For any non-urgent questions, you may also contact your provider using MyChart. We now offer e-Visits for anyone 61 and older to request care online for non-urgent symptoms. For details visit [email or web address].   Also download the MyChart app! Go to the app store, search "MyChart", open the app, select [place], and log in with your MyChart username and password.

## 2024-02-14 NOTE — Progress Notes (Signed)
 Patient seen by Dr. Thornton Papas today  Vitals are within treatment parameters:Yes   Labs are within treatment parameters: Yes   Treatment plan has been signed: Yes   Per physician team, Patient is ready for treatment and there are NO modifications to the treatment plan.

## 2024-02-15 ENCOUNTER — Other Ambulatory Visit: Payer: Self-pay

## 2024-02-20 ENCOUNTER — Other Ambulatory Visit: Payer: Self-pay

## 2024-03-13 ENCOUNTER — Inpatient Hospital Stay: Attending: Oncology

## 2024-03-13 ENCOUNTER — Inpatient Hospital Stay

## 2024-03-13 ENCOUNTER — Inpatient Hospital Stay: Admitting: Oncology

## 2024-03-13 ENCOUNTER — Other Ambulatory Visit: Payer: Self-pay | Admitting: *Deleted

## 2024-03-13 VITALS — BP 114/74 | HR 99 | Temp 98.1°F | Resp 18 | Ht 65.0 in | Wt 211.2 lb

## 2024-03-13 DIAGNOSIS — C3490 Malignant neoplasm of unspecified part of unspecified bronchus or lung: Secondary | ICD-10-CM

## 2024-03-13 DIAGNOSIS — Z5112 Encounter for antineoplastic immunotherapy: Secondary | ICD-10-CM | POA: Diagnosis present

## 2024-03-13 DIAGNOSIS — C7951 Secondary malignant neoplasm of bone: Secondary | ICD-10-CM | POA: Insufficient documentation

## 2024-03-13 DIAGNOSIS — C342 Malignant neoplasm of middle lobe, bronchus or lung: Secondary | ICD-10-CM

## 2024-03-13 LAB — CBC WITH DIFFERENTIAL (CANCER CENTER ONLY)
Abs Immature Granulocytes: 0.02 10*3/uL (ref 0.00–0.07)
Basophils Absolute: 0 10*3/uL (ref 0.0–0.1)
Basophils Relative: 1 %
Eosinophils Absolute: 0 10*3/uL (ref 0.0–0.5)
Eosinophils Relative: 1 %
HCT: 38.9 % (ref 36.0–46.0)
Hemoglobin: 12.3 g/dL (ref 12.0–15.0)
Immature Granulocytes: 0 %
Lymphocytes Relative: 28 %
Lymphs Abs: 1.8 10*3/uL (ref 0.7–4.0)
MCH: 30.9 pg (ref 26.0–34.0)
MCHC: 31.6 g/dL (ref 30.0–36.0)
MCV: 97.7 fL (ref 80.0–100.0)
Monocytes Absolute: 0.4 10*3/uL (ref 0.1–1.0)
Monocytes Relative: 7 %
Neutro Abs: 3.9 10*3/uL (ref 1.7–7.7)
Neutrophils Relative %: 63 %
Platelet Count: 205 10*3/uL (ref 150–400)
RBC: 3.98 MIL/uL (ref 3.87–5.11)
RDW: 15 % (ref 11.5–15.5)
WBC Count: 6.2 10*3/uL (ref 4.0–10.5)
nRBC: 0 % (ref 0.0–0.2)

## 2024-03-13 LAB — CMP (CANCER CENTER ONLY)
ALT: 6 U/L (ref 0–44)
AST: 19 U/L (ref 15–41)
Albumin: 3.5 g/dL (ref 3.5–5.0)
Alkaline Phosphatase: 104 U/L (ref 38–126)
Anion gap: 11 (ref 5–15)
BUN: 22 mg/dL (ref 8–23)
CO2: 24 mmol/L (ref 22–32)
Calcium: 9 mg/dL (ref 8.9–10.3)
Chloride: 106 mmol/L (ref 98–111)
Creatinine: 1.27 mg/dL — ABNORMAL HIGH (ref 0.44–1.00)
GFR, Estimated: 43 mL/min — ABNORMAL LOW (ref >60)
Glucose, Bld: 98 mg/dL (ref 70–99)
Potassium: 4.2 mmol/L (ref 3.5–5.1)
Sodium: 141 mmol/L (ref 135–145)
Total Bilirubin: 0.3 mg/dL (ref 0.0–1.2)
Total Protein: 6.7 g/dL (ref 6.5–8.1)

## 2024-03-13 MED ORDER — HEPARIN SOD (PORK) LOCK FLUSH 100 UNIT/ML IV SOLN
500.0000 [IU] | Freq: Once | INTRAVENOUS | Status: AC | PRN
  Administered 2024-03-13: 500 [IU]

## 2024-03-13 MED ORDER — SODIUM CHLORIDE 0.9 % IV SOLN
1680.0000 mg | Freq: Once | INTRAVENOUS | Status: AC
  Administered 2024-03-13: 1680 mg via INTRAVENOUS
  Filled 2024-03-13: qty 28

## 2024-03-13 MED ORDER — SODIUM CHLORIDE 0.9 % IV SOLN: Freq: Once | INTRAVENOUS | Status: AC

## 2024-03-13 MED ORDER — LORAZEPAM 0.5 MG PO TABS: 0.5000 mg | ORAL_TABLET | Freq: Two times a day (BID) | ORAL | 1 refills | Status: DC | PRN

## 2024-03-13 MED ORDER — SODIUM CHLORIDE 0.9% FLUSH
10.0000 mL | INTRAVENOUS | Status: DC | PRN
  Administered 2024-03-13: 10 mL

## 2024-03-13 NOTE — Progress Notes (Signed)
 Patient seen by Dr. Thornton Papas today  Vitals are within treatment parameters:Yes   Labs are within treatment parameters: Yes   Treatment plan has been signed: Yes   Per physician team, Patient is ready for treatment and there are NO modifications to the treatment plan.

## 2024-03-13 NOTE — Patient Instructions (Signed)
 [facility] - A DEPT OF . [facility]  Discharge Instructions: Thank you for choosing [facility] to provide your oncology and hematology care.   If you have a lab appointment with the Cancer Center, please go directly to the Cancer Center and check in at the registration area.   Wear comfortable clothing and clothing appropriate for easy access to any Portacath or PICC line.   We strive to give you quality time with your provider. You may need to reschedule your appointment if you arrive late (15 or more minutes).  Arriving late affects you and other patients whose appointments are after yours.  Also, if you miss three or more appointments without notifying the office, you may be dismissed from the clinic at the provider's discretion.      For prescription refill requests, have your pharmacy contact our office and allow 72 hours for refills to be completed.    Today you received the following chemotherapy and/or immunotherapy agents Tecentriq       To help prevent nausea and vomiting after your treatment, we encourage you to take your nausea medication as directed.  BELOW ARE SYMPTOMS THAT SHOULD BE REPORTED IMMEDIATELY: *FEVER GREATER THAN 100.4 F (38 C) OR HIGHER *CHILLS OR SWEATING *NAUSEA AND VOMITING THAT IS NOT CONTROLLED WITH YOUR NAUSEA MEDICATION *UNUSUAL SHORTNESS OF BREATH *UNUSUAL BRUISING OR BLEEDING *URINARY PROBLEMS (pain or burning when urinating, or frequent urination) *BOWEL PROBLEMS (unusual diarrhea, constipation, pain near the anus) TENDERNESS IN MOUTH AND THROAT WITH OR WITHOUT PRESENCE OF ULCERS (sore throat, sores in mouth, or a toothache) UNUSUAL RASH, SWELLING OR PAIN  UNUSUAL VAGINAL DISCHARGE OR ITCHING   Items with * indicate a potential emergency and should be followed up as soon as possible or go to the Emergency Department if any problems should occur.  Please show the CHEMOTHERAPY ALERT CARD or IMMUNOTHERAPY  ALERT CARD at check-in to the Emergency Department and triage nurse.  Should you have questions after your visit or need to cancel or reschedule your appointment, please contact [facility] - A DEPT OF [facility]  Dept: [phone]  and follow the prompts.  Office hours are 8:00 a.m. to 4:30 p.m. Monday - Friday. Please note that voicemails left after 4:00 p.m. may not be returned until the following business day.  We are closed weekends and major holidays. You have access to a nurse at all times for urgent questions. Please call the main number to the clinic Dept: [phone] and follow the prompts.   For any non-urgent questions, you may also contact your provider using MyChart. We now offer e-Visits for anyone 61 and older to request care online for non-urgent symptoms. For details visit [email or web address].   Also download the MyChart app! Go to the app store, search "MyChart", open the app, select [place], and log in with your MyChart username and password.
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
## 2024-03-19 ENCOUNTER — Telehealth: Payer: Self-pay | Admitting: *Deleted

## 2024-03-19 ENCOUNTER — Other Ambulatory Visit: Payer: Self-pay

## 2024-03-19 NOTE — Telephone Encounter (Signed)
 Spoke with Crystal [name] and got her CT moved to 04/08/24 at 1215/1230 per her request.

## 2024-03-20 ENCOUNTER — Other Ambulatory Visit: Payer: Self-pay

## 2024-03-20 ENCOUNTER — Telehealth: Payer: Self-pay

## 2024-03-20 NOTE — Telephone Encounter (Signed)
 The patient contacted the office reporting aching back pain. She has been taking Tylenol  approximately six times daily with no significant relief. She is requesting a prescription for pain medication.

## 2024-03-21 ENCOUNTER — Other Ambulatory Visit: Payer: Self-pay | Admitting: Nurse Practitioner

## 2024-03-21 ENCOUNTER — Telehealth: Payer: Self-pay | Admitting: *Deleted

## 2024-03-21 ENCOUNTER — Other Ambulatory Visit: Payer: Self-pay | Admitting: *Deleted

## 2024-03-21 DIAGNOSIS — C3490 Malignant neoplasm of unspecified part of unspecified bronchus or lung: Secondary | ICD-10-CM

## 2024-03-21 MED ORDER — TRAMADOL HCL 50 MG PO TABS: 50.0000 mg | ORAL_TABLET | Freq: Three times a day (TID) | ORAL | 0 refills | Status: DC | PRN

## 2024-03-21 NOTE — Telephone Encounter (Signed)
 NP agrees to send in script for tramadol . She would like it sent to [facility]. She is certain it will get delivered to facility tomorrow.

## 2024-03-21 NOTE — Telephone Encounter (Signed)
 Discussed pain with Crystal [name]: pain is located between shoulder blades more on right side. Has had this discomfort (mild) for couple weeks, but has become "severe" since 5/28. Denies any recent injury or stress at area. Pain is sore at rest, but becomes sharp with movement. Requesting pain medication and asking if her chest CT will show if anything is going on there?

## 2024-03-21 NOTE — Telephone Encounter (Signed)
 Called patient and informed her we were able to get her CT chest moved to 6/2 at 0915/0930, so she does not need the thoracic spine xrays now. She needs to go to emergency room if she develops any weakness or numbness in her arms or neck or pain gets severe and she agrees. Attempted to get xray and to be seen today, but she has no transportation.

## 2024-03-24 ENCOUNTER — Ambulatory Visit (HOSPITAL_BASED_OUTPATIENT_CLINIC_OR_DEPARTMENT_OTHER)
Admission: RE | Admit: 2024-03-24 | Discharge: 2024-03-24 | Disposition: A | Discharge status: Home / Self Care | Source: Ambulatory Visit | Attending: Oncology | Admitting: Oncology

## 2024-03-24 ENCOUNTER — Telehealth: Payer: Self-pay | Admitting: *Deleted

## 2024-03-24 DIAGNOSIS — C3490 Malignant neoplasm of unspecified part of unspecified bronchus or lung: Secondary | ICD-10-CM | POA: Insufficient documentation

## 2024-03-24 NOTE — Telephone Encounter (Signed)
 Called patient regarding CT results that NP could see: Right lung mass is stable. There have been some changes on right side with some loculated fluid, that most likely can't be drained. Will see her on 03/31/24 at 1:15 pm to review--she agrees to the appointment. Her pain has been well controlled with tramadol  bid.

## 2024-03-31 ENCOUNTER — Other Ambulatory Visit: Payer: Self-pay | Admitting: Nurse Practitioner

## 2024-03-31 ENCOUNTER — Encounter: Payer: Self-pay | Admitting: Nurse Practitioner

## 2024-03-31 ENCOUNTER — Inpatient Hospital Stay: Attending: Oncology | Admitting: Nurse Practitioner

## 2024-03-31 VITALS — BP 115/72 | HR 74 | Temp 98.2°F | Resp 18 | Ht 65.0 in | Wt 213.0 lb

## 2024-03-31 DIAGNOSIS — C3490 Malignant neoplasm of unspecified part of unspecified bronchus or lung: Secondary | ICD-10-CM | POA: Diagnosis not present

## 2024-03-31 DIAGNOSIS — C782 Secondary malignant neoplasm of pleura: Secondary | ICD-10-CM | POA: Insufficient documentation

## 2024-03-31 DIAGNOSIS — G893 Neoplasm related pain (acute) (chronic): Secondary | ICD-10-CM | POA: Insufficient documentation

## 2024-03-31 DIAGNOSIS — C7951 Secondary malignant neoplasm of bone: Secondary | ICD-10-CM | POA: Diagnosis present

## 2024-03-31 DIAGNOSIS — C342 Malignant neoplasm of middle lobe, bronchus or lung: Secondary | ICD-10-CM | POA: Diagnosis present

## 2024-03-31 MED ORDER — OXYCODONE HCL 5 MG PO TABS: 5.0000 mg | ORAL_TABLET | Freq: Four times a day (QID) | ORAL | 0 refills | Status: DC | PRN
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
## 2024-04-04 ENCOUNTER — Encounter (HOSPITAL_COMMUNITY)
Admission: RE | Admit: 2024-04-04 | Discharge: 2024-04-04 | Disposition: A | Discharge status: Home / Self Care | Source: Ambulatory Visit | Attending: Nurse Practitioner | Admitting: Nurse Practitioner

## 2024-04-04 ENCOUNTER — Encounter (HOSPITAL_COMMUNITY): Payer: Self-pay

## 2024-04-04 DIAGNOSIS — C3490 Malignant neoplasm of unspecified part of unspecified bronchus or lung: Secondary | ICD-10-CM | POA: Insufficient documentation

## 2024-04-04 LAB — GLUCOSE, CAPILLARY: Glucose-Capillary: 128 mg/dL — ABNORMAL HIGH (ref 70–99)

## 2024-04-04 MED ORDER — FLUDEOXYGLUCOSE F - 18 (FDG) INJECTION: 10.6500 | Freq: Once | INTRAVENOUS | Status: DC

## 2024-04-05 ENCOUNTER — Other Ambulatory Visit: Payer: Self-pay

## 2024-04-06 ENCOUNTER — Other Ambulatory Visit: Payer: Self-pay

## 2024-04-08 ENCOUNTER — Telehealth: Payer: Self-pay | Admitting: Oncology

## 2024-04-08 ENCOUNTER — Other Ambulatory Visit (HOSPITAL_BASED_OUTPATIENT_CLINIC_OR_DEPARTMENT_OTHER)

## 2024-04-08 NOTE — Telephone Encounter (Signed)
 Tired to contact PT regarding 7/23 appt. Voicemail not set up.

## 2024-04-09 ENCOUNTER — Other Ambulatory Visit: Payer: Self-pay

## 2024-04-10 ENCOUNTER — Other Ambulatory Visit

## 2024-04-10 ENCOUNTER — Other Ambulatory Visit (HOSPITAL_BASED_OUTPATIENT_CLINIC_OR_DEPARTMENT_OTHER)

## 2024-04-10 ENCOUNTER — Ambulatory Visit: Admitting: Oncology

## 2024-04-10 ENCOUNTER — Ambulatory Visit

## 2024-04-14 ENCOUNTER — Other Ambulatory Visit (HOSPITAL_COMMUNITY)

## 2024-04-15 ENCOUNTER — Ambulatory Visit (HOSPITAL_COMMUNITY)
Admission: RE | Admit: 2024-04-15 | Discharge: 2024-04-15 | Disposition: A | Discharge status: Home / Self Care | Source: Ambulatory Visit | Attending: Nurse Practitioner | Admitting: Nurse Practitioner

## 2024-04-15 DIAGNOSIS — C3490 Malignant neoplasm of unspecified part of unspecified bronchus or lung: Secondary | ICD-10-CM | POA: Insufficient documentation

## 2024-04-15 LAB — GLUCOSE, CAPILLARY: Glucose-Capillary: 110 mg/dL — ABNORMAL HIGH (ref 70–99)

## 2024-04-15 MED ORDER — FLUDEOXYGLUCOSE F - 18 (FDG) INJECTION
10.5500 | Freq: Once | INTRAVENOUS | Status: AC | PRN
  Administered 2024-04-15: 10.55 via INTRAVENOUS

## 2024-04-16 ENCOUNTER — Inpatient Hospital Stay

## 2024-04-16 ENCOUNTER — Inpatient Hospital Stay: Admitting: Oncology

## 2024-04-16 ENCOUNTER — Other Ambulatory Visit (HOSPITAL_BASED_OUTPATIENT_CLINIC_OR_DEPARTMENT_OTHER): Payer: Self-pay

## 2024-04-16 ENCOUNTER — Other Ambulatory Visit: Payer: Self-pay | Admitting: Oncology

## 2024-04-16 VITALS — BP 133/89 | HR 100 | Temp 98.1°F | Resp 18 | Ht 65.0 in | Wt 208.0 lb

## 2024-04-16 DIAGNOSIS — C342 Malignant neoplasm of middle lobe, bronchus or lung: Secondary | ICD-10-CM

## 2024-04-16 DIAGNOSIS — C3490 Malignant neoplasm of unspecified part of unspecified bronchus or lung: Secondary | ICD-10-CM | POA: Diagnosis not present

## 2024-04-16 DIAGNOSIS — C7951 Secondary malignant neoplasm of bone: Secondary | ICD-10-CM | POA: Diagnosis not present

## 2024-04-16 DIAGNOSIS — Z95828 Presence of other vascular implants and grafts: Secondary | ICD-10-CM

## 2024-04-16 LAB — CBC WITH DIFFERENTIAL (CANCER CENTER ONLY)
Abs Immature Granulocytes: 0 10*3/uL (ref 0.00–0.07)
Basophils Absolute: 0 10*3/uL (ref 0.0–0.1)
Basophils Relative: 0 %
Eosinophils Absolute: 0 10*3/uL (ref 0.0–0.5)
Eosinophils Relative: 0 %
HCT: 42 % (ref 36.0–46.0)
Hemoglobin: 13.2 g/dL (ref 12.0–15.0)
Immature Granulocytes: 0 %
Lymphocytes Relative: 34 %
Lymphs Abs: 1.8 10*3/uL (ref 0.7–4.0)
MCH: 30.2 pg (ref 26.0–34.0)
MCHC: 31.4 g/dL (ref 30.0–36.0)
MCV: 96.1 fL (ref 80.0–100.0)
Monocytes Absolute: 0.4 10*3/uL (ref 0.1–1.0)
Monocytes Relative: 7 %
Neutro Abs: 3.1 10*3/uL (ref 1.7–7.7)
Neutrophils Relative %: 59 %
Platelet Count: 203 10*3/uL (ref 150–400)
RBC: 4.37 MIL/uL (ref 3.87–5.11)
RDW: 15.2 % (ref 11.5–15.5)
WBC Count: 5.4 10*3/uL (ref 4.0–10.5)
nRBC: 0 % (ref 0.0–0.2)

## 2024-04-16 LAB — CMP (CANCER CENTER ONLY)
ALT: 9 U/L (ref 0–44)
AST: 20 U/L (ref 15–41)
Albumin: 3.6 g/dL (ref 3.5–5.0)
Alkaline Phosphatase: 107 U/L (ref 38–126)
Anion gap: 9 (ref 5–15)
BUN: 18 mg/dL (ref 8–23)
CO2: 27 mmol/L (ref 22–32)
Calcium: 9.2 mg/dL (ref 8.9–10.3)
Chloride: 105 mmol/L (ref 98–111)
Creatinine: 1.16 mg/dL — ABNORMAL HIGH (ref 0.44–1.00)
GFR, Estimated: 48 mL/min — ABNORMAL LOW (ref >60)
Glucose, Bld: 122 mg/dL — ABNORMAL HIGH (ref 70–99)
Potassium: 3.8 mmol/L (ref 3.5–5.1)
Sodium: 141 mmol/L (ref 135–145)
Total Bilirubin: 0.4 mg/dL (ref 0.0–1.2)
Total Protein: 7.1 g/dL (ref 6.5–8.1)

## 2024-04-16 MED ORDER — LIDOCAINE-PRILOCAINE 2.5-2.5 % EX CREA
1.0000 | TOPICAL_CREAM | CUTANEOUS | 1 refills | Status: DC
  Filled 2024-04-16: qty 30, 30d supply, fill #0

## 2024-04-16 MED ORDER — OXYCODONE HCL 5 MG PO TABS
5.0000 mg | ORAL_TABLET | Freq: Four times a day (QID) | ORAL | 0 refills | Status: DC | PRN
Start: 2024-04-16 — End: 2024-04-17
  Filled 2024-04-16: qty 60, 8d supply, fill #0

## 2024-04-16 MED ORDER — SODIUM CHLORIDE 0.9% FLUSH
10.0000 mL | Freq: Once | INTRAVENOUS | Status: AC
  Administered 2024-04-16: 10 mL

## 2024-04-16 MED ORDER — HEPARIN SOD (PORK) LOCK FLUSH 100 UNIT/ML IV SOLN
500.0000 [IU] | Freq: Once | INTRAVENOUS | Status: AC
  Administered 2024-04-16: 500 [IU] via INTRAVENOUS
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
## 2024-04-16 NOTE — Progress Notes (Signed)
 DISCONTINUE ON PATHWAY REGIMEN - Non-Small Cell Lung     A cycle is every 21 days:     Pemetrexed       Carboplatin    **Always confirm dose/schedule in your pharmacy ordering system**  PRIOR TREATMENT: LOS400: Carboplatin  AUC=5 + Pemetrexed  500 mg/m2 q21 Days x 1 Cycle  START OFF PATHWAY REGIMEN - Non-Small Cell Lung   [id number]:Gemcitabine 1,000 mg/m2 IV D1,8,15 q28 Days:   A cycle is every 28 days:     Gemcitabine   **Always confirm dose/schedule in your pharmacy ordering system**  Patient Characteristics: Stage IV Metastatic, Nonsquamous, Awaiting Molecular Test Results and Need to Start Chemotherapy, PS = 0, 1 Therapeutic Status: Stage IV Metastatic Histology: Nonsquamous Cell Broad Molecular Profiling Status: Awaiting Molecular Test Results and Need to Start Chemotherapy ECOG Performance Status: 1 Intent of Therapy: Non-Curative / Palliative Intent, Discussed with Patient

## 2024-04-16 NOTE — Progress Notes (Signed)
 Provided Sahaana reading information on single agent Gemzar every 2 weeks.

## 2024-04-17 ENCOUNTER — Telehealth: Payer: Self-pay

## 2024-04-17 ENCOUNTER — Other Ambulatory Visit (HOSPITAL_COMMUNITY): Payer: Self-pay

## 2024-04-17 ENCOUNTER — Other Ambulatory Visit (HOSPITAL_BASED_OUTPATIENT_CLINIC_OR_DEPARTMENT_OTHER): Payer: Self-pay

## 2024-04-17 ENCOUNTER — Other Ambulatory Visit: Payer: Self-pay | Admitting: Oncology

## 2024-04-17 MED ORDER — HYDROMORPHONE HCL 4 MG PO TABS
4.0000 mg | ORAL_TABLET | ORAL | 0 refills | Status: DC | PRN
  Filled 2024-04-17: qty 60, 10d supply, fill #0

## 2024-04-17 NOTE — Telephone Encounter (Signed)
 The patient contacted the office to report that her pain has worsened. She states that the oxycodone  is no longer effective and is experiencing significant discomfort. She is currently taking oxycodone  every three hours without noticeable relief.

## 2024-04-21 ENCOUNTER — Other Ambulatory Visit: Payer: Self-pay | Admitting: Nurse Practitioner

## 2024-04-21 ENCOUNTER — Telehealth: Payer: Self-pay | Admitting: Nurse Practitioner

## 2024-04-21 ENCOUNTER — Other Ambulatory Visit: Payer: Self-pay | Admitting: Oncology

## 2024-04-21 ENCOUNTER — Other Ambulatory Visit: Payer: Self-pay

## 2024-04-21 DIAGNOSIS — D6481 Anemia due to antineoplastic chemotherapy: Secondary | ICD-10-CM

## 2024-04-21 NOTE — Telephone Encounter (Signed)
 Patient has been scheduled for follow-up visit per 04/21/24 LOS.  Pt noted appt details on personal planner/calendar.

## 2024-04-24 ENCOUNTER — Ambulatory Visit

## 2024-04-24 ENCOUNTER — Inpatient Hospital Stay: Attending: Oncology

## 2024-04-24 VITALS — BP 130/75 | HR 79 | Temp 98.0°F | Resp 16 | Ht 65.0 in | Wt 206.4 lb

## 2024-04-24 DIAGNOSIS — Z79899 Other long term (current) drug therapy: Secondary | ICD-10-CM | POA: Insufficient documentation

## 2024-04-24 DIAGNOSIS — T451X5A Adverse effect of antineoplastic and immunosuppressive drugs, initial encounter: Secondary | ICD-10-CM

## 2024-04-24 DIAGNOSIS — C3490 Malignant neoplasm of unspecified part of unspecified bronchus or lung: Secondary | ICD-10-CM

## 2024-04-24 DIAGNOSIS — Z5112 Encounter for antineoplastic immunotherapy: Secondary | ICD-10-CM | POA: Diagnosis present

## 2024-04-24 DIAGNOSIS — C342 Malignant neoplasm of middle lobe, bronchus or lung: Secondary | ICD-10-CM | POA: Diagnosis present

## 2024-04-24 DIAGNOSIS — C7951 Secondary malignant neoplasm of bone: Secondary | ICD-10-CM | POA: Diagnosis present

## 2024-04-24 LAB — CMP (CANCER CENTER ONLY)
ALT: 9 U/L (ref 0–44)
AST: 21 U/L (ref 15–41)
Albumin: 3.5 g/dL (ref 3.5–5.0)
Alkaline Phosphatase: 101 U/L (ref 38–126)
Anion gap: 10 (ref 5–15)
BUN: 27 mg/dL — ABNORMAL HIGH (ref 8–23)
CO2: 26 mmol/L (ref 22–32)
Calcium: 9.5 mg/dL (ref 8.9–10.3)
Chloride: 106 mmol/L (ref 98–111)
Creatinine: 1.37 mg/dL — ABNORMAL HIGH (ref 0.44–1.00)
GFR, Estimated: 39 mL/min — ABNORMAL LOW (ref >60)
Glucose, Bld: 107 mg/dL — ABNORMAL HIGH (ref 70–99)
Potassium: 4.6 mmol/L (ref 3.5–5.1)
Sodium: 142 mmol/L (ref 135–145)
Total Bilirubin: 0.4 mg/dL (ref 0.0–1.2)
Total Protein: 7.1 g/dL (ref 6.5–8.1)

## 2024-04-24 LAB — CBC WITH DIFFERENTIAL (CANCER CENTER ONLY)
Abs Immature Granulocytes: 0.01 10*3/uL (ref 0.00–0.07)
Basophils Absolute: 0 10*3/uL (ref 0.0–0.1)
Basophils Relative: 1 %
Eosinophils Absolute: 0 10*3/uL (ref 0.0–0.5)
Eosinophils Relative: 1 %
HCT: 40.2 % (ref 36.0–46.0)
Hemoglobin: 12.6 g/dL (ref 12.0–15.0)
Immature Granulocytes: 0 %
Lymphocytes Relative: 30 %
Lymphs Abs: 1.7 10*3/uL (ref 0.7–4.0)
MCH: 30.9 pg (ref 26.0–34.0)
MCHC: 31.3 g/dL (ref 30.0–36.0)
MCV: 98.5 fL (ref 80.0–100.0)
Monocytes Absolute: 0.5 10*3/uL (ref 0.1–1.0)
Monocytes Relative: 9 %
Neutro Abs: 3.3 10*3/uL (ref 1.7–7.7)
Neutrophils Relative %: 59 %
Platelet Count: 205 10*3/uL (ref 150–400)
RBC: 4.08 MIL/uL (ref 3.87–5.11)
RDW: 15.3 % (ref 11.5–15.5)
WBC Count: 5.5 10*3/uL (ref 4.0–10.5)
nRBC: 0 % (ref 0.0–0.2)

## 2024-04-24 MED ORDER — SODIUM CHLORIDE 0.9% FLUSH
10.0000 mL | INTRAVENOUS | Status: DC | PRN
  Administered 2024-04-24: 10 mL

## 2024-04-24 MED ORDER — HEPARIN SOD (PORK) LOCK FLUSH 100 UNIT/ML IV SOLN
500.0000 [IU] | Freq: Once | INTRAVENOUS | Status: AC | PRN
  Administered 2024-04-24: 500 [IU]

## 2024-04-24 MED ORDER — SODIUM CHLORIDE 0.9 % IV SOLN
1000.0000 mg | Freq: Once | INTRAVENOUS | Status: AC
  Administered 2024-04-24: 1000 mg via INTRAVENOUS
  Filled 2024-04-24: qty 26.3

## 2024-04-24 MED ORDER — SODIUM CHLORIDE 0.9 % IV SOLN: INTRAVENOUS | Status: DC

## 2024-04-24 MED ORDER — PROCHLORPERAZINE MALEATE 10 MG PO TABS
10.0000 mg | ORAL_TABLET | Freq: Once | ORAL | Status: AC
  Administered 2024-04-24: 10 mg via ORAL
  Filled 2024-04-24: qty 1

## 2024-04-24 NOTE — Progress Notes (Signed)
 Patient presents today for first chemotherapy infusion of Gemzar. Patient is in satisfactory condition with no new complaints voiced.  Vital signs are stable.  Labs reviewed and all labs are within treatment parameters.  We will proceed with treatment per MD orders.

## 2024-04-24 NOTE — Patient Instructions (Signed)
 [facility] A DEPT OF [facility]  Discharge Instructions: Thank you for choosing [facility] to provide your oncology and hematology care.   If you have a lab appointment with the Cancer Center, please go directly to the Cancer Center and check in at the registration area.   Wear comfortable clothing and clothing appropriate for easy access to any Portacath or PICC line.   We strive to give you quality time with your provider. You may need to reschedule your appointment if you arrive late (15 or more minutes).  Arriving late affects you and other patients whose appointments are after yours.  Also, if you miss three or more appointments without notifying the office, you may be dismissed from the clinic at the provider's discretion.      For prescription refill requests, have your pharmacy contact our office and allow 72 hours for refills to be completed.    Today you received the following chemotherapy and/or immunotherapy agents Gemzar.  Gemcitabine Injection What is this medication? GEMCITABINE (jem SYE ta been) treats some types of cancer. It works by slowing down the growth of cancer cells. This medicine may be used for other purposes; ask your health care provider or pharmacist if you have questions. COMMON BRAND NAME(S): Gemzar, Infugem What should I tell my care team before I take this medication? They need to know if you have any of these conditions: Blood disorders Infection Kidney disease Liver disease Lung or breathing disease, such as asthma or COPD Recent or ongoing radiation therapy An unusual or allergic reaction to gemcitabine, other medications, foods, dyes, or preservatives If you or your partner are pregnant or trying to get pregnant Breast-feeding How should I use this medication? This medication is injected into a vein. It is given by your care team in a hospital or clinic setting. Talk to your care team about the  use of this medication in children. Special care may be needed. Overdosage: If you think you have taken too much of this medicine contact a poison control center or emergency room at once. NOTE: This medicine is only for you. Do not share this medicine with others. What if I miss a dose? Keep appointments for follow-up doses. It is important not to miss your dose. Call your care team if you are unable to keep an appointment. What may interact with this medication? Interactions have not been studied. This list may not describe all possible interactions. Give your health care provider a list of all the medicines, herbs, non-prescription drugs, or dietary supplements you use. Also tell them if you smoke, drink alcohol, or use illegal drugs. Some items may interact with your medicine. What should I watch for while using this medication? Your condition will be monitored carefully while you are receiving this medication. This medication may make you feel generally unwell. This is not uncommon, as chemotherapy can affect healthy cells as well as cancer cells. Report any side effects. Continue your course of treatment even though you feel ill unless your care team tells you to stop. In some cases, you may be given additional medications to help with side effects. Follow all directions for their use. This medication may increase your risk of getting an infection. Call your care team for advice if you get a fever, chills, sore throat, or other symptoms of a cold or flu. Do not treat yourself. Try to avoid being around people who are sick. This medication may increase  your risk to bruise or bleed. Call your care team if you notice any unusual bleeding. Be careful brushing or flossing your teeth or using a toothpick because you may get an infection or bleed more easily. If you have any dental work done, tell your dentist you are receiving this medication. Avoid taking medications that contain aspirin,  acetaminophen, ibuprofen, naproxen, or ketoprofen unless instructed by your care team. These medications may hide a fever. Talk to your care team if you or your partner wish to become pregnant or think you might be pregnant. This medication can cause serious birth defects if taken during pregnancy and for 6 months after the last dose. A negative pregnancy test is required before starting this medication. A reliable form of contraception is recommended while taking this medication and for 6 months after the last dose. Talk to your care team about effective forms of contraception. Do not father a child while taking this medication and for 3 months after the last dose. Use a condom while having sex during this time period. Do not breastfeed while taking this medication and for at least 1 week after the last dose. This medication may cause infertility. Talk to your care team if you are concerned about your fertility. What side effects may I notice from receiving this medication? Side effects that you should report to your care team as soon as possible: Allergic reactions--skin rash, itching, hives, swelling of the face, lips, tongue, or throat Capillary leak syndrome--stomach or muscle pain, unusual weakness or fatigue, feeling faint or lightheaded, decrease in the amount of urine, swelling of the ankles, hands, or feet, trouble breathing Infection--fever, chills, cough, sore throat, wounds that don't heal, pain or trouble when passing urine, general feeling of discomfort or being unwell Liver injury--right upper belly pain, loss of appetite, nausea, light-colored stool, dark yellow or brown urine, yellowing skin or eyes, unusual weakness or fatigue Low red blood cell level--unusual weakness or fatigue, dizziness, headache, trouble breathing Lung injury--shortness of breath or trouble breathing, cough, spitting up blood, chest pain, fever Stomach pain, bloody diarrhea, pale skin, unusual weakness or fatigue,  decrease in the amount of urine, which may be signs of hemolytic uremic syndrome Sudden and severe headache, confusion, change in vision, seizures, which may be signs of posterior reversible encephalopathy syndrome (PRES) Unusual bruising or bleeding Side effects that usually do not require medical attention (report to your care team if they continue or are bothersome): Diarrhea Drowsiness Hair loss Nausea Pain, redness, or swelling with sores inside the mouth or throat Vomiting This list may not describe all possible side effects. Call your doctor for medical advice about side effects. You may report side effects to FDA at 1-800-FDA-1088. Where should I keep my medication? This medication is given in a hospital or clinic. It will not be stored at home. NOTE: This sheet is a summary. It may not cover all possible information. If you have questions about this medicine, talk to your doctor, pharmacist, or health care provider.  2024 Elsevier/Gold Standard (2022-02-14 00:00:00)      To help prevent nausea and vomiting after your treatment, we encourage you to take your nausea medication as directed.  BELOW ARE SYMPTOMS THAT SHOULD BE REPORTED IMMEDIATELY: *FEVER GREATER THAN 100.4 F (38 C) OR HIGHER *CHILLS OR SWEATING *NAUSEA AND VOMITING THAT IS NOT CONTROLLED WITH YOUR NAUSEA MEDICATION *UNUSUAL SHORTNESS OF BREATH *UNUSUAL BRUISING OR BLEEDING *URINARY PROBLEMS (pain or burning when urinating, or frequent urination) *BOWEL PROBLEMS (unusual diarrhea,  constipation, pain near the anus) TENDERNESS IN MOUTH AND THROAT WITH OR WITHOUT PRESENCE OF ULCERS (sore throat, sores in mouth, or a toothache) UNUSUAL RASH, SWELLING OR PAIN  UNUSUAL VAGINAL DISCHARGE OR ITCHING   Items with * indicate a potential emergency and should be followed up as soon as possible or go to the Emergency Department if any problems should occur.  Please show the CHEMOTHERAPY ALERT CARD or IMMUNOTHERAPY ALERT CARD  at check-in to the Emergency Department and triage nurse.  Should you have questions after your visit or need to cancel or reschedule your appointment, please contact [facility] DE[facility]  Dept: [phone]  and follow the prompts.  Office hours are 8:00 a.m. to 4:30 p.m. Monday - Friday. Please note that voicemails left after 4:00 p.m. may not be returned until the following business day.  We are closed weekends and major holidays. You have access to a nurse at all times for urgent questions. Please call the main number to the clinic Dept: [phone] and follow the prompts.   For any non-urgent questions, you may also contact your provider using MyChart. We now offer e-Visits for anyone 39 and older to request care online for non-urgent symptoms. For details visit [email or web address].   Also download the MyChart app! Go to the app store, search "MyChart", open the app, select [place], and log in with your MyChart username and password.

## 2024-04-28 ENCOUNTER — Telehealth: Payer: Self-pay

## 2024-04-28 NOTE — Telephone Encounter (Signed)
 The patient called the advice line on April 26, 2023. I returned the call and spoke with the patient, who reported that she is doing well. She mentioned that she experienced a loss of taste following chemotherapy. I explained that taste changes are a common side effect of chemotherapy and that food may taste different. I recommended trying lemon drops or ginger candies, which can help improve taste perception.

## 2024-04-29 ENCOUNTER — Telehealth: Payer: Self-pay

## 2024-04-29 ENCOUNTER — Telehealth: Payer: Self-pay | Admitting: *Deleted

## 2024-04-29 ENCOUNTER — Other Ambulatory Visit (HOSPITAL_BASED_OUTPATIENT_CLINIC_OR_DEPARTMENT_OTHER): Payer: Self-pay

## 2024-04-29 MED ORDER — MAGIC MOUTHWASH: 5.0000 mL | Freq: Four times a day (QID) | ORAL | 1 refills | Status: DC | PRN

## 2024-04-29 NOTE — Telephone Encounter (Signed)
 Crystal [name] called to report she has mouth sores and burning. Denies any thrush on tongue or mucous membranes. Will send in script for MMW and she will pick up tomorrow. Also instructed her to rinse mouth several times daily with warm water  and 1/4 tsp baking soda and 1/2 tsp salt. Avoid hot beverages, acidic or rough foods and carbonation.

## 2024-04-29 NOTE — Telephone Encounter (Signed)
 First chemo callback  Called to check on patient after her 04/24/24 visit for chemo infusion. She reports feeling well today. Did have altered taste (see telephone encounter 04/28/24). Reminded her to call our office if she has any questions.

## 2024-04-30 ENCOUNTER — Other Ambulatory Visit (HOSPITAL_BASED_OUTPATIENT_CLINIC_OR_DEPARTMENT_OTHER): Payer: Self-pay

## 2024-04-30 MED ORDER — NYSTATIN 100000 UNIT/ML MT SUSP
5.0000 mL | Freq: Four times a day (QID) | OROMUCOSAL | 1 refills | Status: DC | PRN
  Filled 2024-04-30: qty 240, 12d supply, fill #0
  Filled 2024-05-03: qty 240, 11d supply, fill #1
  Filled 2024-05-03: qty 240, 10d supply, fill #1

## 2024-05-01 ENCOUNTER — Other Ambulatory Visit (HOSPITAL_BASED_OUTPATIENT_CLINIC_OR_DEPARTMENT_OTHER): Payer: Self-pay

## 2024-05-03 ENCOUNTER — Encounter: Payer: Self-pay | Admitting: Oncology

## 2024-05-03 ENCOUNTER — Other Ambulatory Visit (HOSPITAL_BASED_OUTPATIENT_CLINIC_OR_DEPARTMENT_OTHER): Payer: Self-pay

## 2024-05-04 ENCOUNTER — Other Ambulatory Visit: Payer: Self-pay | Admitting: Oncology

## 2024-05-08 ENCOUNTER — Encounter: Payer: Self-pay | Admitting: Nurse Practitioner

## 2024-05-08 ENCOUNTER — Telehealth: Payer: Self-pay

## 2024-05-08 ENCOUNTER — Inpatient Hospital Stay

## 2024-05-08 ENCOUNTER — Inpatient Hospital Stay (HOSPITAL_BASED_OUTPATIENT_CLINIC_OR_DEPARTMENT_OTHER): Admitting: Nurse Practitioner

## 2024-05-08 ENCOUNTER — Other Ambulatory Visit (HOSPITAL_BASED_OUTPATIENT_CLINIC_OR_DEPARTMENT_OTHER): Payer: Self-pay

## 2024-05-08 ENCOUNTER — Inpatient Hospital Stay (HOSPITAL_BASED_OUTPATIENT_CLINIC_OR_DEPARTMENT_OTHER)

## 2024-05-08 ENCOUNTER — Other Ambulatory Visit

## 2024-05-08 ENCOUNTER — Ambulatory Visit: Admitting: Nurse Practitioner

## 2024-05-08 ENCOUNTER — Ambulatory Visit

## 2024-05-08 VITALS — BP 103/89 | HR 88 | Temp 98.0°F | Resp 18 | Wt 202.9 lb

## 2024-05-08 VITALS — BP 96/66 | HR 96 | Resp 16

## 2024-05-08 DIAGNOSIS — C3490 Malignant neoplasm of unspecified part of unspecified bronchus or lung: Secondary | ICD-10-CM | POA: Diagnosis not present

## 2024-05-08 DIAGNOSIS — Z5112 Encounter for antineoplastic immunotherapy: Secondary | ICD-10-CM | POA: Diagnosis not present

## 2024-05-08 DIAGNOSIS — C342 Malignant neoplasm of middle lobe, bronchus or lung: Secondary | ICD-10-CM

## 2024-05-08 LAB — CBC WITH DIFFERENTIAL (CANCER CENTER ONLY)
Abs Immature Granulocytes: 0.01 K/uL (ref 0.00–0.07)
Basophils Absolute: 0 K/uL (ref 0.0–0.1)
Basophils Relative: 0 %
Eosinophils Absolute: 0 K/uL (ref 0.0–0.5)
Eosinophils Relative: 1 %
HCT: 37.9 % (ref 36.0–46.0)
Hemoglobin: 11.9 g/dL — ABNORMAL LOW (ref 12.0–15.0)
Immature Granulocytes: 0 %
Lymphocytes Relative: 34 %
Lymphs Abs: 1.4 K/uL (ref 0.7–4.0)
MCH: 30.8 pg (ref 26.0–34.0)
MCHC: 31.4 g/dL (ref 30.0–36.0)
MCV: 98.2 fL (ref 80.0–100.0)
Monocytes Absolute: 0.4 K/uL (ref 0.1–1.0)
Monocytes Relative: 9 %
Neutro Abs: 2.2 K/uL (ref 1.7–7.7)
Neutrophils Relative %: 56 %
Platelet Count: 187 K/uL (ref 150–400)
RBC: 3.86 MIL/uL — ABNORMAL LOW (ref 3.87–5.11)
RDW: 15.6 % — ABNORMAL HIGH (ref 11.5–15.5)
WBC Count: 4 K/uL (ref 4.0–10.5)
nRBC: 0 % (ref 0.0–0.2)

## 2024-05-08 LAB — CMP (CANCER CENTER ONLY)
ALT: 18 U/L (ref 0–44)
AST: 27 U/L (ref 15–41)
Albumin: 3.6 g/dL (ref 3.5–5.0)
Alkaline Phosphatase: 100 U/L (ref 38–126)
Anion gap: 10 (ref 5–15)
BUN: 12 mg/dL (ref 8–23)
CO2: 25 mmol/L (ref 22–32)
Calcium: 9.1 mg/dL (ref 8.9–10.3)
Chloride: 105 mmol/L (ref 98–111)
Creatinine: 1.23 mg/dL — ABNORMAL HIGH (ref 0.44–1.00)
GFR, Estimated: 44 mL/min — ABNORMAL LOW (ref >60)
Glucose, Bld: 109 mg/dL — ABNORMAL HIGH (ref 70–99)
Potassium: 4.2 mmol/L (ref 3.5–5.1)
Sodium: 140 mmol/L (ref 135–145)
Total Bilirubin: 0.4 mg/dL (ref 0.0–1.2)
Total Protein: 6.9 g/dL (ref 6.5–8.1)

## 2024-05-08 MED ORDER — PROCHLORPERAZINE MALEATE 10 MG PO TABS
10.0000 mg | ORAL_TABLET | Freq: Once | ORAL | Status: AC
  Administered 2024-05-08: 10 mg via ORAL
  Filled 2024-05-08: qty 1

## 2024-05-08 MED ORDER — NYSTATIN 100000 UNIT/ML MT SUSP
5.0000 mL | Freq: Three times a day (TID) | OROMUCOSAL | 1 refills | Status: DC
  Filled 2024-05-08: qty 240, 16d supply, fill #0

## 2024-05-08 MED ORDER — SODIUM CHLORIDE 0.9% FLUSH
10.0000 mL | INTRAVENOUS | Status: DC | PRN
  Administered 2024-05-08: 10 mL

## 2024-05-08 MED ORDER — HEPARIN SOD (PORK) LOCK FLUSH 100 UNIT/ML IV SOLN
500.0000 [IU] | Freq: Once | INTRAVENOUS | Status: AC | PRN
Start: 2024-05-08 — End: 2024-05-08
  Administered 2024-05-08: 500 [IU]

## 2024-05-08 MED ORDER — SODIUM CHLORIDE 0.9 % IV SOLN
800.0000 mg | Freq: Once | INTRAVENOUS | Status: AC
  Administered 2024-05-08: 800 mg via INTRAVENOUS
  Filled 2024-05-08: qty 21.04

## 2024-05-08 MED ORDER — SODIUM CHLORIDE 0.9 % IV SOLN: INTRAVENOUS | Status: DC

## 2024-05-08 MED ORDER — OXYCODONE HCL 5 MG PO TABS
5.0000 mg | ORAL_TABLET | ORAL | 0 refills | Status: DC | PRN
  Filled 2024-05-08: qty 30, 5d supply, fill #0

## 2024-05-08 NOTE — Progress Notes (Signed)
 Patient seen by Olam Ned NP today  Vitals are within treatment parameters:Yes   Labs are within treatment parameters: Yes   Treatment plan has been signed: Yes   Per physician team, Patient is ready for treatment. Please note the following modifications:reduce the gemcitabine  dose
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
## 2024-05-08 NOTE — Telephone Encounter (Signed)
 Received a telephone call from the patient's sister regarding the scheduled appointment on 05/23/2024. She expressed concern that the original appointment time of 0800 was too early and requested a later start time.  The patient's appointments were rescheduled as follows:  Lab: 1000  Port Flush: 1015  Office Visit (OV): 1045  Infusion: 1130  The patient's sister reviewed and verbalized agreement with the updated schedule.

## 2024-05-08 NOTE — Patient Instructions (Signed)
 [facility] - A DEPT OF [facility]  Discharge Instructions: Thank you for choosing [facility] to provide your oncology and hematology care.   If you have a lab appointment with the Cancer Center, please go directly to the Cancer Center and check in at the registration area.   Wear comfortable clothing and clothing appropriate for easy access to any Portacath or PICC line.   We strive to give you quality time with your provider. You may need to reschedule your appointment if you arrive late (15 or more minutes).  Arriving late affects you and other patients whose appointments are after yours.  Also, if you miss three or more appointments without notifying the office, you may be dismissed from the clinic at the provider's discretion.      For prescription refill requests, have your pharmacy contact our office and allow 72 hours for refills to be completed.    Today you received the following chemotherapy and/or immunotherapy agents: Gemcitabine  (Gemzar )      To help prevent nausea and vomiting after your treatment, we encourage you to take your nausea medication as directed.  BELOW ARE SYMPTOMS THAT SHOULD BE REPORTED IMMEDIATELY: *FEVER GREATER THAN 100.4 F (38 C) OR HIGHER *CHILLS OR SWEATING *NAUSEA AND VOMITING THAT IS NOT CONTROLLED WITH YOUR NAUSEA MEDICATION *UNUSUAL SHORTNESS OF BREATH *UNUSUAL BRUISING OR BLEEDING *URINARY PROBLEMS (pain or burning when urinating, or frequent urination) *BOWEL PROBLEMS (unusual diarrhea, constipation, pain near the anus) TENDERNESS IN MOUTH AND THROAT WITH OR WITHOUT PRESENCE OF ULCERS (sore throat, sores in mouth, or a toothache) UNUSUAL RASH, SWELLING OR PAIN  UNUSUAL VAGINAL DISCHARGE OR ITCHING   Items with * indicate a potential emergency and should be followed up as soon as possible or go to the Emergency Department if any problems should occur.  Please show the CHEMOTHERAPY ALERT CARD or  IMMUNOTHERAPY ALERT CARD at check-in to the Emergency Department and triage nurse.  Should you have questions after your visit or need to cancel or reschedule your appointment, please contact [facility]  Dept: [phone]  and follow the prompts.  Office hours are 8:00 a.m. to 4:30 p.m. Monday - Friday. Please note that voicemails left after 4:00 p.m. may not be returned until the following business day.  We are closed weekends and major holidays. You have access to a nurse at all times for urgent questions. Please call the main number to the clinic Dept: [phone] and follow the prompts.   For any non-urgent questions, you may also contact your provider using MyChart. We now offer e-Visits for anyone 45 and older to request care online for non-urgent symptoms. For details visit [email or web address].   Also download the MyChart app! Go to the app store, search MyChart, open the app, select [place], and log in with your MyChart username and password.

## 2024-05-09 ENCOUNTER — Other Ambulatory Visit: Payer: Self-pay

## 2024-05-10 ENCOUNTER — Other Ambulatory Visit: Payer: Self-pay

## 2024-05-15 ENCOUNTER — Ambulatory Visit: Admitting: Nurse Practitioner

## 2024-05-15 ENCOUNTER — Other Ambulatory Visit

## 2024-05-15 ENCOUNTER — Ambulatory Visit

## 2024-05-17 ENCOUNTER — Other Ambulatory Visit: Payer: Self-pay | Admitting: Oncology

## 2024-05-23 ENCOUNTER — Inpatient Hospital Stay

## 2024-05-23 ENCOUNTER — Inpatient Hospital Stay: Attending: Oncology

## 2024-05-23 ENCOUNTER — Ambulatory Visit: Admitting: Nurse Practitioner

## 2024-05-23 ENCOUNTER — Encounter: Payer: Self-pay | Admitting: Nurse Practitioner

## 2024-05-23 ENCOUNTER — Inpatient Hospital Stay: Admitting: Nurse Practitioner

## 2024-05-23 VITALS — BP 110/69 | HR 102 | Resp 18

## 2024-05-23 VITALS — BP 123/90 | HR 100 | Temp 98.1°F | Resp 17 | Ht 65.0 in | Wt 201.0 lb

## 2024-05-23 DIAGNOSIS — C3490 Malignant neoplasm of unspecified part of unspecified bronchus or lung: Secondary | ICD-10-CM

## 2024-05-23 DIAGNOSIS — C7951 Secondary malignant neoplasm of bone: Secondary | ICD-10-CM | POA: Diagnosis present

## 2024-05-23 DIAGNOSIS — C342 Malignant neoplasm of middle lobe, bronchus or lung: Secondary | ICD-10-CM

## 2024-05-23 DIAGNOSIS — Z452 Encounter for adjustment and management of vascular access device: Secondary | ICD-10-CM

## 2024-05-23 DIAGNOSIS — Z5111 Encounter for antineoplastic chemotherapy: Secondary | ICD-10-CM | POA: Diagnosis present

## 2024-05-23 LAB — CBC WITH DIFFERENTIAL (CANCER CENTER ONLY)
Abs Immature Granulocytes: 0.02 K/uL (ref 0.00–0.07)
Basophils Absolute: 0 K/uL (ref 0.0–0.1)
Basophils Relative: 0 %
Eosinophils Absolute: 0 K/uL (ref 0.0–0.5)
Eosinophils Relative: 0 %
HCT: 37.1 % (ref 36.0–46.0)
Hemoglobin: 11.8 g/dL — ABNORMAL LOW (ref 12.0–15.0)
Immature Granulocytes: 0 %
Lymphocytes Relative: 28 %
Lymphs Abs: 1.3 K/uL (ref 0.7–4.0)
MCH: 31.7 pg (ref 26.0–34.0)
MCHC: 31.8 g/dL (ref 30.0–36.0)
MCV: 99.7 fL (ref 80.0–100.0)
Monocytes Absolute: 0.4 K/uL (ref 0.1–1.0)
Monocytes Relative: 10 %
Neutro Abs: 2.9 K/uL (ref 1.7–7.7)
Neutrophils Relative %: 62 %
Platelet Count: 219 K/uL (ref 150–400)
RBC: 3.72 MIL/uL — ABNORMAL LOW (ref 3.87–5.11)
RDW: 17.5 % — ABNORMAL HIGH (ref 11.5–15.5)
WBC Count: 4.6 K/uL (ref 4.0–10.5)
nRBC: 0 % (ref 0.0–0.2)

## 2024-05-23 LAB — CMP (CANCER CENTER ONLY)
ALT: 20 U/L (ref 0–44)
AST: 35 U/L (ref 15–41)
Albumin: 3.3 g/dL — ABNORMAL LOW (ref 3.5–5.0)
Alkaline Phosphatase: 100 U/L (ref 38–126)
Anion gap: 10 (ref 5–15)
BUN: 12 mg/dL (ref 8–23)
CO2: 25 mmol/L (ref 22–32)
Calcium: 9 mg/dL (ref 8.9–10.3)
Chloride: 105 mmol/L (ref 98–111)
Creatinine: 1.22 mg/dL — ABNORMAL HIGH (ref 0.44–1.00)
GFR, Estimated: 45 mL/min — ABNORMAL LOW (ref >60)
Glucose, Bld: 102 mg/dL — ABNORMAL HIGH (ref 70–99)
Potassium: 4.2 mmol/L (ref 3.5–5.1)
Sodium: 141 mmol/L (ref 135–145)
Total Bilirubin: 0.3 mg/dL (ref 0.0–1.2)
Total Protein: 6.6 g/dL (ref 6.5–8.1)

## 2024-05-23 MED ORDER — SODIUM CHLORIDE 0.9 % IV SOLN: INTRAVENOUS | Status: DC

## 2024-05-23 MED ORDER — HEPARIN SOD (PORK) LOCK FLUSH 100 UNIT/ML IV SOLN
500.0000 [IU] | Freq: Once | INTRAVENOUS | Status: AC
  Administered 2024-05-23: 500 [IU]

## 2024-05-23 MED ORDER — SODIUM CHLORIDE 0.9% FLUSH
10.0000 mL | INTRAVENOUS | Status: DC | PRN
  Administered 2024-05-23 (×2): 10 mL

## 2024-05-23 MED ORDER — HEPARIN SOD (PORK) LOCK FLUSH 100 UNIT/ML IV SOLN
500.0000 [IU] | Freq: Once | INTRAVENOUS | Status: DC | PRN
Start: 2024-05-23 — End: 2024-05-23

## 2024-05-23 MED ORDER — SODIUM CHLORIDE 0.9 % IV SOLN
800.0000 mg | Freq: Once | INTRAVENOUS | Status: AC
  Administered 2024-05-23: 800 mg via INTRAVENOUS
  Filled 2024-05-23: qty 21.04

## 2024-05-23 MED ORDER — PROCHLORPERAZINE MALEATE 10 MG PO TABS
10.0000 mg | ORAL_TABLET | Freq: Once | ORAL | Status: AC
Start: 2024-05-23 — End: 2024-05-23
  Administered 2024-05-23: 10 mg via ORAL
  Filled 2024-05-23: qty 1

## 2024-05-23 NOTE — Patient Instructions (Signed)

## 2024-05-23 NOTE — Progress Notes (Signed)
 Patient seen by Lonna Cobb NP today  Vitals are within treatment parameters:Yes   Labs are within treatment parameters: Yes   Treatment plan has been signed: Yes   Per physician team, Patient is ready for treatment and there are NO modifications to the treatment plan.

## 2024-05-23 NOTE — Patient Instructions (Signed)
 [facility] - A DEPT OF [facility]  Discharge Instructions: Thank you for choosing [facility] to provide your oncology and hematology care.   If you have a lab appointment with the Cancer Center, please go directly to the Cancer Center and check in at the registration area.   Wear comfortable clothing and clothing appropriate for easy access to any Portacath or PICC line.   We strive to give you quality time with your provider. You may need to reschedule your appointment if you arrive late (15 or more minutes).  Arriving late affects you and other patients whose appointments are after yours.  Also, if you miss three or more appointments without notifying the office, you may be dismissed from the clinic at the provider's discretion.      For prescription refill requests, have your pharmacy contact our office and allow 72 hours for refills to be completed.    Today you received the following chemotherapy and/or immunotherapy agents: Gemcitabine  (Gemzar )      To help prevent nausea and vomiting after your treatment, we encourage you to take your nausea medication as directed.  BELOW ARE SYMPTOMS THAT SHOULD BE REPORTED IMMEDIATELY: *FEVER GREATER THAN 100.4 F (38 C) OR HIGHER *CHILLS OR SWEATING *NAUSEA AND VOMITING THAT IS NOT CONTROLLED WITH YOUR NAUSEA MEDICATION *UNUSUAL SHORTNESS OF BREATH *UNUSUAL BRUISING OR BLEEDING *URINARY PROBLEMS (pain or burning when urinating, or frequent urination) *BOWEL PROBLEMS (unusual diarrhea, constipation, pain near the anus) TENDERNESS IN MOUTH AND THROAT WITH OR WITHOUT PRESENCE OF ULCERS (sore throat, sores in mouth, or a toothache) UNUSUAL RASH, SWELLING OR PAIN  UNUSUAL VAGINAL DISCHARGE OR ITCHING   Items with * indicate a potential emergency and should be followed up as soon as possible or go to the Emergency Department if any problems should occur.  Please show the CHEMOTHERAPY ALERT CARD or  IMMUNOTHERAPY ALERT CARD at check-in to the Emergency Department and triage nurse.  Should you have questions after your visit or need to cancel or reschedule your appointment, please contact [facility]  Dept: [phone]  and follow the prompts.  Office hours are 8:00 a.m. to 4:30 p.m. Monday - Friday. Please note that voicemails left after 4:00 p.m. may not be returned until the following business day.  We are closed weekends and major holidays. You have access to a nurse at all times for urgent questions. Please call the main number to the clinic Dept: [phone] and follow the prompts.   For any non-urgent questions, you may also contact your provider using MyChart. We now offer e-Visits for anyone 45 and older to request care online for non-urgent symptoms. For details visit [email or web address].   Also download the MyChart app! Go to the app store, search MyChart, open the app, select [place], and log in with your MyChart username and password.
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
## 2024-05-24 ENCOUNTER — Other Ambulatory Visit: Payer: Self-pay

## 2024-06-01 ENCOUNTER — Other Ambulatory Visit: Payer: Self-pay | Admitting: Oncology

## 2024-06-03 ENCOUNTER — Other Ambulatory Visit: Payer: Self-pay

## 2024-06-04 ENCOUNTER — Other Ambulatory Visit: Payer: Self-pay

## 2024-06-05 ENCOUNTER — Inpatient Hospital Stay

## 2024-06-05 ENCOUNTER — Inpatient Hospital Stay (HOSPITAL_BASED_OUTPATIENT_CLINIC_OR_DEPARTMENT_OTHER): Admitting: Oncology

## 2024-06-05 VITALS — BP 111/66 | HR 79 | Temp 97.8°F | Resp 18 | Ht 65.0 in | Wt 200.0 lb

## 2024-06-05 DIAGNOSIS — Z5111 Encounter for antineoplastic chemotherapy: Secondary | ICD-10-CM | POA: Diagnosis not present

## 2024-06-05 DIAGNOSIS — C3490 Malignant neoplasm of unspecified part of unspecified bronchus or lung: Secondary | ICD-10-CM

## 2024-06-05 DIAGNOSIS — C342 Malignant neoplasm of middle lobe, bronchus or lung: Secondary | ICD-10-CM

## 2024-06-05 LAB — CBC WITH DIFFERENTIAL (CANCER CENTER ONLY)
Abs Immature Granulocytes: 0.03 K/uL (ref 0.00–0.07)
Basophils Absolute: 0 K/uL (ref 0.0–0.1)
Basophils Relative: 0 %
Eosinophils Absolute: 0 K/uL (ref 0.0–0.5)
Eosinophils Relative: 1 %
HCT: 35 % — ABNORMAL LOW (ref 36.0–46.0)
Hemoglobin: 10.7 g/dL — ABNORMAL LOW (ref 12.0–15.0)
Immature Granulocytes: 1 %
Lymphocytes Relative: 29 %
Lymphs Abs: 1.2 K/uL (ref 0.7–4.0)
MCH: 31.2 pg (ref 26.0–34.0)
MCHC: 30.6 g/dL (ref 30.0–36.0)
MCV: 102 fL — ABNORMAL HIGH (ref 80.0–100.0)
Monocytes Absolute: 0.6 K/uL (ref 0.1–1.0)
Monocytes Relative: 14 %
Neutro Abs: 2.4 K/uL (ref 1.7–7.7)
Neutrophils Relative %: 55 %
Platelet Count: 175 K/uL (ref 150–400)
RBC: 3.43 MIL/uL — ABNORMAL LOW (ref 3.87–5.11)
RDW: 19.2 % — ABNORMAL HIGH (ref 11.5–15.5)
WBC Count: 4.2 K/uL (ref 4.0–10.5)
nRBC: 0 % (ref 0.0–0.2)

## 2024-06-05 LAB — CMP (CANCER CENTER ONLY)
ALT: 25 U/L (ref 0–44)
AST: 44 U/L — ABNORMAL HIGH (ref 15–41)
Albumin: 3.2 g/dL — ABNORMAL LOW (ref 3.5–5.0)
Alkaline Phosphatase: 83 U/L (ref 38–126)
Anion gap: 9 (ref 5–15)
BUN: 14 mg/dL (ref 8–23)
CO2: 28 mmol/L (ref 22–32)
Calcium: 8.7 mg/dL — ABNORMAL LOW (ref 8.9–10.3)
Chloride: 105 mmol/L (ref 98–111)
Creatinine: 1.27 mg/dL — ABNORMAL HIGH (ref 0.44–1.00)
GFR, Estimated: 43 mL/min — ABNORMAL LOW (ref >60)
Glucose, Bld: 101 mg/dL — ABNORMAL HIGH (ref 70–99)
Potassium: 4.4 mmol/L (ref 3.5–5.1)
Sodium: 142 mmol/L (ref 135–145)
Total Bilirubin: 0.2 mg/dL (ref 0.0–1.2)
Total Protein: 6.2 g/dL — ABNORMAL LOW (ref 6.5–8.1)

## 2024-06-05 MED ORDER — SODIUM CHLORIDE 0.9 % IV SOLN
800.0000 mg | Freq: Once | INTRAVENOUS | Status: AC
  Administered 2024-06-05: 800 mg via INTRAVENOUS
  Filled 2024-06-05: qty 21.04

## 2024-06-05 MED ORDER — OXYCODONE HCL 5 MG PO TABS
5.0000 mg | ORAL_TABLET | ORAL | 0 refills | Status: DC | PRN
Start: 2024-06-05 — End: 2024-06-30

## 2024-06-05 MED ORDER — ONDANSETRON HCL 4 MG PO TABS: 4.0000 mg | ORAL_TABLET | Freq: Four times a day (QID) | ORAL | 1 refills | Status: DC | PRN

## 2024-06-05 MED ORDER — PROCHLORPERAZINE MALEATE 10 MG PO TABS
10.0000 mg | ORAL_TABLET | Freq: Once | ORAL | Status: AC
  Administered 2024-06-05: 10 mg via ORAL
  Filled 2024-06-05: qty 1

## 2024-06-05 MED ORDER — SODIUM CHLORIDE 0.9 % IV SOLN
INTRAVENOUS | Status: DC
Start: 2024-06-05 — End: 2024-06-05

## 2024-06-05 MED ORDER — SODIUM CHLORIDE 0.9% FLUSH
10.0000 mL | INTRAVENOUS | Status: DC | PRN
Start: 2024-06-05 — End: 2024-06-05

## 2024-06-05 NOTE — Patient Instructions (Addendum)
 [facility] - A DEPT OF [facility]   Discharge Instructions: Thank you for choosing [facility] to provide your oncology and hematology care.   If you have a lab appointment with the Cancer Center, please go directly to the Cancer Center and check in at the registration area.   Wear comfortable clothing and clothing appropriate for easy access to any Portacath or PICC line.   We strive to give you quality time with your provider. You may need to reschedule your appointment if you arrive late (15 or more minutes).  Arriving late affects you and other patients whose appointments are after yours.  Also, if you miss three or more appointments without notifying the office, you may be dismissed from the clinic at the provider's discretion.      For prescription refill requests, have your pharmacy contact our office and allow 72 hours for refills to be completed.    Today you received the following chemotherapy and/or immunotherapy agents: Gemcitabine  (Gemzar )      To help prevent nausea and vomiting after your treatment, we encourage you to take your nausea medication as directed.  BELOW ARE SYMPTOMS THAT SHOULD BE REPORTED IMMEDIATELY: *FEVER GREATER THAN 100.4 F (38 C) OR HIGHER *CHILLS OR SWEATING *NAUSEA AND VOMITING THAT IS NOT CONTROLLED WITH YOUR NAUSEA MEDICATION *UNUSUAL SHORTNESS OF BREATH *UNUSUAL BRUISING OR BLEEDING *URINARY PROBLEMS (pain or burning when urinating, or frequent urination) *BOWEL PROBLEMS (unusual diarrhea, constipation, pain near the anus) TENDERNESS IN MOUTH AND THROAT WITH OR WITHOUT PRESENCE OF ULCERS (sore throat, sores in mouth, or a toothache) UNUSUAL RASH, SWELLING OR PAIN  UNUSUAL VAGINAL DISCHARGE OR ITCHING   Items with * indicate a potential emergency and should be followed up as soon as possible or go to the Emergency Department if any problems should occur.  Please show the CHEMOTHERAPY ALERT CARD or  IMMUNOTHERAPY ALERT CARD at check-in to the Emergency Department and triage nurse.  Should you have questions after your visit or need to cancel or reschedule your appointment, please contact [facility] - A DEPT OF [facility]  Dept: [phone]  and follow the prompts.  Office hours are 8:00 a.m. to 4:30 p.m. Monday - Friday. Please note that voicemails left after 4:00 p.m. may not be returned until the following business day.  We are closed weekends and major holidays. You have access to a nurse at all times for urgent questions. Please call the main number to the clinic Dept: [phone] and follow the prompts.   For any non-urgent questions, you may also contact your provider using MyChart. We now offer e-Visits for anyone 48 and older to request care online for non-urgent symptoms. For details visit [email or web address].   Also download the MyChart app! Go to the app store, search MyChart, open the app, select [place], and log in with your MyChart username and password.  Gemcitabine  Injection What is this medication? GEMCITABINE  (jem SYE ta been) treats some types of cancer. It works by slowing down the growth of cancer cells. This medicine may be used for other purposes; ask your health care provider or pharmacist if you have questions. COMMON BRAND NAME(S): Gemzar , Infugem  What should I tell my care team before I take this medication? They need to know if you have any of these conditions: Blood disorders Infection Kidney disease Liver disease Lung or breathing disease, such as asthma or COPD Recent or ongoing radiation therapy  An unusual or allergic reaction to gemcitabine , other medications, foods, dyes, or preservatives If you or your partner are pregnant or trying to get pregnant Breast-feeding How should I use this medication? This medication is injected into a vein. It is given by your care team in a hospital or clinic setting. Talk  to your care team about the use of this medication in children. Special care may be needed. Overdosage: If you think you have taken too much of this medicine contact a poison control center or emergency room at once. NOTE: This medicine is only for you. Do not share this medicine with others. What if I miss a dose? Keep appointments for follow-up doses. It is important not to miss your dose. Call your care team if you are unable to keep an appointment. What may interact with this medication? Interactions have not been studied. This list may not describe all possible interactions. Give your health care provider a list of all the medicines, herbs, non-prescription drugs, or dietary supplements you use. Also tell them if you smoke, drink alcohol , or use illegal drugs. Some items may interact with your medicine. What should I watch for while using this medication? Your condition will be monitored carefully while you are receiving this medication. This medication may make you feel generally unwell. This is not uncommon, as chemotherapy can affect healthy cells as well as cancer cells. Report any side effects. Continue your course of treatment even though you feel ill unless your care team tells you to stop. In some cases, you may be given additional medications to help with side effects. Follow all directions for their use. This medication may increase your risk of getting an infection. Call your care team for advice if you get a fever, chills, sore throat, or other symptoms of a cold or flu. Do not treat yourself. Try to avoid being around people who are sick. This medication may increase your risk to bruise or bleed. Call your care team if you notice any unusual bleeding. Be careful brushing or flossing your teeth or using a toothpick because you may get an infection or bleed more easily. If you have any dental work done, tell your dentist you are receiving this medication. Avoid taking medications that  contain aspirin, acetaminophen , ibuprofen, naproxen, or ketoprofen unless instructed by your care team. These medications may hide a fever. Talk to your care team if you or your partner wish to become pregnant or think you might be pregnant. This medication can cause serious birth defects if taken during pregnancy and for 6 months after the last dose. A negative pregnancy test is required before starting this medication. A reliable form of contraception is recommended while taking this medication and for 6 months after the last dose. Talk to your care team about effective forms of contraception. Do not father a child while taking this medication and for 3 months after the last dose. Use a condom while having sex during this time period. Do not breastfeed while taking this medication and for at least 1 week after the last dose. This medication may cause infertility. Talk to your care team if you are concerned about your fertility. What side effects may I notice from receiving this medication? Side effects that you should report to your care team as soon as possible: Allergic reactions--skin rash, itching, hives, swelling of the face, lips, tongue, or throat Capillary leak syndrome--stomach or muscle pain, unusual weakness or fatigue, feeling faint or lightheaded, decrease in the amount  of urine, swelling of the ankles, hands, or feet, trouble breathing Infection--fever, chills, cough, sore throat, wounds that don't heal, pain or trouble when passing urine, general feeling of discomfort or being unwell Liver injury--right upper belly pain, loss of appetite, nausea, light-colored stool, dark yellow or brown urine, yellowing skin or eyes, unusual weakness or fatigue Low red blood cell level--unusual weakness or fatigue, dizziness, headache, trouble breathing Lung injury--shortness of breath or trouble breathing, cough, spitting up blood, chest pain, fever Stomach pain, bloody diarrhea, pale skin, unusual  weakness or fatigue, decrease in the amount of urine, which may be signs of hemolytic uremic syndrome Sudden and severe headache, confusion, change in vision, seizures, which may be signs of posterior reversible encephalopathy syndrome (PRES) Unusual bruising or bleeding Side effects that usually do not require medical attention (report to your care team if they continue or are bothersome): Diarrhea Drowsiness Hair loss Nausea Pain, redness, or swelling with sores inside the mouth or throat Vomiting This list may not describe all possible side effects. Call your doctor for medical advice about side effects. You may report side effects to FDA at 1-800-FDA-1088. Where should I keep my medication? This medication is given in a hospital or clinic. It will not be stored at home. NOTE: This sheet is a summary. It may not cover all possible information. If you have questions about this medicine, talk to your doctor, pharmacist, or health care provider.  2024 Elsevier/Gold Standard (2022-02-14 00:00:00)

## 2024-06-05 NOTE — Progress Notes (Signed)
 Patient seen by Dr. Arley Hof today  Vitals are within treatment parameters:Yes   Labs are within treatment parameters: Yes   Treatment plan has been signed: Yes   Per physician team, Patient is ready for treatment. Please note the following modifications:Dose reduction due to weight loss.
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
## 2024-06-12 ENCOUNTER — Telehealth: Payer: Self-pay | Admitting: Oncology

## 2024-06-12 ENCOUNTER — Other Ambulatory Visit (HOSPITAL_BASED_OUTPATIENT_CLINIC_OR_DEPARTMENT_OTHER): Payer: Self-pay

## 2024-06-12 ENCOUNTER — Telehealth: Payer: Self-pay | Admitting: *Deleted

## 2024-06-12 MED ORDER — LOTEPREDNOL ETABONATE 0.5 % OP SUSP
OPHTHALMIC | 0 refills | Status: DC
  Filled 2024-06-12: qty 5, 43d supply, fill #0

## 2024-06-12 NOTE — Telephone Encounter (Signed)
 Crystal [name] called to report she does not have transportation for her 8/28 visits. Asking to reschedule to 8/29 if possible. Scheduling message sent.

## 2024-06-13 ENCOUNTER — Other Ambulatory Visit: Payer: Self-pay

## 2024-06-16 ENCOUNTER — Other Ambulatory Visit: Payer: Self-pay

## 2024-06-16 DIAGNOSIS — C8599 Non-Hodgkin lymphoma, unspecified, extranodal and solid organ sites: Secondary | ICD-10-CM

## 2024-06-19 ENCOUNTER — Ambulatory Visit: Admitting: Oncology

## 2024-06-19 ENCOUNTER — Other Ambulatory Visit

## 2024-06-19 ENCOUNTER — Ambulatory Visit

## 2024-06-19 ENCOUNTER — Encounter: Payer: Self-pay | Admitting: Nurse Practitioner

## 2024-06-19 ENCOUNTER — Inpatient Hospital Stay (HOSPITAL_BASED_OUTPATIENT_CLINIC_OR_DEPARTMENT_OTHER): Admitting: Nurse Practitioner

## 2024-06-19 ENCOUNTER — Telehealth: Payer: Self-pay

## 2024-06-19 ENCOUNTER — Inpatient Hospital Stay

## 2024-06-19 VITALS — BP 123/89 | HR 81 | Temp 97.8°F | Resp 18 | Ht 65.0 in | Wt 199.9 lb

## 2024-06-19 DIAGNOSIS — C8599 Non-Hodgkin lymphoma, unspecified, extranodal and solid organ sites: Secondary | ICD-10-CM

## 2024-06-19 DIAGNOSIS — C3490 Malignant neoplasm of unspecified part of unspecified bronchus or lung: Secondary | ICD-10-CM

## 2024-06-19 DIAGNOSIS — C342 Malignant neoplasm of middle lobe, bronchus or lung: Secondary | ICD-10-CM

## 2024-06-19 DIAGNOSIS — Z5111 Encounter for antineoplastic chemotherapy: Secondary | ICD-10-CM | POA: Diagnosis not present

## 2024-06-19 LAB — CBC WITH DIFFERENTIAL (CANCER CENTER ONLY)
Abs Immature Granulocytes: 0.01 K/uL (ref 0.00–0.07)
Basophils Absolute: 0 K/uL (ref 0.0–0.1)
Basophils Relative: 0 %
Eosinophils Absolute: 0 K/uL (ref 0.0–0.5)
Eosinophils Relative: 0 %
HCT: 33.3 % — ABNORMAL LOW (ref 36.0–46.0)
Hemoglobin: 10.6 g/dL — ABNORMAL LOW (ref 12.0–15.0)
Immature Granulocytes: 0 %
Lymphocytes Relative: 26 %
Lymphs Abs: 1.3 K/uL (ref 0.7–4.0)
MCH: 33.2 pg (ref 26.0–34.0)
MCHC: 31.8 g/dL (ref 30.0–36.0)
MCV: 104.4 fL — ABNORMAL HIGH (ref 80.0–100.0)
Monocytes Absolute: 0.6 K/uL (ref 0.1–1.0)
Monocytes Relative: 12 %
Neutro Abs: 3.1 K/uL (ref 1.7–7.7)
Neutrophils Relative %: 62 %
Platelet Count: 214 K/uL (ref 150–400)
RBC: 3.19 MIL/uL — ABNORMAL LOW (ref 3.87–5.11)
RDW: 21.5 % — ABNORMAL HIGH (ref 11.5–15.5)
WBC Count: 5 K/uL (ref 4.0–10.5)
nRBC: 0 % (ref 0.0–0.2)

## 2024-06-19 LAB — CMP (CANCER CENTER ONLY)
ALT: 34 U/L (ref 0–44)
AST: 45 U/L — ABNORMAL HIGH (ref 15–41)
Albumin: 3.4 g/dL — ABNORMAL LOW (ref 3.5–5.0)
Alkaline Phosphatase: 91 U/L (ref 38–126)
Anion gap: 9 (ref 5–15)
BUN: 11 mg/dL (ref 8–23)
CO2: 28 mmol/L (ref 22–32)
Calcium: 9.1 mg/dL (ref 8.9–10.3)
Chloride: 105 mmol/L (ref 98–111)
Creatinine: 1.13 mg/dL — ABNORMAL HIGH (ref 0.44–1.00)
GFR, Estimated: 49 mL/min — ABNORMAL LOW (ref >60)
Glucose, Bld: 106 mg/dL — ABNORMAL HIGH (ref 70–99)
Potassium: 4.2 mmol/L (ref 3.5–5.1)
Sodium: 142 mmol/L (ref 135–145)
Total Bilirubin: 0.3 mg/dL (ref 0.0–1.2)
Total Protein: 6.3 g/dL — ABNORMAL LOW (ref 6.5–8.1)

## 2024-06-19 MED ORDER — SODIUM CHLORIDE 0.9 % IV SOLN
800.0000 mg | Freq: Once | INTRAVENOUS | Status: AC
  Administered 2024-06-19: 800 mg via INTRAVENOUS
  Filled 2024-06-19: qty 21.04

## 2024-06-19 MED ORDER — PROCHLORPERAZINE MALEATE 10 MG PO TABS
10.0000 mg | ORAL_TABLET | Freq: Once | ORAL | Status: AC
  Administered 2024-06-19: 10 mg via ORAL
  Filled 2024-06-19: qty 1

## 2024-06-19 MED ORDER — SODIUM CHLORIDE 0.9 % IV SOLN: INTRAVENOUS | Status: DC

## 2024-06-19 NOTE — Patient Instructions (Signed)

## 2024-06-19 NOTE — Progress Notes (Signed)
 Patient seen by Olam Ned NP today  Vitals are within treatment parameters:Yes   Labs are within treatment parameters: Yes   Treatment plan has been signed: Yes   Per physician team, Patient is ready for treatment and there are NO modifications to the treatment plan.

## 2024-06-19 NOTE — Telephone Encounter (Signed)
 Spoke with [employer] pharmacist from [facility] to verify Oxycodone  prescription 1 tablet (5 mg total) by mouth every 4 hours as needed for severe pain, it was sent in on 06/05/24, provider is wanting clarification if prescription was filled or not, if so was it sent to patients facility? Pharmacist stated that the prescription had been refilled and sent over to [facility] living in [place].    04:21: Contacted [facility] in [place], unable to speak with a nurse after multiple attempts for verification, left a voicemail to call the [facility] at [phone]. Provider will be notified.
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
## 2024-06-19 NOTE — Patient Instructions (Signed)
 [facility] - A DEPT OF [facility]   Discharge Instructions: Thank you for choosing [facility] to provide your oncology and hematology care.   If you have a lab appointment with the Cancer Center, please go directly to the Cancer Center and check in at the registration area.   Wear comfortable clothing and clothing appropriate for easy access to any Portacath or PICC line.   We strive to give you quality time with your provider. You may need to reschedule your appointment if you arrive late (15 or more minutes).  Arriving late affects you and other patients whose appointments are after yours.  Also, if you miss three or more appointments without notifying the office, you may be dismissed from the clinic at the provider's discretion.      For prescription refill requests, have your pharmacy contact our office and allow 72 hours for refills to be completed.    Today you received the following chemotherapy and/or immunotherapy agents: Gemcitabine  (Gemzar )      To help prevent nausea and vomiting after your treatment, we encourage you to take your nausea medication as directed.  BELOW ARE SYMPTOMS THAT SHOULD BE REPORTED IMMEDIATELY: *FEVER GREATER THAN 100.4 F (38 C) OR HIGHER *CHILLS OR SWEATING *NAUSEA AND VOMITING THAT IS NOT CONTROLLED WITH YOUR NAUSEA MEDICATION *UNUSUAL SHORTNESS OF BREATH *UNUSUAL BRUISING OR BLEEDING *URINARY PROBLEMS (pain or burning when urinating, or frequent urination) *BOWEL PROBLEMS (unusual diarrhea, constipation, pain near the anus) TENDERNESS IN MOUTH AND THROAT WITH OR WITHOUT PRESENCE OF ULCERS (sore throat, sores in mouth, or a toothache) UNUSUAL RASH, SWELLING OR PAIN  UNUSUAL VAGINAL DISCHARGE OR ITCHING   Items with * indicate a potential emergency and should be followed up as soon as possible or go to the Emergency Department if any problems should occur.  Please show the CHEMOTHERAPY ALERT CARD or  IMMUNOTHERAPY ALERT CARD at check-in to the Emergency Department and triage nurse.  Should you have questions after your visit or need to cancel or reschedule your appointment, please contact [facility] - A DEPT OF [facility]  Dept: [phone]  and follow the prompts.  Office hours are 8:00 a.m. to 4:30 p.m. Monday - Friday. Please note that voicemails left after 4:00 p.m. may not be returned until the following business day.  We are closed weekends and major holidays. You have access to a nurse at all times for urgent questions. Please call the main number to the clinic Dept: [phone] and follow the prompts.   For any non-urgent questions, you may also contact your provider using MyChart. We now offer e-Visits for anyone 48 and older to request care online for non-urgent symptoms. For details visit [email or web address].   Also download the MyChart app! Go to the app store, search MyChart, open the app, select [place], and log in with your MyChart username and password.  Gemcitabine  Injection What is this medication? GEMCITABINE  (jem SYE ta been) treats some types of cancer. It works by slowing down the growth of cancer cells. This medicine may be used for other purposes; ask your health care provider or pharmacist if you have questions. COMMON BRAND NAME(S): Gemzar , Infugem  What should I tell my care team before I take this medication? They need to know if you have any of these conditions: Blood disorders Infection Kidney disease Liver disease Lung or breathing disease, such as asthma or COPD Recent or ongoing radiation therapy  An unusual or allergic reaction to gemcitabine , other medications, foods, dyes, or preservatives If you or your partner are pregnant or trying to get pregnant Breast-feeding How should I use this medication? This medication is injected into a vein. It is given by your care team in a hospital or clinic setting. Talk  to your care team about the use of this medication in children. Special care may be needed. Overdosage: If you think you have taken too much of this medicine contact a poison control center or emergency room at once. NOTE: This medicine is only for you. Do not share this medicine with others. What if I miss a dose? Keep appointments for follow-up doses. It is important not to miss your dose. Call your care team if you are unable to keep an appointment. What may interact with this medication? Interactions have not been studied. This list may not describe all possible interactions. Give your health care provider a list of all the medicines, herbs, non-prescription drugs, or dietary supplements you use. Also tell them if you smoke, drink alcohol , or use illegal drugs. Some items may interact with your medicine. What should I watch for while using this medication? Your condition will be monitored carefully while you are receiving this medication. This medication may make you feel generally unwell. This is not uncommon, as chemotherapy can affect healthy cells as well as cancer cells. Report any side effects. Continue your course of treatment even though you feel ill unless your care team tells you to stop. In some cases, you may be given additional medications to help with side effects. Follow all directions for their use. This medication may increase your risk of getting an infection. Call your care team for advice if you get a fever, chills, sore throat, or other symptoms of a cold or flu. Do not treat yourself. Try to avoid being around people who are sick. This medication may increase your risk to bruise or bleed. Call your care team if you notice any unusual bleeding. Be careful brushing or flossing your teeth or using a toothpick because you may get an infection or bleed more easily. If you have any dental work done, tell your dentist you are receiving this medication. Avoid taking medications that  contain aspirin, acetaminophen , ibuprofen, naproxen, or ketoprofen unless instructed by your care team. These medications may hide a fever. Talk to your care team if you or your partner wish to become pregnant or think you might be pregnant. This medication can cause serious birth defects if taken during pregnancy and for 6 months after the last dose. A negative pregnancy test is required before starting this medication. A reliable form of contraception is recommended while taking this medication and for 6 months after the last dose. Talk to your care team about effective forms of contraception. Do not father a child while taking this medication and for 3 months after the last dose. Use a condom while having sex during this time period. Do not breastfeed while taking this medication and for at least 1 week after the last dose. This medication may cause infertility. Talk to your care team if you are concerned about your fertility. What side effects may I notice from receiving this medication? Side effects that you should report to your care team as soon as possible: Allergic reactions--skin rash, itching, hives, swelling of the face, lips, tongue, or throat Capillary leak syndrome--stomach or muscle pain, unusual weakness or fatigue, feeling faint or lightheaded, decrease in the amount  of urine, swelling of the ankles, hands, or feet, trouble breathing Infection--fever, chills, cough, sore throat, wounds that don't heal, pain or trouble when passing urine, general feeling of discomfort or being unwell Liver injury--right upper belly pain, loss of appetite, nausea, light-colored stool, dark yellow or brown urine, yellowing skin or eyes, unusual weakness or fatigue Low red blood cell level--unusual weakness or fatigue, dizziness, headache, trouble breathing Lung injury--shortness of breath or trouble breathing, cough, spitting up blood, chest pain, fever Stomach pain, bloody diarrhea, pale skin, unusual  weakness or fatigue, decrease in the amount of urine, which may be signs of hemolytic uremic syndrome Sudden and severe headache, confusion, change in vision, seizures, which may be signs of posterior reversible encephalopathy syndrome (PRES) Unusual bruising or bleeding Side effects that usually do not require medical attention (report to your care team if they continue or are bothersome): Diarrhea Drowsiness Hair loss Nausea Pain, redness, or swelling with sores inside the mouth or throat Vomiting This list may not describe all possible side effects. Call your doctor for medical advice about side effects. You may report side effects to FDA at 1-800-FDA-1088. Where should I keep my medication? This medication is given in a hospital or clinic. It will not be stored at home. NOTE: This sheet is a summary. It may not cover all possible information. If you have questions about this medicine, talk to your doctor, pharmacist, or health care provider.  2024 Elsevier/Gold Standard (2022-02-14 00:00:00)

## 2024-06-20 ENCOUNTER — Other Ambulatory Visit: Payer: Self-pay

## 2024-06-25 ENCOUNTER — Telehealth: Payer: Self-pay

## 2024-06-25 ENCOUNTER — Telehealth: Payer: Self-pay | Admitting: *Deleted

## 2024-06-25 ENCOUNTER — Other Ambulatory Visit (HOSPITAL_BASED_OUTPATIENT_CLINIC_OR_DEPARTMENT_OTHER): Payer: Self-pay

## 2024-06-25 MED ORDER — LOTEPREDNOL ETABONATE 0.5 % OP SUSP
OPHTHALMIC | 0 refills | Status: DC
  Filled 2024-06-25: qty 5, 43d supply, fill #0
  Filled 2024-06-25 – 2024-06-30 (×2): qty 5, 14d supply, fill #0

## 2024-06-25 NOTE — Telephone Encounter (Signed)
 Crystal [name] called to report she has not heard from central scheduling about her Ct chest due on 9/08 at [facility]. Called central scheduling was informed all spots are taken. Called and left VM for front desk at [facility] radiology asking if they can find a spot to get her in before 9/11 OV.

## 2024-06-29 ENCOUNTER — Other Ambulatory Visit: Payer: Self-pay | Admitting: Oncology

## 2024-06-30 ENCOUNTER — Other Ambulatory Visit: Payer: Self-pay | Admitting: Nurse Practitioner

## 2024-06-30 ENCOUNTER — Ambulatory Visit (HOSPITAL_BASED_OUTPATIENT_CLINIC_OR_DEPARTMENT_OTHER)
Admission: RE | Admit: 2024-06-30 | Discharge: 2024-06-30 | Disposition: A | Discharge status: Home / Self Care | Source: Ambulatory Visit | Attending: Nurse Practitioner | Admitting: Nurse Practitioner

## 2024-06-30 ENCOUNTER — Other Ambulatory Visit (HOSPITAL_BASED_OUTPATIENT_CLINIC_OR_DEPARTMENT_OTHER): Payer: Self-pay

## 2024-06-30 ENCOUNTER — Telehealth: Payer: Self-pay | Admitting: *Deleted

## 2024-06-30 DIAGNOSIS — C3490 Malignant neoplasm of unspecified part of unspecified bronchus or lung: Secondary | ICD-10-CM

## 2024-06-30 MED ORDER — OXYCODONE HCL 5 MG PO TABS: 5.0000 mg | ORAL_TABLET | ORAL | 0 refills | Status: DC | PRN

## 2024-06-30 NOTE — Telephone Encounter (Signed)
 LVM requesting refill on pain medication (has #6 left). Wants to p/u at [facility] while here for scan. Called her back and informed her it will be refilled by end of day and sent to [facility]. She understands and agrees.

## 2024-07-03 ENCOUNTER — Inpatient Hospital Stay (HOSPITAL_BASED_OUTPATIENT_CLINIC_OR_DEPARTMENT_OTHER): Admitting: Nurse Practitioner

## 2024-07-03 ENCOUNTER — Inpatient Hospital Stay

## 2024-07-03 ENCOUNTER — Ambulatory Visit

## 2024-07-03 ENCOUNTER — Ambulatory Visit: Admitting: Nurse Practitioner

## 2024-07-03 ENCOUNTER — Other Ambulatory Visit

## 2024-07-03 ENCOUNTER — Other Ambulatory Visit: Payer: Self-pay

## 2024-07-03 ENCOUNTER — Inpatient Hospital Stay: Attending: Oncology

## 2024-07-03 ENCOUNTER — Encounter: Payer: Self-pay | Admitting: Nurse Practitioner

## 2024-07-03 VITALS — BP 99/75 | HR 77 | Temp 97.9°F | Resp 18 | Wt 205.5 lb

## 2024-07-03 VITALS — BP 122/62 | HR 73 | Resp 18

## 2024-07-03 DIAGNOSIS — R519 Headache, unspecified: Secondary | ICD-10-CM | POA: Diagnosis not present

## 2024-07-03 DIAGNOSIS — C342 Malignant neoplasm of middle lobe, bronchus or lung: Secondary | ICD-10-CM | POA: Diagnosis not present

## 2024-07-03 DIAGNOSIS — C3490 Malignant neoplasm of unspecified part of unspecified bronchus or lung: Secondary | ICD-10-CM

## 2024-07-03 DIAGNOSIS — C7951 Secondary malignant neoplasm of bone: Secondary | ICD-10-CM | POA: Diagnosis present

## 2024-07-03 DIAGNOSIS — Z5111 Encounter for antineoplastic chemotherapy: Secondary | ICD-10-CM | POA: Insufficient documentation

## 2024-07-03 LAB — CMP (CANCER CENTER ONLY)
ALT: 31 U/L (ref 0–44)
AST: 48 U/L — ABNORMAL HIGH (ref 15–41)
Albumin: 3.4 g/dL — ABNORMAL LOW (ref 3.5–5.0)
Alkaline Phosphatase: 95 U/L (ref 38–126)
Anion gap: 10 (ref 5–15)
BUN: 17 mg/dL (ref 8–23)
CO2: 26 mmol/L (ref 22–32)
Calcium: 8.7 mg/dL — ABNORMAL LOW (ref 8.9–10.3)
Chloride: 105 mmol/L (ref 98–111)
Creatinine: 1.23 mg/dL — ABNORMAL HIGH (ref 0.44–1.00)
GFR, Estimated: 44 mL/min — ABNORMAL LOW (ref >60)
Glucose, Bld: 108 mg/dL — ABNORMAL HIGH (ref 70–99)
Potassium: 4.5 mmol/L (ref 3.5–5.1)
Sodium: 141 mmol/L (ref 135–145)
Total Bilirubin: 0.3 mg/dL (ref 0.0–1.2)
Total Protein: 6.4 g/dL — ABNORMAL LOW (ref 6.5–8.1)

## 2024-07-03 LAB — CBC WITH DIFFERENTIAL (CANCER CENTER ONLY)
Abs Immature Granulocytes: 0.02 K/uL (ref 0.00–0.07)
Basophils Absolute: 0 K/uL (ref 0.0–0.1)
Basophils Relative: 1 %
Eosinophils Absolute: 0.1 K/uL (ref 0.0–0.5)
Eosinophils Relative: 1 %
HCT: 33.5 % — ABNORMAL LOW (ref 36.0–46.0)
Hemoglobin: 10.5 g/dL — ABNORMAL LOW (ref 12.0–15.0)
Immature Granulocytes: 1 %
Lymphocytes Relative: 30 %
Lymphs Abs: 1.3 K/uL (ref 0.7–4.0)
MCH: 34.2 pg — ABNORMAL HIGH (ref 26.0–34.0)
MCHC: 31.3 g/dL (ref 30.0–36.0)
MCV: 109.1 fL — ABNORMAL HIGH (ref 80.0–100.0)
Monocytes Absolute: 0.6 K/uL (ref 0.1–1.0)
Monocytes Relative: 14 %
Neutro Abs: 2.3 K/uL (ref 1.7–7.7)
Neutrophils Relative %: 53 %
Platelet Count: 183 K/uL (ref 150–400)
RBC: 3.07 MIL/uL — ABNORMAL LOW (ref 3.87–5.11)
RDW: 22.9 % — ABNORMAL HIGH (ref 11.5–15.5)
WBC Count: 4.3 K/uL (ref 4.0–10.5)
nRBC: 0 % (ref 0.0–0.2)

## 2024-07-03 MED ORDER — PROCHLORPERAZINE MALEATE 10 MG PO TABS
10.0000 mg | ORAL_TABLET | Freq: Once | ORAL | Status: AC
  Administered 2024-07-03: 10 mg via ORAL
  Filled 2024-07-03: qty 1

## 2024-07-03 MED ORDER — SODIUM CHLORIDE 0.9 % IV SOLN: INTRAVENOUS | Status: DC

## 2024-07-03 MED ORDER — SODIUM CHLORIDE 0.9 % IV SOLN
800.0000 mg | Freq: Once | INTRAVENOUS | Status: AC
  Administered 2024-07-03: 800 mg via INTRAVENOUS
  Filled 2024-07-03: qty 21.04

## 2024-07-03 NOTE — Patient Instructions (Signed)
 [facility] - A DEPT OF [facility]   Discharge Instructions: Thank you for choosing [facility] to provide your oncology and hematology care.   If you have a lab appointment with the Cancer Center, please go directly to the Cancer Center and check in at the registration area.   Wear comfortable clothing and clothing appropriate for easy access to any Portacath or PICC line.   We strive to give you quality time with your provider. You may need to reschedule your appointment if you arrive late (15 or more minutes).  Arriving late affects you and other patients whose appointments are after yours.  Also, if you miss three or more appointments without notifying the office, you may be dismissed from the clinic at the provider's discretion.      For prescription refill requests, have your pharmacy contact our office and allow 72 hours for refills to be completed.    Today you received the following chemotherapy and/or immunotherapy agents: Gemcitabine  (Gemzar )      To help prevent nausea and vomiting after your treatment, we encourage you to take your nausea medication as directed.  BELOW ARE SYMPTOMS THAT SHOULD BE REPORTED IMMEDIATELY: *FEVER GREATER THAN 100.4 F (38 C) OR HIGHER *CHILLS OR SWEATING *NAUSEA AND VOMITING THAT IS NOT CONTROLLED WITH YOUR NAUSEA MEDICATION *UNUSUAL SHORTNESS OF BREATH *UNUSUAL BRUISING OR BLEEDING *URINARY PROBLEMS (pain or burning when urinating, or frequent urination) *BOWEL PROBLEMS (unusual diarrhea, constipation, pain near the anus) TENDERNESS IN MOUTH AND THROAT WITH OR WITHOUT PRESENCE OF ULCERS (sore throat, sores in mouth, or a toothache) UNUSUAL RASH, SWELLING OR PAIN  UNUSUAL VAGINAL DISCHARGE OR ITCHING   Items with * indicate a potential emergency and should be followed up as soon as possible or go to the Emergency Department if any problems should occur.  Please show the CHEMOTHERAPY ALERT CARD or  IMMUNOTHERAPY ALERT CARD at check-in to the Emergency Department and triage nurse.  Should you have questions after your visit or need to cancel or reschedule your appointment, please contact [facility] - A DEPT OF [facility]  Dept: [phone]  and follow the prompts.  Office hours are 8:00 a.m. to 4:30 p.m. Monday - Friday. Please note that voicemails left after 4:00 p.m. may not be returned until the following business day.  We are closed weekends and major holidays. You have access to a nurse at all times for urgent questions. Please call the main number to the clinic Dept: [phone] and follow the prompts.   For any non-urgent questions, you may also contact your provider using MyChart. We now offer e-Visits for anyone 48 and older to request care online for non-urgent symptoms. For details visit [email or web address].   Also download the MyChart app! Go to the app store, search MyChart, open the app, select [place], and log in with your MyChart username and password.  Gemcitabine  Injection What is this medication? GEMCITABINE  (jem SYE ta been) treats some types of cancer. It works by slowing down the growth of cancer cells. This medicine may be used for other purposes; ask your health care provider or pharmacist if you have questions. COMMON BRAND NAME(S): Gemzar , Infugem  What should I tell my care team before I take this medication? They need to know if you have any of these conditions: Blood disorders Infection Kidney disease Liver disease Lung or breathing disease, such as asthma or COPD Recent or ongoing radiation therapy  An unusual or allergic reaction to gemcitabine , other medications, foods, dyes, or preservatives If you or your partner are pregnant or trying to get pregnant Breast-feeding How should I use this medication? This medication is injected into a vein. It is given by your care team in a hospital or clinic setting. Talk  to your care team about the use of this medication in children. Special care may be needed. Overdosage: If you think you have taken too much of this medicine contact a poison control center or emergency room at once. NOTE: This medicine is only for you. Do not share this medicine with others. What if I miss a dose? Keep appointments for follow-up doses. It is important not to miss your dose. Call your care team if you are unable to keep an appointment. What may interact with this medication? Interactions have not been studied. This list may not describe all possible interactions. Give your health care provider a list of all the medicines, herbs, non-prescription drugs, or dietary supplements you use. Also tell them if you smoke, drink alcohol , or use illegal drugs. Some items may interact with your medicine. What should I watch for while using this medication? Your condition will be monitored carefully while you are receiving this medication. This medication may make you feel generally unwell. This is not uncommon, as chemotherapy can affect healthy cells as well as cancer cells. Report any side effects. Continue your course of treatment even though you feel ill unless your care team tells you to stop. In some cases, you may be given additional medications to help with side effects. Follow all directions for their use. This medication may increase your risk of getting an infection. Call your care team for advice if you get a fever, chills, sore throat, or other symptoms of a cold or flu. Do not treat yourself. Try to avoid being around people who are sick. This medication may increase your risk to bruise or bleed. Call your care team if you notice any unusual bleeding. Be careful brushing or flossing your teeth or using a toothpick because you may get an infection or bleed more easily. If you have any dental work done, tell your dentist you are receiving this medication. Avoid taking medications that  contain aspirin, acetaminophen , ibuprofen, naproxen, or ketoprofen unless instructed by your care team. These medications may hide a fever. Talk to your care team if you or your partner wish to become pregnant or think you might be pregnant. This medication can cause serious birth defects if taken during pregnancy and for 6 months after the last dose. A negative pregnancy test is required before starting this medication. A reliable form of contraception is recommended while taking this medication and for 6 months after the last dose. Talk to your care team about effective forms of contraception. Do not father a child while taking this medication and for 3 months after the last dose. Use a condom while having sex during this time period. Do not breastfeed while taking this medication and for at least 1 week after the last dose. This medication may cause infertility. Talk to your care team if you are concerned about your fertility. What side effects may I notice from receiving this medication? Side effects that you should report to your care team as soon as possible: Allergic reactions--skin rash, itching, hives, swelling of the face, lips, tongue, or throat Capillary leak syndrome--stomach or muscle pain, unusual weakness or fatigue, feeling faint or lightheaded, decrease in the amount  of urine, swelling of the ankles, hands, or feet, trouble breathing Infection--fever, chills, cough, sore throat, wounds that don't heal, pain or trouble when passing urine, general feeling of discomfort or being unwell Liver injury--right upper belly pain, loss of appetite, nausea, light-colored stool, dark yellow or brown urine, yellowing skin or eyes, unusual weakness or fatigue Low red blood cell level--unusual weakness or fatigue, dizziness, headache, trouble breathing Lung injury--shortness of breath or trouble breathing, cough, spitting up blood, chest pain, fever Stomach pain, bloody diarrhea, pale skin, unusual  weakness or fatigue, decrease in the amount of urine, which may be signs of hemolytic uremic syndrome Sudden and severe headache, confusion, change in vision, seizures, which may be signs of posterior reversible encephalopathy syndrome (PRES) Unusual bruising or bleeding Side effects that usually do not require medical attention (report to your care team if they continue or are bothersome): Diarrhea Drowsiness Hair loss Nausea Pain, redness, or swelling with sores inside the mouth or throat Vomiting This list may not describe all possible side effects. Call your doctor for medical advice about side effects. You may report side effects to FDA at 1-800-FDA-1088. Where should I keep my medication? This medication is given in a hospital or clinic. It will not be stored at home. NOTE: This sheet is a summary. It may not cover all possible information. If you have questions about this medicine, talk to your doctor, pharmacist, or health care provider.  2024 Elsevier/Gold Standard (2022-02-14 00:00:00)
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
## 2024-07-03 NOTE — Progress Notes (Unsigned)
 Patient seen by Olam Ned NP today  Vitals are within treatment parameters:Yes   Labs are within treatment parameters: Yes   Treatment plan has been signed: Yes   Per physician team, Patient is ready for treatment and there are NO modifications to the treatment plan.

## 2024-07-04 ENCOUNTER — Encounter: Payer: Self-pay | Admitting: Oncology

## 2024-07-10 ENCOUNTER — Other Ambulatory Visit: Payer: Self-pay

## 2024-07-11 ENCOUNTER — Other Ambulatory Visit: Payer: Self-pay | Admitting: *Deleted

## 2024-07-11 DIAGNOSIS — C3490 Malignant neoplasm of unspecified part of unspecified bronchus or lung: Secondary | ICD-10-CM

## 2024-07-13 ENCOUNTER — Other Ambulatory Visit: Payer: Self-pay | Admitting: Oncology

## 2024-07-18 ENCOUNTER — Inpatient Hospital Stay

## 2024-07-18 ENCOUNTER — Inpatient Hospital Stay (HOSPITAL_BASED_OUTPATIENT_CLINIC_OR_DEPARTMENT_OTHER): Admitting: Oncology

## 2024-07-18 ENCOUNTER — Encounter: Payer: Self-pay | Admitting: Oncology

## 2024-07-18 ENCOUNTER — Other Ambulatory Visit: Payer: Self-pay | Admitting: Nurse Practitioner

## 2024-07-18 ENCOUNTER — Ambulatory Visit: Payer: Self-pay | Admitting: Oncology

## 2024-07-18 ENCOUNTER — Other Ambulatory Visit: Payer: Self-pay | Admitting: Oncology

## 2024-07-18 ENCOUNTER — Ambulatory Visit (HOSPITAL_BASED_OUTPATIENT_CLINIC_OR_DEPARTMENT_OTHER)
Admission: RE | Admit: 2024-07-18 | Discharge: 2024-07-18 | Disposition: A | Discharge status: Home / Self Care | Source: Ambulatory Visit | Attending: Oncology | Admitting: Oncology

## 2024-07-18 VITALS — BP 112/62 | HR 76 | Temp 98.1°F | Resp 18 | Ht 65.0 in | Wt 196.0 lb

## 2024-07-18 DIAGNOSIS — C3490 Malignant neoplasm of unspecified part of unspecified bronchus or lung: Secondary | ICD-10-CM | POA: Insufficient documentation

## 2024-07-18 DIAGNOSIS — C342 Malignant neoplasm of middle lobe, bronchus or lung: Secondary | ICD-10-CM

## 2024-07-18 DIAGNOSIS — Z5111 Encounter for antineoplastic chemotherapy: Secondary | ICD-10-CM | POA: Diagnosis not present

## 2024-07-18 LAB — CMP (CANCER CENTER ONLY)
ALT: 18 U/L (ref 0–44)
AST: 32 U/L (ref 15–41)
Albumin: 3.4 g/dL — ABNORMAL LOW (ref 3.5–5.0)
Alkaline Phosphatase: 89 U/L (ref 38–126)
Anion gap: 10 (ref 5–15)
BUN: 16 mg/dL (ref 8–23)
CO2: 27 mmol/L (ref 22–32)
Calcium: 9 mg/dL (ref 8.9–10.3)
Chloride: 105 mmol/L (ref 98–111)
Creatinine: 1.13 mg/dL — ABNORMAL HIGH (ref 0.44–1.00)
GFR, Estimated: 49 mL/min — ABNORMAL LOW (ref >60)
Glucose, Bld: 128 mg/dL — ABNORMAL HIGH (ref 70–99)
Potassium: 4.4 mmol/L (ref 3.5–5.1)
Sodium: 141 mmol/L (ref 135–145)
Total Bilirubin: 0.4 mg/dL (ref 0.0–1.2)
Total Protein: 6.6 g/dL (ref 6.5–8.1)

## 2024-07-18 LAB — CBC WITH DIFFERENTIAL (CANCER CENTER ONLY)
Abs Immature Granulocytes: 0.01 K/uL (ref 0.00–0.07)
Basophils Absolute: 0 K/uL (ref 0.0–0.1)
Basophils Relative: 0 %
Eosinophils Absolute: 0 K/uL (ref 0.0–0.5)
Eosinophils Relative: 0 %
HCT: 34.9 % — ABNORMAL LOW (ref 36.0–46.0)
Hemoglobin: 10.9 g/dL — ABNORMAL LOW (ref 12.0–15.0)
Immature Granulocytes: 0 %
Lymphocytes Relative: 24 %
Lymphs Abs: 1.1 K/uL (ref 0.7–4.0)
MCH: 34.7 pg — ABNORMAL HIGH (ref 26.0–34.0)
MCHC: 31.2 g/dL (ref 30.0–36.0)
MCV: 111.1 fL — ABNORMAL HIGH (ref 80.0–100.0)
Monocytes Absolute: 0.6 K/uL (ref 0.1–1.0)
Monocytes Relative: 13 %
Neutro Abs: 2.9 K/uL (ref 1.7–7.7)
Neutrophils Relative %: 63 %
Platelet Count: 222 K/uL (ref 150–400)
RBC: 3.14 MIL/uL — ABNORMAL LOW (ref 3.87–5.11)
RDW: 22 % — ABNORMAL HIGH (ref 11.5–15.5)
WBC Count: 4.6 K/uL (ref 4.0–10.5)
nRBC: 0 % (ref 0.0–0.2)

## 2024-07-18 MED ORDER — OXYCODONE HCL 5 MG PO TABS: 5.0000 mg | ORAL_TABLET | ORAL | 0 refills | Status: DC | PRN

## 2024-07-18 MED ORDER — IOHEXOL 350 MG/ML SOLN
75.0000 mL | Freq: Once | INTRAVENOUS | Status: AC | PRN
  Administered 2024-07-18: 75 mL via INTRAVENOUS
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
## 2024-07-18 NOTE — Progress Notes (Signed)
 Seen in clinic today and they are ordering a STAT head CT. Need port changed to a [employer]. Deaccessed then reaccessed with [employer]. Pt tolerated well. See flowsheet.

## 2024-07-18 NOTE — Telephone Encounter (Signed)
 Notified Crystal [name] that CT head showed no cancer. Does show an old left brain stroke. MD suggests MRI brain and she agrees. Will check with managed care re: PA on Monday.

## 2024-07-18 NOTE — Patient Instructions (Signed)

## 2024-07-21 ENCOUNTER — Telehealth: Payer: Self-pay | Admitting: *Deleted

## 2024-07-21 NOTE — Telephone Encounter (Signed)
 Called Crystal [name] with her MRI appointment at [facility] on 10/03 w/11 am arrival time. No prep, but do not wear jewelry. She will come to [facility] for port access at 10:15

## 2024-07-22 ENCOUNTER — Emergency Department (HOSPITAL_BASED_OUTPATIENT_CLINIC_OR_DEPARTMENT_OTHER)

## 2024-07-22 ENCOUNTER — Other Ambulatory Visit: Payer: Self-pay

## 2024-07-22 ENCOUNTER — Emergency Department (HOSPITAL_BASED_OUTPATIENT_CLINIC_OR_DEPARTMENT_OTHER): Admitting: Radiology

## 2024-07-22 ENCOUNTER — Telehealth: Payer: Self-pay | Admitting: *Deleted

## 2024-07-22 ENCOUNTER — Emergency Department (HOSPITAL_BASED_OUTPATIENT_CLINIC_OR_DEPARTMENT_OTHER)
Admission: EM | Admit: 2024-07-22 | Discharge: 2024-07-22 | Disposition: A | Discharge status: Home / Self Care | Attending: Emergency Medicine | Admitting: Emergency Medicine

## 2024-07-22 DIAGNOSIS — S0990XA Unspecified injury of head, initial encounter: Secondary | ICD-10-CM | POA: Diagnosis present

## 2024-07-22 DIAGNOSIS — M79671 Pain in right foot: Secondary | ICD-10-CM | POA: Diagnosis present

## 2024-07-22 DIAGNOSIS — Z7901 Long term (current) use of anticoagulants: Secondary | ICD-10-CM | POA: Insufficient documentation

## 2024-07-22 DIAGNOSIS — Z79899 Other long term (current) drug therapy: Secondary | ICD-10-CM | POA: Insufficient documentation

## 2024-07-22 DIAGNOSIS — K589 Irritable bowel syndrome without diarrhea: Secondary | ICD-10-CM | POA: Insufficient documentation

## 2024-07-22 DIAGNOSIS — S9031XA Contusion of right foot, initial encounter: Secondary | ICD-10-CM | POA: Insufficient documentation

## 2024-07-22 DIAGNOSIS — Z85118 Personal history of other malignant neoplasm of bronchus and lung: Secondary | ICD-10-CM | POA: Insufficient documentation

## 2024-07-22 DIAGNOSIS — I10 Essential (primary) hypertension: Secondary | ICD-10-CM | POA: Diagnosis not present

## 2024-07-22 DIAGNOSIS — W19XXXA Unspecified fall, initial encounter: Secondary | ICD-10-CM

## 2024-07-22 DIAGNOSIS — W01198A Fall on same level from slipping, tripping and stumbling with subsequent striking against other object, initial encounter: Secondary | ICD-10-CM | POA: Insufficient documentation

## 2024-07-22 NOTE — Discharge Instructions (Signed)
 The x-ray of your foot did not show any fractures.  You were given copy of the report for your CT scan, please follow-up with your MRI at your scheduled appointment.

## 2024-07-22 NOTE — ED Provider Notes (Signed)
 [facility] EMERGENCY DEPARTMENT AT [facility] Provider Note   CSN: [id number] Arrival date & time: 07/22/24  1614     Patient presents with: Crystal [name]   Crystal [name] is a 78 y.o. female.   78 year old female with a past medical history of hypertension, IBS, ICH on Eliquis  presents to the ED with a chief complaint of fall.  Patient reports he was trying to get into her sister's vehicle who is accompanying her today.  She suddenly lost her footing and fell striking the right foot.  Having severe pain along the dorsum aspect which appears bruised.  She does report hitting the back of her head on her sister's shin.  She did not lose consciousness.  There was no dizziness, she was able to get up.  She does report pain along the dorsum aspect exacerbated with wearing her shoe.  Has not taken any medication for pain. No nausea, vomiting or LOC.   The history is provided by the patient.  Fall Pertinent negatives include no chest pain, no abdominal pain and no shortness of breath.       Prior to Admission medications   Medication Sig Start Date End Date Taking? Authorizing Provider  apixaban  (ELIQUIS ) 5 MG TABS tablet Take 1 tablet (5 mg total) by mouth 2 (two) times daily. 03/30/23   Love, Sharlet RAMAN, PA-C  ferrous sulfate  325 (65 FE) MG tablet Take 1 tablet (325 mg total) by mouth daily with lunch. 02/08/24   Cloretta Arley NOVAK, MD  folic acid  (FOLVITE ) 1 MG tablet Take 1 tablet (1 mg total) by mouth daily. 03/30/23   Love, Sharlet RAMAN, PA-C  lidocaine -prilocaine  (EMLA ) cream Apply 1 Application topically as directed. Apply to [facility] site 2 hours prior to stick and cover with plastic wrap to numb site 04/16/24   Cloretta Arley NOVAK, MD  LORazepam  (ATIVAN ) 0.5 MG tablet Take 1 tablet (0.5 mg total) by mouth 2 (two) times daily as needed for anxiety. 03/13/24   Cloretta Arley NOVAK, MD  loteprednol  (LOTEMAX ) 0.5 % ophthalmic suspension Place 1 drop into the right eye 4 (four) times daily for 7 days, THEN 1 drop 2  (two) times daily for 7 days. 06/25/24 08/07/24    magic mouthwash (nystatin , diphenhydrAMINE , alum & mag hydroxide) suspension mixture Swish and spit 5 mLs 3 (three) times daily as needed for mouth sores. Patient not taking: Reported on 07/18/2024 05/08/24   Debby Olam POUR, NP  magnesium  oxide (MAG-OX) 400 (240 Mg) MG tablet Take 1 tablet (400 mg total) by mouth 2 (two) times daily. 03/30/23   Love, Sharlet RAMAN, PA-C  metoprolol  tartrate (LOPRESSOR ) 50 MG tablet Take 1 tablet (50 mg total) by mouth 3 (three) times daily. 03/30/23   Love, Sharlet RAMAN, PA-C  ondansetron  (ZOFRAN ) 4 MG tablet Take 1 tablet (4 mg total) by mouth every 6 (six) hours as needed. 06/05/24   Cloretta Arley NOVAK, MD  oxyCODONE  (OXY IR/ROXICODONE ) 5 MG immediate release tablet Take 1 tablet (5 mg total) by mouth every 4 (four) hours as needed for severe pain (pain score 7-10). 07/18/24   Debby Olam POUR, NP  potassium chloride  SA (KLOR-CON  M) 20 MEQ tablet Take 1 tablet (20 mEq total) by mouth daily. 02/08/24   Cloretta Arley NOVAK, MD    Allergies: Simvastatin, Zoloft [sertraline], Codeine, Coreg, Lisinopril, and Tizanidine hcl    Review of Systems  Constitutional:  Negative for chills and fever.  Respiratory:  Negative for shortness of breath.   Cardiovascular:  Negative for chest pain.  Gastrointestinal:  Negative for abdominal pain.  Musculoskeletal:  Negative for back pain.  Skin:  Positive for wound.  All other systems reviewed and are negative.   Updated Vital Signs BP 120/73 (BP Location: Right Arm)   Pulse 81   Temp 98.2 F (36.8 C) (Oral)   Resp 18   Ht 5' 5 (1.651 m)   Wt 88.9 kg   SpO2 93%   BMI 32.62 kg/m   Physical Exam Vitals and nursing note reviewed.  Constitutional:      Appearance: Normal appearance.  HENT:     Head: Normocephalic and atraumatic.     Mouth/Throat:     Mouth: Mucous membranes are moist.  Cardiovascular:     Rate and Rhythm: Normal rate.     Pulses:          Dorsalis pedis pulses are 2+ on  the right side.       Posterior tibial pulses are 1+ on the right side.  Pulmonary:     Effort: Pulmonary effort is normal.     Breath sounds: No wheezing.  Abdominal:     General: Abdomen is flat.  Musculoskeletal:        General: Swelling and tenderness present.     Cervical back: Normal range of motion and neck supple.  Feet:     Right foot:     Skin integrity: Erythema present. No warmth, callus or dry skin.     Comments: 2+, DP pulses.  Skin changes such as screening to purple bruising to the dorsum aspect.  Has good range of motion with plantarflexion and dorsiflexion.  No pain along the lateral or medial malleolus. Skin:    General: Skin is warm and dry.  Neurological:     Mental Status: She is alert and oriented to person, place, and time.     (all labs ordered are listed, but only abnormal results are displayed) Labs Reviewed - No data to display  EKG: None  Radiology: CT Head Wo Contrast Result Date: 07/22/2024 CLINICAL DATA:  Head trauma EXAM: CT HEAD WITHOUT CONTRAST TECHNIQUE: Contiguous axial images were obtained from the base of the skull through the vertex without intravenous contrast. RADIATION DOSE REDUCTION: This exam was performed according to the departmental dose-optimization program which includes automated exposure control, adjustment of the mA and/or kV according to patient size and/or use of iterative reconstruction technique. COMPARISON:  CT brain 07/18/2024, MRI 03/07/2023 FINDINGS: Brain: Negative for acute intracranial hemorrhage. Mild chronic small vessel ischemic changes of the white matter. Redemonstrated asymmetrical hypodensity within the left frontal lobe white matter, and slight ex vacuo enlargement of the left frontal horn. The ventricle size appears stable compared to prior exam several days ago but might be slightly enlarged compared to the MRI from 2024. Vascular: No hyperdense vessels.  Carotid vascular calcification Skull: No fracture  Sinuses/Orbits: Heterogeneous material in the right maxillary sinus with punctate calcifications. Other: None IMPRESSION: 1. Negative for acute intracranial hemorrhage. 2. Short-term stable appearance of asymmetrical hypodensity within the left frontal lobe, which could be sequela of prior infarction, but given change in appearance compared with MRI from 2024 and history of metastatic lung cancer, consider correlation with MRI to confirm 3. Ventricles appear slightly enlarged, this is a stable finding compared with the head CT several days prior but appears slightly more prominent compared to the MRI from May 2024 Electronically Signed   By: Luke Bun M.D.   On: 07/22/2024 17:33  DG Foot Complete Right Result Date: 07/22/2024 CLINICAL DATA:  Bruising after fall EXAM: RIGHT FOOT COMPLETE - 3+ VIEW COMPARISON:  None Available. FINDINGS: No definitive acute fracture or malalignment. Benign-appearing smooth sclerosis in the first distal phalanx. Mild degenerative change at the first MTP joint. IMPRESSION: No definitive acute osseous abnormality. Electronically Signed   By: Luke Bun M.D.   On: 07/22/2024 17:10     Procedures   Medications Ordered in the ED - No data to display                                  Medical Decision Making Amount and/or Complexity of Data Reviewed Radiology: ordered.   Patient presents to the ED status post mechanical fall.  Patient reports she tripped on her right foot and fell to the ground.  She did strike the back of her head with her sister's chin.  She is not endorsing any headache at this time, no loss of consciousness.  She is anticoagulated on Eliquis .  She reports pain around the dorsum aspect of her foot.  Notable bruise, pulses are present, she does have good range of motion although with some pain.  We discussed RICE therapy.  X-ray obtained did not show any fracture versus dislocation.  A CT scan of her head was obtained and it did not show any  intracranial hemorrhage.  She does have some changes consistent with likely a prior infarct.  Patient is followed for this by her PCP and actually has a repeat MRI on Friday, this is 3 days from now.  She is overall well-appearing with a steady gait.  Patient is hemodynamically stable for discharge.    Portions of this note were generated with Scientist, clinical (histocompatibility and immunogenetics). Dictation errors may occur despite best attempts at proofreading.   Final diagnoses:  Right foot pain  Fall, initial encounter    ED Discharge Orders     None          Maureen Broad, [place] 07/22/24 1751    Mannie Pac T, DO 07/23/24 1624

## 2024-07-22 NOTE — Telephone Encounter (Signed)
 Patient reports that she fell trying to get in the car this afternoon. She landed on her buttocks and lower back. Did not hit her head. States her right foot hurts and looks swollen. Instructed her to have sister take her [facility] emergency room to be evaluated and may need xray. They agree.

## 2024-07-22 NOTE — ED Triage Notes (Signed)
 Pt POV after mechanical fall while trying to get in the car, fell on ground, reporting R foot pain, on Eliquis , hit head on daughter's leg, no LOC.

## 2024-07-25 ENCOUNTER — Other Ambulatory Visit: Payer: Self-pay | Admitting: Radiation Therapy

## 2024-07-25 ENCOUNTER — Telehealth: Payer: Self-pay | Admitting: Nurse Practitioner

## 2024-07-25 ENCOUNTER — Telehealth: Payer: Self-pay | Admitting: *Deleted

## 2024-07-25 ENCOUNTER — Ambulatory Visit (HOSPITAL_BASED_OUTPATIENT_CLINIC_OR_DEPARTMENT_OTHER)
Admission: RE | Admit: 2024-07-25 | Discharge: 2024-07-25 | Disposition: A | Discharge status: Home / Self Care | Source: Ambulatory Visit | Attending: Oncology | Admitting: Oncology

## 2024-07-25 DIAGNOSIS — C3490 Malignant neoplasm of unspecified part of unspecified bronchus or lung: Secondary | ICD-10-CM

## 2024-07-25 DIAGNOSIS — C7931 Secondary malignant neoplasm of brain: Secondary | ICD-10-CM

## 2024-07-25 MED ORDER — DEXAMETHASONE 4 MG PO TABS: 4.0000 mg | ORAL_TABLET | Freq: Every day | ORAL | 1 refills | Status: DC

## 2024-07-25 MED ORDER — GADOBUTROL 1 MMOL/ML IV SOLN
9.0000 mL | Freq: Once | INTRAVENOUS | Status: AC | PRN
  Administered 2024-07-25: 9 mL via INTRAVENOUS
  Filled 2024-07-25: qty 10

## 2024-07-25 NOTE — Telephone Encounter (Signed)
 Received call from radiology regarding MRI brain completed earlier today.  Numerous brain metastases, no significant edema.  I reviewed with Dr. Cloretta.  I spoke with Ms. Diesel.  Plan for dexamethasone  4 mg daily and referral to radiation oncology.

## 2024-07-25 NOTE — Telephone Encounter (Signed)
 Spoke w/Angela in radiation oncology to call attention to urgent referral placed in [facility] to see Dr. Patrcia for new, numerous brain mets on MRI today. Has been started on dexamethsone 4 mg daily. Was told his schedule is full, but she will send secure chat to Dr. Patrcia. INstructed them to be sure to call her sister, Slater with the appointment.

## 2024-07-27 ENCOUNTER — Other Ambulatory Visit: Payer: Self-pay

## 2024-07-28 ENCOUNTER — Telehealth: Payer: Self-pay | Admitting: Radiation Therapy

## 2024-07-28 NOTE — Telephone Encounter (Addendum)
 I called Slater back to ask more questions about Ms. Punches's inability to stand or walk on her own.   Per Slater, the patient was diagnosed with leg tremors many hears ago, and has had difficulty walking on her own but was able to get up and walk short distances until recently. Now she is unable to stand or walk without assistance and it takes two people to get her up due to her legs shaking and giving out.  She does complain of pain in her mid back at the area of her bra strap.  No numbness in legs or loss of bladder or bowel function.  She had radiofrequency OsteoCool ablation and bi-pedicular cement augmentation with balloon kyphoplasty on 11/06/2022 to L2.  Slater mentioned that the pain in that area did improve for a short while but has returned and is very difficult for her.   Devere Perch R.T(R)(T) Radiation Special Procedures Lead

## 2024-07-29 ENCOUNTER — Telehealth: Payer: Self-pay | Admitting: Oncology

## 2024-07-29 NOTE — Progress Notes (Incomplete)
 Location/Histology of Brain Tumor: Multiple Sites in Brain  Patient presented as referral from Dr. Arley Hof ([facility])  Past or anticipated interventions, if any, per neurosurgery:  NA  Past or anticipated interventions, if any, per medical oncology:  Dr. Arley Hof    Dose of Decadron , if applicable: Decadron  4 mg po daily.  Recent neurologic symptoms, if any:  Seizures: {:18581} Headaches: {:18581} Nausea: {:18581} Dizziness/ataxia: {:18581} Difficulty with hand coordination: {:18581} Focal numbness/weakness: {:18581} Visual deficits/changes: {:18581} Confusion/Memory deficits: {:18581}  Painful bone metastases at present, if any: {:18581}  SAFETY ISSUES: Prior radiation? {:18581} Pacemaker/ICD? {:18581} Possible current pregnancy?  Hysterectomy Is the patient on methotrexate? No  Additional Complaints / other details:

## 2024-07-29 NOTE — Telephone Encounter (Signed)
 HAS AN URGENT APPT WITH DR MANNING, WILL CALL TO RESCHEDULE APPT WITH [facility]. WOULD LIKE FOR NURSE TO CALL HER BACK.

## 2024-07-30 ENCOUNTER — Emergency Department (HOSPITAL_COMMUNITY)

## 2024-07-30 ENCOUNTER — Other Ambulatory Visit: Payer: Self-pay

## 2024-07-30 ENCOUNTER — Telehealth: Payer: Self-pay | Admitting: Radiation Oncology

## 2024-07-30 ENCOUNTER — Inpatient Hospital Stay (HOSPITAL_COMMUNITY)
Admission: EM | Admit: 2024-07-30 | Discharge: 2024-08-02 | DRG: 180 | Disposition: A | Discharge status: Hospice | Source: Skilled Nursing Facility | Attending: Family Medicine | Admitting: Family Medicine

## 2024-07-30 ENCOUNTER — Encounter (HOSPITAL_COMMUNITY): Payer: Self-pay | Admitting: Internal Medicine

## 2024-07-30 DIAGNOSIS — Z66 Do not resuscitate: Secondary | ICD-10-CM | POA: Diagnosis present

## 2024-07-30 DIAGNOSIS — Z515 Encounter for palliative care: Secondary | ICD-10-CM

## 2024-07-30 DIAGNOSIS — L89323 Pressure ulcer of left buttock, stage 3: Secondary | ICD-10-CM | POA: Diagnosis present

## 2024-07-30 DIAGNOSIS — D6481 Anemia due to antineoplastic chemotherapy: Secondary | ICD-10-CM | POA: Diagnosis present

## 2024-07-30 DIAGNOSIS — B370 Candidal stomatitis: Secondary | ICD-10-CM

## 2024-07-30 DIAGNOSIS — Y92129 Unspecified place in nursing home as the place of occurrence of the external cause: Secondary | ICD-10-CM

## 2024-07-30 DIAGNOSIS — I428 Other cardiomyopathies: Secondary | ICD-10-CM | POA: Diagnosis present

## 2024-07-30 DIAGNOSIS — I69151 Hemiplegia and hemiparesis following nontraumatic intracerebral hemorrhage affecting right dominant side: Secondary | ICD-10-CM | POA: Diagnosis not present

## 2024-07-30 DIAGNOSIS — R296 Repeated falls: Secondary | ICD-10-CM | POA: Diagnosis present

## 2024-07-30 DIAGNOSIS — Z823 Family history of stroke: Secondary | ICD-10-CM

## 2024-07-30 DIAGNOSIS — I2699 Other pulmonary embolism without acute cor pulmonale: Secondary | ICD-10-CM | POA: Diagnosis present

## 2024-07-30 DIAGNOSIS — K7460 Unspecified cirrhosis of liver: Secondary | ICD-10-CM | POA: Diagnosis present

## 2024-07-30 DIAGNOSIS — I130 Hypertensive heart and chronic kidney disease with heart failure and stage 1 through stage 4 chronic kidney disease, or unspecified chronic kidney disease: Secondary | ICD-10-CM | POA: Diagnosis present

## 2024-07-30 DIAGNOSIS — W19XXXA Unspecified fall, initial encounter: Principal | ICD-10-CM | POA: Diagnosis present

## 2024-07-30 DIAGNOSIS — Z7901 Long term (current) use of anticoagulants: Secondary | ICD-10-CM | POA: Diagnosis not present

## 2024-07-30 DIAGNOSIS — I3139 Other pericardial effusion (noninflammatory): Secondary | ICD-10-CM | POA: Diagnosis present

## 2024-07-30 DIAGNOSIS — I5032 Chronic diastolic (congestive) heart failure: Secondary | ICD-10-CM | POA: Diagnosis present

## 2024-07-30 DIAGNOSIS — Z7189 Other specified counseling: Secondary | ICD-10-CM | POA: Diagnosis not present

## 2024-07-30 DIAGNOSIS — N1832 Chronic kidney disease, stage 3b: Secondary | ICD-10-CM | POA: Diagnosis present

## 2024-07-30 DIAGNOSIS — J9809 Other diseases of bronchus, not elsewhere classified: Secondary | ICD-10-CM | POA: Diagnosis present

## 2024-07-30 DIAGNOSIS — Z8419 Family history of other disorders of kidney and ureter: Secondary | ICD-10-CM

## 2024-07-30 DIAGNOSIS — C3490 Malignant neoplasm of unspecified part of unspecified bronchus or lung: Principal | ICD-10-CM | POA: Diagnosis present

## 2024-07-30 DIAGNOSIS — C7931 Secondary malignant neoplasm of brain: Secondary | ICD-10-CM | POA: Diagnosis present

## 2024-07-30 DIAGNOSIS — R5381 Other malaise: Secondary | ICD-10-CM | POA: Diagnosis present

## 2024-07-30 DIAGNOSIS — T451X5A Adverse effect of antineoplastic and immunosuppressive drugs, initial encounter: Secondary | ICD-10-CM | POA: Diagnosis present

## 2024-07-30 DIAGNOSIS — F419 Anxiety disorder, unspecified: Secondary | ICD-10-CM | POA: Diagnosis present

## 2024-07-30 DIAGNOSIS — Z923 Personal history of irradiation: Secondary | ICD-10-CM

## 2024-07-30 DIAGNOSIS — E7800 Pure hypercholesterolemia, unspecified: Secondary | ICD-10-CM | POA: Diagnosis present

## 2024-07-30 DIAGNOSIS — Z8249 Family history of ischemic heart disease and other diseases of the circulatory system: Secondary | ICD-10-CM | POA: Diagnosis not present

## 2024-07-30 DIAGNOSIS — Z8572 Personal history of non-Hodgkin lymphomas: Secondary | ICD-10-CM

## 2024-07-30 DIAGNOSIS — K219 Gastro-esophageal reflux disease without esophagitis: Secondary | ICD-10-CM | POA: Diagnosis present

## 2024-07-30 DIAGNOSIS — I69192 Facial weakness following nontraumatic intracerebral hemorrhage: Secondary | ICD-10-CM

## 2024-07-30 DIAGNOSIS — Z885 Allergy status to narcotic agent status: Secondary | ICD-10-CM

## 2024-07-30 DIAGNOSIS — C7951 Secondary malignant neoplasm of bone: Secondary | ICD-10-CM | POA: Diagnosis present

## 2024-07-30 DIAGNOSIS — Z9181 History of falling: Secondary | ICD-10-CM

## 2024-07-30 DIAGNOSIS — C782 Secondary malignant neoplasm of pleura: Secondary | ICD-10-CM | POA: Diagnosis present

## 2024-07-30 DIAGNOSIS — I10 Essential (primary) hypertension: Secondary | ICD-10-CM | POA: Diagnosis present

## 2024-07-30 DIAGNOSIS — Z79899 Other long term (current) drug therapy: Secondary | ICD-10-CM

## 2024-07-30 DIAGNOSIS — R29810 Facial weakness: Secondary | ICD-10-CM | POA: Diagnosis present

## 2024-07-30 DIAGNOSIS — Z8269 Family history of other diseases of the musculoskeletal system and connective tissue: Secondary | ICD-10-CM

## 2024-07-30 DIAGNOSIS — Z888 Allergy status to other drugs, medicaments and biological substances status: Secondary | ICD-10-CM

## 2024-07-30 DIAGNOSIS — D630 Anemia in neoplastic disease: Secondary | ICD-10-CM | POA: Diagnosis present

## 2024-07-30 DIAGNOSIS — Z853 Personal history of malignant neoplasm of breast: Secondary | ICD-10-CM

## 2024-07-30 LAB — CBC WITH DIFFERENTIAL/PLATELET
Abs Immature Granulocytes: 0.04 K/uL (ref 0.00–0.07)
Basophils Absolute: 0 K/uL (ref 0.0–0.1)
Basophils Relative: 0 %
Eosinophils Absolute: 0 K/uL (ref 0.0–0.5)
Eosinophils Relative: 0 %
HCT: 44.5 % (ref 36.0–46.0)
Hemoglobin: 14.2 g/dL (ref 12.0–15.0)
Immature Granulocytes: 0 %
Lymphocytes Relative: 12 %
Lymphs Abs: 1.2 K/uL (ref 0.7–4.0)
MCH: 34.5 pg — ABNORMAL HIGH (ref 26.0–34.0)
MCHC: 31.9 g/dL (ref 30.0–36.0)
MCV: 108.3 fL — ABNORMAL HIGH (ref 80.0–100.0)
Monocytes Absolute: 0.9 K/uL (ref 0.1–1.0)
Monocytes Relative: 9 %
Neutro Abs: 7.9 K/uL — ABNORMAL HIGH (ref 1.7–7.7)
Neutrophils Relative %: 79 %
Platelets: 263 K/uL (ref 150–400)
RBC: 4.11 MIL/uL (ref 3.87–5.11)
RDW: 18.2 % — ABNORMAL HIGH (ref 11.5–15.5)
WBC: 10.1 K/uL (ref 4.0–10.5)
nRBC: 0 % (ref 0.0–0.2)

## 2024-07-30 LAB — COMPREHENSIVE METABOLIC PANEL WITH GFR
ALT: 15 U/L (ref 0–44)
AST: 24 U/L (ref 15–41)
Albumin: 3.2 g/dL — ABNORMAL LOW (ref 3.5–5.0)
Alkaline Phosphatase: 68 U/L (ref 38–126)
Anion gap: 16 — ABNORMAL HIGH (ref 5–15)
BUN: 24 mg/dL — ABNORMAL HIGH (ref 8–23)
CO2: 26 mmol/L (ref 22–32)
Calcium: 8.7 mg/dL — ABNORMAL LOW (ref 8.9–10.3)
Chloride: 99 mmol/L (ref 98–111)
Creatinine, Ser: 1.42 mg/dL — ABNORMAL HIGH (ref 0.44–1.00)
GFR, Estimated: 38 mL/min — ABNORMAL LOW (ref >60)
Glucose, Bld: 121 mg/dL — ABNORMAL HIGH (ref 70–99)
Potassium: 3.5 mmol/L (ref 3.5–5.1)
Sodium: 141 mmol/L (ref 135–145)
Total Bilirubin: 0.7 mg/dL (ref 0.0–1.2)
Total Protein: 6.8 g/dL (ref 6.5–8.1)

## 2024-07-30 LAB — TROPONIN I (HIGH SENSITIVITY)
Troponin I (High Sensitivity): 104 ng/L (ref <18)
Troponin I (High Sensitivity): 120 ng/L (ref <18)
Troponin I (High Sensitivity): 99 ng/L — ABNORMAL HIGH (ref <18)
Troponin I (High Sensitivity): 100 ng/L (ref <18)

## 2024-07-30 LAB — PROTIME-INR
INR: 1.3 — ABNORMAL HIGH (ref 0.8–1.2)
Prothrombin Time: 16.9 s — ABNORMAL HIGH (ref 11.4–15.2)

## 2024-07-30 LAB — I-STAT CHEM 8, ED
BUN: 29 mg/dL — ABNORMAL HIGH (ref 8–23)
Calcium, Ion: 0.94 mmol/L — ABNORMAL LOW (ref 1.15–1.40)
Chloride: 102 mmol/L (ref 98–111)
Creatinine, Ser: 1.4 mg/dL — ABNORMAL HIGH (ref 0.44–1.00)
Glucose, Bld: 119 mg/dL — ABNORMAL HIGH (ref 70–99)
HCT: 45 % (ref 36.0–46.0)
Hemoglobin: 15.3 g/dL — ABNORMAL HIGH (ref 12.0–15.0)
Potassium: 3.5 mmol/L (ref 3.5–5.1)
Sodium: 139 mmol/L (ref 135–145)
TCO2: 28 mmol/L (ref 22–32)

## 2024-07-30 LAB — CBG MONITORING, ED: Glucose-Capillary: 121 mg/dL — ABNORMAL HIGH (ref 70–99)

## 2024-07-30 LAB — CK: Total CK: 49 U/L (ref 38–234)

## 2024-07-30 MED ORDER — ACETAMINOPHEN 325 MG PO TABS
650.0000 mg | ORAL_TABLET | Freq: Four times a day (QID) | ORAL | Status: DC | PRN
  Administered 2024-08-02: 650 mg via ORAL
  Filled 2024-07-30: qty 2

## 2024-07-30 MED ORDER — DEXAMETHASONE SODIUM PHOSPHATE 4 MG/ML IJ SOLN
4.0000 mg | Freq: Once | INTRAMUSCULAR | Status: AC
  Administered 2024-07-30: 4 mg via INTRAVENOUS
  Filled 2024-07-30: qty 1

## 2024-07-30 MED ORDER — APIXABAN 5 MG PO TABS
5.0000 mg | ORAL_TABLET | Freq: Two times a day (BID) | ORAL | Status: DC
  Filled 2024-07-30: qty 1

## 2024-07-30 MED ORDER — ACETAMINOPHEN 650 MG RE SUPP: 650.0000 mg | Freq: Four times a day (QID) | RECTAL | Status: DC | PRN

## 2024-07-30 MED ORDER — DEXAMETHASONE 4 MG PO TABS
4.0000 mg | ORAL_TABLET | Freq: Every day | ORAL | Status: DC
  Administered 2024-08-01: 4 mg via ORAL
  Filled 2024-07-30 (×3): qty 1

## 2024-07-30 MED ORDER — LACTATED RINGERS IV SOLN: INTRAVENOUS | Status: DC

## 2024-07-30 MED ORDER — POLYVINYL ALCOHOL 1.4 % OP SOLN
2.0000 [drp] | Freq: Three times a day (TID) | OPHTHALMIC | Status: DC
Start: 2024-07-30 — End: 2024-08-02
  Administered 2024-07-30 – 2024-08-02 (×9): 2 [drp] via OPHTHALMIC
  Filled 2024-07-30 (×2): qty 15

## 2024-07-30 MED ORDER — FENTANYL CITRATE PF 50 MCG/ML IJ SOSY
50.0000 ug | PREFILLED_SYRINGE | Freq: Once | INTRAMUSCULAR | Status: AC
  Administered 2024-07-30: 50 ug via INTRAVENOUS
  Filled 2024-07-30: qty 1

## 2024-07-30 MED ORDER — METOPROLOL TARTRATE 50 MG PO TABS
50.0000 mg | ORAL_TABLET | Freq: Three times a day (TID) | ORAL | Status: DC
  Administered 2024-07-31 – 2024-08-02 (×7): 50 mg via ORAL
  Filled 2024-07-30 (×7): qty 1

## 2024-07-30 MED ORDER — LORAZEPAM 0.5 MG PO TABS: 0.5000 mg | ORAL_TABLET | Freq: Two times a day (BID) | ORAL | Status: DC | PRN

## 2024-07-30 MED ORDER — LACTATED RINGERS IV SOLN: INTRAVENOUS | Status: AC

## 2024-07-30 MED ORDER — LIDOCAINE-PRILOCAINE 2.5-2.5 % EX CREA: TOPICAL_CREAM | Freq: Every day | CUTANEOUS | Status: DC | PRN

## 2024-07-30 MED ORDER — SENNOSIDES-DOCUSATE SODIUM 8.6-50 MG PO TABS: 1.0000 | ORAL_TABLET | Freq: Every evening | ORAL | Status: DC | PRN

## 2024-07-30 MED ORDER — HYDROMORPHONE HCL 1 MG/ML IJ SOLN
0.5000 mg | INTRAMUSCULAR | Status: DC | PRN
  Administered 2024-07-30: 0.5 mg via INTRAVENOUS
  Filled 2024-07-30: qty 0.5

## 2024-07-30 MED ORDER — ONDANSETRON HCL 4 MG PO TABS
4.0000 mg | ORAL_TABLET | Freq: Four times a day (QID) | ORAL | Status: DC | PRN
Start: 2024-07-30 — End: 2024-08-02

## 2024-07-30 NOTE — ED Notes (Signed)
 Extra Blue & DG drawn

## 2024-07-30 NOTE — ED Provider Notes (Addendum)
 [place] EMERGENCY DEPARTMENT AT [facility] Provider Note   CSN: [id number] Arrival date & time: 07/30/24  1027     Patient presents with: [place]   Crystal [name] is a 78 y.o. female.   78 year old female who presents from a facility after being found down on the ground.  She does have a history of being on Eliquis .  She has a history of metastatic cancer to the brain as well unfortunately.  Last seen was over 8 hours.  When she was found this morning in facility she was confused and complaining of neck and back pain.  Per EMS, patient's blood sugar was 134.  She was able to protect her airway.  Was placed in c-collar and transported here.  Patient's medical power of attorney is at bedside and states patient is a DNR.       Prior to Admission medications   Medication Sig Start Date End Date Taking? Authorizing Provider  apixaban  (ELIQUIS ) 5 MG TABS tablet Take 1 tablet (5 mg total) by mouth 2 (two) times daily. 03/30/23   Love, Sharlet RAMAN, PA-C  dexamethasone  (DECADRON ) 4 MG tablet Take 1 tablet (4 mg total) by mouth daily. 07/25/24   Debby Olam POUR, NP  ferrous sulfate  325 (65 FE) MG tablet Take 1 tablet (325 mg total) by mouth daily with lunch. 02/08/24   Cloretta Arley NOVAK, MD  folic acid  (FOLVITE ) 1 MG tablet Take 1 tablet (1 mg total) by mouth daily. 03/30/23   Love, Sharlet RAMAN, PA-C  lidocaine -prilocaine  (EMLA ) cream Apply 1 Application topically as directed. Apply to [facility] site 2 hours prior to stick and cover with plastic wrap to numb site 04/16/24   Cloretta Arley NOVAK, MD  LORazepam  (ATIVAN ) 0.5 MG tablet Take 1 tablet (0.5 mg total) by mouth 2 (two) times daily as needed for anxiety. 03/13/24   Cloretta Arley NOVAK, MD  loteprednol  (LOTEMAX ) 0.5 % ophthalmic suspension Place 1 drop into the right eye 4 (four) times daily for 7 days, THEN 1 drop 2 (two) times daily for 7 days. 06/25/24 08/07/24    magic mouthwash (nystatin , diphenhydrAMINE , alum & mag hydroxide) suspension mixture Swish and  spit 5 mLs 3 (three) times daily as needed for mouth sores. Patient not taking: Reported on 07/18/2024 05/08/24   Debby Olam POUR, NP  magnesium  oxide (MAG-OX) 400 (240 Mg) MG tablet Take 1 tablet (400 mg total) by mouth 2 (two) times daily. 03/30/23   Love, Sharlet RAMAN, PA-C  metoprolol  tartrate (LOPRESSOR ) 50 MG tablet Take 1 tablet (50 mg total) by mouth 3 (three) times daily. 03/30/23   Love, Sharlet RAMAN, PA-C  ondansetron  (ZOFRAN ) 4 MG tablet Take 1 tablet (4 mg total) by mouth every 6 (six) hours as needed. 06/05/24   Cloretta Arley NOVAK, MD  oxyCODONE  (OXY IR/ROXICODONE ) 5 MG immediate release tablet Take 1 tablet (5 mg total) by mouth every 4 (four) hours as needed for severe pain (pain score 7-10). 07/18/24   Debby Olam POUR, NP  potassium chloride  SA (KLOR-CON  M) 20 MEQ tablet Take 1 tablet (20 mEq total) by mouth daily. 02/08/24   Cloretta Arley NOVAK, MD    Allergies: Simvastatin, Zoloft [sertraline], Codeine, Coreg, Lisinopril, and Tizanidine hcl    Review of Systems  Unable to perform ROS: Dementia    Updated Vital Signs BP (!) 140/106   Temp (!) 97.5 F (36.4 C) (Temporal)   Resp (!) 21   SpO2 96%   Physical Exam Vitals and nursing  note reviewed.  Constitutional:      General: She is not in acute distress.    Appearance: Normal appearance. She is well-developed. She is not toxic-appearing.  HENT:     Head: Normocephalic and atraumatic.  Eyes:     General: Lids are normal.     Conjunctiva/sclera: Conjunctivae normal.     Pupils: Pupils are equal, round, and reactive to light.  Neck:     Thyroid : No thyroid  mass.     Trachea: No tracheal deviation.  Cardiovascular:     Rate and Rhythm: Normal rate and regular rhythm.     Heart sounds: Normal heart sounds. No murmur heard.    No gallop.  Pulmonary:     Effort: Pulmonary effort is normal. No respiratory distress.     Breath sounds: Normal breath sounds. No stridor. No decreased breath sounds, wheezing, rhonchi or rales.  Abdominal:      General: There is no distension.     Palpations: Abdomen is soft.     Tenderness: There is no abdominal tenderness. There is no rebound.  Musculoskeletal:        General: No tenderness. Normal range of motion.     Cervical back: Normal range of motion and neck supple.       Back:  Skin:    General: Skin is warm and dry.     Findings: No abrasion or rash.  Neurological:     General: No focal deficit present.     Mental Status: She is disoriented and confused.     GCS: GCS eye subscore is 4. GCS verbal subscore is 5. GCS motor subscore is 6.     Cranial Nerves: No cranial nerve deficit.     Sensory: No sensory deficit.     Motor: Motor function is intact.  Psychiatric:        Attention and Perception: She is inattentive.        Mood and Affect: Affect is blunt and flat.        Behavior: Behavior is withdrawn.     (all labs ordered are listed, but only abnormal results are displayed) Labs Reviewed  CK  CBC WITH DIFFERENTIAL/PLATELET  COMPREHENSIVE METABOLIC PANEL WITH GFR  URINALYSIS, W/ REFLEX TO CULTURE (INFECTION SUSPECTED)  I-STAT CHEM 8, ED  TROPONIN I (HIGH SENSITIVITY)    EKG: EKG Interpretation Date/Time:  Wednesday July 30 2024 10:51:01 EDT Ventricular Rate:  100 PR Interval:  100 QRS Duration:  111 QT Interval:  370 QTC Calculation: 478 R Axis:   -54  Text Interpretation: Sinus tachycardia Left anterior fascicular block Abnormal R-wave progression, early transition Probable left ventricular hypertrophy No significant change since last tracing Confirmed by Dasie Faden ([id number]) on 07/30/2024 12:42:29 PM  Radiology: No results found.   Procedures   Medications Ordered in the ED  lactated ringers  infusion (has no administration in time range)                                    Medical Decision Making Amount and/or Complexity of Data Reviewed Labs: ordered. Radiology: ordered. ECG/medicine tests: ordered.  Risk Prescription drug  management.   Patient's EKG shows sinus tachycardia.  Patient x-rays of her chest and pelvis which showed no acute injuries.  Laboratory studies significant for elevated troponin.  Patient denies any chest pain at this time.  CT head and cervical spine showed no acute fractures but did show  some mildly progressed edema in the posterior left middle lobe of her brain due to known intracranial metastasis.  No mass effect was noted.  CK was normal.  No evidence of rhabdomyolysis.  No leukocytosis on CBC.  Patient afebrile here.  Low suspicion for sepsis.  Patient reevaluated multiple times with patient's niece at bedside who states that patient is still altered.  This could be from her intracranial findings.  Possibility of NSTEMI as well given her elevated troponin although I think this is probably demand ischemia given her tachycardia.  Patient on Eliquis  and will hold off aspirin.  Discussion about CODE STATUS and patient is DNR.  Will consult hospitalist team for admission.  CRITICAL CARE Performed by: Curtistine ONEIDA Dawn Total critical care time: 60 minutes Critical care time was exclusive of separately billable procedures and treating other patients. Critical care was necessary to treat or prevent imminent or life-threatening deterioration. Critical care was time spent personally by me on the following activities: development of treatment plan with patient and/or surrogate as well as nursing, discussions with consultants, evaluation of patient's response to treatment, examination of patient, obtaining history from patient or surrogate, ordering and performing treatments and interventions, ordering and review of laboratory studies, ordering and review of radiographic studies, pulse oximetry and re-evaluation of patient's condition.      Final diagnoses:  None    ED Discharge Orders     None          Dawn Curtistine, MD 07/30/24 1245    Dawn Curtistine, MD 07/30/24 1247

## 2024-07-30 NOTE — Telephone Encounter (Signed)
 Received call from one of pt's sisters advising pt had a fall yesterday and is now in ED. She advised pt will be getting admitted so they will not be able to come to consultation appt for 10/9. She advised they are waiting on chest xray results but that head CT indicated edema in brain; no broken bones found as of right now. Sister was unsure of our team's procedure but wanted to let us  know. She was advised Dr. Alline team would be made aware via inbasket and Brain Navigator would be in contact to update on RadOnc plans after discussing with team.
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
## 2024-07-30 NOTE — Progress Notes (Signed)
  [facility]         [phone] ________________________________  Inpatient Chart Note  Name: Crystal [name] MRN: [id number]  Date: 07/30/2024  DOB: 01-06-1946   Narrative: I was scheduled to meet this patient tomorrow as an outpatient consultation.  She had been referred for multiple brain metastases and consideration of whole brain irradiation versus stereotactic radiosurgery.  However, her condition has significantly deteriorated over the past 2 weeks including falls and leg weakness.  She was brought to the [facility] emergency room today and admitted to the hospital.  In reviewing her brain MRI and other imaging earlier this week and following her course, it has become increasingly clear that the patient's performance status has declined, with a KPS now less than 40.  Patients with a poor performance status and multiple brain metastases derive little if any benefit from brain irradiation.  It is possible but treating her brain with radiation at this point may cause more harm than good.  I have spoken with her care team via secure chat and I also called and spoke with her healthcare power of attorney, Crystal [name].  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  We discussed the potential options related to radiation therapy and after reviewing the pros and cons, we mutually agreed that the patient is not an appropriate candidate for brain radiation.  At this time, I suspect comfort care may be in her best interest.  [name] plans to share our conversation with the patient's sisters and I presume oncology will weigh in regarding further treatment versus hospice enrollment.  I am available and would be happy to talk further with the family as needed.  At this point, we will cancel radiation appointments.  -----------------------------------------------  KPS = 20  100 - Normal; no complaints; no evidence of disease. 90   - Able to carry on normal  activity; minor signs or symptoms of disease. 80   - Normal activity with effort; some signs or symptoms of disease. 52   - Cares for self; unable to carry on normal activity or to do active work. 60   - Requires occasional assistance, but is able to care for most of his personal needs. 50   - Requires considerable assistance and frequent medical care. 40   - Disabled; requires special care and assistance. 30   - Severely disabled; hospital admission is indicated although death not imminent. 20   - Very sick; hospital admission necessary; active supportive treatment necessary. 10   - Moribund; fatal processes progressing rapidly. 0     - Dead  Karnofsky DA, Abelmann WH, Craver LS and Burchenal [facility] [phone]) The use of the nitrogen mustards in the palliative treatment of carcinoma: with particular reference to bronchogenic carcinoma Cancer 1 [phone]  ------------------------------------------------   Donnice Barge, MD [facility]  Radiation Oncology Medical Director and Director of Stereotactic Radiosurgery Direct Dial: [phone]  Fax: [phone] [email or web address]  Skype  LinkedIn

## 2024-07-30 NOTE — ED Triage Notes (Signed)
 PT BIB [facility] after unwitnessed fall, found at 0900. PT recent dx of metastastis to brain. Normally Aox4, GCS 14 presently. Found on L side, last seen last night. Complaining of neck and back pain. Placed on 2L [place] en route. On thinners  BP 146/98, HR 100, RR 22, CBG 134, Spo294% RA

## 2024-07-31 ENCOUNTER — Inpatient Hospital Stay

## 2024-07-31 ENCOUNTER — Ambulatory Visit

## 2024-07-31 ENCOUNTER — Ambulatory Visit
Admission: RE | Admit: 2024-07-31 | Discharge: 2024-07-31 | Disposition: A | Discharge status: Home / Self Care | Source: Ambulatory Visit | Attending: Radiation Oncology | Admitting: Radiation Oncology

## 2024-07-31 ENCOUNTER — Inpatient Hospital Stay: Admitting: Nurse Practitioner

## 2024-07-31 DIAGNOSIS — C3490 Malignant neoplasm of unspecified part of unspecified bronchus or lung: Principal | ICD-10-CM

## 2024-07-31 DIAGNOSIS — Z7189 Other specified counseling: Secondary | ICD-10-CM

## 2024-07-31 DIAGNOSIS — C7931 Secondary malignant neoplasm of brain: Secondary | ICD-10-CM

## 2024-07-31 DIAGNOSIS — R296 Repeated falls: Secondary | ICD-10-CM | POA: Diagnosis not present

## 2024-07-31 DIAGNOSIS — Z515 Encounter for palliative care: Secondary | ICD-10-CM

## 2024-07-31 LAB — URINALYSIS, ROUTINE W REFLEX MICROSCOPIC
Bacteria, UA: NONE SEEN
Bilirubin Urine: NEGATIVE
Glucose, UA: NEGATIVE mg/dL
Ketones, ur: NEGATIVE mg/dL
Leukocytes,Ua: NEGATIVE
Nitrite: NEGATIVE
Protein, ur: NEGATIVE mg/dL
Specific Gravity, Urine: 1.013 (ref 1.005–1.030)
pH: 6 (ref 5.0–8.0)

## 2024-07-31 LAB — BASIC METABOLIC PANEL WITH GFR
Anion gap: 14 (ref 5–15)
BUN: 22 mg/dL (ref 8–23)
CO2: 26 mmol/L (ref 22–32)
Calcium: 8.5 mg/dL — ABNORMAL LOW (ref 8.9–10.3)
Chloride: 99 mmol/L (ref 98–111)
Creatinine, Ser: 1.21 mg/dL — ABNORMAL HIGH (ref 0.44–1.00)
GFR, Estimated: 46 mL/min — ABNORMAL LOW (ref >60)
Glucose, Bld: 122 mg/dL — ABNORMAL HIGH (ref 70–99)
Potassium: 3.7 mmol/L (ref 3.5–5.1)
Sodium: 139 mmol/L (ref 135–145)

## 2024-07-31 LAB — CBC
HCT: 41.2 % (ref 36.0–46.0)
Hemoglobin: 13.4 g/dL (ref 12.0–15.0)
MCH: 34.4 pg — ABNORMAL HIGH (ref 26.0–34.0)
MCHC: 32.5 g/dL (ref 30.0–36.0)
MCV: 105.9 fL — ABNORMAL HIGH (ref 80.0–100.0)
Platelets: 219 K/uL (ref 150–400)
RBC: 3.89 MIL/uL (ref 3.87–5.11)
RDW: 18.2 % — ABNORMAL HIGH (ref 11.5–15.5)
WBC: 6.7 K/uL (ref 4.0–10.5)
nRBC: 0 % (ref 0.0–0.2)

## 2024-07-31 MED ORDER — OXYCODONE HCL 20 MG/ML PO CONC: 5.0000 mg | ORAL | Status: DC | PRN

## 2024-07-31 MED ORDER — MORPHINE SULFATE (CONCENTRATE) 10 MG /0.5 ML PO SOLN: 5.0000 mg | ORAL | Status: DC | PRN

## 2024-07-31 NOTE — Progress Notes (Signed)
 PROGRESS NOTE    Crystal [name]  FMW:[id number] DOB: 02/27/1946 DOA: 07/30/2024 PCP: Anner Alm ORN, MD   Brief Narrative:  This 78 years old female with past medical history significant of non-small cell lung cancer with multiple metastasis including the brain, hypertension, HFpEF, CKD 3B presenting from her facility after a fall. She has right-sided facial droop and right-sided weakness that has been present for around 1 month.  Family reports patient has been deteriorating over the past few weeks with falls and more forgetfulness.  Patient was admitted for further evaluation.  Palliative care is consulted.  Family is agreeable to hospice.  Assessment & Plan:   Principal Problem:   Recurrent falls Active Problems:   Essential hypertension   Metastatic non-small cell lung cancer (HCC)   Chronic diastolic CHF (congestive heart failure), NYHA class 2 (HCC)   Declining functional status  Status post mechanical fall: Progressive metastatic lung cancer Patient with recurrent falls and worsening functional status which appears to be secondary to her progressive metastatic lung cancer.  It appears it has multiple metastases to her brain and to her spine.  Discussed with radiation oncology and they recommend palliative care approach with avoidance of radiation therapy as it provides more risks than benefits.  Patient's family understanding and agreeable to transitioning care to full comfort care.  They prefer patient to go to [facility].  Continue adequate pain control.  Oncology is following.  Eliquis  discontinued.    Metastatic non-small cell lung cancer:  Patient follows with Dr. Cloretta outpatient.  It appears recent office visit show progressive disease.  Palliative care consult placed.  Care transitioned to full comfort care.   Hypertension/HFpEF: Continue home beta-blocker.   CKDIIIb: renally dose med, avoid nephrotoxic meds.    DVT prophylaxis: SCDs Code Status:  DNR/comfort Family Communication: No family at bedside Disposition Plan:   Care now transition to full comfort care  Consultants:  Oncology  Procedures: None Antimicrobials: None  Subjective: Patient was seen and examined at bedside.  Overnight events noted. Patient was lying comfortably on the bed,  She is alert and oriented,  following commands.    Objective: Vitals:   07/30/24 1528 07/30/24 1640 07/30/24 2258 07/31/24 0500  BP:  130/86 131/79 (!) 140/88  Pulse:  (!) 105 (!) 104 (!) 108  Resp:  15 18 18   Temp: 98.1 F (36.7 C) 98.5 F (36.9 C) 97.7 F (36.5 C) 98.4 F (36.9 C)  TempSrc: Oral Oral Oral Oral  SpO2:  96% 97% 96%  Weight:      Height:        Intake/Output Summary (Last 24 hours) at 07/31/2024 1149 Last data filed at 07/31/2024 0852 Gross per 24 hour  Intake 371.25 ml  Output 800 ml  Net -428.75 ml   Filed Weights   07/30/24 1102  Weight: 87.5 kg    Examination:  General exam: Appears calm and comfortable, not in any acute distress, frail, severely deconditioned.[name] Respiratory system: Clear to auscultation. Respiratory effort normal. RR 18 Cardiovascular system: S1 & S2 heard, RRR. No JVD, murmurs, rubs, gallops or clicks. Gastrointestinal system: Abdomen is non distended, soft and non tender. Normal bowel sounds heard. Central nervous system: Alert and oriented X 2. No focal neurological deficits. Extremities: No edema, no cyanosis, no clubbing. Skin: No rashes, lesions or ulcers Psychiatry: Judgement and insight appear normal. Mood & affect appropriate.   Data Reviewed: I have personally reviewed following labs and imaging studies  CBC: Recent Labs  Lab  07/30/24 1042 07/30/24 1047 07/31/24 0434  WBC 10.1  --  6.7  NEUTROABS 7.9*  --   --   HGB 14.2 15.3* 13.4  HCT 44.5 45.0 41.2  MCV 108.3*  --  105.9*  PLT 263  --  219   Basic Metabolic Panel: Recent Labs  Lab 07/30/24 1042 07/30/24 1047 07/31/24 0434  NA 141 139 139  K 3.5  3.5 3.7  CL 99 102 99  CO2 26  --  26  GLUCOSE 121* 119* 122*  BUN 24* 29* 22  CREATININE 1.42* 1.40* 1.21*  CALCIUM  8.7*  --  8.5*   GFR: Estimated Creatinine Clearance: 41.2 mL/min (A) (by C-G formula based on SCr of 1.21 mg/dL (H)). Liver Function Tests: Recent Labs  Lab 07/30/24 1042  AST 24  ALT 15  ALKPHOS 68  BILITOT 0.7  PROT 6.8  ALBUMIN  3.2*   No results for input(s): LIPASE, AMYLASE in the last 168 hours. No results for input(s): AMMONIA in the last 168 hours. Coagulation Profile: Recent Labs  Lab 07/30/24 1603  INR 1.3*   Cardiac Enzymes: Recent Labs  Lab 07/30/24 1042  CKTOTAL 49   BNP (last 3 results) No results for input(s): PROBNP in the last 8760 hours. HbA1C: No results for input(s): HGBA1C in the last 72 hours. CBG: Recent Labs  Lab 07/30/24 1041  GLUCAP 121*   Lipid Profile: No results for input(s): CHOL, HDL, LDLCALC, TRIG, CHOLHDL, LDLDIRECT in the last 72 hours. Thyroid  Function Tests: No results for input(s): TSH, T4TOTAL, FREET4, T3FREE, THYROIDAB in the last 72 hours. Anemia Panel: No results for input(s): VITAMINB12, FOLATE, FERRITIN, TIBC, IRON, RETICCTPCT in the last 72 hours. Sepsis Labs: No results for input(s): PROCALCITON, LATICACIDVEN in the last 168 hours.  No results found for this or any previous visit (from the past 240 hours).   Radiology Studies: DG Thoracic Spine 2 View Result Date: 07/30/2024 CLINICAL DATA:  Fall. EXAM: THORACIC SPINE 2 VIEWS COMPARISON:  Chest CTA 06/30/2024 FINDINGS: Right chest Port-A-Cath. Bone cement in the L2 vertebral body. Alignment in the thoracic spine is not significantly changed. No significant new compression deformity but the bone detail is limited. In particular, the patient has known disease involving the T6 vertebral body but this is poorly characterized on this examination. IMPRESSION: 1. No significant change in the alignment of the  thoracic spine. 2. No significant new compression deformity but the bone detail is limited. In particular, the patient has known disease involving the T6 vertebral body. If there is high clinical concern for an acute injury, consider further characterization with CT or MRI. Electronically Signed   By: Juliene Balder M.D.   On: 07/30/2024 12:29   DG Lumbar Spine Complete Result Date: 07/30/2024 CLINICAL DATA:  Fall. EXAM: LUMBAR SPINE - COMPLETE 4+ VIEW COMPARISON:  CT abdomen pelvis 12/18/2023 FINDINGS: Stable alignment in the lumbar spine. Again noted is bone cement involving the L2 vertebral body. The vertebral body heights are maintained compared to the previous examination. No evidence for an acute fracture. Multilevel degenerative endplate changes in lumbar spine. IMPRESSION: 1. No acute abnormality in the lumbar spine. 2. Previous vertebral body augmentation at L2. 3. Multilevel degenerative changes. Electronically Signed   By: Juliene Balder M.D.   On: 07/30/2024 12:26   DG Chest Portable 1 View Result Date: 07/30/2024 CLINICAL DATA:  Trauma.  Unwitnessed fall.  History of lung cancer. EXAM: PORTABLE CHEST 1 VIEW COMPARISON:  04/18/2023 FINDINGS: Right jugular Port-A-Cath with the tip  near the superior cavoatrial junction. Volume loss in the right hemithorax with patchy opacities in the mid and lower right chest. Right lung findings are similar to the previous examination. Again noted are surgical clips in the right axillary region. Left lung is clear with mild blunting at the left costophrenic angle. Cannot exclude a small left pleural effusion. Heart size is within normal limits and stable. Atherosclerotic calcifications at the aortic arch. Negative for a pneumothorax. No gross abnormality to the bony thorax. IMPRESSION: 1. Chronic changes in the right lung and right hemithorax. Findings compatible with history of lung cancer. 2. Question a small left pleural effusion. 3. Port-A-Cath as described.  Electronically Signed   By: Juliene Balder M.D.   On: 07/30/2024 11:34   CT Head Wo Contrast Result Date: 07/30/2024 EXAM: CT HEAD AND CERVICAL SPINE 07/30/2024 11:02:06 AM TECHNIQUE: CT of the head and cervical spine was performed without the administration of intravenous contrast. Multiplanar reformatted images are provided for review. Automated exposure control, iterative reconstruction, and/or weight based adjustment of the mA/kV was utilized to reduce the radiation dose to as low as reasonably achievable. COMPARISON: Head CT 07/22/2024 and MRI 07/25/2024. CT cervical spine 09/13/2022. CLINICAL HISTORY: Head trauma, suspected intracranial arterial injury, unwitnessed fall, recent brain metastases, neck/back pain. FINDINGS: CT HEAD BRAIN AND VENTRICLES: There is no evidence of an acute large territory cortically based infarct, intracranial hemorrhage, midline shift, hydrocephalus, or extra-axial fluid collection. Confluent hypodensity in the white matter of the posterior left frontal lobe has mildly progressed and most resembles vasogenic edema without significant mass effect. Multiple known enhancing lesions shown on the prior MRI concerning for metastases are not well shown on this unenhanced CT. There is a background of moderate chronic small vessel ischemic disease in the cerebral white matter. There is mild cerebral atrophy. A small chronic cortical infarct is again noted in the right parietal lobe. ORBITS: No acute abnormality. SINUSES AND MASTOIDS: Chronic right maxillary sinusitis. Clear mastoid air cells. SOFT TISSUES AND SKULL: Small, partially lucent lesion in the left frontal skull corresponding to an enhancing lesion described on MRI. No acute skull fracture. No acute soft tissue abnormality. CT CERVICAL SPINE BONES AND ALIGNMENT: Chronic trace anterolisthesis of C4 on C5. No acute fracture or suspicious lesion. DEGENERATIVE CHANGES: Mild cervical spondylosis. Multilevel facet arthrosis,  asymmetrically advanced on the right at C2-C3 and on the left at C4-C5 with mild to moderate left neural foraminal stenosis at the latter. No evidence of high grade spinal canal stenosis. SOFT TISSUES: No prevertebral soft tissue swelling. LUNG APICES: Mild scarring in the lung apices. IMPRESSION: 1. No evidence of acute intracranial or cervical spine injury. 2. Known intracranial metastases with mildly progressed edema in the posterior left frontal lobe. No significant mass effect. Electronically signed by: Dasie Hamburg MD 07/30/2024 11:31 AM EDT RP Workstation: [id number]   CT Cervical Spine Wo Contrast Result Date: 07/30/2024 EXAM: CT HEAD AND CERVICAL SPINE 07/30/2024 11:02:06 AM TECHNIQUE: CT of the head and cervical spine was performed without the administration of intravenous contrast. Multiplanar reformatted images are provided for review. Automated exposure control, iterative reconstruction, and/or weight based adjustment of the mA/kV was utilized to reduce the radiation dose to as low as reasonably achievable. COMPARISON: Head CT 07/22/2024 and MRI 07/25/2024. CT cervical spine 09/13/2022. CLINICAL HISTORY: Head trauma, suspected intracranial arterial injury, unwitnessed fall, recent brain metastases, neck/back pain. FINDINGS: CT HEAD BRAIN AND VENTRICLES: There is no evidence of an acute large territory cortically based infarct, intracranial hemorrhage,  midline shift, hydrocephalus, or extra-axial fluid collection. Confluent hypodensity in the white matter of the posterior left frontal lobe has mildly progressed and most resembles vasogenic edema without significant mass effect. Multiple known enhancing lesions shown on the prior MRI concerning for metastases are not well shown on this unenhanced CT. There is a background of moderate chronic small vessel ischemic disease in the cerebral white matter. There is mild cerebral atrophy. A small chronic cortical infarct is again noted in the right parietal  lobe. ORBITS: No acute abnormality. SINUSES AND MASTOIDS: Chronic right maxillary sinusitis. Clear mastoid air cells. SOFT TISSUES AND SKULL: Small, partially lucent lesion in the left frontal skull corresponding to an enhancing lesion described on MRI. No acute skull fracture. No acute soft tissue abnormality. CT CERVICAL SPINE BONES AND ALIGNMENT: Chronic trace anterolisthesis of C4 on C5. No acute fracture or suspicious lesion. DEGENERATIVE CHANGES: Mild cervical spondylosis. Multilevel facet arthrosis, asymmetrically advanced on the right at C2-C3 and on the left at C4-C5 with mild to moderate left neural foraminal stenosis at the latter. No evidence of high grade spinal canal stenosis. SOFT TISSUES: No prevertebral soft tissue swelling. LUNG APICES: Mild scarring in the lung apices. IMPRESSION: 1. No evidence of acute intracranial or cervical spine injury. 2. Known intracranial metastases with mildly progressed edema in the posterior left frontal lobe. No significant mass effect. Electronically signed by: Dasie Hamburg MD 07/30/2024 11:31 AM EDT RP Workstation: [id number]   DG Abd Portable 1 View Result Date: 07/30/2024 CLINICAL DATA:  Trauma.  Un witnessed fall. EXAM: PORTABLE ABDOMEN - 1 VIEW COMPARISON:  Pelvic radiograph 07/30/2024 PET-CT 04/15/2024 FINDINGS: Central line catheter tip is near the superior cavoatrial junction. Cholecystectomy clips in the right upper abdomen. Nonobstructive bowel gas pattern. Bone cement in the L2 vertebral body. Pelvic calcifications are suggestive for phleboliths. Old left ninth rib fracture. IMPRESSION: No acute abnormality. Electronically Signed   By: Juliene Balder M.D.   On: 07/30/2024 11:22   DG Pelvis Portable Result Date: 07/30/2024 CLINICAL DATA:  Trauma.  Unwitnessed fall. EXAM: PORTABLE PELVIS 1-2 VIEWS COMPARISON:  CT abdomen and pelvis 12/18/2023 FINDINGS: Pelvic bony ring is intact. No gross abnormality to either hip. Degenerative endplate changes in the  visualized lumbar spine. IMPRESSION: No acute abnormality. Electronically Signed   By: Juliene Balder M.D.   On: 07/30/2024 11:19   Scheduled Meds:  artificial tears  2 drop Both Eyes TID   dexamethasone   4 mg Oral Daily   metoprolol  tartrate  50 mg Oral TID   Continuous Infusions:   LOS: 1 day    Time spent: 50 mins    Darcel Dawley, MD [facility]   If 7PM-7AM, please contact night-coverage

## 2024-07-31 NOTE — Consult Note (Signed)
 WOC Nurse Consult Note: Reason for Consult:Stage 3 pressure injury to left buttock in the setting of metastatic non-small cell lung cancer with multiple brain metastases and new onset weakness and falls.  Family and patient have agreed on comfort care and possible Hospice House admission.   Wound type:stage 3 pressure injury Pressure Injury POA: Yes Measurement:1 cm x 0.5 cm x 0.2 cm  Wound bed:red and moist Drainage (amount, consistency, odor) minimal serosanguinous   Periwound:intact  blanchable redness to coccyx noted  Dressing procedure/placement/frequency: silicone foam dressing. To left buttock pressure injury  Change every three days and PRN soilage.   Will not follow at this time.  Please re-consult if needed.  Darice Cooley MSN, RN, FNP-BC CWON Wound, Ostomy, Continence Nurse [facility] [phone] Work cell phone:  [phone]

## 2024-07-31 NOTE — Plan of Care (Signed)
   Problem: Skin Integrity: Goal: Risk for impaired skin integrity will decrease Outcome: Progressing
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
## 2024-07-31 NOTE — TOC Initial Note (Signed)
 Transition of Care (TOC) - Initial/Assessment Note   Patient confused. Referral for residential hospice .   NCM called [name] [phone] , confined she does want her aunt evaluated for residential hospice. Her first choice is Psychologist, sport and exercise second is [facility].   [name] said palliative team to met with her today. NCM asked if she wanted to talk to palliative team prior to [facility] making referral. [name] would like referral made now.   Referral sent to [facility] with [facility].   Patient from  [place]  Patient Details  Name: Crystal [name] MRN: [id number] Date of Birth: Nov 12, 1945  Transition of Care [facility]) CM/SW Contact:    Stephane Powell Jansky, RN Phone Number: 07/31/2024, 10:51 AM  Clinical Narrative:                   Expected Discharge Plan: Hospice Medical Facility     Patient Goals and CMS Choice Patient states their goals for this hospitalization and ongoing recovery are:: patient confused Niece [name] interested in residential hospice CMS Medicare.gov Compare Post Acute Care list provided to:: Patient Represenative (must comment) Water quality scientist)        Expected Discharge Plan and Services   Discharge Planning Services: CM Consult Post Acute Care Choice: Hospice Living arrangements for the past 2 months: Apartment, Assisted Living Facility                 DME Arranged: N/A DME Agency: NA                  Prior Living Arrangements/Services Living arrangements for the past 2 months: Apartment, Assisted Living Facility Lives with:: Self Patient language and need for interpreter reviewed:: Yes        Need for Family Participation in Patient Care: Yes (Comment) Care giver support system in place?: No (comment)   Criminal Activity/Legal Involvement Pertinent to Current Situation/Hospitalization: No - Comment as needed  Activities of Daily Living   ADL Screening (condition at time of admission) Independently performs ADLs?: Yes (appropriate for  developmental age) Is the patient deaf or have difficulty hearing?: No Does the patient have difficulty seeing, even when wearing glasses/contacts?: No Does the patient have difficulty concentrating, remembering, or making decisions?: No  Permission Sought/Granted      Share Information with NAME: Niece [name] [phone]  Permission granted to share info w AGENCY: [facility]        Emotional Assessment Appearance:: Appears stated age     Orientation: : Oriented to Self Alcohol  / Substance Use: Not Applicable Psych Involvement: No (comment)  Admission diagnosis:  Recurrent falls [R29.6] Fall, initial encounter [W19.XXXA] Patient Active Problem List   Diagnosis Date Noted   Recurrent falls 07/30/2024   Coronary artery calcification seen on CT scan 11/22/2023   Spinal stenosis 11/22/2023   Acute hypoxic respiratory failure (HCC) 04/19/2023   HCAP (healthcare-associated pneumonia) 04/19/2023   Acute renal failure superimposed on stage 3a chronic kidney disease (HCC) 04/19/2023   Sterile pyuria 04/19/2023   Anemia of chronic disease 04/19/2023   History of pulmonary embolism 04/19/2023   Declining functional status 03/21/2023   Postural dizziness with presyncope 03/07/2023   Frequent PVCs 03/07/2023   Thrombocytopenia 03/07/2023   Anemia associated with chemotherapy 03/07/2023   UTI (urinary tract infection) 03/07/2023   Sinus tachycardia 03/06/2023   History of DVT (deep vein thrombosis) 03/06/2023   DNR (do not resuscitate)/DNI(Do Not Intubate) 03/06/2023   Pleural effusion, malignant (HCC) 01/13/2023   Neutropenia  12/08/2022   Recurrent right pleural effusion 11/16/2022   Pulmonary embolism (HCC) 11/12/2022   Acute hypoxemic respiratory failure (HCC) 11/12/2022   Metastatic non-small cell lung cancer (HCC) 11/12/2022   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   Rib fracture 11/12/2022   Anxiety 11/12/2022   CKD stage 3a, GFR 45-59 ml/min (HCC) - baseline SCr  0.9-1.1 11/12/2022   Chronic diastolic CHF (congestive heart failure), NYHA class 2 (HCC) 11/12/2022   Metastasis to spinal column (HCC) 10/03/2022   Primary cancer of right middle lobe of lung (HCC) 09/29/2022   Intracranial bleed (HCC) 09/13/2022   Lung mass 09/13/2022   Hypokalemia 09/13/2022   Tachycardia 08/29/2021   PICC (peripherally inserted central catheter) flush 08/29/2020   Symptomatic cholelithiasis 06/09/2020   S/P laparoscopic cholecystectomy 06/09/2020   Chest pain with moderate risk for cardiac etiology 02/14/2018   Right upper quadrant pain 02/12/2018   Tremor 02/11/2014   Non Hodgkin's lymphoma (HCC) 08/18/2013   Neck pain on left side 05/30/2013   Dizziness 05/30/2013   Moderate obesity 05/21/2013   Lung crackles 05/21/2013   Dyspnea on exertion 05/21/2013   Nonischemic cardiomyopathy (HCC)    Essential hypertension    Dyslipidemia, goal LDL below 100    PCP:  Anner Alm ORN, MD Pharmacy:   [facility] - [place], [place] - [phone] [place] [address] Phone: [phone] Fax: [phone]     Social Drivers of Health (SDOH) Social History: SDOH Screenings   Food Insecurity: No Food Insecurity (07/30/2024)  Housing: Low Risk  (07/30/2024)  Transportation Needs: No Transportation Needs (07/30/2024)  Utilities: Not At Risk (07/30/2024)  Depression (PHQ2-9): Low Risk  (07/03/2024)  Social Connections: Moderately Integrated (07/30/2024)  Tobacco Use: Low Risk  (07/30/2024)   SDOH Interventions:     Readmission Risk Interventions    04/23/2023   11:08 AM 03/09/2023   12:06 PM  Readmission Risk Prevention Plan  Transportation Screening Complete Complete  Medication Review Oceanographer) Complete Complete  PCP or Specialist appointment within 3-5 days of discharge Complete Complete  HRI or Home Care Consult Complete Complete  SW Recovery Care/Counseling Consult Complete Complete  Palliative  Care Screening Not Applicable Not Applicable  Skilled Nursing Facility Complete Not Applicable
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
## 2024-07-31 NOTE — Progress Notes (Signed)
 Critical Value received from Lab from [facility] and relayed to Abigail Chavez, NP.
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
## 2024-07-31 NOTE — Progress Notes (Signed)
 [facility] [phone] [facility]  Hospice hospital liaison note   Referral received from [facility] for family interest in [facility].     Chart currently under review, eligibility is pending at this time.    Unfortunately [name] is unable to offer a bed today.   TOC aware that liaison will follow up tomorrow or sooner if a bed becomes available.    Thank you for the opportunity to participate in this patient's care.  Eleanor Nail, LPN Hospice nurse liaison [phone]

## 2024-08-01 DIAGNOSIS — R296 Repeated falls: Secondary | ICD-10-CM | POA: Diagnosis not present

## 2024-08-01 MED ORDER — NYSTATIN 100000 UNIT/ML MT SUSP
5.0000 mL | Freq: Four times a day (QID) | OROMUCOSAL | Status: DC
  Administered 2024-08-01 – 2024-08-02 (×5): 500000 [IU] via ORAL
  Filled 2024-08-01 (×5): qty 5

## 2024-08-01 NOTE — Progress Notes (Signed)
 PROGRESS NOTE    VERSA CRATON  FMW:[id number] DOB: [birth year] DOA: 07/30/2024 PCP: Anner Alm ORN, MD   Brief Narrative:  This 78 years old female with past medical history significant of non-small cell lung cancer with multiple metastasis including the brain, hypertension, HFpEF, CKD 3B presenting from her facility after a fall. She has right-sided facial droop and right-sided weakness that has been present for around 1 month.  Family reports patient has been deteriorating over the past few weeks with falls and more forgetfulness.  Patient was admitted for further evaluation.  Palliative care is consulted.  Family is agreeable to hospice.  Assessment & Plan:   Principal Problem:   Recurrent falls Active Problems:   Essential hypertension   Metastatic non-small cell lung cancer (HCC)   Chronic diastolic CHF (congestive heart failure), NYHA class 2 (HCC)   Declining functional status  Status post mechanical fall: Progressive metastatic lung cancer: Patient with recurrent falls and worsening functional status which appears to be secondary to her progressive metastatic lung cancer.  It appears it has multiple metastases to her brain and to her spine.  Discussed with radiation oncology and they recommend palliative care approach with avoidance of radiation therapy as it provides more risks than benefits.  Patient's family understanding and agreeable to transitioning care to full comfort care.  They prefer patient to go to [facility].  Continue adequate pain control.  Oncology is following.  Eliquis  discontinued.    Metastatic non-small cell lung cancer:  Patient follows with Dr. Cloretta outpatient.  It appears recent office visit show progressive disease.  Palliative care consult placed.  Care transitioned to full comfort care.   Hypertension/HFpEF: Continue home beta-blocker.   CKDIIIb: renally dose med, avoid nephrotoxic meds.    DVT prophylaxis: SCDs Code Status:  DNR/comfort Family Communication: No family at bedside Disposition Plan:   Care now transition to full comfort care.  Patient is awaiting placement into [facility].  Consultants:  Oncology  Procedures: None Antimicrobials: None  Subjective: Patient was seen and examined at bedside.  Overnight events noted. Patient was lying comfortably on the bed,  She is alert and oriented,  following commands.    Objective: Vitals:   08/01/24 0138 08/01/24 0139 08/01/24 0656 08/01/24 1028  BP: (!) 154/79 (!) 154/79 (!) 161/93 (!) 143/88  Pulse: 75 75 78 87  Resp:  18 18 16   Temp: 98 F (36.7 C) 98 F (36.7 C) 97.7 F (36.5 C)   TempSrc: Oral Oral Oral   SpO2: 96% 96% 95% 96%  Weight:      Height:        Intake/Output Summary (Last 24 hours) at 08/01/2024 1327 Last data filed at 08/01/2024 0800 Gross per 24 hour  Intake 920 ml  Output 350 ml  Net 570 ml   Filed Weights   07/30/24 1102  Weight: 87.5 kg    Examination:  General exam: Appears calm and comfortable, not in any acute distress, frail, severely deconditioned.[name] Respiratory system: CTA Bilaterally . Respiratory effort normal. RR 18 Cardiovascular system: S1 & S2 heard, RRR. No JVD, murmurs, rubs, gallops or clicks.  Gastrointestinal system: Abdomen is non distended, soft and non tender. Normal bowel sounds heard. Central nervous system: Alert and oriented X 2. No focal neurological deficits. Extremities: No edema, no cyanosis, no clubbing. Skin: No rashes, lesions or ulcers Psychiatry: Judgement and insight appear normal. Mood & affect appropriate.   Data Reviewed: I have personally reviewed following labs and imaging studies  CBC: Recent Labs  Lab 07/30/24 1042 07/30/24 1047 07/31/24 0434  WBC 10.1  --  6.7  NEUTROABS 7.9*  --   --   HGB 14.2 15.3* 13.4  HCT 44.5 45.0 41.2  MCV 108.3*  --  105.9*  PLT 263  --  219   Basic Metabolic Panel: Recent Labs  Lab 07/30/24 1042 07/30/24 1047 07/31/24 0434   NA 141 139 139  K 3.5 3.5 3.7  CL 99 102 99  CO2 26  --  26  GLUCOSE 121* 119* 122*  BUN 24* 29* 22  CREATININE 1.42* 1.40* 1.21*  CALCIUM  8.7*  --  8.5*   GFR: Estimated Creatinine Clearance: 41.2 mL/min (A) (by C-G formula based on SCr of 1.21 mg/dL (H)). Liver Function Tests: Recent Labs  Lab 07/30/24 1042  AST 24  ALT 15  ALKPHOS 68  BILITOT 0.7  PROT 6.8  ALBUMIN  3.2*   No results for input(s): LIPASE, AMYLASE in the last 168 hours. No results for input(s): AMMONIA in the last 168 hours. Coagulation Profile: Recent Labs  Lab 07/30/24 1603  INR 1.3*   Cardiac Enzymes: Recent Labs  Lab 07/30/24 1042  CKTOTAL 49   BNP (last 3 results) No results for input(s): PROBNP in the last 8760 hours. HbA1C: No results for input(s): HGBA1C in the last 72 hours. CBG: Recent Labs  Lab 07/30/24 1041  GLUCAP 121*   Lipid Profile: No results for input(s): CHOL, HDL, LDLCALC, TRIG, CHOLHDL, LDLDIRECT in the last 72 hours. Thyroid  Function Tests: No results for input(s): TSH, T4TOTAL, FREET4, T3FREE, THYROIDAB in the last 72 hours. Anemia Panel: No results for input(s): VITAMINB12, FOLATE, FERRITIN, TIBC, IRON, RETICCTPCT in the last 72 hours. Sepsis Labs: No results for input(s): PROCALCITON, LATICACIDVEN in the last 168 hours.  No results found for this or any previous visit (from the past 240 hours).   Radiology Studies: No results found.  Scheduled Meds:  artificial tears  2 drop Both Eyes TID   metoprolol  tartrate  50 mg Oral TID   nystatin   5 mL Oral QID   Continuous Infusions:   LOS: 2 days    Time spent: 35 mins    Darcel Dawley, MD [facility]   If 7PM-7AM, please contact night-coverage

## 2024-08-01 NOTE — Plan of Care (Signed)
  Problem: Education: Goal: Knowledge of General Education information will improve Description: Including pain rating scale, medication(s)/side effects and non-pharmacologic comfort measures 08/01/2024 0700 by Lang Hickory H, LPN Outcome: Progressing 08/01/2024 0700 by Lang Hickory DEL, LPN Outcome: Progressing   Problem: Health Behavior/Discharge Planning: Goal: Ability to manage health-related needs will improve 08/01/2024 0700 by Lang Hickory DEL, LPN Outcome: Progressing 08/01/2024 0700 by Lang Hickory DEL, LPN Outcome: Progressing   Problem: Clinical Measurements: Goal: Ability to maintain clinical measurements within normal limits will improve 08/01/2024 0700 by Lang Hickory DEL, LPN Outcome: Progressing 08/01/2024 0700 by Lang Hickory DEL, LPN Outcome: Progressing   Problem: Activity: Goal: Risk for activity intolerance will decrease 08/01/2024 0700 by Lang Hickory H, LPN Outcome: Progressing 08/01/2024 0700 by Lang Hickory H, LPN Outcome: Progressing   Problem: Nutrition: Goal: Adequate nutrition will be maintained 08/01/2024 0700 by Lang Hickory H, LPN Outcome: Progressing 08/01/2024 0700 by Lang Hickory DEL, LPN Outcome: Progressing   Problem: Coping: Goal: Level of anxiety will decrease 08/01/2024 0700 by Lang Hickory DEL, LPN Outcome: Progressing 08/01/2024 0700 by Lang Hickory DEL, LPN Outcome: Progressing   Problem: Elimination: Goal: Will not experience complications related to bowel motility Outcome: Progressing

## 2024-08-01 NOTE — Plan of Care (Signed)

## 2024-08-01 NOTE — Progress Notes (Signed)
 Palliative:  HPI: 78 y.o. female  with past medical history of NSCLC with multiple mets including recent brain mets found, HTN, HFpEF, CKD stage 3b admitted on 07/30/2024 with fall at facility. Family note recent physical and cognitive decline over past few weeks.   I met this morning with Crystal [name]. No family at bedside currently. Crystal [name] is awake and watching television. I opened her blinds to help get sunlight in room and assist with orientation. She is awake and interactive. Denies any pain, discomfort, or concerns. She answers that nobody has been to visit yet this morning but when asked if Dr. Cloretta has been she says yes. She tells me she ate a good breakfast. She tells me that she is going home with her sister. She has no concerns and is hoping to go home with her sister in the near future. She is appropriate in conversation but overall very poor recall and memory.   I discussed with HCPOA [name]. [name] shares that they have decided to take [place] home with her sister, Slater. [name] worked very hard yesterday to arrange for 24/7 care for [place]. [name] is unsure of timing of discharge - this will depend on arrangements with Slater and hospice equipment for the home. Will await arrangements for transition to home with hospice. Goals are clear. [name] plans to be more available this afternoon after 3-3:30pm.   Update: I returned in the late afternoon. No family at bedside. Will follow up tomorrow to discuss further having foley at home as well as timing of discharge home with hospice.   All questions/concerns addressed. Emotional support provided. Updated attending, TOC, hospice liaison.   Exam: Awake, alert. Appropriate interactions. Forgetful. No distress. Breathing regular, unlabored. Abd soft. Moves all extremities. Generalized weakness.   Plan:  - DNR/DNI - Comfort care - Home with hospice - Avoid IV sticks and medicines - use po/SL meds if needed  40 min  [name], NP Palliative Medicine  Team Pager [phone] (Please see [email or web address] for schedule) Team Phone [phone]

## 2024-08-01 NOTE — TOC Progression Note (Signed)
 Transition of Care (TOC) - Progression Note   Spoke to patient and two sisters at bedside. Plan is for patient to discharge to Crystal [name]'s home tomorrow with hospice through [facility]. [facility] is arranging oxygen , hospital bed, bedside commode, transport chair.   Plan to discharge tomorrow to Crystal [name]'s home : [address],[place]  . Patient will need ambulance transport home. Slater would like to be called by hospital nurse when [facility] arrives to pick [place] up . NCM will leave information in hand off  Patient Details  Name: Crystal [name] MRN: [id number] Date of Birth: 1946/08/04  Transition of Care [facility]) CM/SW Contact  Asuka Dusseau, Powell Jansky, RN Phone Number: 08/01/2024, 2:43 PM  Clinical Narrative:       Expected Discharge Plan: Hospice Medical Facility                 Expected Discharge Plan and Services   Discharge Planning Services: CM Consult Post Acute Care Choice: Hospice Living arrangements for the past 2 months: Apartment, Assisted Living Facility                 DME Arranged: N/A DME Agency: NA                   Social Drivers of Health (SDOH) Interventions SDOH Screenings   Food Insecurity: No Food Insecurity (07/30/2024)  Housing: Low Risk  (07/30/2024)  Transportation Needs: No Transportation Needs (07/30/2024)  Utilities: Not At Risk (07/30/2024)  Depression (PHQ2-9): Low Risk  (07/03/2024)  Social Connections: Moderately Integrated (07/30/2024)  Tobacco Use: Low Risk  (07/30/2024)    Readmission Risk Interventions    04/23/2023   11:08 AM 03/09/2023   12:06 PM  Readmission Risk Prevention Plan  Transportation Screening Complete Complete  Medication Review Oceanographer) Complete Complete  PCP or Specialist appointment within 3-5 days of discharge Complete Complete  HRI or Home Care Consult Complete Complete  SW Recovery Care/Counseling Consult Complete Complete  Palliative Care Screening Not Applicable Not Applicable  Skilled Nursing  Facility Complete Not Applicable
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
## 2024-08-01 NOTE — Plan of Care (Addendum)
 Call bell within reached. No complaints of pain. Bed alarm on. Talked to NP earlier this morning and said that it is okay not to have PIV in place. Problem: Education: Goal: Knowledge of General Education information will improve Description: Including pain rating scale, medication(s)/side effects and non-pharmacologic comfort measures Outcome: Progressing   Problem: Health Behavior/Discharge Planning: Goal: Ability to manage health-related needs will improve Outcome: Progressing   Problem: Clinical Measurements: Goal: Ability to maintain clinical measurements within normal limits will improve Outcome: Progressing Goal: Will remain free from infection Outcome: Progressing Goal: Diagnostic test results will improve Outcome: Progressing Goal: Respiratory complications will improve Outcome: Progressing Goal: Cardiovascular complication will be avoided Outcome: Progressing   Problem: Activity: Goal: Risk for activity intolerance will decrease Outcome: Progressing   Problem: Nutrition: Goal: Adequate nutrition will be maintained Outcome: Progressing   Problem: Coping: Goal: Level of anxiety will decrease Outcome: Progressing   Problem: Elimination: Goal: Will not experience complications related to bowel motility Outcome: Progressing Goal: Will not experience complications related to urinary retention Outcome: Progressing   Problem: Pain Managment: Goal: General experience of comfort will improve and/or be controlled Outcome: Progressing   Problem: Safety: Goal: Ability to remain free from injury will improve Outcome: Progressing   Problem: Skin Integrity: Goal: Risk for impaired skin integrity will decrease Outcome: Progressing

## 2024-08-01 NOTE — Progress Notes (Signed)
 [facility] [phone] [facility] Liaison Note  Patient not appropriate for [facility] at this time. Family has decided on home hospice services at patient's sister Jane's home.   DME needs discussed. Patient has the following equipment in the home: wheelchair, shower chair, walker. Family requests the following equipment for delivery: oxygen , hospital bed, bedside commode, transport chair.   Please send signed and completed DNR home with patient/family. Please provide prescriptions at discharge as needed to ensure ongoing symptom management.  [facility] information and contact numbers given to [name]/POA. Please call with any concerns.  Thank you for the opportunity to participate in this patient's care.   Eleanor Nail, LPN [facility] Liaison [phone]

## 2024-08-02 DIAGNOSIS — R296 Repeated falls: Secondary | ICD-10-CM | POA: Diagnosis not present

## 2024-08-02 MED ORDER — OXYCODONE HCL 5 MG PO TABS: 5.0000 mg | ORAL_TABLET | Freq: Three times a day (TID) | ORAL | 0 refills | Status: AC | PRN

## 2024-08-02 NOTE — Progress Notes (Signed)
 Palliative:  HPI: 78 y.o. female  with past medical history of NSCLC with multiple mets including recent brain mets found, HTN, HFpEF, CKD stage 3b admitted on 07/30/2024 with fall at facility. Family note recent physical and cognitive decline over past few weeks.   I met with Crystal [name] this morning. She is feeding herself breakfast - just getting started. She is in good spirits. She denies any discomfort, pain, concerns. She is happy with plans to be discharging to her sister's home. Appropriate in conversation but poor recall.   I called and spoke with HCPOA, [name]. [name] has been in communication with her mother and grandmother. She has questions/concerns about equipment and other equipment that hospice provides vs what they will be responsible to provide at home. I will ask hospice liaison to touch base with her today to clarify. [name] shares that they have caregivers lined up through Nov 8th at this time. I discussed with her foley catheter and she confirms that they want this to assist with care and comfort in the home - order placed. [name] also asks about PT evaluation so they can be prepared on baseline of expectations and care needed - order placed. [name] agrees with d/c later today if equipment sorted, foley inserted, and PT evals although she would be more available tomorrow to assist with transitions and care.   All questions/concerns addressed. Emotional support provided. Updated hospice liaison.    Exam: Awake, alert. Appropriate interactions. Forgetful. No distress. Breathing regular, unlabored. Abd soft. Moves all extremities. Generalized weakness.    Plan:  - DNR/DNI - Comfort care - Home with hospice - Avoid IV sticks and medicines - use po/SL meds if needed  40 min  [name], NP Palliative Medicine Team Pager [phone] (Please see [email or web address] for schedule) Team Phone [phone]

## 2024-08-02 NOTE — Care Plan (Signed)
 Foley inserted per order. Patient educated and aware. Peri-care performed prior to inserting foley. Patient tolerated well

## 2024-08-02 NOTE — Discharge Summary (Signed)
 Physician Discharge Summary  Crystal [name] FMW:[id number] DOB: [birth year] DOA: 07/30/2024  PCP: Anner Alm ORN, MD  Admit date: 07/30/2024  Discharge date: 08/02/2024  Admitted From: Home  Disposition:  Home hospice.  Recommendations for Outpatient Follow-up:  Patient is being discharged to  home hospice for comfort care.  Home Health:None Equipment/Devices:NOne  Discharge Condition: Stable CODE STATUS:DNR Diet recommendation: Heart Healthy   Brief [facility] Course: This 78 years old female with past medical history significant of non-small cell lung cancer with multiple metastasis including the brain, hypertension, HFpEF, CKD 3B presenting from her facility after a fall. She has right-sided facial droop and right-sided weakness that has been present for around 1 month.  Family reports patient has been deteriorating over the past few weeks with falls and more forgetfulness.  Patient was admitted for further evaluation.  Palliative care is consulted.  Family is agreeable to hospice.  Patient is agreeable to go to home hospice.  Discharge Diagnoses:  Principal Problem:   Recurrent falls Active Problems:   Essential hypertension   Metastatic non-small cell lung cancer (HCC)   Chronic diastolic CHF (congestive heart failure), NYHA class 2 (HCC)   Declining functional status  Status post mechanical fall: Progressive metastatic lung cancer: Patient with recurrent falls and worsening functional status which appears to be secondary to her progressive metastatic lung cancer.  It appears it has multiple metastases to her brain and to her spine.  Discussed with radiation oncology and they recommend palliative care approach with avoidance of radiation therapy as it provides more risks than benefits.  Patient's family understanding and agreeable to transitioning care to full comfort care.  They prefer patient to go to [facility].  Continue adequate pain control.  Oncology is  following.  Eliquis  discontinued.     Metastatic non-small cell lung cancer:  Patient follows with Dr. Cloretta outpatient.  It appears recent office visit show progressive disease.   Palliative care consult placed.  Care transitioned to full comfort care.   Hypertension/HFpEF: Continue home beta-blocker.   CKDIIIb: renally dose med, avoid nephrotoxic meds.   Discharge Instructions  Discharge Instructions     Call MD for:  difficulty breathing, headache or visual disturbances   Complete by: As directed    Call MD for:  severe uncontrolled pain   Complete by: As directed    Diet - low sodium heart healthy   Complete by: As directed    Diet general   Complete by: As directed    Discharge instructions   Complete by: As directed    Advised to follow-up with primary care physician in 1 week. Patient being discharged to home hospice for comfort care.   Increase activity slowly   Complete by: As directed    No wound care   Complete by: As directed       Allergies as of 08/02/2024       Reactions   Simvastatin Other (See Comments)   Leg cramps   Zoloft [sertraline] Nausea Only   Codeine Other (See Comments)   Bad Headaches   Coreg Other (See Comments)   Made my legs hurt   Lisinopril Cough   Tizanidine Hcl Rash, Other (See Comments)   Hypotension, also        Medication List     STOP taking these medications    dexamethasone  4 MG tablet Commonly known as: DECADRON    lidocaine -prilocaine  cream Commonly known as: EMLA    loteprednol  0.5 % ophthalmic suspension Commonly known as:  LOTEMAX    magic mouthwash (nystatin , diphenhydrAMINE , alum & mag hydroxide) suspension mixture   Systane 0.4-0.3 % Soln Generic drug: Polyethyl Glycol-Propyl Glycol       TAKE these medications    apixaban  5 MG Tabs tablet Commonly known as: Eliquis  Take 1 tablet (5 mg total) by mouth 2 (two) times daily.   ferrous sulfate  325 (65 FE) MG tablet Take 1 tablet (325 mg  total) by mouth daily with lunch.   folic acid  1 MG tablet Commonly known as: FOLVITE  Take 1 tablet (1 mg total) by mouth daily.   LORazepam  0.5 MG tablet Commonly known as: ATIVAN  Take 1 tablet (0.5 mg total) by mouth 2 (two) times daily as needed for anxiety.   magnesium  oxide 400 (240 Mg) MG tablet Commonly known as: MAG-OX Take 1 tablet (400 mg total) by mouth 2 (two) times daily.   metoprolol  tartrate 50 MG tablet Commonly known as: LOPRESSOR  Take 1 tablet (50 mg total) by mouth 3 (three) times daily.   ondansetron  4 MG tablet Commonly known as: ZOFRAN  Take 1 tablet (4 mg total) by mouth every 6 (six) hours as needed.   oxyCODONE  5 MG immediate release tablet Commonly known as: Roxicodone  Take 1 tablet (5 mg total) by mouth every 8 (eight) hours as needed for up to 5 days. What changed:  when to take this reasons to take this   potassium chloride  SA 20 MEQ tablet Commonly known as: KLOR-CON  M Take 1 tablet (20 mEq total) by mouth daily.   Reguloid 400 MG Caps Generic drug: Psyllium Take 400 mg by mouth daily.        Follow-up Information     Anner Alm ORN, MD Follow up in 1 week(s).   Specialty: Cardiology Contact information: [address] [place] [address] [phone]                Allergies  Allergen Reactions   Simvastatin Other (See Comments)    Leg cramps   Zoloft [Sertraline] Nausea Only   Codeine Other (See Comments)    Bad Headaches   Coreg Other (See Comments)    Made my legs hurt   Lisinopril Cough   Tizanidine Hcl Rash and Other (See Comments)    Hypotension, also    Consultations: Palliative care   Procedures/Studies: DG Thoracic Spine 2 View Result Date: 07/30/2024 CLINICAL DATA:  Fall. EXAM: THORACIC SPINE 2 VIEWS COMPARISON:  Chest CTA 06/30/2024 FINDINGS: Right chest Port-A-Cath. Bone cement in the L2 vertebral body. Alignment in the thoracic spine is not significantly changed. No significant new  compression deformity but the bone detail is limited. In particular, the patient has known disease involving the T6 vertebral body but this is poorly characterized on this examination. IMPRESSION: 1. No significant change in the alignment of the thoracic spine. 2. No significant new compression deformity but the bone detail is limited. In particular, the patient has known disease involving the T6 vertebral body. If there is high clinical concern for an acute injury, consider further characterization with CT or MRI. Electronically Signed   By: Juliene Balder M.D.   On: 07/30/2024 12:29   DG Lumbar Spine Complete Result Date: 07/30/2024 CLINICAL DATA:  Fall. EXAM: LUMBAR SPINE - COMPLETE 4+ VIEW COMPARISON:  CT abdomen pelvis 12/18/2023 FINDINGS: Stable alignment in the lumbar spine. Again noted is bone cement involving the L2 vertebral body. The vertebral body heights are maintained compared to the previous examination. No evidence for an acute fracture. Multilevel degenerative  endplate changes in lumbar spine. IMPRESSION: 1. No acute abnormality in the lumbar spine. 2. Previous vertebral body augmentation at L2. 3. Multilevel degenerative changes. Electronically Signed   By: Juliene Balder M.D.   On: 07/30/2024 12:26   DG Chest Portable 1 View Result Date: 07/30/2024 CLINICAL DATA:  Trauma.  Unwitnessed fall.  History of lung cancer. EXAM: PORTABLE CHEST 1 VIEW COMPARISON:  04/18/2023 FINDINGS: Right jugular Port-A-Cath with the tip near the superior cavoatrial junction. Volume loss in the right hemithorax with patchy opacities in the mid and lower right chest. Right lung findings are similar to the previous examination. Again noted are surgical clips in the right axillary region. Left lung is clear with mild blunting at the left costophrenic angle. Cannot exclude a small left pleural effusion. Heart size is within normal limits and stable. Atherosclerotic calcifications at the aortic arch. Negative for a  pneumothorax. No gross abnormality to the bony thorax. IMPRESSION: 1. Chronic changes in the right lung and right hemithorax. Findings compatible with history of lung cancer. 2. Question a small left pleural effusion. 3. Port-A-Cath as described. Electronically Signed   By: Juliene Balder M.D.   On: 07/30/2024 11:34   CT Head Wo Contrast Result Date: 07/30/2024 EXAM: CT HEAD AND CERVICAL SPINE 07/30/2024 11:02:06 AM TECHNIQUE: CT of the head and cervical spine was performed without the administration of intravenous contrast. Multiplanar reformatted images are provided for review. Automated exposure control, iterative reconstruction, and/or weight based adjustment of the mA/kV was utilized to reduce the radiation dose to as low as reasonably achievable. COMPARISON: Head CT 07/22/2024 and MRI 07/25/2024. CT cervical spine 09/13/2022. CLINICAL HISTORY: Head trauma, suspected intracranial arterial injury, unwitnessed fall, recent brain metastases, neck/back pain. FINDINGS: CT HEAD BRAIN AND VENTRICLES: There is no evidence of an acute large territory cortically based infarct, intracranial hemorrhage, midline shift, hydrocephalus, or extra-axial fluid collection. Confluent hypodensity in the white matter of the posterior left frontal lobe has mildly progressed and most resembles vasogenic edema without significant mass effect. Multiple known enhancing lesions shown on the prior MRI concerning for metastases are not well shown on this unenhanced CT. There is a background of moderate chronic small vessel ischemic disease in the cerebral white matter. There is mild cerebral atrophy. A small chronic cortical infarct is again noted in the right parietal lobe. ORBITS: No acute abnormality. SINUSES AND MASTOIDS: Chronic right maxillary sinusitis. Clear mastoid air cells. SOFT TISSUES AND SKULL: Small, partially lucent lesion in the left frontal skull corresponding to an enhancing lesion described on MRI. No acute skull fracture.  No acute soft tissue abnormality. CT CERVICAL SPINE BONES AND ALIGNMENT: Chronic trace anterolisthesis of C4 on C5. No acute fracture or suspicious lesion. DEGENERATIVE CHANGES: Mild cervical spondylosis. Multilevel facet arthrosis, asymmetrically advanced on the right at C2-C3 and on the left at C4-C5 with mild to moderate left neural foraminal stenosis at the latter. No evidence of high grade spinal canal stenosis. SOFT TISSUES: No prevertebral soft tissue swelling. LUNG APICES: Mild scarring in the lung apices. IMPRESSION: 1. No evidence of acute intracranial or cervical spine injury. 2. Known intracranial metastases with mildly progressed edema in the posterior left frontal lobe. No significant mass effect. Electronically signed by: Dasie Hamburg MD 07/30/2024 11:31 AM EDT RP Workstation: [id number]   CT Cervical Spine Wo Contrast Result Date: 07/30/2024 EXAM: CT HEAD AND CERVICAL SPINE 07/30/2024 11:02:06 AM TECHNIQUE: CT of the head and cervical spine was performed without the administration of intravenous contrast. Multiplanar reformatted  images are provided for review. Automated exposure control, iterative reconstruction, and/or weight based adjustment of the mA/kV was utilized to reduce the radiation dose to as low as reasonably achievable. COMPARISON: Head CT 07/22/2024 and MRI 07/25/2024. CT cervical spine 09/13/2022. CLINICAL HISTORY: Head trauma, suspected intracranial arterial injury, unwitnessed fall, recent brain metastases, neck/back pain. FINDINGS: CT HEAD BRAIN AND VENTRICLES: There is no evidence of an acute large territory cortically based infarct, intracranial hemorrhage, midline shift, hydrocephalus, or extra-axial fluid collection. Confluent hypodensity in the white matter of the posterior left frontal lobe has mildly progressed and most resembles vasogenic edema without significant mass effect. Multiple known enhancing lesions shown on the prior MRI concerning for metastases are not well  shown on this unenhanced CT. There is a background of moderate chronic small vessel ischemic disease in the cerebral white matter. There is mild cerebral atrophy. A small chronic cortical infarct is again noted in the right parietal lobe. ORBITS: No acute abnormality. SINUSES AND MASTOIDS: Chronic right maxillary sinusitis. Clear mastoid air cells. SOFT TISSUES AND SKULL: Small, partially lucent lesion in the left frontal skull corresponding to an enhancing lesion described on MRI. No acute skull fracture. No acute soft tissue abnormality. CT CERVICAL SPINE BONES AND ALIGNMENT: Chronic trace anterolisthesis of C4 on C5. No acute fracture or suspicious lesion. DEGENERATIVE CHANGES: Mild cervical spondylosis. Multilevel facet arthrosis, asymmetrically advanced on the right at C2-C3 and on the left at C4-C5 with mild to moderate left neural foraminal stenosis at the latter. No evidence of high grade spinal canal stenosis. SOFT TISSUES: No prevertebral soft tissue swelling. LUNG APICES: Mild scarring in the lung apices. IMPRESSION: 1. No evidence of acute intracranial or cervical spine injury. 2. Known intracranial metastases with mildly progressed edema in the posterior left frontal lobe. No significant mass effect. Electronically signed by: Dasie Hamburg MD 07/30/2024 11:31 AM EDT RP Workstation: [id number]   DG Abd Portable 1 View Result Date: 07/30/2024 CLINICAL DATA:  Trauma.  Un witnessed fall. EXAM: PORTABLE ABDOMEN - 1 VIEW COMPARISON:  Pelvic radiograph 07/30/2024 PET-CT 04/15/2024 FINDINGS: Central line catheter tip is near the superior cavoatrial junction. Cholecystectomy clips in the right upper abdomen. Nonobstructive bowel gas pattern. Bone cement in the L2 vertebral body. Pelvic calcifications are suggestive for phleboliths. Old left ninth rib fracture. IMPRESSION: No acute abnormality. Electronically Signed   By: Juliene Balder M.D.   On: 07/30/2024 11:22   DG Pelvis Portable Result Date:  07/30/2024 CLINICAL DATA:  Trauma.  Unwitnessed fall. EXAM: PORTABLE PELVIS 1-2 VIEWS COMPARISON:  CT abdomen and pelvis 12/18/2023 FINDINGS: Pelvic bony ring is intact. No gross abnormality to either hip. Degenerative endplate changes in the visualized lumbar spine. IMPRESSION: No acute abnormality. Electronically Signed   By: Juliene Balder M.D.   On: 07/30/2024 11:19   MR Brain W Wo Contrast Result Date: 07/25/2024 EXAM: MRI BRAIN WITH AND WITHOUT CONTRAST 07/25/2024 12:00:00 PM TECHNIQUE: Multiplanar multisequence MRI of the head/brain was performed with and without the administration of 9 mL gadobutrol (GADAVIST) 1 MMOL/ML injection. Patient is not able to hold still for MRI, best possible images obtainable. COMPARISON: CT of the head dated 07/22/2024 and MRI of the head dated 03/07/2023. CLINICAL HISTORY: FINDINGS: BRAIN AND VENTRICLES: No acute infarct. No acute intracranial hemorrhage. No mass effect or midline shift. No hydrocephalus. The sella is unremarkable. Normal flow voids. Age-related atrophy. There are confluent zones of increased T2 signal within the periventricular white matter bilaterally. There is moderate deep cerebral white matter disease and  subcortical white matter disease. There are numerous enhancing lesions within the brain, which are concerning for metastatic disease. There is a lesion within the left frontal lobe on image 91 of series 17, measuring approximately 14 x 7 x 13 mm. There is a lesion within the right parietal lobe on image 124, measuring approximately 8 x 6 mm. A lesion is present medially within the left occipital lobe on image 88, measuring approximately 5 mm in diameter. A lesion is present posteromedially within the right brachial pons on image 56, measuring approximately 5 mm in diameter. There is also abnormal enhancement along the ventral surface of the right superior cerebellar hemisphere on image 62. There is nodular enhancement at the right porus acousticus, likely  representing metastatic disease to the substernal and cannicular segments of the right seventh and eighth cranial nerves. There is also abnormal enhancement within the left internal auditory canal, also concerning for metastatic disease. ORBITS: No acute abnormality. SINUSES: No acute abnormality. BONES AND SOFT TISSUES: Focal enhancement present within the right frontal bone on image 82, which may represent osseous metastatic disease. A smaller focus of enhancement is present within the left frontal bone on image 117. No acute soft tissue abnormality. The above findings were discussed with Ms. Olam Ned NP at 12:53 pm 07/25/2024. IMPRESSION: 1. Numerous enhancing intracranial lesions, most consistent with intracranial metastatic disease. 2. Nodular enhancement at the right porus acousticus and abnormal enhancement within the left internal auditory canal, concerning for metastatic involvement of the seventh and eighth cranial nerves. 3. Focal enhancement in the right frontal bone and a smaller focus in the left frontal bone, suspicious for osseous metastatic disease. 4. Findings were discussed with Ms. Olam Ned, NP at 12:53 PM on 07/25/2024. Electronically signed by: Evalene Coho MD 07/25/2024 01:07 PM EDT RP Workstation: [id number]   CT Head Wo Contrast Result Date: 07/22/2024 CLINICAL DATA:  Head trauma EXAM: CT HEAD WITHOUT CONTRAST TECHNIQUE: Contiguous axial images were obtained from the base of the skull through the vertex without intravenous contrast. RADIATION DOSE REDUCTION: This exam was performed according to the departmental dose-optimization program which includes automated exposure control, adjustment of the mA and/or kV according to patient size and/or use of iterative reconstruction technique. COMPARISON:  CT brain 07/18/2024, MRI 03/07/2023 FINDINGS: Brain: Negative for acute intracranial hemorrhage. Mild chronic small vessel ischemic changes of the white matter. Redemonstrated  asymmetrical hypodensity within the left frontal lobe white matter, and slight ex vacuo enlargement of the left frontal horn. The ventricle size appears stable compared to prior exam several days ago but might be slightly enlarged compared to the MRI from 2024. Vascular: No hyperdense vessels.  Carotid vascular calcification Skull: No fracture Sinuses/Orbits: Heterogeneous material in the right maxillary sinus with punctate calcifications. Other: None IMPRESSION: 1. Negative for acute intracranial hemorrhage. 2. Short-term stable appearance of asymmetrical hypodensity within the left frontal lobe, which could be sequela of prior infarction, but given change in appearance compared with MRI from 2024 and history of metastatic lung cancer, consider correlation with MRI to confirm 3. Ventricles appear slightly enlarged, this is a stable finding compared with the head CT several days prior but appears slightly more prominent compared to the MRI from May 2024 Electronically Signed   By: Luke Bun M.D.   On: 07/22/2024 17:33   DG Foot Complete Right Result Date: 07/22/2024 CLINICAL DATA:  Bruising after fall EXAM: RIGHT FOOT COMPLETE - 3+ VIEW COMPARISON:  None Available. FINDINGS: No definitive acute fracture or malalignment. Benign-appearing  smooth sclerosis in the first distal phalanx. Mild degenerative change at the first MTP joint. IMPRESSION: No definitive acute osseous abnormality. Electronically Signed   By: Luke Bun M.D.   On: 07/22/2024 17:10   CT ANGIO HEAD W OR WO CONTRAST Result Date: 07/18/2024 EXAM: CTA Head without and with Intravenous Contrast CLINICAL HISTORY: Right-sided facial weakness. Metastatic non-small cell lung cancer (HCC). TECHNIQUE: Axial CTA images of the head without and with intravenous contrast. MIP reconstructed images were created and reviewed. Dose reduction technique was used including one or more of the following: automated exposure control, adjustment of mA and kV  according to patient size, and/or iterative reconstruction. CONTRAST: Without and with; 75 mL iohexol  (OMNIPAQUE ) 350 MG/ML injection. COMPARISON: MR head without contrast 11/06/2022. CT head without contrast 09/13/2022. FINDINGS: INTERNAL CAROTID ARTERIES: The intracranial ICAs are patent with no significant stenosis. No occlusion. No aneurysm. ANTERIOR CEREBRAL ARTERIES: No significant stenosis. No occlusion. No aneurysm. MIDDLE CEREBRAL ARTERIES: No significant stenosis. No occlusion. No aneurysm. POSTERIOR CEREBRAL ARTERIES: No significant stenosis. No occlusion. No aneurysm. BASILAR ARTERY: No significant stenosis. No occlusion. No aneurysm. VERTEBRAL ARTERIES: No significant stenosis. No occlusion. No aneurysm. SOFT TISSUES: No acute finding. No masses or lymphadenopathy. BONES: Classification of the frontal sinus is noted. No acute osseous abnormality. BRAIN PARENCHYMA: Asymmetric frontal subcortical attenuation and possible cortical infarct is noted since the prior study. This appears remote with volume loss and some ex vacuo dilation of the left lateral ventricle. IMPRESSION: 1. No evidence of significant stenosis, aneurysmal dilatation, or dissection involving the arteries of the head. 2. Asymmetric frontal subcortical attenuation and possible cortical infarct, appearing remote with volume loss and ex vacuo dilation of the left lateral ventricle. No definite mass lesion is evident. Electronically signed by: Lonni Necessary MD 07/18/2024 02:30 PM EDT RP Workstation: [id number]    Subjective: Patient was seen and examined at bedside.  Overnight events noted. Patient reports feeling better and wants to be discharged,  She is going home with home hospice.  Discharge Exam: Vitals:   08/02/24 0633 08/02/24 0923  BP: (!) 153/103 129/82  Pulse: 72 77  Resp: 18 16  Temp: 98.1 F (36.7 C) 98.1 F (36.7 C)  SpO2: 95% 96%   Vitals:   08/01/24 1810 08/01/24 2214 08/02/24 0633 08/02/24 0923  BP:  (!) 140/81 (!) 146/95 (!) 153/103 129/82  Pulse: 71 78 72 77  Resp: 16 18 18 16   Temp: 97.7 F (36.5 C) 97.6 F (36.4 C) 98.1 F (36.7 C) 98.1 F (36.7 C)  TempSrc: Oral Oral Oral Oral  SpO2: 94% 97% 95% 96%  Weight:      Height:        General: Pt is alert, awake, not in acute distress Cardiovascular: RRR, S1/S2 +, no rubs, no gallops Respiratory: CTA bilaterally, no wheezing, no rhonchi Abdominal: Soft, NT, ND, bowel sounds + Extremities: no edema, no cyanosis    The results of significant diagnostics from this hospitalization (including imaging, microbiology, ancillary and laboratory) are listed below for reference.     Microbiology: No results found for this or any previous visit (from the past 240 hours).   Labs: BNP (last 3 results) No results for input(s): BNP in the last 8760 hours. Basic Metabolic Panel: Recent Labs  Lab 07/30/24 1042 07/30/24 1047 07/31/24 0434  NA 141 139 139  K 3.5 3.5 3.7  CL 99 102 99  CO2 26  --  26  GLUCOSE 121* 119* 122*  BUN 24* 29* 22  CREATININE 1.42* 1.40* 1.21*  CALCIUM  8.7*  --  8.5*   Liver Function Tests: Recent Labs  Lab 07/30/24 1042  AST 24  ALT 15  ALKPHOS 68  BILITOT 0.7  PROT 6.8  ALBUMIN  3.2*   No results for input(s): LIPASE, AMYLASE in the last 168 hours. No results for input(s): AMMONIA in the last 168 hours. CBC: Recent Labs  Lab 07/30/24 1042 07/30/24 1047 07/31/24 0434  WBC 10.1  --  6.7  NEUTROABS 7.9*  --   --   HGB 14.2 15.3* 13.4  HCT 44.5 45.0 41.2  MCV 108.3*  --  105.9*  PLT 263  --  219   Cardiac Enzymes: Recent Labs  Lab 07/30/24 1042  CKTOTAL 49   BNP: Invalid input(s): POCBNP CBG: Recent Labs  Lab 07/30/24 1041  GLUCAP 121*   D-Dimer No results for input(s): DDIMER in the last 72 hours. Hgb A1c No results for input(s): HGBA1C in the last 72 hours. Lipid Profile No results for input(s): CHOL, HDL, LDLCALC, TRIG, CHOLHDL, LDLDIRECT in the  last 72 hours. Thyroid  function studies No results for input(s): TSH, T4TOTAL, T3FREE, THYROIDAB in the last 72 hours.  Invalid input(s): FREET3 Anemia work up No results for input(s): VITAMINB12, FOLATE, FERRITIN, TIBC, IRON, RETICCTPCT in the last 72 hours. Urinalysis    Component Value Date/Time   COLORURINE YELLOW 07/31/2024 1846   APPEARANCEUR CLEAR 07/31/2024 1846   LABSPEC 1.013 07/31/2024 1846   LABSPEC 1.020 07/16/2017 1540   PHURINE 6.0 07/31/2024 1846   GLUCOSEU NEGATIVE 07/31/2024 1846   GLUCOSEU Negative 07/16/2017 1540   HGBUR SMALL (A) 07/31/2024 1846   BILIRUBINUR NEGATIVE 07/31/2024 1846   BILIRUBINUR Negative 07/16/2017 1540   KETONESUR NEGATIVE 07/31/2024 1846   PROTEINUR NEGATIVE 07/31/2024 1846   UROBILINOGEN 0.2 07/16/2017 1540   NITRITE NEGATIVE 07/31/2024 1846   LEUKOCYTESUR NEGATIVE 07/31/2024 1846   LEUKOCYTESUR Negative 07/16/2017 1540   Sepsis Labs Recent Labs  Lab 07/30/24 1042 07/31/24 0434  WBC 10.1 6.7   Microbiology No results found for this or any previous visit (from the past 240 hours).   Time coordinating discharge: Over 30 minutes  SIGNED:   Darcel Dawley, MD  [facility] 08/02/2024, 12:19 PM Pager   If 7PM-7AM, please contact night-coverage

## 2024-08-02 NOTE — TOC Progression Note (Addendum)
 Transition of Care [facility]) - Progression Note    Patient Details  Name: Crystal [name] MRN: [id number] Date of Birth: 04/03/1946  Transition of Care [facility]) CM/SW Contact  Robynn Eileen Hoose, RN Phone Number: 08/02/2024, 10:28 AM  Clinical Narrative:  Discharge orders noted, spoke with pt sister Slater Messing. She confirmed that patient will be staying with her. She asked that staff let her know when ambulance arrives to pick patient up. This will give her time to get home.  [facility] liasion made aware of discharge, reports Niece POA [name], wanted to wait until Monday before pt discharges home. I spoke with  [name] Rummer, and she reported that her mother was the one who wants to wait until Monday. [name] also reports that pt will need an air mattress, foley and PT eval for baseline functioning status.    After family discussion, [name] reports ok for pt to be discharged this evening as long as air mattress is delivered, pt has foley placed, and PT has a chance to evaluate for pt baseline functioning status. [facility] Liaison and provider updated.   1245: PTAR forms sent to unit. PTAR pick up arranged for 3:00 PM pick up.   1530: Call received from [facility] regarding PTAR pick up time.  Call to [facility] after hours for update, 5 other calls currently in cue ahead of patient. Floor nurse and family updated.    Expected Discharge Plan: Hospice Medical Facility                 Expected Discharge Plan and Services   Discharge Planning Services: CM Consult Post Acute Care Choice: Hospice Living arrangements for the past 2 months: Apartment, Assisted Living Facility Expected Discharge Date: 08/02/24               DME Arranged: N/A DME Agency: NA                   Social Drivers of Health (SDOH) Interventions SDOH Screenings   Food Insecurity: No Food Insecurity (07/30/2024)  Housing: Low Risk  (07/30/2024)  Transportation Needs: No Transportation Needs (07/30/2024)   Utilities: Not At Risk (07/30/2024)  Depression (PHQ2-9): Low Risk  (07/03/2024)  Social Connections: Moderately Integrated (07/30/2024)  Tobacco Use: Low Risk  (07/30/2024)    Readmission Risk Interventions    04/23/2023   11:08 AM 03/09/2023   12:06 PM  Readmission Risk Prevention Plan  Transportation Screening Complete Complete  Medication Review [employer]) Complete Complete  PCP or Specialist appointment within 3-5 days of discharge Complete Complete  HRI or Home Care Consult Complete Complete  SW Recovery Care/Counseling Consult Complete Complete  Palliative Care Screening Not Applicable Not Applicable  Skilled Nursing Facility Complete Not Applicable

## 2024-08-02 NOTE — Plan of Care (Signed)

## 2024-08-02 NOTE — Evaluation (Signed)
 Physical Therapy One-Time Evaluation Patient Details Name: Crystal [name] MRN: [id number] DOB: Oct 01, 1946 Today's Date: 08/02/2024  History of Present Illness  Crystal [name] is a 78 y.o. female admitted 07/30/24 after ground level fall at facility. Pt presents with right-sided facial droop and right-sided weakness ongoing for ~1mo. Family reports a general worsening of functional status. PMHx: non-small cell lung cancer with multiple metastasis including the brain, hypertension, HFpEF, and CKD 3B.   Clinical Impression  Pt admitted with above diagnosis. Examination limited by impaired cognition, pt being a poor historian, and lack of family present. PTA, pt was residing at [facility] and required assist with functional mobility, ADLs, and IADLs. Pt currently with functional limitations due to the deficits listed below (see PT Problem List). She required maxA x1-2 for bed mobility and maxA for sit<>stand using RW. Pt demonstrated fair sitting balance able to maintain upright posture with BUE support and CGA. She has poor standing balance and quickly fatigues. Pt is limited by generalized deconditioning, pain, impaired balance, and decreased activity tolerance. Anticipate she will require +2 assist from family with bed mobility, transfers, and basic ADLs. Pt will required 24/7 supervision and physical assist.     If plan is discharge home, recommend the following: Two people to help with walking and/or transfers;A lot of help with bathing/dressing/bathroom;Assistance with cooking/housework;Assistance with feeding;Assist for transportation;Help with stairs or ramp for entrance;Supervision due to cognitive status;Direct supervision/assist for medications management;Direct supervision/assist for financial management   Can travel by private vehicle        Equipment Recommendations Hospital bed;Hoyer lift;BSC/3in1;Wheelchair (measurements PT);Wheelchair cushion (measurements PT)   Recommendations for Other Services       Functional Status Assessment Patient has had a recent decline in their functional status and/or demonstrates limited ability to make significant improvements in function in a reasonable and predictable amount of time     Precautions / Restrictions Precautions Precautions: Fall Recall of Precautions/Restrictions: Impaired Restrictions Weight Bearing Restrictions Per Provider Order: No      Mobility  Bed Mobility Overal bed mobility: Needs Assistance Bed Mobility: Rolling, Sidelying to Sit, Sit to Supine Rolling: Max assist, Used rails Sidelying to sit: Max assist, HOB elevated, Used rails   Sit to supine: Max assist, +2 for physical assistance   General bed mobility comments: Pt sat up on R side of bed with increased time. Assit to roll onto R side with aid of bed pad. Assist to bring BLE off EOB and elevate trunk with aid of bed pad. Returning to bed requiring increased assist d/t pt's fatigue. PT managed BLEs while RN controlled trunk. Repositioned using bed features and bed pad, +2 assist.    Transfers Overall transfer level: Needs assistance Equipment used: Rolling walker (2 wheels) Transfers: Sit to/from Stand Sit to Stand: From elevated surface, Max assist           General transfer comment: Pt stood from raised bed height x2. Cued proper hand placement. Educated pt on use of momentum. Powered up with maxA. She quickly fatigued in standing and demonstrated shakiness throughout body. Fair eccentric control.    Ambulation/Gait             Pre-gait activities: Pt attempted to weight shift, march in place, and/or take side steps but was unable to clear the floor and quickly fatigued in standing.    Stairs            Wheelchair Mobility     Tilt Bed    Modified Rankin (  Stroke Patients Only)       Balance Overall balance assessment: Needs assistance Sitting-balance support: Bilateral upper extremity  supported, Feet supported, No upper extremity supported Sitting balance-Leahy Scale: Fair Sitting balance - Comments: Pt required maxA to scoot fwd with use of bed pad to secure feet flat. She was able to maintain static sitting balance with CGA.   Standing balance support: Bilateral upper extremity supported, During functional activity, Reliant on assistive device for balance Standing balance-Leahy Scale: Poor Standing balance comment: Pt dependent on RW and maxA from PT.                             Pertinent Vitals/Pain Pain Assessment Pain Assessment: Faces Faces Pain Scale: Hurts little more Pain Location: Grimacing with bed mobility Pain Descriptors / Indicators: Grimacing Pain Intervention(s): Monitored during session, Limited activity within patient's tolerance, Repositioned    Home Living Family/patient expects to be discharged to:: Hospice/Palliative care                        Prior Function Prior Level of Function : Patient poor historian/Family not available;Needs assist;History of Falls (last six months)       Physical Assist : Mobility (physical);ADLs (physical) Mobility (physical): Bed mobility;Transfers;Gait ADLs (physical): Feeding;Grooming;Bathing;Dressing;Toileting;IADLs Mobility Comments: Pt reports 1+ assist from facility staff and ambulating using RW. Multiple falls lately ADLs Comments: Pt reports staff help with everything     Extremity/Trunk Assessment   Upper Extremity Assessment Upper Extremity Assessment: Generalized weakness;Right hand dominant    Lower Extremity Assessment Lower Extremity Assessment: Generalized weakness    Cervical / Trunk Assessment Cervical / Trunk Assessment: Kyphotic  Communication   Communication Communication: No apparent difficulties    Cognition Arousal: Alert Behavior During Therapy: WFL for tasks assessed/performed   PT - Cognitive impairments: No family/caregiver present to determine  baseline, History of cognitive impairments                         Following commands: Impaired Following commands impaired: Follows one step commands with increased time     Cueing Cueing Techniques: Verbal cues, Gestural cues     General Comments      Exercises     Assessment/Plan    PT Assessment Patient does not need any further PT services  PT Problem List         PT Treatment Interventions      PT Goals (Current goals can be found in the Care Plan section)  Acute Rehab PT Goals PT Goal Formulation: Patient unable to participate in goal setting    Frequency       Co-evaluation               AM-PAC PT 6 Clicks Mobility  Outcome Measure Help needed turning from your back to your side while in a flat bed without using bedrails?: A Lot Help needed moving from lying on your back to sitting on the side of a flat bed without using bedrails?: A Lot Help needed moving to and from a bed to a chair (including a wheelchair)?: A Lot Help needed standing up from a chair using your arms (e.g., wheelchair or bedside chair)?: A Lot Help needed to walk in hospital room?: Total Help needed climbing 3-5 steps with a railing? : Total 6 Click Score: 10    End of Session Equipment Utilized During Treatment: Gait belt  Activity Tolerance: Patient limited by fatigue Patient left: in bed;with call bell/phone within reach;with bed alarm set Nurse Communication: Mobility status;Need for lift equipment PT Visit Diagnosis: Muscle weakness (generalized) (M62.81);Adult, failure to thrive (R62.7);Difficulty in walking, not elsewhere classified (R26.2);Other abnormalities of gait and mobility (R26.89);Unsteadiness on feet (R26.81)    Time: 8945-8879 PT Time Calculation (min) (ACUTE ONLY): 26 min   Charges:   PT Evaluation $PT Eval Moderate Complexity: 1 Mod   PT General Charges $$ ACUTE PT VISIT: 1 Visit         Randall SAUNDERS, PT, DPT Acute Rehabilitation  Services Office: [phone] Secure Chat Preferred  Delon CHRISTELLA Callander 08/02/2024, 11:30 AM

## 2024-08-02 NOTE — Care Plan (Signed)
 Gave pt's pain pill prior to dc. Slater, pt's sister, updated prior to discharge.

## 2024-08-05 ENCOUNTER — Ambulatory Visit: Admitting: Radiation Oncology

## 2024-08-05 ENCOUNTER — Other Ambulatory Visit

## 2024-08-06 ENCOUNTER — Ambulatory Visit: Admitting: Radiation Oncology

## 2024-08-06 ENCOUNTER — Ambulatory Visit

## 2024-08-07 ENCOUNTER — Inpatient Hospital Stay: Admitting: Nurse Practitioner

## 2024-08-07 ENCOUNTER — Inpatient Hospital Stay

## 2024-08-07 ENCOUNTER — Telehealth: Payer: Self-pay | Admitting: *Deleted

## 2024-08-07 NOTE — Telephone Encounter (Signed)
 Called sister, Slater to check on how Crystal [name] is doing at home w/Hospice. Reports that Hospice is very attentive. They have 24/7 sitters to help them. She sleeps a lot, but at times will get restless and they give her some pain med and she calms down. Appreciated the call to check in.

## 2024-08-13 ENCOUNTER — Encounter: Payer: Self-pay | Admitting: Oncology

## 2024-08-13 ENCOUNTER — Other Ambulatory Visit (HOSPITAL_COMMUNITY): Payer: Self-pay

## 2024-08-23 DEATH — deceased

## 2024-11-23 DEATH — deceased
